# Patient Record
Sex: Female | Born: 1960 | Race: Black or African American | Hispanic: No | Smoking: Never smoker
Health system: Southern US, Community
[De-identification: ages and names within clinical notes are randomized; demographics above are authoritative.]

## PROBLEM LIST (undated history)

## (undated) DIAGNOSIS — I10 Essential (primary) hypertension: Secondary | ICD-10-CM

## (undated) DIAGNOSIS — M51369 Other intervertebral disc degeneration, lumbar region without mention of lumbar back pain or lower extremity pain: Secondary | ICD-10-CM

## (undated) DIAGNOSIS — E119 Type 2 diabetes mellitus without complications: Secondary | ICD-10-CM

## (undated) DIAGNOSIS — K219 Gastro-esophageal reflux disease without esophagitis: Secondary | ICD-10-CM

## (undated) DIAGNOSIS — G629 Polyneuropathy, unspecified: Secondary | ICD-10-CM

## (undated) DIAGNOSIS — M5136 Other intervertebral disc degeneration, lumbar region: Secondary | ICD-10-CM

## (undated) DIAGNOSIS — M1990 Unspecified osteoarthritis, unspecified site: Secondary | ICD-10-CM

## (undated) HISTORY — PX: COLONOSCOPY: SHX174

## (undated) HISTORY — PX: OTHER SURGICAL HISTORY: SHX169

## (undated) HISTORY — DX: Gastro-esophageal reflux disease without esophagitis: K21.9

## (undated) HISTORY — DX: Type 2 diabetes mellitus without complications: E11.9

## (undated) HISTORY — DX: Polyneuropathy, unspecified: G62.9

## (undated) HISTORY — PX: EYE SURGERY: SHX253

## (undated) HISTORY — PX: ABDOMINAL HYSTERECTOMY: SHX81

## (undated) HISTORY — DX: Essential (primary) hypertension: I10

---

## 2003-01-21 DIAGNOSIS — E119 Type 2 diabetes mellitus without complications: Secondary | ICD-10-CM | POA: Insufficient documentation

## 2003-10-23 ENCOUNTER — Ambulatory Visit: Payer: Self-pay

## 2004-11-18 ENCOUNTER — Ambulatory Visit: Payer: Self-pay

## 2005-11-21 ENCOUNTER — Ambulatory Visit: Payer: Self-pay | Admitting: Unknown Physician Specialty

## 2005-12-04 ENCOUNTER — Ambulatory Visit: Payer: Self-pay

## 2006-12-07 ENCOUNTER — Ambulatory Visit: Payer: Self-pay

## 2006-12-08 ENCOUNTER — Ambulatory Visit: Payer: Self-pay

## 2007-01-16 ENCOUNTER — Emergency Department: Payer: Self-pay | Admitting: Emergency Medicine

## 2007-01-25 ENCOUNTER — Encounter: Payer: Self-pay | Admitting: Orthopedic Surgery

## 2007-02-21 ENCOUNTER — Encounter: Payer: Self-pay | Admitting: Orthopedic Surgery

## 2007-03-02 ENCOUNTER — Ambulatory Visit: Payer: Self-pay | Admitting: Orthopedic Surgery

## 2007-03-02 ENCOUNTER — Other Ambulatory Visit: Payer: Self-pay

## 2007-03-08 ENCOUNTER — Ambulatory Visit: Payer: Self-pay | Admitting: Orthopedic Surgery

## 2007-04-06 ENCOUNTER — Encounter: Payer: Self-pay | Admitting: Orthopedic Surgery

## 2007-04-21 ENCOUNTER — Encounter: Payer: Self-pay | Admitting: Orthopedic Surgery

## 2007-05-21 ENCOUNTER — Encounter: Payer: Self-pay | Admitting: Orthopedic Surgery

## 2007-06-21 ENCOUNTER — Encounter: Payer: Self-pay | Admitting: Orthopedic Surgery

## 2007-07-21 ENCOUNTER — Encounter: Payer: Self-pay | Admitting: Orthopedic Surgery

## 2008-02-15 ENCOUNTER — Ambulatory Visit: Payer: Self-pay

## 2008-03-20 ENCOUNTER — Ambulatory Visit: Payer: Self-pay | Admitting: Internal Medicine

## 2009-11-07 ENCOUNTER — Ambulatory Visit: Payer: Self-pay | Admitting: Family Medicine

## 2010-02-21 ENCOUNTER — Ambulatory Visit: Payer: Self-pay

## 2011-02-25 ENCOUNTER — Ambulatory Visit: Payer: Self-pay

## 2012-05-14 ENCOUNTER — Ambulatory Visit: Payer: Self-pay | Admitting: Gastroenterology

## 2012-11-22 ENCOUNTER — Ambulatory Visit: Payer: Self-pay | Admitting: Physician Assistant

## 2014-01-16 DIAGNOSIS — K219 Gastro-esophageal reflux disease without esophagitis: Secondary | ICD-10-CM | POA: Insufficient documentation

## 2015-03-23 LAB — HM PAP SMEAR: HM PAP: NEGATIVE

## 2015-03-23 LAB — HM MAMMOGRAPHY: HM Mammogram: NORMAL (ref 0–4)

## 2015-04-10 ENCOUNTER — Encounter: Payer: Self-pay | Admitting: Dietician

## 2015-04-10 ENCOUNTER — Encounter: Payer: BLUE CROSS/BLUE SHIELD | Attending: Internal Medicine | Admitting: Dietician

## 2015-04-10 VITALS — BP 146/92 | Ht 59.0 in | Wt 135.6 lb

## 2015-04-10 DIAGNOSIS — E119 Type 2 diabetes mellitus without complications: Secondary | ICD-10-CM

## 2015-04-10 DIAGNOSIS — Z794 Long term (current) use of insulin: Secondary | ICD-10-CM

## 2015-04-10 NOTE — Patient Instructions (Addendum)
  Check blood sugars 4 x day before each meal and before bed every day and record Exercise: try walking  for    10-15  minutes  2-3  days a week but  ONLY if BG's <250 Avoid sugar sweetened drinks (soda, tea, coffee, sports drinks, juices) Limit intake of sweets/desserts, snack foods and fried foods Make healthy food choices Eat 3 meals day,   1-2 snacks a day Include 2-3 carbohydrate servings/meal + protein Include 1 carbohydrate serving/snack + protein Space meals 4-5 hours apart Complete 3 Day Food Record and bring to next appt Carry candy of glucose tablets at all times Carry medical alert ID  Complete a 3 day food record and bring to next appointment Make a  Dentist appointment Bring blood sugar records to the next appointment/class Call your doctor for a prescription for:  1. Meter strips (type)  Contour Next test strips checking  4 times per day  2. Lancets (type) Microlet lancets checking  4  times per day Carry fast acting glucose and a snack at all times Rotate injection sites Call [place] with BG's on 04-16-15 Return for appointment/classes on: 04-24-15

## 2015-04-10 NOTE — Progress Notes (Signed)
Diabetes Self-Management Education  Visit Type: First/Initial  Appt. Start Time: 1600 Appt. End Time: 1700  04/10/2015  [name], identified by name and date of birth, is a 55 y.o. female with a diagnosis of Diabetes: Type 2.   ASSESSMENT  Blood pressure 146/92, height 4\' 11"  (1.499 m), weight 135 lb 9.6 oz (61.508 kg). Body mass index is 27.37 kg/(m^2).      Diabetes Self-Management Education - 04/10/15 1754    Visit Information   Visit Type First/Initial   Initial Visit   Diabetes Type Type 2   Health Coping   How would you rate your overall health? Fair   Psychosocial Assessment   Patient Belief/Attitude about Diabetes Motivated to manage diabetes   Self-care barriers None   Patient Concerns Nutrition/Meal planning;Glycemic Control;Weight Control;Healthy Lifestyle  prevent complications   Preferred Learning Style Hands on   Learning Readiness Ready   What is the last grade level you completed in school? 12   Complications   Last HgB A1C per patient/outside source 12.8 %  03-29-15   How often do you check your blood sugar? 0 times/day (not testing)   Have you had a dilated eye exam in the past 12 months? Yes  03-2015   Have you had a dental exam in the past 12 months? No  5 yrs ago   Are you checking your feet? Yes   How many days per week are you checking your feet? 7   Dietary Intake   Breakfast --  eats breakfast at 9am break while at work (oatmeal or biscuit)   Snack (morning) --  eats occasional snack of chips or nabs at 8am prior to breakfast   Lunch --  eats lunch at 12p   Snack (afternoon) --  snack at [employer] --  eats supper at [employer] (evening) --  none   Beverage(s) --  Drinks occasional water and sweet tea; drinks 4-5 sugar free drinks/day   Exercise   Exercise Type ADL's  no regular exercise   Patient Education   Previous Diabetes Education Yes (please comment)  [place] about 5 yrs ago when diagnosed with  diabetes   Disease state  --  pathophysiology of type 2 diabetes and treatment options   Nutrition management  Role of diet in the treatment of diabetes and the relationship between the three main macronutrients and blood glucose level;Food label reading, portion sizes and measuring food.;Carbohydrate counting;Meal options for control of blood glucose level and chronic complications.;Effects of alcohol on blood glucose and safety factors with consumption of alcohol.;Meal timing in regards to the patients' current diabetes medication.   Physical activity and exercise  Role of exercise on diabetes management, blood pressure control and cardiac health.  advised no exercise unless BG<250 and always carry glucose tabs or candy; encouraged to start walking 10-15 min 2-3x/wk.   Medications Taught/reviewed insulin injection, site rotation, insulin storage and needle disposal.;Reviewed patients medication for diabetes, action, purpose, timing of dose and side effects.;Reviewed medication adjustment guidelines for hyperglycemia and sick days.   Monitoring Taught/evaluated SMBG meter.;Purpose and frequency of SMBG.;Identified appropriate SMBG and/or A1C goals.;Taught/discussed recording of test results and interpretation of SMBG.;Yearly dilated eye exam  gave pt Contour Next meter and instructed on its use. BG 280 (3 hr pp)   Acute complications Taught treatment of hypoglycemia - the 15 rule.;Discussed and identified patients' treatment of hyperglycemia.;Covered sick day management with medication and food.   Chronic complications Relationship between  chronic complications and blood glucose control;Assessed and discussed foot care and prevention of foot problems;Dental care;Retinopathy and reason for yearly dilated eye exams;Lipid levels, blood glucose control and heart disease;Reviewed with patient heart disease, higher risk of, and prevention   Psychosocial adjustment Role of stress on diabetes   Personal  strategies to promote health Lifestyle issues that need to be addressed for better diabetes care      Individualized Plan for Diabetes Self-Management Training:   Learning Objective:  Patient will have a greater understanding of diabetes self-management. Patient education plan is to attend individual and/or group sessions per assessed needs and concerns.   Plan:   Patient Instructions   Check blood sugars 4 x day before each meal and before bed every day and record Exercise: try walking  for    10-15  minutes  2-3  days a week but  ONLY if BG's <250 Avoid sugar sweetened drinks (soda, tea, coffee, sports drinks, juices) Limit intake of sweets/desserts, snack foods and fried foods Make healthy food choices Eat 3 meals day,   2 snacks a day in afternoon and at bedtime Include 2-3 carbohydrate servings/meal + protein Include 1 carbohydrate serving/snack + protein Space meals 4-5 hours apart Complete 3 Day Food Record and bring to next appt Carry candy of glucose tablets at all times Carry medical alert ID  Complete a 3 day food record and bring to next appointment Make a  Dentist appointment Bring blood sugar records to the next appointment/class Call your doctor for a prescription for:  1. Meter strips (type)  Contour Next test strips checking  4 times per day  2. Lancets (type) Microlet lancets checking  4  times per day Carry fast acting glucose and a snack at all times Rotate injection sites Call [place] with BG's on 04-16-15 Return for appointment/classes on: 04-24-15   Expected Outcomes:   positive  Education material provided: Diabetes and You booklet, Foot care handout, Know Your Numbers, Low BG handout, 1 tube of glucose tablets for PRN use, Contour Next meter, medical alert ID card  If problems or questions, patient to contact team via: [phone]  Future DSME appointment:  04-24-15

## 2015-04-16 ENCOUNTER — Telehealth: Payer: Self-pay | Admitting: Dietician

## 2015-04-16 NOTE — Telephone Encounter (Signed)
Called pt-pt reports she is not testing BG's but is taking insulin. Pt got prescription for the Contour Next test strips but they are expensive. Pt remembers old meter is a One Touch but it's not working. Offered to provide a One Touch 2 meter if pt prefers it and advised pt to call her insurance company to request the preferred meter.

## 2015-04-24 ENCOUNTER — Encounter: Payer: Self-pay | Admitting: Dietician

## 2015-04-24 ENCOUNTER — Encounter: Payer: BLUE CROSS/BLUE SHIELD | Attending: Internal Medicine | Admitting: Dietician

## 2015-04-24 VITALS — Ht 59.0 in | Wt 133.3 lb

## 2015-04-24 DIAGNOSIS — E119 Type 2 diabetes mellitus without complications: Secondary | ICD-10-CM | POA: Insufficient documentation

## 2015-04-24 DIAGNOSIS — Z794 Long term (current) use of insulin: Secondary | ICD-10-CM

## 2015-04-24 NOTE — Progress Notes (Signed)
Diabetes Self-Management Education  Visit Type:  Follow-up  Appt. Start Time: 1600 Appt. End Time: 1700  04/24/2015  Ms. Devinee Ting, identified by name and date of birth, is a 55 y.o. female with a diagnosis of Diabetes:  .   ASSESSMENT  Height 4\' 11"  (1.499 m), weight 133 lb 4.8 oz (60.464 kg). Body mass index is 26.91 kg/(m^2).       Diabetes Self-Management Education - [id number]    Complications   Last HgB A1C per patient/outside source 12.8 %   How often do you check your blood sugar? 3-4 times/day   Fasting Blood glucose range (mg/dL) >200  All readings before meals are in 200's-300's.   Have you had a dilated eye exam in the past 12 months? Yes   Have you had a dental exam in the past 12 months? No   Are you checking your feet? Yes   How many days per week are you checking your feet? 7   Dietary Intake   Breakfast 6:15am oatmeal or a waffle or a piece of smoked sausage   Snack (morning) yogurt or chips    Lunch 12:00 Baked chicken wings, green beans or pork chop, 1/2 cup macaroni/cheese or chicken salad sandwich, chips   Snack (afternoon) peanuts or nabs or small bag of sunchips   Dinner 8:00pm- chicken livers, fried potatoes/onions or cubed steak, 1/2 c.mashed potatoes/gravy    Beverage(s) water, sugar-free drinks   Exercise   Exercise Type ADL's  recommended not to exercise until blood glucose is lower   Patient Education   Nutrition management  Role of diet in the treatment of diabetes and the relationship between the three main macronutrients and blood glucose level;Food label reading, portion sizes and measuring food.;Carbohydrate counting;Reviewed blood glucose goals for pre and post meals and how to evaluate the patients' food intake on their blood glucose level.   Medications Reviewed patients medication for diabetes, action, purpose, timing of dose and side effects.   Outcomes   Program Status Completed      Learning Objective:  Patient will have a  greater understanding of diabetes self-management. Patient education plan is to attend individual and/or group sessions per assessed needs and concerns.   Plan:   Patient Instructions  Balance meals with 1-3 oz. Protein, 2-3 servings of carbohydrate (starch, fruit, milk/yogurt) and non-starchy vegetables. Spread 8-9 servings of carbohydrate over 3 meals and 1-2 snacks. Measure some portions of carbohydrate foods, especially starches. Read labels for carbohydrate grams remembering that 15 gms of carbohydrate equals one serving.     Expected Outcomes:  Demonstrated interest in learning. Expect positive outcomes  Education material provided: Planning a Balanced Meal; Diabetes Food Guide Plate If problems or questions, patient to contact team via:  Karolee Stamps, [place], [place]  Future DSME appointment: - PRN

## 2015-04-24 NOTE — Patient Instructions (Signed)
Balance meals with 1-3 oz. Protein, 2-3 servings of carbohydrate (starch, fruit, milk/yogurt) and non-starchy vegetables. Spread 8-9 servings of carbohydrate over 3 meals and 1-2 snacks. Measure some portions of carbohydrate foods, especially starches. Read labels for carbohydrate grams remembering that 15 gms of carbohydrate equals one serving.

## 2015-06-29 DIAGNOSIS — M792 Neuralgia and neuritis, unspecified: Secondary | ICD-10-CM | POA: Insufficient documentation

## 2015-08-10 ENCOUNTER — Other Ambulatory Visit: Payer: Self-pay | Admitting: Advanced Practice Midwife

## 2015-08-10 DIAGNOSIS — N644 Mastodynia: Secondary | ICD-10-CM

## 2015-08-23 ENCOUNTER — Encounter: Payer: Self-pay | Admitting: Radiology

## 2015-08-23 ENCOUNTER — Ambulatory Visit
Admission: RE | Admit: 2015-08-23 | Discharge: 2015-08-23 | Disposition: A | Discharge status: Home / Self Care | Payer: BLUE CROSS/BLUE SHIELD | Source: Ambulatory Visit | Attending: Advanced Practice Midwife | Admitting: Advanced Practice Midwife

## 2015-08-23 DIAGNOSIS — N644 Mastodynia: Secondary | ICD-10-CM | POA: Insufficient documentation

## 2015-10-16 DIAGNOSIS — M5432 Sciatica, left side: Secondary | ICD-10-CM | POA: Insufficient documentation

## 2015-10-16 DIAGNOSIS — G5712 Meralgia paresthetica, left lower limb: Secondary | ICD-10-CM | POA: Insufficient documentation

## 2016-04-10 ENCOUNTER — Ambulatory Visit
Admission: EM | Admit: 2016-04-10 | Discharge: 2016-04-10 | Disposition: A | Discharge status: Home / Self Care | Payer: BLUE CROSS/BLUE SHIELD | Attending: Family Medicine | Admitting: Family Medicine

## 2016-04-10 DIAGNOSIS — J0100 Acute maxillary sinusitis, unspecified: Secondary | ICD-10-CM | POA: Diagnosis not present

## 2016-04-10 DIAGNOSIS — T7840XA Allergy, unspecified, initial encounter: Secondary | ICD-10-CM

## 2016-04-10 MED ORDER — DOXYCYCLINE HYCLATE 100 MG PO CAPS: 100.0000 mg | ORAL_CAPSULE | Freq: Two times a day (BID) | ORAL | 0 refills | Status: DC

## 2016-04-10 NOTE — Discharge Instructions (Signed)
Take medication as prescribed. Rest. Drink plenty of fluids.  ° °Follow up with your primary care physician this week as needed. Return to Urgent care for new or worsening concerns.  ° °

## 2016-04-10 NOTE — ED Triage Notes (Signed)
Pt says she has a sinus infection, and some burning and pain in her left side.

## 2016-04-10 NOTE — ED Provider Notes (Signed)
[facility] ____________________________________________  Time seen: Approximately 11:54 AM  I have reviewed the triage vital signs and the nursing notes.   HISTORY  Chief Complaint Sinusitis   HPI Charlene [name] is a 56 y.o. female presenting for the complaints of 1 week of runny nose, nasal congestion, sinus pressure and postnasal drainage. Patient reports prior to the last week of symptoms, she reports she was having some nasal congestion similar to seasonal allergies. States occasionally scratchy throat, denies current sore throat. Denies fevers. Reports intermittent cough. States sinus discomfort around her cheeks and her forehead. Reports overall continues to eat and drink well as well as remain active. Reports unresolved over-the-counter cough and congestion medications. Patient reports thick drainage from nose and postnasal drainage that is green.  Denies chest pain, shortness of breath, abdominal pain, dysuria, extremity pain, extremity swelling or rash. Denies recent sickness. Denies recent antibiotic use. Patient states that she was treated with oral amoxicillin approximately 1-2 months ago for bronchitis.  PCP: [facility]   Past Medical History:  Diagnosis Date  . GERD (gastroesophageal reflux disease)   . Hypertension   Diabetes Back pain   There are no active problems to display for this patient.   History reviewed. No pertinent surgical history.   No current facility-administered medications for this encounter.   Current Outpatient Prescriptions:  .  cyclobenzaprine (FLEXERIL) 5 MG tablet, Take 1 tablet by mouth 2 (two) times daily. As needed, Disp: , Rfl:  .  gabapentin (NEURONTIN) 100 MG capsule, Take 100 mg by mouth 2 (two) times daily., Disp: , Rfl:  .  insulin aspart (NOVOLOG PENFILL) cartridge, Inject 5 Units into the skin 3 (three) times daily before meals., Disp: , Rfl:  .  Insulin Glargine (LANTUS SOLOSTAR) 100 UNIT/ML Solostar Pen, Inject 70  Units into the skin daily. At bedtime, Disp: , Rfl:  .  lisinopril-hydrochlorothiazide (PRINZIDE,ZESTORETIC) 20-12.5 MG tablet, Take 1 tablet by mouth daily., Disp: , Rfl:  .  omeprazole (PRILOSEC) 20 MG capsule, Take 1 capsule by mouth daily. As needed for acid reflux, Disp: , Rfl:  .  potassium chloride SA (K-DUR,KLOR-CON) 20 MEQ tablet, Take 1 tablet by mouth daily. As needed, Disp: , Rfl:  .  doxycycline (VIBRAMYCIN) 100 MG capsule, Take 1 capsule (100 mg total) by mouth 2 (two) times daily., Disp: 20 capsule, Rfl: 0  Allergies Patient has no known allergies.  Family History  Problem Relation Age of Onset  . Breast cancer Maternal Aunt     Social History Social History  Substance Use Topics  . Smoking status: Never Smoker  . Smokeless tobacco: Never Used  . Alcohol use 0.0 - 0.6 oz/week    Review of Systems Constitutional: No fever/chills Eyes: No visual changes. ENT: As above. Cardiovascular: Denies chest pain. Respiratory: Denies shortness of breath. Gastrointestinal: No abdominal pain.  No nausea, no vomiting.  No diarrhea.  No constipation. Genitourinary: Negative for dysuria. Musculoskeletal: Negative for back pain. Skin: Negative for rash. Neurological: Negative for headaches, focal weakness or numbness.  10-point ROS otherwise negative.  ____________________________________________   PHYSICAL EXAM:  VITAL SIGNS: ED Triage Vitals  Enc Vitals Group     BP 04/10/16 1035 (!) 169/87     Pulse Rate 04/10/16 1035 86     Resp 04/10/16 1035 18     Temp 04/10/16 1035 98 F (36.7 C)     Temp Source 04/10/16 1035 Oral     SpO2 04/10/16 1035 100 %     Weight  04/10/16 1034 125 lb (56.7 kg)     Height 04/10/16 1034 4\' 11"  (1.499 m)     Head Circumference --      Peak Flow --      Pain Score 04/10/16 1036 6     Pain Loc --      Pain Edu? --      Excl. in [place]? --     Constitutional: Alert and oriented. Well appearing and in no acute distress. Eyes: Conjunctivae  are normal. PERRL. EOMI. Head: Atraumatic.Mild tenderness to palpation bilateral maxillary sinuses, no frontal sinus tenderness to palpation.. No swelling. No erythema.   Ears: no erythema, normal TMs bilaterally.   Nose: nasal congestion with bilateral nasal turbinate erythema and edema.   Mouth/Throat: Mucous membranes are moist.  Oropharynx non-erythematous.No tonsillar swelling or exudate.  Neck: No stridor.  No cervical spine tenderness to palpation. Hematological/Lymphatic/Immunilogical: No cervical lymphadenopathy. Cardiovascular: Normal rate, regular rhythm. Grossly normal heart sounds.  Good peripheral circulation. Respiratory: Normal respiratory effort.  No retractions. No wheezes, rales or rhonchi. Good air movement.  Gastrointestinal: Soft and nontender. Musculoskeletal: No cervical, thoracic or lumbar tenderness to palpation.  Neurologic:  Normal speech and language. No gait instability. Skin:  Skin is warm, dry and intact. No rash noted. Psychiatric: Mood and affect are normal. Speech and behavior are normal.  ___________________________________________   LABS (all labs ordered are listed, but only abnormal results are displayed)  Labs Reviewed - No data to display ____________________________________________   PROCEDURES Procedures   INITIAL IMPRESSION / ASSESSMENT AND PLAN / ED COURSE  Pertinent labs & imaging results that were available during my care of the patient were reviewed by me and considered in my medical decision making (see chart for details).  Well appearing patient. No acute distress. Suspect seasonal allergies with secondary maxillary sinusitis. Encourage rest, fluids and supportive care. Discussed starting daily antihistamine like Claritin or Zyrtec. Will start patient on oral doxycycline. Discussed nasal saline rinses and avoid triggers.Discussed indication, risks and benefits of medications with patient.  Discussed follow up with Primary care  physician this week. Discussed follow up and return parameters including no resolution or any worsening concerns. Patient verbalized understanding and agreed to plan.   ____________________________________________   FINAL CLINICAL IMPRESSION(S) / ED DIAGNOSES  Final diagnoses:  Acute maxillary sinusitis, recurrence not specified  Allergic disorder, initial encounter     Discharge Medication List as of 04/10/2016 12:06 PM    START taking these medications   Details  doxycycline (VIBRAMYCIN) 100 MG capsule Take 1 capsule (100 mg total) by mouth 2 (two) times daily., Starting Thu 04/10/2016, Normal        Note: This dictation was prepared with Dragon dictation along with smaller phrase technology. Any transcriptional errors that result from this process are unintentional.         Marylene Land, NP 04/10/16 1840

## 2016-08-22 ENCOUNTER — Encounter: Payer: Self-pay | Admitting: *Deleted

## 2016-08-22 ENCOUNTER — Emergency Department
Admission: EM | Admit: 2016-08-22 | Discharge: 2016-08-22 | Disposition: A | Discharge status: Home / Self Care | Payer: Self-pay | Attending: Emergency Medicine | Admitting: Emergency Medicine

## 2016-08-22 ENCOUNTER — Emergency Department: Payer: Self-pay

## 2016-08-22 DIAGNOSIS — I10 Essential (primary) hypertension: Secondary | ICD-10-CM | POA: Insufficient documentation

## 2016-08-22 DIAGNOSIS — R109 Unspecified abdominal pain: Secondary | ICD-10-CM

## 2016-08-22 DIAGNOSIS — R739 Hyperglycemia, unspecified: Secondary | ICD-10-CM

## 2016-08-22 DIAGNOSIS — R1031 Right lower quadrant pain: Secondary | ICD-10-CM | POA: Insufficient documentation

## 2016-08-22 DIAGNOSIS — Z794 Long term (current) use of insulin: Secondary | ICD-10-CM | POA: Insufficient documentation

## 2016-08-22 DIAGNOSIS — E1165 Type 2 diabetes mellitus with hyperglycemia: Secondary | ICD-10-CM | POA: Insufficient documentation

## 2016-08-22 DIAGNOSIS — R1011 Right upper quadrant pain: Secondary | ICD-10-CM | POA: Insufficient documentation

## 2016-08-22 LAB — URINALYSIS, COMPLETE (UACMP) WITH MICROSCOPIC
BACTERIA UA: NONE SEEN
BILIRUBIN URINE: NEGATIVE
Hgb urine dipstick: NEGATIVE
Ketones, ur: NEGATIVE mg/dL
Leukocytes, UA: NEGATIVE
NITRITE: NEGATIVE
PH: 7 (ref 5.0–8.0)
Protein, ur: NEGATIVE mg/dL
SPECIFIC GRAVITY, URINE: 1.024 (ref 1.005–1.030)

## 2016-08-22 LAB — CBC
HEMATOCRIT: 41.3 % (ref 35.0–47.0)
HEMOGLOBIN: 13.7 g/dL (ref 12.0–16.0)
MCH: 29 pg (ref 26.0–34.0)
MCHC: 33.3 g/dL (ref 32.0–36.0)
MCV: 87.1 fL (ref 80.0–100.0)
Platelets: 300 10*3/uL (ref 150–440)
RBC: 4.74 MIL/uL (ref 3.80–5.20)
RDW: 13 % (ref 11.5–14.5)
WBC: 8.4 10*3/uL (ref 3.6–11.0)

## 2016-08-22 LAB — BASIC METABOLIC PANEL
ANION GAP: 12 (ref 5–15)
BUN: 8 mg/dL (ref 6–20)
CHLORIDE: 96 mmol/L — AB (ref 101–111)
CO2: 26 mmol/L (ref 22–32)
Calcium: 9.7 mg/dL (ref 8.9–10.3)
Creatinine, Ser: 0.62 mg/dL (ref 0.44–1.00)
GFR calc non Af Amer: >60 mL/min (ref >60)
GLUCOSE: 513 mg/dL — AB (ref 65–99)
Potassium: 3.4 mmol/L — ABNORMAL LOW (ref 3.5–5.1)
Sodium: 134 mmol/L — ABNORMAL LOW (ref 135–145)

## 2016-08-22 LAB — GLUCOSE, CAPILLARY
GLUCOSE-CAPILLARY: 292 mg/dL — AB (ref 65–99)
Glucose-Capillary: 435 mg/dL — ABNORMAL HIGH (ref 65–99)

## 2016-08-22 MED ORDER — SODIUM CHLORIDE 0.9 % IV BOLUS (SEPSIS)
1000.0000 mL | Freq: Once | INTRAVENOUS | Status: AC
  Administered 2016-08-22: 1000 mL via INTRAVENOUS

## 2016-08-22 MED ORDER — INSULIN ASPART 100 UNIT/ML ~~LOC~~ SOLN: 5.0000 [IU] | Freq: Once | SUBCUTANEOUS | Status: DC

## 2016-08-22 MED ORDER — SODIUM CHLORIDE 0.9 % IV BOLUS (SEPSIS): 1000.0000 mL | Freq: Once | INTRAVENOUS | Status: DC

## 2016-08-22 MED ORDER — POTASSIUM CHLORIDE CRYS ER 20 MEQ PO TBCR
40.0000 meq | EXTENDED_RELEASE_TABLET | Freq: Once | ORAL | Status: DC
  Filled 2016-08-22: qty 2

## 2016-08-22 NOTE — ED Provider Notes (Addendum)
[facility] Emergency Department Provider Note  ____________________________________________   First MD Initiated Contact with Patient 08/22/16 1832     (approximate)  I have reviewed the triage vital signs and the nursing notes.   HISTORY  Chief Complaint Flank Pain   HPI Charlene [name] is a 56 y.o. female with a history of GERD, hypertension and diabetes who is presenting with right flank pain over the past several months. She says the pain is been increasing in frequency and intensity and this is why she came to the emergency department. She denies any nausea vomiting or diarrhea. Denies any pain with urination. Says that she doesn't a history of right-sided ovarian cysts which she needed removed in the past.No history of kidney stones. Says that the pain is worse with movement. Says that the pain is an 8-910 at this time and sharp and cramping in quality. Says the pain is localized mostly to the right lower quadrant and radiates around to the right flank.  Patient says that she has her diabetes medications at home but that she has not been using them for multiple days.   Past Medical History:  Diagnosis Date  . GERD (gastroesophageal reflux disease)   . Hypertension     There are no active problems to display for this patient.   History reviewed. No pertinent surgical history.  Prior to Admission medications   Medication Sig Start Date End Date Taking? Authorizing Provider  cyclobenzaprine (FLEXERIL) 5 MG tablet Take 1 tablet by mouth 2 (two) times daily. As needed 04/17/14   [provider]  doxycycline (VIBRAMYCIN) 100 MG capsule Take 1 capsule (100 mg total) by mouth 2 (two) times daily. Patient not taking: Reported on 08/22/2016 04/10/16   Marylene Land, NP  gabapentin (NEURONTIN) 100 MG capsule Take 100 mg by mouth 2 (two) times daily.    [provider]  insulin aspart (NOVOLOG PENFILL) cartridge Inject 5 Units into the skin  3 (three) times daily before meals. 03/29/15   [provider]  Insulin Glargine (LANTUS SOLOSTAR) 100 UNIT/ML Solostar Pen Inject 70 Units into the skin daily. At bedtime 03/29/15   [provider]  lisinopril-hydrochlorothiazide (PRINZIDE,ZESTORETIC) 20-12.5 MG tablet Take 1 tablet by mouth daily. 03/29/15   [provider]  omeprazole (PRILOSEC) 20 MG capsule Take 1 capsule by mouth daily. As needed for acid reflux    [provider]  potassium chloride SA (K-DUR,KLOR-CON) 20 MEQ tablet Take 1 tablet by mouth daily. As needed    [provider]    Allergies Patient has no known allergies.  Family History  Problem Relation Age of Onset  . Breast cancer Maternal Aunt     Social History Social History  Substance Use Topics  . Smoking status: Never Smoker  . Smokeless tobacco: Never Used  . Alcohol use 0.0 - 0.6 oz/week    Review of Systems  Constitutional: No fever/chills Eyes: No visual changes. ENT: No sore throat. Cardiovascular: Denies chest pain. Respiratory: Denies shortness of breath. Gastrointestinal:  No nausea, no vomiting.  No diarrhea.  No constipation. Genitourinary: Negative for dysuria. Musculoskeletal: as above Skin: Negative for rash. Neurological: Negative for headaches, focal weakness or numbness.   ____________________________________________   PHYSICAL EXAM:  VITAL SIGNS: ED Triage Vitals [08/22/16 1731]  Enc Vitals Group     BP (!) 198/109     Pulse Rate 93     Resp 18     Temp 98.2 F (36.8 C)  Temp Source Oral     SpO2 100 %     Weight 125 lb (56.7 kg)     Height 4\' 11"  (1.499 m)     Head Circumference      Peak Flow      Pain Score 9     Pain Loc      Pain Edu?      Excl. in [place]?     Constitutional: Alert and oriented. Well appearing and in no acute distress. Eyes: Conjunctivae are normal.  Head: Atraumatic. Nose: No congestion/rhinnorhea. Mouth/Throat: Mucous membranes are moist.    Neck: No stridor.   Cardiovascular: Normal rate, regular rhythm. Grossly normal heart sounds.   Respiratory: Normal respiratory effort.  No retractions. Lungs CTAB. Gastrointestinal: Soft with mild right lower quadrant tenderness. Patient with minimal right upper quadrant tenderness with a negative Murphy sign. No distention. Mild right cva tenderness palpation. Musculoskeletal: No lower extremity tenderness nor edema.  No joint effusions. Neurologic:  Normal speech and language. No gross focal neurologic deficits are appreciated. Skin:  Skin is warm, dry and intact. No rash noted. Psychiatric: Mood and affect are normal. Speech and behavior are normal.  ____________________________________________   LABS (all labs ordered are listed, but only abnormal results are displayed)  Labs Reviewed  URINALYSIS, COMPLETE (UACMP) WITH MICROSCOPIC - Abnormal; Notable for the following:       Result Value   Color, Urine STRAW (*)    APPearance CLEAR (*)    Glucose, UA >=500 (*)    Squamous Epithelial / LPF 0-5 (*)    All other components within normal limits  BASIC METABOLIC PANEL - Abnormal; Notable for the following:    Sodium 134 (*)    Potassium 3.4 (*)    Chloride 96 (*)    Glucose, Bld 513 (*)    All other components within normal limits  GLUCOSE, CAPILLARY - Abnormal; Notable for the following:    Glucose-Capillary 435 (*)    All other components within normal limits  CBC  CBG MONITORING, ED   ____________________________________________  EKG   ____________________________________________  RADIOLOGY  Patient without any acute process on her CT of the abdomen and pelvis. ____________________________________________   PROCEDURES  Procedure(s) performed:   Procedures  Critical Care performed:   ____________________________________________   INITIAL IMPRESSION / ASSESSMENT AND PLAN / ED COURSE  Pertinent labs & imaging results that were available during my care of  the patient were reviewed by me and considered in my medical decision making (see chart for details).  ----------------------------------------- 9:05 PM on 08/22/2016 -----------------------------------------  Patient without any acute process in the CAT scan of the abdomen and pelvis. Liver lesion that may be a vascular confluence versus an area of scarring found and a follow-up MRI is recommended. Patient is resting, but this time. Repeat glucose is 292. I discussed the findings with the patient and she'll be following up with her primary care doctor for further evaluation and treatment. She says that she'll begin taking her medication at home for diabetes as we discussed extensively the risks of high blood sugar. Patient is understanding the plan and willing to comply. Also says that she will be trying a salve such as aspirin or icy hot at home for the flank pain which is worse with movement. No rashes noted. No vesicular lesions.      ____________________________________________   FINAL CLINICAL IMPRESSION(S) / ED DIAGNOSES  Abdominal pain. Flank pain. Hyperglycemia.    NEW MEDICATIONS STARTED DURING THIS VISIT:  New Prescriptions   No medications on file     Note:  This document was prepared using Dragon voice recognition software and may include unintentional dictation errors.     Orbie Pyo, MD 08/22/16 2107  Patient with ongoing pain for months. Normal blood cell count. No GI symptoms and normal-appearing appendix on the CAT scan. Unlikely to be appendicitis. Furthermore, there were no pelvic masses found. Unlikely to be ovarian torsion or cyst.   Orbie Pyo, MD 08/22/16 2108

## 2016-08-22 NOTE — ED Triage Notes (Signed)
States right flank pain that goes to her low abd for several months, states pain is now increasing, denies any urinary symptoms, awake and alert in no acute distress

## 2016-11-21 ENCOUNTER — Ambulatory Visit: Payer: Self-pay | Admitting: Obstetrics and Gynecology

## 2016-11-25 ENCOUNTER — Other Ambulatory Visit: Payer: Self-pay | Admitting: Internal Medicine

## 2016-11-25 DIAGNOSIS — R932 Abnormal findings on diagnostic imaging of liver and biliary tract: Secondary | ICD-10-CM

## 2016-12-03 ENCOUNTER — Ambulatory Visit: Payer: Self-pay | Admitting: Obstetrics and Gynecology

## 2016-12-04 ENCOUNTER — Ambulatory Visit
Admission: RE | Admit: 2016-12-04 | Discharge: 2016-12-04 | Disposition: A | Discharge status: Home / Self Care | Payer: BLUE CROSS/BLUE SHIELD | Source: Ambulatory Visit | Attending: Internal Medicine | Admitting: Internal Medicine

## 2016-12-04 DIAGNOSIS — R932 Abnormal findings on diagnostic imaging of liver and biliary tract: Secondary | ICD-10-CM | POA: Insufficient documentation

## 2016-12-04 DIAGNOSIS — K7689 Other specified diseases of liver: Secondary | ICD-10-CM | POA: Insufficient documentation

## 2016-12-04 LAB — POCT I-STAT CREATININE: CREATININE: 0.4 mg/dL — AB (ref 0.44–1.00)

## 2016-12-04 MED ORDER — GADOBENATE DIMEGLUMINE 529 MG/ML IV SOLN
11.0000 mL | Freq: Once | INTRAVENOUS | Status: AC | PRN
  Administered 2016-12-04: 11 mL via INTRAVENOUS

## 2017-02-24 ENCOUNTER — Other Ambulatory Visit: Payer: Self-pay | Admitting: Internal Medicine

## 2017-02-24 DIAGNOSIS — Z1231 Encounter for screening mammogram for malignant neoplasm of breast: Secondary | ICD-10-CM

## 2017-03-30 ENCOUNTER — Ambulatory Visit
Admission: RE | Admit: 2017-03-30 | Discharge: 2017-03-30 | Disposition: A | Discharge status: Home / Self Care | Payer: BLUE CROSS/BLUE SHIELD | Source: Ambulatory Visit | Attending: Internal Medicine | Admitting: Internal Medicine

## 2017-03-30 DIAGNOSIS — Z1231 Encounter for screening mammogram for malignant neoplasm of breast: Secondary | ICD-10-CM | POA: Insufficient documentation

## 2017-04-07 ENCOUNTER — Other Ambulatory Visit: Payer: Self-pay | Admitting: Orthopedic Surgery

## 2017-04-07 DIAGNOSIS — M5431 Sciatica, right side: Secondary | ICD-10-CM

## 2017-04-14 ENCOUNTER — Ambulatory Visit
Admission: RE | Admit: 2017-04-14 | Discharge: 2017-04-14 | Disposition: A | Discharge status: Home / Self Care | Payer: BLUE CROSS/BLUE SHIELD | Source: Ambulatory Visit | Attending: Orthopedic Surgery | Admitting: Orthopedic Surgery

## 2017-04-14 DIAGNOSIS — M5431 Sciatica, right side: Secondary | ICD-10-CM | POA: Diagnosis present

## 2017-04-14 DIAGNOSIS — M47816 Spondylosis without myelopathy or radiculopathy, lumbar region: Secondary | ICD-10-CM | POA: Diagnosis not present

## 2017-04-14 DIAGNOSIS — M5136 Other intervertebral disc degeneration, lumbar region: Secondary | ICD-10-CM | POA: Insufficient documentation

## 2017-05-05 ENCOUNTER — Other Ambulatory Visit: Payer: Self-pay | Admitting: Internal Medicine

## 2017-05-05 DIAGNOSIS — E1165 Type 2 diabetes mellitus with hyperglycemia: Secondary | ICD-10-CM

## 2017-05-05 DIAGNOSIS — E1140 Type 2 diabetes mellitus with diabetic neuropathy, unspecified: Secondary | ICD-10-CM | POA: Insufficient documentation

## 2017-05-05 DIAGNOSIS — IMO0002 Reserved for concepts with insufficient information to code with codable children: Secondary | ICD-10-CM | POA: Insufficient documentation

## 2017-05-08 ENCOUNTER — Other Ambulatory Visit: Payer: Self-pay | Admitting: Internal Medicine

## 2017-05-08 DIAGNOSIS — M79605 Pain in left leg: Secondary | ICD-10-CM

## 2017-05-08 DIAGNOSIS — R600 Localized edema: Secondary | ICD-10-CM

## 2017-05-18 ENCOUNTER — Ambulatory Visit
Admission: RE | Admit: 2017-05-18 | Discharge: 2017-05-18 | Disposition: A | Discharge status: Home / Self Care | Payer: BLUE CROSS/BLUE SHIELD | Source: Ambulatory Visit | Attending: Internal Medicine | Admitting: Internal Medicine

## 2017-05-18 ENCOUNTER — Ambulatory Visit: Payer: BLUE CROSS/BLUE SHIELD

## 2017-05-18 DIAGNOSIS — R600 Localized edema: Secondary | ICD-10-CM | POA: Diagnosis present

## 2017-05-18 DIAGNOSIS — M79605 Pain in left leg: Secondary | ICD-10-CM | POA: Insufficient documentation

## 2017-05-29 DIAGNOSIS — G63 Polyneuropathy in diseases classified elsewhere: Secondary | ICD-10-CM | POA: Insufficient documentation

## 2017-06-24 DIAGNOSIS — M79605 Pain in left leg: Secondary | ICD-10-CM | POA: Insufficient documentation

## 2017-07-06 DIAGNOSIS — R208 Other disturbances of skin sensation: Secondary | ICD-10-CM | POA: Insufficient documentation

## 2017-07-06 DIAGNOSIS — R29898 Other symptoms and signs involving the musculoskeletal system: Secondary | ICD-10-CM | POA: Insufficient documentation

## 2017-07-06 DIAGNOSIS — R202 Paresthesia of skin: Secondary | ICD-10-CM

## 2017-07-06 DIAGNOSIS — R200 Anesthesia of skin: Secondary | ICD-10-CM | POA: Insufficient documentation

## 2017-08-11 DIAGNOSIS — G629 Polyneuropathy, unspecified: Secondary | ICD-10-CM | POA: Insufficient documentation

## 2017-10-12 ENCOUNTER — Encounter: Payer: Self-pay | Admitting: Nurse Practitioner

## 2017-10-12 ENCOUNTER — Ambulatory Visit: Payer: BLUE CROSS/BLUE SHIELD | Attending: Nurse Practitioner | Admitting: Nurse Practitioner

## 2017-10-12 ENCOUNTER — Other Ambulatory Visit: Payer: Self-pay

## 2017-10-12 VITALS — BP 186/93 | HR 80 | Temp 98.3°F | Ht 59.0 in | Wt 115.0 lb

## 2017-10-12 DIAGNOSIS — G894 Chronic pain syndrome: Secondary | ICD-10-CM

## 2017-10-12 DIAGNOSIS — M25562 Pain in left knee: Secondary | ICD-10-CM

## 2017-10-12 DIAGNOSIS — Z789 Other specified health status: Secondary | ICD-10-CM | POA: Insufficient documentation

## 2017-10-12 DIAGNOSIS — M79604 Pain in right leg: Secondary | ICD-10-CM | POA: Insufficient documentation

## 2017-10-12 DIAGNOSIS — Z79899 Other long term (current) drug therapy: Secondary | ICD-10-CM | POA: Insufficient documentation

## 2017-10-12 DIAGNOSIS — M79672 Pain in left foot: Secondary | ICD-10-CM | POA: Diagnosis not present

## 2017-10-12 DIAGNOSIS — E1165 Type 2 diabetes mellitus with hyperglycemia: Secondary | ICD-10-CM | POA: Insufficient documentation

## 2017-10-12 DIAGNOSIS — M25561 Pain in right knee: Secondary | ICD-10-CM

## 2017-10-12 DIAGNOSIS — M5136 Other intervertebral disc degeneration, lumbar region: Secondary | ICD-10-CM | POA: Insufficient documentation

## 2017-10-12 DIAGNOSIS — G8929 Other chronic pain: Secondary | ICD-10-CM | POA: Insufficient documentation

## 2017-10-12 DIAGNOSIS — K219 Gastro-esophageal reflux disease without esophagitis: Secondary | ICD-10-CM | POA: Diagnosis not present

## 2017-10-12 DIAGNOSIS — M899 Disorder of bone, unspecified: Secondary | ICD-10-CM | POA: Insufficient documentation

## 2017-10-12 DIAGNOSIS — G629 Polyneuropathy, unspecified: Secondary | ICD-10-CM | POA: Insufficient documentation

## 2017-10-12 DIAGNOSIS — M25572 Pain in left ankle and joints of left foot: Secondary | ICD-10-CM | POA: Diagnosis not present

## 2017-10-12 DIAGNOSIS — M5442 Lumbago with sciatica, left side: Secondary | ICD-10-CM | POA: Diagnosis not present

## 2017-10-12 DIAGNOSIS — M79605 Pain in left leg: Secondary | ICD-10-CM | POA: Insufficient documentation

## 2017-10-12 DIAGNOSIS — E1142 Type 2 diabetes mellitus with diabetic polyneuropathy: Secondary | ICD-10-CM | POA: Insufficient documentation

## 2017-10-12 DIAGNOSIS — I10 Essential (primary) hypertension: Secondary | ICD-10-CM | POA: Insufficient documentation

## 2017-10-12 DIAGNOSIS — F1190 Opioid use, unspecified, uncomplicated: Secondary | ICD-10-CM

## 2017-10-12 NOTE — Patient Instructions (Signed)

## 2017-10-12 NOTE — Progress Notes (Signed)
Patient's Name: Charlene [name]  MRN: [id number]  Referring Provider: Jannifer Franklin, NP  DOB: 06-18-1960  PCP: Tracie Harrier, MD  DOS: 10/12/2017  Note by: Dionisio David NP  Service setting: Ambulatory outpatient  Specialty: Interventional Pain Management  Location: [facility] (AMB) Pain Management Facility    Patient type: New Patient    Primary Reason(s) for Visit: Initial Patient Evaluation CC: Leg Pain (left)  HPI  Charlene [name] is a 57 y.o. year old, female patient, who comes today for an initial evaluation. She has Back pain with left-sided sciatica; Burning sensation of feet; DM type 2, uncontrolled, with neuropathy ([place]); GERD without esophagitis; Hypertension; Neuropathic pain; Neuropathy; Numbness and tingling of left leg; Pain of right leg; Polyneuropathy associated with underlying disease ([place]); Chronic pain of left ankle (Primary Area of Pain); Chronic foot pain, left (Secondary Area of Pain); Chronic pain of left lower extremity (Tertiary Area of Pain) (L>R); Chronic pain of both knees (Fourth Area of Pain) (L>R); Chronic bilateral low back pain with left-sided sciatica; Chronic pain syndrome; Opiate use; Pharmacologic therapy; Disorder of skeletal system; and Problems influencing health status on their problem list.. Her primarily concern today is the Leg Pain (left)  Pain Assessment: Location: Left Leg Radiating: pain radiaties down left leg, back and front of thigh and down calf to foot and toes Onset: More than a month ago Duration: Chronic pain Quality: Throbbing, Tingling, Aching, Burning, Constant Severity: 8 /10 (subjective, self-reported pain score)  Note: Reported level is compatible with observation. Clinically the patient looks like a 2/10 A 2/10 is viewed as "Mild to Moderate" and described as noticeable and distracting. Impossible to hide from other people. More frequent flare-ups. Still possible to adapt and function close to normal. It can be very annoying and may have  occasional stronger flare-ups. With discipline, patients may get used to it and adapt. Information on the proper use of the pain scale provided to the patient today. When using our objective Pain Scale, levels between 6 and 10/10 are said to belong in an emergency room, as it progressively worsens from a 6/10, described as severely limiting, requiring emergency care not usually available at an outpatient pain management facility. At a 6/10 level, communication becomes difficult and requires great effort. Assistance to reach the emergency department may be required. Facial flushing and profuse sweating along with potentially dangerous increases in heart rate and blood pressure will be evident. Effect on ADL: limits my daily activities Timing: Constant Modifying factors: medications, rest BP: (!) 186/93  HR: 80  Onset and Duration: Sudden and Date of onset: 5 months Cause of pain: Unknown Severity: Getting worse, NAS-11 at its worse: 8/10, NAS-11 at its best: 5/10, NAS-11 now: 8/10 and NAS-11 on the average: 8/10 Timing: Morning, Afternoon and After a period of immobility Aggravating Factors: Motion, Prolonged standing and Walking Alleviating Factors: Medications and Resting Associated Problems: Day-time cramps, Night-time cramps, Numbness, Spasms, Swelling, Tingling, Pain that wakes patient up and Pain that does not allow patient to sleep Quality of Pain: Aching, Burning, Constant, Cramping, Getting longer, Shooting, Tender, Throbbing, Tingling and Tiring Previous Examinations or Tests: X-rays and Nerve conduction test Previous Treatments: Narcotic medications  The patient comes into the clinics today for the first time for a chronic pain management evaluation.  According to the patient her primary area pain is in her left ankle and foot.  She denies any previous injury.  She admits that she does have peripheral neuropathy secondary to uncontrolled diabetes.  She  admits that she has constant  swelling in her left ankle with pain.  She has burning to the bottom of her feet.  She has tingling also in the foot.  She admits that she has throbbing in the toes.  She states that sometimes it affects all the toes but the great toe is the worse.  She admits that this has been going on since of March when she stopped working.  She admits that she did see Dr. Vickki Muff on last month and he gave her an injection to her ankle which she states was not effective.  She has had recent images of her foot and ankle.    Her second third area of pain is in her legs.  She admits the left is greater than the right.  She has weakness and cramps in her left thigh.  She states that that is the front in the back.  She has had a previous nerve conduction study.  Her third area of pain is in her lower back.  She admits that this is an occasional pain.  Is any previous surgery.  She did have previous injections with Dr. Sharlet Salina she does not feel like they were effective.  She is going to start physical therapy in 1 week.  She has had recent x-rays.  Last area of pain is in her knees.  She admits that the left is greater than the right.  She has pain she denies any swelling.  She denies any previous surgery, interventional therapy.  She does admit that she is to follow-up with Dr. Rudene Christians this month.  She is unsure she has had x-rays.  Today I took the time to provide the patient with information regarding this pain practice. The patient was informed that the practice is divided into two sections: an interventional pain management section, as well as a completely separate and distinct medication management section. I explained that there are procedure days for interventional therapies, and evaluation days for follow-ups and medication management. Because of the amount of documentation required during both, they are kept separated. This means that there is the possibility that she may be scheduled for a procedure on one day, and  medication management the next. I have also informed her that because of staffing and facility limitations, this practice will no longer take patients for medication management only. To illustrate the reasons for this, I gave the patient the example of surgeons, and how inappropriate it would be to refer a patient to his/her care, just to write for the post-surgical antibiotics on a surgery done by a different surgeon.   Because interventional pain management is part of the board-certified specialty for the doctors, the patient was informed that joining this practice means that they are open to any and all interventional therapies. I made it clear that this does not mean that they will be forced to have any procedures done. What this means is that I believe interventional therapies to be essential part of the diagnosis and proper management of chronic pain conditions. Therefore, patients not interested in these interventional alternatives will be better served under the care of a different practitioner.  The patient was also made aware of my Comprehensive Pain Management Safety Guidelines where by joining this practice, they limit all of their nerve blocks and joint injections to those done by our practice, for as long as we are retained to manage their care. Historic Controlled Substance Pharmacotherapy Review  PMP and historical list of controlled substances:  Tramadol 50 mg, hydrocodone/acetaminophen 5/325 mg Highest opioid analgesic regimen found: Hydrocodone/acetaminophen 5/325 mg 1 tablet 6 times daily for 3 days (fill date 03/07/2012) hydrocodone 30 mg/day Most recent opioid analgesic: Tramadol 50 mg 1 tablet 3 times daily (last fill date 09/01/2017) tramadol 150 mg/day Current opioid analgesics: Tramadol 50 mg 1 tablet 3 times daily (last fill date 09/01/2017) tramadol 150 mg/day Highest recorded MME/day: 67m/day MME/day:15 mg/day Medications: The patient did not bring the medication(s) to the  appointment, as requested in our "New Patient Package" Pharmacodynamics: Desired effects: Analgesia: The patient reports >50% benefit. Reported improvement in function: The patient reports medication allows her to accomplish basic ADLs. Clinically meaningful improvement in function (CMIF): Sustained CMIF goals met Perceived effectiveness: Described as relatively effective, allowing for increase in activities of daily living (ADL) Undesirable effects: Side-effects or Adverse reactions: None reported Historical Monitoring: The patient  reports that she does not use drugs. List of all UDS Test(s): No results found for: MDMA, COCAINSCRNUR, PCPSCRNUR, PCPQUANT, CANNABQUANT, THCU, EKoutsList of all Serum Drug Screening Test(s):  No results found for: AMPHSCRSER, BARBSCRSER, BENZOSCRSER, COCAINSCRSER, PCPSCRSER, PCPQUANT, THCSCRSER, CANNABQUANT, OPIATESCRSER, OXYSCRSER, PROPOXSCRSER Historical Background Evaluation: [place] PDMP: Six (6) year initial data search conducted.            NAR X scores narcotics 290, sedatives 120, stimulant 0, overdose risk score 180 [place] Department of public safety, offender search: (Editor, commissioningInformation) Non-contributory Risk Assessment Profile: Aberrant behavior: None observed or detected today Risk factors for fatal opioid overdose: None identified today Fatal overdose hazard ratio (HR): Calculation deferred Non-fatal overdose hazard ratio (HR): Calculation deferred Risk of opioid abuse or dependence: 0.7-3.0% with doses ? 36 MME/day and 6.1-26% with doses ? 120 MME/day. Substance use disorder (SUD) risk level: Pending results of Medical Psychology Evaluation for SUD Opioid risk tool (ORT) (Total Score): 3  ORT Scoring interpretation table:  Score <3 = Low Risk for SUD  Score between 4-7 = Moderate Risk for SUD  Score >8 = High Risk for Opioid Abuse   PHQ-2 Depression Scale:  Total score: 0  PHQ-2 Scoring interpretation table: (Score and probability of major  depressive disorder)  Score 0 = No depression  Score 1 = 15.4% Probability  Score 2 = 21.1% Probability  Score 3 = 38.4% Probability  Score 4 = 45.5% Probability  Score 5 = 56.4% Probability  Score 6 = 78.6% Probability   PHQ-9 Depression Scale:  Total score: 0  PHQ-9 Scoring interpretation table:  Score 0-4 = No depression  Score 5-9 = Mild depression  Score 10-14 = Moderate depression  Score 15-19 = Moderately severe depression  Score 20-27 = Severe depression (2.4 times higher risk of SUD and 2.89 times higher risk of overuse)   Pharmacologic Plan: Pending ordered tests and/or consults  Meds  The patient has a current medication list which includes the following prescription(s): baclofen, duloxetine, fluticasone, gabapentin, insulin aspart, insulin glargine, lidocaine, lisinopril-hydrochlorothiazide, metformin, metoprolol succinate, omeprazole, pantoprazole, potassium chloride sa, tizanidine, and tramadol.  Current Outpatient Medications on File Prior to Visit  Medication Sig  . baclofen (LIORESAL) 10 MG tablet Take 10 mg by mouth 3 (three) times daily.  . DULoxetine (CYMBALTA) 30 MG capsule Take 30 mg by mouth daily.  . fluticasone (FLONASE) 50 MCG/ACT nasal spray Place into both nostrils 2 (two) times daily.  .Marland Kitchengabapentin (NEURONTIN) 300 MG capsule Take 300 mg by mouth 3 (three) times daily.   . insulin aspart (NOVOLOG PENFILL) cartridge Inject 15 Units into the  skin 3 (three) times daily before meals.   . Insulin Glargine (LANTUS SOLOSTAR) 100 UNIT/ML Solostar Pen Inject 80 Units into the skin daily. At bedtime  . lidocaine (LMX) 4 % cream Apply 1 application topically as needed.  Marland Kitchen lisinopril-hydrochlorothiazide (PRINZIDE,ZESTORETIC) 20-12.5 MG tablet Take 1 tablet by mouth daily.  . metFORMIN (GLUCOPHAGE) 500 MG tablet Take 2 tablets by mouth 2 (two) times daily.  . metoprolol succinate (TOPROL-XL) 25 MG 24 hr tablet Take 25 mg by mouth daily.  Marland Kitchen omeprazole (PRILOSEC) 20 MG  capsule Take 1 capsule by mouth daily. As needed for acid reflux  . pantoprazole (PROTONIX) 40 MG tablet Take 40 mg by mouth 2 (two) times daily.  . potassium chloride SA (K-DUR,KLOR-CON) 20 MEQ tablet Take 1 tablet by mouth daily. As needed  . tiZANidine (ZANAFLEX) 2 MG tablet Take by mouth every 6 (six) hours as needed for muscle spasms.  . traMADol (ULTRAM) 50 MG tablet Take by mouth every 6 (six) hours as needed.   No current facility-administered medications on file prior to visit.    Imaging Review  Lumbosacral Imaging: Lumbar MR wo contrast:  Results for orders placed during the hospital encounter of 04/14/17  MR LUMBAR SPINE WO CONTRAST   Narrative CLINICAL DATA:  Low back pain and bilateral leg pain over the last several months.  EXAM: MRI LUMBAR SPINE WITHOUT CONTRAST  TECHNIQUE: Multiplanar, multisequence MR imaging of the lumbar spine was performed. No intravenous contrast was administered.  COMPARISON:  CT 06/22/2016  FINDINGS: Segmentation:  5 lumbar type vertebral bodies.  Alignment:  Curvature convex to the right with the apex at L3.  Vertebrae:  No fracture or primary bone lesion.  Conus medullaris and cauda equina: Conus extends to the L1 level. Conus and cauda equina appear normal.  Paraspinal and other soft tissues: Negative  Disc levels:  No significant finding at L3-4 or above. Minimal non-compressive disc bulges. No stenosis of the canal or foramina.  L4-5: Disc degeneration with bulging of the disc. Facet degeneration and hypertrophy. No compressive stenosis. Some edema of the facet joints could be associated with low back pain or referred facet syndrome pain.  L5-S1: Disc degeneration and bulging of the disc, slightly more towards the right. Mild foraminal narrowing on the right but without definite neural compression. Central canal widely patent. Mild facet arthritis.  IMPRESSION: Lower lumbar degenerative disc disease and degenerative  facet disease which could be a cause of back pain or referred facet syndrome pain. Facet arthropathy is most pronounced at L4-5. No compressive stenosis is demonstrated.   Electronically Signed   By: Nelson Chimes M.D.   On: 04/15/2017 08:55   Note: Available results from prior imaging studies were reviewed.        ROS  Cardiovascular History: Abnormal heart rhythm, High blood pressure and Heart murmur Pulmonary or Respiratory History: No reported pulmonary signs or symptoms such as wheezing and difficulty taking a deep full breath (Asthma), difficulty blowing air out (Emphysema), coughing up mucus (Bronchitis), persistent dry cough, or temporary stoppage of breathing during sleep Neurological History: No reported neurological signs or symptoms such as seizures, abnormal skin sensations, urinary and/or fecal incontinence, being born with an abnormal open spine and/or a tethered spinal cord Review of Past Neurological Studies: No results found for this or any previous visit. Psychological-Psychiatric History: No reported psychological or psychiatric signs or symptoms such as difficulty sleeping, anxiety, depression, delusions or hallucinations (schizophrenial), mood swings (bipolar disorders) or suicidal ideations or attempts  Gastrointestinal History: Reflux or heatburn Genitourinary History: No reported renal or genitourinary signs or symptoms such as difficulty voiding or producing urine, peeing blood, non-functioning kidney, kidney stones, difficulty emptying the bladder, difficulty controlling the flow of urine, or chronic kidney disease Hematological History: No reported hematological signs or symptoms such as prolonged bleeding, low or poor functioning platelets, bruising or bleeding easily, hereditary bleeding problems, low energy levels due to low hemoglobin or being anemic Endocrine History: diabetes requiring insulin Rheumatologic History: No reported rheumatological signs and symptoms  such as fatigue, joint pain, tenderness, swelling, redness, heat, stiffness, decreased range of motion, with or without associated rash Musculoskeletal History: Negative for myasthenia gravis, muscular dystrophy, multiple sclerosis or malignant hyperthermia Work History: Out of work due to pain  Allergies  Ms. Haskins has No Known Allergies.  Laboratory Chemistry  Inflammation Markers No results found for: CRP, ESRSEDRATE (CRP: Acute Phase) (ESR: Chronic Phase) Renal Function Markers Lab Results  Component Value Date   BUN 8 08/22/2016   CREATININE 0.40 (L) 12/04/2016   GFRAA >60 08/22/2016   GFRNONAA >60 08/22/2016   Hepatic Function Markers No results found for: AST, ALT, ALBUMIN, ALKPHOS, HCVAB Electrolytes Lab Results  Component Value Date   NA 134 (L) 08/22/2016   K 3.4 (L) 08/22/2016   CL 96 (L) 08/22/2016   CALCIUM 9.7 08/22/2016   Neuropathy Markers No results found for: [id number] Bone Pathology Markers Lab Results  Component Value Date   CALCIUM 9.7 08/22/2016   Coagulation Parameters Lab Results  Component Value Date   PLT 300 08/22/2016   Cardiovascular Markers Lab Results  Component Value Date   HGB 13.7 08/22/2016   HCT 41.3 08/22/2016   Note: Lab results reviewed.  [place]  Drug: Ms. Trabert  reports that she does not use drugs. Alcohol:  reports that she drinks alcohol. Tobacco:  reports that she has never smoked. She has never used smokeless tobacco. Medical:  has a past medical history of Diabetes mellitus without complication ([place]), GERD (gastroesophageal reflux disease), Hypertension, Neuropathy, Polyneuropathy, and Polyneuropathy. Family: family history includes Alcohol abuse in her father; Breast cancer in her maternal aunt; Cancer in her father.  Past Surgical History:  Procedure Laterality Date  . ABDOMINAL HYSTERECTOMY    . arthroscopic rotator cuff    . COLONOSCOPY    . endosopic sinus     Active Ambulatory Problems    Diagnosis  Date Noted  . Back pain with left-sided sciatica 10/16/2015  . Burning sensation of feet 07/06/2017  . DM type 2, uncontrolled, with neuropathy ([place]) 05/05/2017  . GERD without esophagitis 01/16/2014  . Hypertension 10/12/2017  . Neuropathic pain 06/29/2015  . Neuropathy 08/11/2017  . Numbness and tingling of left leg 07/06/2017  . Pain of right leg 06/24/2017  . Polyneuropathy associated with underlying disease ([place]) 05/29/2017  . Chronic pain of left ankle (Primary Area of Pain) 10/12/2017  . Chronic foot pain, left (Secondary Area of Pain) 10/12/2017  . Chronic pain of left lower extremity [facility] Area of Pain) (L>R) 10/12/2017  . Chronic pain of both knees (Fourth Area of Pain) (L>R) 10/12/2017  . Chronic bilateral low back pain with left-sided sciatica 10/12/2017  . Chronic pain syndrome 10/12/2017  . Opiate use 10/12/2017  . Pharmacologic therapy 10/12/2017  . Disorder of skeletal system 10/12/2017  . Problems influencing health status 10/12/2017   Resolved Ambulatory Problems    Diagnosis Date Noted  . No Resolved Ambulatory Problems   Past Medical History:  Diagnosis Date  .  Diabetes mellitus without complication ([place])   . GERD (gastroesophageal reflux disease)   . Polyneuropathy   . Polyneuropathy    Constitutional Exam  General appearance: Well nourished, well developed, and well hydrated. In no apparent acute distress Vitals:   10/12/17 1037  BP: (!) 186/93  Pulse: 80  Temp: 98.3 F (36.8 C)  SpO2: 100%  Weight: 115 lb (52.2 kg)  Height: 4' 11" (1.499 m)   BMI Assessment: Estimated body mass index is 23.23 kg/m as calculated from the following:   Height as of this encounter: 4' 11" (1.499 m).   Weight as of this encounter: 115 lb (52.2 kg).  BMI interpretation table: BMI level Category Range association with higher incidence of chronic pain  <18 kg/m2 Underweight   18.5-24.9 kg/m2 Ideal body weight   25-29.9 kg/m2 Overweight Increased incidence by  20%  30-34.9 kg/m2 Obese (Class I) Increased incidence by 68%  35-39.9 kg/m2 Severe obesity (Class II) Increased incidence by 136%  >40 kg/m2 Extreme obesity (Class III) Increased incidence by 254%   BMI Readings from Last 4 Encounters:  10/12/17 23.23 kg/m  08/22/16 25.25 kg/m  04/10/16 25.25 kg/m  04/24/15 26.92 kg/m   Wt Readings from Last 4 Encounters:  10/12/17 115 lb (52.2 kg)  08/22/16 125 lb (56.7 kg)  04/10/16 125 lb (56.7 kg)  04/24/15 133 lb 4.8 oz (60.5 kg)  Psych/Mental status: Alert, oriented x 3 (person, place, & time)       Eyes: PERLA Respiratory: No evidence of acute respiratory distress  Cervical Spine Exam  Inspection: No masses, redness, or swelling Alignment: Symmetrical Functional ROM: Unrestricted ROM      Stability: No instability detected Muscle strength & Tone: Functionally intact Sensory: Unimpaired Palpation: No palpable anomalies              Upper Extremity (UE) Exam    Side: Right upper extremity  Side: Left upper extremity  Inspection: No masses, redness, swelling, or asymmetry. No contractures  Inspection: No masses, redness, swelling, or asymmetry. No contractures  Functional ROM: Unrestricted ROM          Functional ROM: Unrestricted ROM          Muscle strength & Tone: Functionally intact  Muscle strength & Tone: Functionally intact  Sensory: Unimpaired  Sensory: Unimpaired  Palpation: No palpable anomalies              Palpation: No palpable anomalies              Specialized Test(s): Deferred         Specialized Test(s): Deferred          Thoracic Spine Exam  Inspection: No masses, redness, or swelling Alignment: Symmetrical Functional ROM: Unrestricted ROM Stability: No instability detected Sensory: Unimpaired Muscle strength & Tone: No palpable anomalies  Lumbar Spine Exam  Inspection: No masses, redness, or swelling Alignment: Symmetrical Functional ROM: Adequate ROM      Stability: No instability detected Muscle  strength & Tone: Functionally intact Sensory: Unimpaired Palpation: Non-tender       Provocative Tests: Lumbar Hyperextension and rotation test: Negative       Patrick's Maneuver: Negative                    Gait & Posture Assessment  Ambulation: Patient ambulates using a cane Gait: Antalgic gait (limping) Posture: WNL   Lower Extremity Exam    Side: Right lower extremity  Side: Left lower extremity  Inspection: No masses,  redness, swelling, or asymmetry. No contractures  Inspection: Edema  Functional ROM: Adequate ROM for all joints of the lower extremity  Functional ROM: Adequate ROM for all joints of the lower extremity  Muscle strength & Tone: Guarding  Muscle strength & Tone: Guarding  Sensory: Unimpaired  Sensory: Unimpaired  Palpation: No palpable anomalies  Palpation: No palpable anomalies   Assessment  Primary Diagnosis & Pertinent Problem List: The primary encounter diagnosis was Chronic pain of left ankle (Primary Area of Pain). Diagnoses of Chronic foot pain, left (Secondary Area of Pain), Chronic pain of left lower extremity (Tertiary Area of Pain) (L>R), Chronic pain of both knees (Fourth Area of Pain) (L>R), Chronic bilateral low back pain with left-sided sciatica, Chronic pain syndrome, Opiate use, Pharmacologic therapy, Disorder of skeletal system, and Problems influencing health status were also pertinent to this visit.  Visit Diagnosis: 1. Chronic pain of left ankle (Primary Area of Pain)   2. Chronic foot pain, left (Secondary Area of Pain)   3. Chronic pain of left lower extremity (Tertiary Area of Pain) (L>R)   4. Chronic pain of both knees (Fourth Area of Pain) (L>R)   5. Chronic bilateral low back pain with left-sided sciatica   6. Chronic pain syndrome   7. Opiate use   8. Pharmacologic therapy   9. Disorder of skeletal system   10. Problems influencing health status    Plan of Care  Initial treatment plan:  Please be advised that as per protocol,  today's visit has been an evaluation only. We have not taken over the patient's controlled substance management.  Problem-specific plan: No problem-specific Assessment & Plan notes found for this encounter.  Ordered Lab-work, Procedure(s), Referral(s), & Consult(s): Orders Placed This Encounter  Procedures  . Compliance Drug Analysis, Ur  . Comp. Metabolic Panel (12)  . Magnesium  . Vitamin B12  . Sedimentation rate  . 25-Hydroxyvitamin D Lcms D2+D3  . C-reactive protein   Pharmacotherapy: Medications ordered:  No orders of the defined types were placed in this encounter.  Medications administered during this visit: Marinna L. Pevey had no medications administered during this visit.   Pharmacotherapy under consideration:  Opioid Analgesics: The patient was informed that there is no guarantee that she would be a candidate for opioid analgesics. The decision will be made following CDC guidelines. This decision will be based on the results of diagnostic studies, as well as Ms. Ventresca's risk profile.  Membrane stabilizer: To be determined at a later time Muscle relaxant: To be determined at a later time NSAID: To be determined at a later time Other analgesic(s): To be determined at a later time   Interventional therapies under consideration: Ms. Dekay was informed that there is no guarantee that she would be a candidate for interventional therapies. The decision will be based on the results of diagnostic studies, as well as Ms. Winkles's risk profile.  Possible procedure(s): Diagnostic left-sided lumbar epidural steroid injection Diagnostic bilateral lumbar facet nerve block Possible bilateral lumbar facet radiofrequency ablation Diagnostic left-sided sympathetic nerve block   Provider-requested follow-up: Return for 2nd Visit, w/ Dr. Dossie Arbour.  Future Appointments  Date Time Provider [place]  10/21/2017  8:45 AM Milinda Pointer, MD [facility] None    Primary Care  Physician: Tracie Harrier, MD Location: [facility] Note by:  Date: 10/12/2017; Time: 1:01 PM  Pain Score Disclaimer: We use the NRS-11 scale. This is a self-reported, subjective measurement of pain severity with only modest accuracy. It is  used primarily to identify changes within a particular patient. It must be understood that outpatient pain scales are significantly less accurate that those used for research, where they can be applied under ideal controlled circumstances with minimal exposure to variables. In reality, the score is likely to be a combination of pain intensity and pain affect, where pain affect describes the degree of emotional arousal or changes in action readiness caused by the sensory experience of pain. Factors such as social and work situation, setting, emotional state, anxiety levels, expectation, and prior pain experience may influence pain perception and show large inter-individual differences that may also be affected by time variables.  Patient instructions provided during this appointment: Patient Instructions   ____________________________________________________________________________________________  Appointment Policy Summary  It is our goal and responsibility to provide the medical community with assistance in the evaluation and management of patients with chronic pain. Unfortunately our resources are limited. Because we do not have an unlimited amount of time, or available appointments, we are required to closely monitor and manage their use. The following rules exist to maximize their use:  Patient's responsibilities: 1. Punctuality:  At what time should I arrive? You should be physically present in our office 30 minutes before your scheduled appointment. Your scheduled appointment is with your assigned healthcare provider. However, it takes 5-10 minutes to be "checked-in", and another 15 minutes for the nurses to do the  admission. If you arrive to our office at the time you were given for your appointment, you will end up being at least 20-25 minutes late to your appointment with the provider. 2. Tardiness:  What happens if I arrive only a few minutes after my scheduled appointment time? You will need to reschedule your appointment. The cutoff is your appointment time. This is why it is so important that you arrive at least 30 minutes before that appointment. If you have an appointment scheduled for 10:00 AM and you arrive at 10:01, you will be required to reschedule your appointment.  3. Plan ahead:  Always assume that you will encounter traffic on your way in. Plan for it. If you are dependent on a driver, make sure they understand these rules and the need to arrive early. 4. Other appointments and responsibilities:  Avoid scheduling any other appointments before or after your pain clinic appointments.  5. Be prepared:  Write down everything that you need to discuss with your healthcare provider and give this information to the admitting nurse. Write down the medications that you will need refilled. Bring your pills and bottles (even the empty ones), to all of your appointments, except for those where a procedure is scheduled. 6. No children or pets:  Find someone to take care of them. It is not appropriate to bring them in. 7. Scheduling changes:  We request "advanced notification" of any changes or cancellations. 8. Advanced notification:  Defined as a time period of more than 24 hours prior to the originally scheduled appointment. This allows for the appointment to be offered to other patients. 9. Rescheduling:  When a visit is rescheduled, it will require the cancellation of the original appointment. For this reason they both fall within the category of "Cancellations".  10. Cancellations:  They require advanced notification. Any cancellation less than 24 hours before the  appointment will be recorded as a  "No Show". 11. No Show:  Defined as an unkept appointment where the patient failed to notify or declare to the practice their intention or inability to keep the  appointment.  Corrective process for repeat offenders:  1. Tardiness: Three (3) episodes of rescheduling due to late arrivals will be recorded as one (1) "No Show". 2. Cancellation or reschedule: Three (3) cancellations or rescheduling will be recorded as one (1) "No Show". 3. "No Shows": Three (3) "No Shows" within a 12 month period will result in discharge from the practice. ____________________________________________________________________________________________  ____________________________________________________________________________________________  Pain Scale  Introduction: The pain score used by this practice is the Verbal Numerical Rating Scale (VNRS-11). This is an 11-point scale. It is for adults and children 10 years or older. There are significant differences in how the pain score is reported, used, and applied. Forget everything you learned in the past and learn this scoring system.  General Information: The scale should reflect your current level of pain. Unless you are specifically asked for the level of your worst pain, or your average pain. If you are asked for one of these two, then it should be understood that it is over the past 24 hours.  Basic Activities of Daily Living (ADL): Personal hygiene, dressing, eating, transferring, and using restroom.  Instructions: Most patients tend to report their level of pain as a combination of two factors, their physical pain and their psychosocial pain. This last one is also known as "suffering" and it is reflection of how physical pain affects you socially and psychologically. From now on, report them separately. From this point on, when asked to report your pain level, report only your physical pain. Use the following table for reference.  Pain Clinic Pain Levels  (0-5/10)  Pain Level Score  Description  No Pain 0   Mild pain 1 Nagging, annoying, but does not interfere with basic activities of daily living (ADL). Patients are able to eat, bathe, get dressed, toileting (being able to get on and off the toilet and perform personal hygiene functions), transfer (move in and out of bed or a chair without assistance), and maintain continence (able to control bladder and bowel functions). Blood pressure and heart rate are unaffected. A normal heart rate for a healthy adult ranges from 60 to 100 bpm (beats per minute).   Mild to moderate pain 2 Noticeable and distracting. Impossible to hide from other people. More frequent flare-ups. Still possible to adapt and function close to normal. It can be very annoying and may have occasional stronger flare-ups. With discipline, patients may get used to it and adapt.   Moderate pain 3 Interferes significantly with activities of daily living (ADL). It becomes difficult to feed, bathe, get dressed, get on and off the toilet or to perform personal hygiene functions. Difficult to get in and out of bed or a chair without assistance. Very distracting. With effort, it can be ignored when deeply involved in activities.   Moderately severe pain 4 Impossible to ignore for more than a few minutes. With effort, patients may still be able to manage work or participate in some social activities. Very difficult to concentrate. Signs of autonomic nervous system discharge are evident: dilated pupils (mydriasis); mild sweating (diaphoresis); sleep interference. Heart rate becomes elevated (>115 bpm). Diastolic blood pressure (lower number) rises above 100 mmHg. Patients find relief in laying down and not moving.   Severe pain 5 Intense and extremely unpleasant. Associated with frowning face and frequent crying. Pain overwhelms the senses.  Ability to do any activity or maintain social relationships becomes significantly limited. Conversation  becomes difficult. Pacing back and forth is common, as getting into a comfortable position  is nearly impossible. Pain wakes you up from deep sleep. Physical signs will be obvious: pupillary dilation; increased sweating; goosebumps; brisk reflexes; cold, clammy hands and feet; nausea, vomiting or dry heaves; loss of appetite; significant sleep disturbance with inability to fall asleep or to remain asleep. When persistent, significant weight loss is observed due to the complete loss of appetite and sleep deprivation.  Blood pressure and heart rate becomes significantly elevated. Caution: If elevated blood pressure triggers a pounding headache associated with blurred vision, then the patient should immediately seek attention at an urgent or emergency care unit, as these may be signs of an impending stroke.    Emergency Department Pain Levels (6-10/10)  Emergency Room Pain 6 Severely limiting. Requires emergency care and should not be seen or managed at an outpatient pain management facility. Communication becomes difficult and requires great effort. Assistance to reach the emergency department may be required. Facial flushing and profuse sweating along with potentially dangerous increases in heart rate and blood pressure will be evident.   Distressing pain 7 Self-care is very difficult. Assistance is required to transport, or use restroom. Assistance to reach the emergency department will be required. Tasks requiring coordination, such as bathing and getting dressed become very difficult.   Disabling pain 8 Self-care is no longer possible. At this level, pain is disabling. The individual is unable to do even the most "basic" activities such as walking, eating, bathing, dressing, transferring to a bed, or toileting. Fine motor skills are lost. It is difficult to think clearly.   Incapacitating pain 9 Pain becomes incapacitating. Thought processing is no longer possible. Difficult to remember your own name.  Control of movement and coordination are lost.   The worst pain imaginable 10 At this level, most patients pass out from pain. When this level is reached, collapse of the autonomic nervous system occurs, leading to a sudden drop in blood pressure and heart rate. This in turn results in a temporary and dramatic drop in blood flow to the brain, leading to a loss of consciousness. Fainting is one of the body's self defense mechanisms. Passing out puts the brain in a calmed state and causes it to shut down for a while, in order to begin the healing process.    Summary: 1. Refer to this scale when providing [place] with your pain level. 2. Be accurate and careful when reporting your pain level. This will help with your care. 3. Over-reporting your pain level will lead to loss of credibility. 4. Even a level of 1/10 means that there is pain and will be treated at our facility. 5. High, inaccurate reporting will be documented as "Symptom Exaggeration", leading to loss of credibility and suspicions of possible secondary gains such as obtaining more narcotics, or wanting to appear disabled, for fraudulent reasons. 6. Only pain levels of 5 or below will be seen at our facility. 7. Pain levels of 6 and above will be sent to the Emergency Department and the appointment cancelled. ____________________________________________________________________________________________

## 2017-10-15 LAB — COMPLIANCE DRUG ANALYSIS, UR

## 2017-10-16 LAB — COMP. METABOLIC PANEL (12)
AST: 16 IU/L (ref 0–40)
Albumin/Globulin Ratio: 1.6 (ref 1.2–2.2)
Albumin: 4.2 g/dL (ref 3.5–5.5)
Alkaline Phosphatase: 97 IU/L (ref 39–117)
BUN / CREAT RATIO: 17 (ref 9–23)
BUN: 10 mg/dL (ref 6–24)
Bilirubin Total: <0.2 mg/dL (ref 0.0–1.2)
CALCIUM: 9.8 mg/dL (ref 8.7–10.2)
CREATININE: 0.6 mg/dL (ref 0.57–1.00)
Chloride: 94 mmol/L — ABNORMAL LOW (ref 96–106)
GFR calc Af Amer: 119 mL/min/{1.73_m2} (ref >59)
GFR, EST NON AFRICAN AMERICAN: 103 mL/min/{1.73_m2} (ref >59)
GLUCOSE: 294 mg/dL — AB (ref 65–99)
Globulin, Total: 2.6 g/dL (ref 1.5–4.5)
Potassium: 4.2 mmol/L (ref 3.5–5.2)
Sodium: 138 mmol/L (ref 134–144)
TOTAL PROTEIN: 6.8 g/dL (ref 6.0–8.5)

## 2017-10-16 LAB — 25-HYDROXYVITAMIN D LCMS D2+D3: 25-HYDROXY, VITAMIN D: 17 ng/mL — AB

## 2017-10-16 LAB — 25-HYDROXY VITAMIN D LCMS D2+D3: 25-Hydroxy, Vitamin D-3: 17 ng/mL

## 2017-10-16 LAB — C-REACTIVE PROTEIN: CRP: 4 mg/L (ref 0–10)

## 2017-10-16 LAB — VITAMIN B12: VITAMIN B 12: 1063 pg/mL (ref 232–1245)

## 2017-10-16 LAB — SEDIMENTATION RATE: Sed Rate: 12 mm/hr (ref 0–40)

## 2017-10-16 LAB — MAGNESIUM: MAGNESIUM: 2 mg/dL (ref 1.6–2.3)

## 2017-10-20 NOTE — Progress Notes (Signed)
Patient's Name: Charlene [name]  MRN: [id number]  Referring Provider: Tracie Harrier, MD  DOB: 11-14-1960  PCP: Tracie Harrier, MD  DOS: 10/21/2017  Note by: Gaspar Cola, MD  Service setting: Ambulatory outpatient  Specialty: Interventional Pain Management  Location: [facility] (AMB) Pain Management Facility    Patient type: Established   Primary Reason(s) for Visit: Encounter for evaluation before starting new chronic pain management plan of care (Level of risk: moderate) CC: Leg Pain (left)  HPI  Charlene [name] is a 57 y.o. year old, female patient, who comes today for a follow-up evaluation to review the test results and decide on a treatment plan. She has Burning sensation of feet; DM type 2, uncontrolled, with neuropathy ([place]); GERD without esophagitis; Hypertension; Neuropathic pain; Neuropathy; Lower extremity numbness and tingling (Left); Polyneuropathy associated with underlying disease ([place]); Chronic ankle pain (Primary Area of Pain) (Left); Chronic foot pain (Primary Area of Pain) (Left); Chronic lower extremity pain (Secondary Area of Pain) (Bilateral) (L>R); Chronic knee pain (Fourth Area of Pain) (Bilateral) (L>R); Chronic low back pain [facility] Area of Pain) (Bilateral) (L>R) w/ sciatica (Left); Chronic pain syndrome; Opiate use; Pharmacologic therapy; Disorder of skeletal system; Problems influencing health status; Vitamin D deficiency; Diabetic peripheral neuropathy ([place]); DDD (degenerative disc disease), lumbar; Lumbar facet arthropathy; Lumbar facet syndrome (Bilateral); Lumbar facet hypertrophy; Lumbar foraminal stenosis (L5-S1) (Right); Osteoarthritis of knee (Bilateral); and Chronic musculoskeletal pain on their problem list. Her primarily concern today is the Leg Pain (left)  Pain Assessment: Location: Left Leg Radiating: pain radiaites down left leg, front and back of calf, down to ankle and foot to toes Onset: More than a month ago Duration: Chronic pain Quality: Aching,  Burning, Tingling, Constant Severity: 8 /10 (subjective, self-reported pain score)  Note: Reported level is inconsistent with clinical observations. Clinically the patient looks like a 3/10 A 3/10 is viewed as "Moderate" and described as significantly interfering with activities of daily living (ADL). It becomes difficult to feed, bathe, get dressed, get on and off the toilet or to perform personal hygiene functions. Difficult to get in and out of bed or a chair without assistance. Very distracting. With effort, it can be ignored when deeply involved in activities. Information on the proper use of the pain scale provided to the patient today. When using our objective Pain Scale, levels between 6 and 10/10 are said to belong in an emergency room, as it progressively worsens from a 6/10, described as severely limiting, requiring emergency care not usually available at an outpatient pain management facility. At a 6/10 level, communication becomes difficult and requires great effort. Assistance to reach the emergency department may be required. Facial flushing and profuse sweating along with potentially dangerous increases in heart rate and blood pressure will be evident. Effect on ADL: limits my daily activities Timing: Constant Modifying factors: medications, rest BP: (!) 160/95  HR: 84  Charlene [name] comes in today for a follow-up visit after her initial evaluation on 10/12/2017. Today we went over the results of her tests. These were explained in "Layman's terms". During today's appointment we went over my diagnostic impression, as well as the proposed treatment plan.  According to the patient her primary area pain is in her left ankle and foot (Left only) pain is in inner, outer, and anterior areas of ankle.  She denies any previous injury.  Gradual onset. She admits that she does have peripheral neuropathy secondary to uncontrolled diabetes.  She admits that she has constant swelling in her  left ankle with  pain.  She has burning to the bottom of her feet.  She has tingling also in the foot.  She admits that she has throbbing in the toes.  She states that sometimes it affects all the toes but the great toe is the worse.  She admits that this has been going on since of March when she stopped working.  She admits that she did see Dr. Vickki Muff (Podiatrist, [facility]) (treated ankle) on last month and he gave her an injection to her ankle, anterolateral to the ankle, which she states was not effective.  She indicates that it never went numb. She has had recent images of her foot and ankle.    Her second third area of pain is in her legs (B) (L>R).  She admits the left is greater than the right.  LLEP - down to toes, turning over to top and into big toe (L5). Has had this pain x >4 months. RLEP - down to the knee, running thru front of thigh. She has weakness and cramps in her left thigh.  She states that that is the front in the back.  She has had a previous nerve conduction study (Dr. Lannie Fields office, [facility]).  Her third area of pain is in her lower back (B) (Midline) (L>R).  She admits that this is an occasional pain.  Denies any previous surgery.  She did have previous injections with Dr. Sharlet Salina she does not feel like they were effective. (Left L5-S1 transforaminal epidural injection x2 under fluoroscopic guidance on 05/13/2017 and 06/16/2017.  Right-sided L4-5 transforaminal epidural steroid injection on 05/13/2017). Dr. Sabra Heck requested only one side on second injection. When she had it on the left only, it helped more. Numbness in the back lasted about a day. Leg never got numb. While back was numb, she was having no pain in the back. That lasted x 1 day. Going to physical therapy @ [facility] in [place].  She has had recent x-rays.   Note: Upon reviewing the notes from the transforaminal epidural steroid injections done by Dr. Sharlet Salina, the dictations revealed that he injected a total volume of 1.0 mL consisting of  0.5 cc of dexamethasone 10 mg/mL (5 mg) +0.5 cc of 2% lidocaine (1% final concentration), per nerve root.   Last area of pain is in her knees (B) (L>R).  She admits that the left is greater than the right.  She has pain she denies any swelling.  She denies any previous surgery, interventional therapy.  She does admit that she is seeing Dr. Rudene Christians.  She is unsure she has had x-rays. No injections, up to this point.  In considering the treatment plan options, Ms. Greenleaf was reminded that I no longer take patients for medication management only. I asked her to let me know if she had no intention of taking advantage of the interventional therapies, so that we could make arrangements to provide this space to someone interested. I also made it clear that undergoing interventional therapies for the purpose of getting pain medications is very inappropriate on the part of a patient, and it will not be tolerated in this practice. This type of behavior would suggest true addiction and therefore it requires referral to an addiction specialist.   Further details on both, my assessment(s), as well as the proposed treatment plan, please see below.  Controlled Substance Pharmacotherapy Assessment REMS (Risk Evaluation and Mitigation Strategy)  Analgesic: Tramadol 50 mg 1 tablet 3 times daily (last fill  date 09/01/2017) tramadol 150 mg/day + Gabapentin 800 mg PO QID + Cymbalta 30 mg PO QD. Highest recorded MME/day: 34m/day MME/day: 15 mg/day Pill Count: None expected due to no prior prescriptions written by our practice. No notes on file Pharmacokinetics: Liberation and absorption (onset of action): WNL Distribution (time to peak effect): WNL Metabolism and excretion (duration of action): WNL         Pharmacodynamics: Desired effects: Analgesia: Ms. OAstaritareports >50% benefit. Functional ability: Patient reports that medication allows her to accomplish basic ADLs Clinically meaningful improvement in function  (CMIF): Sustained CMIF goals met Perceived effectiveness: Described as relatively effective, allowing for increase in activities of daily living (ADL) Undesirable effects: Side-effects or Adverse reactions: None reported Monitoring: [place] PMP: Online review of the past 153-montheriod previously conducted. Not applicable at this point since we have not taken over the patient's medication management yet. List of other Serum/Urine Drug Screening Test(s):  No results found. List of all UDS test(s) done:  Lab Results  Component Value Date   SUMMARY FINAL 10/12/2017   Last UDS on record: Summary  Date Value Ref Range Status  10/12/2017 FINAL  Final    Comment:    ==================================================================== TOXASSURE COMP DRUG ANALYSIS,UR ==================================================================== Test                             Result       Flag       Units Drug Present and Declared for Prescription Verification   Tramadol                       >1>16109     EXPECTED   ng/mg creat   O-Desmethyltramadol            8266         EXPECTED   ng/mg creat   N-Desmethyltramadol            2066         EXPECTED   ng/mg creat    Source of tramadol is a prescription medication.    O-desmethyltramadol and N-desmethyltramadol are expected    metabolites of tramadol.   Gabapentin                     PRESENT      EXPECTED   Baclofen                       PRESENT      EXPECTED   Metoprolol                     PRESENT      EXPECTED Drug Present not Declared for Prescription Verification   Alcohol, Ethyl                 0.154        UNEXPECTED g/dL    Sources of ethyl alcohol include alcoholic beverages or as a    fermentation product of glucose; glucose is present in this    specimen.  Interpret result with caution, as the presence of    ethyl alcohol is likely due, at least in part, to fermentation of    glucose.   Naproxen                       PRESENT       UNEXPECTED  Drug Absent but Declared for Prescription Verification   Tizanidine                     Not Detected UNEXPECTED    Tizanidine, as indicated in the declared medication list, is not    always detected even when used as directed.   Duloxetine                     Not Detected UNEXPECTED   Lidocaine                      Not Detected UNEXPECTED    Lidocaine, as indicated in the declared medication list, is not    always detected even when used as directed. ==================================================================== Test                      Result    Flag   Units      Ref Range   Creatinine              47               mg/dL      >=20 ==================================================================== Declared Medications:  The flagging and interpretation on this report are based on the  following declared medications.  Unexpected results may arise from  inaccuracies in the declared medications.  **Note: The testing scope of this panel includes these medications:  Baclofen  Duloxetine  Gabapentin  Metoprolol  Tramadol  **Note: The testing scope of this panel does not include small to  moderate amounts of these reported medications:  Lidocaine  Tizanidine  **Note: The testing scope of this panel does not include following  reported medications:  Fluticasone  Hydrochlorothiazide (Lisinopril-HCTZ)  Insulin  Lisinopril (Lisinopril-HCTZ)  Metformin  Omeprazole  Pantoprazole  Potassium ==================================================================== For clinical consultation, please call [phone]. ====================================================================    UDS interpretation: No unexpected findings.          Medication Assessment Form: Patient introduced to form today Treatment compliance: Treatment may start today if patient agrees with proposed plan. Evaluation of compliance is not applicable at this point Risk Assessment  Profile: Aberrant behavior: See initial evaluations. None observed or detected today Comorbid factors increasing risk of overdose: See initial evaluation. No additional risks detected today Opioid risk tool (ORT) (Total Score): 3 Personal History of Substance Abuse (SUD-Substance use disorder):  Alcohol: Alcohol: Negative  Illegal Drugs: Illegal Drugs: Negative  Rx Drugs: Rx Drugs: Negative  ORT Risk Level calculation: Opioid Risk Interpretation: Low Risk Risk of substance use disorder (SUD): Low Opioid Risk Tool - 10/21/17 0905      Family History of Substance Abuse   Alcohol  Positive Female    Illegal Drugs  Negative    Rx Drugs  Negative      Personal History of Substance Abuse   Alcohol  Negative    Illegal Drugs  Negative    Rx Drugs  Negative      Age   Age between 44-45 years   No      History of Preadolescent Sexual Abuse   History of Preadolescent Sexual Abuse  Negative or Female      Psychological Disease   Psychological Disease  Negative    Depression  Negative      Total Score   Opioid Risk Tool Scoring  3    Opioid Risk Interpretation  Low Risk      ORT Scoring interpretation  table:  Score <3 = Low Risk for SUD  Score between 4-7 = Moderate Risk for SUD  Score >8 = High Risk for Opioid Abuse   Risk Mitigation Strategies:  Patient opioid safety counseling: Completed today. Counseling provided to patient as per "Patient Counseling Document". Document signed by patient, attesting to counseling and understanding Patient-Prescriber Agreement (PPA): Obtained today.  Controlled substance notification to other providers: Written and sent today.  Pharmacologic Plan: Today we may be taking over the patient's pharmacological regimen. See below.             Laboratory Chemistry  Inflammation Markers (CRP: Acute Phase) (ESR: Chronic Phase) Lab Results  Component Value Date   CRP 4 10/12/2017   ESRSEDRATE 12 10/12/2017                         Rheumatology  Markers No results found.  Renal Function Markers Lab Results  Component Value Date   BUN 10 10/12/2017   CREATININE 0.60 10/12/2017   BCR 17 10/12/2017   GFRAA 119 10/12/2017   GFRNONAA 103 10/12/2017                             Hepatic Function Markers Lab Results  Component Value Date   AST 16 10/12/2017   ALBUMIN 4.2 10/12/2017   ALKPHOS 97 10/12/2017                        Electrolytes Lab Results  Component Value Date   NA 138 10/12/2017   K 4.2 10/12/2017   CL 94 (L) 10/12/2017   CALCIUM 9.8 10/12/2017   MG 2.0 10/12/2017                        Neuropathy Markers Lab Results  Component Value Date   VITAMINB12 1,063 10/12/2017                        CNS Tests No results found.  Bone Pathology Markers Lab Results  Component Value Date   25OHVITD1 17 (L) 10/12/2017   25OHVITD2 <1.0 10/12/2017   25OHVITD3 17 10/12/2017                         Coagulation Parameters Lab Results  Component Value Date   PLT 300 08/22/2016                        Cardiovascular Markers Lab Results  Component Value Date   HGB 13.7 08/22/2016   HCT 41.3 08/22/2016                         CA Markers No results found.  Note: Lab results reviewed.  Recent Diagnostic Imaging Review  Lumbosacral Imaging: Lumbar MR wo contrast:  Results for orders placed during the hospital encounter of 04/14/17  MR LUMBAR SPINE WO CONTRAST   Narrative CLINICAL DATA:  Low back pain and bilateral leg pain over the last several months.  EXAM: MRI LUMBAR SPINE WITHOUT CONTRAST  TECHNIQUE: Multiplanar, multisequence MR imaging of the lumbar spine was performed. No intravenous contrast was administered.  COMPARISON:  CT 06/22/2016  FINDINGS: Segmentation:  5 lumbar type vertebral bodies.  Alignment:  Curvature convex to the right with the apex at L3.  Vertebrae:  No fracture or primary bone lesion.  Conus medullaris and cauda equina: Conus extends to the L1 level. Conus and  cauda equina appear normal.  Paraspinal and other soft tissues: Negative  Disc levels:  No significant finding at L3-4 or above. Minimal non-compressive disc bulges. No stenosis of the canal or foramina.  L4-5: Disc degeneration with bulging of the disc. Facet degeneration and hypertrophy. No compressive stenosis. Some edema of the facet joints could be associated with low back pain or referred facet syndrome pain.  L5-S1: Disc degeneration and bulging of the disc, slightly more towards the right. Mild foraminal narrowing on the right but without definite neural compression. Central canal widely patent. Mild facet arthritis.  IMPRESSION: Lower lumbar degenerative disc disease and degenerative facet disease which could be a cause of back pain or referred facet syndrome pain. Facet arthropathy is most pronounced at L4-5. No compressive stenosis is demonstrated.   Electronically Signed   By: Nelson Chimes M.D.   On: 04/15/2017 08:55    Complexity Note: Imaging results reviewed. Results shared with [name], using Layman's terms.                         Meds   Current Outpatient Medications:  .  baclofen (LIORESAL) 10 MG tablet, Take 1 tablet (10 mg total) by mouth 3 (three) times daily., Disp: 90 tablet, Rfl: 0 .  DULoxetine (CYMBALTA) 30 MG capsule, Take 30 mg by mouth daily., Disp: , Rfl:  .  fluticasone (FLONASE) 50 MCG/ACT nasal spray, Place into both nostrils 2 (two) times daily., Disp: , Rfl:  .  gabapentin (NEURONTIN) 800 MG tablet, Take 800 mg by mouth 4 (four) times daily., Disp: , Rfl:  .  insulin aspart (NOVOLOG PENFILL) cartridge, Inject 15 Units into the skin 3 (three) times daily before meals. , Disp: , Rfl:  .  Insulin Glargine (LANTUS SOLOSTAR) 100 UNIT/ML Solostar Pen, Inject 80 Units into the skin daily. At bedtime, Disp: , Rfl:  .  lidocaine (LMX) 4 % cream, Apply 1 application topically as needed., Disp: , Rfl:  .  lisinopril-hydrochlorothiazide  (PRINZIDE,ZESTORETIC) 20-12.5 MG tablet, Take 1 tablet by mouth daily., Disp: , Rfl:  .  metFORMIN (GLUCOPHAGE) 500 MG tablet, Take 2 tablets by mouth 2 (two) times daily., Disp: , Rfl:  .  metoprolol succinate (TOPROL-XL) 25 MG 24 hr tablet, Take 25 mg by mouth daily., Disp: , Rfl:  .  omeprazole (PRILOSEC) 20 MG capsule, Take 1 capsule by mouth daily. As needed for acid reflux, Disp: , Rfl:  .  pantoprazole (PROTONIX) 40 MG tablet, Take 40 mg by mouth 2 (two) times daily., Disp: , Rfl:  .  potassium chloride SA (K-DUR,KLOR-CON) 20 MEQ tablet, Take 1 tablet by mouth daily. As needed, Disp: , Rfl:  .  traMADol (ULTRAM) 50 MG tablet, Take 1 tablet (50 mg total) by mouth every 8 (eight) hours as needed for severe pain., Disp: 90 tablet, Rfl: 0 .  Calcium Carbonate-Vit D-Min (GNP CALCIUM 1200) 1200-1000 MG-UNIT CHEW, Chew 1,200 mg by mouth daily with breakfast. Take in combination with vitamin D and magnesium., Disp: 30 tablet, Rfl: 5 .  Cholecalciferol (VITAMIN D3) 5000 units CAPS, Take 1 capsule (5,000 Units total) by mouth daily with breakfast. Take along with calcium and magnesium., Disp: 30 capsule, Rfl: 5 .  ergocalciferol (VITAMIN D2) 50000 units capsule, Take 1 capsule (50,000 Units total) by mouth 2 (two) times a week. X  6 weeks., Disp: 12 capsule, Rfl: 0 .  Magnesium 500 MG CAPS, Take 1 capsule (500 mg total) by mouth 2 (two) times daily at 8 am and 10 pm., Disp: 60 capsule, Rfl: 5  ROS  Constitutional: Denies any fever or chills Gastrointestinal: No reported hemesis, hematochezia, vomiting, or acute GI distress Musculoskeletal: Denies any acute onset joint swelling, redness, loss of ROM, or weakness Neurological: No reported episodes of acute onset apraxia, aphasia, dysarthria, agnosia, amnesia, paralysis, loss of coordination, or loss of consciousness  Allergies  Ms. Sima has No Known Allergies.  [place]  Drug: Ms. Gsell  reports that she does not use drugs. Alcohol:  reports that she  drinks alcohol. Tobacco:  reports that she has never smoked. She has never used smokeless tobacco. Medical:  has a past medical history of Diabetes mellitus without complication ([place]), GERD (gastroesophageal reflux disease), Hypertension, Neuropathy, Polyneuropathy, and Polyneuropathy. Surgical: Ms. Weyrauch  has a past surgical history that includes arthroscopic rotator cuff; Colonoscopy; Abdominal hysterectomy; and endosopic sinus. Family: family history includes Alcohol abuse in her father; Breast cancer in her maternal aunt; Cancer in her father.  Constitutional Exam  General appearance: Well nourished, well developed, and well hydrated. In no apparent acute distress Vitals:   10/21/17 0857  BP: (!) 160/95  Pulse: 84  Temp: (!) 97.3 F (36.3 C)  SpO2: 100%  Weight: 115 lb (52.2 kg)  Height: '4\' 11"'$  (1.499 m)   BMI Assessment: Estimated body mass index is 23.23 kg/m as calculated from the following:   Height as of this encounter: '4\' 11"'$  (1.499 m).   Weight as of this encounter: 115 lb (52.2 kg).  BMI interpretation table: BMI level Category Range association with higher incidence of chronic pain  <18 kg/m2 Underweight   18.5-24.9 kg/m2 Ideal body weight   25-29.9 kg/m2 Overweight Increased incidence by 20%  30-34.9 kg/m2 Obese (Class I) Increased incidence by 68%  35-39.9 kg/m2 Severe obesity (Class II) Increased incidence by 136%  >40 kg/m2 Extreme obesity (Class III) Increased incidence by 254%   Patient's current BMI Ideal Body weight  Body mass index is 23.23 kg/m. Patient must be at least 60 in tall to calculate ideal body weight   BMI Readings from Last 4 Encounters:  10/21/17 23.23 kg/m  10/12/17 23.23 kg/m  08/22/16 25.25 kg/m  04/10/16 25.25 kg/m   Wt Readings from Last 4 Encounters:  10/21/17 115 lb (52.2 kg)  10/12/17 115 lb (52.2 kg)  08/22/16 125 lb (56.7 kg)  04/10/16 125 lb (56.7 kg)  Psych/Mental status: Alert, oriented x 3 (person, place, & time)        Eyes: PERLA Respiratory: No evidence of acute respiratory distress  Cervical Spine Area Exam  Skin & Axial Inspection: No masses, redness, edema, swelling, or associated skin lesions Alignment: Symmetrical Functional ROM: Unrestricted ROM      Stability: No instability detected Muscle Tone/Strength: Functionally intact. No obvious neuro-muscular anomalies detected. Sensory (Neurological): Unimpaired Palpation: No palpable anomalies              Upper Extremity (UE) Exam    Side: Right upper extremity  Side: Left upper extremity  Skin & Extremity Inspection: Skin color, temperature, and hair growth are WNL. No peripheral edema or cyanosis. No masses, redness, swelling, asymmetry, or associated skin lesions. No contractures.  Skin & Extremity Inspection: Skin color, temperature, and hair growth are WNL. No peripheral edema or cyanosis. No masses, redness, swelling, asymmetry, or associated skin lesions. No contractures.  Functional ROM: Unrestricted ROM          Functional ROM: Unrestricted ROM          Muscle Tone/Strength: Functionally intact. No obvious neuro-muscular anomalies detected.  Muscle Tone/Strength: Functionally intact. No obvious neuro-muscular anomalies detected.  Sensory (Neurological): Unimpaired          Sensory (Neurological): Unimpaired          Palpation: No palpable anomalies              Palpation: No palpable anomalies              Provocative Test(s):  Phalen's test: deferred Tinel's test: deferred Apley's scratch test (touch opposite shoulder):  Action 1 (Across chest): deferred Action 2 (Overhead): deferred Action 3 (LB reach): deferred   Provocative Test(s):  Phalen's test: deferred Tinel's test: deferred Apley's scratch test (touch opposite shoulder):  Action 1 (Across chest): deferred Action 2 (Overhead): deferred Action 3 (LB reach): deferred    Thoracic Spine Area Exam  Skin & Axial Inspection: No masses, redness, or swelling Alignment:  Symmetrical Functional ROM: Unrestricted ROM Stability: No instability detected Muscle Tone/Strength: Functionally intact. No obvious neuro-muscular anomalies detected. Sensory (Neurological): Unimpaired Muscle strength & Tone: No palpable anomalies  Lumbar Spine Area Exam  Skin & Axial Inspection: No masses, redness, or swelling Alignment: Symmetrical Functional ROM: Unrestricted ROM       Stability: No instability detected Muscle Tone/Strength: Functionally intact. No obvious neuro-muscular anomalies detected. Sensory (Neurological): Movement-associated discomfort Palpation: No palpable anomalies       Provocative Tests: Hyperextension/rotation test: Equivocal       Lumbar quadrant test (Kemp's test): (+) on the left for facet joint pain. Lateral bending test: (+) ipsilateral left-sided low back pain  Patrick's Maneuver: (-) for bilateral S-I arthralgia and for bilateral hip arthralgia FABER test: (-) for bilateral S-I arthralgia and for bilateral hip arthralgia S-I anterior distraction/compression test: deferred today         S-I lateral compression test: deferred today         S-I Thigh-thrust test: deferred today         S-I Gaenslen's test: deferred today          Gait & Posture Assessment  Ambulation: Patient ambulates using a cane Gait: Limited. Using assistive device to ambulate Posture: WNL   Lower Extremity Exam    Side: Right lower extremity  Side: Left lower extremity  Stability: No instability observed          Stability: No instability observed          Skin & Extremity Inspection: Skin color, temperature, and hair growth are WNL. No peripheral edema or cyanosis. No masses, redness, swelling, asymmetry, or associated skin lesions. No contractures.  Skin & Extremity Inspection: Skin color, temperature, and hair growth are WNL. No peripheral edema or cyanosis. No masses, redness, swelling, asymmetry, or associated skin lesions. No contractures.  Functional ROM:  Unrestricted ROM                  Functional ROM: Unrestricted ROM                  Muscle Tone/Strength: Functionally intact. No obvious neuro-muscular anomalies detected.  Muscle Tone/Strength: L5 myotomal (EHL) weakness (Toe Dorsiflexion)  Sensory (Neurological): Unimpaired  Sensory (Neurological): Unimpaired  Palpation: No palpable anomalies  Palpation: No palpable anomalies   Assessment & Plan  Primary Diagnosis & Pertinent Problem List: The primary encounter diagnosis was Chronic pain  syndrome. 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  Visit Diagnosis: 1. Chronic pain syndrome   2. Chronic ankle pain (Primary Area of Pain) (Left)   3. Chronic foot pain (Primary Area of Pain) (Left)   4. Diabetic peripheral neuropathy ([place])   5. Chronic lower extremity pain (Secondary Area of Pain) (Bilateral) (L>R)   6. Lumbar foraminal stenosis (L5-S1) (Right)   7. 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 DDD (degenerative disc disease), lumbar   9. Lumbar facet arthropathy   10. Lumbar facet hypertrophy   11. Lumbar facet syndrome (Bilateral)   12. Chronic knee pain (Fourth Area of Pain) (Bilateral) (L>R)   13. Osteoarthritis of knee (Bilateral)   14. Chronic musculoskeletal pain   15. Vitamin D deficiency    Problems updated and reviewed  during this visit: No problems updated. Time Note: Greater than 50% of the 40 minute(s) of face-to-face time spent with Ms. Sokol, was spent in counseling/coordination of care regarding: the appropriate use of the pain scale, Ms. Valtierra's primary cause of pain, the results of her recent test(s), the significance of each one oth the test(s) anomalies and it's corresponding characteristic pain pattern(s), the treatment plan, treatment alternatives, the risks and possible complications of proposed treatment, medication side effects, the opioid analgesic risks and possible complications, realistic expectations, the goals of pain management (increased in functionality), the medication agreement and the need to collect and read the AVS material. Plan of Care  Pharmacotherapy (Medications Ordered): Meds ordered this encounter  Medications  . ergocalciferol (VITAMIN D2) 50000 units capsule    Sig: Take 1 capsule (50,000 Units total) by mouth 2 (two) times a week. X 6 weeks.    Dispense:  12 capsule    Refill:  0    Do not add this medication to the electronic "Automatic Refill" notification system. Patient may have prescription filled one day early if pharmacy is closed on scheduled refill date.  . Cholecalciferol (VITAMIN D3) 5000 units CAPS    Sig: Take 1 capsule (5,000 Units total) by mouth daily with breakfast. Take along with calcium and magnesium.    Dispense:  30 capsule    Refill:  5    Do not place medication on "Automatic Refill".  May substitute with similar over-the-counter product.  . Magnesium 500 MG CAPS    Sig: Take 1 capsule (500 mg total) by mouth 2 (two) times daily at 8 am and 10 pm.    Dispense:  60 capsule    Refill:  5    Do not place medication on "Automatic Refill".  The patient may use similar over-the-counter product.  . Calcium Carbonate-Vit D-Min (GNP CALCIUM 1200) 1200-1000 MG-UNIT CHEW    Sig: Chew 1,200 mg by mouth daily with breakfast. Take in combination with vitamin  D and magnesium.    Dispense:  30 tablet    Refill:  5    Do not place medication on "Automatic Refill".  May  substitute with similar over-the-counter product.  . traMADol (ULTRAM) 50 MG tablet    Sig: Take 1 tablet (50 mg total) by mouth every 8 (eight) hours as needed for severe pain.    Dispense:  90 tablet    Refill:  0    Medication for Chronic Pain (G89.4). [place] STOP ACT - Not applicable. Fill one day early if pharmacy is closed on scheduled refill date.  Do not fill until: 10/21/17 To last until: 11/20/17  . baclofen (LIORESAL) 10 MG tablet    Sig: Take 1 tablet (10 mg total) by mouth 3 (three) times daily.    Dispense:  90 tablet    Refill:  0    Do not place medication on "Automatic Refill". Fill one day early if pharmacy is closed on scheduled refill date.    Procedure Orders     Lumbar Epidural Injection     Lumbar Transforaminal Epidural Lab Orders  No laboratory test(s) ordered today   Imaging Orders  No imaging studies ordered today   Referral Orders  No referral(s) requested today   Pharmacological management options:  Opioid Analgesics: We'll take over management today. See above orders Membrane stabilizer: We have discussed the possibility of optimizing this mode of therapy, if tolerated Muscle relaxant: We have discussed the possibility of a trial NSAID: We have discussed the possibility of a trial Other analgesic(s): To be determined at a later time   Interventional management options: Planned, scheduled, and/or pending:    Results of nerve conduction test requested. Diagnostic left-sided L4-5 interlaminar LESI under fluoroscopy and IV sedation.   Considering:   Diagnostic left-sided lumbar sympathetic block  Possible left-sided lumbar sympathetic RFA  Diagnostic left-sided L4-5 interlaminar LESI  Diagnostic right-sided L5 transforaminal ESI  Diagnostic bilateral lumbar facet block  Possible bilateral lumbar facet RFA  Diagnostic bilateral  intra-articular knee joint injection with local anesthetic and steroid  Possible series of 5 bilateral intra-articular Hyalgan knee injections  Diagnostic bilateral genicular nerve block  Possible bilateral genicular nerve RFA    PRN Procedures:   None at this time   Provider-requested follow-up: Return for Procedure (w/ sedation): (L) L4-5 LESI + (L) L5 TFES #1.  Future Appointments  Date Time Provider [place]  11/05/2017  8:30 AM Milinda Pointer, MD [facility] None    Primary Care Physician: Tracie Harrier, MD Location: [facility] Note by: Gaspar Cola, MD Date: 10/21/2017; Time: 8:02 AM

## 2017-10-21 ENCOUNTER — Ambulatory Visit: Payer: BLUE CROSS/BLUE SHIELD | Attending: Pain Medicine | Admitting: Pain Medicine

## 2017-10-21 ENCOUNTER — Encounter: Payer: Self-pay | Admitting: Pain Medicine

## 2017-10-21 ENCOUNTER — Other Ambulatory Visit: Payer: Self-pay

## 2017-10-21 VITALS — BP 160/95 | HR 84 | Temp 97.3°F | Ht 59.0 in | Wt 115.0 lb

## 2017-10-21 DIAGNOSIS — G894 Chronic pain syndrome: Secondary | ICD-10-CM

## 2017-10-21 DIAGNOSIS — M170 Bilateral primary osteoarthritis of knee: Secondary | ICD-10-CM | POA: Diagnosis not present

## 2017-10-21 DIAGNOSIS — M25562 Pain in left knee: Secondary | ICD-10-CM

## 2017-10-21 DIAGNOSIS — M51369 Other intervertebral disc degeneration, lumbar region without mention of lumbar back pain or lower extremity pain: Secondary | ICD-10-CM

## 2017-10-21 DIAGNOSIS — M5136 Other intervertebral disc degeneration, lumbar region: Secondary | ICD-10-CM | POA: Diagnosis not present

## 2017-10-21 DIAGNOSIS — Z794 Long term (current) use of insulin: Secondary | ICD-10-CM | POA: Insufficient documentation

## 2017-10-21 DIAGNOSIS — K219 Gastro-esophageal reflux disease without esophagitis: Secondary | ICD-10-CM | POA: Diagnosis not present

## 2017-10-21 DIAGNOSIS — Z79899 Other long term (current) drug therapy: Secondary | ICD-10-CM | POA: Insufficient documentation

## 2017-10-21 DIAGNOSIS — E1142 Type 2 diabetes mellitus with diabetic polyneuropathy: Secondary | ICD-10-CM | POA: Diagnosis not present

## 2017-10-21 DIAGNOSIS — M47816 Spondylosis without myelopathy or radiculopathy, lumbar region: Secondary | ICD-10-CM

## 2017-10-21 DIAGNOSIS — M79604 Pain in right leg: Secondary | ICD-10-CM

## 2017-10-21 DIAGNOSIS — M5441 Lumbago with sciatica, right side: Secondary | ICD-10-CM | POA: Diagnosis not present

## 2017-10-21 DIAGNOSIS — Z79891 Long term (current) use of opiate analgesic: Secondary | ICD-10-CM | POA: Diagnosis not present

## 2017-10-21 DIAGNOSIS — G8929 Other chronic pain: Secondary | ICD-10-CM

## 2017-10-21 DIAGNOSIS — M79605 Pain in left leg: Secondary | ICD-10-CM

## 2017-10-21 DIAGNOSIS — M79672 Pain in left foot: Secondary | ICD-10-CM

## 2017-10-21 DIAGNOSIS — M48061 Spinal stenosis, lumbar region without neurogenic claudication: Secondary | ICD-10-CM

## 2017-10-21 DIAGNOSIS — M25572 Pain in left ankle and joints of left foot: Secondary | ICD-10-CM

## 2017-10-21 DIAGNOSIS — M25561 Pain in right knee: Secondary | ICD-10-CM

## 2017-10-21 DIAGNOSIS — M7918 Myalgia, other site: Secondary | ICD-10-CM

## 2017-10-21 DIAGNOSIS — E559 Vitamin D deficiency, unspecified: Secondary | ICD-10-CM | POA: Diagnosis not present

## 2017-10-21 DIAGNOSIS — M5442 Lumbago with sciatica, left side: Secondary | ICD-10-CM | POA: Diagnosis not present

## 2017-10-21 DIAGNOSIS — I10 Essential (primary) hypertension: Secondary | ICD-10-CM | POA: Insufficient documentation

## 2017-10-21 MED ORDER — VITAMIN D3 125 MCG (5000 UT) PO CAPS: 1.0000 | ORAL_CAPSULE | Freq: Every day | ORAL | 5 refills | Status: DC

## 2017-10-21 MED ORDER — BACLOFEN 10 MG PO TABS: 10.0000 mg | ORAL_TABLET | Freq: Three times a day (TID) | ORAL | 0 refills | Status: DC

## 2017-10-21 MED ORDER — GNP CALCIUM 1200 1200-1000 MG-UNIT PO CHEW: 1200.0000 mg | CHEWABLE_TABLET | Freq: Every day | ORAL | 5 refills | Status: DC

## 2017-10-21 MED ORDER — ERGOCALCIFEROL 1.25 MG (50000 UT) PO CAPS: 50000.0000 [IU] | ORAL_CAPSULE | ORAL | 0 refills | Status: AC

## 2017-10-21 MED ORDER — TRAMADOL HCL 50 MG PO TABS: 50.0000 mg | ORAL_TABLET | Freq: Three times a day (TID) | ORAL | 0 refills | Status: DC | PRN

## 2017-10-21 MED ORDER — MAGNESIUM 500 MG PO CAPS: 500.0000 mg | ORAL_CAPSULE | Freq: Two times a day (BID) | ORAL | 5 refills | Status: DC

## 2017-10-21 NOTE — Patient Instructions (Addendum)
____________________________________________________________________________________________  Pain Scale  Introduction: The pain score used by this practice is the Verbal Numerical Rating Scale (VNRS-11). This is an 11-point scale. It is for adults and children 10 years or older. There are significant differences in how the pain score is reported, used, and applied. Forget everything you learned in the past and learn this scoring system.  General Information: The scale should reflect your current level of pain. Unless you are specifically asked for the level of your worst pain, or your average pain. If you are asked for one of these two, then it should be understood that it is over the past 24 hours.  Basic Activities of Daily Living (ADL): Personal hygiene, dressing, eating, transferring, and using restroom.  Instructions: Most patients tend to report their level of pain as a combination of two factors, their physical pain and their psychosocial pain. This last one is also known as "suffering" and it is reflection of how physical pain affects you socially and psychologically. From now on, report them separately. From this point on, when asked to report your pain level, report only your physical pain. Use the following table for reference.  Pain Clinic Pain Levels (0-5/10)  Pain Level Score  Description  No Pain 0   Mild pain 1 Nagging, annoying, but does not interfere with basic activities of daily living (ADL). Patients are able to eat, bathe, get dressed, toileting (being able to get on and off the toilet and perform personal hygiene functions), transfer (move in and out of bed or a chair without assistance), and maintain continence (able to control bladder and bowel functions). Blood pressure and heart rate are unaffected. A normal heart rate for a healthy adult ranges from 60 to 100 bpm (beats per minute).   Mild to moderate pain 2 Noticeable and distracting. Impossible to hide from other  people. More frequent flare-ups. Still possible to adapt and function close to normal. It can be very annoying and may have occasional stronger flare-ups. With discipline, patients may get used to it and adapt.   Moderate pain 3 Interferes significantly with activities of daily living (ADL). It becomes difficult to feed, bathe, get dressed, get on and off the toilet or to perform personal hygiene functions. Difficult to get in and out of bed or a chair without assistance. Very distracting. With effort, it can be ignored when deeply involved in activities.   Moderately severe pain 4 Impossible to ignore for more than a few minutes. With effort, patients may still be able to manage work or participate in some social activities. Very difficult to concentrate. Signs of autonomic nervous system discharge are evident: dilated pupils (mydriasis); mild sweating (diaphoresis); sleep interference. Heart rate becomes elevated (>115 bpm). Diastolic blood pressure (lower number) rises above 100 mmHg. Patients find relief in laying down and not moving.   Severe pain 5 Intense and extremely unpleasant. Associated with frowning face and frequent crying. Pain overwhelms the senses.  Ability to do any activity or maintain social relationships becomes significantly limited. Conversation becomes difficult. Pacing back and forth is common, as getting into a comfortable position is nearly impossible. Pain wakes you up from deep sleep. Physical signs will be obvious: pupillary dilation; increased sweating; goosebumps; brisk reflexes; cold, clammy hands and feet; nausea, vomiting or dry heaves; loss of appetite; significant sleep disturbance with inability to fall asleep or to remain asleep. When persistent, significant weight loss is observed due to the complete loss of appetite and sleep deprivation.  Blood   pressure and heart rate becomes significantly elevated. Caution: If elevated blood pressure triggers a pounding headache  associated with blurred vision, then the patient should immediately seek attention at an urgent or emergency care unit, as these may be signs of an impending stroke.    Emergency Department Pain Levels (6-10/10)  Emergency Room Pain 6 Severely limiting. Requires emergency care and should not be seen or managed at an outpatient pain management facility. Communication becomes difficult and requires great effort. Assistance to reach the emergency department may be required. Facial flushing and profuse sweating along with potentially dangerous increases in heart rate and blood pressure will be evident.   Distressing pain 7 Self-care is very difficult. Assistance is required to transport, or use restroom. Assistance to reach the emergency department will be required. Tasks requiring coordination, such as bathing and getting dressed become very difficult.   Disabling pain 8 Self-care is no longer possible. At this level, pain is disabling. The individual is unable to do even the most "basic" activities such as walking, eating, bathing, dressing, transferring to a bed, or toileting. Fine motor skills are lost. It is difficult to think clearly.   Incapacitating pain 9 Pain becomes incapacitating. Thought processing is no longer possible. Difficult to remember your own name. Control of movement and coordination are lost.   The worst pain imaginable 10 At this level, most patients pass out from pain. When this level is reached, collapse of the autonomic nervous system occurs, leading to a sudden drop in blood pressure and heart rate. This in turn results in a temporary and dramatic drop in blood flow to the brain, leading to a loss of consciousness. Fainting is one of the body's self defense mechanisms. Passing out puts the brain in a calmed state and causes it to shut down for a while, in order to begin the healing process.    Summary: 1. Refer to this scale when providing us with your pain level. 2. Be  accurate and careful when reporting your pain level. This will help with your care. 3. Over-reporting your pain level will lead to loss of credibility. 4. Even a level of 1/10 means that there is pain and will be treated at our facility. 5. High, inaccurate reporting will be documented as "Symptom Exaggeration", leading to loss of credibility and suspicions of possible secondary gains such as obtaining more narcotics, or wanting to appear disabled, for fraudulent reasons. 6. Only pain levels of 5 or below will be seen at our facility. 7. Pain levels of 6 and above will be sent to the Emergency Department and the appointment cancelled. ____________________________________________________________________________________________   ____________________________________________________________________________________________  Preparing for Procedure with Sedation  Instructions: . Oral Intake: Do not eat or drink anything for at least 8 hours prior to your procedure. . Transportation: Public transportation is not allowed. Bring an adult driver. The driver must be physically present in our waiting room before any procedure can be started. . Physical Assistance: Bring an adult physically capable of assisting you, in the event you need help. This adult should keep you company at home for at least 6 hours after the procedure. . Blood Pressure Medicine: Take your blood pressure medicine with a sip of water the morning of the procedure. . Blood thinners: Notify our staff if you are taking any blood thinners. Depending on which one you take, there will be specific instructions on how and when to stop it. . Diabetics on insulin: Notify the staff so that you can be   scheduled 1st case in the morning. If your diabetes requires high dose insulin, take only  of your normal insulin dose the morning of the procedure and notify the staff that you have done so. . Preventing infections: Shower with an antibacterial soap  the morning of your procedure. . Build-up your immune system: Take 1000 mg of Vitamin C with every meal (3 times a day) the day prior to your procedure. Marland Kitchen Antibiotics: Inform the staff if you have a condition or reason that requires you to take antibiotics before dental procedures. . Pregnancy: If you are pregnant, call and cancel the procedure. . Sickness: If you have a cold, fever, or any active infections, call and cancel the procedure. . Arrival: You must be in the facility at least 30 minutes prior to your scheduled procedure. . Children: Do not bring children with you. . Dress appropriately: Bring dark clothing that you would not mind if they get stained. . Valuables: Do not bring any jewelry or valuables.  Procedure appointments are reserved for interventional treatments only. Marland Kitchen No Prescription Refills. . No medication changes will be discussed during procedure appointments. . No disability issues will be discussed.  Reasons to call and reschedule or cancel your procedure: (Following these recommendations will minimize the risk of a serious complication.) . Surgeries: Avoid having procedures within 2 weeks of any surgery. (Avoid for 2 weeks before or after any surgery). . Flu Shots: Avoid having procedures within 2 weeks of a flu shots or . (Avoid for 2 weeks before or after immunizations). . Barium: Avoid having a procedure within 7-10 days after having had a radiological study involving the use of radiological contrast. (Myelograms, Barium swallow or enema study). . Heart attacks: Avoid any elective procedures or surgeries for the initial 6 months after a "Myocardial Infarction" (Heart Attack). . Blood thinners: It is imperative that you stop these medications before procedures. Let us know if you if you take any blood thinner.  . Infection: Avoid procedures during or within two weeks of an infection (including chest colds or gastrointestinal problems). Symptoms associated with  infections include: Localized redness, fever, chills, night sweats or profuse sweating, burning sensation when voiding, cough, congestion, stuffiness, runny nose, sore throat, diarrhea, nausea, vomiting, cold or Flu symptoms, recent or current infections. It is specially important if the infection is over the area that we intend to treat. Marland Kitchen Heart and lung problems: Symptoms that may suggest an active cardiopulmonary problem include: cough, chest pain, breathing difficulties or shortness of breath, dizziness, ankle swelling, uncontrolled high or unusually low blood pressure, and/or palpitations. If you are experiencing any of these symptoms, cancel your procedure and contact your primary care physician for an evaluation.  Remember:  Regular Business hours are:  Monday to Thursday 8:00 AM to 4:00 PM  Provider's Schedule: Milinda Pointer, MD:  Procedure days: Tuesday and Thursday 7:30 AM to 4:00 PM  Gillis Santa, MD:  Procedure days: Monday and Wednesday 7:30 AM to 4:00 PM ____________________________________________________________________________________________   ____________________________________________________________________________________________  Medication Rules  Applies to: All patients receiving prescriptions (written or electronic).  Pharmacy of record: Pharmacy where electronic prescriptions will be sent. If written prescriptions are taken to a different pharmacy, please inform the nursing staff. The pharmacy listed in the electronic medical record should be the one where you would like electronic prescriptions to be sent.  Prescription refills: Only during scheduled appointments. Applies to both, written and electronic prescriptions.  NOTE: The following applies primarily to controlled substances (Opioid* Pain Medications).  Patient's responsibilities: 1. Pain Pills: Bring all pain pills to every appointment (except for procedure appointments). 2. Pill Bottles: Bring  pills in original pharmacy bottle. Always bring newest bottle. Bring bottle, even if empty. 3. Medication refills: You are responsible for knowing and keeping track of what medications you need refilled. The day before your appointment, write a list of all prescriptions that need to be refilled. Bring that list to your appointment and give it to the admitting nurse. Prescriptions will be written only during appointments. If you forget a medication, it will not be "Called in", "Faxed", or "electronically sent". You will need to get another appointment to get these prescribed. 4. Prescription Accuracy: You are responsible for carefully inspecting your prescriptions before leaving our office. Have the discharge nurse carefully go over each prescription with you, before taking them home. Make sure that your name is accurately spelled, that your address is correct. Check the name and dose of your medication to make sure it is accurate. Check the number of pills, and the written instructions to make sure they are clear and accurate. Make sure that you are given enough medication to last until your next medication refill appointment. 5. Taking Medication: Take medication as prescribed. Never take more pills than instructed. Never take medication more frequently than prescribed. Taking less pills or less frequently is permitted and encouraged, when it comes to controlled substances (written prescriptions).  6. Inform other Doctors: Always inform, all of your healthcare providers, of all the medications you take. 7. Pain Medication from other Providers: You are not allowed to accept any additional pain medication from any other Doctor or Healthcare provider. There are two exceptions to this rule. (see below) In the event that you require additional pain medication, you are responsible for notifying us, as stated below. 8. Medication Agreement: You are responsible for carefully reading and following our Medication  Agreement. This must be signed before receiving any prescriptions from our practice. Safely store a copy of your signed Agreement. Violations to the Agreement will result in no further prescriptions. (Additional copies of our Medication Agreement are available upon request.) 9. Laws, Rules, & Regulations: All patients are expected to follow all Federal and [employer], [employer], Rules, [employer]. Ignorance of the Laws does not constitute a valid excuse. The use of any illegal substances is prohibited. 10. Adopted CDC guidelines & recommendations: Target dosing levels will be at or below 60 MME/day. Use of benzodiazepines** is not recommended.  Exceptions: There are only two exceptions to the rule of not receiving pain medications from other Healthcare Providers. 1. Exception #1 (Emergencies): In the event of an emergency (i.e.: accident requiring emergency care), you are allowed to receive additional pain medication. However, you are responsible for: As soon as you are able, call our office [phone], at any time of the day or night, and leave a message stating your name, the date and nature of the emergency, and the name and dose of the medication prescribed. In the event that your call is answered by a member of our staff, make sure to document and save the date, time, and the name of the person that took your information.  2. Exception #2 (Planned Surgery): In the event that you are scheduled by another doctor or dentist to have any type of surgery or procedure, you are allowed (for a period no longer than 30 days), to receive additional pain medication, for the acute post-op pain. However, in this case, you are responsible  for picking up a copy of our "Post-op Pain Management for Surgeons" handout, and giving it to your surgeon or dentist. This document is available at our office, and does not require an appointment to obtain it. Simply go to our office during business hours (Monday-Thursday from  8:00 AM to 4:00 PM) (Friday 8:00 AM to 12:00 Noon) or if you have a scheduled appointment with [place], prior to your surgery, and ask for it by name. In addition, you will need to provide [place] with your name, name of your surgeon, type of surgery, and date of procedure or surgery.  *Opioid medications include: morphine, codeine, oxycodone, oxymorphone, hydrocodone, hydromorphone, meperidine, tramadol, tapentadol, buprenorphine, fentanyl, methadone. **Benzodiazepine medications include: diazepam (Valium), alprazolam (Xanax), clonazepam (Klonopine), lorazepam (Ativan), clorazepate (Tranxene), chlordiazepoxide (Librium), estazolam (Prosom), oxazepam (Serax), temazepam (Restoril), triazolam (Halcion) (Last updated: 03/19/2017) ____________________________________________________________________________________________   ____________________________________________________________________________________________  Medication Recommendations and Reminders  Applies to: All patients receiving prescriptions (written and/or electronic).  Medication Rules & Regulations: These rules and regulations exist for your safety and that of others. They are not flexible and neither are we. Dismissing or ignoring them will be considered "non-compliance" with medication therapy, resulting in complete and irreversible termination of such therapy. (See document titled "Medication Rules" for more details.) In all conscience, because of safety reasons, we cannot continue providing a therapy where the patient does not follow instructions.  Pharmacy of record:   Definition: This is the pharmacy where your electronic prescriptions will be sent.   We do not endorse any particular pharmacy.  You are not restricted in your choice of pharmacy.  The pharmacy listed in the electronic medical record should be the one where you want electronic prescriptions to be sent.  If you choose to change pharmacy, simply notify our nursing staff  of your choice of new pharmacy.  Recommendations:  Keep all of your pain medications in a safe place, under lock and key, even if you live alone.   After you fill your prescription, take 1 week's worth of pills and put them away in a safe place. You should keep a separate, properly labeled bottle for this purpose. The remainder should be kept in the original bottle. Use this as your primary supply, until it runs out. Once it's gone, then you know that you have 1 week's worth of medicine, and it is time to come in for a prescription refill. If you do this correctly, it is unlikely that you will ever run out of medicine.  To make sure that the above recommendation works, it is very important that you make sure your medication refill appointments are scheduled at least 1 week before you run out of medicine. To do this in an effective manner, make sure that you do not leave the office without scheduling your next medication management appointment. Always ask the nursing staff to show you in your prescription , when your medication will be running out. Then arrange for the receptionist to get you a return appointment, at least 7 days before you run out of medicine. Do not wait until you have 1 or 2 pills left, to come in. This is very poor planning and does not take into consideration that we may need to cancel appointments due to bad weather, sickness, or emergencies affecting our staff.  "Partial Fill": If for any reason your pharmacy does not have enough pills/tablets to completely fill or refill your prescription, do not allow for a "partial fill". You will need a separate prescription to  fill the remaining amount, which we will not provide. If the reason for the partial fill is your insurance, you will need to talk to the pharmacist about payment alternatives for the remaining tablets, but again, do not accept a partial fill.  Prescription refills and/or changes in medication(s):   Prescription  refills, and/or changes in dose or medication, will be conducted only during scheduled medication management appointments. (Applies to both, written and electronic prescriptions.)  No refills on procedure days. No medication will be changed or started on procedure days. No changes, adjustments, and/or refills will be conducted on a procedure day. Doing so will interfere with the diagnostic portion of the procedure.  No phone refills. No medications will be "called into the pharmacy".  No Fax refills.  No weekend refills.  No Holliday refills.  No after hours refills.  Remember:  Business hours are:  Monday to Thursday 8:00 AM to 4:00 PM Provider's Schedule: Dionisio David, NP - Appointments are:  Medication management: Monday to Thursday 8:00 AM to 4:00 PM Milinda Pointer, MD - Appointments are:  Medication management: Monday and Wednesday 8:00 AM to 4:00 PM Procedure day: Tuesday and Thursday 7:30 AM to 4:00 PM Gillis Santa, MD - Appointments are:  Medication management: Tuesday and Thursday 8:00 AM to 4:00 PM Procedure day: Monday and Wednesday 7:30 AM to 4:00 PM (Last update: 03/19/2017) ____________________________________________________________________________________________   ____________________________________________________________________________________________  CANNABIDIOL (AKA: CBD Oil or Pills)  Applies to: All patients receiving prescriptions of controlled substances (written and/or electronic).  General Information: Cannabidiol (CBD) was discovered in 6. It is one of some 113 identified cannabinoids in cannabis (Marijuana) plants, accounting for up to 40% of the plant's extract. As of 2018, preliminary clinical research on cannabidiol included studies of anxiety, cognition, movement disorders, and pain.  Cannabidiol is consummed in multiple ways, including inhalation of cannabis smoke or vapor, as an aerosol spray into the cheek, and by mouth. It may be  supplied as CBD oil containing CBD as the active ingredient (no added tetrahydrocannabinol (THC) or terpenes), a full-plant CBD-dominant hemp extract oil, capsules, dried cannabis, or as a liquid solution. CBD is thought not have the same psychoactivity as THC, and may affect the actions of THC. Studies suggest that CBD may interact with different biological targets, including cannabinoid receptors and other neurotransmitter receptors. As of 2018 the mechanism of action for its biological effects has not been determined.  In the [place], cannabidiol has a limited approval by the Food and Drug Administration (FDA) for treatment of only two types of epilepsy disorders. The side effects of long-term use of the drug include somnolence, decreased appetite, diarrhea, fatigue, malaise, weakness, sleeping problems, and others.  CBD remains a Schedule I drug prohibited for any use.  Legality: Some manufacturers ship CBD products nationally, an illegal action which the FDA has not enforced in 2018, with CBD remaining the subject of an FDA investigational new drug evaluation, and is not considered legal as a dietary supplement or food ingredient as of December 2018. Federal illegality has made it difficult historically to conduct research on CBD. CBD is openly sold in head shops and health food stores in some states where such sales have not been explicitly legalized.  Warning: Because it is not FDA approved for general use or treatment of pain, it is not required to undergo the same manufacturing controls as prescription drugs.  This means that the available cannabidiol (CBD) may be contaminated with THC.  If this is the case, it  will trigger a positive urine drug screen (UDS) test for cannabinoids (Marijuana).  Because a positive UDS for illicit substances is a violation of our medication agreement, your opioid analgesics (pain medicine) may be permanently discontinued. (Last update:  04/09/2017) ____________________________________________________________________________________________

## 2017-10-22 ENCOUNTER — Telehealth: Payer: Self-pay | Admitting: Pain Medicine

## 2017-10-22 NOTE — Telephone Encounter (Signed)
No answer LVM for patient to call back :  PATIENT CANNOT FILL TRAMADOL WRITTEN YESTERDAY BECAUSE SHE JUST FILLED ONE FROM ANOTHER PHYSICIAN ON 10/13/2017 FOR THE SAME AMOUNT. PHARMACY INSTRUCTED NOT TO FILL OUR SCRIPT UNTIL 11/12/2017.

## 2017-10-22 NOTE — Telephone Encounter (Signed)
Patient states she got a script for Tramadol but cannot get it filled because she recently got script refilled, wants to speak with someone about this.

## 2017-11-05 ENCOUNTER — Ambulatory Visit (HOSPITAL_BASED_OUTPATIENT_CLINIC_OR_DEPARTMENT_OTHER): Payer: BLUE CROSS/BLUE SHIELD | Admitting: Pain Medicine

## 2017-11-05 ENCOUNTER — Ambulatory Visit
Admission: RE | Admit: 2017-11-05 | Discharge: 2017-11-05 | Disposition: A | Discharge status: Home / Self Care | Payer: BLUE CROSS/BLUE SHIELD | Source: Ambulatory Visit | Attending: Pain Medicine | Admitting: Pain Medicine

## 2017-11-05 ENCOUNTER — Encounter: Payer: Self-pay | Admitting: Pain Medicine

## 2017-11-05 ENCOUNTER — Other Ambulatory Visit: Payer: Self-pay

## 2017-11-05 VITALS — BP 162/72 | HR 72 | Temp 97.7°F | Resp 12 | Ht 59.0 in | Wt 118.0 lb

## 2017-11-05 DIAGNOSIS — M5136 Other intervertebral disc degeneration, lumbar region: Secondary | ICD-10-CM | POA: Insufficient documentation

## 2017-11-05 DIAGNOSIS — M48061 Spinal stenosis, lumbar region without neurogenic claudication: Secondary | ICD-10-CM

## 2017-11-05 DIAGNOSIS — G8929 Other chronic pain: Secondary | ICD-10-CM | POA: Diagnosis present

## 2017-11-05 DIAGNOSIS — R200 Anesthesia of skin: Secondary | ICD-10-CM | POA: Diagnosis not present

## 2017-11-05 DIAGNOSIS — M79605 Pain in left leg: Secondary | ICD-10-CM | POA: Diagnosis not present

## 2017-11-05 DIAGNOSIS — M51369 Other intervertebral disc degeneration, lumbar region without mention of lumbar back pain or lower extremity pain: Secondary | ICD-10-CM

## 2017-11-05 DIAGNOSIS — M79604 Pain in right leg: Secondary | ICD-10-CM | POA: Diagnosis not present

## 2017-11-05 DIAGNOSIS — R202 Paresthesia of skin: Secondary | ICD-10-CM | POA: Insufficient documentation

## 2017-11-05 MED ORDER — SODIUM CHLORIDE 0.9% FLUSH
2.0000 mL | Freq: Once | INTRAVENOUS | Status: AC
  Administered 2017-11-05: 2 mL

## 2017-11-05 MED ORDER — SODIUM CHLORIDE 0.9 % IJ SOLN
INTRAMUSCULAR | Status: AC
  Filled 2017-11-05: qty 10

## 2017-11-05 MED ORDER — MIDAZOLAM HCL 5 MG/5ML IJ SOLN
1.0000 mg | INTRAMUSCULAR | Status: DC | PRN
  Administered 2017-11-05: 1 mg via INTRAVENOUS
  Filled 2017-11-05: qty 5

## 2017-11-05 MED ORDER — ROPIVACAINE HCL 2 MG/ML IJ SOLN
1.0000 mL | Freq: Once | INTRAMUSCULAR | Status: AC
  Administered 2017-11-05: 3 mL via EPIDURAL
  Filled 2017-11-05: qty 10

## 2017-11-05 MED ORDER — IOPAMIDOL (ISOVUE-M 200) INJECTION 41%
10.0000 mL | Freq: Once | INTRAMUSCULAR | Status: AC
  Administered 2017-11-05: 10 mL via EPIDURAL
  Filled 2017-11-05: qty 10

## 2017-11-05 MED ORDER — ROPIVACAINE HCL 2 MG/ML IJ SOLN
2.0000 mL | Freq: Once | INTRAMUSCULAR | Status: AC
  Administered 2017-11-05: 2 mL via EPIDURAL

## 2017-11-05 MED ORDER — LIDOCAINE HCL 2 % IJ SOLN
20.0000 mL | Freq: Once | INTRAMUSCULAR | Status: AC
  Administered 2017-11-05: 400 mg
  Filled 2017-11-05: qty 40

## 2017-11-05 MED ORDER — TRIAMCINOLONE ACETONIDE 40 MG/ML IJ SUSP
40.0000 mg | Freq: Once | INTRAMUSCULAR | Status: AC
  Administered 2017-11-05: 40 mg
  Filled 2017-11-05: qty 1

## 2017-11-05 MED ORDER — SODIUM CHLORIDE 0.9% FLUSH
1.0000 mL | Freq: Once | INTRAVENOUS | Status: AC
  Administered 2017-11-05: 1 mL

## 2017-11-05 MED ORDER — DEXAMETHASONE SODIUM PHOSPHATE 10 MG/ML IJ SOLN
10.0000 mg | Freq: Once | INTRAMUSCULAR | Status: AC
  Administered 2017-11-05: 10 mg
  Filled 2017-11-05: qty 1

## 2017-11-05 MED ORDER — FENTANYL CITRATE (PF) 100 MCG/2ML IJ SOLN
25.0000 ug | INTRAMUSCULAR | Status: DC | PRN
  Administered 2017-11-05: 50 ug via INTRAVENOUS
  Filled 2017-11-05: qty 2

## 2017-11-05 MED ORDER — LACTATED RINGERS IV SOLN
1000.0000 mL | Freq: Once | INTRAVENOUS | Status: AC
  Administered 2017-11-05: 1000 mL via INTRAVENOUS

## 2017-11-05 NOTE — Progress Notes (Signed)
Patient's Name: Charlene [name]  MRN: [id number]  Referring Provider: Tracie Harrier, MD  DOB: 08-23-1960  PCP: Tracie Harrier, MD  DOS: 11/05/2017  Note by: Gaspar Cola, MD  Service setting: Ambulatory outpatient  Specialty: Interventional Pain Management  Patient type: Established  Location: [facility] (AMB) Pain Management Facility  Visit type: Interventional Procedure   Primary Reason for Visit: Interventional Pain Management Treatment. CC: Leg Pain (left upper and lower, right at knee area)  Procedure #1:  Anesthesia, Analgesia, Anxiolysis:  Type: Diagnostic Trans-Foraminal Epidural Steroid Injection   #1  Region: Lumbar Level: L5 Paravertebral Laterality: Left-Sided Paravertebral   Type: Moderate (Conscious) Sedation combined with Local Anesthesia Indication(s): Analgesia and Anxiety Route: Intravenous (IV) IV Access: Secured Sedation: Meaningful verbal contact was maintained at all times during the procedure  Local Anesthetic: Lidocaine 1-2%  Position: Prone  Procedure #2:    Type: Diagnostic Inter-Laminar Epidural Steroid Injection   #1  Region: Lumbar Level: L4-5 Level. Laterality: Left-Sided Paramedial     Indications: 1. DDD (degenerative disc disease), lumbar   2. Lumbar foraminal stenosis (L5-S1) (Right)   3. Lower extremity numbness and tingling (Left)   4. Chronic lower extremity pain (Secondary Area of Pain) (Bilateral) (L>R)    Pain Score: Pre-procedure: 4 /10 Post-procedure: 0-No pain/10  Pre-op Assessment:  Ms. Tuohy is a 57 y.o. (year old), female patient, seen today for interventional treatment. She  has a past surgical history that includes arthroscopic rotator cuff; Colonoscopy; Abdominal hysterectomy; and endosopic sinus. Ms. Ballester has a current medication list which includes the following prescription(s): baclofen, gnp calcium 1200, vitamin d3, duloxetine, ergocalciferol, fluticasone, gabapentin, insulin aspart, insulin glargine, lidocaine,  lisinopril-hydrochlorothiazide, magnesium, metformin, metoprolol succinate, omeprazole, pantoprazole, potassium chloride sa, and tramadol, and the following Facility-Administered Medications: fentanyl and midazolam. Her primarily concern today is the Leg Pain (left upper and lower, right at knee area)  Initial Vital Signs:  Pulse/HCG Rate: 72ECG Heart Rate: 91 Temp: 98.7 F (37.1 C) Resp: 16 BP: (!) 176/87 SpO2: 100 %  BMI: Estimated body mass index is 23.83 kg/m as calculated from the following:   Height as of this encounter: 4\' 11"  (1.499 m).   Weight as of this encounter: 118 lb (53.5 kg).  Risk Assessment: Allergies: Reviewed. She has No Known Allergies.  Allergy Precautions: None required Coagulopathies: Reviewed. None identified.  Blood-thinner therapy: None at this time Active Infection(s): Reviewed. None identified. Ms. Bayley is afebrile  Site Confirmation: Ms. Neyer was asked to confirm the procedure and laterality before marking the site Procedure checklist: Completed Consent: Before the procedure and under the influence of no sedative(s), amnesic(s), or anxiolytics, the patient was informed of the treatment options, risks and possible complications. To fulfill our ethical and legal obligations, as recommended by the American Medical Association's Code of Ethics, I have informed the patient of my clinical impression; the nature and purpose of the treatment or procedure; the risks, benefits, and possible complications of the intervention; the alternatives, including doing nothing; the risk(s) and benefit(s) of the alternative treatment(s) or procedure(s); and the risk(s) and benefit(s) of doing nothing. The patient was provided information about the general risks and possible complications associated with the procedure. These may include, but are not limited to: failure to achieve desired goals, infection, bleeding, organ or nerve damage, allergic reactions, paralysis, and  death. In addition, the patient was informed of those risks and complications associated to Spine-related procedures, such as failure to decrease pain; infection (i.e.: Meningitis, epidural or intraspinal abscess); bleeding (  i.e.: epidural hematoma, subarachnoid hemorrhage, or any other type of intraspinal or peri-dural bleeding); organ or nerve damage (i.e.: Any type of peripheral nerve, nerve root, or spinal cord injury) with subsequent damage to sensory, motor, and/or autonomic systems, resulting in permanent pain, numbness, and/or weakness of one or several areas of the body; allergic reactions; (i.e.: anaphylactic reaction); and/or death. Furthermore, the patient was informed of those risks and complications associated with the medications. These include, but are not limited to: allergic reactions (i.e.: anaphylactic or anaphylactoid reaction(s)); adrenal axis suppression; blood sugar elevation that in diabetics may result in ketoacidosis or comma; water retention that in patients with history of congestive heart failure may result in shortness of breath, pulmonary edema, and decompensation with resultant heart failure; weight gain; swelling or edema; medication-induced neural toxicity; particulate matter embolism and blood vessel occlusion with resultant organ, and/or nervous system infarction; and/or aseptic necrosis of one or more joints. Finally, the patient was informed that Medicine is not an exact science; therefore, there is also the possibility of unforeseen or unpredictable risks and/or possible complications that may result in a catastrophic outcome. The patient indicated having understood very clearly. We have given the patient no guarantees and we have made no promises. Enough time was given to the patient to ask questions, all of which were answered to the patient's satisfaction. Ms. Alaniz has indicated that she wanted to continue with the procedure. Attestation: I, the ordering provider,  attest that I have discussed with the patient the benefits, risks, side-effects, alternatives, likelihood of achieving goals, and potential problems during recovery for the procedure that I have provided informed consent. Date  Time: 11/05/2017  8:15 AM  Pre-Procedure Preparation:  Monitoring: As per clinic protocol. Respiration, ETCO2, SpO2, BP, heart rate and rhythm monitor placed and checked for adequate function Safety Precautions: Patient was assessed for positional comfort and pressure points before starting the procedure. Time-out: I initiated and conducted the "Time-out" before starting the procedure, as per protocol. The patient was asked to participate by confirming the accuracy of the "Time Out" information. Verification of the correct person, site, and procedure were performed and confirmed by me, the nursing staff, and the patient. "Time-out" conducted as per Joint Commission's Universal Protocol (UP.01.01.01). Time: 5732  Description of Procedure #1:  Target Area: The inferior and lateral portion of the pedicle, just lateral to a line created by the 6:00 position of the pedicle and the superior articular process of the vertebral body below. On the lateral view, this target lies just posterior to the anterior aspect of the lamina and posterior to the midpoint created between the anterior and the posterior aspect of the neural foramina. Approach: Posterior paravertebral approach. Area Prepped: Entire Posterior Lumbosacral Region Prepping solution: ChloraPrep (2% chlorhexidine gluconate and 70% isopropyl alcohol) Safety Precautions: Aspiration looking for blood return was conducted prior to all injections. At no point did we inject any substances, as a needle was being advanced. No attempts were made at seeking any paresthesias. Safe injection practices and needle disposal techniques used. Medications properly checked for expiration dates. SDV (single dose vial) medications  used.  Description of the Procedure: Protocol guidelines were followed. The patient was placed in position over the fluoroscopy table. The target area was identified and the area prepped in the usual manner. Skin & deeper tissues infiltrated with local anesthetic. Appropriate amount of time allowed to pass for local anesthetics to take effect. The procedure needles were then advanced to the target area. Proper needle placement  secured. Negative aspiration confirmed. Solution injected in intermittent fashion, asking for systemic symptoms every 0.2cc of injectate. The needles were then removed and the area cleansed, making sure to leave some of the prepping solution back to take advantage of its long term bactericidal properties.  Start Time: 0854 hrs.  Materials:  Needle(s) Type: Spinal Needle Gauge: 22G Length: 3.5-in Medication(s): Please see orders for medications and dosing details.  Description of Procedure #2:  Target Area: The  interlaminar space, initially targeting the lower border of the superior vertebral body lamina. Approach: Posterior paramedial approach. Area Prepped: Same as above Prepping solution: Same as above Safety Precautions: Same as above  Description of the Procedure: Protocol guidelines were followed. The patient was placed in position over the fluoroscopy table. The target area was identified and the area prepped in the usual manner. Skin & deeper tissues infiltrated with local anesthetic. Appropriate amount of time allowed to pass for local anesthetics to take effect. The procedure needle was introduced through the skin, ipsilateral to the reported pain, and advanced to the target area. Bone was contacted and the needle walked caudad, until the lamina was cleared. The ligamentum flavum was engaged and loss-of-resistance technique used as the epidural needle was advanced. The epidural space was identified using "loss-of-resistance technique" with 2-3 ml of PF-NaCl (0.9%  NSS), in a 5cc LOR glass syringe. Proper needle placement secured. Negative aspiration confirmed. Solution injected in intermittent fashion, asking for systemic symptoms every 0.5cc of injectate. The needles were then removed and the area cleansed, making sure to leave some of the prepping solution back to take advantage of its long term bactericidal properties.  Vitals:   11/05/17 0905 11/05/17 0915 11/05/17 0925 11/05/17 0935  BP: (!) 151/90 (!) 158/79 (!) 157/79 (!) 162/72  Pulse:      Resp: 10 10 12 12   Temp:  97.9 F (36.6 C)  97.7 F (36.5 C)  TempSrc:      SpO2: 96% 98% 98% 99%  Weight:      Height:        End Time: 0904 hrs.  Materials:  Needle(s) Type: Epidural needle Gauge: 17G Length: 3.5-in Medication(s): Please see orders for medications and dosing details.  Imaging Guidance (Spinal):          Type of Imaging Technique: Fluoroscopy Guidance (Spinal) Indication(s): Assistance in needle guidance and placement for procedures requiring needle placement in or near specific anatomical locations not easily accessible without such assistance. Exposure Time: Please see nurses notes. Contrast: Before injecting any contrast, we confirmed that the patient did not have an allergy to iodine, shellfish, or radiological contrast. Once satisfactory needle placement was completed at the desired level, radiological contrast was injected. Contrast injected under live fluoroscopy. No contrast complications. See chart for type and volume of contrast used. Fluoroscopic Guidance: I was personally present during the use of fluoroscopy. "Tunnel Vision Technique" used to obtain the best possible view of the target area. Parallax error corrected before commencing the procedure. "Direction-depth-direction" technique used to introduce the needle under continuous pulsed fluoroscopy. Once target was reached, antero-posterior, oblique, and lateral fluoroscopic projection used confirm needle placement in all  planes. Images permanently stored in EMR. Interpretation: I personally interpreted the imaging intraoperatively. Adequate needle placement confirmed in multiple planes. Appropriate spread of contrast into desired area was observed. No evidence of afferent or efferent intravascular uptake. No intrathecal or subarachnoid spread observed. Permanent images saved into the patient's record.  Antibiotic Prophylaxis:   Anti-infectives (From admission, onward)  None     Indication(s): None identified  Post-operative Assessment:  Post-procedure Vital Signs:  Pulse/HCG Rate: 7280 Temp: 97.7 F (36.5 C) Resp: 12 BP: (!) 162/72 SpO2: 99 %  EBL: None  Complications: No immediate post-treatment complications observed by team, or reported by patient.  Note: The patient tolerated the entire procedure well. A repeat set of vitals were taken after the procedure and the patient was kept under observation following institutional policy, for this type of procedure. Post-procedural neurological assessment was performed, showing return to baseline, prior to discharge. The patient was provided with post-procedure discharge instructions, including a section on how to identify potential problems. Should any problems arise concerning this procedure, the patient was given instructions to immediately contact us, at any time, without hesitation. In any case, we plan to contact the patient by telephone for a follow-up status report regarding this interventional procedure.  Comments:  No additional relevant information.  Plan of Care    Imaging Orders     DG C-Arm 1-60 Min-No Report  Procedure Orders     Lumbar Epidural Injection     Lumbar Transforaminal Epidural  Medications ordered for procedure: Meds ordered this encounter  Medications  . iopamidol (ISOVUE-M) 41 % intrathecal injection 10 mL    Must be Myelogram-compatible. If not available, you may substitute with a water-soluble, non-ionic,  hypoallergenic, myelogram-compatible radiological contrast medium.  Marland Kitchen lidocaine (XYLOCAINE) 2 % (with pres) injection 400 mg  . midazolam (VERSED) 5 MG/5ML injection 1-2 mg    Make sure Flumazenil is available in the pyxis when using this medication. If oversedation occurs, administer 0.2 mg IV over 15 sec. If after 45 sec no response, administer 0.2 mg again over 1 min; may repeat at 1 min intervals; not to exceed 4 doses (1 mg)  . fentaNYL (SUBLIMAZE) injection 25-50 mcg    Make sure Narcan is available in the pyxis when using this medication. In the event of respiratory depression (RR< 8/min): Titrate NARCAN (naloxone) in increments of 0.1 to 0.2 mg IV at 2-3 minute intervals, until desired degree of reversal.  . lactated ringers infusion 1,000 mL  . sodium chloride flush (NS) 0.9 % injection 1 mL  . ropivacaine (PF) 2 mg/mL (0.2%) (NAROPIN) injection 1 mL  . dexamethasone (DECADRON) injection 10 mg  . sodium chloride flush (NS) 0.9 % injection 2 mL  . ropivacaine (PF) 2 mg/mL (0.2%) (NAROPIN) injection 2 mL  . triamcinolone acetonide (KENALOG-40) injection 40 mg   Medications administered: We administered iopamidol, lidocaine, midazolam, fentaNYL, lactated ringers, sodium chloride flush, ropivacaine (PF) 2 mg/mL (0.2%), dexamethasone, sodium chloride flush, ropivacaine (PF) 2 mg/mL (0.2%), and triamcinolone acetonide.  See the medical record for exact dosing, route, and time of administration.  Disposition: Discharge home  Discharge Date & Time: 11/05/2017; 0936 hrs.   Physician-requested Follow-up: Return for post-procedure eval (2 wks), w/ Dr. Dossie Arbour.  Future Appointments  Date Time Provider [place]  11/23/2017 10:30 AM Milinda Pointer, MD [facility] None   Primary Care Physician: Tracie Harrier, MD Location: [facility] Note by: Gaspar Cola, MD Date: 11/05/2017; Time: 8:16 AM  Disclaimer:  Medicine is not an Chief Strategy Officer.  The only guarantee in medicine is that nothing is guaranteed. It is important to note that the decision to proceed with this intervention was based on the information collected from the patient. The Data and conclusions were drawn from the patient's questionnaire, the interview, and the physical examination. Because the information was provided  in large part by the patient, it cannot be guaranteed that it has not been purposely or unconsciously manipulated. Every effort has been made to obtain as much relevant data as possible for this evaluation. It is important to note that the conclusions that lead to this procedure are derived in large part from the available data. Always take into account that the treatment will also be dependent on availability of resources and existing treatment guidelines, considered by other Pain Management Practitioners as being common knowledge and practice, at the time of the intervention. For Medico-Legal purposes, it is also important to point out that variation in procedural techniques and pharmacological choices are the acceptable norm. The indications, contraindications, technique, and results of the above procedure should only be interpreted and judged by a Board-Certified Interventional Pain Specialist with extensive familiarity and expertise in the same exact procedure and technique.

## 2017-11-05 NOTE — Patient Instructions (Signed)
____________________________________________________________________________________________  Post-Procedure Discharge Instructions  Instructions:  Apply ice: Fill a plastic sandwich bag with crushed ice. Cover it with a small towel and apply to injection site. Apply for 15 minutes then remove x 15 minutes. Repeat sequence on day of procedure, until you go to bed. The purpose is to minimize swelling and discomfort after procedure.  Apply heat: Apply heat to procedure site starting the day following the procedure. The purpose is to treat any soreness and discomfort from the procedure.  Food intake: Start with clear liquids (like water) and advance to regular food, as tolerated.   Physical activities: Keep activities to a minimum for the first 8 hours after the procedure.   Driving: If you have received any sedation, you are not allowed to drive for 24 hours after your procedure.  Blood thinner: Restart your blood thinner 6 hours after your procedure. (Only for those taking blood thinners)  Insulin: As soon as you can eat, you may resume your normal dosing schedule. (Only for those taking insulin)  Infection prevention: Keep procedure site clean and dry.  Post-procedure Pain Diary: Extremely important that this be done correctly and accurately. Recorded information will be used to determine the next step in treatment.  Pain evaluated is that of treated area only. Do not include pain from an untreated area.  Complete every hour, on the hour, for the initial 8 hours. Set an alarm to help you do this part accurately.  Do not go to sleep and have it completed later. It will not be accurate.  Follow-up appointment: Keep your follow-up appointment after the procedure. Usually 2 weeks for most procedures. (6 weeks in the case of radiofrequency.) Bring you pain diary.   Expect:  From numbing medicine (AKA: Local Anesthetics): Numbness or decrease in pain.  Onset: Full effect within 15  minutes of injected.  Duration: It will depend on the type of local anesthetic used. On the average, 1 to 8 hours.   From steroids: Decrease in swelling or inflammation. Once inflammation is improved, relief of the pain will follow.  Onset of benefits: Depends on the amount of swelling present. The more swelling, the longer it will take for the benefits to be seen. In some cases, up to 10 days.  Duration: Steroids will stay in the system x 2 weeks. Duration of benefits will depend on multiple posibilities including persistent irritating factors.  Occasional side-effects: Facial flushing, cramps (if present, drink Gatorade and take over-the-counter Magnesium 450-500 mg once to twice a day).  From procedure: Some discomfort is to be expected once the numbing medicine wears off. This should be minimal if ice and heat are applied as instructed.  Call if:  You experience numbness and weakness that gets worse with time, as opposed to wearing off.  New onset bowel or bladder incontinence. (This applies to Spinal procedures only)  Emergency Numbers:  Durning business hours (Monday - Thursday, 8:00 AM - 4:00 PM) (Friday, 9:00 AM - 12:00 Noon): [phone]  After hours: [phone] ____________________________________________________________________________________________   ____________________________________________________________________________________________  Pain Scale  Introduction: The pain score used by this practice is the Verbal Numerical Rating Scale (VNRS-11). This is an 11-point scale. It is for adults and children 10 years or older. There are significant differences in how the pain score is reported, used, and applied. Forget everything you learned in the past and learn this scoring system.  General Information: The scale should reflect your current level of pain. Unless you are specifically asked   for the level of your worst pain, or your average pain. If you are asked  for one of these two, then it should be understood that it is over the past 24 hours.  Basic Activities of Daily Living (ADL): Personal hygiene, dressing, eating, transferring, and using restroom.  Instructions: Most patients tend to report their level of pain as a combination of two factors, their physical pain and their psychosocial pain. This last one is also known as "suffering" and it is reflection of how physical pain affects you socially and psychologically. From now on, report them separately. From this point on, when asked to report your pain level, report only your physical pain. Use the following table for reference.  Pain Clinic Pain Levels (0-5/10)  Pain Level Score  Description  No Pain 0   Mild pain 1 Nagging, annoying, but does not interfere with basic activities of daily living (ADL). Patients are able to eat, bathe, get dressed, toileting (being able to get on and off the toilet and perform personal hygiene functions), transfer (move in and out of bed or a chair without assistance), and maintain continence (able to control bladder and bowel functions). Blood pressure and heart rate are unaffected. A normal heart rate for a healthy adult ranges from 60 to 100 bpm (beats per minute).   Mild to moderate pain 2 Noticeable and distracting. Impossible to hide from other people. More frequent flare-ups. Still possible to adapt and function close to normal. It can be very annoying and may have occasional stronger flare-ups. With discipline, patients may get used to it and adapt.   Moderate pain 3 Interferes significantly with activities of daily living (ADL). It becomes difficult to feed, bathe, get dressed, get on and off the toilet or to perform personal hygiene functions. Difficult to get in and out of bed or a chair without assistance. Very distracting. With effort, it can be ignored when deeply involved in activities.   Moderately severe pain 4 Impossible to ignore for more than a few  minutes. With effort, patients may still be able to manage work or participate in some social activities. Very difficult to concentrate. Signs of autonomic nervous system discharge are evident: dilated pupils (mydriasis); mild sweating (diaphoresis); sleep interference. Heart rate becomes elevated (>115 bpm). Diastolic blood pressure (lower number) rises above 100 mmHg. Patients find relief in laying down and not moving.   Severe pain 5 Intense and extremely unpleasant. Associated with frowning face and frequent crying. Pain overwhelms the senses.  Ability to do any activity or maintain social relationships becomes significantly limited. Conversation becomes difficult. Pacing back and forth is common, as getting into a comfortable position is nearly impossible. Pain wakes you up from deep sleep. Physical signs will be obvious: pupillary dilation; increased sweating; goosebumps; brisk reflexes; cold, clammy hands and feet; nausea, vomiting or dry heaves; loss of appetite; significant sleep disturbance with inability to fall asleep or to remain asleep. When persistent, significant weight loss is observed due to the complete loss of appetite and sleep deprivation.  Blood pressure and heart rate becomes significantly elevated. Caution: If elevated blood pressure triggers a pounding headache associated with blurred vision, then the patient should immediately seek attention at an urgent or emergency care unit, as these may be signs of an impending stroke.    Emergency Department Pain Levels (6-10/10)  Emergency Room Pain 6 Severely limiting. Requires emergency care and should not be seen or managed at an outpatient pain management facility. Communication becomes difficult   and requires great effort. Assistance to reach the emergency department may be required. Facial flushing and profuse sweating along with potentially dangerous increases in heart rate and blood pressure will be evident.   Distressing pain 7  Self-care is very difficult. Assistance is required to transport, or use restroom. Assistance to reach the emergency department will be required. Tasks requiring coordination, such as bathing and getting dressed become very difficult.   Disabling pain 8 Self-care is no longer possible. At this level, pain is disabling. The individual is unable to do even the most "basic" activities such as walking, eating, bathing, dressing, transferring to a bed, or toileting. Fine motor skills are lost. It is difficult to think clearly.   Incapacitating pain 9 Pain becomes incapacitating. Thought processing is no longer possible. Difficult to remember your own name. Control of movement and coordination are lost.   The worst pain imaginable 10 At this level, most patients pass out from pain. When this level is reached, collapse of the autonomic nervous system occurs, leading to a sudden drop in blood pressure and heart rate. This in turn results in a temporary and dramatic drop in blood flow to the brain, leading to a loss of consciousness. Fainting is one of the body's self defense mechanisms. Passing out puts the brain in a calmed state and causes it to shut down for a while, in order to begin the healing process.    Summary: 1. Refer to this scale when providing us with your pain level. 2. Be accurate and careful when reporting your pain level. This will help with your care. 3. Over-reporting your pain level will lead to loss of credibility. 4. Even a level of 1/10 means that there is pain and will be treated at our facility. 5. High, inaccurate reporting will be documented as "Symptom Exaggeration", leading to loss of credibility and suspicions of possible secondary gains such as obtaining more narcotics, or wanting to appear disabled, for fraudulent reasons. 6. Only pain levels of 5 or below will be seen at our facility. 7. Pain levels of 6 and above will be sent to the Emergency Department and the appointment  cancelled. ____________________________________________________________________________________________    

## 2017-11-05 NOTE — Progress Notes (Signed)
Safety precautions to be maintained throughout the outpatient stay will include: orient to surroundings, keep bed in low position, maintain call bell within reach at all times, provide assistance with transfer out of bed and ambulation.  

## 2017-11-06 ENCOUNTER — Telehealth: Payer: Self-pay

## 2017-11-06 NOTE — Telephone Encounter (Signed)
Post procedure phone call.   

## 2017-11-10 DIAGNOSIS — Z794 Long term (current) use of insulin: Secondary | ICD-10-CM | POA: Insufficient documentation

## 2017-11-10 DIAGNOSIS — E1165 Type 2 diabetes mellitus with hyperglycemia: Secondary | ICD-10-CM | POA: Insufficient documentation

## 2017-11-19 ENCOUNTER — Other Ambulatory Visit: Payer: Self-pay

## 2017-11-19 ENCOUNTER — Encounter: Payer: Self-pay | Admitting: *Deleted

## 2017-11-19 NOTE — Progress Notes (Signed)
Patient's Name: Charlene [name]  MRN: [id number]  Referring Provider: Tracie Harrier, MD  DOB: 1961-01-12  PCP: Charlene Harrier, MD  DOS: 11/23/2017  Note by: Charlene Cola, MD  Service setting: Ambulatory outpatient  Specialty: Interventional Pain Management  Location: [facility] (AMB) Pain Management Facility    Patient type: Established   Primary Reason(s) for Visit: Encounter for post-procedure evaluation of chronic illness with mild to moderate exacerbation CC: Leg Pain (left thigh, knee, foot) and Foot Pain (left sole burning sensation)  Procedure:          Anesthesia, Analgesia, Anxiolysis:  Type: Diagnostic Intra-Articular Local anesthetic and steroid Knee Injection #1  Region: Lateral infrapatellar Knee Region Level: Knee Joint Laterality: Left knee  Type: Local Anesthesia Indication(s): Analgesia         Local Anesthetic: Lidocaine 1-2% Route: Infiltration ([place]/IM) IV Access: Declined Sedation: Declined   Position: Sitting   Indications: 1. Osteoarthritis of knee (Bilateral)   2. Chronic knee pain (Fourth Area of Pain) (Bilateral) (L>R)    Pain Score: Pre-procedure: 2 /10 Post-procedure: 1 /10  HPI  Charlene [name] is a 57 y.o. year old, female patient, who comes today for a post-procedure evaluation. She has Burning sensation of feet; DM type 2, uncontrolled, with neuropathy ([place]); GERD without esophagitis; Essential (primary) hypertension; Neuropathic pain; Neuropathy; Lower extremity numbness and tingling (Left); Polyneuropathy associated with underlying disease ([place]); Chronic ankle pain (Primary Area of Pain) (Left); Chronic foot pain (Primary Area of Pain) (Left); Chronic lower extremity pain (Secondary Area of Pain) (Bilateral) (L>R); Chronic knee pain (Fourth Area of Pain) (Bilateral) (L>R); Chronic low back pain [facility] Area of Pain) (Bilateral) (L>R) w/ sciatica (Left); Chronic pain syndrome; Opiate use; Pharmacologic therapy; Disorder of skeletal system; Problems  influencing health status; Vitamin D deficiency; DM type 2 with diabetic peripheral neuropathy ([place]); DDD (degenerative disc disease), lumbar; Lumbar facet arthropathy; Lumbar facet syndrome (Bilateral); Lumbar facet hypertrophy; Lumbar foraminal stenosis (L5-S1) (Right); Osteoarthritis of knee (Bilateral); Chronic musculoskeletal pain; Long-term insulin use ([place]); Uncontrolled type 2 diabetes mellitus with hyperglycemia ([place]); and Abnormal EMG (07/06/2017) on their problem list. Her primarily concern today is the Leg Pain (left thigh, knee, foot) and Foot Pain (left sole burning sensation)  Pain Assessment: Location: Left Leg Radiating: foot, thigh, ankle, sole of foot Onset: More than a month ago Duration: Chronic pain Quality: Burning, Nagging, Tingling, Spasm Severity: 2 /10 (subjective, self-reported pain score)  Note: Reported level is compatible with observation.                         When using our objective Pain Scale, levels between 6 and 10/10 are said to belong in an emergency room, as it progressively worsens from a 6/10, described as severely limiting, requiring emergency care not usually available at an outpatient pain management facility. At a 6/10 level, communication becomes difficult and requires great effort. Assistance to reach the emergency department may be required. Facial flushing and profuse sweating along with potentially dangerous increases in heart rate and blood pressure will be evident. Timing: Constant Modifying factors: gabapentin BP: (!) 188/101  HR: 71  Charlene [name] comes in today for post-procedure evaluation.  Further details on both, my assessment(s), as well as the proposed treatment plan, please see below.  Post-Procedure Assessment  11/05/2017 Procedure: Diagnostic left-sided L5 transforaminal epidural steroid injection #1 + diagnostic left-sided L4-5 interlaminar LESI #1 under fluoroscopic guidance and IV sedation Pre-procedure pain score:   4/10 Post-procedure pain score:  0/10 (100% relief) Influential Factors: BMI: 23.23 kg/m Intra-procedural challenges: None observed.         Assessment challenges: None detected.              Reported side-effects: None.        Post-procedural adverse reactions or complications: None reported         Sedation: Sedation provided. When no sedatives are used, the analgesic levels obtained are directly associated to the effectiveness of the local anesthetics. However, when sedation is provided, the level of analgesia obtained during the initial 1 hour following the intervention, is believed to be the result of a combination of factors. These factors may include, but are not limited to: 1. The effectiveness of the local anesthetics used. 2. The effects of the analgesic(s) and/or anxiolytic(s) used. 3. The degree of discomfort experienced by the patient at the time of the procedure. 4. The patients ability and reliability in recalling and recording the events. 5. The presence and influence of possible secondary gains and/or psychosocial factors. Reported result: Relief experienced during the 1st hour after the procedure: 100 % (Ultra-Short Term Relief)            Interpretative annotation: Clinically appropriate result. Analgesia during this period is likely to be Local Anesthetic and/or IV Sedative (Analgesic/Anxiolytic) related.          Effects of local anesthetic: The analgesic effects attained during this period are directly associated to the localized infiltration of local anesthetics and therefore cary significant diagnostic value as to the etiological location, or anatomical origin, of the pain. Expected duration of relief is directly dependent on the pharmacodynamics of the local anesthetic used. Long-acting (4-6 hours) anesthetics used.  Reported result: Relief during the next 4 to 6 hour after the procedure: 90 % (Short-Term Relief)            Interpretative annotation: Clinically  appropriate result. Analgesia during this period is likely to be Local Anesthetic-related.          Long-term benefit: Defined as the period of time past the expected duration of local anesthetics (1 hour for short-acting and 4-6 hours for long-acting). With the possible exception of prolonged sympathetic blockade from the local anesthetics, benefits during this period are typically attributed to, or associated with, other factors such as analgesic sensory neuropraxia, antiinflammatory effects, or beneficial biochemical changes provided by agents other than the local anesthetics.  Reported result: Extended relief following procedure: 75 %(areas improved: calf, thigh, foot) (Long-Term Relief)            Interpretative annotation: Clinically possible results. Good relief. No permanent benefit expected. Inflammation plays a part in the etiology to the pain.          Current benefits: Defined as reported results that persistent at this point in time.   Analgesia: 75 %            Function: Somewhat improved ROM: Somewhat improved Interpretative annotation: Ongoing benefit. No permanent benefit expected. Effective diagnostic intervention.          Interpretation: Results would suggest a successful diagnostic intervention.                  Plan:  Today the patient having more pain in the knee and therefore we will treat this today..                Controlled Substance Pharmacotherapy Assessment REMS (Risk Evaluation and Mitigation Strategy)  Analgesic: Tramadol 50 mg 1 tablet  3 times daily MME/day: 15 mg/day.  Hart Rochester, RN  11/23/2017 10:58 AM  Sign at close encounter Nursing Pain Medication Assessment:  Safety precautions to be maintained throughout the outpatient stay will include: orient to surroundings, keep bed in low position, maintain call bell within reach at all times, provide assistance with transfer out of bed and ambulation.  Medication Inspection Compliance: Pill count  conducted under aseptic conditions, in front of the patient. Neither the pills nor the bottle was removed from the patient's sight at any time. Once count was completed pills were immediately returned to the patient in their original bottle.  Medication: Tramadol (Ultram) Pill/Patch Count: 83 of 90 pills remain Pill/Patch Appearance: Markings consistent with prescribed medication Bottle Appearance: Standard pharmacy container. Clearly labeled. Filled Date: 58 / 01 / 2019 Last Medication intake:  Today   Pharmacokinetics: Liberation and absorption (onset of action): WNL Distribution (time to peak effect): WNL Metabolism and excretion (duration of action): WNL         Pharmacodynamics: Desired effects: Analgesia: Ms. Diver reports >50% benefit. Functional ability: Patient reports that medication allows her to accomplish basic ADLs Clinically meaningful improvement in function (CMIF): Sustained CMIF goals met Perceived effectiveness: Described as relatively effective, allowing for increase in activities of daily living (ADL) Undesirable effects: Side-effects or Adverse reactions: None reported Monitoring: [place] PMP: Online review of the past 71-monthperiod conducted. Compliant with practice rules and regulations Last UDS on record: Summary  Date Value Ref Range Status  10/12/2017 FINAL  Final    Comment:    ==================================================================== [facility]RE COMP DRUG ANALYSIS,UR ==================================================================== Test                             Result       Flag       Units Drug Present and Declared for Prescription Verification   Tramadol                       >>83151      EXPECTED   ng/mg creat   O-Desmethyltramadol            8266         EXPECTED   ng/mg creat   N-Desmethyltramadol            2066         EXPECTED   ng/mg creat    Source of tramadol is a prescription medication.    O-desmethyltramadol and  N-desmethyltramadol are expected    metabolites of tramadol.   Gabapentin                     PRESENT      EXPECTED   Baclofen                       PRESENT      EXPECTED   Metoprolol                     PRESENT      EXPECTED Drug Present not Declared for Prescription Verification   Alcohol, Ethyl                 0.154        UNEXPECTED g/dL    Sources of ethyl alcohol include alcoholic beverages or as a    fermentation product of glucose; glucose is present in this  specimen.  Interpret result with caution, as the presence of    ethyl alcohol is likely due, at least in part, to fermentation of    glucose.   Naproxen                       PRESENT      UNEXPECTED Drug Absent but Declared for Prescription Verification   Tizanidine                     Not Detected UNEXPECTED    Tizanidine, as indicated in the declared medication list, is not    always detected even when used as directed.   Duloxetine                     Not Detected UNEXPECTED   Lidocaine                      Not Detected UNEXPECTED    Lidocaine, as indicated in the declared medication list, is not    always detected even when used as directed. ==================================================================== Test                      Result    Flag   Units      Ref Range   Creatinine              47               mg/dL      >=20 ==================================================================== Declared Medications:  The flagging and interpretation on this report are based on the  following declared medications.  Unexpected results may arise from  inaccuracies in the declared medications.  **Note: The testing scope of this panel includes these medications:  Baclofen  Duloxetine  Gabapentin  Metoprolol  Tramadol  **Note: The testing scope of this panel does not include small to  moderate amounts of these reported medications:  Lidocaine  Tizanidine  **Note: The testing scope of this panel does not include  following  reported medications:  Fluticasone  Hydrochlorothiazide (Lisinopril-HCTZ)  Insulin  Lisinopril (Lisinopril-HCTZ)  Metformin  Omeprazole  Pantoprazole  Potassium ==================================================================== For clinical consultation, please call [phone]. ====================================================================    UDS interpretation: Compliant          Medication Assessment Form: Reviewed. Patient indicates being compliant with therapy Treatment compliance: Compliant Risk Assessment Profile: Aberrant behavior: See prior evaluations. None observed or detected today Comorbid factors increasing risk of overdose: See prior notes. No additional risks detected today Opioid risk tool (ORT) (Total Score): 1 Personal History of Substance Abuse (SUD-Substance use disorder):  Alcohol: Negative  Illegal Drugs: Negative  Rx Drugs: Negative  ORT Risk Level calculation: Low Risk Risk of substance use disorder (SUD): Low Opioid Risk Tool - 11/23/17 1058      Family History of Substance Abuse   Alcohol  Positive Female    Illegal Drugs  Negative    Rx Drugs  Negative      Personal History of Substance Abuse   Alcohol  Negative    Illegal Drugs  Negative    Rx Drugs  Negative      Age   Age between 54-45 years   No      History of Preadolescent Sexual Abuse   History of Preadolescent Sexual Abuse  Negative or Female      Psychological Disease   Psychological Disease  Negative  Depression  Negative      Total Score   Opioid Risk Tool Scoring  1    Opioid Risk Interpretation  Low Risk      ORT Scoring interpretation table:  Score <3 = Low Risk for SUD  Score between 4-7 = Moderate Risk for SUD  Score >8 = High Risk for Opioid Abuse   Risk Mitigation Strategies:  Patient Counseling: Covered Patient-Prescriber Agreement (PPA): Present and active  Notification to other healthcare providers: Done  Pharmacologic Plan: No  change in therapy, at this time.             Laboratory Chemistry  Inflammation Markers (CRP: Acute Phase) (ESR: Chronic Phase) Lab Results  Component Value Date   CRP 4 10/12/2017   ESRSEDRATE 12 10/12/2017                         Rheumatology Markers No results found.  Renal Markers Lab Results  Component Value Date   BUN 10 10/12/2017   CREATININE 0.60 10/12/2017   BCR 17 10/12/2017   GFRAA 119 10/12/2017   GFRNONAA 103 10/12/2017                             Hepatic Markers Lab Results  Component Value Date   AST 16 10/12/2017   ALBUMIN 4.2 10/12/2017                        Neuropathy Markers Lab Results  Component Value Date   VITAMINB12 1,063 10/12/2017                        Hematology Parameters Lab Results  Component Value Date   PLT 300 08/22/2016   HGB 13.7 08/22/2016   HCT 41.3 08/22/2016                        CV Markers No results found.  Note: Lab results reviewed.  Recent Imaging Results   Results for orders placed in visit on 11/05/17  DG C-Arm 1-60 Min-No Report   Narrative Fluoroscopy was utilized by the requesting physician.  No radiographic  interpretation.    Interpretation Report: Fluoroscopy was used during the procedure to assist with needle guidance. The images were interpreted intraoperatively by the requesting physician.  Meds   Current Outpatient Medications:  .  baclofen (LIORESAL) 10 MG tablet, Take 1 tablet (10 mg total) by mouth 3 (three) times daily., Disp: 90 tablet, Rfl: 2 .  Calcium Carbonate-Vit D-Min (GNP CALCIUM 1200) 1200-1000 MG-UNIT CHEW, Chew 1,200 mg by mouth daily with breakfast. Take in combination with vitamin D and magnesium., Disp: 30 tablet, Rfl: 5 .  Cholecalciferol (VITAMIN D3) 5000 units CAPS, Take 1 capsule (5,000 Units total) by mouth daily with breakfast. Take along with calcium and magnesium., Disp: 30 capsule, Rfl: 5 .  DULoxetine (CYMBALTA) 30 MG capsule, Take 30 mg by mouth daily., Disp: ,  Rfl:  .  ergocalciferol (VITAMIN D2) 50000 units capsule, Take 1 capsule (50,000 Units total) by mouth 2 (two) times a week. X 6 weeks., Disp: 12 capsule, Rfl: 0 .  fluticasone (FLONASE) 50 MCG/ACT nasal spray, Place into both nostrils 2 (two) times daily., Disp: , Rfl:  .  gabapentin (NEURONTIN) 800 MG tablet, Take 800 mg by mouth 4 (four) times daily., Disp: , Rfl:  .  insulin aspart (NOVOLOG PENFILL) cartridge, Inject 15 Units into the skin 3 (three) times daily before meals. , Disp: , Rfl:  .  Insulin Glargine (LANTUS SOLOSTAR) 100 UNIT/ML Solostar Pen, Inject 80 Units into the skin daily. At bedtime, Disp: , Rfl:  .  lidocaine (LMX) 4 % cream, Apply 1 application topically as needed., Disp: , Rfl:  .  liraglutide (VICTOZA) 18 MG/3ML SOPN, Inject 1.8 mg into the skin daily., Disp: , Rfl:  .  lisinopril (PRINIVIL,ZESTRIL) 40 MG tablet, Take 40 mg by mouth daily., Disp: , Rfl:  .  Magnesium 500 MG CAPS, Take 1 capsule (500 mg total) by mouth 2 (two) times daily at 8 am and 10 pm., Disp: 60 capsule, Rfl: 5 .  meloxicam (MOBIC) 15 MG tablet, Take 15 mg by mouth daily., Disp: , Rfl:  .  metFORMIN (GLUCOPHAGE) 500 MG tablet, Take 2 tablets by mouth daily. , Disp: , Rfl:  .  metoprolol succinate (TOPROL-XL) 25 MG 24 hr tablet, Take 25 mg by mouth daily., Disp: , Rfl:  .  pantoprazole (PROTONIX) 40 MG tablet, Take 40 mg by mouth 2 (two) times daily., Disp: , Rfl:  .  potassium chloride SA (K-DUR,KLOR-CON) 20 MEQ tablet, Take 1 tablet by mouth daily. As needed, Disp: , Rfl:  .  traMADol (ULTRAM) 50 MG tablet, Take 1 tablet (50 mg total) by mouth every 8 (eight) hours as needed for severe pain., Disp: 90 tablet, Rfl: 2  ROS  Constitutional: Denies any fever or chills Gastrointestinal: No reported hemesis, hematochezia, vomiting, or acute GI distress Musculoskeletal: Denies any acute onset joint swelling, redness, loss of ROM, or weakness Neurological: No reported episodes of acute onset apraxia,  aphasia, dysarthria, agnosia, amnesia, paralysis, loss of coordination, or loss of consciousness  Allergies  Ms. Caskey has No Known Allergies.  [place]  Drug: Ms. Garlock  reports that she does not use drugs. Alcohol:  reports that she drank alcohol. Tobacco:  reports that she has never smoked. She has never used smokeless tobacco. Medical:  has a past medical history of Arthritis, Degenerative disc disease, lumbar, Diabetes mellitus without complication ([place]), GERD (gastroesophageal reflux disease), Hypertension, Neuropathy, Polyneuropathy, and Polyneuropathy. Surgical: Ms. Verner  has a past surgical history that includes arthroscopic rotator cuff; Colonoscopy; Abdominal hysterectomy; and endosopic sinus. Family: family history includes Alcohol abuse in her father; Breast cancer in her maternal aunt; Cancer in her father.  Constitutional Exam  General appearance: Well nourished, well developed, and well hydrated. In no apparent acute distress Vitals:   11/23/17 1048 11/23/17 1225  BP: (!) 182/92 (!) 188/101  Pulse: 83 71  Resp: 18 16  Temp: 98.5 F (36.9 C)   TempSrc: Oral   SpO2: 98% 100%  Weight: 115 lb (52.2 kg)   Height: _0  (1.499 m)    BMI Assessment: Estimated body mass index is 23.23 kg/m as calculated from the following:   Height as of this encounter: _1  (1.499 m).   Weight as of this encounter: 115 lb (52.2 kg).  BMI interpretation table: BMI level Category Range association with higher incidence of chronic pain  <18 kg/m2 Underweight   18.5-24.9 kg/m2 Ideal body weight   25-29.9 kg/m2 Overweight Increased incidence by 20%  30-34.9 kg/m2 Obese (Class I) Increased incidence by 68%  35-39.9 kg/m2 Severe obesity (Class II) Increased incidence by 136%  >40 kg/m2 Extreme obesity (Class III) Increased incidence by 254%   Patient's current BMI Ideal Body weight  Body mass index is  23.23 kg/m. Patient must be at least 60 in tall to calculate ideal body weight    BMI Readings from Last 4 Encounters:  11/23/17 23.23 kg/m  11/05/17 23.83 kg/m  10/21/17 23.23 kg/m  10/12/17 23.23 kg/m   Wt Readings from Last 4 Encounters:  11/23/17 115 lb (52.2 kg)  11/05/17 118 lb (53.5 kg)  10/21/17 115 lb (52.2 kg)  10/12/17 115 lb (52.2 kg)  Psych/Mental status: Alert, oriented x 3 (person, place, & time)       Eyes: PERLA Respiratory: No evidence of acute respiratory distress  Cervical Spine Area Exam  Skin & Axial Inspection: No masses, redness, edema, swelling, or associated skin lesions Alignment: Symmetrical Functional ROM: Unrestricted ROM      Stability: No instability detected Muscle Tone/Strength: Functionally intact. No obvious neuro-muscular anomalies detected. Sensory (Neurological): Unimpaired Palpation: No palpable anomalies              Upper Extremity (UE) Exam    Side: Right upper extremity  Side: Left upper extremity  Skin & Extremity Inspection: Skin color, temperature, and hair growth are WNL. No peripheral edema or cyanosis. No masses, redness, swelling, asymmetry, or associated skin lesions. No contractures.  Skin & Extremity Inspection: Skin color, temperature, and hair growth are WNL. No peripheral edema or cyanosis. No masses, redness, swelling, asymmetry, or associated skin lesions. No contractures.  Functional ROM: Unrestricted ROM          Functional ROM: Unrestricted ROM          Muscle Tone/Strength: Functionally intact. No obvious neuro-muscular anomalies detected.  Muscle Tone/Strength: Functionally intact. No obvious neuro-muscular anomalies detected.  Sensory (Neurological): Unimpaired          Sensory (Neurological): Unimpaired          Palpation: No palpable anomalies              Palpation: No palpable anomalies              Provocative Test(s):  Phalen's test: deferred Tinel's test: deferred Apley's scratch test (touch opposite shoulder):  Action 1 (Across chest): deferred Action 2 (Overhead): deferred Action  3 (LB reach): deferred   Provocative Test(s):  Phalen's test: deferred Tinel's test: deferred Apley's scratch test (touch opposite shoulder):  Action 1 (Across chest): deferred Action 2 (Overhead): deferred Action 3 (LB reach): deferred    Thoracic Spine Area Exam  Skin & Axial Inspection: No masses, redness, or swelling Alignment: Symmetrical Functional ROM: Unrestricted ROM Stability: No instability detected Muscle Tone/Strength: Functionally intact. No obvious neuro-muscular anomalies detected. Sensory (Neurological): Unimpaired Muscle strength & Tone: No palpable anomalies  Lumbar Spine Area Exam  Skin & Axial Inspection: No masses, redness, or swelling Alignment: Symmetrical Functional ROM: Unrestricted ROM       Stability: No instability detected Muscle Tone/Strength: Functionally intact. No obvious neuro-muscular anomalies detected. Sensory (Neurological): Unimpaired Palpation: No palpable anomalies       Provocative Tests: Hyperextension/rotation test: deferred today       Lumbar quadrant test (Kemp's test): deferred today       Lateral bending test: deferred today       Patrick's Maneuver: deferred today                   FABER test: deferred today                   S-I anterior distraction/compression test: deferred today         S-I lateral compression test:  deferred today         S-I Thigh-thrust test: deferred today         S-I Gaenslen's test: deferred today          Gait & Posture Assessment  Ambulation: Unassisted Gait: Relatively normal for age and body habitus Posture: WNL   Lower Extremity Exam    Side: Right lower extremity  Side: Left lower extremity  Stability: No instability observed          Stability: No instability observed          Skin & Extremity Inspection: Skin color, temperature, and hair growth are WNL. No peripheral edema or cyanosis. No masses, redness, swelling, asymmetry, or associated skin lesions. No contractures.  Skin & Extremity  Inspection: Skin color, temperature, and hair growth are WNL. No peripheral edema or cyanosis. No masses, redness, swelling, asymmetry, or associated skin lesions. No contractures.  Functional ROM: Unrestricted ROM                  Functional ROM: Unrestricted ROM                  Muscle Tone/Strength: Functionally intact. No obvious neuro-muscular anomalies detected.  Muscle Tone/Strength: Functionally intact. No obvious neuro-muscular anomalies detected.  Sensory (Neurological): Unimpaired  Sensory (Neurological): Unimpaired  Palpation: No palpable anomalies  Palpation: No palpable anomalies   Assessment  Primary Diagnosis & Pertinent Problem List: The primary encounter diagnosis was Chronic low back pain (Tertiary Area of Pain) (Bilateral) (L>R) w/ sciatica (Left). Diagnoses of Chronic knee pain (Fourth Area of Pain) (Bilateral) (L>R), Osteoarthritis of knee (Bilateral), Abnormal EMG (07/06/2017), Chronic musculoskeletal pain, and Chronic pain syndrome were also pertinent to this visit.  Status Diagnosis  Controlled Controlled Controlled 1. 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 Chronic knee pain (Fourth Area of Pain) (Bilateral) (L>R)   3. Osteoarthritis of knee (Bilateral)   4. Abnormal EMG (07/06/2017)   5. Chronic musculoskeletal pain   6. Chronic pain syndrome     Problems updated and reviewed during this visit: Problem  Abnormal EMG (07/06/2017)   07/06/17 EMG IMPRESSION: Abnormal study. 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    Uncontrolled Type 2 Diabetes Mellitus With Hyperglycemia (Hcc)  DM Type 2 With Diabetic Peripheral Neuropathy (Hcc)  Long-Term Insulin Use (Hcc)  Essential (Primary) Hypertension    Pre-op Assessment:  Ms. Lorenz is a 57 y.o. (year old), female patient, seen today for interventional  treatment. She  has a past surgical history that includes arthroscopic rotator cuff; Colonoscopy; Abdominal hysterectomy; and endosopic sinus. Ms. Lennox has a current medication list which includes the following prescription(s): baclofen, gnp calcium 1200, vitamin d3, duloxetine, ergocalciferol, fluticasone, gabapentin, insulin aspart, insulin glargine, lidocaine, liraglutide, lisinopril, magnesium, meloxicam, metformin, metoprolol succinate, pantoprazole, potassium chloride sa, and tramadol. Her primarily concern today is the Leg Pain (left thigh, knee, foot) and Foot Pain (left sole burning sensation)  Initial Vital Signs:  Pulse/HCG Rate: 83  Temp: 98.5 F (36.9 C) Resp: 18 BP: (!) 182/92 SpO2: 98 %  BMI: Estimated body mass index is 23.23 kg/m as calculated from the following:   Height as of this encounter: '4\' 11"'$  (1.499 m).   Weight as of this encounter: 115 lb (52.2 kg).  Risk Assessment: Allergies: Reviewed. She has No Known Allergies.  Allergy Precautions: None required Coagulopathies: Reviewed.  None identified.  Blood-thinner therapy: None at this time Active Infection(s): Reviewed. None identified. Ms. Thackston is afebrile  Site Confirmation: Ms. Muffley was asked to confirm the procedure and laterality before marking the site Procedure checklist: Completed Consent: Before the procedure and under the influence of no sedative(s), amnesic(s), or anxiolytics, the patient was informed of the treatment options, risks and possible complications. To fulfill our ethical and legal obligations, as recommended by the American Medical Association's Code of Ethics, I have informed the patient of my clinical impression; the nature and purpose of the treatment or procedure; the risks, benefits, and possible complications of the intervention; the alternatives, including doing nothing; the risk(s) and benefit(s) of the alternative treatment(s) or procedure(s); and the risk(s) and benefit(s) of doing  nothing. The patient was provided information about the general risks and possible complications associated with the procedure. These may include, but are not limited to: failure to achieve desired goals, infection, bleeding, organ or nerve damage, allergic reactions, paralysis, and death. In addition, the patient was informed of those risks and complications associated to the procedure, such as failure to decrease pain; infection; bleeding; organ or nerve damage with subsequent damage to sensory, motor, and/or autonomic systems, resulting in permanent pain, numbness, and/or weakness of one or several areas of the body; allergic reactions; (i.e.: anaphylactic reaction); and/or death. Furthermore, the patient was informed of those risks and complications associated with the medications. These include, but are not limited to: allergic reactions (i.e.: anaphylactic or anaphylactoid reaction(s)); adrenal axis suppression; blood sugar elevation that in diabetics may result in ketoacidosis or comma; water retention that in patients with history of congestive heart failure may result in shortness of breath, pulmonary edema, and decompensation with resultant heart failure; weight gain; swelling or edema; medication-induced neural toxicity; particulate matter embolism and blood vessel occlusion with resultant organ, and/or nervous system infarction; and/or aseptic necrosis of one or more joints. Finally, the patient was informed that Medicine is not an exact science; therefore, there is also the possibility of unforeseen or unpredictable risks and/or possible complications that may result in a catastrophic outcome. The patient indicated having understood very clearly. We have given the patient no guarantees and we have made no promises. Enough time was given to the patient to ask questions, all of which were answered to the patient's satisfaction. Ms. Marando has indicated that she wanted to continue with the  procedure. Attestation: I, the ordering provider, attest that I have discussed with the patient the benefits, risks, side-effects, alternatives, likelihood of achieving goals, and potential problems during recovery for the procedure that I have provided informed consent. Date  Time: 11/23/2017 10:49 AM  Pre-Procedure Preparation:  Monitoring: As per clinic protocol. Respiration, ETCO2, SpO2, BP, heart rate and rhythm monitor placed and checked for adequate function Safety Precautions: Patient was assessed for positional comfort and pressure points before starting the procedure. Time-out: I initiated and conducted the "Time-out" before starting the procedure, as per protocol. The patient was asked to participate by confirming the accuracy of the "Time Out" information. Verification of the correct person, site, and procedure were performed and confirmed by me, the nursing staff, and the patient. "Time-out" conducted as per Joint Commission's Universal Protocol (UP.01.01.01). Time: 1220  Description of Procedure:          Target Area: Knee Joint Approach: Just above the Lateral tibial plateau, lateral to the infrapatellar tendon. Area Prepped: Entire knee area, from the mid-thigh to the mid-shin. Prepping solution: ChloraPrep (2% chlorhexidine gluconate  and 70% isopropyl alcohol) Safety Precautions: Aspiration looking for blood return was conducted prior to all injections. At no point did we inject any substances, as a needle was being advanced. No attempts were made at seeking any paresthesias. Safe injection practices and needle disposal techniques used. Medications properly checked for expiration dates. SDV (single dose vial) medications used. Description of the Procedure: Protocol guidelines were followed. The patient was placed in position over the fluoroscopy table. The target area was identified and the area prepped in the usual manner. Skin & deeper tissues infiltrated with local anesthetic.  Appropriate amount of time allowed to pass for local anesthetics to take effect. The procedure needles were then advanced to the target area. Proper needle placement secured. Negative aspiration confirmed. Solution injected in intermittent fashion, asking for systemic symptoms every 0.5cc of injectate. The needles were then removed and the area cleansed, making sure to leave some of the prepping solution back to take advantage of its long term bactericidal properties. Vitals:   11/23/17 1048 11/23/17 1225  BP: (!) 182/92 (!) 188/101  Pulse: 83 71  Resp: 18 16  Temp: 98.5 F (36.9 C)   TempSrc: Oral   SpO2: 98% 100%  Weight: 115 lb (52.2 kg)   Height: _0  (1.499 m)     Start Time: 1220 hrs. End Time: 1221 hrs. Materials:  Needle(s) Type: Regular needle Gauge: 25G Length: 1.5-in Medication(s): Please see orders for medications and dosing details.  Imaging Guidance:          Type of Imaging Technique: None used Indication(s): N/A Exposure Time: No patient exposure Contrast: None used. Fluoroscopic Guidance: N/A Ultrasound Guidance: N/A Interpretation: N/A  Antibiotic Prophylaxis:   Anti-infectives (From admission, onward)   None     Indication(s): None identified  Post-operative Assessment:  Post-procedure Vital Signs:  Pulse/HCG Rate: 71  Temp: 98.5 F (36.9 C) Resp: 16 BP: (!) 188/101 SpO2: 100 %  EBL: None  Complications: No immediate post-treatment complications observed by team, or reported by patient.  Note: The patient tolerated the entire procedure well. A repeat set of vitals were taken after the procedure and the patient was kept under observation following institutional policy, for this type of procedure. Post-procedural neurological assessment was performed, showing return to baseline, prior to discharge. The patient was provided with post-procedure discharge instructions, including a section on how to identify potential problems. Should any problems  arise concerning this procedure, the patient was given instructions to immediately contact us, at any time, without hesitation. In any case, we plan to contact the patient by telephone for a follow-up status report regarding this interventional procedure.  Comments:  No additional relevant information.  Plan of Care  Pharmacotherapy (Medications Ordered): Meds ordered this encounter  Medications  . lidocaine (PF) (XYLOCAINE) 1 % injection 5 mL  . methylPREDNISolone acetate (DEPO-MEDROL) injection 80 mg  . ropivacaine (PF) 2 mg/mL (0.2%) (NAROPIN) injection 2 mL  . baclofen (LIORESAL) 10 MG tablet    Sig: Take 1 tablet (10 mg total) by mouth 3 (three) times daily.    Dispense:  90 tablet    Refill:  2    Do not place medication on "Automatic Refill". Fill one day early if pharmacy is closed on scheduled refill date.  . traMADol (ULTRAM) 50 MG tablet    Sig: Take 1 tablet (50 mg total) by mouth every 8 (eight) hours as needed for severe pain.    Dispense:  90 tablet    Refill:  2  [place] STOP ACT - Not applicable. Fill one day early if pharmacy is closed on scheduled refill date. Do not fill until: 11/23/17 . Must last 30 days. To last until: 02/21/18.   Medications administered today: We administered lidocaine (PF), methylPREDNISolone acetate, and ropivacaine (PF) 2 mg/mL (0.2%).  Procedure Orders     Left KNEE INJECTION, today. Lab Orders  No laboratory test(s) ordered today   Imaging Orders  No imaging studies ordered today   Referral Orders  No referral(s) requested today   Interventional management options: Planned, scheduled, and/or pending:   Diagnostic left intra-articular knee joint injection #1 today (local anesthetic and steroid)  NEXT considering: Diagnostic bilateral vs left-sided lumbar sympathetic block #1 under fluoroscopic guidance and IV sedation  Diagnostic left-sided L5 transforaminal ESI #2 + diagnostic left-sided L4-5 interlaminar LESI #2 under fluoroscopic  guidance and IV sedation   Considering:   Diagnostic bilateral vs left-sided lumbar sympathetic block  Possible bilateral vs left-sided lumbar sympathetic RFA  Diagnostic left-sided L4-5 interlaminar LESI  Diagnostic right-sided L5 transforaminal ESI  Diagnostic bilateral lumbar facet block  Possible bilateral lumbar facet RFA  Diagnostic bilateral intra-articular knee joint injection with local anesthetic and steroid  Possible series of 5 bilateral intra-articular Hyalgan knee injections  Diagnostic bilateral genicular nerve block  Possible bilateral genicular nerve RFA    Palliative PRN treatment(s):   None at this time   Provider-requested follow-up: Return for post-procedure eval (2 wks), w/ Dr. Dossie Arbour.  Future Appointments  Date Time Provider [place]  12/07/2017  8:30 AM Milinda Pointer, MD [facility] None  02/15/2018  8:30 AM Vevelyn Francois, NP [facility] None   Primary Care Physician: Charlene Harrier, MD Location: [facility] Note by: Charlene Cola, MD Date: 11/23/2017; Time: 12:40 PM   Disclaimer:  Medicine is not an Chief Strategy Officer. The only guarantee in medicine is that nothing is guaranteed. It is important to note that the decision to proceed with this intervention was based on the information collected from the patient. The Data and conclusions were drawn from the patient's questionnaire, the interview, and the physical examination. Because the information was provided in large part by the patient, it cannot be guaranteed that it has not been purposely or unconsciously manipulated. Every effort has been made to obtain as much relevant data as possible for this evaluation. It is important to note that the conclusions that lead to this procedure are derived in large part from the available data. Always take into account that the treatment will also be dependent on availability of resources and existing treatment guidelines,  considered by other Pain Management Practitioners as being common knowledge and practice, at the time of the intervention. For Medico-Legal purposes, it is also important to point out that variation in procedural techniques and pharmacological choices are the acceptable norm. The indications, contraindications, technique, and results of the above procedure should only be interpreted and judged by a Board-Certified Interventional Pain Specialist with extensive familiarity and expertise in the same exact procedure and technique.

## 2017-11-19 NOTE — Discharge Instructions (Signed)

## 2017-11-23 ENCOUNTER — Encounter: Payer: Self-pay | Admitting: Pain Medicine

## 2017-11-23 ENCOUNTER — Other Ambulatory Visit: Payer: Self-pay

## 2017-11-23 ENCOUNTER — Ambulatory Visit: Payer: BLUE CROSS/BLUE SHIELD | Attending: Pain Medicine | Admitting: Pain Medicine

## 2017-11-23 VITALS — BP 188/101 | HR 71 | Temp 98.5°F | Resp 16 | Ht 59.0 in | Wt 115.0 lb

## 2017-11-23 DIAGNOSIS — R208 Other disturbances of skin sensation: Secondary | ICD-10-CM | POA: Diagnosis not present

## 2017-11-23 DIAGNOSIS — M5442 Lumbago with sciatica, left side: Secondary | ICD-10-CM | POA: Insufficient documentation

## 2017-11-23 DIAGNOSIS — Z79891 Long term (current) use of opiate analgesic: Secondary | ICD-10-CM | POA: Insufficient documentation

## 2017-11-23 DIAGNOSIS — M25562 Pain in left knee: Secondary | ICD-10-CM

## 2017-11-23 DIAGNOSIS — M47816 Spondylosis without myelopathy or radiculopathy, lumbar region: Secondary | ICD-10-CM | POA: Diagnosis not present

## 2017-11-23 DIAGNOSIS — M5136 Other intervertebral disc degeneration, lumbar region: Secondary | ICD-10-CM | POA: Diagnosis not present

## 2017-11-23 DIAGNOSIS — E1140 Type 2 diabetes mellitus with diabetic neuropathy, unspecified: Secondary | ICD-10-CM | POA: Insufficient documentation

## 2017-11-23 DIAGNOSIS — M25561 Pain in right knee: Secondary | ICD-10-CM

## 2017-11-23 DIAGNOSIS — K219 Gastro-esophageal reflux disease without esophagitis: Secondary | ICD-10-CM | POA: Diagnosis not present

## 2017-11-23 DIAGNOSIS — E559 Vitamin D deficiency, unspecified: Secondary | ICD-10-CM | POA: Diagnosis not present

## 2017-11-23 DIAGNOSIS — G894 Chronic pain syndrome: Secondary | ICD-10-CM | POA: Diagnosis not present

## 2017-11-23 DIAGNOSIS — M170 Bilateral primary osteoarthritis of knee: Secondary | ICD-10-CM | POA: Insufficient documentation

## 2017-11-23 DIAGNOSIS — M79605 Pain in left leg: Secondary | ICD-10-CM | POA: Insufficient documentation

## 2017-11-23 DIAGNOSIS — Z794 Long term (current) use of insulin: Secondary | ICD-10-CM | POA: Insufficient documentation

## 2017-11-23 DIAGNOSIS — G8929 Other chronic pain: Secondary | ICD-10-CM

## 2017-11-23 DIAGNOSIS — R94131 Abnormal electromyogram [EMG]: Secondary | ICD-10-CM | POA: Diagnosis not present

## 2017-11-23 DIAGNOSIS — M7918 Myalgia, other site: Secondary | ICD-10-CM

## 2017-11-23 MED ORDER — ROPIVACAINE HCL 2 MG/ML IJ SOLN
2.0000 mL | Freq: Once | INTRAMUSCULAR | Status: AC
  Administered 2017-11-23: 2 mL via INTRA_ARTICULAR
  Filled 2017-11-23: qty 10

## 2017-11-23 MED ORDER — BACLOFEN 10 MG PO TABS: 10.0000 mg | ORAL_TABLET | Freq: Three times a day (TID) | ORAL | 2 refills | Status: DC

## 2017-11-23 MED ORDER — LIDOCAINE HCL (PF) 1 % IJ SOLN
5.0000 mL | Freq: Once | INTRAMUSCULAR | Status: AC
  Administered 2017-11-23: 5 mL
  Filled 2017-11-23: qty 5

## 2017-11-23 MED ORDER — METHYLPREDNISOLONE ACETATE 80 MG/ML IJ SUSP
80.0000 mg | Freq: Once | INTRAMUSCULAR | Status: AC
  Administered 2017-11-23: 80 mg via INTRA_ARTICULAR
  Filled 2017-11-23: qty 1

## 2017-11-23 MED ORDER — TRAMADOL HCL 50 MG PO TABS: 50.0000 mg | ORAL_TABLET | Freq: Three times a day (TID) | ORAL | 2 refills | Status: DC | PRN

## 2017-11-23 NOTE — Progress Notes (Signed)
Nursing Pain Medication Assessment:  Safety precautions to be maintained throughout the outpatient stay will include: orient to surroundings, keep bed in low position, maintain call bell within reach at all times, provide assistance with transfer out of bed and ambulation.  Medication Inspection Compliance: Pill count conducted under aseptic conditions, in front of the patient. Neither the pills nor the bottle was removed from the patient's sight at any time. Once count was completed pills were immediately returned to the patient in their original bottle.  Medication: Tramadol (Ultram) Pill/Patch Count: 83 of 90 pills remain Pill/Patch Appearance: Markings consistent with prescribed medication Bottle Appearance: Standard pharmacy container. Clearly labeled. Filled Date: 35 / 01 / 2019 Last Medication intake:  Today

## 2017-11-23 NOTE — Patient Instructions (Addendum)
____________________________________________________________________________________________  Post-Procedure Discharge Instructions  Instructions:  Apply ice: Fill a plastic sandwich bag with crushed ice. Cover it with a small towel and apply to injection site. Apply for 15 minutes then remove x 15 minutes. Repeat sequence on day of procedure, until you go to bed. The purpose is to minimize swelling and discomfort after procedure.  Apply heat: Apply heat to procedure site starting the day following the procedure. The purpose is to treat any soreness and discomfort from the procedure.  Food intake: Start with clear liquids (like water) and advance to regular food, as tolerated.   Physical activities: Keep activities to a minimum for the first 8 hours after the procedure.   Driving: If you have received any sedation, you are not allowed to drive for 24 hours after your procedure.  Blood thinner: Restart your blood thinner 6 hours after your procedure. (Only for those taking blood thinners)  Insulin: As soon as you can eat, you may resume your normal dosing schedule. (Only for those taking insulin)  Infection prevention: Keep procedure site clean and dry.  Post-procedure Pain Diary: Extremely important that this be done correctly and accurately. Recorded information will be used to determine the next step in treatment.  Pain evaluated is that of treated area only. Do not include pain from an untreated area.  Complete every hour, on the hour, for the initial 8 hours. Set an alarm to help you do this part accurately.  Do not go to sleep and have it completed later. It will not be accurate.  Follow-up appointment: Keep your follow-up appointment after the procedure. Usually 2 weeks for most procedures. (6 weeks in the case of radiofrequency.) Bring you pain diary.   Expect:  From numbing medicine (AKA: Local Anesthetics): Numbness or decrease in pain.  Onset: Full effect within 15  minutes of injected.  Duration: It will depend on the type of local anesthetic used. On the average, 1 to 8 hours.   From steroids: Decrease in swelling or inflammation. Once inflammation is improved, relief of the pain will follow.  Onset of benefits: Depends on the amount of swelling present. The more swelling, the longer it will take for the benefits to be seen. In some cases, up to 10 days.  Duration: Steroids will stay in the system x 2 weeks. Duration of benefits will depend on multiple posibilities including persistent irritating factors.  Occasional side-effects: Facial flushing, cramps (if present, drink Gatorade and take over-the-counter Magnesium 450-500 mg once to twice a day).  From procedure: Some discomfort is to be expected once the numbing medicine wears off. This should be minimal if ice and heat are applied as instructed.  Call if:  You experience numbness and weakness that gets worse with time, as opposed to wearing off.  New onset bowel or bladder incontinence. (This applies to Spinal procedures only)  Emergency Numbers:  Durning business hours (Monday - Thursday, 8:00 AM - 4:00 PM) (Friday, 9:00 AM - 12:00 Noon): [phone]  After hours: [phone] ____________________________________________________________________________________________   Pain Management Discharge Instructions  General Discharge Instructions :  If you need to reach your doctor call: Monday-Friday 8:00 am - 4:00 pm at [phone] or toll free [phone].  After clinic hours [phone] to have operator reach doctor.  Bring all of your medication bottles to all your appointments in the pain clinic.  To cancel or reschedule your appointment with Pain Management please remember to call 24 hours in advance to avoid a fee.  Refer to the educational materials which you have been given on: General Risks, I had my Procedure. Discharge Instructions, Post Sedation.  Post Procedure  Instructions:  The drugs you were given will stay in your system until tomorrow, so for the next 24 hours you should not drive, make any legal decisions or drink any alcoholic beverages.  You may eat anything you prefer, but it is better to start with liquids then soups and crackers, and gradually work up to solid foods.  Please notify your doctor immediately if you have any unusual bleeding, trouble breathing or pain that is not related to your normal pain.  Depending on the type of procedure that was done, some parts of your body may feel week and/or numb.  This usually clears up by tonight or the next day.  Walk with the use of an assistive device or accompanied by an adult for the 24 hours.  You may use ice on the affected area for the first 24 hours.  Put ice in a Ziploc bag and cover with a towel and place against area 15 minutes on 15 minutes off.  You may switch to heat after 24 hours.

## 2017-11-25 ENCOUNTER — Ambulatory Visit: Payer: BLUE CROSS/BLUE SHIELD | Admitting: Anesthesiology

## 2017-11-25 ENCOUNTER — Encounter
Admission: RE | Disposition: A | Discharge status: Home / Self Care | Payer: Self-pay | Source: Ambulatory Visit | Attending: Ophthalmology

## 2017-11-25 ENCOUNTER — Ambulatory Visit
Admission: RE | Admit: 2017-11-25 | Discharge: 2017-11-25 | Disposition: A | Discharge status: Home / Self Care | Payer: BLUE CROSS/BLUE SHIELD | Source: Ambulatory Visit | Attending: Ophthalmology | Admitting: Ophthalmology

## 2017-11-25 DIAGNOSIS — Z791 Long term (current) use of non-steroidal anti-inflammatories (NSAID): Secondary | ICD-10-CM | POA: Insufficient documentation

## 2017-11-25 DIAGNOSIS — F329 Major depressive disorder, single episode, unspecified: Secondary | ICD-10-CM | POA: Insufficient documentation

## 2017-11-25 DIAGNOSIS — I10 Essential (primary) hypertension: Secondary | ICD-10-CM | POA: Insufficient documentation

## 2017-11-25 DIAGNOSIS — H2511 Age-related nuclear cataract, right eye: Secondary | ICD-10-CM | POA: Insufficient documentation

## 2017-11-25 DIAGNOSIS — Z794 Long term (current) use of insulin: Secondary | ICD-10-CM | POA: Diagnosis not present

## 2017-11-25 DIAGNOSIS — Z79899 Other long term (current) drug therapy: Secondary | ICD-10-CM | POA: Diagnosis not present

## 2017-11-25 DIAGNOSIS — E1140 Type 2 diabetes mellitus with diabetic neuropathy, unspecified: Secondary | ICD-10-CM | POA: Insufficient documentation

## 2017-11-25 DIAGNOSIS — K219 Gastro-esophageal reflux disease without esophagitis: Secondary | ICD-10-CM | POA: Diagnosis not present

## 2017-11-25 HISTORY — DX: Other intervertebral disc degeneration, lumbar region: M51.36

## 2017-11-25 HISTORY — DX: Other intervertebral disc degeneration, lumbar region without mention of lumbar back pain or lower extremity pain: M51.369

## 2017-11-25 HISTORY — DX: Unspecified osteoarthritis, unspecified site: M19.90

## 2017-11-25 HISTORY — PX: CATARACT EXTRACTION W/PHACO: SHX586

## 2017-11-25 LAB — GLUCOSE, CAPILLARY
GLUCOSE-CAPILLARY: 229 mg/dL — AB (ref 70–99)
GLUCOSE-CAPILLARY: 229 mg/dL — AB (ref 70–99)

## 2017-11-25 SURGERY — PHACOEMULSIFICATION, CATARACT, WITH IOL INSERTION
Anesthesia: Monitor Anesthesia Care | Site: Eye | Laterality: Right

## 2017-11-25 MED ORDER — MIDAZOLAM HCL 2 MG/2ML IJ SOLN
INTRAMUSCULAR | Status: DC | PRN
  Administered 2017-11-25: 2 mg via INTRAVENOUS

## 2017-11-25 MED ORDER — NA HYALUR & NA CHOND-NA HYALUR 0.4-0.35 ML IO KIT
PACK | INTRAOCULAR | Status: DC | PRN
  Administered 2017-11-25: 1 mL via INTRAOCULAR

## 2017-11-25 MED ORDER — BRIMONIDINE TARTRATE-TIMOLOL 0.2-0.5 % OP SOLN
OPHTHALMIC | Status: DC | PRN
  Administered 2017-11-25: 1 [drp] via OPHTHALMIC

## 2017-11-25 MED ORDER — LACTATED RINGERS IV SOLN: INTRAVENOUS | Status: DC

## 2017-11-25 MED ORDER — ARMC OPHTHALMIC DILATING DROPS
1.0000 "application " | OPHTHALMIC | Status: DC | PRN
  Administered 2017-11-25 (×3): 1 via OPHTHALMIC

## 2017-11-25 MED ORDER — ACETAMINOPHEN 160 MG/5ML PO SOLN: 325.0000 mg | ORAL | Status: DC | PRN

## 2017-11-25 MED ORDER — EPINEPHRINE PF 1 MG/ML IJ SOLN
INTRAOCULAR | Status: DC | PRN
  Administered 2017-11-25: 75 mL via OPHTHALMIC

## 2017-11-25 MED ORDER — CEFUROXIME OPHTHALMIC INJECTION 1 MG/0.1 ML
INJECTION | OPHTHALMIC | Status: DC | PRN
  Administered 2017-11-25: 0.1 mL via INTRACAMERAL

## 2017-11-25 MED ORDER — MOXIFLOXACIN HCL 0.5 % OP SOLN
1.0000 [drp] | OPHTHALMIC | Status: DC | PRN
  Administered 2017-11-25 (×3): 1 [drp] via OPHTHALMIC

## 2017-11-25 MED ORDER — FENTANYL CITRATE (PF) 100 MCG/2ML IJ SOLN
INTRAMUSCULAR | Status: DC | PRN
  Administered 2017-11-25 (×2): 25 ug via INTRAVENOUS

## 2017-11-25 MED ORDER — ACETAMINOPHEN 325 MG PO TABS: 650.0000 mg | ORAL_TABLET | Freq: Once | ORAL | Status: DC | PRN

## 2017-11-25 MED ORDER — ONDANSETRON HCL 4 MG/2ML IJ SOLN: 4.0000 mg | Freq: Once | INTRAMUSCULAR | Status: DC | PRN

## 2017-11-25 MED ORDER — LIDOCAINE HCL (PF) 2 % IJ SOLN
INTRAOCULAR | Status: DC | PRN
  Administered 2017-11-25: 2 mL

## 2017-11-25 MED ORDER — TETRACAINE HCL 0.5 % OP SOLN
1.0000 [drp] | OPHTHALMIC | Status: DC | PRN
  Administered 2017-11-25 (×2): 1 [drp] via OPHTHALMIC

## 2017-11-25 SURGICAL SUPPLY — 20 items
CANNULA ANT/CHMB 27G (MISCELLANEOUS) ×1 IMPLANT
CANNULA ANT/CHMB 27GA (MISCELLANEOUS) ×3 IMPLANT
GLOVE SURG LX 7.5 STRW (GLOVE) ×2
GLOVE SURG LX STRL 7.5 STRW (GLOVE) ×1 IMPLANT
GLOVE SURG TRIUMPH 8.0 PF LTX (GLOVE) ×3 IMPLANT
GOWN STRL REUS W/ TWL LRG LVL3 (GOWN DISPOSABLE) ×2 IMPLANT
GOWN STRL REUS W/TWL LRG LVL3 (GOWN DISPOSABLE) ×4
LENS IOL TECNIS ITEC 20.0 (Intraocular Lens) ×2 IMPLANT
MARKER SKIN DUAL TIP RULER LAB (MISCELLANEOUS) ×3 IMPLANT
NDL FILTER BLUNT 18X1 1/2 (NEEDLE) ×1 IMPLANT
NEEDLE FILTER BLUNT 18X 1/2SAF (NEEDLE) ×2
NEEDLE FILTER BLUNT 18X1 1/2 (NEEDLE) ×1 IMPLANT
PACK CATARACT BRASINGTON (MISCELLANEOUS) ×3 IMPLANT
PACK EYE AFTER SURG (MISCELLANEOUS) ×3 IMPLANT
PACK OPTHALMIC (MISCELLANEOUS) ×3 IMPLANT
SYR 3ML LL SCALE MARK (SYRINGE) ×3 IMPLANT
SYR 5ML LL (SYRINGE) ×3 IMPLANT
SYR TB 1ML LUER SLIP (SYRINGE) ×3 IMPLANT
WATER STERILE IRR 500ML POUR (IV SOLUTION) ×3 IMPLANT
WIPE NON LINTING 3.25X3.25 (MISCELLANEOUS) ×3 IMPLANT

## 2017-11-25 NOTE — Anesthesia Procedure Notes (Signed)
Procedure Name: MAC Date/Time: 11/25/2017 8:09 AM Performed by: Jeannene Patella, CRNA Pre-anesthesia Checklist: Patient identified, Emergency Drugs available, Suction available, Timeout performed and Patient being monitored Patient Re-evaluated:Patient Re-evaluated prior to induction Oxygen Delivery Method: Nasal cannula Induction Type: IV induction

## 2017-11-25 NOTE — Anesthesia Postprocedure Evaluation (Signed)
Anesthesia Post Note  Patient: Charlene [name]  Procedure(s) Performed: CATARACT EXTRACTION PHACO AND INTRAOCULAR LENS PLACEMENT (IOC) RIGHT DIABETIC (Right Eye)  Patient location during evaluation: PACU Anesthesia Type: MAC Level of consciousness: awake and alert, oriented and patient cooperative Pain management: pain level controlled Vital Signs Assessment: post-procedure vital signs reviewed and stable Respiratory status: spontaneous breathing, nonlabored ventilation and respiratory function stable Cardiovascular status: blood pressure returned to baseline and stable Postop Assessment: adequate PO intake Anesthetic complications: no    Darrin Nipper

## 2017-11-25 NOTE — Anesthesia Postprocedure Evaluation (Signed)
Anesthesia Post Note  Patient: Charlene [name]  Procedure(s) Performed: CATARACT EXTRACTION PHACO AND INTRAOCULAR LENS PLACEMENT (IOC) RIGHT DIABETIC (Right Eye)  Anesthesia Type: MAC  report to rn  [employer]

## 2017-11-25 NOTE — Anesthesia Preprocedure Evaluation (Signed)
Anesthesia Evaluation  Patient identified by MRN, date of birth, ID band Patient awake    Reviewed: Allergy & Precautions, NPO status , Patient's Chart, lab work & pertinent test results  History of Anesthesia Complications Negative for: history of anesthetic complications  Airway Mallampati: II  TM Distance: >3 FB Neck ROM: Full    Dental  (+)    Pulmonary neg pulmonary ROS,    Pulmonary exam normal breath sounds clear to auscultation       Cardiovascular Exercise Tolerance: Good hypertension, Normal cardiovascular exam Rhythm:Regular Rate:Normal     Neuro/Psych  Neuromuscular disease (diabetic neuropathy)    GI/Hepatic GERD  ,  Endo/Other  diabetes, Poorly Controlled, Type 2, Insulin Dependent  Renal/GU negative Renal ROS     Musculoskeletal  (+) Arthritis , Osteoarthritis,    Abdominal   Peds  Hematology negative hematology ROS (+)   Anesthesia Other Findings   Reproductive/Obstetrics                             Anesthesia Physical Anesthesia Plan  ASA: III  Anesthesia Plan: MAC   Post-op Pain Management:    Induction: Intravenous  PONV Risk Score and Plan: 2 and TIVA and Midazolam  Airway Management Planned: Natural Airway  Additional Equipment:   Intra-op Plan:   Post-operative Plan:   Informed Consent: I have reviewed the patients History and Physical, chart, labs and discussed the procedure including the risks, benefits and alternatives for the proposed anesthesia with the patient or authorized representative who has indicated his/her understanding and acceptance.     Plan Discussed with: CRNA  Anesthesia Plan Comments:         Anesthesia Quick Evaluation

## 2017-11-25 NOTE — H&P (Signed)
The History and Physical notes are on paper, have been signed, and are to be scanned. The patient remains stable and unchanged from the H&P.   Previous H&P reviewed, patient examined, and there are no changes.  Charlene [name] 11/25/2017 7:26 AM

## 2017-11-25 NOTE — Op Note (Signed)
LOCATION:  [place]   PREOPERATIVE DIAGNOSIS:    Nuclear sclerotic cataract right eye. H25.11   POSTOPERATIVE DIAGNOSIS:  Nuclear sclerotic cataract right eye.     PROCEDURE:  Phacoemusification with posterior chamber intraocular lens placement of the right eye   LENS:   Implant Name Type Inv. Item Serial No. Manufacturer Lot No. LRB No. Used  LENS IOL DIOP 20.0 - [id number] Intraocular Lens LENS IOL DIOP 20.0 [id number]  Right 1        ULTRASOUND TIME: 23 % of 1 minutes, 23 seconds.  CDE 19.3   SURGEON:  Wyonia Hough, MD   ANESTHESIA:  Topical with tetracaine drops and 2% Xylocaine jelly, augmented with 1% preservative-free intracameral lidocaine.    COMPLICATIONS:  None.   DESCRIPTION OF PROCEDURE:  The patient was identified in the holding room and transported to the operating room and placed in the supine position under the operating microscope.  The right eye was identified as the operative eye and it was prepped and draped in the usual sterile ophthalmic fashion.   A 1 millimeter clear-corneal paracentesis was made at the 12:00 position.  0.5 ml of preservative-free 1% lidocaine was injected into the anterior chamber. The anterior chamber was filled with Viscoat viscoelastic.  A 2.4 millimeter keratome was used to make a near-clear corneal incision at the 9:00 position.  A curvilinear capsulorrhexis was made with a cystotome and capsulorrhexis forceps.  Balanced salt solution was used to hydrodissect and hydrodelineate the nucleus.   Phacoemulsification was then used in stop and chop fashion to remove the lens nucleus and epinucleus.  The remaining cortex was then removed using the irrigation and aspiration handpiece. Provisc was then placed into the capsular bag to distend it for lens placement.  A lens was then injected into the capsular bag.  The remaining viscoelastic was aspirated.   Wounds were hydrated with balanced salt solution.  The anterior  chamber was inflated to a physiologic pressure with balanced salt solution.  No wound leaks were noted. Cefuroxime 0.1 ml of a 10mg /ml solution was injected into the anterior chamber for a dose of 1 mg of intracameral antibiotic at the completion of the case.   Timolol and Brimonidine drops were applied to the eye.  The patient was taken to the recovery room in stable condition without complications of anesthesia or surgery.   Charlene [name] 11/25/2017, 8:21 AM

## 2017-11-25 NOTE — Transfer of Care (Signed)
Immediate Anesthesia Transfer of Care Note  Patient: Charlene [name]  Procedure(s) Performed: CATARACT EXTRACTION PHACO AND INTRAOCULAR LENS PLACEMENT (IOC) RIGHT DIABETIC (Right Eye)  Patient Location: PACU  Anesthesia Type: MAC  Level of Consciousness: awake, alert  and patient cooperative  Airway and Oxygen Therapy: Patient Spontanous Breathing and Patient connected to supplemental oxygen  Post-op Assessment: Post-op Vital signs reviewed, Patient's Cardiovascular Status Stable, Respiratory Function Stable, Patent Airway and No signs of Nausea or vomiting  Post-op Vital Signs: Reviewed and stable  Complications: No apparent anesthesia complications

## 2017-11-26 ENCOUNTER — Encounter: Payer: Self-pay | Admitting: Ophthalmology

## 2017-12-03 NOTE — Progress Notes (Signed)
Patient's Name: Charlene [name]  MRN: [id number]  Referring Provider: Tracie Harrier, MD  DOB: 12-03-1960  PCP: Tracie Harrier, MD  DOS: 12/07/2017  Note by: Gaspar Cola, MD  Service setting: Ambulatory outpatient  Specialty: Interventional Pain Management  Location: [facility] (AMB) Pain Management Facility    Patient type: Established   Primary Reason(s) for Visit: Encounter for post-procedure evaluation of chronic illness with mild to moderate exacerbation CC: Follow-up (Left knee injection follow up )  HPI  Charlene [name] is a 57 y.o. year old, female patient, who comes today for a post-procedure evaluation. She has Burning sensation of feet; DM type 2, uncontrolled, with neuropathy ([place]); GERD without esophagitis; Essential (primary) hypertension; Neuropathic pain; Neuropathy; Lower extremity numbness and tingling (Left); Polyneuropathy associated with underlying disease ([place]); Chronic ankle pain (Primary Area of Pain) (Left); Chronic foot pain (Primary Area of Pain) (Left); Chronic lower extremity pain (Secondary Area of Pain) (Bilateral) (L>R); Chronic knee pain (Fourth Area of Pain) (Bilateral) (L>R); Chronic low back pain [facility] Area of Pain) (Bilateral) (L>R) w/ sciatica (Left); Chronic pain syndrome; Opiate use; Pharmacologic therapy; Disorder of skeletal system; Problems influencing health status; Vitamin D deficiency; DM type 2 with diabetic peripheral neuropathy ([place]); DDD (degenerative disc disease), lumbar; Lumbar facet arthropathy; Lumbar facet syndrome (Bilateral); Lumbar facet hypertrophy; Lumbar foraminal stenosis (L5-S1) (Right); Osteoarthritis of knee (Bilateral); Chronic musculoskeletal pain; Long-term insulin use ([place]); Uncontrolled type 2 diabetes mellitus with hyperglycemia ([place]); and Abnormal EMG (07/06/2017) on their problem list. Her primarily concern today is the Follow-up (Left knee injection follow up )  Pain Assessment: Location: Left, Posterior, Anterior  Knee Radiating: Radiates from upper thigh, to knee, back of knee, to ankle and foot Onset: More than a month ago Duration: Chronic pain Quality: Constant, Aching, Burning, Tightness Severity: 2 /10 (subjective, self-reported pain score)  Note: Reported level is compatible with observation.                         When using our objective Pain Scale, levels between 6 and 10/10 are said to belong in an emergency room, as it progressively worsens from a 6/10, described as severely limiting, requiring emergency care not usually available at an outpatient pain management facility. At a 6/10 level, communication becomes difficult and requires great effort. Assistance to reach the emergency department may be required. Facial flushing and profuse sweating along with potentially dangerous increases in heart rate and blood pressure will be evident. Effect on ADL: "Messes with me sometimes. Can't stand for a long time. Makes me walk unsteady"  Timing: Constant Modifying factors: Gabapentin, tramadol helps ease the pain  BP: (!) 185/101  HR: 77  Charlene [name] comes in today for post-procedure evaluation.  The patient returns to the clinic today after having had a diagnostic left intra-articular knee joint injection with local anesthetic and steroid.  Although it did provide the patient with 100% relief of the pain for the duration of the local anesthetic, as soon as they wore off, the pain returned.  At this point, we will go ahead and try a series of 5 intra-articular Hyalgan knee injections.  Because the patient has been having problems with osteoarthritis of both knees, she has requested that we do both sides.  In addition to this, on the patient's next visit we will go ahead and do a diagnostic left-sided L5-S1 LESI #1 under fluoroscopic guidance and IV sedation to treat the component of her pain that we believe is  secondary to a radiculitis.  Further details on both, my assessment(s), as well as the proposed  treatment plan, please see below.  Post-Procedure Assessment  11/23/2017 Procedure: Diagnostic left intra-articular knee joint injection #1 (local anesthetic and steroid) Pre-procedure pain score:  2/10 Post-procedure pain score: 1/10 No initial benefit, possibly due to rapid discharge after no sedation procedure, without enough time to allow full onset of block. Influential Factors: BMI: 23.23 kg/m Intra-procedural challenges: None observed.         Assessment challenges: None detected.              Reported side-effects: None.        Post-procedural adverse reactions or complications: None reported         Sedation: No sedation used. When no sedatives are used, the analgesic levels obtained are directly associated to the effectiveness of the local anesthetics. However, when sedation is provided, the level of analgesia obtained during the initial 1 hour following the intervention, is believed to be the result of a combination of factors. These factors may include, but are not limited to: 1. The effectiveness of the local anesthetics used. 2. The effects of the analgesic(s) and/or anxiolytic(s) used. 3. The degree of discomfort experienced by the patient at the time of the procedure. 4. The patients ability and reliability in recalling and recording the events. 5. The presence and influence of possible secondary gains and/or psychosocial factors. Reported result: Relief experienced during the 1st hour after the procedure: 100 % (Ultra-Short Term Relief)            Interpretative annotation: Clinically appropriate result. No IV Analgesic or Anxiolytic given, therefore benefits are completely due to Local Anesthetic effects.          Effects of local anesthetic: The analgesic effects attained during this period are directly associated to the localized infiltration of local anesthetics and therefore cary significant diagnostic value as to the etiological location, or anatomical origin, of the  pain. Expected duration of relief is directly dependent on the pharmacodynamics of the local anesthetic used. Long-acting (4-6 hours) anesthetics used.  Reported result: Relief during the next 4 to 6 hour after the procedure: 100 % (Short-Term Relief)            Interpretative annotation: Clinically appropriate result. Analgesia during this period is likely to be Local Anesthetic-related.          Long-term benefit: Defined as the period of time past the expected duration of local anesthetics (1 hour for short-acting and 4-6 hours for long-acting). With the possible exception of prolonged sympathetic blockade from the local anesthetics, benefits during this period are typically attributed to, or associated with, other factors such as analgesic sensory neuropraxia, antiinflammatory effects, or beneficial biochemical changes provided by agents other than the local anesthetics.  Reported result: Extended relief following procedure: 0 %(Pain retuned the next day. No improvement since ) (Long-Term Relief)            Interpretative annotation: Clinically possible results. No benefit. Therapeutic failure. No significant inflammatory component detected.          Current benefits: Defined as reported results that persistent at this point in time.   Analgesia: 0 %            Function: Back to baseline ROM: Back to baseline Interpretative annotation: Recurrence of symptoms. Therapeutic failure. Results would argue against repeating knee injection with steroids.          Interpretation: Results would suggest  a successful diagnostic intervention.                  Plan:  Re-structuring of treatment plan. LESI + Hyalgan knee injection.          Laboratory Chemistry  Inflammation Markers (CRP: Acute Phase) (ESR: Chronic Phase) Lab Results  Component Value Date   CRP 4 10/12/2017   ESRSEDRATE 12 10/12/2017                         Rheumatology Markers No results found.  Renal Markers Lab Results   Component Value Date   BUN 10 10/12/2017   CREATININE 0.60 10/12/2017   BCR 17 10/12/2017   GFRAA 119 10/12/2017   GFRNONAA 103 10/12/2017                             Hepatic Markers Lab Results  Component Value Date   AST 16 10/12/2017   ALBUMIN 4.2 10/12/2017                        Neuropathy Markers Lab Results  Component Value Date   VITAMINB12 1,063 10/12/2017                        Hematology Parameters Lab Results  Component Value Date   PLT 300 08/22/2016   HGB 13.7 08/22/2016   HCT 41.3 08/22/2016                        CV Markers No results found.  Note: Lab results reviewed.  Recent Imaging Results   Results for orders placed in visit on 11/05/17  DG C-Arm 1-60 Min-No Report   Narrative Fluoroscopy was utilized by the requesting physician.  No radiographic  interpretation.    Interpretation Report: Fluoroscopy was used during the procedure to assist with needle guidance. The images were interpreted intraoperatively by the requesting physician.  Meds   Current Outpatient Medications:  .  baclofen (LIORESAL) 10 MG tablet, Take 1 tablet (10 mg total) by mouth 3 (three) times daily., Disp: 90 tablet, Rfl: 2 .  Calcium Carbonate-Vit D-Min (GNP CALCIUM 1200) 1200-1000 MG-UNIT CHEW, Chew 1,200 mg by mouth daily with breakfast. Take in combination with vitamin D and magnesium., Disp: 30 tablet, Rfl: 5 .  Cholecalciferol (VITAMIN D3) 5000 units CAPS, Take 1 capsule (5,000 Units total) by mouth daily with breakfast. Take along with calcium and magnesium., Disp: 30 capsule, Rfl: 5 .  DULoxetine (CYMBALTA) 30 MG capsule, Take 30 mg by mouth daily., Disp: , Rfl:  .  fluticasone (FLONASE) 50 MCG/ACT nasal spray, Place into both nostrils 2 (two) times daily., Disp: , Rfl:  .  gabapentin (NEURONTIN) 800 MG tablet, Take 800 mg by mouth 4 (four) times daily., Disp: , Rfl:  .  insulin aspart (NOVOLOG PENFILL) cartridge, Inject 15 Units into the skin 3 (three) times daily  before meals. , Disp: , Rfl:  .  Insulin Glargine (LANTUS SOLOSTAR) 100 UNIT/ML Solostar Pen, Inject 80 Units into the skin daily. At bedtime, Disp: , Rfl:  .  lidocaine (LMX) 4 % cream, Apply 1 application topically as needed., Disp: , Rfl:  .  liraglutide (VICTOZA) 18 MG/3ML SOPN, Inject 1.8 mg into the skin daily., Disp: , Rfl:  .  lisinopril (PRINIVIL,ZESTRIL) 40 MG tablet, Take 40 mg by mouth daily.,  Disp: , Rfl:  .  Magnesium 500 MG CAPS, Take 1 capsule (500 mg total) by mouth 2 (two) times daily at 8 am and 10 pm., Disp: 60 capsule, Rfl: 5 .  meloxicam (MOBIC) 15 MG tablet, Take 15 mg by mouth daily., Disp: , Rfl:  .  metFORMIN (GLUCOPHAGE) 500 MG tablet, Take 2 tablets by mouth daily. , Disp: , Rfl:  .  metoprolol succinate (TOPROL-XL) 25 MG 24 hr tablet, Take 25 mg by mouth daily., Disp: , Rfl:  .  pantoprazole (PROTONIX) 40 MG tablet, Take 40 mg by mouth 2 (two) times daily., Disp: , Rfl:  .  traMADol (ULTRAM) 50 MG tablet, Take 1 tablet (50 mg total) by mouth every 8 (eight) hours as needed for severe pain., Disp: 90 tablet, Rfl: 2  ROS  Constitutional: Denies any fever or chills Gastrointestinal: No reported hemesis, hematochezia, vomiting, or acute GI distress Musculoskeletal: Denies any acute onset joint swelling, redness, loss of ROM, or weakness Neurological: No reported episodes of acute onset apraxia, aphasia, dysarthria, agnosia, amnesia, paralysis, loss of coordination, or loss of consciousness  Allergies  Ms. Begue has No Known Allergies.  [place]  Drug: Ms. Blow  reports that she does not use drugs. Alcohol:  reports that she drank alcohol. Tobacco:  reports that she has never smoked. She has never used smokeless tobacco. Medical:  has a past medical history of Arthritis, Degenerative disc disease, lumbar, Diabetes mellitus without complication ([place]), GERD (gastroesophageal reflux disease), Hypertension, Neuropathy, Polyneuropathy, and Polyneuropathy. Surgical: Ms.  Arrasmith  has a past surgical history that includes arthroscopic rotator cuff; Colonoscopy; Abdominal hysterectomy; endosopic sinus; and Cataract extraction w/PHACO (Right, 11/25/2017). Family: family history includes Alcohol abuse in her father; Breast cancer in her maternal aunt; Cancer in her father.  Constitutional Exam  General appearance: Well nourished, well developed, and well hydrated. In no apparent acute distress Vitals:   12/07/17 0909 12/07/17 0912  BP: (!) 191/96 (!) 185/101  Pulse: 77   Resp: 16   Temp: 98.1 F (36.7 C)   SpO2: 100%   Weight: 115 lb (52.2 kg)   Height: '4\' 11"'$  (1.499 m)    BMI Assessment: Estimated body mass index is 23.23 kg/m as calculated from the following:   Height as of this encounter: '4\' 11"'$  (1.499 m).   Weight as of this encounter: 115 lb (52.2 kg).  BMI interpretation table: BMI level Category Range association with higher incidence of chronic pain  <18 kg/m2 Underweight   18.5-24.9 kg/m2 Ideal body weight   25-29.9 kg/m2 Overweight Increased incidence by 20%  30-34.9 kg/m2 Obese (Class I) Increased incidence by 68%  35-39.9 kg/m2 Severe obesity (Class II) Increased incidence by 136%  >40 kg/m2 Extreme obesity (Class III) Increased incidence by 254%   Patient's current BMI Ideal Body weight  Body mass index is 23.23 kg/m. Patient must be at least 60 in tall to calculate ideal body weight   BMI Readings from Last 4 Encounters:  12/07/17 23.23 kg/m  11/25/17 22.82 kg/m  11/23/17 23.23 kg/m  11/05/17 23.83 kg/m   Wt Readings from Last 4 Encounters:  12/07/17 115 lb (52.2 kg)  11/25/17 113 lb (51.3 kg)  11/23/17 115 lb (52.2 kg)  11/05/17 118 lb (53.5 kg)  Psych/Mental status: Alert, oriented x 3 (person, place, & time)       Eyes: PERLA Respiratory: No evidence of acute respiratory distress  Cervical Spine Area Exam  Skin & Axial Inspection: No masses, redness, edema, swelling, or  associated skin lesions Alignment:  Symmetrical Functional ROM: Unrestricted ROM      Stability: No instability detected Muscle Tone/Strength: Functionally intact. No obvious neuro-muscular anomalies detected. Sensory (Neurological): Unimpaired Palpation: No palpable anomalies              Upper Extremity (UE) Exam    Side: Right upper extremity  Side: Left upper extremity  Skin & Extremity Inspection: Skin color, temperature, and hair growth are WNL. No peripheral edema or cyanosis. No masses, redness, swelling, asymmetry, or associated skin lesions. No contractures.  Skin & Extremity Inspection: Skin color, temperature, and hair growth are WNL. No peripheral edema or cyanosis. No masses, redness, swelling, asymmetry, or associated skin lesions. No contractures.  Functional ROM: Unrestricted ROM          Functional ROM: Unrestricted ROM          Muscle Tone/Strength: Functionally intact. No obvious neuro-muscular anomalies detected.  Muscle Tone/Strength: Functionally intact. No obvious neuro-muscular anomalies detected.  Sensory (Neurological): Unimpaired          Sensory (Neurological): Unimpaired          Palpation: No palpable anomalies              Palpation: No palpable anomalies              Provocative Test(s):  Phalen's test: deferred Tinel's test: deferred Apley's scratch test (touch opposite shoulder):  Action 1 (Across chest): deferred Action 2 (Overhead): deferred Action 3 (LB reach): deferred   Provocative Test(s):  Phalen's test: deferred Tinel's test: deferred Apley's scratch test (touch opposite shoulder):  Action 1 (Across chest): deferred Action 2 (Overhead): deferred Action 3 (LB reach): deferred    Thoracic Spine Area Exam  Skin & Axial Inspection: No masses, redness, or swelling Alignment: Symmetrical Functional ROM: Unrestricted ROM Stability: No instability detected Muscle Tone/Strength: Functionally intact. No obvious neuro-muscular anomalies detected. Sensory (Neurological):  Unimpaired Muscle strength & Tone: No palpable anomalies  Lumbar Spine Area Exam  Skin & Axial Inspection: No masses, redness, or swelling Alignment: Symmetrical Functional ROM: Unrestricted ROM       Stability: No instability detected Muscle Tone/Strength: Functionally intact. No obvious neuro-muscular anomalies detected. Sensory (Neurological): Unimpaired Palpation: No palpable anomalies       Provocative Tests: Hyperextension/rotation test: deferred today       Lumbar quadrant test (Kemp's test): deferred today       Lateral bending test: deferred today       Patrick's Maneuver: deferred today                   FABER test: deferred today                   S-I anterior distraction/compression test: deferred today         S-I lateral compression test: deferred today         S-I Thigh-thrust test: deferred today         S-I Gaenslen's test: deferred today          Gait & Posture Assessment  Ambulation: Patient ambulates using a cane Gait: Significantly limited. Dependent on assistive device to ambulate Posture: WNL   Lower Extremity Exam    Side: Right lower extremity  Side: Left lower extremity  Stability: No instability observed          Stability: No instability observed          Skin & Extremity Inspection: Skin color, temperature, and hair growth  are WNL. No peripheral edema or cyanosis. No masses, redness, swelling, asymmetry, or associated skin lesions. No contractures.  Skin & Extremity Inspection: Skin color, temperature, and hair growth are WNL. No peripheral edema or cyanosis. No masses, redness, swelling, asymmetry, or associated skin lesions. No contractures.  Functional ROM: Unrestricted ROM                  Functional ROM: Unrestricted ROM                  Muscle Tone/Strength: Functionally intact. No obvious neuro-muscular anomalies detected.  Muscle Tone/Strength: Functionally intact. No obvious neuro-muscular anomalies detected.  Sensory (Neurological): Unimpaired  medial portion of foot (L4)  Sensory (Neurological): Unimpaired medial portion of foot (L4)  DTR: Patellar: deferred today Achilles: deferred today Plantar: deferred today  DTR: Patellar: deferred today Achilles: deferred today Plantar: deferred today  Palpation: No palpable anomalies  Palpation: No palpable anomalies   Assessment  Primary Diagnosis & Pertinent Problem List: The primary encounter diagnosis was Chronic knee pain (Fourth Area of Pain) (Bilateral) (L>R). Diagnoses of Chronic foot pain (Primary Area of Pain) (Left), Chronic ankle pain (Primary Area of Pain) (Left), Chronic lower extremity pain (Secondary Area of Pain) (Bilateral) (L>R), Chronic low back pain (Tertiary Area of Pain) (Bilateral) (L>R) w/ sciatica (Left), DDD (degenerative disc disease), lumbar, Lumbar foraminal stenosis (L5-S1) (Right), and Osteoarthritis of knee (Bilateral) were also pertinent to this visit.  Status Diagnosis  Persistent Unimproved Unimproved 1. Chronic knee pain (Fourth Area of Pain) (Bilateral) (L>R)   2. Chronic foot pain (Primary Area of Pain) (Left)   3. Chronic ankle pain (Primary Area of Pain) (Left)   4. Chronic lower extremity pain (Secondary Area of Pain) (Bilateral) (L>R)   5. Chronic low back pain (Tertiary Area of Pain) (Bilateral) (L>R) w/ sciatica (Left)   6. DDD (degenerative disc disease), lumbar   7. Lumbar foraminal stenosis (L5-S1) (Right)   8. Osteoarthritis of knee (Bilateral)     Problems updated and reviewed during this visit: No problems updated. Plan of Care  Pharmacotherapy (Medications Ordered): No orders of the defined types were placed in this encounter.  Medications administered today: Naama L. Christine had no medications administered during this visit.   Procedure Orders     Lumbar Epidural Injection     KNEE INJECTION Lab Orders  No laboratory test(s) ordered today   Imaging Orders  No imaging studies ordered today   Referral Orders  No  referral(s) requested today   Interventional management options: Planned, scheduled, and/or pending:   Results of nerve conduction test requested. Diagnostic left-sided L5-S1 interlaminar LESI #1 + bilateral IA Hyalgan knee injection #1 under fluoroscopic guidance and IV sedation  NEXT considering: Diagnostic bilateral vs left-sided lumbar sympathetic block #1 under fluoroscopic guidance and IV sedation  Diagnostic left-sided L5 transforaminal ESI #2 + diagnostic left-sided L4-5 interlaminar LESI #2 under fluoroscopic guidance and IV sedation   Considering:   Diagnostic bilateral vs left-sided lumbar sympathetic block Possible bilateral vs left-sided lumbar sympathetic RFA Diagnostic left-sided L4-5 interlaminar LESI Diagnostic right-sided L5 transforaminal ESI Diagnostic bilateral lumbar facet block Possible bilateral lumbar facet RFA Diagnostic bilateral intra-articular knee joint injection with local anesthetic and steroid Possible series of 5 bilateral intra-articular Hyalgan knee injections Diagnostic bilateral genicular nerve block Possible bilateral genicular nerve RFA Diagnostic left intra-articular knee joint injection #2 (local anesthetic and steroid)   Palliative PRN treatment(s):   Diagnostic left-sided L4-5 interlaminar LESI #2 under fluoroscopy and IV sedation.  Provider-requested follow-up: Return for Procedure (w/ sedation): (L) L5-S1 LESI #1 + (B) Hyalgan #1.  Future Appointments  Date Time Provider [place]  12/22/2017  9:45 AM Milinda Pointer, MD [facility] None  02/15/2018  8:30 AM Vevelyn Francois, NP [facility] None   Primary Care Physician: Tracie Harrier, MD Location: [facility] Outpatient Pain Management Facility Note by: Gaspar Cola, MD Date: 12/07/2017; Time: 9:57 AM

## 2017-12-07 ENCOUNTER — Ambulatory Visit: Payer: BLUE CROSS/BLUE SHIELD | Attending: Pain Medicine | Admitting: Pain Medicine

## 2017-12-07 ENCOUNTER — Other Ambulatory Visit: Payer: Self-pay

## 2017-12-07 ENCOUNTER — Encounter: Payer: Self-pay | Admitting: Pain Medicine

## 2017-12-07 VITALS — BP 185/101 | HR 77 | Temp 98.1°F | Resp 16 | Ht 59.0 in | Wt 115.0 lb

## 2017-12-07 DIAGNOSIS — G894 Chronic pain syndrome: Secondary | ICD-10-CM | POA: Insufficient documentation

## 2017-12-07 DIAGNOSIS — G8929 Other chronic pain: Secondary | ICD-10-CM

## 2017-12-07 DIAGNOSIS — M5137 Other intervertebral disc degeneration, lumbosacral region: Secondary | ICD-10-CM | POA: Diagnosis not present

## 2017-12-07 DIAGNOSIS — Z79891 Long term (current) use of opiate analgesic: Secondary | ICD-10-CM | POA: Insufficient documentation

## 2017-12-07 DIAGNOSIS — E559 Vitamin D deficiency, unspecified: Secondary | ICD-10-CM | POA: Diagnosis not present

## 2017-12-07 DIAGNOSIS — M25562 Pain in left knee: Secondary | ICD-10-CM

## 2017-12-07 DIAGNOSIS — M25572 Pain in left ankle and joints of left foot: Secondary | ICD-10-CM | POA: Diagnosis not present

## 2017-12-07 DIAGNOSIS — I10 Essential (primary) hypertension: Secondary | ICD-10-CM | POA: Insufficient documentation

## 2017-12-07 DIAGNOSIS — M25561 Pain in right knee: Secondary | ICD-10-CM | POA: Diagnosis not present

## 2017-12-07 DIAGNOSIS — Z79899 Other long term (current) drug therapy: Secondary | ICD-10-CM | POA: Insufficient documentation

## 2017-12-07 DIAGNOSIS — M5442 Lumbago with sciatica, left side: Secondary | ICD-10-CM

## 2017-12-07 DIAGNOSIS — E1140 Type 2 diabetes mellitus with diabetic neuropathy, unspecified: Secondary | ICD-10-CM | POA: Insufficient documentation

## 2017-12-07 DIAGNOSIS — M79605 Pain in left leg: Secondary | ICD-10-CM

## 2017-12-07 DIAGNOSIS — M79672 Pain in left foot: Secondary | ICD-10-CM | POA: Diagnosis not present

## 2017-12-07 DIAGNOSIS — Z794 Long term (current) use of insulin: Secondary | ICD-10-CM | POA: Insufficient documentation

## 2017-12-07 DIAGNOSIS — K219 Gastro-esophageal reflux disease without esophagitis: Secondary | ICD-10-CM | POA: Insufficient documentation

## 2017-12-07 DIAGNOSIS — M170 Bilateral primary osteoarthritis of knee: Secondary | ICD-10-CM | POA: Insufficient documentation

## 2017-12-07 DIAGNOSIS — M5136 Other intervertebral disc degeneration, lumbar region: Secondary | ICD-10-CM | POA: Insufficient documentation

## 2017-12-07 DIAGNOSIS — M48061 Spinal stenosis, lumbar region without neurogenic claudication: Secondary | ICD-10-CM | POA: Diagnosis not present

## 2017-12-07 DIAGNOSIS — Z791 Long term (current) use of non-steroidal anti-inflammatories (NSAID): Secondary | ICD-10-CM | POA: Insufficient documentation

## 2017-12-07 DIAGNOSIS — M79604 Pain in right leg: Secondary | ICD-10-CM

## 2017-12-07 NOTE — Patient Instructions (Addendum)
____________________________________________________________________________________________  Preparing for Procedure with Sedation  Instructions: . Oral Intake: Do not eat or drink anything for at least 8 hours prior to your procedure. . Transportation: Public transportation is not allowed. Bring an adult driver. The driver must be physically present in our waiting room before any procedure can be started. . Physical Assistance: Bring an adult physically capable of assisting you, in the event you need help. This adult should keep you company at home for at least 6 hours after the procedure. . Blood Pressure Medicine: Take your blood pressure medicine with a sip of water the morning of the procedure. . Blood thinners: Notify our staff if you are taking any blood thinners. Depending on which one you take, there will be specific instructions on how and when to stop it. . Diabetics on insulin: Notify the staff so that you can be scheduled 1st case in the morning. If your diabetes requires high dose insulin, take only  of your normal insulin dose the morning of the procedure and notify the staff that you have done so. . Preventing infections: Shower with an antibacterial soap the morning of your procedure. . Build-up your immune system: Take 1000 mg of Vitamin C with every meal (3 times a day) the day prior to your procedure. . Antibiotics: Inform the staff if you have a condition or reason that requires you to take antibiotics before dental procedures. . Pregnancy: If you are pregnant, call and cancel the procedure. . Sickness: If you have a cold, fever, or any active infections, call and cancel the procedure. . Arrival: You must be in the facility at least 30 minutes prior to your scheduled procedure. . Children: Do not bring children with you. . Dress appropriately: Bring dark clothing that you would not mind if they get stained. . Valuables: Do not bring any jewelry or valuables.  Procedure  appointments are reserved for interventional treatments only. . No Prescription Refills. . No medication changes will be discussed during procedure appointments. . No disability issues will be discussed.  Reasons to call and reschedule or cancel your procedure: (Following these recommendations will minimize the risk of a serious complication.) . Surgeries: Avoid having procedures within 2 weeks of any surgery. (Avoid for 2 weeks before or after any surgery). . Flu Shots: Avoid having procedures within 2 weeks of a flu shots or . (Avoid for 2 weeks before or after immunizations). . Barium: Avoid having a procedure within 7-10 days after having had a radiological study involving the use of radiological contrast. (Myelograms, Barium swallow or enema study). . Heart attacks: Avoid any elective procedures or surgeries for the initial 6 months after a "Myocardial Infarction" (Heart Attack). . Blood thinners: It is imperative that you stop these medications before procedures. Let us know if you if you take any blood thinner.  . Infection: Avoid procedures during or within two weeks of an infection (including chest colds or gastrointestinal problems). Symptoms associated with infections include: Localized redness, fever, chills, night sweats or profuse sweating, burning sensation when voiding, cough, congestion, stuffiness, runny nose, sore throat, diarrhea, nausea, vomiting, cold or Flu symptoms, recent or current infections. It is specially important if the infection is over the area that we intend to treat. . Heart and lung problems: Symptoms that may suggest an active cardiopulmonary problem include: cough, chest pain, breathing difficulties or shortness of breath, dizziness, ankle swelling, uncontrolled high or unusually low blood pressure, and/or palpitations. If you are experiencing any of these symptoms, cancel   your procedure and contact your primary care physician for an evaluation.  Remember:   Regular Business hours are:  Monday to Thursday 8:00 AM to 4:00 PM  Provider's Schedule: Milinda Pointer, MD:  Procedure days: Tuesday and Thursday 7:30 AM to 4:00 PM  Gillis Santa, MD:  Procedure days: Monday and Wednesday 7:30 AM to 4:00 PM ____________________________________________________________________________________________   Preparing for Procedure with Sedation Instructions: . Oral Intake: Do not eat or drink anything for at least 8 hours prior to your procedure. . Transportation: Public transportation is not allowed. Bring an adult driver. The driver must be physically present in our waiting room before any procedure can be started. Marland Kitchen Physical Assistance: Bring an adult capable of physically assisting you, in the event you need help. . Blood Pressure Medicine: Take your blood pressure medicine with a sip of water the morning of the procedure. . Insulin: Take only  of your normal insulin dose. . Preventing infections: Shower with an antibacterial soap the morning of your procedure. . Build-up your immune system: Take 1000 mg of Vitamin C with every meal (3 times a day) the day prior to your procedure. . Pregnancy: If you are pregnant, call and cancel the procedure. . Sickness: If you have a cold, fever, or any active infections, call and cancel the procedure. . Arrival: You must be in the facility at least 30 minutes prior to your scheduled procedure. . Children: Do not bring children with you. . Dress appropriately: Bring dark clothing that you would not mind if they get stained. . Valuables: Do not bring any jewelry or valuables. Procedure appointments are reserved for interventional treatments only. Marland Kitchen No Prescription Refills. . No medication changes will be discussed during procedure appointments. No disability issues will be discussed. Epidural Steroid Injection An epidural steroid injection is given to relieve pain in your neck, back, or legs that is caused by  the irritation or swelling of a nerve root. This procedure involves injecting a steroid and numbing medicine (anesthetic) into the epidural space. The epidural space is the space between the outer covering of your spinal cord and the bones that form your backbone (vertebra).  LET [facility] CARE PROVIDER KNOW ABOUT:  Any allergies you have. All medicines you are taking, including vitamins, herbs, eye drops, creams, and over-the-counter medicines such as aspirin. Previous problems you or members of your family have had with the use of anesthetics. Any blood disorders or blood clotting disorders you have. Previous surgeries you have had. Medical conditions you have.  RISKS AND COMPLICATIONS Generally, this is a safe procedure. However, as with any procedure, complications can occur. Possible complications of epidural steroid injection include: Headache. Bleeding. Infection. Allergic reaction to the medicines. Damage to your nerves. The response to this procedure depends on the underlying cause of the pain and its duration. People who have long-term (chronic) pain are less likely to benefit from epidural steroids than are those people whose pain comes on strong and suddenly.  BEFORE THE PROCEDURE  Ask your health care provider about changing or stopping your regular medicines. You may be advised to stop taking blood-thinning medicines a few days before the procedure. You may be given medicines to reduce anxiety. Arrange for someone to take you home after the procedure.  PROCEDURE  You will remain awake during the procedure. You may receive medicine to make you relaxed. You will be asked to lie on your stomach. The injection site will be cleaned. The injection site will be numbed with  a medicine (local anesthetic). A needle will be injected through your skin into the epidural space. Your health care provider will use an X-ray machine to ensure that the steroid is delivered closest to the  affected nerve. You may have minimal discomfort at this time. Once the needle is in the right position, the local anesthetic and the steroid will be injected into the epidural space. The needle will then be removed and a bandage will be applied to the injection site.  AFTER THE PROCEDURE  You may be monitored for a short time before you go home. You may feel weakness or numbness in your arm or leg, which disappears within hours. You may be allowed to eat, drink, and take your regular medicine. You may have soreness at the site of the injection.   This information is not intended to replace advice given to you by your health care provider. Make sure you discuss any questions you have with your health care provider.   Document Released: 04/15/2007 Document Revised: 09/08/2012 Document Reviewed: 06/25/2012 Elsevier Interactive Patient Education 2016 [place]  What are the risk, side effects and possible complications? Generally speaking, most procedures are safe.  However, with any procedure there are risks, side effects, and the possibility of complications.  The risks and complications are dependent upon the sites that are lesioned, or the type of nerve block to be performed.  The closer the procedure is to the spine, the more serious the risks are.  Great care is taken when placing the radio frequency needles, block needles or lesioning probes, but sometimes complications can occur. Infection: Any time there is an injection through the skin, there is a risk of infection.  This is why sterile conditions are used for these blocks. There are four possible types of infection: 1. Localized skin infection. 2. Central Nervous System Infection: This can be in the form of Meningitis, which can be deadly. 3. Epidural Infections: This can be in the form of an epidural abscess, which can cause pressure inside of the spine, causing compression of the spinal cord with  subsequent paralysis. This would require an emergency surgery to decompress, and there are no guarantees that the patient would recover from the paralysis. 4. Discitis: This is an infection of the intervertebral discs. It occurs in about 1% of discography procedures. It is difficult to treat and it may lead to surgery. Pain: the needles have to go through skin and soft tissues, will cause soreness. Damage to internal structures:  The nerves to be lesioned may be near blood vessels or other nerves which can be potentially damaged. Bleeding: Bleeding is more common if the patient is taking blood thinners such as  aspirin, Coumadin, Ticiid, Plavix, etc., or if he/she have some genetic predisposition such as hemophilia. Bleeding into the spinal canal can cause compression of the spinal  cord with subsequent paralysis.  This would require an emergency surgery to decompress and there are no guarantees that the patient would recover from the paralysis. Pneumothorax: Puncturing of a lung is a possibility, every time a needle is introduced in the area of the chest or upper back.  Pneumothorax refers to free air around the collapsed lung(s), inside of the thoracic cavity (chest cavity).  Another two possible complications related to a similar event would include: Hemothorax and Chylothorax. These are variations of the Pneumothorax, where instead of air around the collapsed lung(s), you may have blood or chyle, respectively. Spinal headaches: They  may occur with any procedures in the area of the spine. Persistent CSF (Cerebro-Spinal Fluid) leakage: This is a rare problem, but may occur with prolonged intrathecal or epidural catheters either due to the formation of a fistulous track or a dural tear. Nerve damage: By working so close to the spinal cord, there is always a possibility of nerve damage, which could be as serious as a permanent spinal cord injury with paralysis. Death: Although rare, severe deadly allergic  reactions known as "Anaphylactic reaction" can occur to any of the medications used. Worsening of the symptoms: We can always make thing worse.  What are the chances of something like this happening? Chances of any of this occuring are extremely low.  By statistics, you have more of a chance of getting killed in a motor vehicle accident: while driving to the hospital than any of the above occurring .  Nevertheless, you should be aware that they are possibilities.  In general, it is similar to taking a shower.  Everybody knows that you can slip, hit your head and get killed.  Does that mean that you should not shower again?  Nevertheless always keep in mind that statistics do not mean anything if you happen to be on the wrong side of them.  Even if a procedure has a 1 (one) in a 1,000,000 (million) chance of going wrong, it you happen to be that one..Also, keep in mind that by statistics, you have more of a chance of having something go wrong when taking medications.  Who should not have this procedure? If you are on a blood thinning medication (e.g. Coumadin, Plavix, see list of "Blood Thinners"), or if you have an active infection going on, you should not have the procedure.  If you are taking any blood thinners, please inform your physician.  Preparing for your procedure: Do not eat or drink anything at least eight (8) hours prior to the procedure. Bring a driver with you .  It cannot be a taxi. Come accompanied by an adult that can drive you back, and that is strong enough to help you if your legs get weak or numb from the local anesthetic. Take all of your medicines the morning of the procedure with just enough water to swallow them. If you have diabetes, make sure that you are scheduled to have your procedure done first thing in the morning, whenever possible. If you have diabetes, take only half of your insulin dose and notify our nurse that you have done so as soon as you arrive at the  clinic. If you are diabetic, but only take blood sugar pills (oral hypoglycemic), then do not take them on the morning of your procedure.  You may take them after you have had the procedure. Do not take aspirin or any aspirin-containing medications, at least eleven (11) days prior to the procedure.  They may prolong bleeding. Wear loose fitting clothing that may be easy to take off and that you would not mind if it got stained with Betadine or blood. Do not wear any jewelry or perfume Remove any nail coloring.  It will interfere with some of our monitoring equipment. If you take Metformin for your diabetes, stop it 48 hours prior to the procedure.  NOTE: Remember that this is not meant to be interpreted as a complete list of all possible complications.  Unforeseen problems may occur.  BLOOD THINNERS The following drugs contain aspirin or other products, which can cause increased bleeding during surgery  and should not be taken for 2 weeks prior to and 1 week after surgery.  If you should need take something for relief of minor pain, you may take acetaminophen which is found in Tylenol,m Datril, Anacin-3 and Panadol. It is not blood thinner. The products listed below are.  Do not take any of the products listed below in addition to any listed on your instruction sheet.  A.P.C or A.P.C with Codeine Codeine Phosphate Capsules #3 Ibuprofen Ridaura  ABC compound Congesprin Imuran rimadil  Advil Cope Indocin Robaxisal  Alka-Seltzer Effervescent Pain Reliever and Antacid Coricidin or Coricidin-D  Indomethacin Rufen  Alka-Seltzer plus Cold Medicine Cosprin Ketoprofen S-A-C Tablets  Anacin Analgesic Tablets or Capsules Coumadin Korlgesic Salflex  Anacin Extra Strength Analgesic tablets or capsules CP-2 Tablets Lanoril Salicylate  Anaprox Cuprimine Capsules Levenox Salocol  Anexsia-D Dalteparin Magan Salsalate  Anodynos Darvon compound Magnesium Salicylate Sine-off  Ansaid Dasin Capsules Magsal Sodium  Salicylate  Anturane Depen Capsules Marnal Soma  APF Arthritis pain formula Dewitt's Pills Measurin Stanback  Argesic Dia-Gesic Meclofenamic Sulfinpyrazone  Arthritis Bayer Timed Release Aspirin Diclofenac Meclomen Sulindac  Arthritis pain formula Anacin Dicumarol Medipren Supac  Analgesic (Safety coated) Arthralgen Diffunasal Mefanamic Suprofen  Arthritis Strength Bufferin Dihydrocodeine Mepro Compound Suprol  Arthropan liquid Dopirydamole Methcarbomol with Aspirin Synalgos  ASA tablets/Enseals Disalcid Micrainin Tagament  Ascriptin Doan's Midol Talwin  Ascriptin A/D Dolene Mobidin Tanderil  Ascriptin Extra Strength Dolobid Moblgesic Ticlid  Ascriptin with Codeine Doloprin or Doloprin with Codeine Momentum Tolectin  Asperbuf Duoprin Mono-gesic Trendar  Aspergum Duradyne Motrin or Motrin IB Triminicin  Aspirin plain, buffered or enteric coated Durasal Myochrisine Trigesic  Aspirin Suppositories Easprin Nalfon Trillsate  Aspirin with Codeine Ecotrin Regular or Extra Strength Naprosyn Uracel  Atromid-S Efficin Naproxen Ursinus  Auranofin Capsules Elmiron Neocylate Vanquish  Axotal Emagrin Norgesic Verin  Azathioprine Empirin or Empirin with Codeine Normiflo Vitamin E  Azolid Emprazil Nuprin Voltaren  Bayer Aspirin plain, buffered or children's or timed BC Tablets or powders Encaprin Orgaran Warfarin Sodium  Buff-a-Comp Enoxaparin Orudis Zorpin  Buff-a-Comp with Codeine Equegesic Os-Cal-Gesic   Buffaprin Excedrin plain, buffered or Extra Strength Oxalid   Bufferin Arthritis Strength Feldene Oxphenbutazone   Bufferin plain or Extra Strength Feldene Capsules Oxycodone with Aspirin   Bufferin with Codeine Fenoprofen Fenoprofen Pabalate or Pabalate-SF   Buffets II Flogesic Panagesic   Buffinol plain or Extra Strength Florinal or Florinal with Codeine Panwarfarin   Buf-Tabs Flurbiprofen Penicillamine   Butalbital Compound Four-way cold tablets Penicillin   Butazolidin Fragmin Pepto-Bismol    Carbenicillin Geminisyn Percodan   Carna Arthritis Reliever Geopen Persantine   Carprofen Gold's salt Persistin   Chloramphenicol Goody's Phenylbutazone   Chloromycetin Haltrain Piroxlcam   Clmetidine heparin Plaquenil   Cllnoril Hyco-pap Ponstel   Clofibrate Hydroxy chloroquine Propoxyphen         . Before stopping any of these medications, be sure to consult the physician who ordered them.  Some, such as Coumadin (Warfarin) are ordered to prevent or treat serious conditions such as "deep thrombosis", "pumonary embolisms", and other heart problems.  The amount of time that you may need off of the medication may also vary with the medication and the reason for which you were taking it.  If you are taking any of these medications, please make sure you notify your pain physician before you undergo any procedures.

## 2017-12-07 NOTE — Progress Notes (Signed)
Safety precautions to be maintained throughout the outpatient stay will include: orient to surroundings, keep bed in low position, maintain call bell within reach at all times, provide assistance with transfer out of bed and ambulation.  

## 2017-12-17 ENCOUNTER — Emergency Department
Admission: EM | Admit: 2017-12-17 | Discharge: 2017-12-17 | Disposition: A | Discharge status: Home / Self Care | Payer: BLUE CROSS/BLUE SHIELD | Attending: Student in an Organized Health Care Education/Training Program | Admitting: Student in an Organized Health Care Education/Training Program

## 2017-12-17 ENCOUNTER — Emergency Department: Payer: BLUE CROSS/BLUE SHIELD

## 2017-12-17 ENCOUNTER — Other Ambulatory Visit: Payer: Self-pay

## 2017-12-17 DIAGNOSIS — I10 Essential (primary) hypertension: Secondary | ICD-10-CM | POA: Diagnosis not present

## 2017-12-17 DIAGNOSIS — Z794 Long term (current) use of insulin: Secondary | ICD-10-CM | POA: Diagnosis not present

## 2017-12-17 DIAGNOSIS — R109 Unspecified abdominal pain: Secondary | ICD-10-CM | POA: Diagnosis not present

## 2017-12-17 DIAGNOSIS — Z79899 Other long term (current) drug therapy: Secondary | ICD-10-CM | POA: Diagnosis not present

## 2017-12-17 DIAGNOSIS — R112 Nausea with vomiting, unspecified: Secondary | ICD-10-CM | POA: Diagnosis not present

## 2017-12-17 DIAGNOSIS — R002 Palpitations: Secondary | ICD-10-CM | POA: Diagnosis not present

## 2017-12-17 DIAGNOSIS — E1140 Type 2 diabetes mellitus with diabetic neuropathy, unspecified: Secondary | ICD-10-CM | POA: Insufficient documentation

## 2017-12-17 DIAGNOSIS — R1031 Right lower quadrant pain: Secondary | ICD-10-CM | POA: Diagnosis present

## 2017-12-17 LAB — URINALYSIS, COMPLETE (UACMP) WITH MICROSCOPIC
Bacteria, UA: NONE SEEN
Bilirubin Urine: NEGATIVE
Hgb urine dipstick: NEGATIVE
Ketones, ur: 20 mg/dL — AB
Leukocytes, UA: NEGATIVE
Nitrite: NEGATIVE
PH: 8 (ref 5.0–8.0)
PROTEIN: NEGATIVE mg/dL
SPECIFIC GRAVITY, URINE: 1.025 (ref 1.005–1.030)

## 2017-12-17 LAB — CBC WITH DIFFERENTIAL/PLATELET
Abs Immature Granulocytes: 0.04 10*3/uL (ref 0.00–0.07)
BASOS ABS: 0.1 10*3/uL (ref 0.0–0.1)
Basophils Relative: 1 %
EOS ABS: 0.1 10*3/uL (ref 0.0–0.5)
Eosinophils Relative: 1 %
HEMATOCRIT: 37.7 % (ref 36.0–46.0)
HEMOGLOBIN: 12.5 g/dL (ref 12.0–15.0)
IMMATURE GRANULOCYTES: 0 %
LYMPHS ABS: 1.6 10*3/uL (ref 0.7–4.0)
LYMPHS PCT: 16 %
MCH: 28.6 pg (ref 26.0–34.0)
MCHC: 33.2 g/dL (ref 30.0–36.0)
MCV: 86.3 fL (ref 80.0–100.0)
Monocytes Absolute: 0.3 10*3/uL (ref 0.1–1.0)
Monocytes Relative: 3 %
NEUTROS ABS: 8.1 10*3/uL — AB (ref 1.7–7.7)
NEUTROS PCT: 79 %
Platelets: 296 10*3/uL (ref 150–400)
RBC: 4.37 MIL/uL (ref 3.87–5.11)
RDW: 12.4 % (ref 11.5–15.5)
WBC: 10.1 10*3/uL (ref 4.0–10.5)
nRBC: 0 % (ref 0.0–0.2)

## 2017-12-17 LAB — COMPREHENSIVE METABOLIC PANEL
ALBUMIN: 4.1 g/dL (ref 3.5–5.0)
ALT: 18 U/L (ref 0–44)
AST: 18 U/L (ref 15–41)
Alkaline Phosphatase: 76 U/L (ref 38–126)
Anion gap: 12 (ref 5–15)
BILIRUBIN TOTAL: 0.7 mg/dL (ref 0.3–1.2)
BUN: 14 mg/dL (ref 6–20)
CO2: 28 mmol/L (ref 22–32)
Calcium: 9.9 mg/dL (ref 8.9–10.3)
Chloride: 98 mmol/L (ref 98–111)
Creatinine, Ser: 0.45 mg/dL (ref 0.44–1.00)
GFR calc Af Amer: >60 mL/min (ref >60)
GFR calc non Af Amer: >60 mL/min (ref >60)
GLUCOSE: 347 mg/dL — AB (ref 70–99)
POTASSIUM: 4.1 mmol/L (ref 3.5–5.1)
Sodium: 138 mmol/L (ref 135–145)
TOTAL PROTEIN: 7.2 g/dL (ref 6.5–8.1)

## 2017-12-17 MED ORDER — MORPHINE SULFATE (PF) 4 MG/ML IV SOLN
4.0000 mg | INTRAVENOUS | Status: DC | PRN
  Administered 2017-12-17: 4 mg via INTRAVENOUS
  Filled 2017-12-17: qty 1

## 2017-12-17 MED ORDER — CLONIDINE HCL 0.1 MG PO TABS
0.1000 mg | ORAL_TABLET | Freq: Once | ORAL | Status: AC
  Administered 2017-12-17: 0.1 mg via ORAL
  Filled 2017-12-17: qty 1

## 2017-12-17 MED ORDER — LISINOPRIL 10 MG PO TABS
ORAL_TABLET | ORAL | Status: AC
  Filled 2017-12-17: qty 1

## 2017-12-17 MED ORDER — HYDROCODONE-ACETAMINOPHEN 5-325 MG PO TABS
1.0000 | ORAL_TABLET | Freq: Once | ORAL | Status: AC
  Administered 2017-12-17: 1 via ORAL
  Filled 2017-12-17: qty 1

## 2017-12-17 MED ORDER — AMOXICILLIN 500 MG PO CAPS: 500.0000 mg | ORAL_CAPSULE | Freq: Once | ORAL | Status: DC

## 2017-12-17 MED ORDER — IOPAMIDOL (ISOVUE-300) INJECTION 61%
100.0000 mL | Freq: Once | INTRAVENOUS | Status: AC | PRN
  Administered 2017-12-17: 100 mL via INTRAVENOUS

## 2017-12-17 MED ORDER — LISINOPRIL 10 MG PO TABS
40.0000 mg | ORAL_TABLET | Freq: Once | ORAL | Status: AC
  Administered 2017-12-17: 40 mg via ORAL
  Filled 2017-12-17: qty 4

## 2017-12-17 MED ORDER — CLONIDINE HCL 0.1 MG PO TABS
0.1000 mg | ORAL_TABLET | Freq: Once | ORAL | Status: DC
  Filled 2017-12-17: qty 1

## 2017-12-17 MED ORDER — SODIUM CHLORIDE 0.9 % IV BOLUS
500.0000 mL | Freq: Once | INTRAVENOUS | Status: AC
  Administered 2017-12-17: 500 mL via INTRAVENOUS

## 2017-12-17 MED ORDER — METOPROLOL SUCCINATE ER 50 MG PO TB24
50.0000 mg | ORAL_TABLET | Freq: Every day | ORAL | Status: DC
  Administered 2017-12-17: 50 mg via ORAL
  Filled 2017-12-17: qty 1

## 2017-12-17 MED ORDER — KETOROLAC TROMETHAMINE 30 MG/ML IJ SOLN
15.0000 mg | Freq: Once | INTRAMUSCULAR | Status: DC
  Filled 2017-12-17: qty 1

## 2017-12-17 MED ORDER — HYDROCODONE-ACETAMINOPHEN 5-325 MG PO TABS: 1.0000 | ORAL_TABLET | ORAL | 0 refills | Status: DC | PRN

## 2017-12-17 MED ORDER — METOPROLOL SUCCINATE ER 50 MG PO TB24: 50.0000 mg | ORAL_TABLET | Freq: Every day | ORAL | Status: DC

## 2017-12-17 MED ORDER — PROMETHAZINE HCL 25 MG/ML IJ SOLN
12.5000 mg | Freq: Four times a day (QID) | INTRAMUSCULAR | Status: DC | PRN
  Administered 2017-12-17: 12.5 mg via INTRAVENOUS
  Filled 2017-12-17: qty 1

## 2017-12-17 MED ORDER — POLYETHYLENE GLYCOL 3350 17 G PO PACK: 17.0000 g | PACK | Freq: Every day | ORAL | 0 refills | Status: AC

## 2017-12-17 NOTE — Discharge Instructions (Signed)

## 2017-12-17 NOTE — ED Triage Notes (Signed)
Pt states she woke up this morning with palpitations, right hip pain and "funny feeling" to entire face. Denies any hx of the same, states felt normal when she went to bed.

## 2017-12-17 NOTE — ED Notes (Signed)
Pt EKG completed & urine colllected by this Probation officer

## 2017-12-17 NOTE — ED Provider Notes (Signed)
[facility] Emergency Department Provider Note    First MD Initiated Contact with Patient 12/17/17 [phone]     (approximate)  I have reviewed the triage vital signs and the nursing notes.   HISTORY  Chief Complaint Palpitations    HPI Charlene [name] is a 57 y.o. female presents the ER for evaluation of right lower quadrant abdominal pain associate with nausea and vomiting.  No measured fevers.  States that when she has severe onset of pain she does develop palpitations and chest pain with this.  Denies any new numbness or tingling.  Denies any blurry vision.  Is never had pain like this before.  She status post partial hysterectomy.  Did not take her blood pressure medications this morning due to the pain she just wanted to come to the ER to be evaluated.    Past Medical History:  Diagnosis Date  . Arthritis    knees  . Degenerative disc disease, lumbar   . Diabetes mellitus without complication ([place])   . GERD (gastroesophageal reflux disease)   . Hypertension   . Neuropathy    left lower leg  . Polyneuropathy   . Polyneuropathy    Family History  Problem Relation Age of Onset  . Breast cancer Maternal Aunt   . Cancer Father   . Alcohol abuse Father    Past Surgical History:  Procedure Laterality Date  . ABDOMINAL HYSTERECTOMY    . arthroscopic rotator cuff    . CATARACT EXTRACTION W/PHACO Right 11/25/2017   Procedure: CATARACT EXTRACTION PHACO AND INTRAOCULAR LENS PLACEMENT ([place]) RIGHT DIABETIC;  Surgeon: Leandrew Koyanagi, MD;  Location: [place];  Service: Ophthalmology;  Laterality: Right;  Diabetic - insulin and oral meds  . COLONOSCOPY    . endosopic sinus     Patient Active Problem List   Diagnosis Date Noted  . Abnormal EMG (07/06/2017) 11/23/2017  . Long-term insulin use ([place]) 11/10/2017  . Uncontrolled type 2 diabetes mellitus with hyperglycemia ([place]) 11/10/2017  . Vitamin D deficiency 10/21/2017  . DM type 2 with  diabetic peripheral neuropathy ([place]) 10/21/2017  . DDD (degenerative disc disease), lumbar 10/21/2017  . Lumbar facet arthropathy 10/21/2017  . Lumbar facet syndrome (Bilateral) 10/21/2017  . Lumbar facet hypertrophy 10/21/2017  . Lumbar foraminal stenosis (L5-S1) (Right) 10/21/2017  . Osteoarthritis of knee (Bilateral) 10/21/2017  . Chronic musculoskeletal pain 10/21/2017  . Essential (primary) hypertension 10/12/2017  . Chronic ankle pain (Primary Area of Pain) (Left) 10/12/2017  . Chronic foot pain (Primary Area of Pain) (Left) 10/12/2017  . Chronic lower extremity pain (Secondary Area of Pain) (Bilateral) (L>R) 10/12/2017  . Chronic knee pain (Fourth Area of Pain) (Bilateral) (L>R) 10/12/2017  . Chronic low back pain [facility] Area of Pain) (Bilateral) (L>R) w/ sciatica (Left) 10/12/2017  . Chronic pain syndrome 10/12/2017  . Opiate use 10/12/2017  . Pharmacologic therapy 10/12/2017  . Disorder of skeletal system 10/12/2017  . Problems influencing health status 10/12/2017  . Neuropathy 08/11/2017  . Burning sensation of feet 07/06/2017  . Lower extremity numbness and tingling (Left) 07/06/2017  . Polyneuropathy associated with underlying disease ([place]) 05/29/2017  . DM type 2, uncontrolled, with neuropathy ([place]) 05/05/2017  . Neuropathic pain 06/29/2015  . GERD without esophagitis 01/16/2014      Prior to Admission medications   Medication Sig Start Date End Date Taking? Authorizing Provider  baclofen (LIORESAL) 10 MG tablet Take 1 tablet (10 mg total) by mouth 3 (three) times daily. 11/23/17 02/21/18 Yes  Milinda Pointer, MD  Calcium Carbonate-Vit D-Min [facility] CALCIUM 1200) 1200-1000 MG-UNIT CHEW Chew 1,200 mg by mouth daily with breakfast. Take in combination with vitamin D and magnesium. 10/21/17 04/19/18 Yes Milinda Pointer, MD  Cholecalciferol (VITAMIN D3) 5000 units CAPS Take 1 capsule (5,000 Units total) by mouth daily with breakfast. Take along with calcium and magnesium.  10/21/17 04/19/18 Yes Milinda Pointer, MD  gabapentin (NEURONTIN) 800 MG tablet Take 800 mg by mouth 4 (four) times daily.   Yes [provider]  Insulin Glargine, 2 Unit Dial, (TOUJEO MAX SOLOSTAR) 300 UNIT/ML SOPN Inject 100 Units into the skin at bedtime.   Yes [provider]  lidocaine (LMX) 4 % cream Apply 1 application topically as needed.   Yes [provider]  Lidocaine-Menthol (ICY HOT LIDOCAINE PLUS MENTHOL EX) Apply 1 application topically.   Yes [provider]  liraglutide (VICTOZA) 18 MG/3ML SOPN Inject 1.8 mg into the skin daily.   Yes [provider]  lisinopril (PRINIVIL,ZESTRIL) 40 MG tablet Take 40 mg by mouth daily.   Yes [provider]  Magnesium 500 MG CAPS Take 1 capsule (500 mg total) by mouth 2 (two) times daily at 8 am and 10 pm. 10/21/17 04/19/18 Yes Milinda Pointer, MD  meloxicam (MOBIC) 15 MG tablet Take 15 mg by mouth daily.   Yes [provider]  metFORMIN (GLUCOPHAGE) 500 MG tablet Take 2 tablets by mouth daily.    Yes [provider]  metoprolol succinate (TOPROL-XL) 50 MG 24 hr tablet Take 25 mg by mouth 2 (two) times daily.    Yes [provider]  pantoprazole (PROTONIX) 40 MG tablet Take 40 mg by mouth 2 (two) times daily.   Yes [provider]  traMADol (ULTRAM) 50 MG tablet Take 1 tablet (50 mg total) by mouth every 8 (eight) hours as needed for severe pain. 11/23/17 02/21/18 Yes Milinda Pointer, MD  DULoxetine (CYMBALTA) 30 MG capsule Take 30 mg by mouth daily.    [provider]  fluticasone (FLONASE) 50 MCG/ACT nasal spray Place into both nostrils 2 (two) times daily.    [provider]  HYDROcodone-acetaminophen (NORCO) 5-325 MG tablet Take 1 tablet by mouth every 4 (four) hours as needed for moderate pain. 12/17/17   Merlyn Lot, MD  insulin aspart (NOVOLOG PENFILL) cartridge Inject 15 Units into the skin 3 (three) times daily before meals.   03/29/15   [provider]  Insulin Glargine (LANTUS SOLOSTAR) 100 UNIT/ML Solostar Pen Inject 80 Units into the skin daily. At bedtime 03/29/15   [provider]  polyethylene glycol (MIRALAX / GLYCOLAX) packet Take 17 g by mouth daily. Mix one tablespoon with 8oz of your favorite juice or water every day until you are having soft formed stools. Then start taking once daily if you didn't have a stool the day before. 12/17/17   Merlyn Lot, MD    Allergies Patient has no known allergies.    Social History Social History   Tobacco Use  . Smoking status: Never Smoker  . Smokeless tobacco: Never Used  Substance Use Topics  . Alcohol use: Not Currently    Alcohol/week: 0.0 - 1.0 standard drinks  . Drug use: No    Review of Systems Patient denies headaches, rhinorrhea, blurry vision, numbness, shortness of breath, chest pain, edema, cough, abdominal pain, nausea, vomiting, diarrhea, dysuria, fevers, rashes or hallucinations unless otherwise stated above in HPI. ____________________________________________   PHYSICAL EXAM:  VITAL SIGNS: Vitals:   12/17/17 1100  12/17/17 1130  BP: (!) 199/105 (!) 195/105  Pulse:  87  Resp: 18 17  Temp:    SpO2:  100%    Constitutional: Alert and oriented.  Eyes: Conjunctivae are normal.  Head: Atraumatic. Nose: No congestion/rhinnorhea. Mouth/Throat: Mucous membranes are moist.   Neck: No stridor. Painless ROM.  Cardiovascular: Normal rate, regular rhythm. Grossly normal heart sounds.  Good peripheral circulation. Respiratory: Normal respiratory effort.  No retractions. Lungs CTAB. Gastrointestinal: Soft with mild ttp in rlq. No distention. No abdominal bruits. No CVA tenderness. Genitourinary: deferred Musculoskeletal: No lower extremity tenderness nor edema.  No joint effusions. Neurologic:  Normal speech and language. No gross focal neurologic deficits are appreciated. No facial droop Skin:  Skin is warm, dry and  intact. No rash noted. Psychiatric: Mood and affect are normal. Speech and behavior are normal.  ____________________________________________   LABS (all labs ordered are listed, but only abnormal results are displayed)  Results for orders placed or performed during the hospital encounter of 12/17/17 (from the past 24 hour(s))  CBC with Differential/Platelet     Status: Abnormal   Collection Time: 12/17/17  7:29 AM  Result Value Ref Range   WBC 10.1 4.0 - 10.5 K/uL   RBC 4.37 3.87 - 5.11 MIL/uL   Hemoglobin 12.5 12.0 - 15.0 g/dL   HCT 37.7 36.0 - 46.0 %   MCV 86.3 80.0 - 100.0 fL   MCH 28.6 26.0 - 34.0 pg   MCHC 33.2 30.0 - 36.0 g/dL   RDW 12.4 11.5 - 15.5 %   Platelets 296 150 - 400 K/uL   nRBC 0.0 0.0 - 0.2 %   Neutrophils Relative % 79 %   Neutro Abs 8.1 (H) 1.7 - 7.7 K/uL   Lymphocytes Relative 16 %   Lymphs Abs 1.6 0.7 - 4.0 K/uL   Monocytes Relative 3 %   Monocytes Absolute 0.3 0.1 - 1.0 K/uL   Eosinophils Relative 1 %   Eosinophils Absolute 0.1 0.0 - 0.5 K/uL   Basophils Relative 1 %   Basophils Absolute 0.1 0.0 - 0.1 K/uL   Immature Granulocytes 0 %   Abs Immature Granulocytes 0.04 0.00 - 0.07 K/uL  Comprehensive metabolic panel     Status: Abnormal   Collection Time: 12/17/17  7:29 AM  Result Value Ref Range   Sodium 138 135 - 145 mmol/L   Potassium 4.1 3.5 - 5.1 mmol/L   Chloride 98 98 - 111 mmol/L   CO2 28 22 - 32 mmol/L   Glucose, Bld 347 (H) 70 - 99 mg/dL   BUN 14 6 - 20 mg/dL   Creatinine, Ser 0.45 0.44 - 1.00 mg/dL   Calcium 9.9 8.9 - 10.3 mg/dL   Total Protein 7.2 6.5 - 8.1 g/dL   Albumin 4.1 3.5 - 5.0 g/dL   AST 18 15 - 41 U/L   ALT 18 0 - 44 U/L   Alkaline Phosphatase 76 38 - 126 U/L   Total Bilirubin 0.7 0.3 - 1.2 mg/dL   GFR calc non Af Amer >60 >60 mL/min   GFR calc Af Amer >60 >60 mL/min   Anion gap 12 5 - 15  Urinalysis, Complete w Microscopic     Status: Abnormal   Collection Time: 12/17/17  7:29 AM  Result Value Ref Range   Color,  Urine STRAW (A) YELLOW   APPearance CLEAR (A) CLEAR   Specific Gravity, Urine 1.025 1.005 - 1.030   pH 8.0 5.0 - 8.0   Glucose, UA >=  500 (A) NEGATIVE mg/dL   Hgb urine dipstick NEGATIVE NEGATIVE   Bilirubin Urine NEGATIVE NEGATIVE   Ketones, ur 20 (A) NEGATIVE mg/dL   Protein, ur NEGATIVE NEGATIVE mg/dL   Nitrite NEGATIVE NEGATIVE   Leukocytes, UA NEGATIVE NEGATIVE   RBC / HPF 0-5 0 - 5 RBC/hpf   WBC, UA 0-5 0 - 5 WBC/hpf   Bacteria, UA NONE SEEN NONE SEEN   Squamous Epithelial / LPF 0-5 0 - 5   ____________________________________________  EKG My review and personal interpretation at Time: 7:29   Indication: abd pain  Rate: 80  Rhythm: sinus Axis: normal Other: nonsepcific st abn, poor r wave progression, no stemi ____________________________________________  RADIOLOGY  I personally reviewed all radiographic images ordered to evaluate for the above acute complaints and reviewed radiology reports and findings.  These findings were personally discussed with the patient.  Please see medical record for radiology report.  ____________________________________________   PROCEDURES  Procedure(s) performed:  Procedures    Critical Care performed: no ____________________________________________   INITIAL IMPRESSION / ASSESSMENT AND PLAN / ED COURSE  Pertinent labs & imaging results that were available during my care of the patient were reviewed by me and considered in my medical decision making (see chart for details).   DDX: appendicitis, stone, pyel, hernia,   ALISCIA CLAYTON is a 57 y.o. who presents to the ED with symptoms as described above.  Patient nontoxic-appearing and afebrile.  Given her pain will get the abdomen pelvis to evaluate for acute abnormality.  Will give pain medication.  Patient is also not taking her antihypertensive medications this morning.  Will re-dose those.  Clinical Course as of Dec 17 1153  Thu Dec 17, 2017  1058 CT imaging is reassuring.  No  evidence of appendicitis.  Doubt infectious process.  Doubt vascular abnormality.   [PR]  1153 Pain improved.   Blood pressure improving as well.  At this point believe she stable and appropriate for outpatient follow-up.   [PR]    Clinical Course User Index [PR] Merlyn Lot, MD     As part of my medical decision making, I reviewed the following data within the [place] notes reviewed and incorporated, Labs reviewed, notes from prior ED visits.   ____________________________________________   FINAL CLINICAL IMPRESSION(S) / ED DIAGNOSES  Final diagnoses:  Flank pain  Hypertension, unspecified type      NEW MEDICATIONS STARTED DURING THIS VISIT:  New Prescriptions   HYDROCODONE-ACETAMINOPHEN (NORCO) 5-325 MG TABLET    Take 1 tablet by mouth every 4 (four) hours as needed for moderate pain.   POLYETHYLENE GLYCOL (MIRALAX / GLYCOLAX) PACKET    Take 17 g by mouth daily. Mix one tablespoon with 8oz of your favorite juice or water every day until you are having soft formed stools. Then start taking once daily if you didn't have a stool the day before.     Note:  This document was prepared using Dragon voice recognition software and may include unintentional dictation errors.    Merlyn Lot, MD 12/17/17 1155

## 2017-12-22 ENCOUNTER — Encounter: Payer: Self-pay | Admitting: Pain Medicine

## 2017-12-22 ENCOUNTER — Ambulatory Visit
Admission: RE | Admit: 2017-12-22 | Discharge: 2017-12-22 | Disposition: A | Discharge status: Home / Self Care | Payer: BLUE CROSS/BLUE SHIELD | Source: Ambulatory Visit | Attending: Pain Medicine | Admitting: Pain Medicine

## 2017-12-22 ENCOUNTER — Ambulatory Visit (HOSPITAL_BASED_OUTPATIENT_CLINIC_OR_DEPARTMENT_OTHER): Payer: BLUE CROSS/BLUE SHIELD | Admitting: Pain Medicine

## 2017-12-22 ENCOUNTER — Other Ambulatory Visit: Payer: Self-pay

## 2017-12-22 VITALS — BP 184/101 | HR 75 | Temp 97.5°F | Resp 16 | Ht 59.0 in | Wt 113.0 lb

## 2017-12-22 DIAGNOSIS — M79605 Pain in left leg: Secondary | ICD-10-CM | POA: Insufficient documentation

## 2017-12-22 DIAGNOSIS — M5116 Intervertebral disc disorders with radiculopathy, lumbar region: Secondary | ICD-10-CM | POA: Insufficient documentation

## 2017-12-22 DIAGNOSIS — M25562 Pain in left knee: Secondary | ICD-10-CM | POA: Diagnosis not present

## 2017-12-22 DIAGNOSIS — M25561 Pain in right knee: Secondary | ICD-10-CM

## 2017-12-22 DIAGNOSIS — M5136 Other intervertebral disc degeneration, lumbar region: Secondary | ICD-10-CM | POA: Diagnosis not present

## 2017-12-22 DIAGNOSIS — Z79899 Other long term (current) drug therapy: Secondary | ICD-10-CM | POA: Diagnosis not present

## 2017-12-22 DIAGNOSIS — M170 Bilateral primary osteoarthritis of knee: Secondary | ICD-10-CM | POA: Insufficient documentation

## 2017-12-22 DIAGNOSIS — Z794 Long term (current) use of insulin: Secondary | ICD-10-CM | POA: Diagnosis not present

## 2017-12-22 DIAGNOSIS — Z79891 Long term (current) use of opiate analgesic: Secondary | ICD-10-CM | POA: Insufficient documentation

## 2017-12-22 DIAGNOSIS — G8929 Other chronic pain: Secondary | ICD-10-CM

## 2017-12-22 DIAGNOSIS — M48061 Spinal stenosis, lumbar region without neurogenic claudication: Secondary | ICD-10-CM

## 2017-12-22 DIAGNOSIS — F419 Anxiety disorder, unspecified: Secondary | ICD-10-CM | POA: Diagnosis not present

## 2017-12-22 DIAGNOSIS — Z7951 Long term (current) use of inhaled steroids: Secondary | ICD-10-CM | POA: Insufficient documentation

## 2017-12-22 DIAGNOSIS — M5442 Lumbago with sciatica, left side: Secondary | ICD-10-CM

## 2017-12-22 DIAGNOSIS — Z791 Long term (current) use of non-steroidal anti-inflammatories (NSAID): Secondary | ICD-10-CM | POA: Insufficient documentation

## 2017-12-22 DIAGNOSIS — M79604 Pain in right leg: Secondary | ICD-10-CM

## 2017-12-22 MED ORDER — LACTATED RINGERS IV SOLN
1000.0000 mL | Freq: Once | INTRAVENOUS | Status: AC
  Administered 2017-12-22: 1000 mL via INTRAVENOUS

## 2017-12-22 MED ORDER — MIDAZOLAM HCL 5 MG/5ML IJ SOLN
1.0000 mg | INTRAMUSCULAR | Status: DC | PRN
  Administered 2017-12-22: 2 mg via INTRAVENOUS
  Filled 2017-12-22: qty 5

## 2017-12-22 MED ORDER — ROPIVACAINE HCL 2 MG/ML IJ SOLN
4.0000 mL | Freq: Once | INTRAMUSCULAR | Status: DC
  Filled 2017-12-22: qty 10

## 2017-12-22 MED ORDER — IOPAMIDOL (ISOVUE-M 200) INJECTION 41%
10.0000 mL | Freq: Once | INTRAMUSCULAR | Status: AC
  Administered 2017-12-22: 1 mL via EPIDURAL
  Filled 2017-12-22: qty 10

## 2017-12-22 MED ORDER — LIDOCAINE HCL 2 % IJ SOLN
20.0000 mL | Freq: Once | INTRAMUSCULAR | Status: AC
  Administered 2017-12-22: 400 mg
  Filled 2017-12-22: qty 40

## 2017-12-22 MED ORDER — SODIUM HYALURONATE (VISCOSUP) 20 MG/2ML IX SOSY
2.0000 mL | PREFILLED_SYRINGE | Freq: Once | INTRA_ARTICULAR | Status: AC
  Administered 2017-12-22: 2 mL via INTRA_ARTICULAR

## 2017-12-22 MED ORDER — ROPIVACAINE HCL 2 MG/ML IJ SOLN
2.0000 mL | Freq: Once | INTRAMUSCULAR | Status: AC
  Administered 2017-12-22: 2 mL via EPIDURAL
  Filled 2017-12-22: qty 10

## 2017-12-22 MED ORDER — FENTANYL CITRATE (PF) 100 MCG/2ML IJ SOLN
25.0000 ug | INTRAMUSCULAR | Status: DC | PRN
  Administered 2017-12-22: 50 ug via INTRAVENOUS
  Filled 2017-12-22: qty 2

## 2017-12-22 MED ORDER — LIDOCAINE HCL (PF) 1 % IJ SOLN
4.0000 mL | Freq: Once | INTRAMUSCULAR | Status: AC
  Administered 2017-12-22: 4 mL
  Filled 2017-12-22: qty 5

## 2017-12-22 MED ORDER — TRIAMCINOLONE ACETONIDE 40 MG/ML IJ SUSP
40.0000 mg | Freq: Once | INTRAMUSCULAR | Status: AC
  Administered 2017-12-22: 40 mg
  Filled 2017-12-22: qty 1

## 2017-12-22 MED ORDER — SODIUM CHLORIDE 0.9% FLUSH
2.0000 mL | Freq: Once | INTRAVENOUS | Status: AC
  Administered 2017-12-22: 2 mL

## 2017-12-22 NOTE — Patient Instructions (Signed)
____________________________________________________________________________________________  Post-Procedure Discharge Instructions  Instructions:  Apply ice: Fill a plastic sandwich bag with crushed ice. Cover it with a small towel and apply to injection site. Apply for 15 minutes then remove x 15 minutes. Repeat sequence on day of procedure, until you go to bed. The purpose is to minimize swelling and discomfort after procedure.  Apply heat: Apply heat to procedure site starting the day following the procedure. The purpose is to treat any soreness and discomfort from the procedure.  Food intake: Start with clear liquids (like water) and advance to regular food, as tolerated.   Physical activities: Keep activities to a minimum for the first 8 hours after the procedure.   Driving: If you have received any sedation, you are not allowed to drive for 24 hours after your procedure.  Blood thinner: Restart your blood thinner 6 hours after your procedure. (Only for those taking blood thinners)  Insulin: As soon as you can eat, you may resume your normal dosing schedule. (Only for those taking insulin)  Infection prevention: Keep procedure site clean and dry.  Post-procedure Pain Diary: Extremely important that this be done correctly and accurately. Recorded information will be used to determine the next step in treatment.  Pain evaluated is that of treated area only. Do not include pain from an untreated area.  Complete every hour, on the hour, for the initial 8 hours. Set an alarm to help you do this part accurately.  Do not go to sleep and have it completed later. It will not be accurate.  Follow-up appointment: Keep your follow-up appointment after the procedure. Usually 2 weeks for most procedures. (6 weeks in the case of radiofrequency.) Bring you pain diary.   Expect:  From numbing medicine (AKA: Local Anesthetics): Numbness or decrease in pain.  Onset: Full effect within 15  minutes of injected.  Duration: It will depend on the type of local anesthetic used. On the average, 1 to 8 hours.   From steroids: Decrease in swelling or inflammation. Once inflammation is improved, relief of the pain will follow.  Onset of benefits: Depends on the amount of swelling present. The more swelling, the longer it will take for the benefits to be seen. In some cases, up to 10 days.  Duration: Steroids will stay in the system x 2 weeks. Duration of benefits will depend on multiple posibilities including persistent irritating factors.  Occasional side-effects: Facial flushing (red, warm cheeks) , cramps (if present, drink Gatorade and take over-the-counter Magnesium 450-500 mg once to twice a day).  From procedure: Some discomfort is to be expected once the numbing medicine wears off. This should be minimal if ice and heat are applied as instructed.  Call if:  You experience numbness and weakness that gets worse with time, as opposed to wearing off.  New onset bowel or bladder incontinence. (This applies to Spinal procedures only)  Emergency Numbers:  Durning business hours (Monday - Thursday, 8:00 AM - 4:00 PM) (Friday, 9:00 AM - 12:00 Noon): [phone]  After hours: [phone] ____________________________________________________________________________________________   ____________________________________________________________________________________________  Preparing for your procedure (without sedation)  Instructions: . Oral Intake: Do not eat or drink anything for at least 3 hours prior to your procedure. . Transportation: Unless otherwise stated by your physician, you may drive yourself after the procedure. . Blood Pressure Medicine: Take your blood pressure medicine with a sip of water the morning of the procedure. . Blood thinners: Notify our staff if you are taking any blood  thinners. Depending on which one you take, there will be specific  instructions on how and when to stop it. . Diabetics on insulin: Notify the staff so that you can be scheduled 1st case in the morning. If your diabetes requires high dose insulin, take only  of your normal insulin dose the morning of the procedure and notify the staff that you have done so. . Preventing infections: Shower with an antibacterial soap the morning of your procedure.  . Build-up your immune system: Take 1000 mg of Vitamin C with every meal (3 times a day) the day prior to your procedure. Marland Kitchen Antibiotics: Inform the staff if you have a condition or reason that requires you to take antibiotics before dental procedures. . Pregnancy: If you are pregnant, call and cancel the procedure. . Sickness: If you have a cold, fever, or any active infections, call and cancel the procedure. . Arrival: You must be in the facility at least 30 minutes prior to your scheduled procedure. . Children: Do not bring any children with you. . Dress appropriately: Bring dark clothing that you would not mind if they get stained. . Valuables: Do not bring any jewelry or valuables.  Procedure appointments are reserved for interventional treatments only. Marland Kitchen No Prescription Refills. . No medication changes will be discussed during procedure appointments. . No disability issues will be discussed.  Reasons to call and reschedule or cancel your procedure: (Following these recommendations will minimize the risk of a serious complication.) . Surgeries: Avoid having procedures within 2 weeks of any surgery. (Avoid for 2 weeks before or after any surgery). . Flu Shots: Avoid having procedures within 2 weeks of a flu shots or . (Avoid for 2 weeks before or after immunizations). . Barium: Avoid having a procedure within 7-10 days after having had a radiological study involving the use of radiological contrast. (Myelograms, Barium swallow or enema study). . Heart attacks: Avoid any elective procedures or surgeries for the  initial 6 months after a "Myocardial Infarction" (Heart Attack). . Blood thinners: It is imperative that you stop these medications before procedures. Let us know if you if you take any blood thinner.  . Infection: Avoid procedures during or within two weeks of an infection (including chest colds or gastrointestinal problems). Symptoms associated with infections include: Localized redness, fever, chills, night sweats or profuse sweating, burning sensation when voiding, cough, congestion, stuffiness, runny nose, sore throat, diarrhea, nausea, vomiting, cold or Flu symptoms, recent or current infections. It is specially important if the infection is over the area that we intend to treat. Marland Kitchen Heart and lung problems: Symptoms that may suggest an active cardiopulmonary problem include: cough, chest pain, breathing difficulties or shortness of breath, dizziness, ankle swelling, uncontrolled high or unusually low blood pressure, and/or palpitations. If you are experiencing any of these symptoms, cancel your procedure and contact your primary care physician for an evaluation.  Remember:  Regular Business hours are:  Monday to Thursday 8:00 AM to 4:00 PM  Provider's Schedule: Milinda Pointer, MD:  Procedure days: Tuesday and Thursday 7:30 AM to 4:00 PM  Gillis Santa, MD:  Procedure days: Monday and Wednesday 7:30 AM to 4:00 PM ____________________________________________________________________________________________

## 2017-12-22 NOTE — Progress Notes (Addendum)
Patient's Name: Charlene [name]  MRN: [id number]  Referring Provider: Tracie Harrier, MD  DOB: [birth year]  PCP: Tracie Harrier, MD  DOS: 12/22/2017  Note by: Gaspar Cola, MD  Service setting: Ambulatory outpatient  Specialty: Interventional Pain Management  Patient type: Established  Location: [facility] (AMB) Pain Management Facility  Visit type: Interventional Procedure   Primary Reason for Visit: Interventional Pain Management Treatment. CC: Leg Pain (left) and Knee Pain (bilateral)  Procedure #1:  Anesthesia, Analgesia, Anxiolysis:  Type: Therapeutic Intra-Articular Hyalgan Knee Injection #1  Region: Lateral infrapatellar Knee Region Level: Knee Joint Laterality: Bilateral  Type: Local Anesthesia Indication(s): Analgesia         Local Anesthetic: Lidocaine 1-2% Route: Infiltration ([place]/IM) IV Access: Declined Sedation: Declined   Position: Sitting   Indications: 1. Osteoarthritis of knee (Bilateral)   2. Chronic knee pain (Fourth Area of Pain) (Bilateral) (L>R)    Procedure #2:  Anesthesia, Analgesia, Anxiolysis:  Type: Diagnostic Inter-Laminar Epidural Steroid Injection  #1  Region: Lumbar Level: L5-S1 Level. Laterality: Left-Sided Paramedial  Type: Moderate (Conscious) Sedation combined with Local Anesthesia Indication(s): Analgesia and Anxiety Route: Intravenous (IV) IV Access: Secured Sedation: Meaningful verbal contact was maintained at all times during the procedure  Local Anesthetic: Lidocaine 1-2%  Position: Prone with head of the table was raised to facilitate breathing.   Indications: 1. DDD (degenerative disc disease), lumbar   2. Lumbar foraminal stenosis (L5-S1) (Right)   3. Chronic lower extremity pain (Secondary Area of Pain) (Bilateral) (L>R)   4. 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 (year  old), female patient, seen today for interventional treatment. She  has a past surgical history that includes arthroscopic rotator cuff; Colonoscopy; Abdominal hysterectomy; endosopic sinus; and Cataract extraction w/PHACO (Right, 11/25/2017). Charlene [name] has a current medication list which includes the following prescription(s): baclofen, gnp calcium 1200, vitamin d3, duloxetine, fluticasone, gabapentin, hydrocodone-acetaminophen, insulin aspart, insulin glargine, insulin glargine (2 unit dial), lidocaine, lidocaine-menthol, liraglutide, lisinopril, magnesium, meloxicam, metformin, metoprolol succinate, pantoprazole, polyethylene glycol, and tramadol, and the following Facility-Administered Medications: fentanyl, midazolam, and ropivacaine (pf) 2 mg/ml (0.2%). Her primarily concern today is the Leg Pain (left) and Knee Pain (bilateral)  Initial Vital Signs:  Pulse/HCG Rate: 75ECG Heart Rate: 80 Temp: 98.5 F (36.9 C) Resp: 16 BP: (!) 182/92 SpO2: 100 %  BMI: Estimated body mass index is 22.82 kg/m as calculated from the following:   Height as of this encounter: 4\' 11"  (1.499 m).   Weight as of this encounter: 113 lb (51.3 kg).  Risk Assessment: Allergies: Reviewed. She has No Known Allergies.  Allergy Precautions: None required Coagulopathies: Reviewed. None identified.  Blood-thinner therapy: None at this time Active Infection(s): Reviewed. None identified. Charlene [name] is afebrile  Site Confirmation: Charlene [name] was asked to confirm the procedure and laterality before marking the site Procedure checklist: Completed Consent: Before the procedure and under the influence of no sedative(s), amnesic(s), or anxiolytics, the patient was informed of the treatment options, risks and possible complications. To fulfill our ethical and legal obligations, as recommended by the American Medical Association's Code of Ethics, I have informed the patient of my clinical impression; the nature and purpose of the  treatment or procedure; the risks, benefits, and possible complications of the intervention; the alternatives, including doing nothing; the risk(s) and benefit(s) of the alternative treatment(s) or procedure(s);  and the risk(s) and benefit(s) of doing nothing. The patient was provided information about the general risks and possible complications associated with the procedure. These may include, but are not limited to: failure to achieve desired goals, infection, bleeding, organ or nerve damage, allergic reactions, paralysis, and death. In addition, the patient was informed of those risks and complications associated to the procedure, such as failure to decrease pain; infection; bleeding; organ or nerve damage with subsequent damage to sensory, motor, and/or autonomic systems, resulting in permanent pain, numbness, and/or weakness of one or several areas of the body; allergic reactions; (i.e.: anaphylactic reaction); and/or death. In addition, the patient was informed of those risks and complications associated to Spine-related procedures, such as failure to decrease pain; infection (i.e.: Meningitis, epidural or intraspinal abscess); bleeding (i.e.: epidural hematoma, subarachnoid hemorrhage, or any other type of intraspinal or peri-dural bleeding); organ or nerve damage (i.e.: Any type of peripheral nerve, nerve root, or spinal cord injury) with subsequent damage to sensory, motor, and/or autonomic systems, resulting in permanent pain, numbness, and/or weakness of one or several areas of the body; allergic reactions; (i.e.: anaphylactic reaction); and/or death. Furthermore, the patient was informed of those risks and complications associated with the medications. These include, but are not limited to: allergic reactions (i.e.: anaphylactic or anaphylactoid reaction(s)); adrenal axis suppression; blood sugar elevation that in diabetics may result in ketoacidosis or comma; water retention that in patients with  history of congestive heart failure may result in shortness of breath, pulmonary edema, and decompensation with resultant heart failure; weight gain; swelling or edema; medication-induced neural toxicity; particulate matter embolism and blood vessel occlusion with resultant organ, and/or nervous system infarction; and/or aseptic necrosis of one or more joints. Furthermore, the patient was informed of those risks and complications associated with the medications. These include, but are not limited to: allergic reactions (i.e.: anaphylactic or anaphylactoid reaction(s)); adrenal axis suppression; blood sugar elevation that in diabetics may result in ketoacidosis or comma; water retention that in patients with history of congestive heart failure may result in shortness of breath, pulmonary edema, and decompensation with resultant heart failure; weight gain; swelling or edema; medication-induced neural toxicity; particulate matter embolism and blood vessel occlusion with resultant organ, and/or nervous system infarction; and/or aseptic necrosis of one or more joints. Finally, the patient was informed that Medicine is not an exact science; therefore, there is also the possibility of unforeseen or unpredictable risks and/or possible complications that may result in a catastrophic outcome. The patient indicated having understood very clearly. We have given the patient no guarantees and we have made no promises. Enough time was given to the patient to ask questions, all of which were answered to the patient's satisfaction. Ms. Macfarlane has indicated that she wanted to continue with the procedure. Attestation: I, the ordering provider, attest that I have discussed with the patient the benefits, risks, side-effects, alternatives, likelihood of achieving goals, and potential problems during recovery for the procedure that I have provided informed consent. Date  Time: 12/22/2017  9:37 AM  Pre-Procedure Preparation:   Monitoring: As per clinic protocol. Respiration, ETCO2, SpO2, BP, heart rate and rhythm monitor placed and checked for adequate function Safety Precautions: Patient was assessed for positional comfort and pressure points before starting the procedure. Time-out: I initiated and conducted the "Time-out" before starting the procedure, as per protocol. The patient was asked to participate by confirming the accuracy of the "Time Out" information. Verification of the correct person, site, and procedure were performed and  confirmed by me, the nursing staff, and the patient. "Time-out" conducted as per Joint Commission's Universal Protocol (UP.01.01.01). Time: 4098(1191 for the epidural)  Description of Procedure #1:  Target Area: Knee Joint Approach: Just above the Lateral tibial plateau, lateral to the infrapatellar tendon. Area Prepped: Entire knee area, from the mid-thigh to the mid-shin. Prepping solution: ChloraPrep (2% chlorhexidine gluconate and 70% isopropyl alcohol) Safety Precautions: Aspiration looking for blood return was conducted prior to all injections. At no point did we inject any substances, as a needle was being advanced. No attempts were made at seeking any paresthesias. Safe injection practices and needle disposal techniques used. Medications properly checked for expiration dates. SDV (single dose vial) medications used. Description of the Procedure: Protocol guidelines were followed. The patient was placed in position over the fluoroscopy table. The target area was identified and the area prepped in the usual manner. Skin & deeper tissues infiltrated with local anesthetic. Appropriate amount of time allowed to pass for local anesthetics to take effect. The procedure needles were then advanced to the target area. Proper needle placement secured. Negative aspiration confirmed. Solution injected in intermittent fashion, asking for systemic symptoms every 0.5cc of injectate. The needles were  then removed and the area cleansed, making sure to leave some of the prepping solution back to take advantage of its long term bactericidal properties.  Start Time: 1015 hrs. End Time: 1020 hrs. Materials:  Needle(s) Type: Regular needle Gauge: 25G Length: 1.5-in Medication(s): Please see orders for medications and dosing details.  Imaging Guidance for procedure #1:  Type of Imaging Technique: None used Indication(s): N/A Exposure Time: No patient exposure Contrast: None used. Fluoroscopic Guidance: N/A Ultrasound Guidance: N/A Interpretation: N/A  Description of Procedure #2:  Target Area: The interlaminar space, initially targeting the lower laminar border of the superior vertebral body. Approach: Paramedial approach. Area Prepped: Entire Posterior Lumbar Region Prepping solution: ChloraPrep (2% chlorhexidine gluconate and 70% isopropyl alcohol) Safety Precautions: Aspiration looking for blood return was conducted prior to all injections. At no point did we inject any substances, as a needle was being advanced. No attempts were made at seeking any paresthesias. Safe injection practices and needle disposal techniques used. Medications properly checked for expiration dates. SDV (single dose vial) medications used. Description of the Procedure: Protocol guidelines were followed. The procedure needle was introduced through the skin, ipsilateral to the reported pain, and advanced to the target area. Bone was contacted and the needle walked caudad, until the lamina was cleared. The epidural space was identified using "loss-of-resistance technique" with 2-3 ml of PF-NaCl (0.9% NSS), in a 5cc LOR glass syringe.  Vitals:   12/22/17 1040 12/22/17 1050 12/22/17 1100 12/22/17 1110  BP: (!) 156/85 (!) 175/97 (!) 183/100 (!) 184/101  Pulse:      Resp: 12 14 14 16   Temp:  97.8 F (36.6 C)  (!) 97.5 F (36.4 C)  TempSrc:      SpO2: 100% 100% 100% 100%  Weight:      Height:       Start Time:  1035 hrs. (for the epidural) End Time: 1040 hrs. (for the epidural)  Materials:  Needle(s) Type: Epidural needle Gauge: 17G Length: 3.5-in Medication(s): Please see orders for medications and dosing details.  Imaging Guidance (Spinal) for procedure #2:  Type of Imaging Technique: Fluoroscopy Guidance (Spinal) Indication(s): Assistance in needle guidance and placement for procedures requiring needle placement in or near specific anatomical locations not easily accessible without such assistance. Exposure Time: Please see nurses  notes. Contrast: Before injecting any contrast, we confirmed that the patient did not have an allergy to iodine, shellfish, or radiological contrast. Once satisfactory needle placement was completed at the desired level, radiological contrast was injected. Contrast injected under live fluoroscopy. No contrast complications. See chart for type and volume of contrast used. Fluoroscopic Guidance: I was personally present during the use of fluoroscopy. "Tunnel Vision Technique" used to obtain the best possible view of the target area. Parallax error corrected before commencing the procedure. "Direction-depth-direction" technique used to introduce the needle under continuous pulsed fluoroscopy. Once target was reached, antero-posterior, oblique, and lateral fluoroscopic projection used confirm needle placement in all planes. Images permanently stored in EMR. Interpretation: I personally interpreted the imaging intraoperatively. Adequate needle placement confirmed in multiple planes. Appropriate spread of contrast into desired area was observed. No evidence of afferent or efferent intravascular uptake. No intrathecal or subarachnoid spread observed. Permanent images saved into the patient's record.  Antibiotic Prophylaxis:   Anti-infectives (From admission, onward)   None     Indication(s): None identified  Post-operative Assessment:  Post-procedure Vital Signs:  Pulse/HCG  Rate: 7590 Temp: (!) 97.5 F (36.4 C) Resp: 16 BP: (!) 184/101 SpO2: 100 %  EBL: None  Complications: No immediate post-treatment complications observed by team, or reported by patient.  Note: The patient tolerated the entire procedure well. A repeat set of vitals were taken after the procedure and the patient was kept under observation following institutional policy, for this type of procedure. Post-procedural neurological assessment was performed, showing return to baseline, prior to discharge. The patient was provided with post-procedure discharge instructions, including a section on how to identify potential problems. Should any problems arise concerning this procedure, the patient was given instructions to immediately contact us, at any time, without hesitation. In any case, we plan to contact the patient by telephone for a follow-up status report regarding this interventional procedure.  Comments:  No additional relevant information.  Plan of Care   Imaging Orders     DG C-Arm 1-60 Min-No Report  Procedure Orders     KNEE INJECTION     KNEE INJECTION     Lumbar Epidural Injection  Medications ordered for procedure: Meds ordered this encounter  Medications  . lidocaine (PF) (XYLOCAINE) 1 % injection 4 mL  . ropivacaine (PF) 2 mg/mL (0.2%) (NAROPIN) injection 4 mL  . Sodium Hyaluronate SOSY 2 mL  . Sodium Hyaluronate SOSY 2 mL  . iopamidol (ISOVUE-M) 41 % intrathecal injection 10 mL    Must be Myelogram-compatible. If not available, you may substitute with a water-soluble, non-ionic, hypoallergenic, myelogram-compatible radiological contrast medium.  Marland Kitchen lidocaine (XYLOCAINE) 2 % (with pres) injection 400 mg  . midazolam (VERSED) 5 MG/5ML injection 1-2 mg    Make sure Flumazenil is available in the pyxis when using this medication. If oversedation occurs, administer 0.2 mg IV over 15 sec. If after 45 sec no response, administer 0.2 mg again over 1 min; may repeat at 1 min  intervals; not to exceed 4 doses (1 mg)  . fentaNYL (SUBLIMAZE) injection 25-50 mcg    Make sure Narcan is available in the pyxis when using this medication. In the event of respiratory depression (RR< 8/min): Titrate NARCAN (naloxone) in increments of 0.1 to 0.2 mg IV at 2-3 minute intervals, until desired degree of reversal.  . lactated ringers infusion 1,000 mL  . sodium chloride flush (NS) 0.9 % injection 2 mL  . ropivacaine (PF) 2 mg/mL (0.2%) (NAROPIN) injection 2  mL  . triamcinolone acetonide (KENALOG-40) injection 40 mg   Medications administered: We administered lidocaine (PF), Sodium Hyaluronate, Sodium Hyaluronate, iopamidol, lidocaine, midazolam, fentaNYL, lactated ringers, sodium chloride flush, ropivacaine (PF) 2 mg/mL (0.2%), and triamcinolone acetonide.  See the medical record for exact dosing, route, and time of administration.  Disposition: Discharge home  Discharge Date & Time: 12/22/2017; 1113 hrs.   Physician-requested Follow-up: Return for PPE (2 wks) + Procedure (no sedation): (B) Hyalgan  #2.  Future Appointments  Date Time Provider [place]  01/05/2018  9:00 AM Milinda Pointer, MD [facility] None  02/15/2018  8:30 AM Vevelyn Francois, NP [facility] None   Primary Care Physician: Tracie Harrier, MD Location: [facility] Outpatient Pain Management Facility Note by: Gaspar Cola, MD Date: 12/22/2017; Time: 12:19 PM  Disclaimer:  Medicine is not an Chief Strategy Officer. The only guarantee in medicine is that nothing is guaranteed. It is important to note that the decision to proceed with this intervention was based on the information collected from the patient. The Data and conclusions were drawn from the patient's questionnaire, the interview, and the physical examination. Because the information was provided in large part by the patient, it cannot be guaranteed that it has not been purposely or unconsciously manipulated. Every effort has been made to obtain as  much relevant data as possible for this evaluation. It is important to note that the conclusions that lead to this procedure are derived in large part from the available data. Always take into account that the treatment will also be dependent on availability of resources and existing treatment guidelines, considered by other Pain Management Practitioners as being common knowledge and practice, at the time of the intervention. For Medico-Legal purposes, it is also important to point out that variation in procedural techniques and pharmacological choices are the acceptable norm. The indications, contraindications, technique, and results of the above procedure should only be interpreted and judged by a Board-Certified Interventional Pain Specialist with extensive familiarity and expertise in the same exact procedure and technique.

## 2017-12-22 NOTE — Progress Notes (Signed)
Safety precautions to be maintained throughout the outpatient stay will include: orient to surroundings, keep bed in low position, maintain call bell within reach at all times, provide assistance with transfer out of bed and ambulation.  

## 2017-12-23 ENCOUNTER — Telehealth: Payer: Self-pay | Admitting: *Deleted

## 2017-12-23 NOTE — Telephone Encounter (Signed)
Left voicemail for patient to call if there are questions or concerns re; procedure on yesterday.

## 2018-01-05 ENCOUNTER — Other Ambulatory Visit: Payer: Self-pay

## 2018-01-05 ENCOUNTER — Ambulatory Visit: Payer: BLUE CROSS/BLUE SHIELD | Attending: Pain Medicine | Admitting: Pain Medicine

## 2018-01-05 ENCOUNTER — Telehealth: Payer: Self-pay | Admitting: Pain Medicine

## 2018-01-05 ENCOUNTER — Encounter: Payer: Self-pay | Admitting: Pain Medicine

## 2018-01-05 VITALS — BP 155/81 | HR 87 | Temp 98.2°F | Ht 59.0 in | Wt 113.0 lb

## 2018-01-05 DIAGNOSIS — G8929 Other chronic pain: Secondary | ICD-10-CM | POA: Insufficient documentation

## 2018-01-05 DIAGNOSIS — M25562 Pain in left knee: Secondary | ICD-10-CM

## 2018-01-05 DIAGNOSIS — Z79899 Other long term (current) drug therapy: Secondary | ICD-10-CM | POA: Insufficient documentation

## 2018-01-05 DIAGNOSIS — Z794 Long term (current) use of insulin: Secondary | ICD-10-CM | POA: Diagnosis not present

## 2018-01-05 DIAGNOSIS — M25561 Pain in right knee: Secondary | ICD-10-CM

## 2018-01-05 DIAGNOSIS — M170 Bilateral primary osteoarthritis of knee: Secondary | ICD-10-CM | POA: Diagnosis present

## 2018-01-05 MED ORDER — SODIUM HYALURONATE (VISCOSUP) 20 MG/2ML IX SOSY
2.0000 mL | PREFILLED_SYRINGE | Freq: Once | INTRA_ARTICULAR | Status: AC
  Administered 2018-01-05: 2 mL via INTRA_ARTICULAR

## 2018-01-05 MED ORDER — LIDOCAINE HCL (PF) 1 % IJ SOLN
INTRAMUSCULAR | Status: AC
  Filled 2018-01-05: qty 5

## 2018-01-05 MED ORDER — ROPIVACAINE HCL 2 MG/ML IJ SOLN
4.0000 mL | Freq: Once | INTRAMUSCULAR | Status: AC
  Administered 2018-01-05: 10 mL via INTRA_ARTICULAR

## 2018-01-05 MED ORDER — ROPIVACAINE HCL 2 MG/ML IJ SOLN
INTRAMUSCULAR | Status: AC
  Filled 2018-01-05: qty 10

## 2018-01-05 MED ORDER — LIDOCAINE HCL (PF) 1 % IJ SOLN
4.0000 mL | Freq: Once | INTRAMUSCULAR | Status: AC
  Administered 2018-01-05: 5 mL

## 2018-01-05 NOTE — Telephone Encounter (Signed)
Spoke with patient, said she stumbled over her feet and fell. No injuries.

## 2018-01-05 NOTE — Patient Instructions (Signed)

## 2018-01-05 NOTE — Progress Notes (Signed)
Patient's Name: Charlene [name]  MRN: [id number]  Referring Provider: Tracie Harrier, MD  DOB: 24-Nov-1960  PCP: Tracie Harrier, MD  DOS: 01/05/2018  Note by: Gaspar Cola, MD  Service setting: Ambulatory outpatient  Specialty: Interventional Pain Management  Patient type: Established  Location: [facility] (AMB) Pain Management Facility  Visit type: Interventional Procedure   Primary Reason for Visit: Interventional Pain Management Treatment. CC: Knee Pain  Procedure:          Anesthesia, Analgesia, Anxiolysis:  Type: Therapeutic Intra-Articular Hyalgan Knee Injection #2  Region: Lateral infrapatellar Knee Region Level: Knee Joint Laterality: Bilateral  Type: Local Anesthesia Indication(s): Analgesia         Local Anesthetic: Lidocaine 1-2% Route: Infiltration (Berger/IM) IV Access: Declined Sedation: Declined   Position: Sitting   Indications: 1. Osteoarthritis of knee (Bilateral)   2. Chronic knee pain (Fourth Area of Pain) (Bilateral) (L>R)    Pain Score: Pre-procedure: 1 /10 Post-procedure: 1 /10  Post-Procedure Assessment  12/22/2017 Procedure: Diagnostic left-sided L5-S1 interlaminar LESI #1 under fluoroscopic guidance and IV sedation Pre-procedure pain score:  2/10 Post-procedure pain score: 0/10 (100% relief) Influential Factors: BMI: 22.82 kg/m Intra-procedural challenges: None observed.         Assessment challenges: None detected.              Reported side-effects: None.        Post-procedural adverse reactions or complications: None reported         Sedation: Sedation provided. When no sedatives are used, the analgesic levels obtained are directly associated to the effectiveness of the local anesthetics. However, when sedation is provided, the level of analgesia obtained during the initial 1 hour following the intervention, is believed to be the result of a combination of factors. These factors may include, but are not limited to: 1. The effectiveness of the  local anesthetics used. 2. The effects of the analgesic(s) and/or anxiolytic(s) used. 3. The degree of discomfort experienced by the patient at the time of the procedure. 4. The patients ability and reliability in recalling and recording the events. 5. The presence and influence of possible secondary gains and/or psychosocial factors. Reported result: Relief experienced during the 1st hour after the procedure: 100 % (Ultra-Short Term Relief)            Interpretative annotation: Clinically appropriate result. Analgesia during this period is likely to be Local Anesthetic and/or IV Sedative (Analgesic/Anxiolytic) related.          Effects of local anesthetic: The analgesic effects attained during this period are directly associated to the localized infiltration of local anesthetics and therefore cary significant diagnostic value as to the etiological location, or anatomical origin, of the pain. Expected duration of relief is directly dependent on the pharmacodynamics of the local anesthetic used. Long-acting (4-6 hours) anesthetics used.  Reported result: Relief during the next 4 to 6 hour after the procedure: 100 % (Short-Term Relief)            Interpretative annotation: Clinically appropriate result. Analgesia during this period is likely to be Local Anesthetic-related.          Long-term benefit: Defined as the period of time past the expected duration of local anesthetics (1 hour for short-acting and 4-6 hours for long-acting). With the possible exception of prolonged sympathetic blockade from the local anesthetics, benefits during this period are typically attributed to, or associated with, other factors such as analgesic sensory neuropraxia, antiinflammatory effects, or beneficial biochemical changes provided by agents  other than the local anesthetics.  Reported result: Extended relief following procedure: 100 %(on going/ knee 100 for 8 days) (Long-Term Relief)            Interpretative  annotation: Clinically possible results. Good relief. No permanent benefit expected. Inflammation plays a part in the etiology to the pain.          Current benefits: Defined as reported results that persistent at this point in time.   Analgesia: 100 % Charlene [name] reports improvement of axial symptoms. Function: Charlene [name] reports improvement in function ROM: Charlene [name] reports improvement in ROM Interpretative annotation: Complete relief. Therapeutic success. Effective therapeutic approach. Benefit could be steroid-related.  Interpretation: Results would suggest a successful diagnostic intervention.                  Plan:  Proceed with treatment No.: 2 for the knees  Pre-op Assessment:  Charlene [name] is a 57 y.o. (year old), female patient, seen today for interventional treatment. She  has a past surgical history that includes arthroscopic rotator cuff; Colonoscopy; Abdominal hysterectomy; endosopic sinus; and Cataract extraction w/PHACO (Right, 11/25/2017). Charlene [name] has a current medication list which includes the following prescription(s): baclofen, gnp calcium 1200, vitamin d3, duloxetine, fluticasone, gabapentin, hydrocodone-acetaminophen, insulin aspart, insulin glargine, insulin glargine (2 unit dial), lidocaine, lidocaine-menthol, liraglutide, lisinopril, magnesium, meloxicam, metformin, metoprolol succinate, pantoprazole, polyethylene glycol, and tramadol. Her primarily concern today is the Knee Pain  Initial Vital Signs:  Pulse/HCG Rate: 87  Temp: 98.2 F (36.8 C) Resp:   BP: (!) 155/81 SpO2: 100 %  BMI: Estimated body mass index is 22.82 kg/m as calculated from the following:   Height as of this encounter: 4\' 11"  (1.499 m).   Weight as of this encounter: 113 lb (51.3 kg).  Risk Assessment: Allergies: Reviewed. She has No Known Allergies.  Allergy Precautions: None required Coagulopathies: Reviewed. None identified.  Blood-thinner therapy: None at this time Active  Infection(s): Reviewed. None identified. Charlene [name] is afebrile  Site Confirmation: Charlene [name] was asked to confirm the procedure and laterality before marking the site Procedure checklist: Completed Consent: Before the procedure and under the influence of no sedative(s), amnesic(s), or anxiolytics, the patient was informed of the treatment options, risks and possible complications. To fulfill our ethical and legal obligations, as recommended by the American Medical Association's Code of Ethics, I have informed the patient of my clinical impression; the nature and purpose of the treatment or procedure; the risks, benefits, and possible complications of the intervention; the alternatives, including doing nothing; the risk(s) and benefit(s) of the alternative treatment(s) or procedure(s); and the risk(s) and benefit(s) of doing nothing. The patient was provided information about the general risks and possible complications associated with the procedure. These may include, but are not limited to: failure to achieve desired goals, infection, bleeding, organ or nerve damage, allergic reactions, paralysis, and death. In addition, the patient was informed of those risks and complications associated to the procedure, such as failure to decrease pain; infection; bleeding; organ or nerve damage with subsequent damage to sensory, motor, and/or autonomic systems, resulting in permanent pain, numbness, and/or weakness of one or several areas of the body; allergic reactions; (i.e.: anaphylactic reaction); and/or death. Furthermore, the patient was informed of those risks and complications associated with the medications. These include, but are not limited to: allergic reactions (i.e.: anaphylactic or anaphylactoid reaction(s)); adrenal axis suppression; blood sugar elevation that in diabetics may result in ketoacidosis or comma; water retention that in  patients with history of congestive heart failure may result in  shortness of breath, pulmonary edema, and decompensation with resultant heart failure; weight gain; swelling or edema; medication-induced neural toxicity; particulate matter embolism and blood vessel occlusion with resultant organ, and/or nervous system infarction; and/or aseptic necrosis of one or more joints. Finally, the patient was informed that Medicine is not an exact science; therefore, there is also the possibility of unforeseen or unpredictable risks and/or possible complications that may result in a catastrophic outcome. The patient indicated having understood very clearly. We have given the patient no guarantees and we have made no promises. Enough time was given to the patient to ask questions, all of which were answered to the patient's satisfaction. Ms. Gamm has indicated that she wanted to continue with the procedure. Attestation: I, the ordering provider, attest that I have discussed with the patient the benefits, risks, side-effects, alternatives, likelihood of achieving goals, and potential problems during recovery for the procedure that I have provided informed consent. Date  Time: 01/05/2018  8:51 AM  Pre-Procedure Preparation:  Monitoring: As per clinic protocol. Respiration, ETCO2, SpO2, BP, heart rate and rhythm monitor placed and checked for adequate function Safety Precautions: Patient was assessed for positional comfort and pressure points before starting the procedure. Time-out: I initiated and conducted the "Time-out" before starting the procedure, as per protocol. The patient was asked to participate by confirming the accuracy of the "Time Out" information. Verification of the correct person, site, and procedure were performed and confirmed by me, the nursing staff, and the patient. "Time-out" conducted as per Joint Commission's Universal Protocol (UP.01.01.01). Time: 0911  Description of Procedure:          Target Area: Knee Joint Approach: Just above the Lateral tibial  plateau, lateral to the infrapatellar tendon. Area Prepped: Entire knee area, from the mid-thigh to the mid-shin. Prepping solution: ChloraPrep (2% chlorhexidine gluconate and 70% isopropyl alcohol) Safety Precautions: Aspiration looking for blood return was conducted prior to all injections. At no point did we inject any substances, as a needle was being advanced. No attempts were made at seeking any paresthesias. Safe injection practices and needle disposal techniques used. Medications properly checked for expiration dates. SDV (single dose vial) medications used. Description of the Procedure: Protocol guidelines were followed. The patient was placed in position over the fluoroscopy table. The target area was identified and the area prepped in the usual manner. Skin & deeper tissues infiltrated with local anesthetic. Appropriate amount of time allowed to pass for local anesthetics to take effect. The procedure needles were then advanced to the target area. Proper needle placement secured. Negative aspiration confirmed. Solution injected in intermittent fashion, asking for systemic symptoms every 0.5cc of injectate. The needles were then removed and the area cleansed, making sure to leave some of the prepping solution back to take advantage of its long term bactericidal properties. Vitals:   01/05/18 0849  BP: (!) 155/81  Pulse: 87  Temp: 98.2 F (36.8 C)  SpO2: 100%  Weight: 113 lb (51.3 kg)  Height: 4\' 11"  (1.499 m)    Start Time: 0911 hrs. End Time: 0912 hrs. Materials:  Needle(s) Type: Regular needle Gauge: 25G Length: 1.5-in Medication(s): Please see orders for medications and dosing details.  Imaging Guidance:          Type of Imaging Technique: None used Indication(s): N/A Exposure Time: No patient exposure Contrast: None used. Fluoroscopic Guidance: N/A Ultrasound Guidance: N/A Interpretation: N/A  Antibiotic Prophylaxis:   Anti-infectives (From  admission, onward)   None      Indication(s): None identified  Post-operative Assessment:  Post-procedure Vital Signs:  Pulse/HCG Rate: 87  Temp: 98.2 F (36.8 C) Resp:   BP: (!) 155/81 SpO2: 100 %  EBL: None  Complications: No immediate post-treatment complications observed by team, or reported by patient.  Note: The patient tolerated the entire procedure well. A repeat set of vitals were taken after the procedure and the patient was kept under observation following institutional policy, for this type of procedure. Post-procedural neurological assessment was performed, showing return to baseline, prior to discharge. The patient was provided with post-procedure discharge instructions, including a section on how to identify potential problems. Should any problems arise concerning this procedure, the patient was given instructions to immediately contact us, at any time, without hesitation. In any case, we plan to contact the patient by telephone for a follow-up status report regarding this interventional procedure.  Comments:  No additional relevant information.  Plan of Care   Imaging Orders  No imaging studies ordered today    Procedure Orders     KNEE INJECTION     KNEE INJECTION     KNEE INJECTION  Medications ordered for procedure: Meds ordered this encounter  Medications  . lidocaine (PF) (XYLOCAINE) 1 % injection 4 mL  . ropivacaine (PF) 2 mg/mL (0.2%) (NAROPIN) injection 4 mL  . Sodium Hyaluronate SOSY 2 mL  . Sodium Hyaluronate SOSY 2 mL   Medications administered: We administered lidocaine (PF), ropivacaine (PF) 2 mg/mL (0.2%), Sodium Hyaluronate, and Sodium Hyaluronate.  See the medical record for exact dosing, route, and time of administration.  Disposition: Discharge home  Discharge Date & Time: 01/05/2018; 0915 hrs.   Physician-requested Follow-up: Return for Procedure (no sedation): (B) Hyalgan #3.  Future Appointments  Date Time Provider [place]  01/28/2018  9:00 AM  Milinda Pointer, MD [facility] None  02/15/2018  8:30 AM Vevelyn Francois, NP [facility] None   Primary Care Physician: Tracie Harrier, MD Location: [facility] Note by: Gaspar Cola, MD Date: 01/05/2018; Time: 9:38 AM  Disclaimer:  Medicine is not an exact science. The only guarantee in medicine is that nothing is guaranteed. It is important to note that the decision to proceed with this intervention was based on the information collected from the patient. The Data and conclusions were drawn from the patient's questionnaire, the interview, and the physical examination. Because the information was provided in large part by the patient, it cannot be guaranteed that it has not been purposely or unconsciously manipulated. Every effort has been made to obtain as much relevant data as possible for this evaluation. It is important to note that the conclusions that lead to this procedure are derived in large part from the available data. Always take into account that the treatment will also be dependent on availability of resources and existing treatment guidelines, considered by other Pain Management Practitioners as being common knowledge and practice, at the time of the intervention. For Medico-Legal purposes, it is also important to point out that variation in procedural techniques and pharmacological choices are the acceptable norm. The indications, contraindications, technique, and results of the above procedure should only be interpreted and judged by a Board-Certified Interventional Pain Specialist with extensive familiarity and expertise in the same exact procedure and technique.

## 2018-01-05 NOTE — Telephone Encounter (Signed)
Patient went to [employer] after procedure and fell. She wanted to know if she should go to her therapy today. Patient called at 1:54 to ask about 2:00 therapy appt. As there was no way she would get a call back in time I suggested she ask her therapy people and if she kept hurting more she should go to ED

## 2018-01-06 ENCOUNTER — Telehealth: Payer: Self-pay

## 2018-01-06 NOTE — Telephone Encounter (Signed)
Pt was called and no problem was reported. 

## 2018-01-28 ENCOUNTER — Other Ambulatory Visit: Payer: Self-pay

## 2018-01-28 ENCOUNTER — Encounter: Payer: Self-pay | Admitting: Pain Medicine

## 2018-01-28 ENCOUNTER — Ambulatory Visit: Payer: BLUE CROSS/BLUE SHIELD | Attending: Pain Medicine | Admitting: Pain Medicine

## 2018-01-28 VITALS — BP 176/94 | HR 75 | Temp 97.8°F | Resp 18 | Ht 59.0 in | Wt 114.0 lb

## 2018-01-28 DIAGNOSIS — M25562 Pain in left knee: Secondary | ICD-10-CM

## 2018-01-28 DIAGNOSIS — M170 Bilateral primary osteoarthritis of knee: Secondary | ICD-10-CM | POA: Insufficient documentation

## 2018-01-28 DIAGNOSIS — M25561 Pain in right knee: Secondary | ICD-10-CM

## 2018-01-28 DIAGNOSIS — G8929 Other chronic pain: Secondary | ICD-10-CM | POA: Insufficient documentation

## 2018-01-28 MED ORDER — LIDOCAINE HCL (PF) 1 % IJ SOLN
4.0000 mL | Freq: Once | INTRAMUSCULAR | Status: DC
  Administered 2018-01-28: 4 mL
  Filled 2018-01-28: qty 5

## 2018-01-28 MED ORDER — SODIUM HYALURONATE (VISCOSUP) 20 MG/2ML IX SOSY
2.0000 mL | PREFILLED_SYRINGE | Freq: Once | INTRA_ARTICULAR | Status: DC
  Administered 2018-01-28: 2 mL via INTRA_ARTICULAR

## 2018-01-28 MED ORDER — ROPIVACAINE HCL 2 MG/ML IJ SOLN
4.0000 mL | Freq: Once | INTRAMUSCULAR | Status: DC
  Administered 2018-01-28: 4 mL via INTRA_ARTICULAR

## 2018-01-28 NOTE — Patient Instructions (Signed)
____________________________________________________________________________________________  Post-Procedure Discharge Instructions  Instructions:  Apply ice: Fill a plastic sandwich bag with crushed ice. Cover it with a small towel and apply to injection site. Apply for 15 minutes then remove x 15 minutes. Repeat sequence on day of procedure, until you go to bed. The purpose is to minimize swelling and discomfort after procedure.  Apply heat: Apply heat to procedure site starting the day following the procedure. The purpose is to treat any soreness and discomfort from the procedure.  Food intake: Start with clear liquids (like water) and advance to regular food, as tolerated.   Physical activities: Keep activities to a minimum for the first 8 hours after the procedure.   Driving: If you have received any sedation, you are not allowed to drive for 24 hours after your procedure.  Blood thinner: Restart your blood thinner 6 hours after your procedure. (Only for those taking blood thinners)  Insulin: As soon as you can eat, you may resume your normal dosing schedule. (Only for those taking insulin)  Infection prevention: Keep procedure site clean and dry.  Post-procedure Pain Diary: Extremely important that this be done correctly and accurately. Recorded information will be used to determine the next step in treatment.  Pain evaluated is that of treated area only. Do not include pain from an untreated area.  Complete every hour, on the hour, for the initial 8 hours. Set an alarm to help you do this part accurately.  Do not go to sleep and have it completed later. It will not be accurate.  Follow-up appointment: Keep your follow-up appointment after the procedure. Usually 2 weeks for most procedures. (6 weeks in the case of radiofrequency.) Bring you pain diary.   Expect:  From numbing medicine (AKA: Local Anesthetics): Numbness or decrease in pain.  Onset: Full effect within 15  minutes of injected.  Duration: It will depend on the type of local anesthetic used. On the average, 1 to 8 hours.   From steroids: Decrease in swelling or inflammation. Once inflammation is improved, relief of the pain will follow.  Onset of benefits: Depends on the amount of swelling present. The more swelling, the longer it will take for the benefits to be seen. In some cases, up to 10 days.  Duration: Steroids will stay in the system x 2 weeks. Duration of benefits will depend on multiple posibilities including persistent irritating factors.  Occasional side-effects: Facial flushing (red, warm cheeks) , cramps (if present, drink Gatorade and take over-the-counter Magnesium 450-500 mg once to twice a day).  From procedure: Some discomfort is to be expected once the numbing medicine wears off. This should be minimal if ice and heat are applied as instructed.  Call if:  You experience numbness and weakness that gets worse with time, as opposed to wearing off.  New onset bowel or bladder incontinence. (This applies to Spinal procedures only)  Emergency Numbers:  Durning business hours (Monday - Thursday, 8:00 AM - 4:00 PM) (Friday, 9:00 AM - 12:00 Noon): [phone]  After hours: [phone] ____________________________________________________________________________________________   ____________________________________________________________________________________________  Preparing for your procedure (without sedation)  Instructions: . Oral Intake: Do not eat or drink anything for at least 3 hours prior to your procedure. . Transportation: Unless otherwise stated by your physician, you may drive yourself after the procedure. . Blood Pressure Medicine: Take your blood pressure medicine with a sip of water the morning of the procedure. . Blood thinners: Notify our staff if you are taking any blood  thinners. Depending on which one you take, there will be specific  instructions on how and when to stop it. . Diabetics on insulin: Notify the staff so that you can be scheduled 1st case in the morning. If your diabetes requires high dose insulin, take only  of your normal insulin dose the morning of the procedure and notify the staff that you have done so. . Preventing infections: Shower with an antibacterial soap the morning of your procedure.  . Build-up your immune system: Take 1000 mg of Vitamin C with every meal (3 times a day) the day prior to your procedure. Marland Kitchen Antibiotics: Inform the staff if you have a condition or reason that requires you to take antibiotics before dental procedures. . Pregnancy: If you are pregnant, call and cancel the procedure. . Sickness: If you have a cold, fever, or any active infections, call and cancel the procedure. . Arrival: You must be in the facility at least 30 minutes prior to your scheduled procedure. . Children: Do not bring any children with you. . Dress appropriately: Bring dark clothing that you would not mind if they get stained. . Valuables: Do not bring any jewelry or valuables.  Procedure appointments are reserved for interventional treatments only. Marland Kitchen No Prescription Refills. . No medication changes will be discussed during procedure appointments. . No disability issues will be discussed.  Reasons to call and reschedule or cancel your procedure: (Following these recommendations will minimize the risk of a serious complication.) . Surgeries: Avoid having procedures within 2 weeks of any surgery. (Avoid for 2 weeks before or after any surgery). . Flu Shots: Avoid having procedures within 2 weeks of a flu shots or . (Avoid for 2 weeks before or after immunizations). . Barium: Avoid having a procedure within 7-10 days after having had a radiological study involving the use of radiological contrast. (Myelograms, Barium swallow or enema study). . Heart attacks: Avoid any elective procedures or surgeries for the  initial 6 months after a "Myocardial Infarction" (Heart Attack). . Blood thinners: It is imperative that you stop these medications before procedures. Let us know if you if you take any blood thinner.  . Infection: Avoid procedures during or within two weeks of an infection (including chest colds or gastrointestinal problems). Symptoms associated with infections include: Localized redness, fever, chills, night sweats or profuse sweating, burning sensation when voiding, cough, congestion, stuffiness, runny nose, sore throat, diarrhea, nausea, vomiting, cold or Flu symptoms, recent or current infections. It is specially important if the infection is over the area that we intend to treat. Marland Kitchen Heart and lung problems: Symptoms that may suggest an active cardiopulmonary problem include: cough, chest pain, breathing difficulties or shortness of breath, dizziness, ankle swelling, uncontrolled high or unusually low blood pressure, and/or palpitations. If you are experiencing any of these symptoms, cancel your procedure and contact your primary care physician for an evaluation.  Remember:  Regular Business hours are:  Monday to Thursday 8:00 AM to 4:00 PM  Provider's Schedule: Milinda Pointer, MD:  Procedure days: Tuesday and Thursday 7:30 AM to 4:00 PM  Gillis Santa, MD:  Procedure days: Monday and Wednesday 7:30 AM to 4:00 PM ____________________________________________________________________________________________

## 2018-01-28 NOTE — Progress Notes (Signed)
Safety precautions to be maintained throughout the outpatient stay will include: orient to surroundings, keep bed in low position, maintain call bell within reach at all times, provide assistance with transfer out of bed and ambulation.  

## 2018-01-28 NOTE — Progress Notes (Signed)
Patient's Name: Charlene [name]  MRN: [id number]  Referring Provider: Tracie Harrier, MD  DOB: 04-12-1960  PCP: Tracie Harrier, MD  DOS: 01/28/2018  Note by: Gaspar Cola, MD  Service setting: Ambulatory outpatient  Specialty: Interventional Pain Management  Patient type: Established  Location: [facility] (AMB) Pain Management Facility  Visit type: Interventional Procedure   Primary Reason for Visit: Interventional Pain Management Treatment. CC: No chief complaint on file.  Procedure:          Anesthesia, Analgesia, Anxiolysis:  Type: Therapeutic Intra-Articular Hyalgan Knee Injection #3  Region: Lateral infrapatellar Knee Region Level: Knee Joint Laterality: Bilateral  Type: Local Anesthesia Indication(s): Analgesia         Local Anesthetic: Lidocaine 1-2% Route: Infiltration ([place]/IM) IV Access: Declined Sedation: Declined   Position: Sitting   Indications: 1. Chronic knee pain (Fourth Area of Pain) (Bilateral) (L>R)   2. Osteoarthritis of knee (Bilateral)    Pain Score: Pre-procedure: 1 /10 Post-procedure: 0-No pain/10  Pre-op Assessment:  Charlene [name] is a 58 y.o. (year old), female patient, seen today for interventional treatment. She  has a past surgical history that includes arthroscopic rotator cuff; Colonoscopy; Abdominal hysterectomy; endosopic sinus; and Cataract extraction w/PHACO (Right, 11/25/2017). Charlene [name] has a current medication list which includes the following prescription(s): baclofen, gnp calcium 1200, vitamin d3, duloxetine, fluticasone, gabapentin, hydrocodone-acetaminophen, insulin aspart, insulin glargine, insulin glargine (2 unit dial), lidocaine, lidocaine-menthol, liraglutide, lisinopril, magnesium, meloxicam, metoprolol succinate, pantoprazole, polyethylene glycol, tramadol, vitamin d (ergocalciferol), and metformin, and the following Facility-Administered Medications: lidocaine (pf), ropivacaine (pf) 2 mg/ml (0.2%), sodium hyaluronate, and sodium  hyaluronate. Her primarily concern today is the No chief complaint on file.  Initial Vital Signs:  Pulse/HCG Rate: 81  Temp: 97.8 F (36.6 C) Resp: 18 BP: (!) 164/84 SpO2: 100 %  BMI: Estimated body mass index is 23.03 kg/m as calculated from the following:   Height as of this encounter: 4\' 11"  (1.499 m).   Weight as of this encounter: 114 lb (51.7 kg).  Risk Assessment: Allergies: Reviewed. She has No Known Allergies.  Allergy Precautions: None required Coagulopathies: Reviewed. None identified.  Blood-thinner therapy: None at this time Active Infection(s): Reviewed. None identified. Charlene [name] is afebrile  Site Confirmation: Charlene [name] was asked to confirm the procedure and laterality before marking the site Procedure checklist: Completed Consent: Before the procedure and under the influence of no sedative(s), amnesic(s), or anxiolytics, the patient was informed of the treatment options, risks and possible complications. To fulfill our ethical and legal obligations, as recommended by the American Medical Association's Code of Ethics, I have informed the patient of my clinical impression; the nature and purpose of the treatment or procedure; the risks, benefits, and possible complications of the intervention; the alternatives, including doing nothing; the risk(s) and benefit(s) of the alternative treatment(s) or procedure(s); and the risk(s) and benefit(s) of doing nothing. The patient was provided information about the general risks and possible complications associated with the procedure. These may include, but are not limited to: failure to achieve desired goals, infection, bleeding, organ or nerve damage, allergic reactions, paralysis, and death. In addition, the patient was informed of those risks and complications associated to the procedure, such as failure to decrease pain; infection; bleeding; organ or nerve damage with subsequent damage to sensory, motor, and/or autonomic  systems, resulting in permanent pain, numbness, and/or weakness of one or several areas of the body; allergic reactions; (i.e.: anaphylactic reaction); and/or death. Furthermore, the patient was informed of those risks  and complications associated with the medications. These include, but are not limited to: allergic reactions (i.e.: anaphylactic or anaphylactoid reaction(s)); adrenal axis suppression; blood sugar elevation that in diabetics may result in ketoacidosis or comma; water retention that in patients with history of congestive heart failure may result in shortness of breath, pulmonary edema, and decompensation with resultant heart failure; weight gain; swelling or edema; medication-induced neural toxicity; particulate matter embolism and blood vessel occlusion with resultant organ, and/or nervous system infarction; and/or aseptic necrosis of one or more joints. Finally, the patient was informed that Medicine is not an exact science; therefore, there is also the possibility of unforeseen or unpredictable risks and/or possible complications that may result in a catastrophic outcome. The patient indicated having understood very clearly. We have given the patient no guarantees and we have made no promises. Enough time was given to the patient to ask questions, all of which were answered to the patient's satisfaction. Charlene [name] has indicated that she wanted to continue with the procedure. Attestation: I, the ordering provider, attest that I have discussed with the patient the benefits, risks, side-effects, alternatives, likelihood of achieving goals, and potential problems during recovery for the procedure that I have provided informed consent. Date  Time: 01/28/2018  9:16 AM  Pre-Procedure Preparation:  Monitoring: As per clinic protocol. Respiration, ETCO2, SpO2, BP, heart rate and rhythm monitor placed and checked for adequate function Safety Precautions: Patient was assessed for positional comfort  and pressure points before starting the procedure. Time-out: I initiated and conducted the "Time-out" before starting the procedure, as per protocol. The patient was asked to participate by confirming the accuracy of the "Time Out" information. Verification of the correct person, site, and procedure were performed and confirmed by me, the nursing staff, and the patient. "Time-out" conducted as per Joint Commission's Universal Protocol (UP.01.01.01). Time: 0934  Description of Procedure:          Target Area: Knee Joint Approach: Just above the Lateral tibial plateau, lateral to the infrapatellar tendon. Area Prepped: Entire knee area, from the mid-thigh to the mid-shin. Prepping solution: ChloraPrep (2% chlorhexidine gluconate and 70% isopropyl alcohol) Safety Precautions: Aspiration looking for blood return was conducted prior to all injections. At no point did we inject any substances, as a needle was being advanced. No attempts were made at seeking any paresthesias. Safe injection practices and needle disposal techniques used. Medications properly checked for expiration dates. SDV (single dose vial) medications used. Description of the Procedure: Protocol guidelines were followed. The patient was placed in position over the fluoroscopy table. The target area was identified and the area prepped in the usual manner. Skin & deeper tissues infiltrated with local anesthetic. Appropriate amount of time allowed to pass for local anesthetics to take effect. The procedure needles were then advanced to the target area. Proper needle placement secured. Negative aspiration confirmed. Solution injected in intermittent fashion, asking for systemic symptoms every 0.5cc of injectate. The needles were then removed and the area cleansed, making sure to leave some of the prepping solution back to take advantage of its long term bactericidal properties. Vitals:   01/28/18 0916 01/28/18 0939  BP: (!) 164/84 (!) 176/94   Pulse: 81 75  Resp: 18   Temp: 97.8 F (36.6 C)   SpO2: 100%   Weight: 114 lb (51.7 kg)   Height: 4\' 11"  (1.499 m)     Start Time: 0935 hrs. End Time: 0938 hrs. Materials:  Needle(s) Type: Regular needle Gauge: 25G Length: 1.5-in  Medication(s): Please see orders for medications and dosing details.  Imaging Guidance:          Type of Imaging Technique: None used Indication(s): N/A Exposure Time: No patient exposure Contrast: None used. Fluoroscopic Guidance: N/A Ultrasound Guidance: N/A Interpretation: N/A  Antibiotic Prophylaxis:   Anti-infectives (From admission, onward)   None     Indication(s): None identified  Post-operative Assessment:  Post-procedure Vital Signs:  Pulse/HCG Rate: 75  Temp: 97.8 F (36.6 C) Resp: 18 BP: (!) 176/94 SpO2: 100 %  EBL: None  Complications: No immediate post-treatment complications observed by team, or reported by patient.  Note: The patient tolerated the entire procedure well. A repeat set of vitals were taken after the procedure and the patient was kept under observation following institutional policy, for this type of procedure. Post-procedural neurological assessment was performed, showing return to baseline, prior to discharge. The patient was provided with post-procedure discharge instructions, including a section on how to identify potential problems. Should any problems arise concerning this procedure, the patient was given instructions to immediately contact us, at any time, without hesitation. In any case, we plan to contact the patient by telephone for a follow-up status report regarding this interventional procedure.  Comments:  No additional relevant information.  Plan of Care   Imaging Orders  No imaging studies ordered today    Procedure Orders     KNEE INJECTION     KNEE INJECTION  Medications ordered for procedure: Meds ordered this encounter  Medications  . lidocaine (PF) (XYLOCAINE) 1 % injection 4  mL  . ropivacaine (PF) 2 mg/mL (0.2%) (NAROPIN) injection 4 mL  . Sodium Hyaluronate SOSY 2 mL  . Sodium Hyaluronate SOSY 2 mL   Medications administered: Izabell L. Menger had no medications administered during this visit.  See the medical record for exact dosing, route, and time of administration.  Disposition: Discharge home  Discharge Date & Time: 01/28/2018; 0942 hrs.   Physician-requested Follow-up: Return in about 2 weeks (around 02/11/2018) for Procedure (no sedation): (B) Hyalgan #4.  Future Appointments  Date Time Provider [place]  02/11/2018 10:15 AM Milinda Pointer, MD [facility] None  02/15/2018  8:30 AM Vevelyn Francois, NP [facility] None   Primary Care Physician: Tracie Harrier, MD Location: [facility] Note by: Gaspar Cola, MD Date: 01/28/2018; Time: 10:03 AM  Disclaimer:  Medicine is not an exact science. The only guarantee in medicine is that nothing is guaranteed. It is important to note that the decision to proceed with this intervention was based on the information collected from the patient. The Data and conclusions were drawn from the patient's questionnaire, the interview, and the physical examination. Because the information was provided in large part by the patient, it cannot be guaranteed that it has not been purposely or unconsciously manipulated. Every effort has been made to obtain as much relevant data as possible for this evaluation. It is important to note that the conclusions that lead to this procedure are derived in large part from the available data. Always take into account that the treatment will also be dependent on availability of resources and existing treatment guidelines, considered by other Pain Management Practitioners as being common knowledge and practice, at the time of the intervention. For Medico-Legal purposes, it is also important to point out that variation in procedural techniques and  pharmacological choices are the acceptable norm. The indications, contraindications, technique, and results of the above procedure should only be interpreted and judged  by a Board-Certified Interventional Pain Specialist with extensive familiarity and expertise in the same exact procedure and technique.

## 2018-01-29 ENCOUNTER — Telehealth: Payer: Self-pay

## 2018-01-29 NOTE — Telephone Encounter (Signed)
Post procedure phone call. Patient states he is doing well.  

## 2018-02-03 ENCOUNTER — Other Ambulatory Visit: Payer: Self-pay | Admitting: Neurology

## 2018-02-03 DIAGNOSIS — M79605 Pain in left leg: Secondary | ICD-10-CM

## 2018-02-03 DIAGNOSIS — M7989 Other specified soft tissue disorders: Secondary | ICD-10-CM

## 2018-02-05 ENCOUNTER — Ambulatory Visit
Admission: RE | Admit: 2018-02-05 | Discharge: 2018-02-05 | Disposition: A | Discharge status: Home / Self Care | Payer: BLUE CROSS/BLUE SHIELD | Source: Ambulatory Visit | Attending: Neurology | Admitting: Neurology

## 2018-02-05 DIAGNOSIS — M79605 Pain in left leg: Secondary | ICD-10-CM | POA: Insufficient documentation

## 2018-02-05 DIAGNOSIS — M7989 Other specified soft tissue disorders: Secondary | ICD-10-CM | POA: Insufficient documentation

## 2018-02-11 ENCOUNTER — Ambulatory Visit: Payer: BLUE CROSS/BLUE SHIELD | Attending: Pain Medicine | Admitting: Pain Medicine

## 2018-02-11 ENCOUNTER — Encounter: Payer: Self-pay | Admitting: Pain Medicine

## 2018-02-11 ENCOUNTER — Other Ambulatory Visit: Payer: Self-pay

## 2018-02-11 VITALS — BP 202/105 | HR 77 | Temp 97.5°F | Resp 16 | Ht 59.0 in | Wt 114.0 lb

## 2018-02-11 DIAGNOSIS — M5442 Lumbago with sciatica, left side: Secondary | ICD-10-CM | POA: Diagnosis present

## 2018-02-11 DIAGNOSIS — G8929 Other chronic pain: Secondary | ICD-10-CM | POA: Diagnosis present

## 2018-02-11 DIAGNOSIS — M79605 Pain in left leg: Secondary | ICD-10-CM | POA: Insufficient documentation

## 2018-02-11 DIAGNOSIS — M79604 Pain in right leg: Secondary | ICD-10-CM | POA: Diagnosis present

## 2018-02-11 DIAGNOSIS — M170 Bilateral primary osteoarthritis of knee: Secondary | ICD-10-CM | POA: Insufficient documentation

## 2018-02-11 DIAGNOSIS — M25561 Pain in right knee: Secondary | ICD-10-CM | POA: Diagnosis present

## 2018-02-11 DIAGNOSIS — M5136 Other intervertebral disc degeneration, lumbar region: Secondary | ICD-10-CM | POA: Insufficient documentation

## 2018-02-11 DIAGNOSIS — M25562 Pain in left knee: Secondary | ICD-10-CM | POA: Diagnosis present

## 2018-02-11 MED ORDER — SODIUM HYALURONATE (VISCOSUP) 20 MG/2ML IX SOSY
2.0000 mL | PREFILLED_SYRINGE | Freq: Once | INTRA_ARTICULAR | Status: AC
  Administered 2018-02-11: 2 mL via INTRA_ARTICULAR

## 2018-02-11 MED ORDER — LIDOCAINE HCL (PF) 1 % IJ SOLN
INTRAMUSCULAR | Status: AC
  Filled 2018-02-11: qty 5

## 2018-02-11 MED ORDER — LIDOCAINE HCL (PF) 1 % IJ SOLN
4.0000 mL | Freq: Once | INTRAMUSCULAR | Status: AC
  Administered 2018-02-11: 4 mL

## 2018-02-11 MED ORDER — ROPIVACAINE HCL 2 MG/ML IJ SOLN
4.0000 mL | Freq: Once | INTRAMUSCULAR | Status: AC
  Administered 2018-02-11: 4 mL via INTRA_ARTICULAR

## 2018-02-11 MED ORDER — ROPIVACAINE HCL 2 MG/ML IJ SOLN
INTRAMUSCULAR | Status: AC
  Filled 2018-02-11: qty 10

## 2018-02-11 NOTE — Patient Instructions (Addendum)
Epidural Steroid Injection Patient Information  Description: The epidural space surrounds the nerves as they exit the spinal cord.  In some patients, the nerves can be compressed and inflamed by a bulging disc or a tight spinal canal (spinal stenosis).  By injecting steroids into the epidural space, we can bring irritated nerves into direct contact with a potentially helpful medication.  These steroids act directly on the irritated nerves and can reduce swelling and inflammation which often leads to decreased pain.  Epidural steroids may be injected anywhere along the spine and from the neck to the low back depending upon the location of your pain.   After numbing the skin with local anesthetic (like Novocaine), a small needle is passed into the epidural space slowly.  You may experience a sensation of pressure while this is being done.  The entire block usually last less than 10 minutes.  Conditions which may be treated by epidural steroids:   Low back and leg pain  Neck and arm pain  Spinal stenosis  Post-laminectomy syndrome  Herpes zoster (shingles) pain  Pain from compression fractures  Preparation for the injection:  1. Do not eat any solid food or dairy products within 8 hours of your appointment.  2. You may drink clear liquids up to 3 hours before appointment.  Clear liquids include water, black coffee, juice or soda.  No milk or cream please. 3. You may take your regular medication, including pain medications, with a sip of water before your appointment  Diabetics should hold regular insulin (if taken separately) and take 1/2 normal NPH dos the morning of the procedure.  Carry some sugar containing items with you to your appointment. 4. A driver must accompany you and be prepared to drive you home after your procedure.  5. Bring all your current medications with your. 6. An IV may be inserted and sedation may be given at the discretion of the physician.   7. A blood pressure  cuff, EKG and other monitors will often be applied during the procedure.  Some patients may need to have extra oxygen administered for a short period. 8. You will be asked to provide medical information, including your allergies, prior to the procedure.  We must know immediately if you are taking blood thinners (like Coumadin/Warfarin)  Or if you are allergic to IV iodine contrast (dye). We must know if you could possible be pregnant.  Possible side-effects:  Bleeding from needle site  Infection (rare, may require surgery)  Nerve injury (rare)  Numbness & tingling (temporary)  Difficulty urinating (rare, temporary)  Spinal headache ( a headache worse with upright posture)  Light -headedness (temporary)  Pain at injection site (several days)  Decreased blood pressure (temporary)  Weakness in arm/leg (temporary)  Pressure sensation in back/neck (temporary)  Call if you experience:  Fever/chills associated with headache or increased back/neck pain.  Headache worsened by an upright position.  New onset weakness or numbness of an extremity below the injection site  Hives or difficulty breathing (go to the emergency room)  Inflammation or drainage at the infection site  Severe back/neck pain  Any new symptoms which are concerning to you  Please note:  Although the local anesthetic injected can often make your back or neck feel good for several hours after the injection, the pain will likely return.  It takes 3-7 days for steroids to work in the epidural space.  You may not notice any pain relief for at least that one week.    If effective, we will often do a series of three injections spaced 3-6 weeks apart to maximally decrease your pain.  After the initial series, we generally will wait several months before considering a repeat injection of the same type.  If you have any questions, please call [phone] [facility]____________________________________________________________________________________________  Post-Procedure Discharge Instructions  Instructions:  Apply ice: Fill a plastic sandwich bag with crushed ice. Cover it with a small towel and apply to injection site. Apply for 15 minutes then remove x 15 minutes. Repeat sequence on day of procedure, until you go to bed. The purpose is to minimize swelling and discomfort after procedure.  Apply heat: Apply heat to procedure site starting the day following the procedure. The purpose is to treat any soreness and discomfort from the procedure.  Food intake: Start with clear liquids (like water) and advance to regular food, as tolerated.   Physical activities: Keep activities to a minimum for the first 8 hours after the procedure.   Driving: If you have received any sedation, you are not allowed to drive for 24 hours after your procedure.  Blood thinner: Restart your blood thinner 6 hours after your procedure. (Only for those taking blood thinners)  Insulin: As soon as you can eat, you may resume your normal dosing schedule. (Only for those taking insulin)  Infection prevention: Keep procedure site clean and dry.  Post-procedure Pain Diary: Extremely important that this be done correctly and accurately. Recorded information will be used to determine the next step in treatment.  Pain evaluated is that of treated area only. Do not include pain from an untreated area.  Complete every hour, on the hour, for the initial 8 hours. Set an alarm to help you do this part accurately.  Do not go to sleep and have it completed later. It will not be accurate.  Follow-up appointment: Keep your follow-up appointment after the procedure. Usually 2 weeks for most procedures. (6 weeks in the case of radiofrequency.) Bring you pain diary.   Expect:  From numbing medicine (AKA: Local Anesthetics): Numbness or decrease in pain.  Onset: Full effect within 15  minutes of injected.  Duration: It will depend on the type of local anesthetic used. On the average, 1 to 8 hours.   From steroids: Decrease in swelling or inflammation. Once inflammation is improved, relief of the pain will follow.  Onset of benefits: Depends on the amount of swelling present. The more swelling, the longer it will take for the benefits to be seen. In some cases, up to 10 days.  Duration: Steroids will stay in the system x 2 weeks. Duration of benefits will depend on multiple posibilities including persistent irritating factors.  Occasional side-effects: Facial flushing (red, warm cheeks) , cramps (if present, drink Gatorade and take over-the-counter Magnesium 450-500 mg once to twice a day).  From procedure: Some discomfort is to be expected once the numbing medicine wears off. This should be minimal if ice and heat are applied as instructed.  Call if:  You experience numbness and weakness that gets worse with time, as opposed to wearing off.  New onset bowel or bladder incontinence. (This applies to Spinal procedures only)  Emergency Numbers:  Durning business hours (Monday - Thursday, 8:00 AM - 4:00 PM) (Friday, 9:00 AM - 12:00 Noon): [phone]  After hours: [phone] ____________________________________________________________________________________________   ____________________________________________________________________________________________  Preparing for your procedure (without sedation)  Instructions: . Oral Intake: Do not eat or drink  anything for at least 3 hours prior to your procedure. . Transportation: Unless otherwise stated by your physician, you may drive yourself after the procedure. . Blood Pressure Medicine: Take your blood pressure medicine with a sip of water the morning of the procedure. . Blood thinners: Notify our staff if you are taking any blood thinners. Depending on which one you take, there will be specific  instructions on how and when to stop it. . Diabetics on insulin: Notify the staff so that you can be scheduled 1st case in the morning. If your diabetes requires high dose insulin, take only  of your normal insulin dose the morning of the procedure and notify the staff that you have done so. . Preventing infections: Shower with an antibacterial soap the morning of your procedure.  . Build-up your immune system: Take 1000 mg of Vitamin C with every meal (3 times a day) the day prior to your procedure. Marland Kitchen Antibiotics: Inform the staff if you have a condition or reason that requires you to take antibiotics before dental procedures. . Pregnancy: If you are pregnant, call and cancel the procedure. . Sickness: If you have a cold, fever, or any active infections, call and cancel the procedure. . Arrival: You must be in the facility at least 30 minutes prior to your scheduled procedure. . Children: Do not bring any children with you. . Dress appropriately: Bring dark clothing that you would not mind if they get stained. . Valuables: Do not bring any jewelry or valuables.  Procedure appointments are reserved for interventional treatments only. Marland Kitchen No Prescription Refills. . No medication changes will be discussed during procedure appointments. . No disability issues will be discussed.  Reasons to call and reschedule or cancel your procedure: (Following these recommendations will minimize the risk of a serious complication.) . Surgeries: Avoid having procedures within 2 weeks of any surgery. (Avoid for 2 weeks before or after any surgery). . Flu Shots: Avoid having procedures within 2 weeks of a flu shots or . (Avoid for 2 weeks before or after immunizations). . Barium: Avoid having a procedure within 7-10 days after having had a radiological study involving the use of radiological contrast. (Myelograms, Barium swallow or enema study). . Heart attacks: Avoid any elective procedures or surgeries for the  initial 6 months after a "Myocardial Infarction" (Heart Attack). . Blood thinners: It is imperative that you stop these medications before procedures. Let us know if you if you take any blood thinner.  . Infection: Avoid procedures during or within two weeks of an infection (including chest colds or gastrointestinal problems). Symptoms associated with infections include: Localized redness, fever, chills, night sweats or profuse sweating, burning sensation when voiding, cough, congestion, stuffiness, runny nose, sore throat, diarrhea, nausea, vomiting, cold or Flu symptoms, recent or current infections. It is specially important if the infection is over the area that we intend to treat. Marland Kitchen Heart and lung problems: Symptoms that may suggest an active cardiopulmonary problem include: cough, chest pain, breathing difficulties or shortness of breath, dizziness, ankle swelling, uncontrolled high or unusually low blood pressure, and/or palpitations. If you are experiencing any of these symptoms, cancel your procedure and contact your primary care physician for an evaluation.  Remember:  Regular Business hours are:  Monday to Thursday 8:00 AM to 4:00 PM  Provider's Schedule: Milinda Pointer, MD:  Procedure days: Tuesday and Thursday 7:30 AM to 4:00 PM  Gillis Santa, MD:  Procedure days: Monday and Wednesday 7:30 AM to 4:00 PM  ____________________________________________________________________________________________   ____________________________________________________________________________________________  Preparing for Procedure with Sedation  Instructions: . Oral Intake: Do not eat or drink anything for at least 8 hours prior to your procedure. . Transportation: Public transportation is not allowed. Bring an adult driver. The driver must be physically present in our waiting room before any procedure can be started. Marland Kitchen Physical Assistance: Bring an adult physically capable of assisting you, in  the event you need help. This adult should keep you company at home for at least 6 hours after the procedure. . Blood Pressure Medicine: Take your blood pressure medicine with a sip of water the morning of the procedure. . Blood thinners: Notify our staff if you are taking any blood thinners. Depending on which one you take, there will be specific instructions on how and when to stop it. . Diabetics on insulin: Notify the staff so that you can be scheduled 1st case in the morning. If your diabetes requires high dose insulin, take only  of your normal insulin dose the morning of the procedure and notify the staff that you have done so. . Preventing infections: Shower with an antibacterial soap the morning of your procedure. . Build-up your immune system: Take 1000 mg of Vitamin C with every meal (3 times a day) the day prior to your procedure. Marland Kitchen Antibiotics: Inform the staff if you have a condition or reason that requires you to take antibiotics before dental procedures. . Pregnancy: If you are pregnant, call and cancel the procedure. . Sickness: If you have a cold, fever, or any active infections, call and cancel the procedure. . Arrival: You must be in the facility at least 30 minutes prior to your scheduled procedure. . Children: Do not bring children with you. . Dress appropriately: Bring dark clothing that you would not mind if they get stained. . Valuables: Do not bring any jewelry or valuables.  Procedure appointments are reserved for interventional treatments only. Marland Kitchen No Prescription Refills. . No medication changes will be discussed during procedure appointments. . No disability issues will be discussed.  Reasons to call and reschedule or cancel your procedure: (Following these recommendations will minimize the risk of a serious complication.) . Surgeries: Avoid having procedures within 2 weeks of any surgery. (Avoid for 2 weeks before or after any surgery). . Flu Shots: Avoid having  procedures within 2 weeks of a flu shots or . (Avoid for 2 weeks before or after immunizations). . Barium: Avoid having a procedure within 7-10 days after having had a radiological study involving the use of radiological contrast. (Myelograms, Barium swallow or enema study). . Heart attacks: Avoid any elective procedures or surgeries for the initial 6 months after a "Myocardial Infarction" (Heart Attack). . Blood thinners: It is imperative that you stop these medications before procedures. Let us know if you if you take any blood thinner.  . Infection: Avoid procedures during or within two weeks of an infection (including chest colds or gastrointestinal problems). Symptoms associated with infections include: Localized redness, fever, chills, night sweats or profuse sweating, burning sensation when voiding, cough, congestion, stuffiness, runny nose, sore throat, diarrhea, nausea, vomiting, cold or Flu symptoms, recent or current infections. It is specially important if the infection is over the area that we intend to treat. Marland Kitchen Heart and lung problems: Symptoms that may suggest an active cardiopulmonary problem include: cough, chest pain, breathing difficulties or shortness of breath, dizziness, ankle swelling, uncontrolled high or unusually low blood pressure, and/or palpitations. If you are experiencing any  of these symptoms, cancel your procedure and contact your primary care physician for an evaluation.  Remember:  Regular Business hours are:  Monday to Thursday 8:00 AM to 4:00 PM  Provider's Schedule: Milinda Pointer, MD:  Procedure days: Tuesday and Thursday 7:30 AM to 4:00 PM  Gillis Santa, MD:  Procedure days: Monday and Wednesday 7:30 AM to 4:00 PM ____________________________________________________________________________________________

## 2018-02-11 NOTE — Progress Notes (Signed)
Safety precautions to be maintained throughout the outpatient stay will include: orient to surroundings, keep bed in low position, maintain call bell within reach at all times, provide assistance with transfer out of bed and ambulation.  

## 2018-02-11 NOTE — Addendum Note (Signed)
Addended by: Milinda Pointer A on: 02/11/2018 12:36 PM   Modules accepted: Orders

## 2018-02-11 NOTE — Progress Notes (Addendum)
Patient's Name: Charlene [name]  MRN: [id number]  Referring Provider: Tracie Harrier, MD  DOB: 1960/10/06  PCP: Tracie Harrier, MD  DOS: 02/11/2018  Note by: Gaspar Cola, MD  Service setting: Ambulatory outpatient  Specialty: Interventional Pain Management  Patient type: Established  Location: [facility] (AMB) Pain Management Facility  Visit type: Interventional Procedure   Primary Reason for Visit: Interventional Pain Management Treatment. CC: Procedure  Procedure:          Anesthesia, Analgesia, Anxiolysis:  Type: Therapeutic Intra-Articular Hyalgan Knee Injection #4  Region: Lateral infrapatellar Knee Region Level: Knee Joint Laterality: Bilateral  Type: Local Anesthesia Indication(s): Analgesia         Local Anesthetic: Lidocaine 1-2% Route: Infiltration ([place]/IM) IV Access: Declined Sedation: Declined   Position: Sitting   Indications: 1. Osteoarthritis of knee (Bilateral)   2. Chronic knee pain (Fourth Area of Pain) (Bilateral) (L>R)    Pain Score: Pre-procedure: 1 /10 Post-procedure: 1 /10  Pre-op Assessment:  Charlene [name] is a 58 y.o. (year old), female patient, seen today for interventional treatment. She  has a past surgical history that includes arthroscopic rotator cuff; Colonoscopy; Abdominal hysterectomy; endosopic sinus; and Cataract extraction w/PHACO (Right, 11/25/2017). Charlene [name] has a current medication list which includes the following prescription(s): amlodipine, amlodipine, baclofen, gnp calcium 1200, vitamin d3, duloxetine, fluticasone, gabapentin, hydrocodone-acetaminophen, insulin glargine, insulin glargine (2 unit dial), lidocaine-menthol, liraglutide, lisinopril, magnesium, meloxicam, metformin, metoprolol succinate, pantoprazole, polyethylene glycol, tramadol, vitamin d (ergocalciferol), insulin aspart, and lidocaine. Her primarily concern today is the Procedure  Initial Vital Signs:  Pulse/HCG Rate: 82  Temp: (!) 97.5 F (36.4 C) Resp: 16 BP: (!)  195/113 SpO2: 100 %  BMI: Estimated body mass index is 23.03 kg/m as calculated from the following:   Height as of this encounter: 4\' 11"  (1.499 m).   Weight as of this encounter: 114 lb (51.7 kg).  Risk Assessment: Allergies: Reviewed. She has No Known Allergies.  Allergy Precautions: None required Coagulopathies: Reviewed. None identified.  Blood-thinner therapy: None at this time Active Infection(s): Reviewed. None identified. Charlene [name] is afebrile  Site Confirmation: Charlene [name] was asked to confirm the procedure and laterality before marking the site Procedure checklist: Completed Consent: Before the procedure and under the influence of no sedative(s), amnesic(s), or anxiolytics, the patient was informed of the treatment options, risks and possible complications. To fulfill our ethical and legal obligations, as recommended by the American Medical Association's Code of Ethics, I have informed the patient of my clinical impression; the nature and purpose of the treatment or procedure; the risks, benefits, and possible complications of the intervention; the alternatives, including doing nothing; the risk(s) and benefit(s) of the alternative treatment(s) or procedure(s); and the risk(s) and benefit(s) of doing nothing. The patient was provided information about the general risks and possible complications associated with the procedure. These may include, but are not limited to: failure to achieve desired goals, infection, bleeding, organ or nerve damage, allergic reactions, paralysis, and death. In addition, the patient was informed of those risks and complications associated to the procedure, such as failure to decrease pain; infection; bleeding; organ or nerve damage with subsequent damage to sensory, motor, and/or autonomic systems, resulting in permanent pain, numbness, and/or weakness of one or several areas of the body; allergic reactions; (i.e.: anaphylactic reaction); and/or  death. Furthermore, the patient was informed of those risks and complications associated with the medications. These include, but are not limited to: allergic reactions (i.e.: anaphylactic or anaphylactoid reaction(s)); adrenal axis  suppression; blood sugar elevation that in diabetics may result in ketoacidosis or comma; water retention that in patients with history of congestive heart failure may result in shortness of breath, pulmonary edema, and decompensation with resultant heart failure; weight gain; swelling or edema; medication-induced neural toxicity; particulate matter embolism and blood vessel occlusion with resultant organ, and/or nervous system infarction; and/or aseptic necrosis of one or more joints. Finally, the patient was informed that Medicine is not an exact science; therefore, there is also the possibility of unforeseen or unpredictable risks and/or possible complications that may result in a catastrophic outcome. The patient indicated having understood very clearly. We have given the patient no guarantees and we have made no promises. Enough time was given to the patient to ask questions, all of which were answered to the patient's satisfaction. Charlene [name] has indicated that she wanted to continue with the procedure. Attestation: I, the ordering provider, attest that I have discussed with the patient the benefits, risks, side-effects, alternatives, likelihood of achieving goals, and potential problems during recovery for the procedure that I have provided informed consent. Date  Time: 02/11/2018 10:43 AM  Pre-Procedure Preparation:  Monitoring: As per clinic protocol. Respiration, ETCO2, SpO2, BP, heart rate and rhythm monitor placed and checked for adequate function Safety Precautions: Patient was assessed for positional comfort and pressure points before starting the procedure. Time-out: I initiated and conducted the "Time-out" before starting the procedure, as per protocol. The  patient was asked to participate by confirming the accuracy of the "Time Out" information. Verification of the correct person, site, and procedure were performed and confirmed by me, the nursing staff, and the patient. "Time-out" conducted as per Joint Commission's Universal Protocol (UP.01.01.01). Time: 1047  Description of Procedure:          Target Area: Knee Joint Approach: Just above the Lateral tibial plateau, lateral to the infrapatellar tendon. Area Prepped: Entire knee area, from the mid-thigh to the mid-shin. Prepping solution: ChloraPrep (2% chlorhexidine gluconate and 70% isopropyl alcohol) Safety Precautions: Aspiration looking for blood return was conducted prior to all injections. At no point did we inject any substances, as a needle was being advanced. No attempts were made at seeking any paresthesias. Safe injection practices and needle disposal techniques used. Medications properly checked for expiration dates. SDV (single dose vial) medications used. Description of the Procedure: Protocol guidelines were followed. The patient was placed in position over the fluoroscopy table. The target area was identified and the area prepped in the usual manner. Skin & deeper tissues infiltrated with local anesthetic. Appropriate amount of time allowed to pass for local anesthetics to take effect. The procedure needles were then advanced to the target area. Proper needle placement secured. Negative aspiration confirmed. Solution injected in intermittent fashion, asking for systemic symptoms every 0.5cc of injectate. The needles were then removed and the area cleansed, making sure to leave some of the prepping solution back to take advantage of its long term bactericidal properties. Vitals:   02/11/18 1040 02/11/18 1043  BP: (!) 195/113 (!) 202/105  Pulse: 82 77  Resp: 16   Temp: (!) 97.5 F (36.4 C)   SpO2: 100%   Weight: 114 lb (51.7 kg)   Height: 4\' 11"  (1.499 m)     Start Time: 1048  hrs. End Time: 1049 hrs. Materials:  Needle(s) Type: Regular needle Gauge: 25G Length: 1.5-in Medication(s): Please see orders for medications and dosing details.  Imaging Guidance:          Type  of Imaging Technique: None used Indication(s): N/A Exposure Time: No patient exposure Contrast: None used. Fluoroscopic Guidance: N/A Ultrasound Guidance: N/A Interpretation: N/A  Antibiotic Prophylaxis:   Anti-infectives (From admission, onward)   None     Indication(s): None identified  Post-operative Assessment:  Post-procedure Vital Signs:  Pulse/HCG Rate: 77  Temp: (!) 97.5 F (36.4 C) Resp: 16 BP: (!) 202/105 SpO2: 100 %  EBL: None  Complications: No immediate post-treatment complications observed by team, or reported by patient.  Note: The patient tolerated the entire procedure well. A repeat set of vitals were taken after the procedure and the patient was kept under observation following institutional policy, for this type of procedure. Post-procedural neurological assessment was performed, showing return to baseline, prior to discharge. The patient was provided with post-procedure discharge instructions, including a section on how to identify potential problems. Should any problems arise concerning this procedure, the patient was given instructions to immediately contact us, at any time, without hesitation. In any case, we plan to contact the patient by telephone for a follow-up status report regarding this interventional procedure.  Comments:  No additional relevant information.  Plan of Care   Possible POC:  Return to clinic seen in 2 weeks for bilateral intra-articular Hyalgan knee injection #5 + diagnostic/therapeutic left-sided L5-S1 interlaminar LESI #1 under fluoroscopic guidance and IV sedation    Imaging Orders  No imaging studies ordered today    Procedure Orders     KNEE INJECTION     KNEE INJECTION     Lumbar Epidural Injection  Medications ordered  for procedure: Meds ordered this encounter  Medications  . lidocaine (PF) (XYLOCAINE) 1 % injection 4 mL  . ropivacaine (PF) 2 mg/mL (0.2%) (NAROPIN) injection 4 mL  . Sodium Hyaluronate SOSY 2 mL  . Sodium Hyaluronate SOSY 2 mL   Medications administered: We administered lidocaine (PF), ropivacaine (PF) 2 mg/mL (0.2%), Sodium Hyaluronate, and Sodium Hyaluronate.  See the medical record for exact dosing, route, and time of administration.  Disposition: Discharge home  Discharge Date & Time: 02/11/2018; 1102 hrs.   Physician-requested Follow-up: Return for Procedure (w/ sedation): (B) Hyalgan #5 + (L) LESI.  Future Appointments  Date Time Provider [place]  02/15/2018  8:30 AM Vevelyn Francois, NP [facility] None  02/25/2018  8:00 AM Milinda Pointer, MD [facility] None   Primary Care Physician: Tracie Harrier, MD Location: [facility] Out[facility] Management [facility] Note by: Gaspar Cola, MD Date: 02/11/2018; Time: 12:36 PM  Disclaimer:  Medicine is not an exact science. The only guarantee in medicine is that nothing is guaranteed. It is important to note that the decision to proceed with this intervention was based on the information collected from the patient. The Data and conclusions were drawn from the patient's questionnaire, the interview, and the physical examination. Because the information was provided in large part by the patient, it cannot be guaranteed that it has not been purposely or unconsciously manipulated. Every effort has been made to obtain as much relevant data as possible for this evaluation. It is important to note that the conclusions that lead to this procedure are derived in large part from the available data. Always take into account that the treatment will also be dependent on availability of resources and existing treatment guidelines, considered by other Pain Management Practitioners as being common knowledge and practice, at the time of the  intervention. For Medico-Legal purposes, it is also important to point out that variation in procedural techniques and pharmacological  choices are the acceptable norm. The indications, contraindications, technique, and results of the above procedure should only be interpreted and judged by a Board-Certified Interventional Pain Specialist with extensive familiarity and expertise in the same exact procedure and technique.

## 2018-02-12 ENCOUNTER — Telehealth: Payer: Self-pay

## 2018-02-12 NOTE — Telephone Encounter (Signed)
Pt was called, no answer, message left on answering service.

## 2018-02-15 ENCOUNTER — Encounter: Payer: BLUE CROSS/BLUE SHIELD | Admitting: Nurse Practitioner

## 2018-02-24 ENCOUNTER — Other Ambulatory Visit: Payer: Self-pay

## 2018-02-24 ENCOUNTER — Encounter: Payer: Self-pay | Admitting: Nurse Practitioner

## 2018-02-24 ENCOUNTER — Ambulatory Visit: Payer: BLUE CROSS/BLUE SHIELD | Attending: Nurse Practitioner | Admitting: Nurse Practitioner

## 2018-02-24 VITALS — BP 162/83 | HR 90 | Temp 98.3°F | Resp 16 | Ht 59.0 in | Wt 115.0 lb

## 2018-02-24 DIAGNOSIS — M79604 Pain in right leg: Secondary | ICD-10-CM | POA: Diagnosis present

## 2018-02-24 DIAGNOSIS — M5442 Lumbago with sciatica, left side: Secondary | ICD-10-CM | POA: Insufficient documentation

## 2018-02-24 DIAGNOSIS — M7918 Myalgia, other site: Secondary | ICD-10-CM | POA: Insufficient documentation

## 2018-02-24 DIAGNOSIS — M79605 Pain in left leg: Secondary | ICD-10-CM | POA: Diagnosis present

## 2018-02-24 DIAGNOSIS — E559 Vitamin D deficiency, unspecified: Secondary | ICD-10-CM | POA: Diagnosis present

## 2018-02-24 DIAGNOSIS — G8929 Other chronic pain: Secondary | ICD-10-CM | POA: Insufficient documentation

## 2018-02-24 DIAGNOSIS — M5136 Other intervertebral disc degeneration, lumbar region: Secondary | ICD-10-CM | POA: Insufficient documentation

## 2018-02-24 DIAGNOSIS — M48061 Spinal stenosis, lumbar region without neurogenic claudication: Secondary | ICD-10-CM | POA: Insufficient documentation

## 2018-02-24 DIAGNOSIS — M25561 Pain in right knee: Secondary | ICD-10-CM | POA: Insufficient documentation

## 2018-02-24 DIAGNOSIS — G63 Polyneuropathy in diseases classified elsewhere: Secondary | ICD-10-CM | POA: Insufficient documentation

## 2018-02-24 DIAGNOSIS — M25562 Pain in left knee: Secondary | ICD-10-CM | POA: Diagnosis present

## 2018-02-24 DIAGNOSIS — G894 Chronic pain syndrome: Secondary | ICD-10-CM | POA: Diagnosis present

## 2018-02-24 MED ORDER — GNP CALCIUM 1200 1200-1000 MG-UNIT PO CHEW: 1200.0000 mg | CHEWABLE_TABLET | Freq: Every day | ORAL | 5 refills | Status: DC

## 2018-02-24 MED ORDER — MAGNESIUM 500 MG PO CAPS: 500.0000 mg | ORAL_CAPSULE | Freq: Two times a day (BID) | ORAL | 5 refills | Status: DC

## 2018-02-24 MED ORDER — VITAMIN D3 125 MCG (5000 UT) PO CAPS: 1.0000 | ORAL_CAPSULE | Freq: Every day | ORAL | 5 refills | Status: DC

## 2018-02-24 MED ORDER — TRAMADOL HCL 50 MG PO TABS: 50.0000 mg | ORAL_TABLET | Freq: Three times a day (TID) | ORAL | 2 refills | Status: DC | PRN

## 2018-02-24 MED ORDER — BACLOFEN 10 MG PO TABS: 10.0000 mg | ORAL_TABLET | Freq: Three times a day (TID) | ORAL | 2 refills | Status: DC

## 2018-02-24 NOTE — Progress Notes (Signed)
Patient's Name: Charlene [name]  MRN: [id number]  Referring Provider: Tracie Harrier, MD  DOB: [birth year]  PCP: Tracie Harrier, MD  DOS: 02/24/2018  Note by: Vevelyn Francois NP  Service setting: Ambulatory outpatient  Specialty: Interventional Pain Management  Location: [facility] (AMB) Pain Management Facility    Patient type: Established    Primary Reason(s) for Visit: Encounter for prescription drug management & post-procedure evaluation of chronic illness with mild to moderate exacerbation(Level of risk: moderate) CC: Back Pain (lower) and Leg Pain (left)  HPI  Charlene [name] is a 58 y.o. year old, female patient, who comes today for a post-procedure evaluation and medication management. She has Burning sensation of feet; DM type 2, uncontrolled, with neuropathy ([place]); GERD without esophagitis; Essential (primary) hypertension; Neuropathic pain; Neuropathy; Lower extremity numbness and tingling (Left); Polyneuropathy associated with underlying disease ([place]); Chronic ankle pain (Primary Area of Pain) (Left); Chronic foot pain (Primary Area of Pain) (Left); Chronic lower extremity pain (Secondary Area of Pain) (Bilateral) (L>R); Chronic knee pain (Fourth Area of Pain) (Bilateral) (L>R); Chronic low back pain [facility] Area of Pain) (Bilateral) (L>R) w/ sciatica (Left); Chronic pain syndrome; Opiate use; Pharmacologic therapy; Disorder of skeletal system; Problems influencing health status; Vitamin D deficiency; DM type 2 with diabetic peripheral neuropathy ([place]); DDD (degenerative disc disease), lumbar; Lumbar facet arthropathy; Lumbar facet syndrome (Bilateral); Lumbar facet hypertrophy; Lumbar foraminal stenosis (L5-S1) (Right); Osteoarthritis of knee (Bilateral); Chronic musculoskeletal pain; Long-term insulin use ([place]); Uncontrolled type 2 diabetes mellitus with hyperglycemia ([place]); and Abnormal EMG (07/06/2017) on their problem list. Her primarily concern today is the Back Pain (lower) and Leg Pain  (left)  Pain Assessment: Location: Lower Back Radiating: Pain starts at left thigh to left foot and ankle Onset: More than a month ago Duration: Chronic pain Quality: Sore, Burning, Numbness("left foot feels heavy") Severity: 1 /10 (subjective, self-reported pain score)  Note: Reported level is compatible with observation.                          Effect on ADL: unsteady when walking and sometimes hard to walk due to pain in heel and ankle Timing: Constant Modifying factors: injections and meds help "so so" BP: (!) 162/83(did not take BP med this am)  HR: 90  Charlene [name] was last seen on 02/12/2018 for a procedure. During today's appointment we reviewed Charlene [name]'s post-procedure results, as well as her outpatient medication regimen. She continues to have heaviness in her left leg; ankle and foot. She has swelling and stiffness. She would like some relief with this pain.   Further details on both, my assessment(s), as well as the proposed treatment plan, please see below.  Controlled Substance Pharmacotherapy Assessment REMS (Risk Evaluation and Mitigation Strategy)  Analgesic: Tramadol 50 mg 1 tablet 3 times daily (last fill date 09/01/2017) tramadol 150 mg/day MME/day: '15mg'$ /day Rise Patience, RN  02/24/2018  9:29 AM  Signed Nursing Pain Medication Assessment:  Safety precautions to be maintained throughout the outpatient stay will include: orient to surroundings, keep bed in low position, maintain call bell within reach at all times, provide assistance with transfer out of bed and ambulation.  Medication Inspection Compliance: Charlene [name] did not comply with our request to bring her pills to be counted. She was reminded that bringing the medication bottles, even when empty, is a requirement.  Medication: None brought in. Pill/Patch Count: None available to be counted. Bottle Appearance: No container available. Did not bring bottle(s) to  appointment. Filled Date: N/A Last Medication  intake:  Today   Pharmacokinetics: Liberation and absorption (onset of action): WNL Distribution (time to peak effect): WNL Metabolism and excretion (duration of action): WNL         Pharmacodynamics: Desired effects: Analgesia: Charlene [name] reports >50% benefit. Functional ability: Patient reports that medication allows her to accomplish basic ADLs Clinically meaningful improvement in function (CMIF): Sustained CMIF goals met Perceived effectiveness: Described as relatively effective, allowing for increase in activities of daily living (ADL) Undesirable effects: Side-effects or Adverse reactions: None reported Monitoring: [place] PMP: Online review of the past 62-monthperiod conducted. Compliant with practice rules and regulations Last UDS on record: Summary  Date Value Ref Range Status  10/12/2017 FINAL  Final    Comment:    ==================================================================== [facility]RE COMP DRUG ANALYSIS,UR ==================================================================== Test                             Result       Flag       Units Drug Present and Declared for Prescription Verification   Tramadol                       >>03500      EXPECTED   ng/mg creat   O-Desmethyltramadol            8266         EXPECTED   ng/mg creat   N-Desmethyltramadol            2066         EXPECTED   ng/mg creat    Source of tramadol is a prescription medication.    O-desmethyltramadol and N-desmethyltramadol are expected    metabolites of tramadol.   Gabapentin                     PRESENT      EXPECTED   Baclofen                       PRESENT      EXPECTED   Metoprolol                     PRESENT      EXPECTED Drug Present not Declared for Prescription Verification   Alcohol, Ethyl                 0.154        UNEXPECTED g/dL    Sources of ethyl alcohol include alcoholic beverages or as a    fermentation product of glucose; glucose is present in this    specimen.  Interpret result  with caution, as the presence of    ethyl alcohol is likely due, at least in part, to fermentation of    glucose.   Naproxen                       PRESENT      UNEXPECTED Drug Absent but Declared for Prescription Verification   Tizanidine                     Not Detected UNEXPECTED    Tizanidine, as indicated in the declared medication list, is not    always detected even when used as directed.   Duloxetine  Not Detected UNEXPECTED   Lidocaine                      Not Detected UNEXPECTED    Lidocaine, as indicated in the declared medication list, is not    always detected even when used as directed. ==================================================================== Test                      Result    Flag   Units      Ref Range   Creatinine              47               mg/dL      >=20 ==================================================================== Declared Medications:  The flagging and interpretation on this report are based on the  following declared medications.  Unexpected results may arise from  inaccuracies in the declared medications.  **Note: The testing scope of this panel includes these medications:  Baclofen  Duloxetine  Gabapentin  Metoprolol  Tramadol  **Note: The testing scope of this panel does not include small to  moderate amounts of these reported medications:  Lidocaine  Tizanidine  **Note: The testing scope of this panel does not include following  reported medications:  Fluticasone  Hydrochlorothiazide (Lisinopril-HCTZ)  Insulin  Lisinopril (Lisinopril-HCTZ)  Metformin  Omeprazole  Pantoprazole  Potassium ==================================================================== For clinical consultation, please call [phone]. ====================================================================    UDS interpretation: Compliant          Medication Assessment Form: Reviewed. Patient indicates being compliant with  therapy Treatment compliance: Compliant Risk Assessment Profile: Aberrant behavior: See prior evaluations. None observed or detected today Comorbid factors increasing risk of overdose: See prior notes. No additional risks detected today Opioid risk tool (ORT) (Total Score): 4 Personal History of Substance Abuse (SUD-Substance use disorder):  Alcohol: Negative  Illegal Drugs: Negative  Rx Drugs: Negative  ORT Risk Level calculation: Moderate Risk Risk of substance use disorder (SUD): Low Opioid Risk Tool - 02/24/18 0928      Family History of Substance Abuse   Alcohol  Negative    Illegal Drugs  Negative    Rx Drugs  Positive Female or Female      Personal History of Substance Abuse   Alcohol  Negative    Illegal Drugs  Negative    Rx Drugs  Negative      Age   Age between 56-45 years   No      History of Preadolescent Sexual Abuse   History of Preadolescent Sexual Abuse  Negative or Female      Psychological Disease   Psychological Disease  Negative    Depression  Negative      Total Score   Opioid Risk Tool Scoring  4    Opioid Risk Interpretation  Moderate Risk      ORT Scoring interpretation table:  Score <3 = Low Risk for SUD  Score between 4-7 = Moderate Risk for SUD  Score >8 = High Risk for Opioid Abuse   Risk Mitigation Strategies:  Patient Counseling: Covered Patient-Prescriber Agreement (PPA): Present and active  Notification to other healthcare providers: Done  Pharmacologic Plan: No change in therapy, at this time.             Post-Procedure Assessment  02/11/2018 Procedure: Bilateral Hyalgan series #4 Pre-procedure pain score:  1/10 Post-procedure pain score: 1/10         Influential Factors: BMI: 23.23 kg/m Intra-procedural  challenges: None observed.         Assessment challenges: None detected.              Reported side-effects: None.        Post-procedural adverse reactions or complications: None reported         Sedation: Please see nurses  note. When no sedatives are used, the analgesic levels obtained are directly associated to the effectiveness of the local anesthetics. However, when sedation is provided, the level of analgesia obtained during the initial 1 hour following the intervention, is believed to be the result of a combination of factors. These factors may include, but are not limited to: 1. The effectiveness of the local anesthetics used. 2. The effects of the analgesic(s) and/or anxiolytic(s) used. 3. The degree of discomfort experienced by the patient at the time of the procedure. 4. The patients ability and reliability in recalling and recording the events. 5. The presence and influence of possible secondary gains and/or psychosocial factors. Reported result: Relief experienced during the 1st hour after the procedure: 100 % (Ultra-Short Term Relief)            Interpretative annotation: Clinically appropriate result. Analgesia during this period is likely to be Local Anesthetic and/or IV Sedative (Analgesic/Anxiolytic) related.          Effects of local anesthetic: The analgesic effects attained during this period are directly associated to the localized infiltration of local anesthetics and therefore cary significant diagnostic value as to the etiological location, or anatomical origin, of the pain. Expected duration of relief is directly dependent on the pharmacodynamics of the local anesthetic used. Long-acting (4-6 hours) anesthetics used.  Reported result: Relief during the next 4 to 6 hour after the procedure: 100 % (Short-Term Relief)            Interpretative annotation: Clinically appropriate result. Analgesia during this period is likely to be Local Anesthetic-related.          Long-term benefit: Defined as the period of time past the expected duration of local anesthetics (1 hour for short-acting and 4-6 hours for long-acting). With the possible exception of prolonged sympathetic blockade from the local  anesthetics, benefits during this period are typically attributed to, or associated with, other factors such as analgesic sensory neuropraxia, antiinflammatory effects, or beneficial biochemical changes provided by agents other than the local anesthetics.  Reported result: Extended relief following procedure: 100 %(X 2 weeks) (Long-Term Relief)            Interpretative annotation: Clinically possible results. Good relief. No permanent benefit expected. Inflammation plays a part in the etiology to the pain.          Current benefits: Defined as reported results that persistent at this point in time.   Analgesia: <50 %            Function: Charlene [name] reports improvement in function ROM: Charlene [name] reports improvement in ROM Interpretative annotation: Good relief.    Effective diagnostic intervention.          Interpretation: Results would suggest a successful diagnostic intervention.                  Plan:  Please see "Plan of Care" for details.                Laboratory Chemistry  Inflammation Markers (CRP: Acute Phase) (ESR: Chronic Phase) Lab Results  Component Value Date   CRP 4 10/12/2017   ESRSEDRATE 12 10/12/2017  Rheumatology Markers No results found for: RF, ANA, LABURIC, URICUR, LYMEIGGIGMAB, LYMEABIGMQN, HLAB27                      Renal Function Markers Lab Results  Component Value Date   BUN 14 12/17/2017   CREATININE 0.45 12/17/2017   BCR 17 10/12/2017   GFRAA >60 12/17/2017   GFRNONAA >60 12/17/2017                             Hepatic Function Markers Lab Results  Component Value Date   AST 18 12/17/2017   ALT 18 12/17/2017   ALBUMIN 4.1 12/17/2017   ALKPHOS 76 12/17/2017                        Electrolytes Lab Results  Component Value Date   NA 138 12/17/2017   K 4.1 12/17/2017   CL 98 12/17/2017   CALCIUM 9.9 12/17/2017   MG 2.0 10/12/2017                        Neuropathy Markers Lab Results  Component Value Date    VITAMINB12 1,063 10/12/2017                        CNS Tests No results found for: COLORCSF, APPEARCSF, RBCCOUNTCSF, WBCCSF, POLYSCSF, LYMPHSCSF, EOSCSF, PROTEINCSF, GLUCCSF, JCVIRUS, CSFOLI, IGGCSF                      Bone Pathology Markers Lab Results  Component Value Date   25OHVITD1 17 (L) 10/12/2017   25OHVITD2 <1.0 10/12/2017   25OHVITD3 17 10/12/2017                         Coagulation Parameters Lab Results  Component Value Date   PLT 296 12/17/2017                        Cardiovascular Markers Lab Results  Component Value Date   HGB 12.5 12/17/2017   HCT 37.7 12/17/2017                         CA Markers No results found for: CEA, CA125, LABCA2                      Endocrine Markers No results found for: TSH, FREET4, TESTOFREE, TESTOSTERONE, ESTRADIOL, ESTRADIOLPCT, ESTRADIOLFRE                      Note: Lab results reviewed.  Recent Diagnostic Imaging Results  US Venous Img Lower Unilateral Left CLINICAL DATA:  58 year old female with left lower extremity pain and swelling  EXAM: LEFT LOWER EXTREMITY VENOUS DOPPLER ULTRASOUND  TECHNIQUE: Gray-scale sonography with graded compression, as well as color Doppler and duplex ultrasound were performed to evaluate the lower extremity deep venous systems from the level of the common femoral vein and including the common femoral, femoral, profunda femoral, popliteal and calf veins including the posterior tibial, peroneal and gastrocnemius veins when visible. The superficial great saphenous vein was also interrogated. Spectral Doppler was utilized to evaluate flow at rest and with distal augmentation maneuvers in the common femoral, femoral and popliteal veins.  COMPARISON:  None.  FINDINGS: Contralateral Common Femoral  Vein: Respiratory phasicity is normal and symmetric with the symptomatic side. No evidence of thrombus. Normal compressibility.  Common Femoral Vein: No evidence of thrombus.  Normal compressibility, respiratory phasicity and response to augmentation.  Saphenofemoral Junction: No evidence of thrombus. Normal compressibility and flow on color Doppler imaging.  Profunda Femoral Vein: No evidence of thrombus. Normal compressibility and flow on color Doppler imaging.  Femoral Vein: No evidence of thrombus. Normal compressibility, respiratory phasicity and response to augmentation.  Popliteal Vein: No evidence of thrombus. Normal compressibility, respiratory phasicity and response to augmentation.  Calf Veins: No evidence of thrombus. Normal compressibility and flow on color Doppler imaging.  Superficial Great Saphenous Vein: No evidence of thrombus. Normal compressibility.  Venous Reflux:  None.  Other Findings:  None.  IMPRESSION: No evidence of deep venous thrombosis.  Electronically Signed   By: Jacqulynn Cadet M.D.   On: 02/05/2018 15:22  Complexity Note: Imaging results reviewed. Results shared with Charlene [name], using Layman's terms.                         Meds   Current Outpatient Medications:  .  amLODipine (NORVASC) 2.5 MG tablet, , Disp: , Rfl:  .  amLODipine (NORVASC) 2.5 MG tablet, Take by mouth., Disp: , Rfl:  .  baclofen (LIORESAL) 10 MG tablet, Take 1 tablet (10 mg total) by mouth 3 (three) times daily., Disp: 90 tablet, Rfl: 2 .  [START ON 04/19/2018] Calcium Carbonate-Vit D-Min (GNP CALCIUM 1200) 1200-1000 MG-UNIT CHEW, Chew 1,200 mg by mouth daily with breakfast. Take in combination with vitamin D and magnesium., Disp: 30 tablet, Rfl: 5 .  [START ON 04/19/2018] Cholecalciferol (VITAMIN D3) 125 MCG (5000 UT) CAPS, Take 1 capsule (5,000 Units total) by mouth daily with breakfast. Take along with calcium and magnesium., Disp: 30 capsule, Rfl: 5 .  DULoxetine (CYMBALTA) 30 MG capsule, Take 30 mg by mouth daily., Disp: , Rfl:  .  gabapentin (NEURONTIN) 800 MG tablet, Take 800 mg by mouth 4 (four) times daily., Disp: , Rfl:  .  Insulin  Glargine, 2 Unit Dial, (TOUJEO MAX SOLOSTAR) 300 UNIT/ML SOPN, Inject 140 Units into the skin daily. 30 units AM and 110 units HS, Disp: , Rfl:  .  Lidocaine-Menthol (ICY HOT LIDOCAINE PLUS MENTHOL EX), Apply 1 application topically., Disp: , Rfl:  .  liraglutide (VICTOZA) 18 MG/3ML SOPN, Inject 1.8 mg into the skin daily., Disp: , Rfl:  .  lisinopril (PRINIVIL,ZESTRIL) 40 MG tablet, Take 40 mg by mouth daily., Disp: , Rfl:  .  Magnesium 500 MG CAPS, Take 1 capsule (500 mg total) by mouth 2 (two) times daily at 8 am and 10 pm., Disp: 60 capsule, Rfl: 5 .  meloxicam (MOBIC) 15 MG tablet, Take 15 mg by mouth daily., Disp: , Rfl:  .  metoprolol succinate (TOPROL-XL) 50 MG 24 hr tablet, Take 25 mg by mouth 2 (two) times daily. , Disp: , Rfl:  .  pantoprazole (PROTONIX) 40 MG tablet, Take 40 mg by mouth 2 (two) times daily., Disp: , Rfl:  .  polyethylene glycol (MIRALAX / GLYCOLAX) packet, Take 17 g by mouth daily. Mix one tablespoon with 8oz of your favorite juice or water every day until you are having soft formed stools. Then start taking once daily if you didn't have a stool the day before., Disp: 30 each, Rfl: 0 .  traMADol (ULTRAM) 50 MG tablet, Take 1 tablet (50 mg total) by mouth every 8 (  eight) hours as needed for severe pain., Disp: 90 tablet, Rfl: 2  ROS  Constitutional: Denies any fever or chills Gastrointestinal: No reported hemesis, hematochezia, vomiting, or acute GI distress Musculoskeletal: Denies any acute onset joint swelling, redness, loss of ROM, or weakness Neurological: No reported episodes of acute onset apraxia, aphasia, dysarthria, agnosia, amnesia, paralysis, loss of coordination, or loss of consciousness  Allergies  Charlene [name] has No Known Allergies.  [place]  Drug: Charlene [name]  reports no history of drug use. Alcohol:  reports previous alcohol use. Tobacco:  reports that she has never smoked. She has never used smokeless tobacco. Medical:  has a past medical history of  Arthritis, Degenerative disc disease, lumbar, Diabetes mellitus without complication ([place]), GERD (gastroesophageal reflux disease), Hypertension, Neuropathy, Polyneuropathy, and Polyneuropathy. Surgical: Charlene [name]  has a past surgical history that includes arthroscopic rotator cuff; Colonoscopy; Abdominal hysterectomy; endosopic sinus; and Cataract extraction w/PHACO (Right, 11/25/2017). Family: family history includes Alcohol abuse in her father; Breast cancer in her maternal aunt; Cancer in her father.  Constitutional Exam  General appearance: Well nourished, well developed, and well hydrated. In no apparent acute distress Vitals:   02/24/18 0914  BP: (!) 162/83  Pulse: 90  Resp: 16  Temp: 98.3 F (36.8 C)  TempSrc: Oral  SpO2: 100%  Weight: 115 lb (52.2 kg)  Height: '4\' 11"'$  (1.499 m)  Psych/Mental status: Alert, oriented x 3 (person, place, & time)       Eyes: PERLA Respiratory: No evidence of acute respiratory distress  Lumbar Spine Area Exam  Skin & Axial Inspection: No masses, redness, or swelling Alignment: Symmetrical Functional ROM: Unrestricted ROM       Stability: No instability detected Muscle Tone/Strength: Functionally intact. No obvious neuro-muscular anomalies detected. Sensory (Neurological): Unimpaired Palpation: Complains of area being tender to palpation       Provocative Tests: Hyperextension/rotation test: Positive       Lumbar quadrant test (Kemp's test): deferred today       Lateral bending test: deferred today       Patrick's Maneuver: deferred today                     Gait & Posture Assessment  Ambulation: Unassisted Gait: Relatively normal for age and body habitus Posture: WNL   Lower Extremity Exam    Side: Right lower extremity  Side: Left lower extremity  Stability: No instability observed          Stability: No instability observed          Skin & Extremity Inspection: Skin color, temperature, and hair growth are WNL. No peripheral edema or  cyanosis. No masses, redness, swelling, asymmetry, or associated skin lesions. No contractures.  Skin & Extremity Inspection: Edema  Functional ROM: Unrestricted ROM                  Functional ROM: Decreased ROM                  Muscle Tone/Strength: Functionally intact. No obvious neuro-muscular anomalies detected.  Muscle Tone/Strength: L5 myotomal (EHL) weakness (Toe Dorsiflexion)  Sensory (Neurological): Unimpaired        Sensory (Neurological): Dermatomal pain pattern            Palpation: No palpable anomalies  Palpation: No palpable anomalies   Assessment  Primary Diagnosis & Pertinent Problem List: The primary encounter diagnosis was Chronic lower extremity pain (Secondary Area of Pain) (Bilateral) (L>R). Diagnoses of Chronic low back  pain (Tertiary Area of Pain) (Bilateral) (L>R) w/ sciatica (Left), Polyneuropathy associated with underlying disease (Charlene [name]), DDD (degenerative disc disease), lumbar, Chronic knee pain (Fourth Area of Pain) (Bilateral) (L>R), Lumbar foraminal stenosis (L5-S1) (Right), Chronic musculoskeletal pain, Vitamin D deficiency, and Chronic pain syndrome were also pertinent to this visit.  Status Diagnosis  Persistent Persistent Controlled 1. Chronic lower extremity pain (Secondary Area of Pain) (Bilateral) (L>R)   2. 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 Polyneuropathy associated with underlying disease ([place])   4. DDD (degenerative disc disease), lumbar   5. Chronic knee pain (Fourth Area of Pain) (Bilateral) (L>R)   6. Lumbar foraminal stenosis (L5-S1) (Right)   7. Chronic musculoskeletal pain   8. Vitamin D deficiency   9. Chronic pain syndrome     Problems updated and reviewed during this visit: No problems updated. Plan of Care  Pharmacotherapy (Medications Ordered): Meds ordered this encounter  Medications  . baclofen (LIORESAL) 10 MG tablet    Sig: Take 1 tablet (10 mg total) by mouth 3 (three) times  daily.    Dispense:  90 tablet    Refill:  2    Do not place medication on "Automatic Refill". Fill one day early if pharmacy is closed on scheduled refill date.    Order Specific Question:   Supervising Provider    Answer:   Milinda Pointer [phone]  . Calcium Carbonate-Vit D-Min (GNP CALCIUM 1200) 1200-1000 MG-UNIT CHEW    Sig: Chew 1,200 mg by mouth daily with breakfast. Take in combination with vitamin D and magnesium.    Dispense:  30 tablet    Refill:  5    Do not place medication on "Automatic Refill".  May substitute with similar over-the-counter product.    Order Specific Question:   Supervising Provider    Answer:   Milinda Pointer [phone]  . Cholecalciferol (VITAMIN D3) 125 MCG (5000 UT) CAPS    Sig: Take 1 capsule (5,000 Units total) by mouth daily with breakfast. Take along with calcium and magnesium.    Dispense:  30 capsule    Refill:  5    Do not place medication on "Automatic Refill".  May substitute with similar over-the-counter product.    Order Specific Question:   Supervising Provider    Answer:   Milinda Pointer [phone]  . Magnesium 500 MG CAPS    Sig: Take 1 capsule (500 mg total) by mouth 2 (two) times daily at 8 am and 10 pm.    Dispense:  60 capsule    Refill:  5    Do not place medication on "Automatic Refill".  The patient may use similar over-the-counter product.    Order Specific Question:   Supervising Provider    Answer:   Milinda Pointer [phone]  . traMADol (ULTRAM) 50 MG tablet    Sig: Take 1 tablet (50 mg total) by mouth every 8 (eight) hours as needed for severe pain.    Dispense:  90 tablet    Refill:  2    Do not place this medication, or any other prescription from our practice, on "Automatic Refill". Patient may have prescription filled one day early if pharmacy is closed on scheduled refill date.    Order Specific Question:   Supervising Provider    Answer:   Milinda Pointer [[id number]]   New Prescriptions   No medications on  file   Medications administered today: Charlene [name] had no medications administered  during this visit. Lab-work, procedure(s), and/or referral(s): No orders of the defined types were placed in this encounter.  Imaging and/or referral(s): None  Interventional therapies: Planned, scheduled, and/or pending:   bilateral intra-articular Hyalgan knee injection #5 + diagnostic/therapeutic left-sided L5-S1 interlaminar LESI #1 under fluoroscopic guidance and IV sedation    Considering:   Diagnostic left-sided lumbar sympathetic block  Possible left-sided lumbar sympathetic RFA  Diagnostic left-sided L4-5 interlaminar LESI  Diagnostic right-sided L5 transforaminal ESI  Diagnostic bilateral lumbar facet block  Possible bilateral lumbar facet RFA  Diagnostic bilateral intra-articular knee joint injection with local anesthetic and steroid  Possible series of 5 bilateral intra-articular Hyalgan knee injections  Diagnostic bilateral genicular nerve block  Possible bilateral genicular nerve RFA    PRN Procedures:   None at this time     Provider-requested follow-up: Return in about 3 months (around 05/25/2018) for MedMgmt.  Future Appointments  Date Time Provider [place]  02/25/2018  8:00 AM Milinda Pointer, MD [facility] None  03/05/2018  8:00 AM Janna Arch, PT [facility] None  03/10/2018 11:15 AM Janna Arch, PT [facility] None  03/19/2018 10:30 AM Janna Arch, PT [facility] None  03/22/2018  1:00 PM Janna Arch, PT [facility] None  03/24/2018  1:00 PM Janna Arch, PT [facility] None  03/29/2018  4:00 PM Janna Arch, PT [facility] None  03/31/2018 11:15 AM Janna Arch, PT [facility] None  04/05/2018  1:00 PM Janna Arch, PT [facility] None  04/07/2018  1:00 PM Janna Arch, PT [facility] None  04/12/2018  1:00 PM Janna Arch, PT [facility] None  04/14/2018  1:00 PM Janna Arch, PT [facility] None  04/19/2018  1:00 PM Janna Arch, PT [facility] None  04/21/2018  1:00 PM  Janna Arch, PT [facility] None  04/26/2018  1:00 PM Janna Arch, PT [facility] None  04/28/2018  1:00 PM Janna Arch, PT [facility] None  05/03/2018  1:00 PM Janna Arch, PT [facility] None  05/05/2018  1:00 PM Janna Arch, PT [facility] None  05/10/2018  1:00 PM Janna Arch, PT [facility] None  05/12/2018  1:00 PM Janna Arch, PT [facility] None  05/18/2018  9:45 AM Vevelyn Francois, NP [facility] None   Primary Care Physician: Tracie Harrier, MD Location: [facility] Note by: Vevelyn Francois NP Date: 02/24/2018; Time: 4:02 PM  Pain Score Disclaimer: We use the NRS-11 scale. This is a self-reported, subjective measurement of pain severity with only modest accuracy. It is used primarily to identify changes within a particular patient. It must be understood that outpatient pain scales are significantly less accurate that those used for research, where they can be applied under ideal controlled circumstances with minimal exposure to variables. In reality, the score is likely to be a combination of pain intensity and pain affect, where pain affect describes the degree of emotional arousal or changes in action readiness caused by the sensory experience of pain. Factors such as social and work situation, setting, emotional state, anxiety levels, expectation, and prior pain experience may influence pain perception and show large inter-individual differences that may also be affected by time variables.  Patient instructions provided during this appointment: Patient Instructions  ____________________________________________________________________________________________  Medication Rules  Purpose: To inform patients, and their family members, of our rules and regulations.  Applies to: All patients receiving prescriptions (written or electronic).  Pharmacy of record: Pharmacy where electronic prescriptions will be sent. If written prescriptions are taken to a different  pharmacy, please inform the nursing staff. The pharmacy listed in the electronic  medical record should be the one where you would like electronic prescriptions to be sent.  Electronic prescriptions: In compliance with the [place] (STOP) Act of 2017 (Session Lanny Cramp [phone]), effective January 20, 2018, all controlled substances must be electronically prescribed. Calling prescriptions to the pharmacy will cease to exist.  Prescription refills: Only during scheduled appointments. Applies to all prescriptions.  NOTE: The following applies primarily to controlled substances (Opioid* Pain Medications).   Patient's responsibilities: 1. Pain Pills: Bring all pain pills to every appointment (except for procedure appointments). 2. Pill Bottles: Bring pills in original pharmacy bottle. Always bring the newest bottle. Bring bottle, even if empty. 3. Medication refills: You are responsible for knowing and keeping track of what medications you take and those you need refilled. The day before your appointment: write a list of all prescriptions that need to be refilled. The day of the appointment: give the list to the admitting nurse. Prescriptions will be written only during appointments. If you forget a medication: it will not be "Called in", "Faxed", or "electronically sent". You will need to get another appointment to get these prescribed. No early refills. Do not call asking to have your prescription filled early. 4. Prescription Accuracy: You are responsible for carefully inspecting your prescriptions before leaving our office. Have the discharge nurse carefully go over each prescription with you, before taking them home. Make sure that your name is accurately spelled, that your address is correct. Check the name and dose of your medication to make sure it is accurate. Check the number of pills, and the written instructions to make sure they are clear and accurate.  Make sure that you are given enough medication to last until your next medication refill appointment. 5. Taking Medication: Take medication as prescribed. When it comes to controlled substances, taking less pills or less frequently than prescribed is permitted and encouraged. Never take more pills than instructed. Never take medication more frequently than prescribed.  6. Inform other Doctors: Always inform, all of your healthcare providers, of all the medications you take. 7. Pain Medication from other Providers: You are not allowed to accept any additional pain medication from any other Doctor or Healthcare provider. There are two exceptions to this rule. (see below) In the event that you require additional pain medication, you are responsible for notifying us, as stated below. 8. Medication Agreement: You are responsible for carefully reading and following our Medication Agreement. This must be signed before receiving any prescriptions from our practice. Safely store a copy of your signed Agreement. Violations to the Agreement will result in no further prescriptions. (Additional copies of our Medication Agreement are available upon request.) 9. Laws, Rules, & Regulations: All patients are expected to follow all Federal and [employer], Rules, [employer]. Ignorance of the Laws does not constitute a valid excuse. The use of any illegal substances is prohibited. 10. Adopted CDC guidelines & recommendations: Target dosing levels will be at or below 60 MME/day. Use of benzodiazepines** is not recommended.  Exceptions: There are only two exceptions to the rule of not receiving pain medications from other Healthcare Providers. 1. Exception #1 (Emergencies): In the event of an emergency (i.e.: accident requiring emergency care), you are allowed to receive additional pain medication. However, you are responsible for: As soon as you are able, call our office [phone], at any time of the day or  night, and leave a message stating your name, the date and nature of the  emergency, and the name and dose of the medication prescribed. In the event that your call is answered by a member of our staff, make sure to document and save the date, time, and the name of the person that took your information.  2. Exception #2 (Planned Surgery): In the event that you are scheduled by another doctor or dentist to have any type of surgery or procedure, you are allowed (for a period no longer than 30 days), to receive additional pain medication, for the acute post-op pain. However, in this case, you are responsible for picking up a copy of our "Post-op Pain Management for Surgeons" handout, and giving it to your surgeon or dentist. This document is available at our office, and does not require an appointment to obtain it. Simply go to our office during business hours (Monday-Thursday from 8:00 AM to 4:00 PM) (Friday 8:00 AM to 12:00 Noon) or if you have a scheduled appointment with [place], prior to your surgery, and ask for it by name. In addition, you will need to provide [place] with your name, name of your surgeon, type of surgery, and date of procedure or surgery.  *Opioid medications include: morphine, codeine, oxycodone, oxymorphone, hydrocodone, hydromorphone, meperidine, tramadol, tapentadol, buprenorphine, fentanyl, methadone. **Benzodiazepine medications include: diazepam (Valium), alprazolam (Xanax), clonazepam (Klonopine), lorazepam (Ativan), clorazepate (Tranxene), chlordiazepoxide (Librium), estazolam (Prosom), oxazepam (Serax), temazepam (Restoril), triazolam (Halcion) (Last updated: 03/19/2017) ____________________________________________________________________________________________

## 2018-02-24 NOTE — Patient Instructions (Signed)
____________________________________________________________________________________________  Medication Rules  Purpose: To inform patients, and their family members, of our rules and regulations.  Applies to: All patients receiving prescriptions (written or electronic).  Pharmacy of record: Pharmacy where electronic prescriptions will be sent. If written prescriptions are taken to a different pharmacy, please inform the nursing staff. The pharmacy listed in the electronic medical record should be the one where you would like electronic prescriptions to be sent.  Electronic prescriptions: In compliance with the [place]rengthen Opioid Misuse Prevention (STOP) Act of 2017 (Session Law 2017-74/H243), effective January 20, 2018, all controlled substances must be electronically prescribed. Calling prescriptions to the pharmacy will cease to exist.  Prescription refills: Only during scheduled appointments. Applies to all prescriptions.  NOTE: The following applies primarily to controlled substances (Opioid* Pain Medications).   Patient's responsibilities: 1. Pain Pills: Bring all pain pills to every appointment (except for procedure appointments). 2. Pill Bottles: Bring pills in original pharmacy bottle. Always bring the newest bottle. Bring bottle, even if empty. 3. Medication refills: You are responsible for knowing and keeping track of what medications you take and those you need refilled. The day before your appointment: write a list of all prescriptions that need to be refilled. The day of the appointment: give the list to the admitting nurse. Prescriptions will be written only during appointments. If you forget a medication: it will not be "Called in", "Faxed", or "electronically sent". You will need to get another appointment to get these prescribed. No early refills. Do not call asking to have your prescription filled early. 4. Prescription Accuracy: You are responsible for  carefully inspecting your prescriptions before leaving our office. Have the discharge nurse carefully go over each prescription with you, before taking them home. Make sure that your name is accurately spelled, that your address is correct. Check the name and dose of your medication to make sure it is accurate. Check the number of pills, and the written instructions to make sure they are clear and accurate. Make sure that you are given enough medication to last until your next medication refill appointment. 5. Taking Medication: Take medication as prescribed. When it comes to controlled substances, taking less pills or less frequently than prescribed is permitted and encouraged. Never take more pills than instructed. Never take medication more frequently than prescribed.  6. Inform other Doctors: Always inform, all of your healthcare providers, of all the medications you take. 7. Pain Medication from other Providers: You are not allowed to accept any additional pain medication from any other Doctor or Healthcare provider. There are two exceptions to this rule. (see below) In the event that you require additional pain medication, you are responsible for notifying us, as stated below. 8. Medication Agreement: You are responsible for carefully reading and following our Medication Agreement. This must be signed before receiving any prescriptions from our practice. Safely store a copy of your signed Agreement. Violations to the Agreement will result in no further prescriptions. (Additional copies of our Medication Agreement are available upon request.) 9. Laws, Rules, & Regulations: All patients are expected to follow all Federal and State Laws, Statutes, Rules, & Regulations. Ignorance of the Laws does not constitute a valid excuse. The use of any illegal substances is prohibited. 10. Adopted CDC guidelines & recommendations: Target dosing levels will be at or below 60 MME/day. Use of benzodiazepines** is not  recommended.  Exceptions: There are only two exceptions to the rule of not receiving pain medications from other Healthcare Providers. 1.   Exception #1 (Emergencies): In the event of an emergency (i.e.: accident requiring emergency care), you are allowed to receive additional pain medication. However, you are responsible for: As soon as you are able, call our office [phone], at any time of the day or night, and leave a message stating your name, the date and nature of the emergency, and the name and dose of the medication prescribed. In the event that your call is answered by a member of our staff, make sure to document and save the date, time, and the name of the person that took your information.  2. Exception #2 (Planned Surgery): In the event that you are scheduled by another doctor or dentist to have any type of surgery or procedure, you are allowed (for a period no longer than 30 days), to receive additional pain medication, for the acute post-op pain. However, in this case, you are responsible for picking up a copy of our "Post-op Pain Management for Surgeons" handout, and giving it to your surgeon or dentist. This document is available at our office, and does not require an appointment to obtain it. Simply go to our office during business hours (Monday-Thursday from 8:00 AM to 4:00 PM) (Friday 8:00 AM to 12:00 Noon) or if you have a scheduled appointment with us, prior to your surgery, and ask for it by name. In addition, you will need to provide us with your name, name of your surgeon, type of surgery, and date of procedure or surgery.  *Opioid medications include: morphine, codeine, oxycodone, oxymorphone, hydrocodone, hydromorphone, meperidine, tramadol, tapentadol, buprenorphine, fentanyl, methadone. **Benzodiazepine medications include: diazepam (Valium), alprazolam (Xanax), clonazepam (Klonopine), lorazepam (Ativan), clorazepate (Tranxene), chlordiazepoxide (Librium), estazolam (Prosom),  oxazepam (Serax), temazepam (Restoril), triazolam (Halcion) (Last updated: 03/19/2017) ____________________________________________________________________________________________    

## 2018-02-24 NOTE — Progress Notes (Signed)
Nursing Pain Medication Assessment:  Safety precautions to be maintained throughout the outpatient stay will include: orient to surroundings, keep bed in low position, maintain call bell within reach at all times, provide assistance with transfer out of bed and ambulation.  Medication Inspection Compliance: Charlene [name] did not comply with our request to bring her pills to be counted. She was reminded that bringing the medication bottles, even when empty, is a requirement.  Medication: None brought in. Pill/Patch Count: None available to be counted. Bottle Appearance: No container available. Did not bring bottle(s) to appointment. Filled Date: N/A Last Medication intake:  Today

## 2018-02-25 ENCOUNTER — Encounter: Payer: Self-pay | Admitting: Pain Medicine

## 2018-02-25 ENCOUNTER — Ambulatory Visit
Admission: RE | Admit: 2018-02-25 | Discharge: 2018-02-25 | Disposition: A | Discharge status: Home / Self Care | Payer: BLUE CROSS/BLUE SHIELD | Source: Ambulatory Visit | Attending: Pain Medicine | Admitting: Pain Medicine

## 2018-02-25 ENCOUNTER — Ambulatory Visit (HOSPITAL_BASED_OUTPATIENT_CLINIC_OR_DEPARTMENT_OTHER): Payer: BLUE CROSS/BLUE SHIELD | Admitting: Pain Medicine

## 2018-02-25 ENCOUNTER — Other Ambulatory Visit: Payer: Self-pay

## 2018-02-25 VITALS — BP 150/84 | HR 85 | Temp 98.4°F | Resp 18 | Ht 59.0 in | Wt 115.0 lb

## 2018-02-25 DIAGNOSIS — M170 Bilateral primary osteoarthritis of knee: Secondary | ICD-10-CM

## 2018-02-25 DIAGNOSIS — M48061 Spinal stenosis, lumbar region without neurogenic claudication: Secondary | ICD-10-CM | POA: Diagnosis present

## 2018-02-25 DIAGNOSIS — M5136 Other intervertebral disc degeneration, lumbar region: Secondary | ICD-10-CM

## 2018-02-25 DIAGNOSIS — M25562 Pain in left knee: Secondary | ICD-10-CM | POA: Diagnosis present

## 2018-02-25 DIAGNOSIS — R202 Paresthesia of skin: Secondary | ICD-10-CM | POA: Diagnosis present

## 2018-02-25 DIAGNOSIS — G8929 Other chronic pain: Secondary | ICD-10-CM

## 2018-02-25 DIAGNOSIS — M79604 Pain in right leg: Secondary | ICD-10-CM

## 2018-02-25 DIAGNOSIS — M25561 Pain in right knee: Secondary | ICD-10-CM | POA: Insufficient documentation

## 2018-02-25 DIAGNOSIS — M79605 Pain in left leg: Secondary | ICD-10-CM

## 2018-02-25 DIAGNOSIS — R200 Anesthesia of skin: Secondary | ICD-10-CM

## 2018-02-25 DIAGNOSIS — M175 Other unilateral secondary osteoarthritis of knee: Secondary | ICD-10-CM | POA: Diagnosis not present

## 2018-02-25 MED ORDER — TRIAMCINOLONE ACETONIDE 40 MG/ML IJ SUSP
40.0000 mg | Freq: Once | INTRAMUSCULAR | Status: AC
  Administered 2018-02-25: 40 mg
  Filled 2018-02-25: qty 1

## 2018-02-25 MED ORDER — LIDOCAINE HCL (PF) 1 % IJ SOLN
5.0000 mL | Freq: Once | INTRAMUSCULAR | Status: AC
  Administered 2018-02-25: 5 mL
  Filled 2018-02-25: qty 5

## 2018-02-25 MED ORDER — SODIUM CHLORIDE 0.9% FLUSH
2.0000 mL | Freq: Once | INTRAVENOUS | Status: AC
  Administered 2018-02-25: 2 mL

## 2018-02-25 MED ORDER — FENTANYL CITRATE (PF) 100 MCG/2ML IJ SOLN
25.0000 ug | INTRAMUSCULAR | Status: DC | PRN
  Administered 2018-02-25: 50 ug via INTRAVENOUS
  Filled 2018-02-25: qty 2

## 2018-02-25 MED ORDER — SODIUM HYALURONATE (VISCOSUP) 20 MG/2ML IX SOSY
2.0000 mL | PREFILLED_SYRINGE | Freq: Once | INTRA_ARTICULAR | Status: AC
  Administered 2018-02-25: 2 mL via INTRA_ARTICULAR

## 2018-02-25 MED ORDER — LIDOCAINE HCL 2 % IJ SOLN
20.0000 mL | Freq: Once | INTRAMUSCULAR | Status: AC
  Administered 2018-02-25: 400 mg
  Filled 2018-02-25: qty 20

## 2018-02-25 MED ORDER — LACTATED RINGERS IV SOLN
1000.0000 mL | Freq: Once | INTRAVENOUS | Status: AC
  Administered 2018-02-25: 1000 mL via INTRAVENOUS

## 2018-02-25 MED ORDER — ROPIVACAINE HCL 2 MG/ML IJ SOLN
2.0000 mL | Freq: Once | INTRAMUSCULAR | Status: AC
  Administered 2018-02-25: 2 mL via EPIDURAL
  Filled 2018-02-25: qty 10

## 2018-02-25 MED ORDER — ROPIVACAINE HCL 2 MG/ML IJ SOLN
5.0000 mL | Freq: Once | INTRAMUSCULAR | Status: AC
  Administered 2018-02-25: 5 mL via INTRA_ARTICULAR
  Filled 2018-02-25: qty 10

## 2018-02-25 MED ORDER — MIDAZOLAM HCL 5 MG/5ML IJ SOLN
1.0000 mg | INTRAMUSCULAR | Status: DC | PRN
  Administered 2018-02-25: 2 mg via INTRAVENOUS
  Filled 2018-02-25: qty 5

## 2018-02-25 MED ORDER — IOPAMIDOL (ISOVUE-M 200) INJECTION 41%
10.0000 mL | Freq: Once | INTRAMUSCULAR | Status: AC
  Administered 2018-02-25: 10 mL via EPIDURAL
  Filled 2018-02-25: qty 10

## 2018-02-25 MED ORDER — SODIUM CHLORIDE (PF) 0.9 % IJ SOLN
INTRAMUSCULAR | Status: AC
  Filled 2018-02-25: qty 10

## 2018-02-25 NOTE — Progress Notes (Signed)
Patient's Name: Charlene [name]  MRN: [id number]  Referring Provider: Tracie Harrier, MD  DOB: Nov 21, 1960  PCP: Tracie Harrier, MD  DOS: 02/25/2018  Note by: Gaspar Cola, MD  Service setting: Ambulatory outpatient  Specialty: Interventional Pain Management  Patient type: Established  Location: [facility] (AMB) Pain Management Facility  Visit type: Interventional Procedure   Primary Reason for Visit: Interventional Pain Management Treatment. CC: Knee Pain (bilateral) and Back Pain (low)  Procedure #1:  Anesthesia, Analgesia, Anxiolysis:  Type: Therapeutic Intra-Articular Hyalgan Knee Injection #5  Region: Lateral infrapatellar Knee Region Level: Knee Joint Laterality: Bilateral  Type: Local Anesthesia Indication(s): Analgesia         Local Anesthetic: Lidocaine 1-2% Route: Infiltration ([place]/IM) IV Access: Declined Sedation: Declined   Position: Sitting   Indications: 1. Osteoarthritis of knee (Bilateral)   2. Chronic knee pain (Fourth Area of Pain) (Bilateral) (L>R)    Procedure #2:  Anesthesia, Analgesia, Anxiolysis:  Type: Diagnostic Inter-Laminar Epidural Steroid Injection  #1  Region: Lumbar Level: L5-S1 Level. Laterality: Left-Sided Paramedial  Type: Moderate (Conscious) Sedation combined with Local Anesthesia Indication(s): Analgesia and Anxiety Route: Intravenous (IV) IV Access: Secured Sedation: Meaningful verbal contact was maintained at all times during the procedure  Local Anesthetic: Lidocaine 1-2%  Position: Prone with head of the table was raised to facilitate breathing.   Indications: 1. DDD (degenerative disc disease), lumbar   2. Lower extremity numbness and tingling (Left)   3. Chronic lower extremity pain (Secondary Area of Pain) (Bilateral) (L>R)   4. Lumbar foraminal stenosis (L5-S1) (Right)    Pain Score: Pre-procedure: 1 /10 Post-procedure: 0-No pain/10  Pre-op Assessment:  [name] is a 58 y.o. (year old), female patient, seen today for  interventional treatment. She  has a past surgical history that includes arthroscopic rotator cuff; Colonoscopy; Abdominal hysterectomy; endosopic sinus; and Cataract extraction w/PHACO (Right, 11/25/2017). Charlene [name] has a current medication list which includes the following prescription(s): amlodipine, baclofen, gnp calcium 1200, vitamin d3, duloxetine, gabapentin, insulin glargine (2 unit dial), lidocaine-menthol, liraglutide, lisinopril, magnesium, meloxicam, metoprolol succinate, pantoprazole, pantoprazole, polyethylene glycol, tramadol, and amlodipine, and the following Facility-Administered Medications: fentanyl and midazolam. Her primarily concern today is the Knee Pain (bilateral) and Back Pain (low)  Initial Vital Signs:  Pulse/HCG Rate: 85ECG Heart Rate: 78 Temp: (!) 97.4 F (36.3 C) Resp: 16 BP: (!) 172/93 SpO2: 100 %  BMI: Estimated body mass index is 23.23 kg/m as calculated from the following:   Height as of this encounter: 4\' 11"  (1.499 m).   Weight as of this encounter: 115 lb (52.2 kg).  Risk Assessment: Allergies: Reviewed. She has No Known Allergies.  Allergy Precautions: None required Coagulopathies: Reviewed. None identified.  Blood-thinner therapy: None at this time Active Infection(s): Reviewed. None identified. Charlene [name] is afebrile  Site Confirmation: Charlene [name] was asked to confirm the procedure and laterality before marking the site Procedure checklist: Completed Consent: Before the procedure and under the influence of no sedative(s), amnesic(s), or anxiolytics, the patient was informed of the treatment options, risks and possible complications. To fulfill our ethical and legal obligations, as recommended by the American Medical Association's Code of Ethics, I have informed the patient of my clinical impression; the nature and purpose of the treatment or procedure; the risks, benefits, and possible complications of the intervention; the alternatives, including  doing nothing; the risk(s) and benefit(s) of the alternative treatment(s) or procedure(s); and the risk(s) and benefit(s) of doing nothing. The patient was provided information about the general  risks and possible complications associated with the procedure. These may include, but are not limited to: failure to achieve desired goals, infection, bleeding, organ or nerve damage, allergic reactions, paralysis, and death. In addition, the patient was informed of those risks and complications associated to the procedure, such as failure to decrease pain; infection; bleeding; organ or nerve damage with subsequent damage to sensory, motor, and/or autonomic systems, resulting in permanent pain, numbness, and/or weakness of one or several areas of the body; allergic reactions; (i.e.: anaphylactic reaction); and/or death. Furthermore, the patient was informed of those risks and complications associated with the medications. These include, but are not limited to: allergic reactions (i.e.: anaphylactic or anaphylactoid reaction(s)); adrenal axis suppression; blood sugar elevation that in diabetics may result in ketoacidosis or comma; water retention that in patients with history of congestive heart failure may result in shortness of breath, pulmonary edema, and decompensation with resultant heart failure; weight gain; swelling or edema; medication-induced neural toxicity; particulate matter embolism and blood vessel occlusion with resultant organ, and/or nervous system infarction; and/or aseptic necrosis of one or more joints. Finally, the patient was informed that Medicine is not an exact science; therefore, there is also the possibility of unforeseen or unpredictable risks and/or possible complications that may result in a catastrophic outcome. The patient indicated having understood very clearly. We have given the patient no guarantees and we have made no promises. Enough time was given to the patient to ask questions,  all of which were answered to the patient's satisfaction. Charlene [name] has indicated that she wanted to continue with the procedure. Attestation: I, the ordering provider, attest that I have discussed with the patient the benefits, risks, side-effects, alternatives, likelihood of achieving goals, and potential problems during recovery for the procedure that I have provided informed consent. Date  Time: 02/25/2018  8:12 AM  Pre-Procedure Preparation:  Monitoring: As per clinic protocol. Respiration, ETCO2, SpO2, BP, heart rate and rhythm monitor placed and checked for adequate function Safety Precautions: Patient was assessed for positional comfort and pressure points before starting the procedure. Time-out: I initiated and conducted the "Time-out" before starting the procedure, as per protocol. The patient was asked to participate by confirming the accuracy of the "Time Out" information. Verification of the correct person, site, and procedure were performed and confirmed by me, the nursing staff, and the patient. "Time-out" conducted as per Joint Commission's Universal Protocol (UP.01.01.01). Time: 0917  Description of Procedure #1:  Target Area: Knee Joint Approach: Just above the Lateral tibial plateau, lateral to the infrapatellar tendon. Area Prepped: Entire knee area, from the mid-thigh to the mid-shin. Prepping solution: ChloraPrep (2% chlorhexidine gluconate and 70% isopropyl alcohol) Safety Precautions: Aspiration looking for blood return was conducted prior to all injections. At no point did we inject any substances, as a needle was being advanced. No attempts were made at seeking any paresthesias. Safe injection practices and needle disposal techniques used. Medications properly checked for expiration dates. SDV (single dose vial) medications used. Description of the Procedure: Protocol guidelines were followed. The patient was placed in position over the fluoroscopy table. The target area was  identified and the area prepped in the usual manner. Skin & deeper tissues infiltrated with local anesthetic. Appropriate amount of time allowed to pass for local anesthetics to take effect. The procedure needles were then advanced to the target area. Proper needle placement secured. Negative aspiration confirmed. Solution injected in intermittent fashion, asking for systemic symptoms every 0.5cc of injectate. The needles were then  removed and the area cleansed, making sure to leave some of the prepping solution back to take advantage of its long term bactericidal properties. Vitals:   02/25/18 0923 02/25/18 0933 02/25/18 0943 02/25/18 0953  BP: (!) 161/93 (!) 161/80 (!) 157/76 (!) 150/84  Pulse:      Resp: 10 15 16 18   Temp:    98.4 F (36.9 C)  TempSrc:    Oral  SpO2: 100% 100% 100% 100%  Weight:      Height:        Start Time: 0917 hrs. End Time: 0923 hrs. Materials:  Needle(s) Type: Regular needle Gauge: 25G Length: 1.5-in Medication(s): Please see orders for medications and dosing details.  Imaging Guidance for procedure #1:  Type of Imaging Technique: None used Indication(s): N/A Exposure Time: No patient exposure Contrast: None used. Fluoroscopic Guidance: N/A Ultrasound Guidance: N/A Interpretation: N/A  Description of Procedure #2:  Target Area: The interlaminar space, initially targeting the lower laminar border of the superior vertebral body. Approach: Paramedial approach. Area Prepped: Entire Posterior Lumbar Region Prepping solution: ChloraPrep (2% chlorhexidine gluconate and 70% isopropyl alcohol) Safety Precautions: Aspiration looking for blood return was conducted prior to all injections. At no point did we inject any substances, as a needle was being advanced. No attempts were made at seeking any paresthesias. Safe injection practices and needle disposal techniques used. Medications properly checked for expiration dates. SDV (single dose vial) medications  used. Description of the Procedure: Protocol guidelines were followed. The procedure needle was introduced through the skin, ipsilateral to the reported pain, and advanced to the target area. Bone was contacted and the needle walked caudad, until the lamina was cleared. The epidural space was identified using "loss-of-resistance technique" with 2-3 ml of PF-NaCl (0.9% NSS), in a 5cc LOR glass syringe.  Vitals:   02/25/18 0923 02/25/18 0933 02/25/18 0943 02/25/18 0953  BP: (!) 161/93 (!) 161/80 (!) 157/76 (!) 150/84  Pulse:      Resp: 10 15 16 18   Temp:    98.4 F (36.9 C)  TempSrc:    Oral  SpO2: 100% 100% 100% 100%  Weight:      Height:        Start Time: 0917 hrs. End Time: 0923 hrs.  Materials:  Needle(s) Type: Epidural needle Gauge: 17G Length: 3.5-in Medication(s): Please see orders for medications and dosing details.  Imaging Guidance (Spinal) for procedure #2:  Type of Imaging Technique: Fluoroscopy Guidance (Spinal) Indication(s): Assistance in needle guidance and placement for procedures requiring needle placement in or near specific anatomical locations not easily accessible without such assistance. Exposure Time: Please see nurses notes. Contrast: Before injecting any contrast, we confirmed that the patient did not have an allergy to iodine, shellfish, or radiological contrast. Once satisfactory needle placement was completed at the desired level, radiological contrast was injected. Contrast injected under live fluoroscopy. No contrast complications. See chart for type and volume of contrast used. Fluoroscopic Guidance: I was personally present during the use of fluoroscopy. "Tunnel Vision Technique" used to obtain the best possible view of the target area. Parallax error corrected before commencing the procedure. "Direction-depth-direction" technique used to introduce the needle under continuous pulsed fluoroscopy. Once target was reached, antero-posterior, oblique, and  lateral fluoroscopic projection used confirm needle placement in all planes. Images permanently stored in EMR. Interpretation: I personally interpreted the imaging intraoperatively. Adequate needle placement confirmed in multiple planes. Appropriate spread of contrast into desired area was observed. No evidence of afferent or efferent intravascular  uptake. No intrathecal or subarachnoid spread observed. Permanent images saved into the patient's record.  Antibiotic Prophylaxis:   Anti-infectives (From admission, onward)   None     Indication(s): None identified  Post-operative Assessment:  Post-procedure Vital Signs:  Pulse/HCG Rate: 8576 Temp: 98.4 F (36.9 C) Resp: 18 BP: (!) 150/84 SpO2: 100 %  EBL: None  Complications: No immediate post-treatment complications observed by team, or reported by patient.  Note: The patient tolerated the entire procedure well. A repeat set of vitals were taken after the procedure and the patient was kept under observation following institutional policy, for this type of procedure. Post-procedural neurological assessment was performed, showing return to baseline, prior to discharge. The patient was provided with post-procedure discharge instructions, including a section on how to identify potential problems. Should any problems arise concerning this procedure, the patient was given instructions to immediately contact us, at any time, without hesitation. In any case, we plan to contact the patient by telephone for a follow-up status report regarding this interventional procedure.  Comments:  No additional relevant information.  Plan of Care    Imaging Orders     DG C-Arm 1-60 Min-No Report  Procedure Orders     KNEE INJECTION     Lumbar Epidural Injection  Medications ordered for procedure: Meds ordered this encounter  Medications  . lidocaine (PF) (XYLOCAINE) 1 % injection 5 mL  . ropivacaine (PF) 2 mg/mL (0.2%) (NAROPIN) injection 5 mL  .  Sodium Hyaluronate SOSY 2 mL  . Sodium Hyaluronate SOSY 2 mL  . iopamidol (ISOVUE-M) 41 % intrathecal injection 10 mL    Must be Myelogram-compatible. If not available, you may substitute with a water-soluble, non-ionic, hypoallergenic, myelogram-compatible radiological contrast medium.  Marland Kitchen lidocaine (XYLOCAINE) 2 % (with pres) injection 400 mg  . midazolam (VERSED) 5 MG/5ML injection 1-2 mg    Make sure Flumazenil is available in the pyxis when using this medication. If oversedation occurs, administer 0.2 mg IV over 15 sec. If after 45 sec no response, administer 0.2 mg again over 1 min; may repeat at 1 min intervals; not to exceed 4 doses (1 mg)  . fentaNYL (SUBLIMAZE) injection 25-50 mcg    Make sure Narcan is available in the pyxis when using this medication. In the event of respiratory depression (RR< 8/min): Titrate NARCAN (naloxone) in increments of 0.1 to 0.2 mg IV at 2-3 minute intervals, until desired degree of reversal.  . lactated ringers infusion 1,000 mL  . sodium chloride flush (NS) 0.9 % injection 2 mL  . ropivacaine (PF) 2 mg/mL (0.2%) (NAROPIN) injection 2 mL  . triamcinolone acetonide (KENALOG-40) injection 40 mg   Medications administered: We administered lidocaine (PF), ropivacaine (PF) 2 mg/mL (0.2%), Sodium Hyaluronate, Sodium Hyaluronate, iopamidol, lidocaine, midazolam, fentaNYL, lactated ringers, sodium chloride flush, ropivacaine (PF) 2 mg/mL (0.2%), and triamcinolone acetonide.  See the medical record for exact dosing, route, and time of administration.  Disposition: Discharge home  Discharge Date & Time: 02/25/2018; 0955 hrs.   Physician-requested Follow-up: Return for post-procedure eval (2 wks), w/ Dr. Dossie Arbour.  Future Appointments  Date Time Provider [place]  03/05/2018  8:00 AM Janna Arch, PT [facility] None  03/10/2018 11:15 AM Janna Arch, PT [facility] None  03/15/2018  2:00 PM Milinda Pointer, MD [facility] None  03/19/2018 10:30 AM Janna Arch, PT [facility] None  03/22/2018  1:00 PM Janna Arch, PT [facility] None  03/24/2018  1:00 PM Janna Arch, PT [facility] None  03/29/2018  4:00 PM Ermalene Postin,  Marina, PT [facility] None  03/31/2018 11:15 AM Janna Arch, PT [facility] None  04/05/2018  1:00 PM Janna Arch, PT [facility] None  04/07/2018  1:00 PM Janna Arch, PT [facility] None  04/12/2018  1:00 PM Janna Arch, PT [facility] None  04/14/2018  1:00 PM Janna Arch, PT [facility] None  04/19/2018  1:00 PM Janna Arch, PT [facility] None  04/21/2018  1:00 PM Janna Arch, PT [facility] None  04/26/2018  1:00 PM Janna Arch, PT [facility] None  04/28/2018  1:00 PM Janna Arch, PT [facility] None  05/03/2018  1:00 PM Janna Arch, PT [facility] None  05/05/2018  1:00 PM Janna Arch, PT [facility] None  05/10/2018  1:00 PM Janna Arch, PT [facility] None  05/12/2018  1:00 PM Janna Arch, PT [facility] None  05/18/2018  9:45 AM Vevelyn Francois, NP [facility] None   Primary Care Physician: Tracie Harrier, MD Location: [facility] Note by: Gaspar Cola, MD Date: 02/25/2018; Time: 10:08 AM  Disclaimer:  Medicine is not an Chief Strategy Officer. The only guarantee in medicine is that nothing is guaranteed. It is important to note that the decision to proceed with this intervention was based on the information collected from the patient. The Data and conclusions were drawn from the patient's questionnaire, the interview, and the physical examination. Because the information was provided in large part by the patient, it cannot be guaranteed that it has not been purposely or unconsciously manipulated. Every effort has been made to obtain as much relevant data as possible for this evaluation. It is important to note that the conclusions that lead to this procedure are derived in large part from the available data. Always take into account that the treatment will also be dependent on availability of resources and existing  treatment guidelines, considered by other Pain Management Practitioners as being common knowledge and practice, at the time of the intervention. For Medico-Legal purposes, it is also important to point out that variation in procedural techniques and pharmacological choices are the acceptable norm. The indications, contraindications, technique, and results of the above procedure should only be interpreted and judged by a Board-Certified Interventional Pain Specialist with extensive familiarity and expertise in the same exact procedure and technique.

## 2018-02-25 NOTE — Patient Instructions (Addendum)
____________________________________________________________________________________________  Post-Procedure Discharge Instructions  Instructions:  Apply ice: Fill a plastic sandwich bag with crushed ice. Cover it with a small towel and apply to injection site. Apply for 15 minutes then remove x 15 minutes. Repeat sequence on day of procedure, until you go to bed. The purpose is to minimize swelling and discomfort after procedure.  Apply heat: Apply heat to procedure site starting the day following the procedure. The purpose is to treat any soreness and discomfort from the procedure.  Food intake: Start with clear liquids (like water) and advance to regular food, as tolerated.   Physical activities: Keep activities to a minimum for the first 8 hours after the procedure.   Driving: If you have received any sedation, you are not allowed to drive for 24 hours after your procedure.  Blood thinner: Restart your blood thinner 6 hours after your procedure. (Only for those taking blood thinners)  Insulin: As soon as you can eat, you may resume your normal dosing schedule. (Only for those taking insulin)  Infection prevention: Keep procedure site clean and dry.  Post-procedure Pain Diary: Extremely important that this be done correctly and accurately. Recorded information will be used to determine the next step in treatment.  Pain evaluated is that of treated area only. Do not include pain from an untreated area.  Complete every hour, on the hour, for the initial 8 hours. Set an alarm to help you do this part accurately.  Do not go to sleep and have it completed later. It will not be accurate.  Follow-up appointment: Keep your follow-up appointment after the procedure. Usually 2 weeks for most procedures. (6 weeks in the case of radiofrequency.) Bring you pain diary.   Expect:  From numbing medicine (AKA: Local Anesthetics): Numbness or decrease in pain.  Onset: Full effect within 15  minutes of injected.  Duration: It will depend on the type of local anesthetic used. On the average, 1 to 8 hours.   From steroids: Decrease in swelling or inflammation. Once inflammation is improved, relief of the pain will follow.  Onset of benefits: Depends on the amount of swelling present. The more swelling, the longer it will take for the benefits to be seen. In some cases, up to 10 days.  Duration: Steroids will stay in the system x 2 weeks. Duration of benefits will depend on multiple posibilities including persistent irritating factors.  Occasional side-effects: Facial flushing (red, warm cheeks) , cramps (if present, drink Gatorade and take over-the-counter Magnesium 450-500 mg once to twice a day).  From procedure: Some discomfort is to be expected once the numbing medicine wears off. This should be minimal if ice and heat are applied as instructed.  Call if:  You experience numbness and weakness that gets worse with time, as opposed to wearing off.  New onset bowel or bladder incontinence. (This applies to Spinal procedures only)  Emergency Numbers:  Durning business hours (Monday - Thursday, 8:00 AM - 4:00 PM) (Friday, 9:00 AM - 12:00 Noon): [phone]  After hours: [phone] ____________________________________________________________________________________________   Epidural Steroid Injection Patient Information  Description: The epidural space surrounds the nerves as they exit the spinal cord.  In some patients, the nerves can be compressed and inflamed by a bulging disc or a tight spinal canal (spinal stenosis).  By injecting steroids into the epidural space, we can bring irritated nerves into direct contact with a potentially helpful medication.  These steroids act directly on the irritated nerves and can reduce  swelling and inflammation which often leads to decreased pain.  Epidural steroids may be injected anywhere along the spine and from the neck to the  low back depending upon the location of your pain.   After numbing the skin with local anesthetic (like Novocaine), a small needle is passed into the epidural space slowly.  You may experience a sensation of pressure while this is being done.  The entire block usually last less than 10 minutes.  Conditions which may be treated by epidural steroids:   Low back and leg pain  Neck and arm pain  Spinal stenosis  Post-laminectomy syndrome  Herpes zoster (shingles) pain  Pain from compression fractures  Preparation for the injection:  1. Do not eat any solid food or dairy products within 8 hours of your appointment.  2. You may drink clear liquids up to 3 hours before appointment.  Clear liquids include water, black coffee, juice or soda.  No milk or cream please. 3. You may take your regular medication, including pain medications, with a sip of water before your appointment  Diabetics should hold regular insulin (if taken separately) and take 1/2 normal NPH dos the morning of the procedure.  Carry some sugar containing items with you to your appointment. 4. A driver must accompany you and be prepared to drive you home after your procedure.  5. Bring all your current medications with your. 6. An IV may be inserted and sedation may be given at the discretion of the physician.   7. A blood pressure cuff, EKG and other monitors will often be applied during the procedure.  Some patients may need to have extra oxygen administered for a short period. 8. You will be asked to provide medical information, including your allergies, prior to the procedure.  We must know immediately if you are taking blood thinners (like Coumadin/Warfarin)  Or if you are allergic to IV iodine contrast (dye). We must know if you could possible be pregnant.  Possible side-effects:  Bleeding from needle site  Infection (rare, may require surgery)  Nerve injury (rare)  Numbness & tingling (temporary)  Difficulty  urinating (rare, temporary)  Spinal headache ( a headache worse with upright posture)  Light -headedness (temporary)  Pain at injection site (several days)  Decreased blood pressure (temporary)  Weakness in arm/leg (temporary)  Pressure sensation in back/neck (temporary)  Call if you experience:  Fever/chills associated with headache or increased back/neck pain.  Headache worsened by an upright position.  New onset weakness or numbness of an extremity below the injection site  Hives or difficulty breathing (go to the emergency room)  Inflammation or drainage at the infection site  Severe back/neck pain  Any new symptoms which are concerning to you  Please note:  Although the local anesthetic injected can often make your back or neck feel good for several hours after the injection, the pain will likely return.  It takes 3-7 days for steroids to work in the epidural space.  You may not notice any pain relief for at least that one week.  If effective, we will often do a series of three injections spaced 3-6 weeks apart to maximally decrease your pain.  After the initial series, we generally will wait several months before considering a repeat injection of the same type.  If you have any questions, please call [phone] [facility]  General Discharge Instructions :  If you need to reach your doctor call:  Monday-Friday 8:00 am - 4:00 pm at [phone] or toll free [phone].  After clinic hours [phone] to have operator reach doctor.  Bring all of your medication bottles to all your appointments in the pain clinic.  To cancel or reschedule your appointment with Pain Management please remember to call 24 hours in advance to avoid a fee.  Refer to the educational materials which you have been given on: General Risks, I had my Procedure. Discharge Instructions, Post Sedation.  Post Procedure  Instructions:  The drugs you were given will stay in your system until tomorrow, so for the next 24 hours you should not drive, make any legal decisions or drink any alcoholic beverages.  You may eat anything you prefer, but it is better to start with liquids then soups and crackers, and gradually work up to solid foods.  Please notify your doctor immediately if you have any unusual bleeding, trouble breathing or pain that is not related to your normal pain.  Depending on the type of procedure that was done, some parts of your body may feel week and/or numb.  This usually clears up by tonight or the next day.  Walk with the use of an assistive device or accompanied by an adult for the 24 hours.  You may use ice on the affected area for the first 24 hours.  Put ice in a Ziploc bag and cover with a towel and place against area 15 minutes on 15 minutes off.  You may switch to heat after 24 hours.

## 2018-02-25 NOTE — Progress Notes (Signed)
Safety precautions to be maintained throughout the outpatient stay will include: orient to surroundings, keep bed in low position, maintain call bell within reach at all times, provide assistance with transfer out of bed and ambulation.  

## 2018-02-26 ENCOUNTER — Telehealth: Payer: Self-pay

## 2018-02-26 NOTE — Telephone Encounter (Signed)
Post procedure phone call.  LM 

## 2018-03-05 ENCOUNTER — Ambulatory Visit: Payer: BLUE CROSS/BLUE SHIELD

## 2018-03-10 ENCOUNTER — Ambulatory Visit: Payer: BLUE CROSS/BLUE SHIELD | Attending: Neurology

## 2018-03-10 ENCOUNTER — Other Ambulatory Visit: Payer: Self-pay

## 2018-03-10 ENCOUNTER — Ambulatory Visit: Payer: BLUE CROSS/BLUE SHIELD

## 2018-03-10 DIAGNOSIS — M6281 Muscle weakness (generalized): Secondary | ICD-10-CM | POA: Diagnosis not present

## 2018-03-10 DIAGNOSIS — R2689 Other abnormalities of gait and mobility: Secondary | ICD-10-CM | POA: Diagnosis present

## 2018-03-10 DIAGNOSIS — M79605 Pain in left leg: Secondary | ICD-10-CM | POA: Diagnosis present

## 2018-03-10 NOTE — Therapy (Addendum)
[place] MAIN [facility] SERVICES [address] [place], [name], [address] Phone: [phone]   Fax:  [phone]  Physical Therapy Evaluation  Patient Details  Name: Charlene [name] MRN: [id number] Date of Birth: 09-21-1960 Referring Provider (PT): Dr. Gurney Maxin   Encounter Date: 03/10/2018  PT End of Session - 03/10/18 1523    Date for PT Re-Evaluation  05/05/18    Authorization - Number of Visits  8    PT Start Time  1409    PT Stop Time  1505    PT Time Calculation (min)  56 min    Equipment Utilized During Treatment  Gait belt    Activity Tolerance  Patient tolerated treatment well    Behavior During Therapy  [facility] for tasks assessed/performed       Past Medical History:  Diagnosis Date  . Arthritis    knees  . Degenerative disc disease, lumbar   . Diabetes mellitus without complication ([place])   . GERD (gastroesophageal reflux disease)   . Hypertension   . Neuropathy    left lower leg  . Polyneuropathy   . Polyneuropathy     Past Surgical History:  Procedure Laterality Date  . ABDOMINAL HYSTERECTOMY    . arthroscopic rotator cuff    . CATARACT EXTRACTION W/PHACO Right 11/25/2017   Procedure: CATARACT EXTRACTION PHACO AND INTRAOCULAR LENS PLACEMENT ([place]) RIGHT DIABETIC;  Surgeon: Leandrew Koyanagi, MD;  Location: [place];  Service: Ophthalmology;  Laterality: Right;  Diabetic - insulin and oral meds  . COLONOSCOPY    . endosopic sinus      There were no vitals filed for this visit.   Subjective Assessment - 03/10/18 1421    Subjective  The patient reports that she is doing OK today. She states that she is going to her grandson's basketball game this Saturday. She notes that she would prefer to have PT sessions 1x/week due to her high co-pay.    Pertinent History  The patient presents to PT with a medical diagnosis of severe diabetic LLE neuropathy that began approximately 1 year ago. She has a PMH of Type 2  Diabetes and Hypertension. Patient reports that upon onset, she had trouble walking and began to limp, and notes that it has gotten progressively worse over the past year. She rates her current pain at rest at 2/10 and states that it is constant, and that she finds occasional relief with rest, heat, and stretching. She describes her pain as burning, tingling, numbness, and swelling. Her pain level increases to 4/10 with walking and physical activity, and gets up to 5/10 pain at worst. She also reports a recent onset of L knee and hip pain unrelated to her neuropathy. 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 She notes that when she walks, her L foot drags which causes her to stumble. In the past 6 months she has had 2 falls, one walking out of church and one at [place]. She attributes her fall at church to her LLE giving out/becoming weak, and her fall at [facility] to her stumbling and losing her balance. She currently has to hold onto things in the shower so that she does not lose her balance. She uses a single point cane at home and in the community for mobility. Of note, she  received cataract surgery in November, and notes that she typically wears glasses.     Limitations  Lifting;Standing;Walking;House hold activities    How long can you sit comfortably?  2 hours    How long can you stand comfortably?  30 minutes    How long can you walk comfortably?  45 minutes    Patient Stated Goals  be able to walk longer distances, improve balance, to be able to shop, negotiate stairs without assistance    Currently in Pain?  Yes    Pain Score  2     Pain Location  Leg    Pain Orientation  Left    Pain Descriptors / Indicators  Burning;Numbness;Tingling    Pain Type  Neuropathic pain    Pain Onset  More than a month ago   approximately 1 year   Pain  Frequency  Constant    Aggravating Factors   prolonged standing/walking    Pain Relieving Factors  heat, rest, occasionally stretching    Effect of Pain on Daily Activities  inability to stand/walk for long durations, impaired balance and gait         [facility] PT Assessment - 03/10/18 1641      Assessment   Medical Diagnosis  Severe diabetic neuropathy LLE    Referring Provider (PT)  Dr. Gurney Maxin    Onset Date/Surgical Date  --   approximately 1 year ago   Hand Dominance  Left    Next MD Visit  Pain management on 2/24    Prior Therapy  physical therapy at [facility] in [place], December 2019      Precautions   Precautions  None    Required Braces or Orthoses  --   none     Restrictions   Weight Bearing Restrictions  No      Balance Screen   Has the patient fallen in the past 6 months  Yes    How many times?  2    Has the patient had a decrease in activity level because of a fear of falling?   Yes    Is the patient reluctant to leave their home because of a fear of falling?   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/day, physical labor    Leisure  shopping, music and TV      Cognition   Overall Cognitive Status  Within Functional Limits for tasks assessed   notes occasional memory issues for smaller things       PAIN: Currently 2/10 (constant, at rest) Occasionally better than 2/10, but pain is constant 4/10 with physical activity/ambulation 5/10 at  worst   POSTURE: Minimal forward head rounded shoulders in standing and sitting Decreased weight acceptance onto LLE in standing; also minimally flexed trunk Upright, midline posture in sitting  PROM/AROM: AROM observably grossly WFL for all joints of BUE and BLE  STRENGTH:  Graded on a 0-5 scale Muscle Group Left Right  Hip Flex 3+/5* 4/5  Hip Abd 4-/5* 4/5  Hip Add 3+/5 4/5  Knee Flex 3/5 4/5  Knee Ext 4-/5* 5/5  Ankle DF 2+/5 4/5  Ankle PF (seated) 3+/5 4/5  * indicates painful  SENSATION: Light touch WFL and intact bilaterally  FUNCTIONAL MOBILITY: Performs sit to stand and stand to sit with use of BUE, limited weight shift over LLE (preforms mostly with RLE)  Coordination: Heel to shin test: WFL RLE, decreased speed and coordination LLE Finger to Nose test: Decreased speed bilaterally (L slower than R), issues with accuracy (likely related to vision as patient not wearing glasses today)  BALANCE:  Static Sitting Balance  Normal Able to maintain balance against maximal resistance   Good Able to maintain balance against moderate resistance   Good-/Fair+ Accepts minimal resistance x  Fair Able to sit unsupported without balance loss and without UE support   Poor+ Able to maintain with Minimal assistance from individual or chair   Poor Unable to maintain balance-requires mod/max support from individual or chair    Static Standing Balance  Normal Able to maintain standing balance against maximal resistance   Good Able to maintain standing balance against moderate resistance   Good-/Fair+ Able to maintain standing balance against minimal resistance   Fair Able to stand unsupported without UE support and without LOB for 1-2 min x  Fair- Requires Min A and UE support to maintain standing without loss of balance   Poor+ Requires mod A and UE support to maintain standing without loss of balance   Poor Requires max A and UE support to maintain standing balance without  loss    Dynamic Sitting Balance  Normal Able to sit unsupported and weight shift across midline maximally   Good Able to sit unsupported and weight shift across midline moderately   Good-/Fair+ Able to sit unsupported and weight shift across midline minimally   Fair Minimal weight shifting ipsilateral/front, difficulty crossing midline x  Fair- Reach to ipsilateral side and unable to weight shift   Poor + Able to sit unsupported with min A and reach to ipsilateral side, unable to weight shift   Poor Able to sit unsupported with mod A and reach ipsilateral/front-can't cross midline    Standing Dynamic Balance  Normal Stand independently unsupported, able to weight shift and cross midline maximally   Good Stand independently unsupported, able to weight shift and cross midline moderately   Good-/Fair+ Stand independently unsupported, able to weight shift across midline minimally   Fair Stand independently unsupported, weight shift, and reach ipsilaterally, loss of balance when crossing midline x  Poor+ Able to stand with Min A and reach ipsilaterally, unable to weight shift   Poor Able to stand with Mod A and minimally reach ipsilaterally, unable to cross midline.     GAIT: Ambulates with use of SPC and CGA Decreased weight acceptance onto LLE Increased L knee flexion during stance, decreased L knee flexion during swing  Minimally increased trunk flexion throughout Decreased L dorsiflexion/foot clearance during swing Decreased heel strike LLE, normal heel strike RLE Decreased step length (L less than R) Decreased gait speed (<1.0 m/s)   OUTCOME MEASURES: TEST Outcome Interpretation  10 meter walk test (use of SPC)        0.438 m/s <1.0 m/s indicates increased risk for falls; limited community ambulator  ABC  78.125% Limited in ability to perform activities  Berg Balance Assessment 42/56 <36/56 (100% risk for falls), 37-45 (80% risk for falls); 46-51 (>50% risk for falls); 52-55 (lower  risk <25% of falls)    This patient presents with 3 personal factors/ comorbidities, and 3 body elements including body structures and functions, activity limitations and or participation restrictions. Patient's condition is evolving.   Objective measurements completed on examination: See above findings.    Treatment:  -Education surrounding likely prognosis and POC from a PT standpoint -Education regarding HEP and new home exercises   *Of note, patient arrived to evaluation approximately 20 minutes late.        PT Education - 03/10/18 1522    Education Details  POC, role of PT with patient's diagnosis, HEP/exercise technique, stability    Person(s) Educated  Patient    Methods  Explanation;Demonstration;Tactile cues;Verbal cues;Handout    Comprehension  Verbalized understanding;Returned demonstration;Verbal cues required;Tactile cues required;Need further instruction       PT Short Term Goals - 03/10/18 1617      PT SHORT TERM GOAL #1   Title  Patient will be independent in home exercise program to improve strength/mobility for better functional independence with ADLs.     Baseline  2/19: HEP given    Time  2    Period  Weeks    Status  New    Target Date  03/24/18      PT SHORT TERM GOAL #2   Title  Patient will report a worst pain of 3/10 on the VAS during ambulation/functional mobility.    Baseline  03/10/2018: 4/10    Time  2    Period  Weeks    Status  New    Target Date  03/24/18        PT Long Term Goals - 03/10/18 1623      PT LONG TERM GOAL #1   Title  Patient will report a worst pain of 1/10 on VAS with ambulation/functional mobility.    Baseline  03/10/2018: 4/10    Time  8    Period  Weeks    Status  New    Target Date  05/05/18      PT LONG TERM GOAL #2   Title  Patient will improve score on 10MWT to >0.30m/s to improve independence with ambulation and to decrease risk of falls.    Baseline  03/10/2018: .438 m/s    Time  8    Period  Weeks     Status  New    Target Date  05/05/18      PT LONG TERM GOAL #3   Title  Patient will improve score on Berg Balance Test to at least 50/56 to demonstrate improved balance and safety with functional mobility.    Baseline  03/10/2018: 42/56    Time  8    Period  Weeks    Status  New    Target Date  05/05/18      PT LONG TERM GOAL #4   Title  The patient will demonstrate improve LLE dorsiflexion strength to at least 3/5 to improve gait mechanics and safety with ambulation.    Baseline  03/10/2018: 2+/5    Time  8    Period  Weeks    Status  New    Target Date  05/05/18      PT LONG TERM GOAL #5   Title  Patient will negotiate 4 steps with supervision using unilateral UE  support (L railing ascending, R railing descending) to promote safety and independence in home environment.    Baseline  03/10/2018: patient reports she requires external assistance and significant UE assistance    Time  8    Period  Weeks    Status  New    Target Date  05/05/18             Plan - 03/10/18 1609    Clinical Impression Statement  The patient is a pleasant 58 year old female that presents to PT with a medical diagnosis of severe diabetic neuropathy of the LLE with impairments that include: global strength deficits of BLE (LLE more impaired than RLE), severely decreased L ankle dorsiflexion strength, impaired coordination of LLE, decreased static and dynamic balance, and decreased gait mechanics and gait speed. The patient's current condition and number and degree of impairments place her at an increased risk for falls and a decreased ability to perform ADLs and activities that she enjoys safely and independently. The patient will benefit from skilled PT in order to work towards goals, to increase BLE strength and balance, and to maximize safety and independence with functional mobility.    History and Personal Factors relevant to plan of care:  Diabetes Type II, Severe Diabetic Neuropathy, Hypertension,  Arthritis, DDD    Clinical Presentation  Evolving    Clinical Presentation due to:  recent increase in falls, fluctuation of degree and severity of symptoms (numbness/tingling/swelling), hypertension    Clinical Decision Making  Moderate    Rehab Potential  Fair    Clinical Impairments Affecting Rehab Potential  (+) family support, motivation; (-) financial limitations, other medical comorbidities, chronicity    PT Frequency  1x / week    PT Duration  8 weeks    PT Treatment/Interventions  ADLs/Self Care Home Management;Aquatic Therapy;Biofeedback;Cryotherapy;Electrical Stimulation;Iontophoresis 4mg /ml Dexamethasone;Moist Heat;Traction;Ultrasound;DME Instruction;Gait training;Stair training;Functional mobility training;Therapeutic activities;Therapeutic exercise;Balance training;Neuromuscular re-education;Patient/family education;Cognitive remediation;Orthotic Fit/Training;Manual techniques;Passive range of motion;Compression bandaging;Energy conservation;Splinting;Taping;Visual/perceptual remediation/compensation    PT Next Visit Plan  review HEP; introduce balance; nerve glides    PT Home Exercise Plan  see instructions section    Consulted and Agree with Plan of Care  Patient       Patient will benefit from skilled therapeutic intervention in order to improve the following deficits and impairments:  Abnormal gait, Cardiopulmonary status limiting activity, Decreased activity tolerance, Decreased balance, Decreased endurance, Decreased coordination, Decreased knowledge of use of DME, Decreased mobility, Decreased strength, Difficulty walking, Increased muscle spasms, Impaired perceived functional ability, Impaired flexibility, Increased edema, Decreased range of motion, Impaired vision/preception, Improper body mechanics, Postural dysfunction, Pain  Visit Diagnosis: Muscle weakness (generalized)  Other abnormalities of gait and mobility  Pain in left leg     Problem List Patient Active  Problem List   Diagnosis Date Noted  . Abnormal EMG (07/06/2017) 11/23/2017  . Long-term insulin use ([place]) 11/10/2017  . Uncontrolled type 2 diabetes mellitus with hyperglycemia ([place]) 11/10/2017  . Vitamin D deficiency 10/21/2017  . DM type 2 with diabetic peripheral neuropathy ([place]) 10/21/2017  . DDD (degenerative disc disease), lumbar 10/21/2017  . Lumbar facet arthropathy 10/21/2017  . Lumbar facet syndrome (Bilateral) 10/21/2017  . Lumbar facet hypertrophy 10/21/2017  . Lumbar foraminal stenosis (L5-S1) (Right) 10/21/2017  . Osteoarthritis of knee (Bilateral) 10/21/2017  . Chronic musculoskeletal pain 10/21/2017  . Essential (primary) hypertension 10/12/2017  . Chronic ankle pain (Primary Area of Pain) (Left) 10/12/2017  . Chronic foot pain (Primary Area of Pain) (Left) 10/12/2017  .  Chronic lower extremity pain (Secondary Area of Pain) (Bilateral) (L>R) 10/12/2017  . Chronic knee pain (Fourth Area of Pain) (Bilateral) (L>R) 10/12/2017  . Chronic low back pain [facility] Area of Pain) (Bilateral) (L>R) w/ sciatica (Left) 10/12/2017  . Chronic pain syndrome 10/12/2017  . Opiate use 10/12/2017  . Pharmacologic therapy 10/12/2017  . Disorder of skeletal system 10/12/2017  . Problems influencing health status 10/12/2017  . Neuropathy 08/11/2017  . Burning sensation of feet 07/06/2017  . Lower extremity numbness and tingling (Left) 07/06/2017  . Polyneuropathy associated with underlying disease ([place]) 05/29/2017  . DM type 2, uncontrolled, with neuropathy ([place]) 05/05/2017  . Neuropathic pain 06/29/2015  . GERD without esophagitis 01/16/2014   Orlean Patten, SPT  This entire session was performed under direct supervision and direction of a licensed therapist/therapist assistant . I have personally read, edited and approve of the note as written.  Janna Arch, PT, DPT   03/10/2018, 5:35 PM  [place] MAIN [facility] SERVICES [address]  [place], [name], [address] Phone: [phone]   Fax:  [phone]  Name: Charlene [name] MRN: [id number] Date of Birth: 1960/11/13

## 2018-03-11 NOTE — Patient Instructions (Signed)
Access Code: [email or web address]  URL: [email or web address]  Date: 03/10/2018  Prepared by: Janna Arch   Exercises  Seated Heel Toe Raises - 10 reps - 2 sets - 5 hold - 1x daily - 7x weekly  Seated Ankle Alphabet - 1 reps - 2 sets - 1 hold - 1x daily - 7x weekly  Standing March with Counter Support - 10 reps - 2 sets - 5 hold - 1x daily - 7x weekly  Standing Hip Abduction with Counter Support - 10 reps - 2 sets - 5 hold - 1x daily - 7x weekly  Standing Hip Extension with Counter Support - 10 reps - 2 sets - 5 hold - 1x daily - 7x weekly  Side to Side Weight Shift with Counter Support - 10 reps - 2 sets - 5 hold - 1x daily - 7x weekly

## 2018-03-15 ENCOUNTER — Encounter: Payer: Self-pay | Admitting: Pain Medicine

## 2018-03-15 ENCOUNTER — Other Ambulatory Visit: Payer: Self-pay

## 2018-03-15 ENCOUNTER — Ambulatory Visit: Payer: BLUE CROSS/BLUE SHIELD | Attending: Pain Medicine | Admitting: Pain Medicine

## 2018-03-15 VITALS — BP 148/91 | HR 90 | Temp 98.6°F | Ht 59.0 in | Wt 115.0 lb

## 2018-03-15 DIAGNOSIS — M48061 Spinal stenosis, lumbar region without neurogenic claudication: Secondary | ICD-10-CM

## 2018-03-15 DIAGNOSIS — R202 Paresthesia of skin: Secondary | ICD-10-CM | POA: Diagnosis present

## 2018-03-15 DIAGNOSIS — G8929 Other chronic pain: Secondary | ICD-10-CM

## 2018-03-15 DIAGNOSIS — M79605 Pain in left leg: Secondary | ICD-10-CM | POA: Diagnosis present

## 2018-03-15 DIAGNOSIS — R200 Anesthesia of skin: Secondary | ICD-10-CM | POA: Diagnosis not present

## 2018-03-15 DIAGNOSIS — M79604 Pain in right leg: Secondary | ICD-10-CM

## 2018-03-15 DIAGNOSIS — M5136 Other intervertebral disc degeneration, lumbar region: Secondary | ICD-10-CM

## 2018-03-15 NOTE — Patient Instructions (Addendum)
____________________________________________________________________________________________  Preparing for Procedure with Sedation  Instructions: . Oral Intake: Do not eat or drink anything for at least 8 hours prior to your procedure. . Transportation: Public transportation is not allowed. Bring an adult driver. The driver must be physically present in our waiting room before any procedure can be started. . Physical Assistance: Bring an adult physically capable of assisting you, in the event you need help. This adult should keep you company at home for at least 6 hours after the procedure. . Blood Pressure Medicine: Take your blood pressure medicine with a sip of water the morning of the procedure. . Blood thinners: Notify our staff if you are taking any blood thinners. Depending on which one you take, there will be specific instructions on how and when to stop it. . Diabetics on insulin: Notify the staff so that you can be scheduled 1st case in the morning. If your diabetes requires high dose insulin, take only  of your normal insulin dose the morning of the procedure and notify the staff that you have done so. . Preventing infections: Shower with an antibacterial soap the morning of your procedure. . Build-up your immune system: Take 1000 mg of Vitamin C with every meal (3 times a day) the day prior to your procedure. . Antibiotics: Inform the staff if you have a condition or reason that requires you to take antibiotics before dental procedures. . Pregnancy: If you are pregnant, call and cancel the procedure. . Sickness: If you have a cold, fever, or any active infections, call and cancel the procedure. . Arrival: You must be in the facility at least 30 minutes prior to your scheduled procedure. . Children: Do not bring children with you. . Dress appropriately: Bring dark clothing that you would not mind if they get stained. . Valuables: Do not bring any jewelry or valuables.  Procedure  appointments are reserved for interventional treatments only. . No Prescription Refills. . No medication changes will be discussed during procedure appointments. . No disability issues will be discussed.  Reasons to call and reschedule or cancel your procedure: (Following these recommendations will minimize the risk of a serious complication.) . Surgeries: Avoid having procedures within 2 weeks of any surgery. (Avoid for 2 weeks before or after any surgery). . Flu Shots: Avoid having procedures within 2 weeks of a flu shots or . (Avoid for 2 weeks before or after immunizations). . Barium: Avoid having a procedure within 7-10 days after having had a radiological study involving the use of radiological contrast. (Myelograms, Barium swallow or enema study). . Heart attacks: Avoid any elective procedures or surgeries for the initial 6 months after a "Myocardial Infarction" (Heart Attack). . Blood thinners: It is imperative that you stop these medications before procedures. Let us know if you if you take any blood thinner.  . Infection: Avoid procedures during or within two weeks of an infection (including chest colds or gastrointestinal problems). Symptoms associated with infections include: Localized redness, fever, chills, night sweats or profuse sweating, burning sensation when voiding, cough, congestion, stuffiness, runny nose, sore throat, diarrhea, nausea, vomiting, cold or Flu symptoms, recent or current infections. It is specially important if the infection is over the area that we intend to treat. . Heart and lung problems: Symptoms that may suggest an active cardiopulmonary problem include: cough, chest pain, breathing difficulties or shortness of breath, dizziness, ankle swelling, uncontrolled high or unusually low blood pressure, and/or palpitations. If you are experiencing any of these symptoms, cancel   your procedure and contact your primary care physician for an evaluation.  Remember:   Regular Business hours are:  Monday to Thursday 8:00 AM to 4:00 PM  Provider's Schedule: Milinda Pointer, MD:  Procedure days: Tuesday and Thursday 7:30 AM to 4:00 PM  Gillis Santa, MD:  Procedure days: Monday and Wednesday 7:30 AM to 4:00 PM ____________________________________________________________________________________________   Selective Nerve Root Block Patient Information  Description: Specific nerve roots exit the spinal canal and these nerves can be compressed and inflamed by a bulging disc and bone spurs.  By injecting steroids on the nerve root, we can potentially decrease the inflammation surrounding these nerves, which often leads to decreased pain.  Also, by injecting local anesthesia on the nerve root, this can provide [place] helpful information to give to your referring doctor if it decreases your pain.  Selective nerve root blocks can be done along the spine from the neck to the low back depending on the location of your pain.   After numbing the skin with local anesthesia, a small needle is passed to the nerve root and the position of the needle is verified using x-ray pictures.  After the needle is in correct position, we then deposit the medication.  You may experience a pressure sensation while this is being done.  The entire block usually lasts less than 15 minutes.  Conditions that may be treated with selective nerve root blocks:  Low back and leg pain  Spinal stenosis  Diagnostic block prior to potential surgery  Neck and arm pain  Post laminectomy syndrome  Preparation for the injection:  1. Do not eat any solid food or dairy products within 8 hours of your appointment. 2. You may drink clear liquids up to 3 hours before an appointment.  Clear liquids include water, black coffee, juice or soda.  No milk or cream please. 3. You may take your regular medications, including pain medications, with a sip of water before your appointment.  Diabetics should hold  regular insulin (if taken separately) and take 1/2 normal NPH dose the morning of the procedure.  Carry some sugar containing items with you to your appointment. 4. A driver must accompany you and be prepared to drive you home after your procedure. 5. Bring all your current medications with you. 6. An IV may be inserted and sedation may be given at the discretion of the physician. 7. A blood pressure cuff, EKG, and other monitors will often be applied during the procedure.  Some patients may need to have extra oxygen administered for a short period. 8. You will be asked to provide medical information, including allergies, prior to the procedure.  We must know immediately if you are taking blood  Thinners (like Coumadin) or if you are allergic to IV iodine contrast (dye).  Possible side-effects: All are usually temporary  Bleeding from needle site  Light headedness  Numbness and tingling  Decreased blood pressure  Weakness in arms/legs  Pressure sensation in back/neck  Pain at injection site (several days)  Possible complications: All are extremely rare  Infection  Nerve injury  Spinal headache (a headache wore with upright position)  Call if you experience:  Fever/chills associated with headache or increased back/neck pain  Headache worsened by an upright position  New onset weakness or numbness of an extremity below the injection site  Hives or difficulty breathing (go to the emergency room)  Inflammation or drainage at the injection site(s)  Severe back/neck pain greater than usual  New  symptoms which are concerning to you  Please note:  Although the local anesthetic injected can often make your back or neck feel good for several hours after the injection the pain will likely return.  It takes 3-5 days for steroids to work on the nerve root. You may not notice any pain relief for at least one week.  If effective, we will often do a series of 3 injections  spaced 3-6 weeks apart to maximally decrease your pain.    If you have any questions, please call [phone] [facility] Patient Information  Description: The epidural space surrounds the nerves as they exit the spinal cord.  In some patients, the nerves can be compressed and inflamed by a bulging disc or a tight spinal canal (spinal stenosis).  By injecting steroids into the epidural space, we can bring irritated nerves into direct contact with a potentially helpful medication.  These steroids act directly on the irritated nerves and can reduce swelling and inflammation which often leads to decreased pain.  Epidural steroids may be injected anywhere along the spine and from the neck to the low back depending upon the location of your pain.   After numbing the skin with local anesthetic (like Novocaine), a small needle is passed into the epidural space slowly.  You may experience a sensation of pressure while this is being done.  The entire block usually last less than 10 minutes.  Conditions which may be treated by epidural steroids:   Low back and leg pain  Neck and arm pain  Spinal stenosis  Post-laminectomy syndrome  Herpes zoster (shingles) pain  Pain from compression fractures  Preparation for the injection:  9. Do not eat any solid food or dairy products within 8 hours of your appointment.  10. You may drink clear liquids up to 3 hours before appointment.  Clear liquids include water, black coffee, juice or soda.  No milk or cream please. 11. You may take your regular medication, including pain medications, with a sip of water before your appointment  Diabetics should hold regular insulin (if taken separately) and take 1/2 normal NPH dos the morning of the procedure.  Carry some sugar containing items with you to your appointment. 12. A driver must accompany you and be prepared to drive you home after your procedure.   8. Bring all your current medications with your. 14. An IV may be inserted and sedation may be given at the discretion of the physician.   15. A blood pressure cuff, EKG and other monitors will often be applied during the procedure.  Some patients may need to have extra oxygen administered for a short period. 64. You will be asked to provide medical information, including your allergies, prior to the procedure.  We must know immediately if you are taking blood thinners (like Coumadin/Warfarin)  Or if you are allergic to IV iodine contrast (dye). We must know if you could possible be pregnant.  Possible side-effects:  Bleeding from needle site  Infection (rare, may require surgery)  Nerve injury (rare)  Numbness & tingling (temporary)  Difficulty urinating (rare, temporary)  Spinal headache ( a headache worse with upright posture)  Light -headedness (temporary)  Pain at injection site (several days)  Decreased blood pressure (temporary)  Weakness in arm/leg (temporary)  Pressure sensation in back/neck (temporary)  Call if you experience:  Fever/chills associated with headache or increased back/neck pain.  Headache worsened by an upright position.  New onset  weakness or numbness of an extremity below the injection site  Hives or difficulty breathing (go to the emergency room)  Inflammation or drainage at the infection site  Severe back/neck pain  Any new symptoms which are concerning to you  Please note:  Although the local anesthetic injected can often make your back or neck feel good for several hours after the injection, the pain will likely return.  It takes 3-7 days for steroids to work in the epidural space.  You may not notice any pain relief for at least that one week.  If effective, we will often do a series of three injections spaced 3-6 weeks apart to maximally decrease your pain.  After the initial series, we generally will wait several months before  considering a repeat injection of the same type.  If you have any questions, please call [phone] [facility]

## 2018-03-15 NOTE — Progress Notes (Signed)
Patient's Name: Charlene [name]  MRN: [id number]  Referring Provider: Tracie Harrier, MD  DOB: 04/09/1960  PCP: Tracie Harrier, MD  DOS: 03/15/2018  Note by: Gaspar Cola, MD  Service setting: Ambulatory outpatient  Specialty: Interventional Pain Management  Location: [facility] (AMB) Pain Management Facility    Patient type: Established   Primary Reason(s) for Visit: Encounter for post-procedure evaluation of chronic illness with mild to moderate exacerbation CC: Back Pain  HPI  Charlene [name] is a 58 y.o. year old, female patient, who comes today for a post-procedure evaluation. She has Burning sensation of feet; DM type 2, uncontrolled, with neuropathy ([place]); GERD without esophagitis; Essential (primary) hypertension; Neuropathic pain; Neuropathy; Lower extremity numbness and tingling (Left); Polyneuropathy associated with underlying disease ([place]); Chronic ankle pain (Primary Area of Pain) (Left); Chronic foot pain (Primary Area of Pain) (Left); Chronic lower extremity pain (Secondary Area of Pain) (Bilateral) (L>R); Chronic knee pain (Fourth Area of Pain) (Bilateral) (L>R); Chronic low back pain [facility] Area of Pain) (Bilateral) (L>R) w/ sciatica (Left); Chronic pain syndrome; Opiate use; Pharmacologic therapy; Disorder of skeletal system; Problems influencing health status; Vitamin D deficiency; DM type 2 with diabetic peripheral neuropathy ([place]); DDD (degenerative disc disease), lumbar; Lumbar facet arthropathy; Lumbar facet syndrome (Bilateral); Lumbar facet hypertrophy; Lumbar foraminal stenosis (L5-S1) (Right); Osteoarthritis of knee (Bilateral); Chronic musculoskeletal pain; Long-term insulin use ([place]); Uncontrolled type 2 diabetes mellitus with hyperglycemia ([place]); and Abnormal EMG (07/06/2017) on their problem list. Her primarily concern today is the Back Pain  Pain Assessment: Location:   Leg Radiating: Denies Onset: More than a month ago Duration: Neuropathic pain Quality: Aching,  Tingling Severity: 1 /10 (subjective, self-reported pain score)  Note: Reported level is compatible with observation.                         When using our objective Pain Scale, levels between 6 and 10/10 are said to belong in an emergency room, as it progressively worsens from a 6/10, described as severely limiting, requiring emergency care not usually available at an outpatient pain management facility. At a 6/10 level, communication becomes difficult and requires great effort. Assistance to reach the emergency department may be required. Facial flushing and profuse sweating along with potentially dangerous increases in heart rate and blood pressure will be evident. Effect on ADL: no prolonged walking or standing, impaired balance and gait Timing: Constant Modifying factors: heat, medication and occasionally stretching BP: (!) 148/91  HR: 90  Charlene [name] comes in today for post-procedure evaluation.  Further details on both, my assessment(s), as well as the proposed treatment plan, please see below.  Post-Procedure Assessment  02/25/2018 Procedure: Therapeutic bilateral intra-articular Hyalgan knee injection #5+diagnostic/therapeutic left-sided L5-S1 interlaminar LESI #1under fluoroscopic guidance and IV sedation  Pre-procedure pain score:        /10 Post-procedure pain score: 0/10         Influential Factors: BMI: 23.23 kg/m Intra-procedural challenges: None observed.         Assessment challenges: None detected.              Reported side-effects: None.        Post-procedural adverse reactions or complications: None reported         Sedation: Please see nurses note. When no sedatives are used, the analgesic levels obtained are directly associated to the effectiveness of the local anesthetics. However, when sedation is provided, the level of analgesia obtained during the initial 1 hour following  the intervention, is believed to be the result of a combination of factors. These factors may  include, but are not limited to: 1. The effectiveness of the local anesthetics used. 2. The effects of the analgesic(s) and/or anxiolytic(s) used. 3. The degree of discomfort experienced by the patient at the time of the procedure. 4. The patients ability and reliability in recalling and recording the events. 5. The presence and influence of possible secondary gains and/or psychosocial factors. Reported result: Relief experienced during the 1st hour after the procedure: 100 % (Ultra-Short Term Relief)            Interpretative annotation: Clinically appropriate result. Analgesia during this period is likely to be Local Anesthetic and/or IV Sedative (Analgesic/Anxiolytic) related.          Effects of local anesthetic: The analgesic effects attained during this period are directly associated to the localized infiltration of local anesthetics and therefore cary significant diagnostic value as to the etiological location, or anatomical origin, of the pain. Expected duration of relief is directly dependent on the pharmacodynamics of the local anesthetic used. Long-acting (4-6 hours) anesthetics used.  Reported result: Relief during the next 4 to 6 hour after the procedure: 100 % (Short-Term Relief)            Interpretative annotation: Clinically appropriate result. Analgesia during this period is likely to be Local Anesthetic-related.          Long-term benefit: Defined as the period of time past the expected duration of local anesthetics (1 hour for short-acting and 4-6 hours for long-acting). With the possible exception of prolonged sympathetic blockade from the local anesthetics, benefits during this period are typically attributed to, or associated with, other factors such as analgesic sensory neuropraxia, antiinflammatory effects, or beneficial biochemical changes provided by agents other than the local anesthetics.  Reported result: Extended relief following procedure: 100 %(lasted for 2 weeks)  (Long-Term Relief)            Interpretative annotation: Clinically possible results. Good relief. No permanent benefit expected. Inflammation plays a part in the etiology to the pain.          Current benefits: Defined as reported results that persistent at this point in time.   Analgesia: >50 % Ms. Kovarik reports improvement of axial and extremity symptoms. Function: Somewhat improved ROM: Somewhat improved Interpretative annotation: Ongoing benefit. Incomplete therapeutic success. Effective therapeutic approach.          Interpretation: Results would suggest a successful diagnostic intervention.                  Plan:  Please see "Plan of Care" for details.                Laboratory Chemistry  Inflammation Markers (CRP: Acute Phase) (ESR: Chronic Phase) Lab Results  Component Value Date   CRP 4 10/12/2017   ESRSEDRATE 12 10/12/2017                         Renal Markers Lab Results  Component Value Date   BUN 14 12/17/2017   CREATININE 0.45 12/17/2017   BCR 17 10/12/2017   GFRAA >60 12/17/2017   GFRNONAA >60 12/17/2017                             Hepatic Markers Lab Results  Component Value Date   AST 18 12/17/2017   ALT 18  12/17/2017   ALBUMIN 4.1 12/17/2017                        Note: Lab results reviewed.  Recent Imaging Results   Results for orders placed in visit on 02/25/18  DG C-Arm 1-60 Min-No Report   Narrative Fluoroscopy was utilized by the requesting physician.  No radiographic  interpretation.    Interpretation Report: Fluoroscopy was used during the procedure to assist with needle guidance. The images were interpreted intraoperatively by the requesting physician.  Meds   Current Outpatient Medications:  .  amLODipine (NORVASC) 2.5 MG tablet, Take by mouth., Disp: , Rfl:  .  baclofen (LIORESAL) 10 MG tablet, Take 1 tablet (10 mg total) by mouth 3 (three) times daily., Disp: 90 tablet, Rfl: 2 .  [START ON 04/19/2018] Calcium Carbonate-Vit D-Min  (GNP CALCIUM 1200) 1200-1000 MG-UNIT CHEW, Chew 1,200 mg by mouth daily with breakfast. Take in combination with vitamin D and magnesium., Disp: 30 tablet, Rfl: 5 .  [START ON 04/19/2018] Cholecalciferol (VITAMIN D3) 125 MCG (5000 UT) CAPS, Take 1 capsule (5,000 Units total) by mouth daily with breakfast. Take along with calcium and magnesium., Disp: 30 capsule, Rfl: 5 .  DULoxetine (CYMBALTA) 30 MG capsule, Take 30 mg by mouth daily., Disp: , Rfl:  .  gabapentin (NEURONTIN) 800 MG tablet, Take 800 mg by mouth 4 (four) times daily., Disp: , Rfl:  .  Insulin Glargine, 2 Unit Dial, (TOUJEO MAX SOLOSTAR) 300 UNIT/ML SOPN, Inject 140 Units into the skin daily. 30 units AM and 110 units HS, Disp: , Rfl:  .  Lidocaine-Menthol (ICY HOT LIDOCAINE PLUS MENTHOL EX), Apply 1 application topically., Disp: , Rfl:  .  liraglutide (VICTOZA) 18 MG/3ML SOPN, Inject 1.8 mg into the skin daily., Disp: , Rfl:  .  lisinopril (PRINIVIL,ZESTRIL) 40 MG tablet, Take 40 mg by mouth daily., Disp: , Rfl:  .  Magnesium 500 MG CAPS, Take 1 capsule (500 mg total) by mouth 2 (two) times daily at 8 am and 10 pm., Disp: 60 capsule, Rfl: 5 .  meloxicam (MOBIC) 15 MG tablet, Take 15 mg by mouth daily., Disp: , Rfl:  .  metoprolol succinate (TOPROL-XL) 50 MG 24 hr tablet, Take 25 mg by mouth 2 (two) times daily. , Disp: , Rfl:  .  pantoprazole (PROTONIX) 40 MG tablet, Take 40 mg by mouth 2 (two) times daily., Disp: , Rfl:  .  polyethylene glycol (MIRALAX / GLYCOLAX) packet, Take 17 g by mouth daily. Mix one tablespoon with 8oz of your favorite juice or water every day until you are having soft formed stools. Then start taking once daily if you didn't have a stool the day before., Disp: 30 each, Rfl: 0 .  traMADol (ULTRAM) 50 MG tablet, Take 1 tablet (50 mg total) by mouth every 8 (eight) hours as needed for severe pain., Disp: 90 tablet, Rfl: 2  ROS  Constitutional: Denies any fever or chills Gastrointestinal: No reported hemesis,  hematochezia, vomiting, or acute GI distress Musculoskeletal: Denies any acute onset joint swelling, redness, loss of ROM, or weakness Neurological: No reported episodes of acute onset apraxia, aphasia, dysarthria, agnosia, amnesia, paralysis, loss of coordination, or loss of consciousness  Allergies  Ms. Lasure has No Known Allergies.  [place]  Drug: Ms. Splinter  reports no history of drug use. Alcohol:  reports previous alcohol use. Tobacco:  reports that she has never smoked. She has never used smokeless tobacco. Medical:  has  a past medical history of Arthritis, Degenerative disc disease, lumbar, Diabetes mellitus without complication ([place]), GERD (gastroesophageal reflux disease), Hypertension, Neuropathy, Polyneuropathy, and Polyneuropathy. Surgical: Ms. Holsonback  has a past surgical history that includes arthroscopic rotator cuff; Colonoscopy; Abdominal hysterectomy; endosopic sinus; and Cataract extraction w/PHACO (Right, 11/25/2017). Family: family history includes Alcohol abuse in her father; Breast cancer in her maternal aunt; Cancer in her father.  Constitutional Exam  General appearance: Well nourished, well developed, and well hydrated. In no apparent acute distress Vitals:   03/15/18 1408  BP: (!) 148/91  Pulse: 90  Temp: 98.6 F (37 C)  SpO2: 100%  Weight: 115 lb (52.2 kg)  Height: _0  (1.499 m)   BMI Assessment: Estimated body mass index is 23.23 kg/m as calculated from the following:   Height as of this encounter: _1  (1.499 m).   Weight as of this encounter: 115 lb (52.2 kg).  BMI interpretation table: BMI level Category Range association with higher incidence of chronic pain  <18 kg/m2 Underweight   18.5-24.9 kg/m2 Ideal body weight   25-29.9 kg/m2 Overweight Increased incidence by 20%  30-34.9 kg/m2 Obese (Class I) Increased incidence by 68%  35-39.9 kg/m2 Severe obesity (Class II) Increased incidence by 136%  >40 kg/m2 Extreme obesity (Class III) Increased  incidence by 254%   Patient's current BMI Ideal Body weight  Body mass index is 23.23 kg/m. Patient must be at least 60 in tall to calculate ideal body weight   BMI Readings from Last 4 Encounters:  03/15/18 23.23 kg/m  02/25/18 23.23 kg/m  02/24/18 23.23 kg/m  02/11/18 23.03 kg/m   Wt Readings from Last 4 Encounters:  03/15/18 115 lb (52.2 kg)  02/25/18 115 lb (52.2 kg)  02/24/18 115 lb (52.2 kg)  02/11/18 114 lb (51.7 kg)  Psych/Mental status: Alert, oriented x 3 (person, place, & time)       Eyes: PERLA Respiratory: No evidence of acute respiratory distress  Cervical Spine Area Exam  Skin & Axial Inspection: No masses, redness, edema, swelling, or associated skin lesions Alignment: Symmetrical Functional ROM: Unrestricted ROM      Stability: No instability detected Muscle Tone/Strength: Functionally intact. No obvious neuro-muscular anomalies detected. Sensory (Neurological): Unimpaired Palpation: No palpable anomalies              Upper Extremity (UE) Exam    Side: Right upper extremity  Side: Left upper extremity  Skin & Extremity Inspection: Skin color, temperature, and hair growth are WNL. No peripheral edema or cyanosis. No masses, redness, swelling, asymmetry, or associated skin lesions. No contractures.  Skin & Extremity Inspection: Skin color, temperature, and hair growth are WNL. No peripheral edema or cyanosis. No masses, redness, swelling, asymmetry, or associated skin lesions. No contractures.  Functional ROM: Unrestricted ROM          Functional ROM: Unrestricted ROM          Muscle Tone/Strength: Functionally intact. No obvious neuro-muscular anomalies detected.  Muscle Tone/Strength: Functionally intact. No obvious neuro-muscular anomalies detected.  Sensory (Neurological): Unimpaired          Sensory (Neurological): Unimpaired          Palpation: No palpable anomalies              Palpation: No palpable anomalies              Provocative Test(s):  Phalen's  test: deferred Tinel's test: deferred Apley's scratch test (touch opposite shoulder):  Action 1 (Across chest): deferred Action  2 (Overhead): deferred Action 3 (LB reach): deferred   Provocative Test(s):  Phalen's test: deferred Tinel's test: deferred Apley's scratch test (touch opposite shoulder):  Action 1 (Across chest): deferred Action 2 (Overhead): deferred Action 3 (LB reach): deferred    Thoracic Spine Area Exam  Skin & Axial Inspection: No masses, redness, or swelling Alignment: Symmetrical Functional ROM: Unrestricted ROM Stability: No instability detected Muscle Tone/Strength: Functionally intact. No obvious neuro-muscular anomalies detected. Sensory (Neurological): Unimpaired Muscle strength & Tone: No palpable anomalies  Lumbar Spine Area Exam  Skin & Axial Inspection: No masses, redness, or swelling Alignment: Symmetrical Functional ROM: Decreased ROM       Stability: No instability detected Muscle Tone/Strength: Functionally intact. No obvious neuro-muscular anomalies detected. Sensory (Neurological): Unimpaired Palpation: No palpable anomalies       Provocative Tests: Hyperextension/rotation test: deferred today       Lumbar quadrant test (Kemp's test): deferred today       Lateral bending test: deferred today       Patrick's Maneuver: deferred today                   FABER* test: deferred today                   S-I anterior distraction/compression test: deferred today         S-I lateral compression test: deferred today         S-I Thigh-thrust test: deferred today         S-I Gaenslen's test: deferred today         *(Flexion, ABduction and External Rotation)  Gait & Posture Assessment  Ambulation: Patient ambulates using a cane Gait: Limited. Using assistive device to ambulate Posture: Antalgic   Lower Extremity Exam    Side: Right lower extremity  Side: Left lower extremity  Stability: No instability observed          Stability: No instability  observed          Skin & Extremity Inspection: Skin color, temperature, and hair growth are WNL. No peripheral edema or cyanosis. No masses, redness, swelling, asymmetry, or associated skin lesions. No contractures.  Skin & Extremity Inspection: Skin color, temperature, and hair growth are WNL. No peripheral edema or cyanosis. No masses, redness, swelling, asymmetry, or associated skin lesions. No contractures.  Functional ROM: Unrestricted ROM                  Functional ROM: Unrestricted ROM                  Muscle Tone/Strength: Functionally intact. No obvious neuro-muscular anomalies detected.  Muscle Tone/Strength: Functionally intact. No obvious neuro-muscular anomalies detected.  Sensory (Neurological): Unimpaired        Sensory (Neurological): Unimpaired        DTR: Patellar: deferred today Achilles: deferred today Plantar: deferred today  DTR: Patellar: deferred today Achilles: deferred today Plantar: deferred today  Palpation: No palpable anomalies  Palpation: No palpable anomalies   Assessment   Status Diagnosis  Improving Improving Stable 1. Chronic lower extremity pain (Secondary Area of Pain) (Bilateral) (L>R)   2. Lower extremity numbness and tingling (Left)   3. DDD (degenerative disc disease), lumbar   4. Lumbar foraminal stenosis (L5-S1) (Right)      Updated Problems: No problems updated.  Plan of Care  Pharmacotherapy (Medications Ordered): No orders of the defined types were placed in this encounter.  Medications administered today: Maritta L. Phagan had no  medications administered during this visit.   Procedure Orders     Lumbar Epidural Injection     Lumbar Transforaminal Epidural Lab Orders  No laboratory test(s) ordered today   Imaging Orders  No imaging studies ordered today   Referral Orders  No referral(s) requested today   Interventional management options: Planned, scheduled, and/or pending:   Diagnostic left-sided L5 transforaminal ESI  #1 + left-sided L4-5 interlaminar LESI #2 under fluoroscopic guidance and IV sedation.   Considering:   Diagnostic left-sided lumbar sympathetic block Possible left-sided lumbar sympathetic RFA Diagnostic left-sided L4-5 interlaminar LESI Diagnostic right-sided L5 transforaminal ESI Diagnostic bilateral lumbar facet block Possible bilateral lumbar facet RFA Diagnostic bilateral intra-articular knee joint injection with local anesthetic and steroid Possible series of 5 bilateral intra-articular Hyalgan knee injections Diagnostic bilateral genicular nerve block Possible bilateral genicular nerve RFA   Palliative PRN treatment(s):   Palliative bilateral Hyalgan knee series #2 (last done on 02/25/2018)   Provider-requested follow-up: Return for Procedure (w/ sedation): (L) L5 TFESI #1 + (L) L4-5 LESI #2.  Future Appointments  Date Time Provider [place]  03/23/2018  8:15 AM Milinda Pointer, MD [facility] None  03/31/2018 11:15 AM Janna Arch, PT [facility] None  04/07/2018  1:00 PM Janna Arch, PT [facility] None  04/14/2018  1:00 PM Janna Arch, PT [facility] None  04/21/2018  1:00 PM Janna Arch, PT [facility] None  04/28/2018  1:00 PM Janna Arch, PT [facility] None  05/05/2018  1:00 PM Janna Arch, PT [facility] None  05/12/2018  1:00 PM Janna Arch, PT [facility] None  05/18/2018  9:45 AM Vevelyn Francois, NP [facility] None   Primary Care Physician: Tracie Harrier, MD Location: [facility] Note by: Gaspar Cola, MD Date: 03/15/2018; Time: 2:39 PM

## 2018-03-19 ENCOUNTER — Ambulatory Visit: Payer: BLUE CROSS/BLUE SHIELD

## 2018-03-19 VITALS — BP 194/98

## 2018-03-19 DIAGNOSIS — M6281 Muscle weakness (generalized): Secondary | ICD-10-CM | POA: Diagnosis not present

## 2018-03-19 DIAGNOSIS — R2689 Other abnormalities of gait and mobility: Secondary | ICD-10-CM

## 2018-03-19 NOTE — Patient Instructions (Signed)
Access Code: [email or web address] URL: [email or web address] Date: 03/19/2018 Prepared by: Janna Arch  Exercises with Mini Squat for Counter Support-10 reps- 2 sets- 5 hold- 1x daily- 7x weekly Standing hamstring curl with chair support-10 reps -2 sets -5 hold- 1x daily -7x weekly

## 2018-03-19 NOTE — Therapy (Addendum)
[place] MAIN [facility] SERVICES [address] [place], [name], [address] Phone: [phone]   Fax:  [phone]  Physical Therapy Treatment  Patient Details  Name: Charlene [name] MRN: [id number] Date of Birth: 06-Feb-1960 Referring Provider (PT): Dr. Gurney Maxin   Encounter Date: 03/19/2018  PT End of Session - 03/19/18 1137    Visit Number  2    Number of Visits  8    Date for PT Re-Evaluation  05/05/18    Authorization Type  2/10 eval start 2/19    PT Start Time  1028    PT Stop Time  1114    PT Time Calculation (min)  46 min    Equipment Utilized During Treatment  Gait belt    Activity Tolerance  Patient tolerated treatment well    Behavior During Therapy  [facility] for tasks assessed/performed       Past Medical History:  Diagnosis Date  . Arthritis    knees  . Degenerative disc disease, lumbar   . Diabetes mellitus without complication ([place])   . GERD (gastroesophageal reflux disease)   . Hypertension   . Neuropathy    left lower leg  . Polyneuropathy   . Polyneuropathy     Past Surgical History:  Procedure Laterality Date  . ABDOMINAL HYSTERECTOMY    . arthroscopic rotator cuff    . CATARACT EXTRACTION W/PHACO Right 11/25/2017   Procedure: CATARACT EXTRACTION PHACO AND INTRAOCULAR LENS PLACEMENT ([place]) RIGHT DIABETIC;  Surgeon: Leandrew Koyanagi, MD;  Location: [place];  Service: Ophthalmology;  Laterality: Right;  Diabetic - insulin and oral meds  . COLONOSCOPY    . endosopic sinus      Vitals:   03/19/18 1030 03/19/18 1110  BP: (!) 160/84 (!) 194/98  Initial BP at 1030 at rest prior to physical activity End BP at 1110 at rest following performance of physical activity    TherEx: Subjective Assessment - 03/19/18 1133    Subjective  The patient reports that she is doing well today. She reports that she had increased L leg pain yesterday which she says comes and goes intermittently. However, she denies  pain today. She notes that she has been consistently performing HEP 1x/day, and feels that she gets a good workout from her exercises. She denies questions about her HEP. She notes that she did not take her blood pressure medication prior to today's session as she was running behind this morning. She notes that she will be receiving back injections next week and will have to cancel next week's appointment.    Pertinent History  The patient presents to PT with a medical diagnosis of severe diabetic LLE neuropathy that began approximately 1 year ago. She has a PMH of Type 2 Diabetes and Hypertension. Patient reports that upon onset, she had trouble walking and began to limp, and notes that it has gotten progressively worse over the past year. She rates her current pain at rest at 2/10 and states that it is constant, and that she finds occasional relief with rest, heat, and stretching. She describes her pain as burning, tingling, numbness, and swelling. Her pain level increases to 4/10 with walking and physical activity, and gets up to 5/10 pain at worst. She also reports a recent onset of L knee and hip pain unrelated to her neuropathy. 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 She notes that when she walks, her L foot drags which causes her to stumble. In the past 6 months she has had 2 falls, one walking out of church and one at [place]. She attributes her fall at church to her LLE giving out/becoming weak, and her fall at [facility] to her stumbling and losing her balance. She currently has to hold onto things in the shower so that she does not lose her balance. She uses a single point cane at home and in the community for mobility. Of note, she received cataract surgery in November, and notes that she typically wears glasses.     Limitations   Lifting;Standing;Walking;House hold activities    How long can you sit comfortably?  2 hours    How long can you stand comfortably?  30 minutes    How long can you walk comfortably?  45 minutes    Patient Stated Goals  be able to walk longer distances, improve balance, to be able to shop, negotiate stairs without assistance    Currently in Pain?  No/denies    Pain Onset  --      Review and Performance of Current HEP:  Seated Heel Toe Raises - 10 reps - 2 sets - 5 hold - 1x daily - 7x weekly; patient had only been performing toe raise portion, educated on addition of heel raise; patient benefits from verbal cue to reposition LLE during performance to promote dorsiflexion   Seated Ankle Alphabet - 1 reps - 2 sets - 1 hold - 1x daily - 7x weekly   Standing March with Counter Support - 10 reps - 2 sets - 5 hold - 1x daily - 7x weekly   Standing Hip Abduction with Counter Support - 10 reps - 2 sets - 5 hold - 1x daily - 7x weekly; patient benefits from rare verbal cue to keep toe pointed forward during performance on RLE, keeps toe forward during performance on LLE   Standing Hip Extension with Counter Support - 10 reps - 2 sets - 5 hold - 1x daily - 7x weekly; patient requires intermittent verbal and tactile cues to maintain upright posture   Side to Side Weight Shift with Counter Support - 10 reps - 2 sets - 5 hold - 1x daily - 7x weekly; patient requires intermittent verbal and tactile cues to spread feet further apart and to shift trunk more over LLE during weight shift to promote weight bearing through LLE   -Educated and demonstrated proper squat/sit to stand technique; patient initially demonstrates hip extension during performance (doesn't stick bottom out towards surface) and squat form with knees in front of toes  -Sit to stands from elevated surface without UE support 1x10; patient demonstrates improved form following education; patient requires rare tactile cue to keep upright posture  during performance --> Sink Squats 1x10 with BUE for balance  -Standing hamstring curls 1x10 BLE; patient requires initial verbal and tactile cueing to isolate knee flexion/prevent hip flexion compensation; patient displays and acknowledges increased difficulty with performance on LLE  -Alternating forward tapping onto 4" step x60 seconds with BUE for support   Neuro Re-Ed: the following performed in parallel bars with CGA and gait belt for safety/stability Normal surface feet together balance x45 seconds  Normal surface semi-tandem stance balance x35 seconds on each LE; patient demonstrates increased postural sway/difficulty during performance with LLE behind  Airex pad balance: normal stance x45 seconds; patient  states that it is easy  Airex pad balance: feet together stance x45 seconds; patient demonstrates increased postural sway and acknowledges that it is a good challenge for her   Education regarding strengthening of muscles involving both the brain-muscle neurological connection/coordination and muscle hypertrophy  Education regarding importance of drinking water and taking hypertension medications prior to therapy sessions, and the effect of physical activity/exercise on blood pressure                        PT Education - 03/19/18 1136    Education Details  HEP, exercise technique, stability, strengthening, stability    Person(s) Educated  Patient    Methods  Explanation;Demonstration;Tactile cues;Verbal cues;Handout    Comprehension  Verbalized understanding;Returned demonstration;Verbal cues required;Tactile cues required;Need further instruction       PT Short Term Goals - 03/10/18 1617      PT SHORT TERM GOAL #1   Title  Patient will be independent in home exercise program to improve strength/mobility for better functional independence with ADLs.     Baseline  2/19: HEP given    Time  2    Period  Weeks    Status  New    Target Date  03/24/18       PT SHORT TERM GOAL #2   Title  Patient will report a worst pain of 3/10 on the VAS during ambulation/functional mobility.    Baseline  03/10/2018: 4/10    Time  2    Period  Weeks    Status  New    Target Date  03/24/18        PT Long Term Goals - 03/10/18 1623      PT LONG TERM GOAL #1   Title  Patient will report a worst pain of 1/10 on VAS with ambulation/functional mobility for improved quality of life.     Baseline  03/10/2018: 4/10    Time  8    Period  Weeks    Status  New    Target Date  05/05/18      PT LONG TERM GOAL #2   Title  Patient will improve score on 10MWT to >0.85m/s to improve independence with ambulation and to decrease risk of falls.    Baseline  03/10/2018: .438 m/s    Time  8    Period  Weeks    Status  New    Target Date  05/05/18      PT LONG TERM GOAL #3   Title  Patient will improve score on Berg Balance Test to at least 50/56 to demonstrate improved balance and safety with functional mobility.    Baseline  03/10/2018: 42/56    Time  8    Period  Weeks    Status  New    Target Date  05/05/18      PT LONG TERM GOAL #4   Title  The patient will demonstrate improve LLE dorsiflexion strength to at least 3+/5 to improve gait mechanics and safety with ambulation.    Baseline  03/10/2018: 2+/5    Time  8    Period  Weeks    Status  New    Target Date  05/05/18      PT LONG TERM GOAL #5   Title  Patient will negotiate 4 steps with supervision using unilateral UE support (L railing ascending, R railing descending) to promote safety and independence in home environment.    Baseline  03/10/2018:  patient reports she requires external assistance and significant UE assistance    Time  8    Period  Weeks    Status  New    Target Date  05/05/18            Plan - 03/19/18 1147    Clinical Impression Statement  The patient presents to PT motivated and appreciates a good challenge. The patient is consistent with her HEP and performs her exercises  with good form. Following education and demonstration, the patient demonstrated greatly improved form with squatting/sit to stands and was able to perform without verbal and tactile cues prior to the end of today's session. The patient also tolerated the addition of balance exercises today including the addition of the Airex pad. The patient did not take her hypertension medication today, and her blood pressure reached 194/98 by the end of today's session. Educated the patient on the importance of taking her blood pressure medication, especially prior to PT sessions. The patient's vital sign should continue to be monitored during PT sessions moving forward. The patient will continue to benefit from skilled PT in order to work towards goals, to increase BLE strength and balance, and to maximize safety and independence with functional mobility.     Rehab Potential  Fair    Clinical Impairments Affecting Rehab Potential  (+) family support, motivation, age; (-) financial limitations, other medical comorbidities, chronicity    PT Frequency  1x / week    PT Duration  8 weeks    PT Treatment/Interventions  ADLs/Self Care Home Management;Aquatic Therapy;Biofeedback;Cryotherapy;Electrical Stimulation;Iontophoresis 4mg /ml Dexamethasone;Moist Heat;Traction;Ultrasound;DME Instruction;Gait training;Stair training;Functional mobility training;Therapeutic activities;Therapeutic exercise;Balance training;Neuromuscular re-education;Patient/family education;Cognitive remediation;Orthotic Fit/Training;Manual techniques;Passive range of motion;Compression bandaging;Energy conservation;Splinting;Taping;Visual/perceptual remediation/compensation    PT Next Visit Plan  review HEP; progress BLE strengthening and balance, nerve glides, introduce dynamic balance/balance with ambulation    PT Home Exercise Plan  see instructions section; addition of standing hamstring curls and sink squats today    Consulted and Agree with Plan of  Care  Patient       Patient will benefit from skilled therapeutic intervention in order to improve the following deficits and impairments:  Abnormal gait, Cardiopulmonary status limiting activity, Decreased activity tolerance, Decreased balance, Decreased endurance, Decreased coordination, Decreased knowledge of use of DME, Decreased mobility, Decreased strength, Difficulty walking, Increased muscle spasms, Impaired perceived functional ability, Impaired flexibility, Increased edema, Decreased range of motion, Impaired vision/preception, Improper body mechanics, Postural dysfunction, Pain, Impaired sensation  Visit Diagnosis: Muscle weakness (generalized)  Other abnormalities of gait and mobility     Problem List Patient Active Problem List   Diagnosis Date Noted  . Abnormal EMG (07/06/2017) 11/23/2017  . Long-term insulin use ([place]) 11/10/2017  . Uncontrolled type 2 diabetes mellitus with hyperglycemia ([place]) 11/10/2017  . Vitamin D deficiency 10/21/2017  . DM type 2 with diabetic peripheral neuropathy ([place]) 10/21/2017  . DDD (degenerative disc disease), lumbar 10/21/2017  . Lumbar facet arthropathy 10/21/2017  . Lumbar facet syndrome (Bilateral) 10/21/2017  . Lumbar facet hypertrophy 10/21/2017  . Lumbar foraminal stenosis (L5-S1) (Right) 10/21/2017  . Osteoarthritis of knee (Bilateral) 10/21/2017  . Chronic musculoskeletal pain 10/21/2017  . Essential (primary) hypertension 10/12/2017  . Chronic ankle pain (Primary Area of Pain) (Left) 10/12/2017  . Chronic foot pain (Primary Area of Pain) (Left) 10/12/2017  . Chronic lower extremity pain (Secondary Area of Pain) (Bilateral) (L>R) 10/12/2017  . Chronic knee pain (Fourth Area of Pain) (Bilateral) (L>R) 10/12/2017  . Chronic low back pain [facility]  Area of Pain) (Bilateral) (L>R) w/ sciatica (Left) 10/12/2017  . Chronic pain syndrome 10/12/2017  . Opiate use 10/12/2017  . Pharmacologic therapy 10/12/2017  . Disorder of skeletal  system 10/12/2017  . Problems influencing health status 10/12/2017  . Neuropathy 08/11/2017  . Burning sensation of feet 07/06/2017  . Lower extremity numbness and tingling (Left) 07/06/2017  . Polyneuropathy associated with underlying disease ([place]) 05/29/2017  . DM type 2, uncontrolled, with neuropathy ([place]) 05/05/2017  . Neuropathic pain 06/29/2015  . GERD without esophagitis 01/16/2014   Orlean Patten, SPT  This entire session was performed under direct supervision and direction of a licensed therapist/therapist assistant . I have personally read, edited and approve of the note as written.  Janna Arch, PT, DPT   03/19/2018, 12:08 PM  [place] MAIN [facility] SERVICES [address] [place], [name], [address] Phone: [phone]   Fax:  [phone]  Name: Charlene [name] MRN: [id number] Date of Birth: [birth year]

## 2018-03-22 DIAGNOSIS — E113293 Type 2 diabetes mellitus with mild nonproliferative diabetic retinopathy without macular edema, bilateral: Secondary | ICD-10-CM | POA: Insufficient documentation

## 2018-03-22 DIAGNOSIS — Z794 Long term (current) use of insulin: Secondary | ICD-10-CM

## 2018-03-23 ENCOUNTER — Other Ambulatory Visit: Payer: Self-pay

## 2018-03-23 ENCOUNTER — Encounter: Payer: Self-pay | Admitting: Pain Medicine

## 2018-03-23 ENCOUNTER — Ambulatory Visit
Admission: RE | Admit: 2018-03-23 | Discharge: 2018-03-23 | Disposition: A | Discharge status: Home / Self Care | Payer: BLUE CROSS/BLUE SHIELD | Source: Ambulatory Visit | Attending: Pain Medicine | Admitting: Pain Medicine

## 2018-03-23 ENCOUNTER — Ambulatory Visit (HOSPITAL_BASED_OUTPATIENT_CLINIC_OR_DEPARTMENT_OTHER): Payer: BLUE CROSS/BLUE SHIELD | Admitting: Pain Medicine

## 2018-03-23 VITALS — BP 163/99 | HR 84 | Temp 98.3°F | Resp 12 | Ht 59.0 in | Wt 115.0 lb

## 2018-03-23 DIAGNOSIS — G8929 Other chronic pain: Secondary | ICD-10-CM | POA: Insufficient documentation

## 2018-03-23 DIAGNOSIS — M79605 Pain in left leg: Secondary | ICD-10-CM

## 2018-03-23 DIAGNOSIS — M5442 Lumbago with sciatica, left side: Secondary | ICD-10-CM

## 2018-03-23 DIAGNOSIS — M79604 Pain in right leg: Secondary | ICD-10-CM | POA: Insufficient documentation

## 2018-03-23 DIAGNOSIS — R200 Anesthesia of skin: Secondary | ICD-10-CM | POA: Insufficient documentation

## 2018-03-23 DIAGNOSIS — M5136 Other intervertebral disc degeneration, lumbar region: Secondary | ICD-10-CM

## 2018-03-23 DIAGNOSIS — R202 Paresthesia of skin: Secondary | ICD-10-CM | POA: Insufficient documentation

## 2018-03-23 DIAGNOSIS — M48061 Spinal stenosis, lumbar region without neurogenic claudication: Secondary | ICD-10-CM

## 2018-03-23 MED ORDER — MIDAZOLAM HCL 5 MG/5ML IJ SOLN
1.0000 mg | INTRAMUSCULAR | Status: DC | PRN
  Administered 2018-03-23: 2 mg via INTRAVENOUS
  Filled 2018-03-23: qty 5

## 2018-03-23 MED ORDER — TRIAMCINOLONE ACETONIDE 40 MG/ML IJ SUSP
40.0000 mg | Freq: Once | INTRAMUSCULAR | Status: AC
  Administered 2018-03-23: 40 mg
  Filled 2018-03-23: qty 1

## 2018-03-23 MED ORDER — FENTANYL CITRATE (PF) 100 MCG/2ML IJ SOLN
25.0000 ug | INTRAMUSCULAR | Status: DC | PRN
  Administered 2018-03-23: 50 ug via INTRAVENOUS
  Filled 2018-03-23: qty 2

## 2018-03-23 MED ORDER — ROPIVACAINE HCL 2 MG/ML IJ SOLN
1.0000 mL | Freq: Once | INTRAMUSCULAR | Status: AC
  Administered 2018-03-23: 1 mL via EPIDURAL
  Filled 2018-03-23: qty 10

## 2018-03-23 MED ORDER — IOPAMIDOL (ISOVUE-M 200) INJECTION 41%
10.0000 mL | Freq: Once | INTRAMUSCULAR | Status: AC
  Administered 2018-03-23: 10 mL via EPIDURAL
  Filled 2018-03-23: qty 10

## 2018-03-23 MED ORDER — LACTATED RINGERS IV SOLN
1000.0000 mL | Freq: Once | INTRAVENOUS | Status: AC
  Administered 2018-03-23: 1000 mL via INTRAVENOUS

## 2018-03-23 MED ORDER — SODIUM CHLORIDE 0.9% FLUSH
1.0000 mL | Freq: Once | INTRAVENOUS | Status: AC
  Administered 2018-03-23: 1 mL

## 2018-03-23 MED ORDER — ROPIVACAINE HCL 2 MG/ML IJ SOLN
2.0000 mL | Freq: Once | INTRAMUSCULAR | Status: AC
  Administered 2018-03-23: 2 mL via EPIDURAL
  Filled 2018-03-23: qty 10

## 2018-03-23 MED ORDER — SODIUM CHLORIDE 0.9% FLUSH
2.0000 mL | Freq: Once | INTRAVENOUS | Status: AC
  Administered 2018-03-23: 2 mL

## 2018-03-23 MED ORDER — DEXAMETHASONE SODIUM PHOSPHATE 10 MG/ML IJ SOLN
10.0000 mg | Freq: Once | INTRAMUSCULAR | Status: AC
  Administered 2018-03-23: 10 mg
  Filled 2018-03-23: qty 1

## 2018-03-23 MED ORDER — LIDOCAINE HCL 2 % IJ SOLN
20.0000 mL | Freq: Once | INTRAMUSCULAR | Status: AC
  Administered 2018-03-23: 400 mg
  Filled 2018-03-23: qty 40

## 2018-03-23 NOTE — Progress Notes (Signed)
Safety precautions to be maintained throughout the outpatient stay will include: orient to surroundings, keep bed in low position, maintain call bell within reach at all times, provide assistance with transfer out of bed and ambulation.  

## 2018-03-23 NOTE — Progress Notes (Signed)
Patient's Name: Charlene [name]  MRN: [id number]  Referring Provider: Milinda Pointer, MD  DOB: 21-Jul-1960  PCP: Tracie Harrier, MD  DOS: 03/23/2018  Note by: Gaspar Cola, MD  Service setting: Ambulatory outpatient  Specialty: Interventional Pain Management  Patient type: Established  Location: [facility] (AMB) Pain Management Facility  Visit type: Interventional Procedure   Primary Reason for Visit: Interventional Pain Management Treatment. CC: Ankle Pain (left); Knee Pain (left); and Back Pain (low)  Procedure #1:  Anesthesia, Analgesia, Anxiolysis:  Type: Diagnostic Trans-Foraminal Epidural Steroid Injection   #1  Region: Lumbar Level: L5 Paravertebral Laterality: Left Paravertebral   Type: Moderate (Conscious) Sedation combined with Local Anesthesia Indication(s): Analgesia and Anxiety Route: Intravenous (IV) IV Access: Secured Sedation: Meaningful verbal contact was maintained at all times during the procedure  Local Anesthetic: Lidocaine 1-2%  Position: Prone  Procedure #2:    Type: Therapeutic Inter-Laminar Epidural Steroid Injection   #2  Region: Lumbar Level: L4-5 Level. Laterality: Left Paramedial     Indications: 1. DDD (degenerative disc disease), lumbar   2. 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 Chronic lower extremity pain (Secondary Area of Pain) (Bilateral) (L>R)   4. Lumbar foraminal stenosis (L5-S1) (Right)    Pain Score: Pre-procedure: 1 /10 Post-procedure: 0-No pain/10  Pre-op Assessment:  Charlene [name] is a 58 y.o. (year old), female patient, seen today for interventional treatment. She  has a past surgical history that includes arthroscopic rotator cuff; Colonoscopy; Abdominal hysterectomy; endosopic sinus; and Cataract extraction w/PHACO (Right, 11/25/2017). Charlene [name] has a current medication list which includes the following prescription(s): amlodipine, baclofen, gnp calcium 1200, vitamin d3,  duloxetine, gabapentin, insulin glargine (2 unit dial), lidocaine-menthol, liraglutide, lisinopril, magnesium, meloxicam, metoprolol succinate, pantoprazole, polyethylene glycol, and tramadol, and the following Facility-Administered Medications: fentanyl and midazolam. Her primarily concern today is the Ankle Pain (left); Knee Pain (left); and Back Pain (low)  Initial Vital Signs:  Pulse/HCG Rate: 84ECG Heart Rate: 88 Temp: 98.3 F (36.8 C) Resp: 18 BP: (!) 148/87 SpO2: 98 %  BMI: Estimated body mass index is 23.23 kg/m as calculated from the following:   Height as of this encounter: 4\' 11"  (1.499 m).   Weight as of this encounter: 115 lb (52.2 kg).  Risk Assessment: Allergies: Reviewed. She has No Known Allergies.  Allergy Precautions: None required Coagulopathies: Reviewed. None identified.  Blood-thinner therapy: None at this time Active Infection(s): Reviewed. None identified. Charlene [name] is afebrile  Site Confirmation: Charlene [name] was asked to confirm the procedure and laterality before marking the site Procedure checklist: Completed Consent: Before the procedure and under the influence of no sedative(s), amnesic(s), or anxiolytics, the patient was informed of the treatment options, risks and possible complications. To fulfill our ethical and legal obligations, as recommended by the American Medical Association's Code of Ethics, I have informed the patient of my clinical impression; the nature and purpose of the treatment or procedure; the risks, benefits, and possible complications of the intervention; the alternatives, including doing nothing; the risk(s) and benefit(s) of the alternative treatment(s) or procedure(s); and the risk(s) and benefit(s) of doing nothing. The patient was provided information about the general risks and possible complications associated with the procedure. These may include, but are not limited to: failure to achieve desired goals, infection, bleeding, organ  or nerve damage, allergic reactions, paralysis, and death. In addition, the patient was informed of those risks and complications associated to Spine-related procedures, such as failure  to decrease pain; infection (i.e.: Meningitis, epidural or intraspinal abscess); bleeding (i.e.: epidural hematoma, subarachnoid hemorrhage, or any other type of intraspinal or peri-dural bleeding); organ or nerve damage (i.e.: Any type of peripheral nerve, nerve root, or spinal cord injury) with subsequent damage to sensory, motor, and/or autonomic systems, resulting in permanent pain, numbness, and/or weakness of one or several areas of the body; allergic reactions; (i.e.: anaphylactic reaction); and/or death. Furthermore, the patient was informed of those risks and complications associated with the medications. These include, but are not limited to: allergic reactions (i.e.: anaphylactic or anaphylactoid reaction(s)); adrenal axis suppression; blood sugar elevation that in diabetics may result in ketoacidosis or comma; water retention that in patients with history of congestive heart failure may result in shortness of breath, pulmonary edema, and decompensation with resultant heart failure; weight gain; swelling or edema; medication-induced neural toxicity; particulate matter embolism and blood vessel occlusion with resultant organ, and/or nervous system infarction; and/or aseptic necrosis of one or more joints. Finally, the patient was informed that Medicine is not an exact science; therefore, there is also the possibility of unforeseen or unpredictable risks and/or possible complications that may result in a catastrophic outcome. The patient indicated having understood very clearly. We have given the patient no guarantees and we have made no promises. Enough time was given to the patient to ask questions, all of which were answered to the patient's satisfaction. Charlene [name] has indicated that she wanted to continue with the  procedure. Attestation: I, the ordering provider, attest that I have discussed with the patient the benefits, risks, side-effects, alternatives, likelihood of achieving goals, and potential problems during recovery for the procedure that I have provided informed consent. Date  Time: 03/23/2018  8:14 AM  Pre-Procedure Preparation:  Monitoring: As per clinic protocol. Respiration, ETCO2, SpO2, BP, heart rate and rhythm monitor placed and checked for adequate function Safety Precautions: Patient was assessed for positional comfort and pressure points before starting the procedure. Time-out: I initiated and conducted the "Time-out" before starting the procedure, as per protocol. The patient was asked to participate by confirming the accuracy of the "Time Out" information. Verification of the correct person, site, and procedure were performed and confirmed by me, the nursing staff, and the patient. "Time-out" conducted as per Joint Commission's Universal Protocol (UP.01.01.01). Time: 8144  Description of Procedure #1:  Target Area: The inferior and lateral portion of the pedicle, just lateral to a line created by the 6:00 position of the pedicle and the superior articular process of the vertebral body below. On the lateral view, this target lies just posterior to the anterior aspect of the lamina and posterior to the midpoint created between the anterior and the posterior aspect of the neural foramina. Approach: Posterior paravertebral approach. Area Prepped: Entire Posterior Lumbosacral Region Prepping solution: ChloraPrep (2% chlorhexidine gluconate and 70% isopropyl alcohol) Safety Precautions: Aspiration looking for blood return was conducted prior to all injections. At no point did we inject any substances, as a needle was being advanced. No attempts were made at seeking any paresthesias. Safe injection practices and needle disposal techniques used. Medications properly checked for expiration dates.  SDV (single dose vial) medications used.  Description of the Procedure: Protocol guidelines were followed. The patient was placed in position over the fluoroscopy table. The target area was identified and the area prepped in the usual manner. Skin & deeper tissues infiltrated with local anesthetic. Appropriate amount of time allowed to pass for local anesthetics to take effect. The procedure  needles were then advanced to the target area. Proper needle placement secured. Negative aspiration confirmed. Solution injected in intermittent fashion, asking for systemic symptoms every 0.2cc of injectate. The needles were then removed and the area cleansed, making sure to leave some of the prepping solution back to take advantage of its long term bactericidal properties.  Start Time: 0856 hrs.  Materials:  Needle(s) Type: Spinal Needle Gauge: 22G Length: 3.5-in Medication(s): Please see orders for medications and dosing details.  Description of Procedure #2:  Target Area: The  interlaminar space, initially targeting the lower border of the superior vertebral body lamina. Approach: Posterior paramedial approach. Area Prepped: Same as above Prepping solution: Same as above Safety Precautions: Same as above  Description of the Procedure: Protocol guidelines were followed. The patient was placed in position over the fluoroscopy table. The target area was identified and the area prepped in the usual manner. Skin & deeper tissues infiltrated with local anesthetic. Appropriate amount of time allowed to pass for local anesthetics to take effect. The procedure needle was introduced through the skin, ipsilateral to the reported pain, and advanced to the target area. Bone was contacted and the needle walked caudad, until the lamina was cleared. The ligamentum flavum was engaged and loss-of-resistance technique used as the epidural needle was advanced. The epidural space was identified using "loss-of-resistance  technique" with 2-3 ml of PF-NaCl (0.9% NSS), in a 5cc LOR glass syringe. Proper needle placement secured. Negative aspiration confirmed. Solution injected in intermittent fashion, asking for systemic symptoms every 0.5cc of injectate. The needles were then removed and the area cleansed, making sure to leave some of the prepping solution back to take advantage of its long term bactericidal properties.  Vitals:   03/23/18 0907 03/23/18 0917 03/23/18 0927 03/23/18 0937  BP: (!) 146/85 (!) 151/91 (!) 171/101 (!) 163/99  Pulse:      Resp: 11 10 (!) 9 12  Temp:  98 F (36.7 C)  98.3 F (36.8 C)  SpO2: 98% 100% 100% 100%  Weight:      Height:        End Time: 0906 hrs.  Materials:  Needle(s) Type: Epidural needle Gauge: 17G Length: 3.5-in Medication(s): Please see orders for medications and dosing details.  Imaging Guidance (Spinal):          Type of Imaging Technique: Fluoroscopy Guidance (Spinal) Indication(s): Assistance in needle guidance and placement for procedures requiring needle placement in or near specific anatomical locations not easily accessible without such assistance. Exposure Time: Please see nurses notes. Contrast: Before injecting any contrast, we confirmed that the patient did not have an allergy to iodine, shellfish, or radiological contrast. Once satisfactory needle placement was completed at the desired level, radiological contrast was injected. Contrast injected under live fluoroscopy. No contrast complications. See chart for type and volume of contrast used. Fluoroscopic Guidance: I was personally present during the use of fluoroscopy. "Tunnel Vision Technique" used to obtain the best possible view of the target area. Parallax error corrected before commencing the procedure. "Direction-depth-direction" technique used to introduce the needle under continuous pulsed fluoroscopy. Once target was reached, antero-posterior, oblique, and lateral fluoroscopic projection used  confirm needle placement in all planes. Images permanently stored in EMR. Interpretation: I personally interpreted the imaging intraoperatively. Adequate needle placement confirmed in multiple planes. Appropriate spread of contrast into desired area was observed. No evidence of afferent or efferent intravascular uptake. No intrathecal or subarachnoid spread observed. Permanent images saved into the patient's record.  Antibiotic Prophylaxis:  Anti-infectives (From admission, onward)   None     Indication(s): None identified  Post-operative Assessment:  Post-procedure Vital Signs:  Pulse/HCG Rate: 8484 Temp: 98.3 F (36.8 C) Resp: 12 BP: (!) 163/99 SpO2: 100 %  EBL: None  Complications: No immediate post-treatment complications observed by team, or reported by patient.  Note: The patient tolerated the entire procedure well. A repeat set of vitals were taken after the procedure and the patient was kept under observation following institutional policy, for this type of procedure. Post-procedural neurological assessment was performed, showing return to baseline, prior to discharge. The patient was provided with post-procedure discharge instructions, including a section on how to identify potential problems. Should any problems arise concerning this procedure, the patient was given instructions to immediately contact us, at any time, without hesitation. In any case, we plan to contact the patient by telephone for a follow-up status report regarding this interventional procedure.  Comments:  No additional relevant information.  Plan of Care    Imaging Orders     DG C-Arm 1-60 Min-No Report  Procedure Orders     Lumbar Epidural Injection     Lumbar Transforaminal Epidural  Medications ordered for procedure: Meds ordered this encounter  Medications  . iopamidol (ISOVUE-M) 41 % intrathecal injection 10 mL    Must be Myelogram-compatible. If not available, you may substitute with a  water-soluble, non-ionic, hypoallergenic, myelogram-compatible radiological contrast medium.  Marland Kitchen lidocaine (XYLOCAINE) 2 % (with pres) injection 400 mg  . midazolam (VERSED) 5 MG/5ML injection 1-2 mg    Make sure Flumazenil is available in the pyxis when using this medication. If oversedation occurs, administer 0.2 mg IV over 15 sec. If after 45 sec no response, administer 0.2 mg again over 1 min; may repeat at 1 min intervals; not to exceed 4 doses (1 mg)  . fentaNYL (SUBLIMAZE) injection 25-50 mcg    Make sure Narcan is available in the pyxis when using this medication. In the event of respiratory depression (RR< 8/min): Titrate NARCAN (naloxone) in increments of 0.1 to 0.2 mg IV at 2-3 minute intervals, until desired degree of reversal.  . lactated ringers infusion 1,000 mL  . sodium chloride flush (NS) 0.9 % injection 1 mL  . ropivacaine (PF) 2 mg/mL (0.2%) (NAROPIN) injection 1 mL  . dexamethasone (DECADRON) injection 10 mg  . sodium chloride flush (NS) 0.9 % injection 2 mL  . ropivacaine (PF) 2 mg/mL (0.2%) (NAROPIN) injection 2 mL  . triamcinolone acetonide (KENALOG-40) injection 40 mg   Medications administered: We administered iopamidol, lidocaine, midazolam, fentaNYL, lactated ringers, sodium chloride flush, ropivacaine (PF) 2 mg/mL (0.2%), dexamethasone, sodium chloride flush, ropivacaine (PF) 2 mg/mL (0.2%), and triamcinolone acetonide.  See the medical record for exact dosing, route, and time of administration.  Disposition: Discharge home  Discharge Date & Time: 03/23/2018; 0938 hrs.   Physician-requested Follow-up: Return for PPE (2 wks) w/ Dr. Dossie Arbour.  Future Appointments  Date Time Provider [place]  03/31/2018 11:15 AM Janna Arch, PT [facility] None  04/05/2018  1:30 PM Milinda Pointer, MD [facility] None  04/07/2018  1:00 PM Janna Arch, PT [facility] None  04/14/2018  1:00 PM Janna Arch, PT [facility] None  04/21/2018  1:00 PM Janna Arch, PT [facility] None   04/28/2018  1:00 PM Janna Arch, PT [facility] None  05/05/2018  1:00 PM Janna Arch, PT [facility] None  05/12/2018  1:00 PM Janna Arch, PT [facility] None  05/18/2018  9:45 AM Vevelyn Francois, NP [facility] None  Primary Care Physician: Tracie Harrier, MD Location: [facility] Note by: Gaspar Cola, MD Date: 03/23/2018; Time: 12:40 PM  Disclaimer:  Medicine is not an Chief Strategy Officer. The only guarantee in medicine is that nothing is guaranteed. It is important to note that the decision to proceed with this intervention was based on the information collected from the patient. The Data and conclusions were drawn from the patient's questionnaire, the interview, and the physical examination. Because the information was provided in large part by the patient, it cannot be guaranteed that it has not been purposely or unconsciously manipulated. Every effort has been made to obtain as much relevant data as possible for this evaluation. It is important to note that the conclusions that lead to this procedure are derived in large part from the available data. Always take into account that the treatment will also be dependent on availability of resources and existing treatment guidelines, considered by other Pain Management Practitioners as being common knowledge and practice, at the time of the intervention. For Medico-Legal purposes, it is also important to point out that variation in procedural techniques and pharmacological choices are the acceptable norm. The indications, contraindications, technique, and results of the above procedure should only be interpreted and judged by a Board-Certified Interventional Pain Specialist with extensive familiarity and expertise in the same exact procedure and technique.

## 2018-03-23 NOTE — Patient Instructions (Signed)

## 2018-03-24 ENCOUNTER — Ambulatory Visit: Payer: BLUE CROSS/BLUE SHIELD

## 2018-03-24 ENCOUNTER — Telehealth: Payer: Self-pay

## 2018-03-24 NOTE — Telephone Encounter (Signed)
Denies any needs at this time. Insructed to call if needed.

## 2018-03-31 ENCOUNTER — Ambulatory Visit: Payer: BLUE CROSS/BLUE SHIELD

## 2018-04-04 NOTE — Progress Notes (Deleted)
Patient's Name: Charlene [name]  MRN: [id number]  Referring Provider: Tracie Harrier, MD  DOB: 07/21/1960  PCP: Tracie Harrier, MD  DOS: 04/05/2018  Note by: Gaspar Cola, MD  Service setting: Ambulatory outpatient  Specialty: Interventional Pain Management  Location: [facility] (AMB) Pain Management Facility    Patient type: Established   Primary Reason(s) for Visit: Encounter for post-procedure evaluation of chronic illness with mild to moderate exacerbation CC: No chief complaint on file.  HPI  Charlene [name] is a 58 y.o. year old, female patient, who comes today for a post-procedure evaluation. She has Burning sensation of feet; DM type 2, uncontrolled, with neuropathy ([place]); GERD without esophagitis; Essential (primary) hypertension; Neuropathic pain; Neuropathy; Lower extremity numbness and tingling (Left); Polyneuropathy associated with underlying disease ([place]); Chronic ankle pain (Primary Area of Pain) (Left); Chronic foot pain (Primary Area of Pain) (Left); Chronic lower extremity pain (Secondary Area of Pain) (Bilateral) (L>R); Chronic knee pain (Fourth Area of Pain) (Bilateral) (L>R); Chronic low back pain [facility] Area of Pain) (Bilateral) (L>R) w/ sciatica (Left); Chronic pain syndrome; Opiate use; Pharmacologic therapy; Disorder of skeletal system; Problems influencing health status; Vitamin D deficiency; DM type 2 with diabetic peripheral neuropathy ([place]); DDD (degenerative disc disease), lumbar; Lumbar facet arthropathy; Lumbar facet syndrome (Bilateral); Lumbar facet hypertrophy; Lumbar foraminal stenosis (L5-S1) (Right); Osteoarthritis of knee (Bilateral); Chronic musculoskeletal pain; Long-term insulin use ([place]); Uncontrolled type 2 diabetes mellitus with hyperglycemia ([place]); and Abnormal EMG (07/06/2017) on their problem list. Her primarily concern today is the No chief complaint on file.  Pain Assessment: Location:     Radiating:   Onset:   Duration:   Quality:   Severity:   /10 (subjective, self-reported pain score)  Note: Reported level is compatible with observation.                         When using our objective Pain Scale, levels between 6 and 10/10 are said to belong in an emergency room, as it progressively worsens from a 6/10, described as severely limiting, requiring emergency care not usually available at an outpatient pain management facility. At a 6/10 level, communication becomes difficult and requires great effort. Assistance to reach the emergency department may be required. Facial flushing and profuse sweating along with potentially dangerous increases in heart rate and blood pressure will be evident. Effect on ADL:   Timing:   Modifying factors:   BP:    HR:    Charlene [name] comes in today for post-procedure evaluation.  Further details on both, my assessment(s), as well as the proposed treatment plan, please see below.  Post-Procedure Assessment  03/23/2018 Procedure: Diagnostic left-sided L5 transforaminal ESI #1 + left-sided L4-5 interlaminar LESI #2 under fluoroscopic guidance and IV sedation. Pre-procedure pain score:  1/10 Post-procedure pain score: 0/10 (100% relief) Influential Factors: BMI:   Intra-procedural challenges: None observed.         Assessment challenges: None detected.              Reported side-effects: None.        Post-procedural adverse reactions or complications: None reported         Sedation: Sedation provided. When no sedatives are used, the analgesic levels obtained are directly associated to the effectiveness of the local anesthetics. However, when sedation is provided, the level of analgesia obtained during the initial 1 hour following the intervention, is believed to be the result of a combination of factors. These factors may include,  but are not limited to: 1. The effectiveness of the local anesthetics used. 2. The effects of the analgesic(s) and/or anxiolytic(s) used. 3. The degree of discomfort experienced by  the patient at the time of the procedure. 4. The patients ability and reliability in recalling and recording the events. 5. The presence and influence of possible secondary gains and/or psychosocial factors. Reported result: Relief experienced during the 1st hour after the procedure:   (Ultra-Short Term Relief)            Interpretative annotation: Clinically appropriate result. Analgesia during this period is likely to be Local Anesthetic and/or IV Sedative (Analgesic/Anxiolytic) related.          Effects of local anesthetic: The analgesic effects attained during this period are directly associated to the localized infiltration of local anesthetics and therefore cary significant diagnostic value as to the etiological location, or anatomical origin, of the pain. Expected duration of relief is directly dependent on the pharmacodynamics of the local anesthetic used. Long-acting (4-6 hours) anesthetics used.  Reported result: Relief during the next 4 to 6 hour after the procedure:   (Short-Term Relief)            Interpretative annotation: Clinically appropriate result. Analgesia during this period is likely to be Local Anesthetic-related.          Long-term benefit: Defined as the period of time past the expected duration of local anesthetics (1 hour for short-acting and 4-6 hours for long-acting). With the possible exception of prolonged sympathetic blockade from the local anesthetics, benefits during this period are typically attributed to, or associated with, other factors such as analgesic sensory neuropraxia, antiinflammatory effects, or beneficial biochemical changes provided by agents other than the local anesthetics.  Reported result: Extended relief following procedure:   (Long-Term Relief)            Interpretative annotation: Clinically possible results. Good relief. No permanent benefit expected. Inflammation plays a part in the etiology to the pain.          Current benefits: Defined as  reported results that persistent at this point in time.   Analgesia: *** %            Function: Somewhat improved ROM: Somewhat improved Interpretative annotation: Recurrence of symptoms. No permanent benefit expected. Effective diagnostic intervention.          Interpretation: Results would suggest a successful diagnostic intervention.                  Plan:  Please see "Plan of Care" for details.                Laboratory Chemistry  Inflammation Markers (CRP: Acute Phase) (ESR: Chronic Phase) Lab Results  Component Value Date   CRP 4 10/12/2017   ESRSEDRATE 12 10/12/2017                         Renal Markers Lab Results  Component Value Date   BUN 14 12/17/2017   CREATININE 0.45 12/17/2017   BCR 17 10/12/2017   GFRAA >60 12/17/2017   GFRNONAA >60 12/17/2017                             Hepatic Markers Lab Results  Component Value Date   AST 18 12/17/2017   ALT 18 12/17/2017   ALBUMIN 4.1 12/17/2017  Note: Lab results reviewed.  Recent Imaging Results   Results for orders placed in visit on 03/23/18  DG C-Arm 1-60 Min-No Report   Narrative Fluoroscopy was utilized by the requesting physician.  No radiographic  interpretation.    Interpretation Report: Fluoroscopy was used during the procedure to assist with needle guidance. The images were interpreted intraoperatively by the requesting physician.        Meds   Current Outpatient Medications:  .  amLODipine (NORVASC) 2.5 MG tablet, Take by mouth., Disp: , Rfl:  .  baclofen (LIORESAL) 10 MG tablet, Take 1 tablet (10 mg total) by mouth 3 (three) times daily., Disp: 90 tablet, Rfl: 2 .  [START ON 04/19/2018] Calcium Carbonate-Vit D-Min (GNP CALCIUM 1200) 1200-1000 MG-UNIT CHEW, Chew 1,200 mg by mouth daily with breakfast. Take in combination with vitamin D and magnesium., Disp: 30 tablet, Rfl: 5 .  [START ON 04/19/2018] Cholecalciferol (VITAMIN D3) 125 MCG (5000 UT) CAPS, Take 1 capsule (5,000  Units total) by mouth daily with breakfast. Take along with calcium and magnesium., Disp: 30 capsule, Rfl: 5 .  DULoxetine (CYMBALTA) 30 MG capsule, Take 30 mg by mouth daily., Disp: , Rfl:  .  gabapentin (NEURONTIN) 800 MG tablet, Take 800 mg by mouth 4 (four) times daily., Disp: , Rfl:  .  Insulin Glargine, 2 Unit Dial, (TOUJEO MAX SOLOSTAR) 300 UNIT/ML SOPN, Inject 140 Units into the skin daily. 30 units AM and 110 units HS, Disp: , Rfl:  .  Lidocaine-Menthol (ICY HOT LIDOCAINE PLUS MENTHOL EX), Apply 1 application topically., Disp: , Rfl:  .  liraglutide (VICTOZA) 18 MG/3ML SOPN, Inject 1.8 mg into the skin daily., Disp: , Rfl:  .  lisinopril (PRINIVIL,ZESTRIL) 40 MG tablet, Take 40 mg by mouth daily., Disp: , Rfl:  .  Magnesium 500 MG CAPS, Take 1 capsule (500 mg total) by mouth 2 (two) times daily at 8 am and 10 pm., Disp: 60 capsule, Rfl: 5 .  meloxicam (MOBIC) 15 MG tablet, Take 15 mg by mouth daily., Disp: , Rfl:  .  metoprolol succinate (TOPROL-XL) 50 MG 24 hr tablet, Take 25 mg by mouth 2 (two) times daily. , Disp: , Rfl:  .  pantoprazole (PROTONIX) 40 MG tablet, Take 40 mg by mouth 2 (two) times daily., Disp: , Rfl:  .  polyethylene glycol (MIRALAX / GLYCOLAX) packet, Take 17 g by mouth daily. Mix one tablespoon with 8oz of your favorite juice or water every day until you are having soft formed stools. Then start taking once daily if you didn't have a stool the day before., Disp: 30 each, Rfl: 0 .  traMADol (ULTRAM) 50 MG tablet, Take 1 tablet (50 mg total) by mouth every 8 (eight) hours as needed for severe pain., Disp: 90 tablet, Rfl: 2  ROS  Constitutional: Denies any fever or chills Gastrointestinal: No reported hemesis, hematochezia, vomiting, or acute GI distress Musculoskeletal: Denies any acute onset joint swelling, redness, loss of ROM, or weakness Neurological: No reported episodes of acute onset apraxia, aphasia, dysarthria, agnosia, amnesia, paralysis, loss of coordination,  or loss of consciousness  Allergies  Ms. Blumenberg has No Known Allergies.  [place]  Drug: Ms. Gaut  reports no history of drug use. Alcohol:  reports previous alcohol use. Tobacco:  reports that she has never smoked. She has never used smokeless tobacco. Medical:  has a past medical history of Arthritis, Degenerative disc disease, lumbar, Diabetes mellitus without complication ([place]), GERD (gastroesophageal reflux disease), Hypertension, Neuropathy, Polyneuropathy, and  Polyneuropathy. Surgical: Ms. Mccollom  has a past surgical history that includes arthroscopic rotator cuff; Colonoscopy; Abdominal hysterectomy; endosopic sinus; and Cataract extraction w/PHACO (Right, 11/25/2017). Family: family history includes Alcohol abuse in her father; Breast cancer in her maternal aunt; Cancer in her father.  Constitutional Exam  General appearance: Well nourished, well developed, and well hydrated. In no apparent acute distress There were no vitals filed for this visit. BMI Assessment: Estimated body mass index is 23.23 kg/m as calculated from the following:   Height as of 03/23/18: '4\' 11"'$  (1.499 m).   Weight as of 03/23/18: 115 lb (52.2 kg).  BMI interpretation table: BMI level Category Range association with higher incidence of chronic pain  <18 kg/m2 Underweight   18.5-24.9 kg/m2 Ideal body weight   25-29.9 kg/m2 Overweight Increased incidence by 20%  30-34.9 kg/m2 Obese (Class I) Increased incidence by 68%  35-39.9 kg/m2 Severe obesity (Class II) Increased incidence by 136%  >40 kg/m2 Extreme obesity (Class III) Increased incidence by 254%   Patient's current BMI Ideal Body weight  There is no height or weight on file to calculate BMI. Patient must be at least 60 in tall to calculate ideal body weight   BMI Readings from Last 4 Encounters:  03/23/18 23.23 kg/m  03/15/18 23.23 kg/m  02/25/18 23.23 kg/m  02/24/18 23.23 kg/m   Wt Readings from Last 4 Encounters:  03/23/18 115 lb (52.2 kg)   03/15/18 115 lb (52.2 kg)  02/25/18 115 lb (52.2 kg)  02/24/18 115 lb (52.2 kg)  Psych/Mental status: Alert, oriented x 3 (person, place, & time)       Eyes: PERLA Respiratory: No evidence of acute respiratory distress  Cervical Spine Area Exam  Skin & Axial Inspection: No masses, redness, edema, swelling, or associated skin lesions Alignment: Symmetrical Functional ROM: Unrestricted ROM      Stability: No instability detected Muscle Tone/Strength: Functionally intact. No obvious neuro-muscular anomalies detected. Sensory (Neurological): Unimpaired Palpation: No palpable anomalies              Upper Extremity (UE) Exam    Side: Right upper extremity  Side: Left upper extremity  Skin & Extremity Inspection: Skin color, temperature, and hair growth are WNL. No peripheral edema or cyanosis. No masses, redness, swelling, asymmetry, or associated skin lesions. No contractures.  Skin & Extremity Inspection: Skin color, temperature, and hair growth are WNL. No peripheral edema or cyanosis. No masses, redness, swelling, asymmetry, or associated skin lesions. No contractures.  Functional ROM: Unrestricted ROM          Functional ROM: Unrestricted ROM          Muscle Tone/Strength: Functionally intact. No obvious neuro-muscular anomalies detected.  Muscle Tone/Strength: Functionally intact. No obvious neuro-muscular anomalies detected.  Sensory (Neurological): Unimpaired          Sensory (Neurological): Unimpaired          Palpation: No palpable anomalies              Palpation: No palpable anomalies              Provocative Test(s):  Phalen's test: deferred Tinel's test: deferred Apley's scratch test (touch opposite shoulder):  Action 1 (Across chest): deferred Action 2 (Overhead): deferred Action 3 (LB reach): deferred   Provocative Test(s):  Phalen's test: deferred Tinel's test: deferred Apley's scratch test (touch opposite shoulder):  Action 1 (Across chest): deferred Action 2  (Overhead): deferred Action 3 (LB reach): deferred    Thoracic Spine Area Exam  Skin & Axial Inspection: No masses, redness, or swelling Alignment: Symmetrical Functional ROM: Unrestricted ROM Stability: No instability detected Muscle Tone/Strength: Functionally intact. No obvious neuro-muscular anomalies detected. Sensory (Neurological): Unimpaired Muscle strength & Tone: No palpable anomalies  Lumbar Spine Area Exam  Skin & Axial Inspection: No masses, redness, or swelling Alignment: Symmetrical Functional ROM: Unrestricted ROM       Stability: No instability detected Muscle Tone/Strength: Functionally intact. No obvious neuro-muscular anomalies detected. Sensory (Neurological): Unimpaired Palpation: No palpable anomalies       Provocative Tests: Hyperextension/rotation test: deferred today       Lumbar quadrant test (Kemp's test): deferred today       Lateral bending test: deferred today       Patrick's Maneuver: deferred today                   FABER* test: deferred today                   S-I anterior distraction/compression test: deferred today         S-I lateral compression test: deferred today         S-I Thigh-thrust test: deferred today         S-I Gaenslen's test: deferred today         *(Flexion, ABduction and External Rotation)  Gait & Posture Assessment  Ambulation: Unassisted Gait: Relatively normal for age and body habitus Posture: WNL   Lower Extremity Exam    Side: Right lower extremity  Side: Left lower extremity  Stability: No instability observed          Stability: No instability observed          Skin & Extremity Inspection: Skin color, temperature, and hair growth are WNL. No peripheral edema or cyanosis. No masses, redness, swelling, asymmetry, or associated skin lesions. No contractures.  Skin & Extremity Inspection: Skin color, temperature, and hair growth are WNL. No peripheral edema or cyanosis. No masses, redness, swelling, asymmetry, or  associated skin lesions. No contractures.  Functional ROM: Unrestricted ROM                  Functional ROM: Unrestricted ROM                  Muscle Tone/Strength: Functionally intact. No obvious neuro-muscular anomalies detected.  Muscle Tone/Strength: Functionally intact. No obvious neuro-muscular anomalies detected.  Sensory (Neurological): Unimpaired        Sensory (Neurological): Unimpaired        DTR: Patellar: deferred today Achilles: deferred today Plantar: deferred today  DTR: Patellar: deferred today Achilles: deferred today Plantar: deferred today  Palpation: No palpable anomalies  Palpation: No palpable anomalies   Assessment   Status Diagnosis  Controlled Controlled Controlled No diagnosis found.   Updated Problems: No problems updated.  Plan of Care  Pharmacotherapy (Medications Ordered): No orders of the defined types were placed in this encounter.  Medications administered today: Madalen L. Florence had no medications administered during this visit.  Orders:  No orders of the defined types were placed in this encounter.  Lab Orders  No laboratory test(s) ordered today   Imaging Orders  No imaging studies ordered today   Referral Orders  No referral(s) requested today   Planned follow-up:   No follow-ups on file. ***   Interventional management options: Considering:   Diagnostic left-sided lumbar sympathetic block Possible left-sided lumbar sympathetic RFA Diagnostic left-sided L4-5 interlaminar LESI Diagnostic right-sided L5 transforaminal ESI Diagnostic  bilateral lumbar facet block Possible bilateral lumbar facet RFA Diagnostic bilateral intra-articular knee joint injection with local anesthetic and steroid Possible series of 5 bilateral intra-articular Hyalgan knee injections Diagnostic bilateral genicular nerve block Possible bilateral genicular nerve RFA   Palliative PRN treatment(s):   Palliative bilateral Hyalgan knee series  #2 (last done on 02/25/2018)    Future Appointments  Date Time Provider [place]  04/05/2018  1:30 PM Milinda Pointer, MD [facility] None  05/18/2018  9:45 AM Vevelyn Francois, NP [facility] None   Primary Care Physician: Tracie Harrier, MD Location: [facility] Note by: Gaspar Cola, MD Date: 04/05/2018; Time: 6:57 PM

## 2018-04-05 ENCOUNTER — Ambulatory Visit: Payer: BLUE CROSS/BLUE SHIELD | Admitting: Pain Medicine

## 2018-05-18 ENCOUNTER — Other Ambulatory Visit: Payer: Self-pay

## 2018-05-18 ENCOUNTER — Ambulatory Visit: Payer: BLUE CROSS/BLUE SHIELD | Attending: Nurse Practitioner | Admitting: Nurse Practitioner

## 2018-05-18 DIAGNOSIS — M5136 Other intervertebral disc degeneration, lumbar region: Secondary | ICD-10-CM

## 2018-05-18 DIAGNOSIS — M7918 Myalgia, other site: Secondary | ICD-10-CM | POA: Diagnosis not present

## 2018-05-18 DIAGNOSIS — G894 Chronic pain syndrome: Secondary | ICD-10-CM

## 2018-05-18 DIAGNOSIS — R200 Anesthesia of skin: Secondary | ICD-10-CM

## 2018-05-18 DIAGNOSIS — M170 Bilateral primary osteoarthritis of knee: Secondary | ICD-10-CM | POA: Diagnosis not present

## 2018-05-18 DIAGNOSIS — M48061 Spinal stenosis, lumbar region without neurogenic claudication: Secondary | ICD-10-CM

## 2018-05-18 DIAGNOSIS — R202 Paresthesia of skin: Secondary | ICD-10-CM

## 2018-05-18 DIAGNOSIS — G8929 Other chronic pain: Secondary | ICD-10-CM

## 2018-05-18 MED ORDER — BACLOFEN 10 MG PO TABS: 10.0000 mg | ORAL_TABLET | Freq: Three times a day (TID) | ORAL | 2 refills | Status: DC

## 2018-05-18 MED ORDER — TRAMADOL HCL 50 MG PO TABS: 50.0000 mg | ORAL_TABLET | Freq: Three times a day (TID) | ORAL | 2 refills | Status: DC | PRN

## 2018-05-18 NOTE — Progress Notes (Signed)
Pain Management Encounter Note - Virtual Visit via Telephone Telehealth (real-time audio visits between healthcare provider and patient).  Patient's Phone No. & Preferred Pharmacy:  [phone] (home); [phone] (mobile); (Preferred) [phone]  [place], [name] - [address] [place] [address] Phone: [phone] Fax: [phone]   Pre-screening note:  Our staff contacted Charlene [name] and offered her an "in person", "face-to-face" appointment versus a telephone encounter. She indicated preferring the telephone encounter, at this time.  Reason for Virtual Visit: COVID-19*  Social distancing based on CDC and AMA recommendations.   I contacted Charlene [name] on 05/18/2018 at 9:45 AM by telephone and clearly identified myself as Dionisio David, NP. I verified that I was speaking with the correct person using two identifiers (Name and date of birth: 1960-06-10).  Advanced Informed Consent I sought verbal advanced consent from Charlene [name] for telemedicine interactions and virtual visit. I informed Charlene [name] of the security and privacy concerns, risks, and limitations associated with performing an evaluation and management service by telephone. I also informed Ms. Shiffman of the availability of "in person" appointments and I informed her of the possibility of a patient responsible charge related to this service. Charlene [name] expressed understanding and agreed to proceed.   Historic Elements   Charlene [name] is a 58 y.o. year old, female patient evaluated today after her last encounter by our practice on 03/24/2018. Charlene [name]  has a past medical history of Arthritis, Degenerative disc disease, lumbar, Diabetes mellitus without complication ([place]), GERD (gastroesophageal reflux disease), Hypertension, Neuropathy, Polyneuropathy, and Polyneuropathy. She also  has a past surgical history that includes arthroscopic rotator cuff; Colonoscopy;  Abdominal hysterectomy; endosopic sinus; and Cataract extraction w/PHACO (Right, 11/25/2017). Charlene [name] has a current medication list which includes the following prescription(s): amlodipine, baclofen, gnp calcium 1200, vitamin d3, duloxetine, gabapentin, insulin glargine (2 unit dial), lidocaine-menthol, liraglutide, lisinopril, magnesium, meloxicam, metoprolol succinate, pantoprazole, polyethylene glycol, and tramadol. She  reports that she has never smoked. She has never used smokeless tobacco. She reports previous alcohol use. She reports that she does not use drugs. Charlene [name] has No Known Allergies.   HPI  I last saw her on 02/24/2018. She is being evaluated for medication management. She is having 5/10 left foot and bilateral knee pain. She is having increased swelling in her left foot and pain. She admits that she had on injection in her knee however her other apt had to be rescheduled. She continues to have heaviness, numbness and weakness in her left foot. She denies any changes. She denies any new concerns. She denies any side effects of her medications.   Pharmacotherapy Assessment  Analgesic:Tramadol 50 mg 1 tablet 3 times daily (last fill date 09/01/2017) tramadol 150 mg/day MME/day: 15mg /day  Monitoring: Pharmacotherapy: No side-effects or adverse reactions reported. [place] PMP: PDMP not reviewed this encounter.       Compliance: No problems identified. Plan: Refer to "POC".  Review of recent tests  DG C-Arm 1-60 Min-No Report Fluoroscopy was utilized by the requesting physician.  No radiographic  interpretation.    Admission on 12/17/2017, Discharged on 12/17/2017  Component Date Value Ref Range Status  . WBC 12/17/2017 10.1  4.0 - 10.5 K/uL Final  . RBC 12/17/2017 4.37  3.87 - 5.11 MIL/uL Final  . Hemoglobin 12/17/2017 12.5  12.0 - 15.0 g/dL Final  . HCT 12/17/2017 37.7  36.0 - 46.0 % Final  . MCV 12/17/2017 86.3  80.0 -  100.0 fL Final  . MCH 12/17/2017 28.6  26.0 - 34.0 pg  Final  . MCHC 12/17/2017 33.2  30.0 - 36.0 g/dL Final  . RDW 12/17/2017 12.4  11.5 - 15.5 % Final  . Platelets 12/17/2017 296  150 - 400 K/uL Final  . nRBC 12/17/2017 0.0  0.0 - 0.2 % Final  . Neutrophils Relative % 12/17/2017 79  % Final  . Neutro Abs 12/17/2017 8.1* 1.7 - 7.7 K/uL Final  . Lymphocytes Relative 12/17/2017 16  % Final  . Lymphs Abs 12/17/2017 1.6  0.7 - 4.0 K/uL Final  . Monocytes Relative 12/17/2017 3  % Final  . Monocytes Absolute 12/17/2017 0.3  0.1 - 1.0 K/uL Final  . Eosinophils Relative 12/17/2017 1  % Final  . Eosinophils Absolute 12/17/2017 0.1  0.0 - 0.5 K/uL Final  . Basophils Relative 12/17/2017 1  % Final  . Basophils Absolute 12/17/2017 0.1  0.0 - 0.1 K/uL Final  . Immature Granulocytes 12/17/2017 0  % Final  . Abs Immature Granulocytes 12/17/2017 0.04  0.00 - 0.07 K/uL Final   Performed at [facility], [address], [place], [place] [address]  . Sodium 12/17/2017 138  135 - 145 mmol/L Final  . Potassium 12/17/2017 4.1  3.5 - 5.1 mmol/L Final  . Chloride 12/17/2017 98  98 - 111 mmol/L Final  . CO2 12/17/2017 28  22 - 32 mmol/L Final  . Glucose, Bld 12/17/2017 347* 70 - 99 mg/dL Final  . BUN 12/17/2017 14  6 - 20 mg/dL Final  . Creatinine, Ser 12/17/2017 0.45  0.44 - 1.00 mg/dL Final  . Calcium 12/17/2017 9.9  8.9 - 10.3 mg/dL Final  . Total Protein 12/17/2017 7.2  6.5 - 8.1 g/dL Final  . Albumin 12/17/2017 4.1  3.5 - 5.0 g/dL Final  . AST 12/17/2017 18  15 - 41 U/L Final  . ALT 12/17/2017 18  0 - 44 U/L Final  . Alkaline Phosphatase 12/17/2017 76  38 - 126 U/L Final  . Total Bilirubin 12/17/2017 0.7  0.3 - 1.2 mg/dL Final  . GFR calc non Af Amer 12/17/2017 >60  >60 mL/min Final  . GFR calc Af Amer 12/17/2017 >60  >60 mL/min Final  . Anion gap 12/17/2017 12  5 - 15 Final   Performed at [facility], [address], [place], [place] [address]  . Color, Urine 12/17/2017 STRAW* YELLOW Final  . APPearance 12/17/2017 CLEAR* CLEAR  Final  . Specific Gravity, Urine 12/17/2017 1.025  1.005 - 1.030 Final  . pH 12/17/2017 8.0  5.0 - 8.0 Final  . Glucose, UA 12/17/2017 >=500* NEGATIVE mg/dL Final  . Hgb urine dipstick 12/17/2017 NEGATIVE  NEGATIVE Final  . Bilirubin Urine 12/17/2017 NEGATIVE  NEGATIVE Final  . Ketones, ur 12/17/2017 20* NEGATIVE mg/dL Final  . Protein, ur 12/17/2017 NEGATIVE  NEGATIVE mg/dL Final  . Nitrite 12/17/2017 NEGATIVE  NEGATIVE Final  . Leukocytes, UA 12/17/2017 NEGATIVE  NEGATIVE Final  . RBC / HPF 12/17/2017 0-5  0 - 5 RBC/hpf Final  . WBC, UA 12/17/2017 0-5  0 - 5 WBC/hpf Final  . Bacteria, UA 12/17/2017 NONE SEEN  NONE SEEN Final  . Squamous Epithelial / LPF 12/17/2017 0-5  0 - 5 Final   Performed at [facility], [address], [place], [place] [address]   Assessment  The primary encounter diagnosis was Osteoarthritis of knee (Bilateral). Diagnoses of DDD (degenerative disc disease), lumbar, Chronic musculoskeletal pain, Chronic pain syndrome, Lumbar  foraminal stenosis (L5-S1) (Right), and Lower extremity numbness and tingling (Left) were also pertinent to this visit.  Plan of Care  I am having Charlene [name] maintain her pantoprazole, metoprolol succinate, DULoxetine, gabapentin, lisinopril, meloxicam, liraglutide, Insulin Glargine (2 Unit Dial), Lidocaine-Menthol (ICY HOT LIDOCAINE PLUS MENTHOL EX), polyethylene glycol, amLODipine, GNP Calcium 1200, Vitamin D3, Magnesium, baclofen, and traMADol.  Pharmacotherapy (Medications Ordered): Meds ordered this encounter  Medications  . baclofen (LIORESAL) 10 MG tablet    Sig: Take 1 tablet (10 mg total) by mouth 3 (three) times daily.    Dispense:  90 tablet    Refill:  2    Do not place medication on "Automatic Refill". Fill one day early if pharmacy is closed on scheduled refill date.    Order Specific Question:   Supervising Provider    Answer:   Milinda Pointer [phone]  . traMADol (ULTRAM) 50 MG tablet    Sig: Take 1  tablet (50 mg total) by mouth every 8 (eight) hours as needed for severe pain.    Dispense:  90 tablet    Refill:  2    Do not place this medication, or any other prescription from our practice, on "Automatic Refill". Patient may have prescription filled one day early if pharmacy is closed on scheduled refill date.    Order Specific Question:   Supervising Provider    Answer:   Milinda Pointer [phone]   Orders:  No orders of the defined types were placed in this encounter.  Follow-up plan:   Return in about 3 months (around 08/17/2018) for MedMgmt.   I discussed the assessment and treatment plan with the patient. The patient was provided an opportunity to ask questions and all were answered. The patient agreed with the plan and demonstrated an understanding of the instructions.  Patient advised to call back or seek an in-person evaluation if the symptoms or condition worsens.  Total duration of non-face-to-face encounter: 13 minutes.  Note by: Dionisio David, NP   Disclaimer:  * Given the special circumstances of the COVID-19 pandemic, the federal government has announced that the Office for Civil Rights (OCR) will exercise its enforcement discretion and will not impose penalties on physicians using telehealth in the event of noncompliance with regulatory requirements under the [place] (HIPAA) in connection with the good faith provision of telehealth during the GEZMO-29 national public health emergency. ([place])

## 2018-05-18 NOTE — Patient Instructions (Signed)
____________________________________________________________________________________________  Medication Rules  Purpose: To inform patients, and their family members, of our rules and regulations.  Applies to: All patients receiving prescriptions (written or electronic).  Pharmacy of record: Pharmacy where electronic prescriptions will be sent. If written prescriptions are taken to a different pharmacy, please inform the nursing staff. The pharmacy listed in the electronic medical record should be the one where you would like electronic prescriptions to be sent.  Electronic prescriptions: In compliance with the  Strengthen Opioid Misuse Prevention (STOP) Act of 2017 (Session Law 2017-74/H243), effective January 20, 2018, all controlled substances must be electronically prescribed. Calling prescriptions to the pharmacy will cease to exist.  Prescription refills: Only during scheduled appointments. Applies to all prescriptions.  NOTE: The following applies primarily to controlled substances (Opioid* Pain Medications).   Patient's responsibilities: 1. Pain Pills: Bring all pain pills to every appointment (except for procedure appointments). 2. Pill Bottles: Bring pills in original pharmacy bottle. Always bring the newest bottle. Bring bottle, even if empty. 3. Medication refills: You are responsible for knowing and keeping track of what medications you take and those you need refilled. The day before your appointment: write a list of all prescriptions that need to be refilled. The day of the appointment: give the list to the admitting nurse. Prescriptions will be written only during appointments. No prescriptions will be written on procedure days. If you forget a medication: it will not be "Called in", "Faxed", or "electronically sent". You will need to get another appointment to get these prescribed. No early refills. Do not call asking to have your prescription filled  early. 4. Prescription Accuracy: You are responsible for carefully inspecting your prescriptions before leaving our office. Have the discharge nurse carefully go over each prescription with you, before taking them home. Make sure that your name is accurately spelled, that your address is correct. Check the name and dose of your medication to make sure it is accurate. Check the number of pills, and the written instructions to make sure they are clear and accurate. Make sure that you are given enough medication to last until your next medication refill appointment. 5. Taking Medication: Take medication as prescribed. When it comes to controlled substances, taking less pills or less frequently than prescribed is permitted and encouraged. Never take more pills than instructed. Never take medication more frequently than prescribed.  6. Inform other Doctors: Always inform, all of your healthcare providers, of all the medications you take. 7. Pain Medication from other Providers: You are not allowed to accept any additional pain medication from any other Doctor or Healthcare provider. There are two exceptions to this rule. (see below) In the event that you require additional pain medication, you are responsible for notifying us, as stated below. 8. Medication Agreement: You are responsible for carefully reading and following our Medication Agreement. This must be signed before receiving any prescriptions from our practice. Safely store a copy of your signed Agreement. Violations to the Agreement will result in no further prescriptions. (Additional copies of our Medication Agreement are available upon request.) 9. Laws, Rules, & Regulations: All patients are expected to follow all Federal and State Laws, Statutes, Rules, & Regulations. Ignorance of the Laws does not constitute a valid excuse. The use of any illegal substances is prohibited. 10. Adopted CDC guidelines & recommendations: Target dosing levels will be  at or below 60 MME/day. Use of benzodiazepines** is not recommended.  Exceptions: There are only two exceptions to the rule of not   receiving pain medications from other Healthcare Providers. 1. Exception #1 (Emergencies): In the event of an emergency (i.e.: accident requiring emergency care), you are allowed to receive additional pain medication. However, you are responsible for: As soon as you are able, call our office [phone], at any time of the day or night, and leave a message stating your name, the date and nature of the emergency, and the name and dose of the medication prescribed. In the event that your call is answered by a member of our staff, make sure to document and save the date, time, and the name of the person that took your information.  2. Exception #2 (Planned Surgery): In the event that you are scheduled by another doctor or dentist to have any type of surgery or procedure, you are allowed (for a period no longer than 30 days), to receive additional pain medication, for the acute post-op pain. However, in this case, you are responsible for picking up a copy of our "Post-op Pain Management for Surgeons" handout, and giving it to your surgeon or dentist. This document is available at our office, and does not require an appointment to obtain it. Simply go to our office during business hours (Monday-Thursday from 8:00 AM to 4:00 PM) (Friday 8:00 AM to 12:00 Noon) or if you have a scheduled appointment with us, prior to your surgery, and ask for it by name. In addition, you will need to provide us with your name, name of your surgeon, type of surgery, and date of procedure or surgery.  *Opioid medications include: morphine, codeine, oxycodone, oxymorphone, hydrocodone, hydromorphone, meperidine, tramadol, tapentadol, buprenorphine, fentanyl, methadone. **Benzodiazepine medications include: diazepam (Valium), alprazolam (Xanax), clonazepam (Klonopine), lorazepam (Ativan), clorazepate  (Tranxene), chlordiazepoxide (Librium), estazolam (Prosom), oxazepam (Serax), temazepam (Restoril), triazolam (Halcion) (Last updated: 03/19/2017) ____________________________________________________________________________________________    

## 2018-06-11 ENCOUNTER — Other Ambulatory Visit: Payer: Self-pay | Admitting: Internal Medicine

## 2018-06-11 DIAGNOSIS — Z1231 Encounter for screening mammogram for malignant neoplasm of breast: Secondary | ICD-10-CM

## 2018-06-11 DIAGNOSIS — R1011 Right upper quadrant pain: Secondary | ICD-10-CM

## 2018-06-29 ENCOUNTER — Ambulatory Visit
Admission: RE | Admit: 2018-06-29 | Discharge: 2018-06-29 | Disposition: A | Discharge status: Home / Self Care | Payer: BLUE CROSS/BLUE SHIELD | Source: Ambulatory Visit | Attending: Internal Medicine | Admitting: Internal Medicine

## 2018-06-29 ENCOUNTER — Other Ambulatory Visit: Payer: Self-pay

## 2018-06-29 DIAGNOSIS — R1011 Right upper quadrant pain: Secondary | ICD-10-CM | POA: Diagnosis present

## 2018-07-06 ENCOUNTER — Other Ambulatory Visit: Payer: Self-pay

## 2018-07-06 ENCOUNTER — Ambulatory Visit: Payer: BLUE CROSS/BLUE SHIELD | Attending: Neurology

## 2018-07-06 DIAGNOSIS — M79605 Pain in left leg: Secondary | ICD-10-CM | POA: Diagnosis present

## 2018-07-06 DIAGNOSIS — M6281 Muscle weakness (generalized): Secondary | ICD-10-CM | POA: Diagnosis present

## 2018-07-06 DIAGNOSIS — R2689 Other abnormalities of gait and mobility: Secondary | ICD-10-CM | POA: Diagnosis present

## 2018-07-06 DIAGNOSIS — M25561 Pain in right knee: Secondary | ICD-10-CM | POA: Diagnosis present

## 2018-07-06 NOTE — Therapy (Signed)
[place] MAIN [facility] SERVICES [address] [place], [name], [address] Phone: [phone]   Fax:  [phone]  Physical Therapy Re-Evaluation  Patient Details  Name: Charlene [name] MRN: [id number] Date of Birth: 04-16-1960 Referring Provider (PT): Dr. Gurney Maxin   Encounter Date: 07/06/2018  PT End of Session - 07/06/18 1300    Visit Number  1    Number of Visits  8    Date for PT Re-Evaluation  08/31/18    Authorization Type  1/10 re-eval starting 6/16    PT Start Time  1030    PT Stop Time  1103    PT Time Calculation (min)  33 min    Equipment Utilized During Treatment  Gait belt    Activity Tolerance  Patient tolerated treatment well;Other (comment)   limited due to BP   Behavior During Therapy  [facility] for tasks assessed/performed       Past Medical History:  Diagnosis Date  . Arthritis    knees  . Degenerative disc disease, lumbar   . Diabetes mellitus without complication ([place])   . GERD (gastroesophageal reflux disease)   . Hypertension   . Neuropathy    left lower leg  . Polyneuropathy   . Polyneuropathy     Past Surgical History:  Procedure Laterality Date  . ABDOMINAL HYSTERECTOMY    . arthroscopic rotator cuff    . CATARACT EXTRACTION W/PHACO Right 11/25/2017   Procedure: CATARACT EXTRACTION PHACO AND INTRAOCULAR LENS PLACEMENT ([place]) RIGHT DIABETIC;  Surgeon: Leandrew Koyanagi, MD;  Location: [facility];  Service: Ophthalmology;  Laterality: Right;  Diabetic - insulin and oral meds  . COLONOSCOPY    . endosopic sinus      There were no vitals filed for this visit.   Subjective Assessment - 07/06/18 1249    Subjective  Patient presents for re-evaluation of neuropathy/weakness and additional PFPS of R knee.    Pertinent History  New order for patellofemoral pain syndrome of right knee in combination with left LE weakness/instability/neuropathy.  R knee pain has begun since she was seen last by this  physical therapy (pre-COVID closure) and neuropathy  Patient reports she is feeling more unsteady "wobbles" more and feels like she is going to fall back. Getting harder to get up steps now per patient report. Patient reports she fell on 06/09/18 onto R side/forward. PMH includes DM type II and HTN. Patient has been seen/evaluated for LE weakness/instability/neuropathy.    Limitations  Lifting;Standing;Walking;House hold activities    How long can you sit comfortably?  2 hours    How long can you stand comfortably?  10 minutes    How long can you walk comfortably?  15    Patient Stated Goals  be able to walk longer distances, improve balance, to be able to shop, negotiate stairs without assistance    Currently in Pain?  Yes    Pain Score  6     Pain Location  Leg    Pain Orientation  Left    Pain Descriptors / Indicators  Burning;Numbness    Pain Type  Neuropathic pain    Pain Radiating Towards  toes    Pain Onset  More than a month ago    Pain Frequency  Constant    Aggravating Factors   prolonged standing/walking    Pain Relieving Factors  heat, rest, stretching    Effect of Pain on Daily Activities  inability to stand/walk for long durations, impaired  balance/gait    Multiple Pain Sites  Yes    Pain Score  7    Pain Location  Knee    Pain Orientation  Right    Pain Descriptors / Indicators  Aching;Grimacing    Pain Type  Acute pain    Pain Radiating Towards  ankle    Pain Onset  More than a month ago    Pain Frequency  Intermittent    Aggravating Factors   weightbearing, stairs, walking    Pain Relieving Factors  rest    Effect of Pain on Daily Activities  limits mobility/stability        [facility] PT Assessment - 07/06/18 0001      Assessment   Medical Diagnosis  Neuropathy/weakness and PFPS    Referring Provider (PT)  Dr. Gurney Maxin    Onset Date/Surgical Date  --   1 year ago    Hand Dominance  Left    Next MD Visit  --   not sure   Prior Therapy  physical therapy at  [facility] in [place], December 2019      Precautions   Precautions  None      Restrictions   Weight Bearing Restrictions  No      Balance Screen   Has the patient fallen in the past 6 months  Yes    How many times?  1   Near falls every other day though   Has the patient had a decrease in activity level because of a fear of falling?   Yes    Is the patient reluctant to leave their home because of a fear of falling?   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, physical labor    Leisure  shopping, music and TV      Cognition   Overall Cognitive Status  Within Functional Limits for tasks assessed      Standardized Balance Assessment   Standardized Balance Assessment  Berg Balance Test      Berg Balance Test   Sit to Stand  Able to stand without using hands and stabilize independently    Standing Unsupported  Able to stand safely 2 minutes    Sitting with Back Unsupported but Feet Supported on Floor or Stool  Able to sit safely and securely 2 minutes    Stand to Sit  Controls descent by using hands    Transfers  Able to transfer safely, definite need of hands    Standing Unsupported with Eyes Closed  Able to stand 10 seconds with supervision    Standing Unsupported with Feet Together  Able to place feet together independently and stand for 1 minute with supervision    From Standing, Reach Forward with Outstretched Arm  Can reach forward >12 cm safely (5")    From Standing Position, Pick up Object from Floor  Able to pick up shoe, needs supervision    From  Standing Position, Turn to Look  Behind Over each Shoulder  Looks behind one side only/other side shows less weight shift    Turn 360 Degrees  Able to turn 360 degrees safely but slowly    Standing Unsupported, Alternately Place Feet on Step/Stool  Able to complete 4 steps without aid or supervision    Standing Unsupported, One Foot in Front  Able to take small step independently and hold 30 seconds    Standing on One Leg  Tries to lift leg/unable to hold 3 seconds but remains standing independently    Total Score  40     192/91 pulse 81    New order for patellofemoral pain syndrome of right knee in combination with left LE weakness/instability/neuropathy.  R knee pain has begun since she was seen last by this physical therapy (pre-COVID closure) and neuropathy  Patient reports she is feeling more unsteady "wobbles" more and feels like she is going to fall back. Getting harder to get up steps now per patient report. Patient reports she fell on 06/09/18 onto R side/forward. PMH includes DM type II and HTN. Patient has been seen/evaluated for LE weakness/instability/neuropathy.     PAIN: Average:  L leg: 6/10  R knee: 7/10   Worst:  L leg: 9/10 R knee: 8/10  POSTURE: Seated: excessive Right lateral weight shift  Standing: limited weight acceptance on LLE with noted spinal/weight shift towards R side.   PROM/AROM: Limited hamstring stretch  STRENGTH:  Graded on a 0-5 scale Muscle Group Left Right  Hip Flex 3/5* 3/5  Hip Abd 3/5 3+/5  Hip Add 2-/5 2-/5  Hip Ext 2-/5 2-/5  Hip IR/ER    Knee Flex 3/5* 3/5*  Knee Ext 3/5* 3/5*  Ankle DF 2-/5* 3/5  Ankle PF 3/5 3/5  *painful    SENSATION: Impaired LLE  SPECIAL TESTS: Limited coordination/task sequencing bilateral LEs  FUNCTIONAL MOBILITY: STS: excessive weight shift onto RLE with noticeable facial grimaces.   BALANCE: Dynamic Sitting Balance  Normal Able to sit unsupported and weight shift across midline maximally   Good Able to sit unsupported and  weight shift across midline moderately   Good-/Fair+ Able to sit unsupported and weight shift across midline minimally   Fair Minimal weight shifting ipsilateral/front, difficulty crossing midline x  Fair- Reach to ipsilateral side and unable to weight shift   Poor + Able to sit unsupported with min A and reach to ipsilateral side, unable to weight shift   Poor Able to sit unsupported with mod A and reach ipsilateral/front-can't cross midline     Standing Dynamic Balance  Normal Stand independently unsupported, able to weight shift and cross midline maximally   Good Stand independently unsupported, able to weight shift and cross midline moderately   Good-/Fair+ Stand independently unsupported, able to weight shift across midline minimally   Fair Stand independently unsupported, weight shift, and reach ipsilaterally, loss of balance when crossing midline   Poor+ Able to stand with Min A and reach ipsilaterally, unable to weight shift x  Poor Able to stand with Mod A and minimally reach ipsilaterally, unable to cross midline.     Static Sitting Balance  Normal Able to maintain balance against maximal resistance   Good Able to maintain balance against moderate resistance   Good-/Fair+ Accepts minimal resistance x  Fair Able to sit unsupported without balance loss and without UE support   Poor+ Able to maintain with Minimal assistance from individual or chair   Poor Unable to  maintain balance-requires mod/max support from individual or chair     Static Standing Balance  Normal Able to maintain standing balance against maximal resistance   Good Able to maintain standing balance against moderate resistance   Good-/Fair+ Able to maintain standing balance against minimal resistance   Fair Able to stand unsupported without UE support and without LOB for 1-2 min x  Fair- Requires Min A and UE support to maintain standing without loss of balance   Poor+ Requires mod A and UE support to maintain  standing without loss of balance   Poor Requires max A and UE support to maintain standing balance without loss      GAIT: Ambulate with use of SPC. Noted external rotation bilaterally with narrow base of support and limited step length. Hyperextension of R knee with fatigue resulting in near LOB.    OUTCOME MEASURES: TEST Outcome Interpretation  10 meter walk test        0 .53        m/s <1.0 m/s indicates increased risk for falls; limited community ambulator      6 minute walk test                Unable to perform due to BP 1000 feet is community Water quality scientist 40/56 <36/56 (100% risk for falls), 37-45 (80% risk for falls); 46-51 (>50% risk for falls); 52-55 (lower risk <25% of falls)        This patient presents with 0, 1- 2, 3, personal factors/ comorbidities xxx, and 1-2,3 , 4  body elements including body structures and functions, activity limitations and or participation restrictions. Patient's condition is stable, evolving, unstable       Patient arrived late limiting evaluation duration. Patient has significant worsening of posture and LE alignment with noted eversion of bilateral LE's. Patient has noted weight shift/body alignment towards the right with limited weight acceptance onto LLE. Patient had not taken her BP medication and was limited in functional evaluation due to high levels. Patient educated on need to take BP medication prior to physical therapy sessions. Patient's gait speed is impaired by instability, weakness, and alignment as seen in decreased velocity. Balance is additionally impaired with patient scoring 40/56. Patient will benefit from skilled physical therapy to increase balance, strength, and decrease pain and fall risks.    Objective measurements completed on examination: See above findings.              PT Education - 07/06/18 1300    Education Details  goals, Re-eval,    Person(s) Educated  Patient    Methods   Explanation    Comprehension  Verbalized understanding       PT Short Term Goals - 07/06/18 1304      PT SHORT TERM GOAL #1   Title  Patient will be independent in home exercise program to improve strength/mobility for better functional independence with ADLs.     Baseline  HEP to be given next session    Time  2    Period  Weeks    Status  New    Target Date  07/20/18      PT SHORT TERM GOAL #2   Title  Patient will report a worst pain of 6/10 on the VAS during ambulation/functional mobility.    Baseline  6/16: LLE 9/10 R knee 8/10    Time  2    Period  Weeks    Status  New  Target Date  07/20/18        PT Long Term Goals - 07/06/18 1306      PT LONG TERM GOAL #1   Title  Patient will report a worst pain of 3/10 on VAS with ambulation/functional mobility for improved quality of life.    Baseline  6/16; R knee: 8/10 LLE 9/10    Time  8    Period  Weeks    Status  New    Target Date  08/31/18      PT LONG TERM GOAL #2   Title  Patient will improve score on 10MWT to >0.74m/s to improve independence with ambulation and to decrease risk of falls.    Baseline  6/16:.53 with SPC    Time  8    Period  Weeks    Status  New    Target Date  08/31/18      PT LONG TERM GOAL #3   Title  Patient will improve score on Berg Balance Test to at least 50/56 to demonstrate improved balance and safety with functional mobility.    Baseline  6/16: 40/56    Time  8    Period  Weeks    Status  New    Target Date  08/31/18      PT LONG TERM GOAL #4   Title  Patient will demonstrate BLE strength of 4-/5 to improve gait mechanics and safety with mobility    Baseline  6/16;limited by pain: grossly 2+/5    Time  8    Period  Weeks    Status  New    Target Date  08/31/18      PT LONG TERM GOAL #5   Title  Patient will negotiate 4 steps with supervision using unilateral UE support (L railing ascending, R railing descending) to promote safety and independence in home environment.     Baseline  6/16; patient reports she requires assistance and significant use of UE's    Time  8    Period  Weeks    Status  New    Target Date  08/31/18             Plan - 07/06/18 1301    Clinical Impression Statement  Patient arrived late limiting evaluation duration. Patient has significant worsening of posture and LE alignment with noted eversion of bilateral LE's. Patient has noted weight shift/body alignment towards the right with limited weight acceptance onto LLE. Patient had not taken her BP medication and was limited in functional evaluation due to high levels. Patient educated on need to take BP medication prior to physical therapy sessions. Patient's gait speed is impaired by instability, weakness, and alignment as seen in decreased velocity. Balance is additionally impaired with patient scoring 40/56. Patient will benefit from skilled physical therapy to increase balance, strength, and decrease pain and fall risks.    Personal Factors and Comorbidities  Comorbidity 1;Comorbidity 3+;Time since onset of injury/illness/exacerbation;Past/Current Experience;Finances    Comorbidities  DM type II, severe diabetic neuropathy, HTN, arthritis, DDD, GERD    Examination-Activity Limitations  Bathing;Bend;Caring for Others;Carry;Dressing;Sleep;Locomotion Level;Lift;Squat;Stairs;Stand;Toileting;Transfers    Examination-Participation Restrictions  Cleaning;Community Activity;Driving;Laundry;Shop;Volunteer;Yard Work    Merchant navy officer  Evolving/Moderate complexity    Clinical Decision Making  Moderate    Clinical Presentation due to:  increased pain/deficit with new onset of RLE PFPS, fluctuation of degree/severity of symptoms (numbness/tingling/swelling), HTN    Rehab Potential  Fair    Clinical Impairments Affecting Rehab Potential  (+)  family support, motivation, age; (-) financial limitations, other medical comorbidities, chronicity    PT Frequency  1x / week    PT  Duration  8 weeks    PT Treatment/Interventions  ADLs/Self Care Home Management;Aquatic Therapy;Biofeedback;Cryotherapy;Electrical Stimulation;Iontophoresis 4mg /ml Dexamethasone;Moist Heat;Traction;Ultrasound;DME Instruction;Gait training;Stair training;Functional mobility training;Therapeutic activities;Therapeutic exercise;Balance training;Neuromuscular re-education;Patient/family education;Cognitive remediation;Orthotic Fit/Training;Manual techniques;Passive range of motion;Compression bandaging;Energy conservation;Splinting;Taping;Visual/perceptual remediation/compensation;Dry needling    PT Next Visit Plan  give HEP    PT Home Exercise Plan  see next session    Recommended Other Services  n/a    Consulted and Agree with Plan of Care  Patient       Patient will benefit from skilled therapeutic intervention in order to improve the following deficits and impairments:  Abnormal gait, Cardiopulmonary status limiting activity, Decreased activity tolerance, Decreased balance, Decreased endurance, Decreased coordination, Decreased knowledge of use of DME, Decreased mobility, Decreased strength, Difficulty walking, Increased muscle spasms, Impaired perceived functional ability, Impaired flexibility, Increased edema, Decreased range of motion, Impaired vision/preception, Improper body mechanics, Postural dysfunction, Pain, Impaired sensation  Visit Diagnosis: 1. Muscle weakness (generalized)   2. Other abnormalities of gait and mobility   3. Pain in left leg   4. Acute pain of right knee        Problem List Patient Active Problem List   Diagnosis Date Noted  . Type 2 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, with long-term current use of insulin ([place]) 03/22/2018  . Abnormal EMG (07/06/2017) 11/23/2017  . Long-term insulin use ([place]) 11/10/2017  . Uncontrolled type 2 diabetes mellitus with hyperglycemia ([place]) 11/10/2017  . Vitamin D  deficiency 10/21/2017  . DM type 2 with diabetic peripheral neuropathy ([place]) 10/21/2017  . DDD (degenerative disc disease), lumbar 10/21/2017  . Lumbar facet arthropathy 10/21/2017  . Lumbar facet syndrome (Bilateral) 10/21/2017  . Lumbar facet hypertrophy 10/21/2017  . Lumbar foraminal stenosis (L5-S1) (Right) 10/21/2017  . Osteoarthritis of knee (Bilateral) 10/21/2017  . Chronic musculoskeletal pain 10/21/2017  . Essential (primary) hypertension 10/12/2017  . Chronic ankle pain (Primary Area of Pain) (Left) 10/12/2017  . Chronic foot pain (Primary Area of Pain) (Left) 10/12/2017  . Chronic lower extremity pain (Secondary Area of Pain) (Bilateral) (L>R) 10/12/2017  . Chronic knee pain (Fourth Area of Pain) (Bilateral) (L>R) 10/12/2017  . Chronic low back pain [facility] Area of Pain) (Bilateral) (L>R) w/ sciatica (Left) 10/12/2017  . Chronic pain syndrome 10/12/2017  . Opiate use 10/12/2017  . Pharmacologic therapy 10/12/2017  . Disorder of skeletal system 10/12/2017  . Problems influencing health status 10/12/2017  . Neuropathy 08/11/2017  . Burning sensation of feet 07/06/2017  . Lower extremity numbness and tingling (Left) 07/06/2017  . Polyneuropathy associated with underlying disease ([place]) 05/29/2017  . DM type 2, uncontrolled, with neuropathy ([place]) 05/05/2017  . Neuropathic pain 06/29/2015  . GERD without esophagitis 01/16/2014   Janna Arch, PT, DPT   07/06/2018, 1:11 PM  [place] MAIN [facility] SERVICES [address] [place], [name], [address] Phone: [phone]   Fax:  [phone]  Name: Charlene [name] MRN: [id number] Date of Birth: [birth year]

## 2018-07-13 ENCOUNTER — Other Ambulatory Visit: Payer: Self-pay

## 2018-07-13 ENCOUNTER — Ambulatory Visit: Payer: BLUE CROSS/BLUE SHIELD

## 2018-07-13 DIAGNOSIS — M6281 Muscle weakness (generalized): Secondary | ICD-10-CM

## 2018-07-13 DIAGNOSIS — R2689 Other abnormalities of gait and mobility: Secondary | ICD-10-CM

## 2018-07-13 DIAGNOSIS — M25561 Pain in right knee: Secondary | ICD-10-CM

## 2018-07-13 DIAGNOSIS — M79605 Pain in left leg: Secondary | ICD-10-CM

## 2018-07-13 NOTE — Therapy (Signed)
[place] MAIN [facility] SERVICES [address] [place], [name], [address] Phone: [phone]   Fax:  [phone]  Physical Therapy Treatment  Patient Details  Name: Charlene [name] MRN: [id number] Date of Birth: 1960-11-08 Referring Provider (PT): Dr. Gurney Maxin   Encounter Date: 07/13/2018  PT End of Session - 07/13/18 1027    Visit Number  2    Number of Visits  8    Date for PT Re-Evaluation  08/31/18    Authorization Type  2/10 re-eval starting 6/16    PT Start Time  1020    PT Stop Time  1100    PT Time Calculation (min)  40 min    Equipment Utilized During Treatment  Gait belt    Activity Tolerance  Patient tolerated treatment well;Other (comment)   limited due to BP   Behavior During Therapy  [facility] for tasks assessed/performed       Past Medical History:  Diagnosis Date  . Arthritis    knees  . Degenerative disc disease, lumbar   . Diabetes mellitus without complication ([place])   . GERD (gastroesophageal reflux disease)   . Hypertension   . Neuropathy    left lower leg  . Polyneuropathy   . Polyneuropathy     Past Surgical History:  Procedure Laterality Date  . ABDOMINAL HYSTERECTOMY    . arthroscopic rotator cuff    . CATARACT EXTRACTION W/PHACO Right 11/25/2017   Procedure: CATARACT EXTRACTION PHACO AND INTRAOCULAR LENS PLACEMENT ([place]) RIGHT DIABETIC;  Surgeon: Leandrew Koyanagi, MD;  Location: [facility];  Service: Ophthalmology;  Laterality: Right;  Diabetic - insulin and oral meds  . COLONOSCOPY    . endosopic sinus      There were no vitals filed for this visit.  Subjective Assessment - 07/13/18 1025    Subjective  Patient presents with pain today, reports no falls or stumbles since last session. Took her blood pressure medication today.    Pertinent History  New order for patellofemoral pain syndrome of right knee in combination with left LE weakness/instability/neuropathy.  R knee pain has begun  since she was seen last by this physical therapy (pre-COVID closure) and neuropathy  Patient reports she is feeling more unsteady "wobbles" more and feels like she is going to fall back. Getting harder to get up steps now per patient report. Patient reports she fell on 06/09/18 onto R side/forward. PMH includes DM type II and HTN. Patient has been seen/evaluated for LE weakness/instability/neuropathy.    Limitations  Lifting;Standing;Walking;House hold activities    How long can you sit comfortably?  2 hours    How long can you stand comfortably?  10 minutes    How long can you walk comfortably?  15    Patient Stated Goals  be able to walk longer distances, improve balance, to be able to shop, negotiate stairs without assistance    Currently in Pain?  Yes    Pain Score  4     Pain Location  Leg    Pain Orientation  Left    Pain Descriptors / Indicators  Aching    Pain Type  Neuropathic pain    Pain Onset  More than a month ago    Pain Frequency  Constant      Treatment:  Nustep Lvl 1 RPM>60 for cardiovascular challenge   TherEx   LAQ 10x inverted foot, everted foot, neutral foot for maximal muscle contraction. : 30 x total, max cueing for  body mechanics/sequencing  Foot ABC's; each LE, patient challenged with prolonged muscle contraction   RTB inversion BLE 10x each LE  Seated adduction squeezes 12x 3 second holds   Standing hip extension with BUE support 12x each LE  Standing hamstring curls 10x each LE, cueing for extending hip prior to flexing knee for maximizing hamstring activation   Neuro Re-ed  airex pad: 2x30 seconds no UE support, trunk/LE sway, Min A to retain COM due to drifting to RLE.   airex pad: marching ,frequent cueing for widening BOS    Access Code: [email or web address]  URL: [email or web address]  Date: 07/13/2018  Prepared by: Janna Arch   Exercises Seated Ankle Alphabet - 10 reps - 2 sets - 5 hold - 1x daily - 7x weekly Proper Sit to Stand  Technique - 10 reps - 2 sets - 5 hold - 1x daily - 7x weekly Seated Sciatic Tensioner - 10 reps - 2 sets - 5 hold - 1x daily - 7x weekly Seated Hamstring Stretch - 2 reps - 2 sets - 30 hold - 1x daily - 7x weekly   Pt educated throughout session about proper posture and technique with exercises. Improved exercise technique, movement at target joints, use of target muscles after min to mod verbal, visual, tactile cues   Patient presents slightly late to physical therapy session. Patient's standing tolerance is limited by R knee pain that is managed by intermixing standing/sitting interventions. Hep created, patient demonstrated understanding, and handout given. Patient has noted external rotation of bilateral feet with R>L. Patient will benefit from skilled physical therapy to increase balance, strength, and decrease pain and fall risks.        PT Education - 07/13/18 1026    Education Details  exercise technique, stability    Person(s) Educated  Patient    Methods  Explanation;Demonstration;Tactile cues;Verbal cues    Comprehension  Verbalized understanding;Returned demonstration;Verbal cues required;Tactile cues required       PT Short Term Goals - 07/06/18 1304      PT SHORT TERM GOAL #1   Title  Patient will be independent in home exercise program to improve strength/mobility for better functional independence with ADLs.     Baseline  HEP to be given next session    Time  2    Period  Weeks    Status  New    Target Date  07/20/18      PT SHORT TERM GOAL #2   Title  Patient will report a worst pain of 6/10 on the VAS during ambulation/functional mobility.    Baseline  6/16: LLE 9/10 R knee 8/10    Time  2    Period  Weeks    Status  New    Target Date  07/20/18        PT Long Term Goals - 07/06/18 1306      PT LONG TERM GOAL #1   Title  Patient will report a worst pain of 3/10 on VAS with ambulation/functional mobility for improved quality of life.    Baseline  6/16;  R knee: 8/10 LLE 9/10    Time  8    Period  Weeks    Status  New    Target Date  08/31/18      PT LONG TERM GOAL #2   Title  Patient will improve score on 10MWT to >0.69m/s to improve independence with ambulation and to decrease risk of falls.    Baseline  6/16:.53 with [facility]  Time  8    Period  Weeks    Status  New    Target Date  08/31/18      PT LONG TERM GOAL #3   Title  Patient will improve score on Berg Balance Test to at least 50/56 to demonstrate improved balance and safety with functional mobility.    Baseline  6/16: 40/56    Time  8    Period  Weeks    Status  New    Target Date  08/31/18      PT LONG TERM GOAL #4   Title  Patient will demonstrate BLE strength of 4-/5 to improve gait mechanics and safety with mobility    Baseline  6/16;limited by pain: grossly 2+/5    Time  8    Period  Weeks    Status  New    Target Date  08/31/18      PT LONG TERM GOAL #5   Title  Patient will negotiate 4 steps with supervision using unilateral UE support (L railing ascending, R railing descending) to promote safety and independence in home environment.    Baseline  6/16; patient reports she requires assistance and significant use of UE's    Time  8    Period  Weeks    Status  New    Target Date  08/31/18            Plan - 07/13/18 1306    Clinical Impression Statement  Patient presents slightly late to physical therapy session. Patient's standing tolerance is limited by R knee pain that is managed by intermixing standing/sitting interventions. Hep created, patient demonstrated understanding, and handout given. Patient has noted external rotation of bilateral feet with R>L. Patient will benefit from skilled physical therapy to increase balance, strength, and decrease pain and fall risks.    Personal Factors and Comorbidities  Comorbidity 1;Comorbidity 3+;Time since onset of injury/illness/exacerbation;Past/Current Experience;Finances    Comorbidities  DM type II, severe  diabetic neuropathy, HTN, arthritis, DDD, GERD    Examination-Activity Limitations  Bathing;Bend;Caring for Others;Carry;Dressing;Sleep;Locomotion Level;Lift;Squat;Stairs;Stand;Toileting;Transfers    Examination-Participation Restrictions  Cleaning;Community Activity;Driving;Laundry;Shop;Volunteer;Yard Work    [employer]  Evolving/Moderate complexity    Rehab Potential  Fair    Clinical Impairments Affecting Rehab Potential  (+) family support, motivation, age; (-) financial limitations, other medical comorbidities, chronicity    PT Frequency  1x / week    PT Duration  8 weeks    PT Treatment/Interventions  ADLs/Self Care Home Management;Aquatic Therapy;Biofeedback;Cryotherapy;Electrical Stimulation;Iontophoresis 4mg /ml Dexamethasone;Moist Heat;Traction;Ultrasound;DME Instruction;Gait training;Stair training;Functional mobility training;Therapeutic activities;Therapeutic exercise;Balance training;Neuromuscular re-education;Patient/family education;Cognitive remediation;Orthotic Fit/Training;Manual techniques;Passive range of motion;Compression bandaging;Energy conservation;Splinting;Taping;Visual/perceptual remediation/compensation;Dry needling    PT Next Visit Plan  give HEP    PT Home Exercise Plan  see next session    Consulted and Agree with Plan of Care  Patient       Patient will benefit from skilled therapeutic intervention in order to improve the following deficits and impairments:  Abnormal gait, Cardiopulmonary status limiting activity, Decreased activity tolerance, Decreased balance, Decreased endurance, Decreased coordination, Decreased knowledge of use of DME, Decreased mobility, Decreased strength, Difficulty walking, Increased muscle spasms, Impaired perceived functional ability, Impaired flexibility, Increased edema, Decreased range of motion, Impaired vision/preception, Improper body mechanics, Postural dysfunction, Pain, Impaired sensation  Visit  Diagnosis: 1. Muscle weakness (generalized)   2. Other abnormalities of gait and mobility   3. Pain in left leg   4. Acute pain of right knee  Problem List Patient Active Problem List   Diagnosis Date Noted  . Type 2 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, with long-term current use of insulin ([place]) 03/22/2018  . Abnormal EMG (07/06/2017) 11/23/2017  . Long-term insulin use ([place]) 11/10/2017  . Uncontrolled type 2 diabetes mellitus with hyperglycemia ([place]) 11/10/2017  . Vitamin D deficiency 10/21/2017  . DM type 2 with diabetic peripheral neuropathy ([place]) 10/21/2017  . DDD (degenerative disc disease), lumbar 10/21/2017  . Lumbar facet arthropathy 10/21/2017  . Lumbar facet syndrome (Bilateral) 10/21/2017  . Lumbar facet hypertrophy 10/21/2017  . Lumbar foraminal stenosis (L5-S1) (Right) 10/21/2017  . Osteoarthritis of knee (Bilateral) 10/21/2017  . Chronic musculoskeletal pain 10/21/2017  . Essential (primary) hypertension 10/12/2017  . Chronic ankle pain (Primary Area of Pain) (Left) 10/12/2017  . Chronic foot pain (Primary Area of Pain) (Left) 10/12/2017  . Chronic lower extremity pain (Secondary Area of Pain) (Bilateral) (L>R) 10/12/2017  . Chronic knee pain (Fourth Area of Pain) (Bilateral) (L>R) 10/12/2017  . Chronic low back pain [facility] Area of Pain) (Bilateral) (L>R) w/ sciatica (Left) 10/12/2017  . Chronic pain syndrome 10/12/2017  . Opiate use 10/12/2017  . Pharmacologic therapy 10/12/2017  . Disorder of skeletal system 10/12/2017  . Problems influencing health status 10/12/2017  . Neuropathy 08/11/2017  . Burning sensation of feet 07/06/2017  . Lower extremity numbness and tingling (Left) 07/06/2017  . Polyneuropathy associated with underlying disease ([place]) 05/29/2017  . DM type 2, uncontrolled, with neuropathy ([place]) 05/05/2017  . Neuropathic pain 06/29/2015  . GERD without esophagitis 01/16/2014    Janna Arch, PT, DPT   07/13/2018, 1:08 PM  [place] MAIN [facility] SERVICES [address] [place], [name], [address] Phone: [phone]   Fax:  [phone]  Name: INARA DIKE MRN: [id number] Date of Birth: 1961/01/16

## 2018-07-19 ENCOUNTER — Other Ambulatory Visit: Payer: Self-pay

## 2018-07-19 ENCOUNTER — Ambulatory Visit: Payer: BLUE CROSS/BLUE SHIELD

## 2018-07-19 DIAGNOSIS — M6281 Muscle weakness (generalized): Secondary | ICD-10-CM

## 2018-07-19 DIAGNOSIS — M25561 Pain in right knee: Secondary | ICD-10-CM

## 2018-07-19 DIAGNOSIS — R2689 Other abnormalities of gait and mobility: Secondary | ICD-10-CM

## 2018-07-19 DIAGNOSIS — M79605 Pain in left leg: Secondary | ICD-10-CM

## 2018-07-19 NOTE — Therapy (Signed)
[place] MAIN [facility] SERVICES [address] [place], [name], [address] Phone: [phone]   Fax:  [phone]  Physical Therapy Treatment  Patient Details  Name: Charlene [name] MRN: [id number] Date of Birth: 28-Oct-1960 Referring Provider (PT): Dr. Gurney Maxin   Encounter Date: 07/19/2018  PT End of Session - 07/19/18 0927    Visit Number  3    Number of Visits  8    Date for PT Re-Evaluation  08/31/18    Authorization Type  3/10 re-eval starting 6/16    PT Start Time  0918    PT Stop Time  0958    PT Time Calculation (min)  40 min    Activity Tolerance  Patient tolerated treatment well    Behavior During Therapy  [facility] for tasks assessed/performed       Past Medical History:  Diagnosis Date  . Arthritis    knees  . Degenerative disc disease, lumbar   . Diabetes mellitus without complication ([place])   . GERD (gastroesophageal reflux disease)   . Hypertension   . Neuropathy    left lower leg  . Polyneuropathy   . Polyneuropathy     Past Surgical History:  Procedure Laterality Date  . ABDOMINAL HYSTERECTOMY    . arthroscopic rotator cuff    . CATARACT EXTRACTION W/PHACO Right 11/25/2017   Procedure: CATARACT EXTRACTION PHACO AND INTRAOCULAR LENS PLACEMENT ([place]) RIGHT DIABETIC;  Surgeon: Leandrew Koyanagi, MD;  Location: [place];  Service: Ophthalmology;  Laterality: Right;  Diabetic - insulin and oral meds  . COLONOSCOPY    . endosopic sinus      There were no vitals filed for this visit.  Subjective Assessment - 07/19/18 0926    Subjective  Pt had a good weekend, didn';t do much. Righ tknee still painful. HEP going well, attention to detail, takes hr time. All meds taken prior to arrival.    Pertinent History  New order for patellofemoral pain syndrome of right knee in combination with left LE weakness/instability/neuropathy.  R knee pain has begun since she was seen last by this physical therapy (pre-COVID  closure) and neuropathy  Patient reports she is feeling more unsteady "wobbles" more and feels like she is going to fall back. Getting harder to get up steps now per patient report. Patient reports she fell on 06/09/18 onto R side/forward. PMH includes DM type II and HTN. Patient has been seen/evaluated for LE weakness/instability/neuropathy.    Currently in Pain?  Yes    Pain Score  5    Rt knee       *BP at beginning of session: 155/90 mmHg  TREATMENT THIS DATE-  Therapeutic Exercise -Rt SLR 2x12 bilat (12" raise at foot)  -Right SAQ over 6' bolster, 3x10 c 5lb ankle weight bilat -hooklying adduction pillow squeezes 2x15x3 second holds  -hooklying glute max bridge (maximal knee flexion to reduce L spine/increase gluteal activation) 2x10  -Seated LAQ 1x15 bilat  -seated RTB ankle inversion in FABER set-up 1x15 bilat -standing (barefoot on towel) gross toe extension 15x3secH  -narrow stance on airex pad 3x30sec, minguardAssist -marching on airex pad 1x20 reciprocal pattern, minGuard assist   PT Short Term Goals - 07/06/18 1304      PT SHORT TERM GOAL #1   Title  Patient will be independent in home exercise program to improve strength/mobility for better functional independence with ADLs.     Baseline  HEP to be given next session    Time  2    Period  Weeks    Status  New    Target Date  07/20/18      PT SHORT TERM GOAL #2   Title  Patient will report a worst pain of 6/10 on the VAS during ambulation/functional mobility.    Baseline  6/16: LLE 9/10 R knee 8/10    Time  2    Period  Weeks    Status  New    Target Date  07/20/18        PT Long Term Goals - 07/06/18 1306      PT LONG TERM GOAL #1   Title  Patient will report a worst pain of 3/10 on VAS with ambulation/functional mobility for improved quality of life.    Baseline  6/16; R knee: 8/10 LLE 9/10    Time  8    Period  Weeks    Status  New    Target Date  08/31/18      PT LONG TERM GOAL #2   Title  Patient  will improve score on 10MWT to >0.32m/s to improve independence with ambulation and to decrease risk of falls.    Baseline  6/16:.53 with [facility]    Time  8    Period  Weeks    Status  New    Target Date  08/31/18      PT LONG TERM GOAL #3   Title  Patient will improve score on Berg Balance Test to at least 50/56 to demonstrate improved balance and safety with functional mobility.    Baseline  6/16: 40/56    Time  8    Period  Weeks    Status  New    Target Date  08/31/18      PT LONG TERM GOAL #4   Title  Patient will demonstrate BLE strength of 4-/5 to improve gait mechanics and safety with mobility    Baseline  6/16;limited by pain: grossly 2+/5    Time  8    Period  Weeks    Status  New    Target Date  08/31/18      PT LONG TERM GOAL #5   Title  Patient will negotiate 4 steps with supervision using unilateral UE support (L railing ascending, R railing descending) to promote safety and independence in home environment.    Baseline  6/16; patient reports she requires assistance and significant use of UE's    Time  8    Period  Weeks    Status  New    Target Date  08/31/18            Plan - 07/19/18 8469    Clinical Impression Statement  In general, patient demonstrating overall good tolerate to therapy session this date, reasonable accommodations are made during session to allow adequate rest between activities as needed, all without any worsening of pain or other symptoms. Pt demonstrates good motivation, focused on best performance this date. Pt is able to progress some strengthening interventions as well as some balance interventions, although strength deficits remain obvious to author, as well as easy fatiguability. Pain is the biggest limitation this date. Pt given intermittent multimodal cues to teach best possible form with exercises. Pt continues to make steady progress toward most goals. No home exercise updates made at this time.    Rehab Potential  Fair    PT  Frequency  1x / week    PT Duration  8 weeks  PT Treatment/Interventions  ADLs/Self Care Home Management;Aquatic Therapy;Biofeedback;Cryotherapy;Electrical Stimulation;Iontophoresis 4mg /ml Dexamethasone;Moist Heat;Traction;Ultrasound;DME Instruction;Gait training;Stair training;Functional mobility training;Therapeutic activities;Therapeutic exercise;Balance training;Neuromuscular re-education;Patient/family education;Cognitive remediation;Orthotic Fit/Training;Manual techniques;Passive range of motion;Compression bandaging;Energy conservation;Splinting;Taping;Visual/perceptual remediation/compensation;Dry needling    PT Next Visit Plan  continue with current program to progress strengthening and balance    PT Home Exercise Plan  no updates this date    Consulted and Agree with Plan of Care  Patient       Patient will benefit from skilled therapeutic intervention in order to improve the following deficits and impairments:  Abnormal gait, Cardiopulmonary status limiting activity, Decreased activity tolerance, Decreased balance, Decreased endurance, Decreased coordination, Decreased knowledge of use of DME, Decreased mobility, Decreased strength, Difficulty walking, Increased muscle spasms, Impaired perceived functional ability, Impaired flexibility, Increased edema, Decreased range of motion, Impaired vision/preception, Improper body mechanics, Postural dysfunction, Pain, Impaired sensation  Visit Diagnosis: 1. Muscle weakness (generalized)   2. Other abnormalities of gait and mobility   3. Pain in left leg   4. Acute pain of right knee        Problem List Patient Active Problem List   Diagnosis Date Noted  . Type 2 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, with long-term current use of insulin ([place]) 03/22/2018  . Abnormal EMG (07/06/2017) 11/23/2017  . Long-term insulin use ([place]) 11/10/2017  . Uncontrolled type 2 diabetes mellitus  with hyperglycemia ([place]) 11/10/2017  . Vitamin D deficiency 10/21/2017  . DM type 2 with diabetic peripheral neuropathy ([place]) 10/21/2017  . DDD (degenerative disc disease), lumbar 10/21/2017  . Lumbar facet arthropathy 10/21/2017  . Lumbar facet syndrome (Bilateral) 10/21/2017  . Lumbar facet hypertrophy 10/21/2017  . Lumbar foraminal stenosis (L5-S1) (Right) 10/21/2017  . Osteoarthritis of knee (Bilateral) 10/21/2017  . Chronic musculoskeletal pain 10/21/2017  . Essential (primary) hypertension 10/12/2017  . Chronic ankle pain (Primary Area of Pain) (Left) 10/12/2017  . Chronic foot pain (Primary Area of Pain) (Left) 10/12/2017  . Chronic lower extremity pain (Secondary Area of Pain) (Bilateral) (L>R) 10/12/2017  . Chronic knee pain (Fourth Area of Pain) (Bilateral) (L>R) 10/12/2017  . Chronic low back pain [facility] Area of Pain) (Bilateral) (L>R) w/ sciatica (Left) 10/12/2017  . Chronic pain syndrome 10/12/2017  . Opiate use 10/12/2017  . Pharmacologic therapy 10/12/2017  . Disorder of skeletal system 10/12/2017  . Problems influencing health status 10/12/2017  . Neuropathy 08/11/2017  . Burning sensation of feet 07/06/2017  . Lower extremity numbness and tingling (Left) 07/06/2017  . Polyneuropathy associated with underlying disease ([place]) 05/29/2017  . DM type 2, uncontrolled, with neuropathy ([place]) 05/05/2017  . Neuropathic pain 06/29/2015  . GERD without esophagitis 01/16/2014   9:58 AM, 07/19/18 Etta Grandchild, PT, DPT Physical Therapist - [facility]  Outpatient Physical Therapy- [place] [phone]     Etta Grandchild 07/19/2018, 9:41 AM  [place] MAIN [facility] SERVICES [address] [place], [name], [address] Phone: [phone]   Fax:  [phone]  Name: Charlene [name] MRN: [id number] Date of Birth: 10-08-1960

## 2018-07-20 ENCOUNTER — Ambulatory Visit: Payer: BLUE CROSS/BLUE SHIELD

## 2018-07-27 ENCOUNTER — Ambulatory Visit: Payer: BLUE CROSS/BLUE SHIELD

## 2018-07-28 ENCOUNTER — Ambulatory Visit: Payer: BLUE CROSS/BLUE SHIELD

## 2018-08-02 ENCOUNTER — Other Ambulatory Visit: Payer: Self-pay

## 2018-08-02 ENCOUNTER — Ambulatory Visit: Payer: BLUE CROSS/BLUE SHIELD | Attending: Neurology

## 2018-08-02 DIAGNOSIS — M79605 Pain in left leg: Secondary | ICD-10-CM | POA: Insufficient documentation

## 2018-08-02 DIAGNOSIS — M25561 Pain in right knee: Secondary | ICD-10-CM | POA: Insufficient documentation

## 2018-08-02 DIAGNOSIS — M6281 Muscle weakness (generalized): Secondary | ICD-10-CM | POA: Insufficient documentation

## 2018-08-02 DIAGNOSIS — R2689 Other abnormalities of gait and mobility: Secondary | ICD-10-CM | POA: Insufficient documentation

## 2018-08-02 NOTE — Therapy (Signed)
[place] MAIN [facility] SERVICES [address] [place], [name], [address] Phone: [phone]   Fax:  [phone]  Physical Therapy Treatment  Patient Details  Name: Charlene [name] MRN: [id number] Date of Birth: [birth year] Referring Provider (PT): Dr. Gurney Maxin   Encounter Date: 08/02/2018  PT End of Session - 08/02/18 1609    Visit Number  4    Number of Visits  8    Date for PT Re-Evaluation  08/31/18    Authorization Type  4/10 re-eval starting 6/16    PT Start Time  1532    PT Stop Time  1600    PT Time Calculation (min)  28 min    Equipment Utilized During Treatment  Gait belt    Activity Tolerance  Patient tolerated treatment well    Behavior During Therapy  [facility] for tasks assessed/performed       Past Medical History:  Diagnosis Date  . Arthritis    knees  . Degenerative disc disease, lumbar   . Diabetes mellitus without complication ([place])   . GERD (gastroesophageal reflux disease)   . Hypertension   . Neuropathy    left lower leg  . Polyneuropathy   . Polyneuropathy     Past Surgical History:  Procedure Laterality Date  . ABDOMINAL HYSTERECTOMY    . arthroscopic rotator cuff    . CATARACT EXTRACTION W/PHACO Right 11/25/2017   Procedure: CATARACT EXTRACTION PHACO AND INTRAOCULAR LENS PLACEMENT ([place]) RIGHT DIABETIC;  Surgeon: Leandrew Koyanagi, MD;  Location: [facility];  Service: Ophthalmology;  Laterality: Right;  Diabetic - insulin and oral meds  . COLONOSCOPY    . endosopic sinus      There were no vitals filed for this visit.  Subjective Assessment - 08/02/18 1607    Subjective  Patient arrived late to session limiting duration of session. Missed last week due to an illness in the family. No falls or LOB since session.    Pertinent History  New order for patellofemoral pain syndrome of right knee in combination with left LE weakness/instability/neuropathy.  R knee pain has begun since she was seen  last by this physical therapy (pre-COVID closure) and neuropathy  Patient reports she is feeling more unsteady "wobbles" more and feels like she is going to fall back. Getting harder to get up steps now per patient report. Patient reports she fell on 06/09/18 onto R side/forward. PMH includes DM type II and HTN. Patient has been seen/evaluated for LE weakness/instability/neuropathy.    Currently in Pain?  Yes    Pain Score  4     Pain Location  Leg    Pain Orientation  Left    Pain Descriptors / Indicators  Aching    Pain Type  Neuropathic pain    Pain Onset  More than a month ago    Pain Frequency  Constant       Neuro Re-ed   airex pad: 2x30 seconds no UE support, trunk/LE sway, Min A to retain COM due to drifting to RLE.    airex pad: marching ,frequent cueing for widening BOS  airex pad: horizontal head turns 30 seconds, vertical head turns 30 seconds  airex pad: eyes closed 30 seconds, CGA  airex pad: 6" step toe taps no UE support 10x each LE  airex pad 6" step lateral toe taps no UE support 15x each LE.   ambulate in hallway with horizontal head turns reading playing cards. LLE excessively ER/toe out. 100  ft with CGA and SPC   Ambulate in hallway reciting grocery list  While focusing on widening BOS and performing neutral foot placement. 100 ft with CGA and SPC   6" step hamstring stretch 60 seconds each LE  Seated IR foot squeeze 10x 3 second holds   2" step toe taps 30 seconds for coordination/sequencing/ spatial awareness. 2 sets     Pt educated throughout session about proper posture and technique with exercises. Improved exercise technique, movement at target joints, use of target muscles after min to mod verbal, visual, tactile cues.                   PT Education - 08/02/18 1609    Education Details  exercise technique, stability    Person(s) Educated  Patient    Methods  Explanation;Demonstration;Tactile cues;Verbal cues    Comprehension   Verbalized understanding;Returned demonstration;Verbal cues required;Tactile cues required       PT Short Term Goals - 07/06/18 1304      PT SHORT TERM GOAL #1   Title  Patient will be independent in home exercise program to improve strength/mobility for better functional independence with ADLs.     Baseline  HEP to be given next session    Time  2    Period  Weeks    Status  New    Target Date  07/20/18      PT SHORT TERM GOAL #2   Title  Patient will report a worst pain of 6/10 on the VAS during ambulation/functional mobility.    Baseline  6/16: LLE 9/10 R knee 8/10    Time  2    Period  Weeks    Status  New    Target Date  07/20/18        PT Long Term Goals - 07/06/18 1306      PT LONG TERM GOAL #1   Title  Patient will report a worst pain of 3/10 on VAS with ambulation/functional mobility for improved quality of life.    Baseline  6/16; R knee: 8/10 LLE 9/10    Time  8    Period  Weeks    Status  New    Target Date  08/31/18      PT LONG TERM GOAL #2   Title  Patient will improve score on 10MWT to >0.46m/s to improve independence with ambulation and to decrease risk of falls.    Baseline  6/16:.53 with SPC    Time  8    Period  Weeks    Status  New    Target Date  08/31/18      PT LONG TERM GOAL #3   Title  Patient will improve score on Berg Balance Test to at least 50/56 to demonstrate improved balance and safety with functional mobility.    Baseline  6/16: 40/56    Time  8    Period  Weeks    Status  New    Target Date  08/31/18      PT LONG TERM GOAL #4   Title  Patient will demonstrate BLE strength of 4-/5 to improve gait mechanics and safety with mobility    Baseline  6/16;limited by pain: grossly 2+/5    Time  8    Period  Weeks    Status  New    Target Date  08/31/18      PT LONG TERM GOAL #5   Title  Patient will negotiate 4 steps  with supervision using unilateral UE support (L railing ascending, R railing descending) to promote safety and  independence in home environment.    Baseline  6/16; patient reports she requires assistance and significant use of UE's    Time  8    Period  Weeks    Status  New    Target Date  08/31/18            Plan - 08/02/18 1612    Clinical Impression Statement  Patient arrived late to PT session limiting session duration of session and number of interventions performed. Patient challenged with dual tasking while maintaining stability. Is limited in single limb stance stability with decreasing UE support. Patient will benefit from skilled physical therapy to increase balance, strength, and decrease pain and fall risks.    Rehab Potential  Fair    PT Frequency  1x / week    PT Duration  8 weeks    PT Treatment/Interventions  ADLs/Self Care Home Management;Aquatic Therapy;Biofeedback;Cryotherapy;Electrical Stimulation;Iontophoresis 4mg /ml Dexamethasone;Moist Heat;Traction;Ultrasound;DME Instruction;Gait training;Stair training;Functional mobility training;Therapeutic activities;Therapeutic exercise;Balance training;Neuromuscular re-education;Patient/family education;Cognitive remediation;Orthotic Fit/Training;Manual techniques;Passive range of motion;Compression bandaging;Energy conservation;Splinting;Taping;Visual/perceptual remediation/compensation;Dry needling    PT Next Visit Plan  continue with current program to progress strengthening and balance    PT Home Exercise Plan  no updates this date    Consulted and Agree with Plan of Care  Patient       Patient will benefit from skilled therapeutic intervention in order to improve the following deficits and impairments:  Abnormal gait, Cardiopulmonary status limiting activity, Decreased activity tolerance, Decreased balance, Decreased endurance, Decreased coordination, Decreased knowledge of use of DME, Decreased mobility, Decreased strength, Difficulty walking, Increased muscle spasms, Impaired perceived functional ability, Impaired flexibility,  Increased edema, Decreased range of motion, Impaired vision/preception, Improper body mechanics, Postural dysfunction, Pain, Impaired sensation  Visit Diagnosis: 1. Muscle weakness (generalized)   2. Other abnormalities of gait and mobility        Problem List Patient Active Problem List   Diagnosis Date Noted  . Type 2 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, with long-term current use of insulin ([place]) 03/22/2018  . Abnormal EMG (07/06/2017) 11/23/2017  . Long-term insulin use ([place]) 11/10/2017  . Uncontrolled type 2 diabetes mellitus with hyperglycemia ([place]) 11/10/2017  . Vitamin D deficiency 10/21/2017  . DM type 2 with diabetic peripheral neuropathy ([place]) 10/21/2017  . DDD (degenerative disc disease), lumbar 10/21/2017  . Lumbar facet arthropathy 10/21/2017  . Lumbar facet syndrome (Bilateral) 10/21/2017  . Lumbar facet hypertrophy 10/21/2017  . Lumbar foraminal stenosis (L5-S1) (Right) 10/21/2017  . Osteoarthritis of knee (Bilateral) 10/21/2017  . Chronic musculoskeletal pain 10/21/2017  . Essential (primary) hypertension 10/12/2017  . Chronic ankle pain (Primary Area of Pain) (Left) 10/12/2017  . Chronic foot pain (Primary Area of Pain) (Left) 10/12/2017  . Chronic lower extremity pain (Secondary Area of Pain) (Bilateral) (L>R) 10/12/2017  . Chronic knee pain (Fourth Area of Pain) (Bilateral) (L>R) 10/12/2017  . Chronic low back pain [facility] Area of Pain) (Bilateral) (L>R) w/ sciatica (Left) 10/12/2017  . Chronic pain syndrome 10/12/2017  . Opiate use 10/12/2017  . Pharmacologic therapy 10/12/2017  . Disorder of skeletal system 10/12/2017  . Problems influencing health status 10/12/2017  . Neuropathy 08/11/2017  . Burning sensation of feet 07/06/2017  . Lower extremity numbness and tingling (Left) 07/06/2017  . Polyneuropathy associated with underlying disease ([place]) 05/29/2017  . DM type 2, uncontrolled, with  neuropathy ([place]) 05/05/2017  .  Neuropathic pain 06/29/2015  . GERD without esophagitis 01/16/2014   Janna Arch, PT, DPT   08/02/2018, 4:17 PM  [place] MAIN [facility] SERVICES [address] [place], [name], [address] Phone: [phone]   Fax:  [phone]  Name: Charlene [name] MRN: [id number] Date of Birth: 03/17/1960

## 2018-08-03 ENCOUNTER — Encounter: Payer: Self-pay | Admitting: Pain Medicine

## 2018-08-03 ENCOUNTER — Ambulatory Visit: Payer: Self-pay

## 2018-08-03 NOTE — Progress Notes (Signed)
Pain Management Virtual Encounter Note - Virtual Visit via Telephone Telehealth (real-time audio visits between healthcare provider and patient).   Patient's Phone No. & Preferred Pharmacy:  [phone] (home); [phone] (mobile); (Preferred) [phone] [email or web address]  [place], [name] - [address] [name] [address] Phone: [phone] Fax: [phone]    Pre-screening note:  Our staff contacted Charlene [name] and offered her an "in person", "face-to-face" appointment versus a telephone encounter. She indicated preferring the telephone encounter, at this time.   Reason for Virtual Visit: COVID-19*  Social distancing based on CDC and AMA recommendations.   I contacted Charlene [name] on 08/04/2018 via telephone.      I clearly identified myself as Gaspar Cola, MD. I verified that I was speaking with the correct person using two identifiers (Name: Charlene [name], and date of birth: 29-Jan-1960).  Advanced Informed Consent I sought verbal advanced consent from Charlene [name] for virtual visit interactions. I informed Charlene [name] of possible security and privacy concerns, risks, and limitations associated with providing "not-in-person" medical evaluation and management services. I also informed Charlene [name] of the availability of "in-person" appointments. Finally, I informed her that there would be a charge for the virtual visit and that she could be  personally, fully or partially, financially responsible for it. [name] expressed understanding and agreed to proceed.   Historic Elements   Charlene [name] is a 58 y.o. year old, female patient evaluated today after her last encounter by our practice on 05/18/2018. [name]  has a past medical history of Arthritis, Degenerative disc disease, lumbar, Diabetes mellitus without complication ([place]), GERD (gastroesophageal reflux disease), Hypertension, Neuropathy,  Polyneuropathy, and Polyneuropathy. She also  has a past surgical history that includes arthroscopic rotator cuff; Colonoscopy; Abdominal hysterectomy; endosopic sinus; and Cataract extraction w/PHACO (Right, 11/25/2017). [name] has a current medication list which includes the following prescription(s): amlodipine, baclofen, gnp calcium 1200, vitamin d3, duloxetine, gabapentin, insulin glargine (2 unit dial), lidocaine-menthol, liraglutide, lisinopril, magnesium, meloxicam, metoprolol succinate, pantoprazole, polyethylene glycol, and tramadol. She  reports that she has never smoked. She has never used smokeless tobacco. She reports previous alcohol use. She reports that she does not use drugs. Ms. Mehlman has No Known Allergies.   HPI  Today, she is being contacted for medication management.  Post-Procedure Evaluation  Procedure (03/23/18): Diagnostic left-sided L5 transforaminal ESI #1 + left-sided L4-5 interlaminar LESI #2 under fluoroscopic guidance and IV sedation. Pre-procedure pain level:  1/10 Post-procedure: 0/10 (100% relief)  Sedation: Sedation provided.  Effectiveness during initial hour after procedure(Ultra-Short Term Relief):   100%  Local anesthetic used: Long-acting (4-6 hours) Effectiveness: Defined as any analgesic benefit obtained secondary to the administration of local anesthetics. This carries significant diagnostic value as to the etiological location, or anatomical origin, of the pain. Duration of benefit is expected to coincide with the duration of the local anesthetic used.  Effectiveness during initial 4-6 hours after procedure(Short-Term Relief):   100%  Long-term benefit: Defined as any relief past the pharmacologic duration of the local anesthetics.  Effectiveness past the initial 6 hours after procedure(Long-Term Relief):   100% x 3 days   Current benefits: Defined as benefit that persist at this time.   Analgesia:  >50% relief x 1-2 weeks Function: Somewhat  improved ROM: Somewhat improved  Pharmacotherapy Assessment  Analgesic: Tramadol 50 mg, 1 tab PO q 8 hrs (150 mg/day of tramadol) MME/day: 15mg /day.   Monitoring: Pharmacotherapy: No  side-effects or adverse reactions reported. [place] PMP: PDMP reviewed during this encounter.       Compliance: No problems identified. Effectiveness: Clinically acceptable. Plan: Refer to "POC".  Pertinent Labs   SAFETY SCREENING Profile No results found for: SARSCOV2NAA, COVIDSOURCE, STAPHAUREUS, MRSAPCR, HCVAB, HIV, PREGTESTUR Renal Function Lab Results  Component Value Date   BUN 14 12/17/2017   CREATININE 0.45 12/17/2017   BCR 17 10/12/2017   GFRAA >60 12/17/2017   GFRNONAA >60 12/17/2017   Hepatic Function Lab Results  Component Value Date   AST 18 12/17/2017   ALT 18 12/17/2017   ALBUMIN 4.1 12/17/2017   UDS Summary  Date Value Ref Range Status  10/12/2017 FINAL  Final    Comment:    ==================================================================== [facility] COMP DRUG ANALYSIS,UR ==================================================================== Test                             Result       Flag       Units Drug Present and Declared for Prescription Verification   Tramadol                       >10638       EXPECTED   ng/mg creat   O-Desmethyltramadol            8266         EXPECTED   ng/mg creat   N-Desmethyltramadol            2066         EXPECTED   ng/mg creat    Source of tramadol is a prescription medication.    O-desmethyltramadol and N-desmethyltramadol are expected    metabolites of tramadol.   Gabapentin                     PRESENT      EXPECTED   Baclofen                       PRESENT      EXPECTED   Metoprolol                     PRESENT      EXPECTED Drug Present not Declared for Prescription Verification   Alcohol, Ethyl                 0.154        UNEXPECTED g/dL    Sources of ethyl alcohol include alcoholic beverages or as a    fermentation product of  glucose; glucose is present in this    specimen.  Interpret result with caution, as the presence of    ethyl alcohol is likely due, at least in part, to fermentation of    glucose.   Naproxen                       PRESENT      UNEXPECTED Drug Absent but Declared for Prescription Verification   Tizanidine                     Not Detected UNEXPECTED    Tizanidine, as indicated in the declared medication list, is not    always detected even when used as directed.   Duloxetine  Not Detected UNEXPECTED   Lidocaine                      Not Detected UNEXPECTED    Lidocaine, as indicated in the declared medication list, is not    always detected even when used as directed. ==================================================================== Test                      Result    Flag   Units      Ref Range   Creatinine              47               mg/dL      >=20 ==================================================================== Declared Medications:  The flagging and interpretation on this report are based on the  following declared medications.  Unexpected results may arise from  inaccuracies in the declared medications.  **Note: The testing scope of this panel includes these medications:  Baclofen  Duloxetine  Gabapentin  Metoprolol  Tramadol  **Note: The testing scope of this panel does not include small to  moderate amounts of these reported medications:  Lidocaine  Tizanidine  **Note: The testing scope of this panel does not include following  reported medications:  Fluticasone  Hydrochlorothiazide (Lisinopril-HCTZ)  Insulin  Lisinopril (Lisinopril-HCTZ)  Metformin  Omeprazole  Pantoprazole  Potassium ==================================================================== For clinical consultation, please call [phone]. ====================================================================    Note: Above Lab results reviewed.  Recent imaging  US  Abdomen Limited RUQ EXAM: ULTRASOUND ABDOMEN LIMITED RIGHT UPPER QUADRANT  COMPARISON:  None.  FINDINGS: Gallbladder:  Small amount of sludge layering within the gallbladder. No visible stones or wall thickening. Negative sonographic Murphy's.  Common bile duct:  Diameter: Normal caliber, 3 mm  Liver:  No focal lesion identified. Within normal limits in parenchymal echogenicity. Portal vein is patent on color Doppler imaging with normal direction of blood flow towards the liver.  IMPRESSION: Small amount of sludge within the gallbladder. No evidence of cholelithiasis or acute cholecystitis.  Electronically Signed   By: Rolm Baptise M.D.   On: 06/29/2018 09:38  Assessment  The primary encounter diagnosis was Chronic pain syndrome. 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  Plan of Care  I have changed Charlene [name]'s gabapentin and meloxicam. I am also having her maintain her pantoprazole, metoprolol succinate, DULoxetine, lisinopril, liraglutide, Insulin Glargine (2 Unit Dial), Lidocaine-Menthol (ICY HOT LIDOCAINE PLUS MENTHOL EX), polyethylene glycol, amLODipine, baclofen, GNP Calcium 1200, Vitamin D3, Magnesium, and traMADol.  Pharmacotherapy (Medications Ordered): Meds ordered this encounter  Medications  . baclofen (LIORESAL) 10 MG tablet     Sig: Take 1 tablet (10 mg total) by mouth 3 (three) times daily.    Dispense:  90 tablet    Refill:  5    Fill one day early if pharmacy is closed on scheduled refill date. May substitute for generic if available.  . Calcium Carbonate-Vit D-Min (GNP CALCIUM 1200) 1200-1000  MG-UNIT CHEW    Sig: Chew 1,200 mg by mouth daily with breakfast. Take in combination with vitamin D and magnesium.    Dispense:  30 tablet    Refill:  5    Fill one day early if pharmacy is closed on scheduled refill date. May substitute for generic if available.  . Cholecalciferol (VITAMIN D3) 125 MCG (5000 UT) CAPS    Sig: Take 1 capsule (5,000 Units total) by mouth daily with breakfast. Take along with calcium and magnesium.    Dispense:  30 capsule    Refill:  5    Fill one day early if pharmacy is closed on scheduled refill date. May substitute for generic if available.  . Magnesium 500 MG CAPS    Sig: Take 1 capsule (500 mg total) by mouth 2 (two) times daily at 8 am and 10 pm.    Dispense:  60 capsule    Refill:  5    Fill one day early if pharmacy is closed on scheduled refill date. May substitute for generic if available.  . gabapentin (NEURONTIN) 800 MG tablet    Sig: Take 1 tablet (800 mg total) by mouth 4 (four) times daily.    Dispense:  120 tablet    Refill:  5    Fill one day early if pharmacy is closed on scheduled refill date. May substitute for generic if available.  . meloxicam (MOBIC) 15 MG tablet    Sig: Take 1 tablet (15 mg total) by mouth daily.    Dispense:  30 tablet    Refill:  5    Fill one day early if pharmacy is closed on scheduled refill date. May substitute for generic if available.  . traMADol (ULTRAM) 50 MG tablet    Sig: Take 1 tablet (50 mg total) by mouth every 8 (eight) hours as needed for severe pain.    Dispense:  90 tablet    Refill:  5    Chronic Pain: STOP Act (Not applicable) Fill 1 day early if closed on refill date. Do not fill until: 08/20/2018. To last until:  02/16/2019. Avoid benzodiazepines within 8 hours of opioids   Orders:  Orders Placed This Encounter  Procedures  . Lumbar Transforaminal Epidural    Standing Status:   Future    Standing Expiration Date:   09/04/2018    Scheduling Instructions:     Side: Left-sided     Level: L5     Sedation: Patient's choice.     Timeframe: ASAP    Order Specific Question:   Where will this procedure be performed?    Answer:   [facility]  . Lumbar Epidural Injection    Standing Status:   Future    Standing Expiration Date:   09/04/2018    Scheduling Instructions:     Procedure: Interlaminar Lumbar Epidural Steroid injection (LESI)  L4-5     Laterality: Left-sided     Sedation: Patient's choice.     Timeframe: ASAA    Order Specific Question:   Where will this procedure be performed?    Answer:   [facility]   Follow-up plan:   Return in about 28 weeks (around 02/16/2019) for (VV), E/M (MM), in addition, Procedure (w/ sedation): (L) L5 TFESI + (L) L4-5 LESI #2, (ASAP).      Interventional management options: Planned, scheduled, and/or pending:   Diagnostic left-sided L5 transforaminal ESI #1 + left-sided L4-5 interlaminar LESI #2 under fluoroscopic guidance and IV sedation.  Considering:   Diagnostic left-sided lumbar sympathetic block Possible left-sided lumbar sympathetic RFA Diagnostic left-sided L4-5 interlaminar LESI Diagnostic right-sided L5 transforaminal ESI Diagnostic bilateral lumbar facet block Possible bilateral lumbar facet RFA Diagnostic bilateral intra-articular knee joint injection with local anesthetic and steroid Possible series of 5 bilateral intra-articular Hyalgan knee injections Diagnostic bilateral genicular nerve block Possible bilateral genicular nerve RFA   Palliative PRN treatment(s):   Palliative bilateral Hyalgan knee series #2 (last done on 02/25/2018)    Recent Visits Date Type Provider Dept  05/18/18 Office Visit Vevelyn Francois, NP [facility]  Showing recent visits within past 90 days and meeting all other requirements   Today's Visits Date Type Provider Dept  08/04/18 Office Visit Milinda Pointer, MD [facility]  Showing today's visits and meeting all other requirements   Future Appointments No visits were found meeting these conditions.  Showing future appointments within next 90 days and meeting all other requirements   I discussed the assessment and treatment plan with the patient. The patient was provided an opportunity to ask questions and all were answered. The patient agreed with the plan and demonstrated an understanding of the instructions.  Patient advised to call back or seek an in-person evaluation if the symptoms or condition worsens.  Total duration of non-face-to-face encounter: 15 minutes.  Note by: Gaspar Cola, MD Date: 08/04/2018; Time: 9:56 AM  Note: This dictation was prepared with Dragon dictation. Any transcriptional errors that may result from this process are unintentional.  Disclaimer:  * Given the special circumstances of the COVID-19 pandemic, the federal government has announced that the Office for Civil Rights (OCR) will exercise its enforcement discretion and will not impose penalties on physicians using telehealth in the event of noncompliance with regulatory requirements under the [place] and [place] (HIPAA) in connection with the good faith provision of telehealth during the GBTDV-76 national public health emergency. ([place])

## 2018-08-04 ENCOUNTER — Telehealth: Payer: Self-pay

## 2018-08-04 ENCOUNTER — Ambulatory Visit: Payer: BLUE CROSS/BLUE SHIELD | Attending: Pain Medicine | Admitting: Pain Medicine

## 2018-08-04 ENCOUNTER — Other Ambulatory Visit: Payer: Self-pay

## 2018-08-04 DIAGNOSIS — M25561 Pain in right knee: Secondary | ICD-10-CM

## 2018-08-04 DIAGNOSIS — M79672 Pain in left foot: Secondary | ICD-10-CM | POA: Diagnosis not present

## 2018-08-04 DIAGNOSIS — G894 Chronic pain syndrome: Secondary | ICD-10-CM | POA: Diagnosis not present

## 2018-08-04 DIAGNOSIS — G63 Polyneuropathy in diseases classified elsewhere: Secondary | ICD-10-CM

## 2018-08-04 DIAGNOSIS — M150 Primary generalized (osteo)arthritis: Secondary | ICD-10-CM

## 2018-08-04 DIAGNOSIS — M79604 Pain in right leg: Secondary | ICD-10-CM

## 2018-08-04 DIAGNOSIS — G629 Polyneuropathy, unspecified: Secondary | ICD-10-CM

## 2018-08-04 DIAGNOSIS — M79605 Pain in left leg: Secondary | ICD-10-CM

## 2018-08-04 DIAGNOSIS — M25562 Pain in left knee: Secondary | ICD-10-CM

## 2018-08-04 DIAGNOSIS — G8929 Other chronic pain: Secondary | ICD-10-CM

## 2018-08-04 DIAGNOSIS — M25572 Pain in left ankle and joints of left foot: Secondary | ICD-10-CM

## 2018-08-04 DIAGNOSIS — E1142 Type 2 diabetes mellitus with diabetic polyneuropathy: Secondary | ICD-10-CM

## 2018-08-04 DIAGNOSIS — M5442 Lumbago with sciatica, left side: Secondary | ICD-10-CM

## 2018-08-04 DIAGNOSIS — M159 Polyosteoarthritis, unspecified: Secondary | ICD-10-CM | POA: Insufficient documentation

## 2018-08-04 DIAGNOSIS — M7918 Myalgia, other site: Secondary | ICD-10-CM

## 2018-08-04 DIAGNOSIS — M5136 Other intervertebral disc degeneration, lumbar region: Secondary | ICD-10-CM

## 2018-08-04 DIAGNOSIS — M48061 Spinal stenosis, lumbar region without neurogenic claudication: Secondary | ICD-10-CM

## 2018-08-04 DIAGNOSIS — M51369 Other intervertebral disc degeneration, lumbar region without mention of lumbar back pain or lower extremity pain: Secondary | ICD-10-CM

## 2018-08-04 DIAGNOSIS — M792 Neuralgia and neuritis, unspecified: Secondary | ICD-10-CM

## 2018-08-04 DIAGNOSIS — E559 Vitamin D deficiency, unspecified: Secondary | ICD-10-CM

## 2018-08-04 MED ORDER — BACLOFEN 10 MG PO TABS: 10.0000 mg | ORAL_TABLET | Freq: Three times a day (TID) | ORAL | 5 refills | Status: DC

## 2018-08-04 MED ORDER — MELOXICAM 15 MG PO TABS: 15.0000 mg | ORAL_TABLET | Freq: Every day | ORAL | 5 refills | Status: DC

## 2018-08-04 MED ORDER — VITAMIN D3 125 MCG (5000 UT) PO CAPS: 1.0000 | ORAL_CAPSULE | Freq: Every day | ORAL | 5 refills | Status: DC

## 2018-08-04 MED ORDER — TRAMADOL HCL 50 MG PO TABS: 50.0000 mg | ORAL_TABLET | Freq: Three times a day (TID) | ORAL | 5 refills | Status: DC | PRN

## 2018-08-04 MED ORDER — MAGNESIUM 500 MG PO CAPS: 500.0000 mg | ORAL_CAPSULE | Freq: Two times a day (BID) | ORAL | 5 refills | Status: DC

## 2018-08-04 MED ORDER — GNP CALCIUM 1200 1200-1000 MG-UNIT PO CHEW: 1200.0000 mg | CHEWABLE_TABLET | Freq: Every day | ORAL | 5 refills | Status: DC

## 2018-08-04 MED ORDER — GABAPENTIN 800 MG PO TABS: 800.0000 mg | ORAL_TABLET | Freq: Four times a day (QID) | ORAL | 5 refills | Status: DC

## 2018-08-04 NOTE — Patient Instructions (Signed)

## 2018-08-12 ENCOUNTER — Encounter: Payer: Self-pay | Admitting: Pain Medicine

## 2018-08-12 ENCOUNTER — Ambulatory Visit (HOSPITAL_BASED_OUTPATIENT_CLINIC_OR_DEPARTMENT_OTHER): Payer: BLUE CROSS/BLUE SHIELD | Admitting: Pain Medicine

## 2018-08-12 ENCOUNTER — Other Ambulatory Visit: Payer: Self-pay

## 2018-08-12 ENCOUNTER — Ambulatory Visit
Admission: RE | Admit: 2018-08-12 | Discharge: 2018-08-12 | Disposition: A | Discharge status: Home / Self Care | Payer: BLUE CROSS/BLUE SHIELD | Source: Ambulatory Visit | Attending: Pain Medicine | Admitting: Pain Medicine

## 2018-08-12 VITALS — BP 198/111 | HR 73 | Temp 97.7°F | Resp 24 | Ht 59.0 in | Wt 125.0 lb

## 2018-08-12 DIAGNOSIS — R200 Anesthesia of skin: Secondary | ICD-10-CM | POA: Diagnosis present

## 2018-08-12 DIAGNOSIS — M5442 Lumbago with sciatica, left side: Secondary | ICD-10-CM | POA: Diagnosis present

## 2018-08-12 DIAGNOSIS — M79605 Pain in left leg: Secondary | ICD-10-CM | POA: Insufficient documentation

## 2018-08-12 DIAGNOSIS — G8929 Other chronic pain: Secondary | ICD-10-CM | POA: Insufficient documentation

## 2018-08-12 DIAGNOSIS — M48061 Spinal stenosis, lumbar region without neurogenic claudication: Secondary | ICD-10-CM | POA: Insufficient documentation

## 2018-08-12 DIAGNOSIS — M79604 Pain in right leg: Secondary | ICD-10-CM

## 2018-08-12 DIAGNOSIS — R202 Paresthesia of skin: Secondary | ICD-10-CM | POA: Diagnosis present

## 2018-08-12 DIAGNOSIS — M5136 Other intervertebral disc degeneration, lumbar region: Secondary | ICD-10-CM

## 2018-08-12 MED ORDER — FENTANYL CITRATE (PF) 100 MCG/2ML IJ SOLN
25.0000 ug | INTRAMUSCULAR | Status: DC | PRN
  Administered 2018-08-12: 14:00:00 50 ug via INTRAVENOUS
  Filled 2018-08-12: qty 2

## 2018-08-12 MED ORDER — IOHEXOL 180 MG/ML  SOLN
10.0000 mL | Freq: Once | INTRAMUSCULAR | Status: AC
  Administered 2018-08-12: 14:00:00 10 mL via EPIDURAL

## 2018-08-12 MED ORDER — LACTATED RINGERS IV SOLN
1000.0000 mL | Freq: Once | INTRAVENOUS | Status: AC
  Administered 2018-08-12: 14:00:00 1000 mL via INTRAVENOUS

## 2018-08-12 MED ORDER — MIDAZOLAM HCL 5 MG/5ML IJ SOLN
1.0000 mg | INTRAMUSCULAR | Status: DC | PRN
  Administered 2018-08-12: 14:00:00 2 mg via INTRAVENOUS
  Filled 2018-08-12: qty 5

## 2018-08-12 MED ORDER — ROPIVACAINE HCL 2 MG/ML IJ SOLN
2.0000 mL | Freq: Once | INTRAMUSCULAR | Status: AC
  Administered 2018-08-12: 2 mL via EPIDURAL
  Filled 2018-08-12: qty 10

## 2018-08-12 MED ORDER — SODIUM CHLORIDE 0.9% FLUSH
2.0000 mL | Freq: Once | INTRAVENOUS | Status: AC
  Administered 2018-08-12: 14:00:00 10 mL

## 2018-08-12 MED ORDER — SODIUM CHLORIDE 0.9% FLUSH
1.0000 mL | Freq: Once | INTRAVENOUS | Status: AC
  Administered 2018-08-12: 14:00:00 10 mL

## 2018-08-12 MED ORDER — DEXAMETHASONE SODIUM PHOSPHATE 10 MG/ML IJ SOLN
10.0000 mg | Freq: Once | INTRAMUSCULAR | Status: AC
  Administered 2018-08-12: 10 mg
  Filled 2018-08-12: qty 1

## 2018-08-12 MED ORDER — TRIAMCINOLONE ACETONIDE 40 MG/ML IJ SUSP
40.0000 mg | Freq: Once | INTRAMUSCULAR | Status: AC
  Administered 2018-08-12: 14:00:00 40 mg
  Filled 2018-08-12: qty 1

## 2018-08-12 MED ORDER — LIDOCAINE HCL 2 % IJ SOLN
20.0000 mL | Freq: Once | INTRAMUSCULAR | Status: AC
  Administered 2018-08-12: 14:00:00 400 mg
  Filled 2018-08-12: qty 40

## 2018-08-12 MED ORDER — ROPIVACAINE HCL 2 MG/ML IJ SOLN
1.0000 mL | Freq: Once | INTRAMUSCULAR | Status: AC
  Administered 2018-08-12: 1 mL via EPIDURAL
  Filled 2018-08-12: qty 10

## 2018-08-12 NOTE — Progress Notes (Addendum)
Patient's Name: Charlene [name]  MRN: [id number]  Referring Provider: Tracie Harrier, MD  DOB: 12/01/1960  PCP: Tracie Harrier, MD  DOS: 08/12/2018  Note by: Gaspar Cola, MD  Service setting: Ambulatory outpatient  Specialty: Interventional Pain Management  Patient type: Established  Location: [facility] (AMB) Pain Management Facility  Visit type: Interventional Procedure   Primary Reason for Visit: Interventional Pain Management Treatment. CC: Back Pain (lower)  Procedure #1:  Anesthesia, Analgesia, Anxiolysis:  Type: Therapeutic Trans-Foraminal Epidural Steroid Injection   #3  Region: Lumbar Level: L5 Paravertebral Laterality: Left-Sided Paravertebral   Type: Moderate (Conscious) Sedation combined with Local Anesthesia Indication(s): Analgesia and Anxiety Route: Intravenous (IV) IV Access: Secured Sedation: Meaningful verbal contact was maintained at all times during the procedure  Local Anesthetic: Lidocaine 1-2%  Position: Prone  Procedure #2:    Type: Therapeutic Inter-Laminar Epidural Steroid Injection   #3  Region: Lumbar Level: L4-5 Level. Laterality: Left Paramedial     Indications: 1. DDD (degenerative disc disease), lumbar   2. 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 Chronic lower extremity pain (Secondary Area of Pain) (Bilateral) (L>R)   4. Lower extremity numbness and tingling (Left)   5. Lumbar foraminal stenosis (L5-S1) (Right)    Pain Score: Pre-procedure: 3 /10 Post-procedure: 0-No pain/10   Pertinent Labs  COVID-19 screennig: No results found for: SARSCOV2NAA  Pre-op Assessment:  Charlene [name] is a 58 y.o. (year old), female patient, seen today for interventional treatment. She  has a past surgical history that includes arthroscopic rotator cuff; Colonoscopy; Abdominal hysterectomy; endosopic sinus; and Cataract extraction w/PHACO (Right, 11/25/2017). Charlene [name] has a current medication list which  includes the following prescription(s): amlodipine, baclofen, gnp calcium 1200, vitamin d3, dicyclomine, duloxetine, gabapentin, insulin glargine (2 unit dial), lidocaine-menthol, liraglutide, lisinopril, magnesium, meloxicam, metoprolol succinate, pantoprazole, polyethylene glycol, and tramadol, and the following Facility-Administered Medications: fentanyl and midazolam. Her primarily concern today is the Back Pain (lower)  Initial Vital Signs:  Pulse/HCG Rate: 73ECG Heart Rate: 99 Temp: 98.6 F (37 C) Resp: 16 BP: (!) 189/95 SpO2: 100 %  BMI: Estimated body mass index is 25.25 kg/m as calculated from the following:   Height as of this encounter: 4\' 11"  (1.499 m).   Weight as of this encounter: 125 lb (56.7 kg).  Risk Assessment: Allergies: Reviewed. She has No Known Allergies.  Allergy Precautions: None required Coagulopathies: Reviewed. None identified.  Blood-thinner therapy: None at this time Active Infection(s): Reviewed. None identified. Charlene [name] is afebrile  Site Confirmation: Charlene [name] was asked to confirm the procedure and laterality before marking the site Procedure checklist: Completed Consent: Before the procedure and under the influence of no sedative(s), amnesic(s), or anxiolytics, the patient was informed of the treatment options, risks and possible complications. To fulfill our ethical and legal obligations, as recommended by the American Medical Association's Code of Ethics, I have informed the patient of my clinical impression; the nature and purpose of the treatment or procedure; the risks, benefits, and possible complications of the intervention; the alternatives, including doing nothing; the risk(s) and benefit(s) of the alternative treatment(s) or procedure(s); and the risk(s) and benefit(s) of doing nothing. The patient was provided information about the general risks and possible complications associated with the procedure. These may include, but are not limited  to: failure to achieve desired goals, infection, bleeding, organ or nerve damage, allergic reactions, paralysis, and death. In addition, the patient was informed of those risks and  complications associated to Spine-related procedures, such as failure to decrease pain; infection (i.e.: Meningitis, epidural or intraspinal abscess); bleeding (i.e.: epidural hematoma, subarachnoid hemorrhage, or any other type of intraspinal or peri-dural bleeding); organ or nerve damage (i.e.: Any type of peripheral nerve, nerve root, or spinal cord injury) with subsequent damage to sensory, motor, and/or autonomic systems, resulting in permanent pain, numbness, and/or weakness of one or several areas of the body; allergic reactions; (i.e.: anaphylactic reaction); and/or death. Furthermore, the patient was informed of those risks and complications associated with the medications. These include, but are not limited to: allergic reactions (i.e.: anaphylactic or anaphylactoid reaction(s)); adrenal axis suppression; blood sugar elevation that in diabetics may result in ketoacidosis or comma; water retention that in patients with history of congestive heart failure may result in shortness of breath, pulmonary edema, and decompensation with resultant heart failure; weight gain; swelling or edema; medication-induced neural toxicity; particulate matter embolism and blood vessel occlusion with resultant organ, and/or nervous system infarction; and/or aseptic necrosis of one or more joints. Finally, the patient was informed that Medicine is not an exact science; therefore, there is also the possibility of unforeseen or unpredictable risks and/or possible complications that may result in a catastrophic outcome. The patient indicated having understood very clearly. We have given the patient no guarantees and we have made no promises. Enough time was given to the patient to ask questions, all of which were answered to the patient's satisfaction.  Charlene [name] has indicated that she wanted to continue with the procedure. Attestation: I, the ordering provider, attest that I have discussed with the patient the benefits, risks, side-effects, alternatives, likelihood of achieving goals, and potential problems during recovery for the procedure that I have provided informed consent. Date  Time: 08/12/2018  1:07 PM  Pre-Procedure Preparation:  Monitoring: As per clinic protocol. Respiration, ETCO2, SpO2, BP, heart rate and rhythm monitor placed and checked for adequate function Safety Precautions: Patient was assessed for positional comfort and pressure points before starting the procedure. Time-out: I initiated and conducted the "Time-out" before starting the procedure, as per protocol. The patient was asked to participate by confirming the accuracy of the "Time Out" information. Verification of the correct person, site, and procedure were performed and confirmed by me, the nursing staff, and the patient. "Time-out" conducted as per Joint Commission's Universal Protocol (UP.01.01.01). Time: 1343  Description of Procedure #1:  Target Area: The inferior and lateral portion of the pedicle, just lateral to a line created by the 6:00 position of the pedicle and the superior articular process of the vertebral body below. On the lateral view, this target lies just posterior to the anterior aspect of the lamina and posterior to the midpoint created between the anterior and the posterior aspect of the neural foramina. Approach: Posterior paravertebral approach. Area Prepped: Entire Posterior Lumbosacral Region Prepping solution: DuraPrep (Iodine Povacrylex [0.7% available iodine] and Isopropyl Alcohol, 74% w/w) Safety Precautions: Aspiration looking for blood return was conducted prior to all injections. At no point did we inject any substances, as a needle was being advanced. No attempts were made at seeking any paresthesias. Safe injection practices and  needle disposal techniques used. Medications properly checked for expiration dates. SDV (single dose vial) medications used.  Description of the Procedure: Protocol guidelines were followed. The patient was placed in position over the fluoroscopy table. The target area was identified and the area prepped in the usual manner. Skin & deeper tissues infiltrated with local anesthetic. Appropriate amount of time  allowed to pass for local anesthetics to take effect. The procedure needles were then advanced to the target area. Proper needle placement secured. Negative aspiration confirmed. Solution injected in intermittent fashion, asking for systemic symptoms every 0.2cc of injectate. The needles were then removed and the area cleansed, making sure to leave some of the prepping solution back to take advantage of its long term bactericidal properties.  Start Time: 1343 hrs.  Materials:  Needle(s) Type: Spinal Needle Gauge: 22G Length: 3.5-in Medication(s): Please see orders for medications and dosing details.  Description of Procedure #2:  Target Area: The  interlaminar space, initially targeting the lower border of the superior vertebral body lamina. Approach: Posterior paramedial approach. Area Prepped: Same as above Prepping solution: Same as above Safety Precautions: Same as above  Description of the Procedure: Protocol guidelines were followed. The patient was placed in position over the fluoroscopy table. The target area was identified and the area prepped in the usual manner. Skin & deeper tissues infiltrated with local anesthetic. Appropriate amount of time allowed to pass for local anesthetics to take effect. The procedure needle was introduced through the skin, ipsilateral to the reported pain, and advanced to the target area. Bone was contacted and the needle walked caudad, until the lamina was cleared. The ligamentum flavum was engaged and loss-of-resistance technique used as the epidural  needle was advanced. The epidural space was identified using "loss-of-resistance technique" with 2-3 ml of PF-NaCl (0.9% NSS), in a 5cc LOR glass syringe. Proper needle placement secured. Negative aspiration confirmed. Solution injected in intermittent fashion, asking for systemic symptoms every 0.5cc of injectate. The needles were then removed and the area cleansed, making sure to leave some of the prepping solution back to take advantage of its long term bactericidal properties.  Vitals:   08/12/18 1409 08/12/18 1419 08/12/18 1429 08/12/18 1437  BP: (!) 176/91 (!) 173/90 (!) 187/106 (!) 198/111  Pulse:      Resp: 10 10 15  (!) 24  Temp: 98.3 F (36.8 C)   97.7 F (36.5 C)  TempSrc: Temporal   Temporal  SpO2: 98% 98% 99% 100%  Weight:      Height:        End Time: 1358 hrs.  Materials:  Needle(s) Type: Epidural needle Gauge: 17G Length: 3.5-in Medication(s): Please see orders for medications and dosing details.  Imaging Guidance (Spinal):          Type of Imaging Technique: Fluoroscopy Guidance (Spinal) Indication(s): Assistance in needle guidance and placement for procedures requiring needle placement in or near specific anatomical locations not easily accessible without such assistance. Exposure Time: Please see nurses notes. Contrast: Before injecting any contrast, we confirmed that the patient did not have an allergy to iodine, shellfish, or radiological contrast. Once satisfactory needle placement was completed at the desired level, radiological contrast was injected. Contrast injected under live fluoroscopy. No contrast complications. See chart for type and volume of contrast used. Fluoroscopic Guidance: I was personally present during the use of fluoroscopy. "Tunnel Vision Technique" used to obtain the best possible view of the target area. Parallax error corrected before commencing the procedure. "Direction-depth-direction" technique used to introduce the needle under continuous  pulsed fluoroscopy. Once target was reached, antero-posterior, oblique, and lateral fluoroscopic projection used confirm needle placement in all planes. Images permanently stored in EMR. Interpretation: I personally interpreted the imaging intraoperatively. Adequate needle placement confirmed in multiple planes. Appropriate spread of contrast into desired area was observed. No evidence of afferent or efferent intravascular  uptake. No intrathecal or subarachnoid spread observed. Permanent images saved into the patient's record.  Antibiotic Prophylaxis:   Anti-infectives (From admission, onward)   None     Indication(s): None identified  Post-operative Assessment:  Post-procedure Vital Signs:  Pulse/HCG Rate: 7387 Temp: 97.7 F (36.5 C) Resp: (!) 24 BP: (!) 198/111(md notified) SpO2: 100 %  EBL: None  Complications: No immediate post-treatment complications observed by team, or reported by patient.  Note: The patient tolerated the entire procedure well. A repeat set of vitals were taken after the procedure and the patient was kept under observation following institutional policy, for this type of procedure. Post-procedural neurological assessment was performed, showing return to baseline, prior to discharge. The patient was provided with post-procedure discharge instructions, including a section on how to identify potential problems. Should any problems arise concerning this procedure, the patient was given instructions to immediately contact us, at any time, without hesitation. In any case, we plan to contact the patient by telephone for a follow-up status report regarding this interventional procedure.  Comments:  No additional relevant information.  Plan of Care  Orders:  Orders Placed This Encounter  Procedures  . Lumbar Epidural Injection    Scheduling Instructions:     Procedure: Interlaminar LESI L4-5     Laterality: Left-sided     Sedation: Patient's choice     Timeframe:   Today    Order Specific Question:   Where will this procedure be performed?    Answer:   [facility]  . Lumbar Transforaminal Epidural    Scheduling Instructions:     Side: Left-sided     Level: L5     Sedation: With Sedation.     Timeframe: Today    Order Specific Question:   Where will this procedure be performed?    Answer:   [facility]  . DG PAIN [facility] C-ARM 1-60 MIN NO REPORT    Intraoperative interpretation by procedural physician at [place].    Standing Status:   Standing    Number of Occurrences:   1    Order Specific Question:   Reason for exam:    Answer:   Assistance in needle guidance and placement for procedures requiring needle placement in or near specific anatomical locations not easily accessible without such assistance.  . Provider attestation of informed consent for procedure/surgical case    I, the ordering provider, attest that I have discussed with the patient the benefits, risks, side effects, alternatives, likelihood of achieving goals and potential problems during recovery for the procedure that I have provided informed consent.    Standing Status:   Standing    Number of Occurrences:   1  . Informed Consent Details: Transcribe to consent form and obtain patient signature    Standing Status:   Standing    Number of Occurrences:   1    Order Specific Question:   Procedure    Answer:   Lumbar epidural steroid injection under fluoroscopic guidance. (See notes for level and laterality.)    Order Specific Question:   Surgeon    Answer:   Armaan Pond A. Dossie Arbour, MD    Order Specific Question:   Indication/Reason    Answer:   Low back pain and/or leg pain secondary to lumbar radiculitis/radiculopathy  . Informed Consent Details: Transcribe to consent form and obtain patient signature    Standing Status:   Standing    Number of Occurrences:   1    Order  Specific Question:   Procedure    Answer:   Lumbar transforaminal epidural  steroid injection under fluoroscopic guidance. (See notes for level and laterality.)    Order Specific Question:   Surgeon    Answer:   Reg Bircher A. Dossie Arbour, MD    Order Specific Question:   Indication/Reason    Answer:   Lumbar radiculopathy/radiculitis   Chronic Opioid Analgesic:  Tramadol 50 mg, 1 tab PO q 8 hrs (150 mg/day of tramadol)(enough to last until 02/16/2019) MME/day: 15mg /day.   Medications ordered for procedure: Meds ordered this encounter  Medications  . iohexol (OMNIPAQUE) 180 MG/ML injection 10 mL    Must be Myelogram-compatible. If not available, you may substitute with a water-soluble, non-ionic, hypoallergenic, myelogram-compatible radiological contrast medium.  Marland Kitchen lidocaine (XYLOCAINE) 2 % (with pres) injection 400 mg  . lactated ringers infusion 1,000 mL  . midazolam (VERSED) 5 MG/5ML injection 1-2 mg    Make sure Flumazenil is available in the pyxis when using this medication. If oversedation occurs, administer 0.2 mg IV over 15 sec. If after 45 sec no response, administer 0.2 mg again over 1 min; may repeat at 1 min intervals; not to exceed 4 doses (1 mg)  . fentaNYL (SUBLIMAZE) injection 25-50 mcg    Make sure Narcan is available in the pyxis when using this medication. In the event of respiratory depression (RR< 8/min): Titrate NARCAN (naloxone) in increments of 0.1 to 0.2 mg IV at 2-3 minute intervals, until desired degree of reversal.  . sodium chloride flush (NS) 0.9 % injection 2 mL  . ropivacaine (PF) 2 mg/mL (0.2%) (NAROPIN) injection 2 mL  . triamcinolone acetonide (KENALOG-40) injection 40 mg  . sodium chloride flush (NS) 0.9 % injection 1 mL  . ropivacaine (PF) 2 mg/mL (0.2%) (NAROPIN) injection 1 mL  . dexamethasone (DECADRON) injection 10 mg   Medications administered: We administered iohexol, lidocaine, lactated ringers, midazolam, fentaNYL, sodium chloride flush, ropivacaine (PF) 2 mg/mL (0.2%), triamcinolone acetonide, sodium chloride flush,  ropivacaine (PF) 2 mg/mL (0.2%), and dexamethasone.  See the medical record for exact dosing, route, and time of administration.  Follow-up plan:   Return for (VV), 2 wk PP-F/U Eval.       Interventional management options:  Considering:   Diagnostic left LSB Possible left-sided lumbar sympathetic RFA Diagnostic right L5 TFESI Diagnostic bilateral lumbar facet block Possible bilateral lumbar facet RFA Diagnostic bilateral intra-articular knee joint injection (w/ L.A. + steroids)  Diagnostic bilateral genicular nerve block Possible bilateral genicular nerve RFA   Palliative PRN treatment(s):   Diagnostic left-sided L5 transforaminal ESI #2 + left-sided L4-5 interlaminar LESI #3  Diagnostic left L5 transforaminal ESI #2 Diagnostic left L4-5 interlaminar LESI #3  Palliative bilateral Hyalgan knee series S2N1 (last done on 02/25/2018)     Recent Visits Date Type Provider Dept  08/04/18 Office Visit Milinda Pointer, MD [facility]  05/18/18 Office Visit Vevelyn Francois, NP [facility]  Showing recent visits within past 90 days and meeting all other requirements   Today's Visits Date Type Provider Dept  08/12/18 Procedure visit Milinda Pointer, MD [facility]  Showing today's visits and meeting all other requirements   Future Appointments Date Type Provider Dept  09/20/18 Appointment Milinda Pointer, MD [facility]  Showing future appointments within next 90 days and meeting all other requirements   Disposition: Discharge home  Discharge Date & Time: 08/12/2018; 1443 hrs.   Primary Care Physician: Tracie Harrier, MD Location: [facility]  Facility Note by: Gaspar Cola, MD Date: 08/12/2018; Time: 4:02 PM  Disclaimer:  Medicine is not an Chief Strategy Officer. The only guarantee in medicine is that nothing is guaranteed. It is important to note that the decision to proceed with this intervention was  based on the information collected from the patient. The Data and conclusions were drawn from the patient's questionnaire, the interview, and the physical examination. Because the information was provided in large part by the patient, it cannot be guaranteed that it has not been purposely or unconsciously manipulated. Every effort has been made to obtain as much relevant data as possible for this evaluation. It is important to note that the conclusions that lead to this procedure are derived in large part from the available data. Always take into account that the treatment will also be dependent on availability of resources and existing treatment guidelines, considered by other Pain Management Practitioners as being common knowledge and practice, at the time of the intervention. For Medico-Legal purposes, it is also important to point out that variation in procedural techniques and pharmacological choices are the acceptable norm. The indications, contraindications, technique, and results of the above procedure should only be interpreted and judged by a Board-Certified Interventional Pain Specialist with extensive familiarity and expertise in the same exact procedure and technique.

## 2018-08-12 NOTE — Progress Notes (Signed)
Safety precautions to be maintained throughout the outpatient stay will include: orient to surroundings, keep bed in low position, maintain call bell within reach at all times, provide assistance with transfer out of bed and ambulation.  

## 2018-08-12 NOTE — Patient Instructions (Signed)

## 2018-08-13 ENCOUNTER — Telehealth: Payer: Self-pay | Admitting: *Deleted

## 2018-08-13 NOTE — Telephone Encounter (Signed)
Voicemail left with patient re; procedure on yesteray, instructed to call office if there are questions or concerns.

## 2018-08-16 ENCOUNTER — Encounter: Payer: Self-pay | Admitting: Physical Therapy

## 2018-08-16 ENCOUNTER — Other Ambulatory Visit: Payer: Self-pay

## 2018-08-16 ENCOUNTER — Ambulatory Visit: Payer: BLUE CROSS/BLUE SHIELD | Admitting: Physical Therapy

## 2018-08-16 DIAGNOSIS — M79605 Pain in left leg: Secondary | ICD-10-CM

## 2018-08-16 DIAGNOSIS — M6281 Muscle weakness (generalized): Secondary | ICD-10-CM | POA: Diagnosis not present

## 2018-08-16 DIAGNOSIS — M25561 Pain in right knee: Secondary | ICD-10-CM

## 2018-08-16 DIAGNOSIS — R2689 Other abnormalities of gait and mobility: Secondary | ICD-10-CM

## 2018-08-16 NOTE — Therapy (Signed)
[place] MAIN [facility] SERVICES [address] [place], [name], [address] Phone: [phone]   Fax:  [phone]  Physical Therapy Treatment  Patient Details  Name: Charlene [name] MRN: [id number] Date of Birth: May 29, 1960 Referring Provider (PT): Dr. Gurney Maxin   Encounter Date: 08/16/2018  PT End of Session - 08/16/18 0832    Visit Number  5    Number of Visits  8    Date for PT Re-Evaluation  08/31/18    Authorization Type  4/10 re-eval starting 6/16    PT Start Time  0845    PT Stop Time  0925    PT Time Calculation (min)  40 min    Equipment Utilized During Treatment  Gait belt    Activity Tolerance  Patient tolerated treatment well    Behavior During Therapy  [facility] for tasks assessed/performed       Past Medical History:  Diagnosis Date  . Arthritis    knees  . Degenerative disc disease, lumbar   . Diabetes mellitus without complication ([place])   . GERD (gastroesophageal reflux disease)   . Hypertension   . Neuropathy    left lower leg  . Polyneuropathy   . Polyneuropathy     Past Surgical History:  Procedure Laterality Date  . ABDOMINAL HYSTERECTOMY    . arthroscopic rotator cuff    . CATARACT EXTRACTION W/PHACO Right 11/25/2017   Procedure: CATARACT EXTRACTION PHACO AND INTRAOCULAR LENS PLACEMENT ([place]) RIGHT DIABETIC;  Surgeon: Leandrew Koyanagi, MD;  Location: [place];  Service: Ophthalmology;  Laterality: Right;  Diabetic - insulin and oral meds  . COLONOSCOPY    . endosopic sinus      There were no vitals filed for this visit.  Subjective Assessment - 08/16/18 0848    Subjective  Patient had a shot in her back on Thursday and now she is feeling much better, no pain today.    Pertinent History  New order for patellofemoral pain syndrome of right knee in combination with left LE weakness/instability/neuropathy.  R knee pain has begun since she was seen last by this physical therapy (pre-COVID closure)  and neuropathy  Patient reports she is feeling more unsteady "wobbles" more and feels like she is going to fall back. Getting harder to get up steps now per patient report. Patient reports she fell on 06/09/18 onto R side/forward. PMH includes DM type II and HTN. Patient has been seen/evaluated for LE weakness/instability/neuropathy.    Limitations  Lifting;Standing;Walking;House hold activities    How long can you sit comfortably?  2 hours    How long can you stand comfortably?  10 minutes    How long can you walk comfortably?  15    Patient Stated Goals  be able to walk longer distances, improve balance, to be able to shop, negotiate stairs without assistance    Currently in Pain?  No/denies    Pain Score  0-No pain    Pain Onset  More than a month ago    Pain Onset  More than a month ago              Neuromuscular Re-education  Airex balance with toe taps to 6" step alternating LE x 10 each, faded UE support, pt able to perform with 2 finger support at the end of the set  Static balance on 1/2 bolster (flat side up) with no UE support 30s x 2  Standing on 1/2 bolster (Flat side up) heel/toe  rock x15 with no UE hold for balance but intermittent assist from therapist to prevent falls especially with heel rocking and posterior   Standing on airex: toe taps to 4 inch step with 2-1 rail assist x15 reps bilaterally  standing one foot on step, Alternate BUE ball pass side/side x5 reps each foot on step  mini squat unsupported x10 reps  heel/toe raises x15 reps with B rail assist for balance  Alternate march x15 bilaterally with cues to increase hip flexion for better ROM/strength  Leg press 70 lbs x 20 x 2   staggered stance, head turns side/side, up/down x5 reps each, each foot in front                PT Education - 08/16/18 0832    Education Details  HEP    Person(s) Educated  Patient    Methods  Explanation    Comprehension  Verbalized understanding;Returned  demonstration;Need further instruction       PT Short Term Goals - 07/06/18 1304      PT SHORT TERM GOAL #1   Title  Patient will be independent in home exercise program to improve strength/mobility for better functional independence with ADLs.     Baseline  HEP to be given next session    Time  2    Period  Weeks    Status  New    Target Date  07/20/18      PT SHORT TERM GOAL #2   Title  Patient will report a worst pain of 6/10 on the VAS during ambulation/functional mobility.    Baseline  6/16: LLE 9/10 R knee 8/10    Time  2    Period  Weeks    Status  New    Target Date  07/20/18        PT Long Term Goals - 07/06/18 1306      PT LONG TERM GOAL #1   Title  Patient will report a worst pain of 3/10 on VAS with ambulation/functional mobility for improved quality of life.    Baseline  6/16; R knee: 8/10 LLE 9/10    Time  8    Period  Weeks    Status  New    Target Date  08/31/18      PT LONG TERM GOAL #2   Title  Patient will improve score on 10MWT to >0.62m/s to improve independence with ambulation and to decrease risk of falls.    Baseline  6/16:.53 with SPC    Time  8    Period  Weeks    Status  New    Target Date  08/31/18      PT LONG TERM GOAL #3   Title  Patient will improve score on Berg Balance Test to at least 50/56 to demonstrate improved balance and safety with functional mobility.    Baseline  6/16: 40/56    Time  8    Period  Weeks    Status  New    Target Date  08/31/18      PT LONG TERM GOAL #4   Title  Patient will demonstrate BLE strength of 4-/5 to improve gait mechanics and safety with mobility    Baseline  6/16;limited by pain: grossly 2+/5    Time  8    Period  Weeks    Status  New    Target Date  08/31/18      PT LONG TERM GOAL #5   Title  Patient will negotiate 4 steps with supervision using unilateral UE support (L railing ascending, R railing descending) to promote safety and independence in home environment.    Baseline  6/16;  patient reports she requires assistance and significant use of UE's    Time  8    Period  Weeks    Status  New    Target Date  08/31/18            Plan - 08/16/18 1275    Clinical Impression Statement  Focused on advancement of  exercises and performing more exercises in standing today to improve LE strength and improving endurance. Patient demonstrates increased postural sway and LOB posteriorly requiring UEs to maintain balance and VCs to shift weight forward. Patient will benefit from further skilled therapy focused on improving strength and balance to return to prior level of function    Rehab Potential  Fair    PT Frequency  1x / week    PT Duration  8 weeks    PT Treatment/Interventions  ADLs/Self Care Home Management;Aquatic Therapy;Biofeedback;Cryotherapy;Electrical Stimulation;Iontophoresis 4mg /ml Dexamethasone;Moist Heat;Traction;Ultrasound;DME Instruction;Gait training;Stair training;Functional mobility training;Therapeutic activities;Therapeutic exercise;Balance training;Neuromuscular re-education;Patient/family education;Cognitive remediation;Orthotic Fit/Training;Manual techniques;Passive range of motion;Compression bandaging;Energy conservation;Splinting;Taping;Visual/perceptual remediation/compensation;Dry needling    PT Next Visit Plan  continue with current program to progress strengthening and balance    PT Home Exercise Plan  no updates this date    Consulted and Agree with Plan of Care  Patient       Patient will benefit from skilled therapeutic intervention in order to improve the following deficits and impairments:  Abnormal gait, Cardiopulmonary status limiting activity, Decreased activity tolerance, Decreased balance, Decreased endurance, Decreased coordination, Decreased knowledge of use of DME, Decreased mobility, Decreased strength, Difficulty walking, Increased muscle spasms, Impaired perceived functional ability, Impaired flexibility, Increased edema, Decreased  range of motion, Impaired vision/preception, Improper body mechanics, Postural dysfunction, Pain, Impaired sensation  Visit Diagnosis: 1. Muscle weakness (generalized)   2. Other abnormalities of gait and mobility   3. Pain in left leg   4. Acute pain of right knee        Problem List Patient Active Problem List   Diagnosis Date Noted  . Osteoarthritis involving multiple joints 08/04/2018  . Type 2 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, with long-term current use of insulin ([place]) 03/22/2018  . Abnormal EMG (07/06/2017) 11/23/2017  . Long-term insulin use ([place]) 11/10/2017  . Uncontrolled type 2 diabetes mellitus with hyperglycemia ([place]) 11/10/2017  . Vitamin D deficiency 10/21/2017  . DM type 2 with diabetic peripheral neuropathy ([place]) 10/21/2017  . DDD (degenerative disc disease), lumbar 10/21/2017  . Lumbar facet arthropathy 10/21/2017  . Lumbar facet syndrome (Bilateral) 10/21/2017  . Lumbar facet hypertrophy 10/21/2017  . Lumbar foraminal stenosis (L5-S1) (Right) 10/21/2017  . Osteoarthritis of knee (Bilateral) 10/21/2017  . Chronic musculoskeletal pain 10/21/2017  . Essential (primary) hypertension 10/12/2017  . Chronic ankle pain (Primary Area of Pain) (Left) 10/12/2017  . Chronic foot pain (Primary Area of Pain) (Left) 10/12/2017  . Chronic lower extremity pain (Secondary Area of Pain) (Bilateral) (L>R) 10/12/2017  . Chronic knee pain (Fourth Area of Pain) (Bilateral) (L>R) 10/12/2017  . Chronic low back pain [facility] Area of Pain) (Bilateral) (L>R) w/ sciatica (Left) 10/12/2017  . Chronic pain syndrome 10/12/2017  . Opiate use 10/12/2017  . Pharmacologic therapy 10/12/2017  . Disorder of skeletal system 10/12/2017  . Problems influencing health status 10/12/2017  . Neuropathy 08/11/2017  . Burning  sensation of feet 07/06/2017  . Lower extremity numbness and tingling (Left) 07/06/2017  . Polyneuropathy  associated with underlying disease ([place]) 05/29/2017  . DM type 2, uncontrolled, with neuropathy ([place]) 05/05/2017  . Neuropathic pain 06/29/2015  . GERD without esophagitis 01/16/2014    Alanson Puls, [place] DPT 08/16/2018, 9:22 AM  [place] MAIN [facility] SERVICES [address] [place], [name], [address] Phone: [phone]   Fax:  [phone]  Name: Charlene [name] MRN: [id number] Date of Birth: 05-17-1960

## 2018-08-27 ENCOUNTER — Ambulatory Visit: Payer: BLUE CROSS/BLUE SHIELD | Attending: Neurology

## 2018-08-27 ENCOUNTER — Other Ambulatory Visit: Payer: Self-pay

## 2018-08-27 DIAGNOSIS — M6281 Muscle weakness (generalized): Secondary | ICD-10-CM | POA: Diagnosis present

## 2018-08-27 DIAGNOSIS — M25561 Pain in right knee: Secondary | ICD-10-CM | POA: Diagnosis present

## 2018-08-27 DIAGNOSIS — R2689 Other abnormalities of gait and mobility: Secondary | ICD-10-CM

## 2018-08-27 DIAGNOSIS — M79605 Pain in left leg: Secondary | ICD-10-CM | POA: Insufficient documentation

## 2018-08-27 NOTE — Therapy (Signed)
[place] MAIN [facility] SERVICES [address] [place], [name], [address] Phone: [phone]   Fax:  [phone]  Physical Therapy Treatment  Patient Details  Name: Charlene [name] MRN: [id number] Date of Birth: [birth year] Referring Provider (PT): Dr. Gurney Maxin   Encounter Date: 08/27/2018  PT End of Session - 08/27/18 1203    Visit Number  6    Number of Visits  8    Date for PT Re-Evaluation  08/31/18    Authorization Type  4/10 re-eval starting 6/16    PT Start Time  1105    PT Stop Time  1143    PT Time Calculation (min)  38 min    Equipment Utilized During Treatment  Gait belt    Activity Tolerance  Patient tolerated treatment well    Behavior During Therapy  [facility] for tasks assessed/performed       Past Medical History:  Diagnosis Date  . Arthritis    knees  . Degenerative disc disease, lumbar   . Diabetes mellitus without complication ([place])   . GERD (gastroesophageal reflux disease)   . Hypertension   . Neuropathy    left lower leg  . Polyneuropathy   . Polyneuropathy     Past Surgical History:  Procedure Laterality Date  . ABDOMINAL HYSTERECTOMY    . arthroscopic rotator cuff    . CATARACT EXTRACTION W/PHACO Right 11/25/2017   Procedure: CATARACT EXTRACTION PHACO AND INTRAOCULAR LENS PLACEMENT ([place]) RIGHT DIABETIC;  Surgeon: Leandrew Koyanagi, MD;  Location: [place];  Service: Ophthalmology;  Laterality: Right;  Diabetic - insulin and oral meds  . COLONOSCOPY    . endosopic sinus      There were no vitals filed for this visit.  Subjective Assessment - 08/27/18 1201    Subjective  Pt denies any pain upon arrival.  She arrived 15 minutes late for appointment today.    Pertinent History  New order for patellofemoral pain syndrome of right knee in combination with left LE weakness/instability/neuropathy.  R knee pain has begun since she was seen last by this physical therapy (pre-COVID closure) and  neuropathy  Patient reports she is feeling more unsteady "wobbles" more and feels like she is going to fall back. Getting harder to get up steps now per patient report. Patient reports she fell on 06/09/18 onto R side/forward. PMH includes DM type II and HTN. Patient has been seen/evaluated for LE weakness/instability/neuropathy.    Limitations  Lifting;Standing;Walking;House hold activities    How long can you sit comfortably?  2 hours    How long can you stand comfortably?  10 minutes    How long can you walk comfortably?  15    Patient Stated Goals  be able to walk longer distances, improve balance, to be able to shop, negotiate stairs without assistance    Currently in Pain?  No/denies    Pain Score  0-No pain           PT Treatment:  Therapeutic Exercise:  Leg Press 70# 2x10; PF on Leg Press Machine at 70# 1x10; Ambulation on TM at 1.0 mph and no incline with B UE support and verbal cueing for improved heel strike and BOS (2.5 min).  Neuromuscular Re-Education: Karaoke in carpeted hallway with CGAx1 and gait belt and verbal cueing 2 trials down hallway; Fwd and Bkwd ambulation in carpeted hallway 2 trials down hallway; narrow BOS on AirEx foam pad with EO/EC and no UE support; Tandem stance on  blue foam half roller without UE support; weightshifting fwd and bkwd on blue foam half roller without UE support; Marching on AirEx foam pad without UE support.                    PT Education - 08/27/18 1202    Education Details  reviewed HEP    Person(s) Educated  Patient    Methods  Explanation    Comprehension  Verbalized understanding;Returned demonstration       PT Short Term Goals - 07/06/18 1304      PT SHORT TERM GOAL #1   Title  Patient will be independent in home exercise program to improve strength/mobility for better functional independence with ADLs.     Baseline  HEP to be given next session    Time  2    Period  Weeks    Status  New    Target Date   07/20/18      PT SHORT TERM GOAL #2   Title  Patient will report a worst pain of 6/10 on the VAS during ambulation/functional mobility.    Baseline  6/16: LLE 9/10 R knee 8/10    Time  2    Period  Weeks    Status  New    Target Date  07/20/18        PT Long Term Goals - 07/06/18 1306      PT LONG TERM GOAL #1   Title  Patient will report a worst pain of 3/10 on VAS with ambulation/functional mobility for improved quality of life.    Baseline  6/16; R knee: 8/10 LLE 9/10    Time  8    Period  Weeks    Status  New    Target Date  08/31/18      PT LONG TERM GOAL #2   Title  Patient will improve score on 10MWT to >0.67m/s to improve independence with ambulation and to decrease risk of falls.    Baseline  6/16:.53 with SPC    Time  8    Period  Weeks    Status  New    Target Date  08/31/18      PT LONG TERM GOAL #3   Title  Patient will improve score on Berg Balance Test to at least 50/56 to demonstrate improved balance and safety with functional mobility.    Baseline  6/16: 40/56    Time  8    Period  Weeks    Status  New    Target Date  08/31/18      PT LONG TERM GOAL #4   Title  Patient will demonstrate BLE strength of 4-/5 to improve gait mechanics and safety with mobility    Baseline  6/16;limited by pain: grossly 2+/5    Time  8    Period  Weeks    Status  New    Target Date  08/31/18      PT LONG TERM GOAL #5   Title  Patient will negotiate 4 steps with supervision using unilateral UE support (L railing ascending, R railing descending) to promote safety and independence in home environment.    Baseline  6/16; patient reports she requires assistance and significant use of UE's    Time  8    Period  Weeks    Status  New    Target Date  08/31/18            Plan - 08/27/18 1203  Clinical Impression Statement  Worked on improving BOS and heel strike with gait on TM and in hallway today.  Pt able to improve with verbal cueing and practice.  She did have  fatigue after 2.5 min on TM at 1.0 mph and had difficulty maintaining improved heel strike with muscle fatigue.    Personal Factors and Comorbidities  Comorbidity 1;Comorbidity 3+;Time since onset of injury/illness/exacerbation;Past/Current Experience;Finances    Comorbidities  DM type II, severe diabetic neuropathy, HTN, arthritis, DDD, GERD    Examination-Activity Limitations  Bathing;Bend;Caring for Others;Carry;Dressing;Sleep;Locomotion Level;Lift;Squat;Stairs;Stand;Toileting;Transfers    Examination-Participation Restrictions  Cleaning;Community Activity;Driving;Laundry;Shop;Volunteer;Yard Work    [employer]  Evolving/Moderate complexity    Clinical Decision Making  Moderate    Rehab Potential  Fair    Clinical Impairments Affecting Rehab Potential  (+) family support, motivation, age; (-) financial limitations, other medical comorbidities, chronicity    PT Frequency  1x / week    PT Duration  8 weeks    PT Treatment/Interventions  ADLs/Self Care Home Management;Aquatic Therapy;Biofeedback;Cryotherapy;Electrical Stimulation;Iontophoresis 4mg /ml Dexamethasone;Moist Heat;Traction;Ultrasound;DME Instruction;Gait training;Stair training;Functional mobility training;Therapeutic activities;Therapeutic exercise;Balance training;Neuromuscular re-education;Patient/family education;Cognitive remediation;Orthotic Fit/Training;Manual techniques;Passive range of motion;Compression bandaging;Energy conservation;Splinting;Taping;Visual/perceptual remediation/compensation;Dry needling    PT Next Visit Plan  continue with current program to progress strengthening and balance    Consulted and Agree with Plan of Care  Patient       Patient will benefit from skilled therapeutic intervention in order to improve the following deficits and impairments:  Abnormal gait, Cardiopulmonary status limiting activity, Decreased activity tolerance, Decreased balance, Decreased endurance, Decreased  coordination, Decreased knowledge of use of DME, Decreased mobility, Decreased strength, Difficulty walking, Increased muscle spasms, Impaired perceived functional ability, Impaired flexibility, Increased edema, Decreased range of motion, Impaired vision/preception, Improper body mechanics, Postural dysfunction, Pain, Impaired sensation  Visit Diagnosis: 1. Muscle weakness (generalized)   2. Other abnormalities of gait and mobility   3. Pain in left leg        Problem List Patient Active Problem List   Diagnosis Date Noted  . Osteoarthritis involving multiple joints 08/04/2018  . Type 2 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, with long-term current use of insulin ([place]) 03/22/2018  . Abnormal EMG (07/06/2017) 11/23/2017  . Long-term insulin use ([place]) 11/10/2017  . Uncontrolled type 2 diabetes mellitus with hyperglycemia ([place]) 11/10/2017  . Vitamin D deficiency 10/21/2017  . DM type 2 with diabetic peripheral neuropathy ([place]) 10/21/2017  . DDD (degenerative disc disease), lumbar 10/21/2017  . Lumbar facet arthropathy 10/21/2017  . Lumbar facet syndrome (Bilateral) 10/21/2017  . Lumbar facet hypertrophy 10/21/2017  . Lumbar foraminal stenosis (L5-S1) (Right) 10/21/2017  . Osteoarthritis of knee (Bilateral) 10/21/2017  . Chronic musculoskeletal pain 10/21/2017  . Essential (primary) hypertension 10/12/2017  . Chronic ankle pain (Primary Area of Pain) (Left) 10/12/2017  . Chronic foot pain (Primary Area of Pain) (Left) 10/12/2017  . Chronic lower extremity pain (Secondary Area of Pain) (Bilateral) (L>R) 10/12/2017  . Chronic knee pain (Fourth Area of Pain) (Bilateral) (L>R) 10/12/2017  . Chronic low back pain [facility] Area of Pain) (Bilateral) (L>R) w/ sciatica (Left) 10/12/2017  . Chronic pain syndrome 10/12/2017  . Opiate use 10/12/2017  . Pharmacologic therapy 10/12/2017  . Disorder of skeletal system 10/12/2017  .  Problems influencing health status 10/12/2017  . Neuropathy 08/11/2017  . Burning sensation of feet 07/06/2017  . Lower extremity numbness and tingling (Left) 07/06/2017  . Polyneuropathy associated with underlying disease ([place]) 05/29/2017  . DM type  2, uncontrolled, with neuropathy ([place]) 05/05/2017  . Neuropathic pain 06/29/2015  . GERD without esophagitis 01/16/2014    Cresencia Asmus, MPT 08/27/2018, 12:07 PM  [place] MAIN [facility] SERVICES [address] [place], [name], [address] Phone: [phone]   Fax:  [phone]  Name: Charlene [name] MRN: [id number] Date of Birth: 22-May-1960

## 2018-09-03 ENCOUNTER — Other Ambulatory Visit: Payer: Self-pay

## 2018-09-03 ENCOUNTER — Ambulatory Visit: Payer: BLUE CROSS/BLUE SHIELD

## 2018-09-03 DIAGNOSIS — M79605 Pain in left leg: Secondary | ICD-10-CM

## 2018-09-03 DIAGNOSIS — M25561 Pain in right knee: Secondary | ICD-10-CM

## 2018-09-03 DIAGNOSIS — R2689 Other abnormalities of gait and mobility: Secondary | ICD-10-CM

## 2018-09-03 DIAGNOSIS — M6281 Muscle weakness (generalized): Secondary | ICD-10-CM

## 2018-09-03 NOTE — Therapy (Signed)
[place] [facility] SERVICES [address] [place], [name], [address] Phone: [phone]   Fax:  [phone]  Physical Therapy Treatment/ RECERT  Patient Details  Name: Charlene [name] MRN: [id number] Date of Birth: 1960-07-10 Referring Provider (PT): Dr. Gurney Maxin   Encounter Date: 09/03/2018  PT End of Session - 09/03/18 1005    Visit Number  7    Number of Visits  15    Date for PT Re-Evaluation  10/29/18    Authorization Type  7/10 re-eval starting 6/16    PT Start Time  0930    PT Stop Time  1012    PT Time Calculation (min)  42 min    Equipment Utilized During Treatment  Gait belt    Activity Tolerance  Patient tolerated treatment well    Behavior During Therapy  [facility] for tasks assessed/performed       Past Medical History:  Diagnosis Date  . Arthritis    knees  . Degenerative disc disease, lumbar   . Diabetes mellitus without complication ([place])   . GERD (gastroesophageal reflux disease)   . Hypertension   . Neuropathy    left lower leg  . Polyneuropathy   . Polyneuropathy     Past Surgical History:  Procedure Laterality Date  . ABDOMINAL HYSTERECTOMY    . arthroscopic rotator cuff    . CATARACT EXTRACTION W/PHACO Right 11/25/2017   Procedure: CATARACT EXTRACTION PHACO AND INTRAOCULAR LENS PLACEMENT ([place]) RIGHT DIABETIC;  Surgeon: Leandrew Koyanagi, MD;  Location: [place];  Service: Ophthalmology;  Laterality: Right;  Diabetic - insulin and oral meds  . COLONOSCOPY    . endosopic sinus      There were no vitals filed for this visit.  Subjective Assessment - 09/03/18 1004    Subjective  Patient reports compliance with HEP, reports she is excited with her progress and wants to continue physical therapy to keep getting better and being more independent.    Pertinent History  New order for patellofemoral pain syndrome of right knee in combination with left LE weakness/instability/neuropathy.  R knee  pain has begun since she was seen last by this physical therapy (pre-COVID closure) and neuropathy  Patient reports she is feeling more unsteady "wobbles" more and feels like she is going to fall back. Getting harder to get up steps now per patient report. Patient reports she fell on 06/09/18 onto R side/forward. PMH includes DM type II and HTN. Patient has been seen/evaluated for LE weakness/instability/neuropathy.    Limitations  Lifting;Standing;Walking;House hold activities    How long can you sit comfortably?  2 hours    How long can you stand comfortably?  10 minutes    How long can you walk comfortably?  15    Patient Stated Goals  be able to walk longer distances, improve balance, to be able to shop, negotiate stairs without assistance    Currently in Pain?  No/denies       Goals:   VAS during ambulation in LLE and R knee: 4/10  10 MWT: 13 seconds no AD= 0.23ms BERG: 45/ 56  BLE strength:    Right Left  Hip flexion 4-/5 3+/5  Hip Abduction 3+/5 3/5  Hip Adduction 3+/5 3/5  Knee Extension  3+/5 * 3/5  Knee Flexion 4-/5 3/5  DF 4-/5 2-/5  PF 4-/5 3/5   * increase in pain   Stairs: SUE support ascend, BUE support descend, cueing for widening BOS ; performed  4x with improved gait mechanics.   LEFS: 30/80  TherEx 4lb ankle weights:   LAQ hold 5 second. 12x  Sit to stand grab a ball outside BOS and toss into hoop x 20   [facility] PT Assessment - 09/03/18 0001      Standardized Balance Assessment   Standardized Balance Assessment  Berg Balance Test      Berg Balance Test   Sit to Stand  Able to stand without using hands and stabilize independently    Standing Unsupported  Able to stand safely 2 minutes    Sitting with Back Unsupported but Feet Supported on Floor or Stool  Able to sit safely and securely 2 minutes    Stand to Sit  Controls descent by using hands    Transfers  Able to transfer safely, minor use of hands    Standing Unsupported with Eyes Closed  Able to stand  10 seconds with supervision    Standing Unsupported with Feet Together  Able to place feet together independently and stand for 1 minute with supervision    From Standing, Reach Forward with Outstretched Arm  Can reach forward >12 cm safely (5")    From Standing Position, Pick up Object from Floor  Able to pick up shoe, needs supervision    From Standing Position, Turn to Look Behind Over each Shoulder  Looks behind from both sides and weight shifts well    Turn 360 Degrees  Able to turn 360 degrees safely one side only in 4 seconds or less    Standing Unsupported, Alternately Place Feet on Step/Stool  Able to stand independently and complete 8 steps >20 seconds    Standing Unsupported, One Foot in Front  Able to take small step independently and hold 30 seconds    Standing on One Leg  Able to lift leg independently and hold equal to or more than 3 seconds    Total Score  45       Patient reports she thinks physical therapy is helping and wants to continue.  Patient demonstrates good progress/meeting short/long term goals. Her gait speed is improving with a decreased reliance upon assistive devices as well as her balance. A continued focus on LE strengthening and single limb stability will progress her mobility and quality of life at this time. Pain is less of an impact on patient mobility however R knee continues to have limitations with stair negotiation. Patient's condition has the potential to improve in response to therapy. Maximum improvement is yet to be obtained. The anticipated improvement is attainable and reasonable in a generally predictable time.Patient will benefit from skilled physical therapy to increase stability and mobility to improve quality of life and decrease falls risk.                   PT Education - 09/03/18 1005    Education Details  goals, POC    Person(s) Educated  Patient    Methods  Explanation;Demonstration;Tactile cues;Verbal cues     Comprehension  Verbalized understanding;Returned demonstration;Verbal cues required;Tactile cues required       PT Short Term Goals - 09/03/18 1131      PT SHORT TERM GOAL #1   Title  Patient will be independent in home exercise program to improve strength/mobility for better functional independence with ADLs.     Baseline  8/14: HEP compliant    Time  2    Period  Weeks    Status  Partially Met  Target Date  09/17/18      PT SHORT TERM GOAL #2   Title  Patient will report a worst pain of 6/10 on the VAS during ambulation/functional mobility.    Baseline  6/16: LLE 9/10 R knee 8/10 8/14: 4/10    Time  2    Period  Weeks    Status  Achieved    Target Date  07/20/18        PT Long Term Goals - 09/03/18 1132      PT LONG TERM GOAL #1   Title  Patient will report a worst pain of 3/10 on VAS with ambulation/functional mobility for improved quality of life.    Baseline  6/16; R knee: 8/10 LLE 9/10 8/14: 4/10    Time  8    Period  Weeks    Status  Partially Met    Target Date  10/29/18      PT LONG TERM GOAL #2   Title  Patient will improve score on 10MWT to >0.61ms to improve independence with ambulation and to decrease risk of falls.    [facility]  [facility] .77 m/s w/o AD    Time  8    Period  Weeks    Status  Achieved      PT LONG TERM GOAL #3   Title  Patient will improve score on Berg Balance Test to at least 50/56 to demonstrate improved balance and safety with functional mobility.    Baseline  6/16: 40/56 8/14: 45/56    Time  8    Period  Weeks    Status  Partially Met    Target Date  10/29/18      PT LONG TERM GOAL #4   Title  Patient will demonstrate BLE strength of 4-/5 to improve gait mechanics and safety with mobility    Baseline  6/16;limited by pain: grossly 2+/5 8/14: L 3/5 R 4-/5    Time  8    Period  Weeks    Status  Partially Met    Target Date  10/29/18      PT LONG TERM GOAL #5   Title  Patient will negotiate 4 steps with  supervision using unilateral UE support (L railing ascending, R railing descending) to promote safety and independence in home environment.    Baseline  6/16; patient reports she requires assistance and significant use of UE's 814: unilateral support ascending, bilateral support descending    Time  8    Period  Weeks    Status  Partially Met    Target Date  10/29/18      Additional Long Term Goals   Additional Long Term Goals  Yes      PT LONG TERM GOAL #6   Title  Patient will improve score on 10MWT to >1.045m to improve independence with ambulation and to decrease risk of falls.    Baseline  8/14: .77 m/s    Time  8    Period  Weeks    Status  New    Target Date  10/29/18      PT LONG TERM GOAL #7   Title  Patient will increase lower extremity functional scale to >60/80 to demonstrate improved functional mobility and increased tolerance with ADLs.    Baseline  8/14: 30/80    Time  8    Period  Weeks    Status  New    Target Date  10/29/18  Plan - 09/03/18 1130    Clinical Impression Statement  Patient demonstrates good progress/meeting short/long term goals. Her gait speed is improving with a decreased reliance upon assistive devices as well as her balance. A continued focus on LE strengthening and single limb stability will progress her mobility and quality of life at this time. Pain is less of an impact on patient mobility however R knee continues to have limitations with stair negotiation. Patient's condition has the potential to improve in response to therapy. Maximum improvement is yet to be obtained. The anticipated improvement is attainable and reasonable in a generally predictable time.Patient will benefit from skilled physical therapy to increase stability and mobility to improve quality of life and decrease falls risk.    Personal Factors and Comorbidities  Comorbidity 1;Comorbidity 3+;Time since onset of injury/illness/exacerbation;Past/Current  Experience;Finances    Comorbidities  DM type II, severe diabetic neuropathy, HTN, arthritis, DDD, GERD    Examination-Activity Limitations  Bathing;Bend;Caring for Others;Carry;Dressing;Sleep;Locomotion Level;Lift;Squat;Stairs;Stand;Toileting;Transfers    Examination-Participation Restrictions  Cleaning;Community Activity;Driving;Laundry;Shop;Volunteer;Yard Work    [employer]  Evolving/Moderate complexity    Rehab Potential  Fair    Clinical Impairments Affecting Rehab Potential  (+) family support, motivation, age; (-) financial limitations, other medical comorbidities, chronicity    PT Frequency  1x / week    PT Duration  8 weeks    PT Treatment/Interventions  ADLs/Self Care Home Management;Aquatic Therapy;Biofeedback;Cryotherapy;Electrical Stimulation;Iontophoresis '4mg'$ /ml Dexamethasone;Moist Heat;Traction;Ultrasound;DME Instruction;Gait training;Stair training;Functional mobility training;Therapeutic activities;Therapeutic exercise;Balance training;Neuromuscular re-education;Patient/family education;Cognitive remediation;Orthotic Fit/Training;Manual techniques;Passive range of motion;Compression bandaging;Energy conservation;Splinting;Taping;Visual/perceptual remediation/compensation;Dry needling    PT Next Visit Plan  continue with current program to progress strengthening and balance    Consulted and Agree with Plan of Care  Patient       Patient will benefit from skilled therapeutic intervention in order to improve the following deficits and impairments:  Abnormal gait, Cardiopulmonary status limiting activity, Decreased activity tolerance, Decreased balance, Decreased endurance, Decreased coordination, Decreased knowledge of use of DME, Decreased mobility, Decreased strength, Difficulty walking, Increased muscle spasms, Impaired perceived functional ability, Impaired flexibility, Increased edema, Decreased range of motion, Impaired vision/preception, Improper body  mechanics, Postural dysfunction, Pain, Impaired sensation  Visit Diagnosis: 1. Muscle weakness (generalized)   2. Other abnormalities of gait and mobility   3. Pain in left leg   4. Acute pain of right knee        Problem List Patient Active Problem List   Diagnosis Date Noted  . Osteoarthritis involving multiple joints 08/04/2018  . Type 2 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, with long-term current use of insulin ([place]) 03/22/2018  . Abnormal EMG (07/06/2017) 11/23/2017  . Long-term insulin use ([place]) 11/10/2017  . Uncontrolled type 2 diabetes mellitus with hyperglycemia ([place]) 11/10/2017  . Vitamin D deficiency 10/21/2017  . DM type 2 with diabetic peripheral neuropathy ([place]) 10/21/2017  . DDD (degenerative disc disease), lumbar 10/21/2017  . Lumbar facet arthropathy 10/21/2017  . Lumbar facet syndrome (Bilateral) 10/21/2017  . Lumbar facet hypertrophy 10/21/2017  . Lumbar foraminal stenosis (L5-S1) (Right) 10/21/2017  . Osteoarthritis of knee (Bilateral) 10/21/2017  . Chronic musculoskeletal pain 10/21/2017  . Essential (primary) hypertension 10/12/2017  . Chronic ankle pain (Primary Area of Pain) (Left) 10/12/2017  . Chronic foot pain (Primary Area of Pain) (Left) 10/12/2017  . Chronic lower extremity pain (Secondary Area of Pain) (Bilateral) (L>R) 10/12/2017  . Chronic knee pain (Fourth Area of Pain) (Bilateral) (L>R) 10/12/2017  . Chronic low back pain [facility]  Area of Pain) (Bilateral) (L>R) w/ sciatica (Left) 10/12/2017  . Chronic pain syndrome 10/12/2017  . Opiate use 10/12/2017  . Pharmacologic therapy 10/12/2017  . Disorder of skeletal system 10/12/2017  . Problems influencing health status 10/12/2017  . Neuropathy 08/11/2017  . Burning sensation of feet 07/06/2017  . Lower extremity numbness and tingling (Left) 07/06/2017  . Polyneuropathy associated with underlying disease ([place]) 05/29/2017  . DM  type 2, uncontrolled, with neuropathy ([place]) 05/05/2017  . Neuropathic pain 06/29/2015  . GERD without esophagitis 01/16/2014    Janna Arch, PT, DPT   09/03/2018, 11:36 AM  [place] [facility] SERVICES [address] [place], [name], [address] Phone: [phone]   Fax:  [phone]  Name: Charlene [name] MRN: [id number] Date of Birth: 11/05/1960

## 2018-09-10 ENCOUNTER — Other Ambulatory Visit: Payer: Self-pay

## 2018-09-10 ENCOUNTER — Ambulatory Visit: Payer: BLUE CROSS/BLUE SHIELD

## 2018-09-10 DIAGNOSIS — M6281 Muscle weakness (generalized): Secondary | ICD-10-CM

## 2018-09-10 DIAGNOSIS — R2689 Other abnormalities of gait and mobility: Secondary | ICD-10-CM

## 2018-09-10 NOTE — Therapy (Signed)
[place] MAIN [facility] SERVICES [address] [place], [name], [address] Phone: [phone]   Fax:  [phone]  Physical Therapy Treatment  Patient Details  Name: Charlene [name] MRN: [id number] Date of Birth: 1960-02-18 Referring Provider (PT): Dr. Gurney Maxin   Encounter Date: 09/10/2018  PT End of Session - 09/10/18 1115    Visit Number  8    Number of Visits  15    Date for PT Re-Evaluation  10/29/18    Authorization Type  8/10 re-eval starting 6/16    PT Start Time  1110    PT Stop Time  1144    PT Time Calculation (min)  34 min    Equipment Utilized During Treatment  Gait belt    Activity Tolerance  Patient tolerated treatment well    Behavior During Therapy  [facility] for tasks assessed/performed       Past Medical History:  Diagnosis Date  . Arthritis    knees  . Degenerative disc disease, lumbar   . Diabetes mellitus without complication ([place])   . GERD (gastroesophageal reflux disease)   . Hypertension   . Neuropathy    left lower leg  . Polyneuropathy   . Polyneuropathy     Past Surgical History:  Procedure Laterality Date  . ABDOMINAL HYSTERECTOMY    . arthroscopic rotator cuff    . CATARACT EXTRACTION W/PHACO Right 11/25/2017   Procedure: CATARACT EXTRACTION PHACO AND INTRAOCULAR LENS PLACEMENT ([place]) RIGHT DIABETIC;  Surgeon: Leandrew Koyanagi, MD;  Location: [place];  Service: Ophthalmology;  Laterality: Right;  Diabetic - insulin and oral meds  . COLONOSCOPY    . endosopic sinus      There were no vitals filed for this visit.  Subjective Assessment - 09/10/18 1114    Subjective  Patient arrived almost 30 minutes late to session, reports she didn't forget just was running behind.  No falls or LOB since last session. Reports having some throbbing pain in foot yesterday but none today.    Pertinent History  New order for patellofemoral pain syndrome of right knee in combination with left LE  weakness/instability/neuropathy.  R knee pain has begun since she was seen last by this physical therapy (pre-COVID closure) and neuropathy  Patient reports she is feeling more unsteady "wobbles" more and feels like she is going to fall back. Getting harder to get up steps now per patient report. Patient reports she fell on 06/09/18 onto R side/forward. PMH includes DM type II and HTN. Patient has been seen/evaluated for LE weakness/instability/neuropathy.    Limitations  Lifting;Standing;Walking;House hold activities    How long can you sit comfortably?  2 hours    How long can you stand comfortably?  10 minutes    How long can you walk comfortably?  15    Patient Stated Goals  be able to walk longer distances, improve balance, to be able to shop, negotiate stairs without assistance    Currently in Pain?  No/denies        Treatment:   airex pad: balance one foot on airex pad one foot on 6" step 2x30 seconds each LE, more challenging with LLE back.   airex pad : 6" step toe taps no UE support. Occasional instability x 15x each LE.    Standing on 1/2 bolster (Flat side up) heel/toe rock x15 with no UE hold for balance but intermittent assist from therapist to prevent falls especially with heel rocking and posterior  Speed/agility ladder: one foot in each square for increase step length and single limb stability with widened BOS x8 lengths of // bars with improving coordination with repetition. Able to demonstrate improved gait mechanics after this intervention.   Adduction/inversion ball squeeze between toes with LAQ 10x    Mini modified squat with UE support holding onto // bars with chair behind to guide depth of squat, cueing for hip hinge and gluteal activation x15  bosu ball lateral lunges x 15 each LE   toy soldiers : BUE support, hip flexion, then knee extension, then hip extension to neutral. 16x each LE       Pt educated throughout session about proper posture and technique with  exercises. Improved exercise technique, movement at target joints, use of target muscles after min to mod verbal, visual, tactile cues.    Patient presents 30  Minutes late to physical therapy session, was kept after appointment time to ensure performance of interventions. Her LLE continues to challenge her stability as she is limited in standing duration and weight acceptance with fatiguing and shaking of extremity. Patient will benefit from skilled physical therapy to increase stability and mobility to improve quality of life and decrease falls risk.                 PT Education - 09/10/18 1115    Education Details  exercise technique, body mechanics    Person(s) Educated  Patient    Methods  Explanation;Demonstration;Tactile cues;Verbal cues    Comprehension  Need further instruction;Verbalized understanding;Returned demonstration;Verbal cues required;Tactile cues required       PT Short Term Goals - 09/03/18 1131      PT SHORT TERM GOAL #1   Title  Patient will be independent in home exercise program to improve strength/mobility for better functional independence with ADLs.     Baseline  8/14: HEP compliant    Time  2    Period  Weeks    Status  Partially Met    Target Date  09/17/18      PT SHORT TERM GOAL #2   Title  Patient will report a worst pain of 6/10 on the VAS during ambulation/functional mobility.    Baseline  6/16: LLE 9/10 R knee 8/10 8/14: 4/10    Time  2    Period  Weeks    Status  Achieved    Target Date  07/20/18        PT Long Term Goals - 09/03/18 1132      PT LONG TERM GOAL #1   Title  Patient will report a worst pain of 3/10 on VAS with ambulation/functional mobility for improved quality of life.    Baseline  6/16; R knee: 8/10 LLE 9/10 8/14: 4/10    Time  8    Period  Weeks    Status  Partially Met    Target Date  10/29/18      PT LONG TERM GOAL #2   Title  Patient will improve score on 10MWT to >0.61ms to improve independence  with ambulation and to decrease risk of falls.    Baseline  6/16:.53 with SCasa [place]8/14: .77 m/s w/o AD    Time  8    Period  Weeks    Status  Achieved      PT LONG TERM GOAL #3   Title  Patient will improve score on [place] Balance Test to at least 50/56 to demonstrate improved balance and safety with functional mobility.  Baseline  6/16: 40/56 8/14: 45/56    Time  8    Period  Weeks    Status  Partially Met    Target Date  10/29/18      PT LONG TERM GOAL #4   Title  Patient will demonstrate BLE strength of 4-/5 to improve gait mechanics and safety with mobility    Baseline  6/16;limited by pain: grossly 2+/5 8/14: L 3/5 R 4-/5    Time  8    Period  Weeks    Status  Partially Met    Target Date  10/29/18      PT LONG TERM GOAL #5   Title  Patient will negotiate 4 steps with supervision using unilateral UE support (L railing ascending, R railing descending) to promote safety and independence in home environment.    Baseline  6/16; patient reports she requires assistance and significant use of UE's 814: unilateral support ascending, bilateral support descending    Time  8    Period  Weeks    Status  Partially Met    Target Date  10/29/18      Additional Long Term Goals   Additional Long Term Goals  Yes      PT LONG TERM GOAL #6   Title  Patient will improve score on 10MWT to >1.45ms to improve independence with ambulation and to decrease risk of falls.    Baseline  8/14: .77 m/s    Time  8    Period  Weeks    Status  New    Target Date  10/29/18      PT LONG TERM GOAL #7   Title  Patient will increase lower extremity functional scale to >60/80 to demonstrate improved functional mobility and increased tolerance with ADLs.    Baseline  8/14: 30/80    Time  8    Period  Weeks    Status  New    Target Date  10/29/18            Plan - 09/10/18 1147    Clinical Impression Statement  Patient presents 325 Minutes late to physical therapy session, was kept after appointment  time to ensure performance of interventions. Her LLE continues to challenge her stability as she is limited in standing duration and weight acceptance with fatiguing and shaking of extremity. Patient will benefit from skilled physical therapy to increase stability and mobility to improve quality of life and decrease falls risk.    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    Rehab Potential  Fair    Clinical Impairments Affecting Rehab Potential  (+) family support, motivation, age; (-) financial limitations, other medical comorbidities, chronicity    PT Frequency  1x / week    PT Duration  8 weeks    PT Treatment/Interventions  ADLs/Self Care Home Management;Aquatic Therapy;Biofeedback;Cryotherapy;Electrical Stimulation;Iontophoresis '4mg'$ /ml Dexamethasone;Moist Heat;Traction;Ultrasound;DME Instruction;Gait training;Stair training;Functional mobility training;Therapeutic activities;Therapeutic exercise;Balance training;Neuromuscular re-education;Patient/family education;Cognitive remediation;Orthotic Fit/Training;Manual techniques;Passive range of motion;Compression bandaging;Energy conservation;Splinting;Taping;Visual/perceptual remediation/compensation;Dry needling    PT Next Visit Plan  continue with current program to progress strengthening and balance    Consulted and Agree with Plan of Care  Patient       Patient  will benefit from skilled therapeutic intervention in order to improve the  following deficits and impairments:  Abnormal gait, Cardiopulmonary status limiting activity, Decreased activity tolerance, Decreased balance, Decreased endurance, Decreased coordination, Decreased knowledge of use of DME, Decreased mobility, Decreased strength, Difficulty walking, Increased muscle spasms, Impaired perceived functional ability, Impaired flexibility, Increased edema, Decreased range of motion, Impaired vision/preception, Improper body mechanics, Postural dysfunction, Pain, Impaired sensation  Visit Diagnosis: Muscle weakness (generalized)  Other abnormalities of gait and mobility     Problem List Patient Active Problem List   Diagnosis Date Noted  . Osteoarthritis involving multiple joints 08/04/2018  . Type 2 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, with long-term current use of insulin ([place]) 03/22/2018  . Abnormal EMG (07/06/2017) 11/23/2017  . Long-term insulin use ([place]) 11/10/2017  . Uncontrolled type 2 diabetes mellitus with hyperglycemia ([place]) 11/10/2017  . Vitamin D deficiency 10/21/2017  . DM type 2 with diabetic peripheral neuropathy ([place]) 10/21/2017  . DDD (degenerative disc disease), lumbar 10/21/2017  . Lumbar facet arthropathy 10/21/2017  . Lumbar facet syndrome (Bilateral) 10/21/2017  . Lumbar facet hypertrophy 10/21/2017  . Lumbar foraminal stenosis (L5-S1) (Right) 10/21/2017  . Osteoarthritis of knee (Bilateral) 10/21/2017  . Chronic musculoskeletal pain 10/21/2017  . Essential (primary) hypertension 10/12/2017  . Chronic ankle pain (Primary Area of Pain) (Left) 10/12/2017  . Chronic foot pain (Primary Area of Pain) (Left) 10/12/2017  . Chronic lower extremity pain (Secondary Area of Pain) (Bilateral) (L>R) 10/12/2017  . Chronic knee pain (Fourth Area of Pain) (Bilateral) (L>R) 10/12/2017  . Chronic low back pain [facility] Area of Pain) (Bilateral) (L>R) w/ sciatica (Left) 10/12/2017  .  Chronic pain syndrome 10/12/2017  . Opiate use 10/12/2017  . Pharmacologic therapy 10/12/2017  . Disorder of skeletal system 10/12/2017  . Problems influencing health status 10/12/2017  . Neuropathy 08/11/2017  . Burning sensation of feet 07/06/2017  . Lower extremity numbness and tingling (Left) 07/06/2017  . Polyneuropathy associated with underlying disease ([place]) 05/29/2017  . DM type 2, uncontrolled, with neuropathy ([place]) 05/05/2017  . Neuropathic pain 06/29/2015  . GERD without esophagitis 01/16/2014   Janna Arch, PT, DPT   09/10/2018, 11:49 AM  [place] MAIN [facility] SERVICES [address] [place], [name], [address] Phone: [phone]   Fax:  [phone]  Name: Charlene [name] MRN: [id number] Date of Birth: 1960/12/15

## 2018-09-15 ENCOUNTER — Other Ambulatory Visit: Payer: Self-pay | Admitting: Internal Medicine

## 2018-09-15 DIAGNOSIS — Z1231 Encounter for screening mammogram for malignant neoplasm of breast: Secondary | ICD-10-CM

## 2018-09-16 ENCOUNTER — Encounter: Payer: Self-pay | Admitting: Pain Medicine

## 2018-09-17 ENCOUNTER — Telehealth: Payer: Self-pay | Admitting: *Deleted

## 2018-09-19 NOTE — Patient Instructions (Signed)
____________________________________________________________________________________________  Medication Recommendations and Reminders  Applies to: All patients receiving prescriptions (written and/or electronic).  Medication Rules & Regulations: These rules and regulations exist for your safety and that of others. They are not flexible and neither are we. Dismissing or ignoring them will be considered "non-compliance" with medication therapy, resulting in complete and irreversible termination of such therapy. (See document titled "Medication Rules" for more details.) In all conscience, because of safety reasons, we cannot continue providing a therapy where the patient does not follow instructions.  Pharmacy of record:   Definition: This is the pharmacy where your electronic prescriptions will be sent.   We do not endorse any particular pharmacy.  You are not restricted in your choice of pharmacy.  The pharmacy listed in the electronic medical record should be the one where you want electronic prescriptions to be sent.  If you choose to change pharmacy, simply notify our nursing staff of your choice of new pharmacy.  Recommendations:  Keep all of your pain medications in a safe place, under lock and key, even if you live alone.   After you fill your prescription, take 1 week's worth of pills and put them away in a safe place. You should keep a separate, properly labeled bottle for this purpose. The remainder should be kept in the original bottle. Use this as your primary supply, until it runs out. Once it's gone, then you know that you have 1 week's worth of medicine, and it is time to come in for a prescription refill. If you do this correctly, it is unlikely that you will ever run out of medicine.  To make sure that the above recommendation works, it is very important that you make sure your medication refill appointments are scheduled at least 1 week before you run out of medicine. To do  this in an effective manner, make sure that you do not leave the office without scheduling your next medication management appointment. Always ask the nursing staff to show you in your prescription , when your medication will be running out. Then arrange for the receptionist to get you a return appointment, at least 7 days before you run out of medicine. Do not wait until you have 1 or 2 pills left, to come in. This is very poor planning and does not take into consideration that we may need to cancel appointments due to bad weather, sickness, or emergencies affecting our staff.  "Partial Fill": If for any reason your pharmacy does not have enough pills/tablets to completely fill or refill your prescription, do not allow for a "partial fill". You will need a separate prescription to fill the remaining amount, which we will not provide. If the reason for the partial fill is your insurance, you will need to talk to the pharmacist about payment alternatives for the remaining tablets, but again, do not accept a partial fill.  Prescription refills and/or changes in medication(s):   Prescription refills, and/or changes in dose or medication, will be conducted only during scheduled medication management appointments. (Applies to both, written and electronic prescriptions.)  No refills on procedure days. No medication will be changed or started on procedure days. No changes, adjustments, and/or refills will be conducted on a procedure day. Doing so will interfere with the diagnostic portion of the procedure.  No phone refills. No medications will be "called into the pharmacy".  No Fax refills.  No weekend refills.  No Holliday refills.  No after hours refills.  Remember:  Business   hours are:  Monday to Thursday 8:00 AM to 4:00 PM Provider's Schedule: Shelise Maron, MD - Appointments are:  Medication management: Monday and Wednesday 8:00 AM to 4:00 PM Procedure day: Tuesday and Thursday 7:30 AM  to 4:00 PM Bilal Lateef, MD - Appointments are:  Medication management: Tuesday and Thursday 8:00 AM to 4:00 PM Procedure day: Monday and Wednesday 7:30 AM to 4:00 PM (Last update: 03/19/2017) ____________________________________________________________________________________________    

## 2018-09-19 NOTE — Progress Notes (Signed)
Pain Management Virtual Encounter Note - Virtual Visit via Telephone Telehealth (real-time audio visits between healthcare provider and patient).   Patient's Phone No. & Preferred Pharmacy:  [phone] (home); [phone] (mobile); (Preferred) [phone] [email or web address]  [name], [name] - [address] [name] [address] Phone: [phone] Fax: [phone]    Pre-screening note:  Our staff contacted [name] and offered her an "in person", "face-to-face" appointment versus a telephone encounter. She indicated preferring the telephone encounter, at this time.   Reason for Virtual Visit: COVID-19*  Social distancing based on CDC and AMA recommendations.   I contacted Georga Kaufmann on 09/20/2018 via telephone.      I clearly identified myself as Gaspar Cola, MD. I verified that I was speaking with the correct person using two identifiers (Name: SUKHMANI HALTERMAN, and date of birth: [birth year]).  Advanced Informed Consent I sought verbal advanced consent from Georga Kaufmann for virtual visit interactions. I informed [name] of possible security and privacy concerns, risks, and limitations associated with providing "not-in-person" medical evaluation and management services. I also informed [name] of the availability of "in-person" appointments. Finally, I informed her that there would be a charge for the virtual visit and that she could be  personally, fully or partially, financially responsible for it. [name] expressed understanding and agreed to proceed.   Historic Elements   Charlene [name] is a 58 y.o. year old, female patient evaluated today after her last encounter by our practice on 09/17/2018. [name]  has a past medical history of Arthritis, Degenerative disc disease, lumbar, Diabetes mellitus without complication ([place]), GERD (gastroesophageal reflux disease), Hypertension, Neuropathy,  Polyneuropathy, and Polyneuropathy. She also  has a past surgical history that includes arthroscopic rotator cuff; Colonoscopy; Abdominal hysterectomy; endosopic sinus; and Cataract extraction w/PHACO (Right, 11/25/2017). [name] has a current medication list which includes the following prescription(s): amlodipine, baclofen, gnp calcium 1200, vitamin d3, dicyclomine, duloxetine, duloxetine, gabapentin, insulin glargine (2 unit dial), lidocaine-menthol, liraglutide, lisinopril, magnesium, meloxicam, pantoprazole, polyethylene glycol, tramadol, carvedilol, and metoprolol succinate. She  reports that she has never smoked. She has never used smokeless tobacco. She reports previous alcohol use. She reports that she does not use drugs. Ms. Dewindt has No Known Allergies.   HPI  Today, she is being contacted for a post-procedure assessment.  The patient indicates doing rather well, but still having a little bit of pain left and she would like to go ahead and complete the series of 3 epidural steroid injections.  Post-Procedure Evaluation  Procedure: Diagnostic left-sided L5 transforaminal ESI #3+ left-sided L4-5 interlaminar LESI #3under fluoroscopic guidance and IV sedation. Pre-procedure pain level:  3/10 Post-procedure: 0/10 (100% relief)  Sedation: Sedation provided.  Effectiveness during initial hour after procedure(Ultra-Short Term Relief): 100 %   Local anesthetic used: Long-acting (4-6 hours) Effectiveness: Defined as any analgesic benefit obtained secondary to the administration of local anesthetics. This carries significant diagnostic value as to the etiological location, or anatomical origin, of the pain. Duration of benefit is expected to coincide with the duration of the local anesthetic used.  Effectiveness during initial 4-6 hours after procedure(Short-Term Relief): 100 %   Long-term benefit: Defined as any relief past the pharmacologic duration of the local anesthetics.  Effectiveness  past the initial 6 hours after procedure(Long-Term Relief): 90 %   Current benefits: Defined as benefit that persist at this time.   Analgesia:  90-100% better Function: [name] reports improvement  in function ROM: [name] reports improvement in ROM   Pharmacotherapy Assessment  Analgesic: Tramadol 50 mg, 1 tab PO q 8 hrs (150 mg/day of tramadol)(enough to last until 02/16/2019) MME/day: 15mg /day.   Monitoring: Pharmacotherapy: No side-effects or adverse reactions reported. [place] PMP: PDMP reviewed during this encounter.       Compliance: No problems identified. Effectiveness: Clinically acceptable. Plan: Refer to "POC".  UDS:  Summary  Date Value Ref Range Status  10/12/2017 FINAL  Final    Comment:    ==================================================================== [facility] COMP DRUG ANALYSIS,UR ==================================================================== Test                             Result       Flag       Units Drug Present and Declared for Prescription Verification   Tramadol                       [id number]       EXPECTED   ng/mg creat   O-Desmethyltramadol            8266         EXPECTED   ng/mg creat   N-Desmethyltramadol            2066         EXPECTED   ng/mg creat    Source of tramadol is a prescription medication.    O-desmethyltramadol and N-desmethyltramadol are expected    metabolites of tramadol.   Gabapentin                     PRESENT      EXPECTED   Baclofen                       PRESENT      EXPECTED   Metoprolol                     PRESENT      EXPECTED Drug Present not Declared for Prescription Verification   Alcohol, Ethyl                 0.154        UNEXPECTED g/dL    Sources of ethyl alcohol include alcoholic beverages or as a    fermentation product of glucose; glucose is present in this    specimen.  Interpret result with caution, as the presence of    ethyl alcohol is likely due, at least in part, to fermentation of     glucose.   Naproxen                       PRESENT      UNEXPECTED Drug Absent but Declared for Prescription Verification   Tizanidine                     Not Detected UNEXPECTED    Tizanidine, as indicated in the declared medication list, is not    always detected even when used as directed.   Duloxetine                     Not Detected UNEXPECTED   Lidocaine                      Not Detected UNEXPECTED    Lidocaine, as indicated in the  declared medication list, is not    always detected even when used as directed. ==================================================================== Test                      Result    Flag   Units      Ref Range   Creatinine              47               mg/dL      >=20 ==================================================================== Declared Medications:  The flagging and interpretation on this report are based on the  following declared medications.  Unexpected results may arise from  inaccuracies in the declared medications.  **Note: The testing scope of this panel includes these medications:  Baclofen  Duloxetine  Gabapentin  Metoprolol  Tramadol  **Note: The testing scope of this panel does not include small to  moderate amounts of these reported medications:  Lidocaine  Tizanidine  **Note: The testing scope of this panel does not include following  reported medications:  Fluticasone  Hydrochlorothiazide (Lisinopril-HCTZ)  Insulin  Lisinopril (Lisinopril-HCTZ)  Metformin  Omeprazole  Pantoprazole  Potassium ==================================================================== For clinical consultation, please call [phone]. ====================================================================    Laboratory Chemistry Profile (12 mo)  Renal: 10/12/2017: BUN/Creatinine Ratio 17 12/17/2017: BUN 14; Creatinine, Ser 0.45  Lab Results  Component Value Date   GFRAA >60 12/17/2017   GFRNONAA >60 12/17/2017   Hepatic: 12/17/2017:  Albumin 4.1 Lab Results  Component Value Date   AST 18 12/17/2017   ALT 18 12/17/2017   Other: 10/12/2017: 25-Hydroxy, Vitamin D 17; 25-Hydroxy, Vitamin D-2 <1.0; 25-Hydroxy, Vitamin D-3 17; CRP 4; Sed Rate 12; Vitamin B-12 1,063 Note: Above Lab results reviewed.  Imaging  Last 90 days:  [facility] C-arm 1-60 Min No Report  Result Date: 08/12/2018 Fluoro was used, but no Radiologist interpretation will be provided. Please refer to "NOTES" tab for provider progress note.  US Abdomen Limited Ruq  Result Date: 06/29/2018 EXAM: ULTRASOUND ABDOMEN LIMITED RIGHT UPPER QUADRANT COMPARISON:  None. FINDINGS: Gallbladder: Small amount of sludge layering within the gallbladder. No visible stones or wall thickening. Negative sonographic Murphy's. Common bile duct: Diameter: Normal caliber, 3 mm Liver: No focal lesion identified. Within normal limits in parenchymal echogenicity. Portal vein is patent on color Doppler imaging with normal direction of blood flow towards the liver. IMPRESSION: Small amount of sludge within the gallbladder. No evidence of cholelithiasis or acute cholecystitis. Electronically Signed   By: Rolm Baptise M.D.   On: 06/29/2018 09:38    Assessment  The primary encounter diagnosis was Chronic pain syndrome. Diagnoses of Chronic foot pain (Primary Area of Pain) (Left), Chronic ankle pain (Primary Area of Pain) (Left), Chronic lower extremity pain (Secondary Area of Pain) (Bilateral) (L>R), Chronic low back pain (Tertiary Area of Pain) (Bilateral) (L>R) w/ sciatica (Left), Chronic knee pain (Fourth Area of Pain) (Bilateral) (L>R), and Lumbar foraminal stenosis (L5-S1) (Right) were also pertinent to this visit.  Plan of Care  I am having Besan L. Twiddy maintain her pantoprazole, metoprolol succinate, DULoxetine, lisinopril, liraglutide, Insulin Glargine (2 Unit Dial), Lidocaine-Menthol (ICY HOT LIDOCAINE PLUS MENTHOL EX), polyethylene glycol, amLODipine, baclofen, GNP Calcium 1200,  Vitamin D3, Magnesium, gabapentin, meloxicam, traMADol, dicyclomine, carvedilol, and DULoxetine.  Pharmacotherapy (Medications Ordered): No orders of the defined types were placed in this encounter.  Orders:  Orders Placed This Encounter  Procedures  . Lumbar Transforaminal Epidural    TIMEFRAME: ASAP  Standing Status:   Future    Standing Expiration Date:   09/20/2019    Scheduling Instructions:     Purpose: Therapeutic     Indication: Lower extremity pain. Radiculopathy/Radiculitis lumbosacral (M54.17).     Side: Left-sided     Level: L5     Sedation: Patient's choice.    Order Specific Question:   Where will this procedure be performed?    Answer:   [facility]  . Lumbar Epidural Injection    TIMEFRAME: ASAP    Standing Status:   Future    Standing Expiration Date:   09/20/2019    Scheduling Instructions:     Purpose: Therapeutic     Indication: Lower extremity pain/Sciatica left (M54.32).     Side: Left-sided     Level: L4-5     Sedation: Patient's choice.    Order Specific Question:   Where will this procedure be performed?    Answer:   [facility]   Follow-up plan:   Return in about 5 months (around 02/15/2019) for (VV), E/M, (MM), in addition, Procedure (w/ Sedation): (L) L5 TFESI + (L) L4-5 LESI #4 (ASAP).      Interventional management options:  Considering:   Diagnostic left LSB Possible left-sided lumbar sympathetic RFA Diagnostic right L5 TFESI Diagnostic bilateral lumbar facet block Possible bilateral lumbar facet RFA Diagnostic bilateral intra-articular knee joint injection (w/ L.A. + steroids)  Diagnostic bilateral genicular NB Possible bilateral genicular nerve RFA   Palliative PRN treatment(s):   Palliative left L5 TFESI #4 + left L4-5 interlaminar LESI #4  Palliative left L5 TFESI #4 Palliative left L4-5 interlaminar LESI #4  Palliative left L5-S1 LESI #3  Palliative bilateral Hyalgan knee series S2N1 (last done on  02/25/2018)    Recent Visits Date Type Provider Dept  08/12/18 Procedure visit Milinda Pointer, MD [facility]  08/04/18 Office Visit Milinda Pointer, MD [facility]  Showing recent visits within past 90 days and meeting all other requirements   Today's Visits Date Type Provider Dept  09/20/18 Office Visit Milinda Pointer, MD [facility]  Showing today's visits and meeting all other requirements   Future Appointments No visits were found meeting these conditions.  Showing future appointments within next 90 days and meeting all other requirements   I discussed the assessment and treatment plan with the patient. The patient was provided an opportunity to ask questions and all were answered. The patient agreed with the plan and demonstrated an understanding of the instructions.  Patient advised to call back or seek an in-person evaluation if the symptoms or condition worsens.  Total duration of non-face-to-face encounter: 12 minutes.  Note by: Gaspar Cola, MD Date: 09/20/2018; Time: 9:44 AM  Note: This dictation was prepared with Dragon dictation. Any transcriptional errors that may result from this process are unintentional.  Disclaimer:  * Given the special circumstances of the COVID-19 pandemic, the federal government has announced that the Office for Civil Rights (OCR) will exercise its enforcement discretion and will not impose penalties on physicians using telehealth in the event of noncompliance with regulatory requirements under the [place] (HIPAA) in connection with the good faith provision of telehealth during the XX123456 national public health emergency. ([place])

## 2018-09-20 ENCOUNTER — Other Ambulatory Visit: Payer: Self-pay

## 2018-09-20 ENCOUNTER — Ambulatory Visit: Payer: BLUE CROSS/BLUE SHIELD | Attending: Pain Medicine | Admitting: Pain Medicine

## 2018-09-20 DIAGNOSIS — M79604 Pain in right leg: Secondary | ICD-10-CM | POA: Diagnosis not present

## 2018-09-20 DIAGNOSIS — M79605 Pain in left leg: Secondary | ICD-10-CM

## 2018-09-20 DIAGNOSIS — M48061 Spinal stenosis, lumbar region without neurogenic claudication: Secondary | ICD-10-CM

## 2018-09-20 DIAGNOSIS — G894 Chronic pain syndrome: Secondary | ICD-10-CM | POA: Diagnosis not present

## 2018-09-20 DIAGNOSIS — M25572 Pain in left ankle and joints of left foot: Secondary | ICD-10-CM | POA: Diagnosis not present

## 2018-09-20 DIAGNOSIS — G8929 Other chronic pain: Secondary | ICD-10-CM

## 2018-09-20 DIAGNOSIS — M25562 Pain in left knee: Secondary | ICD-10-CM

## 2018-09-20 DIAGNOSIS — M25561 Pain in right knee: Secondary | ICD-10-CM

## 2018-09-20 DIAGNOSIS — M5442 Lumbago with sciatica, left side: Secondary | ICD-10-CM

## 2018-09-20 DIAGNOSIS — M79672 Pain in left foot: Secondary | ICD-10-CM | POA: Diagnosis not present

## 2018-09-21 ENCOUNTER — Ambulatory Visit
Admission: RE | Admit: 2018-09-21 | Discharge: 2018-09-21 | Disposition: A | Discharge status: Home / Self Care | Payer: BLUE CROSS/BLUE SHIELD | Source: Ambulatory Visit | Attending: Pain Medicine | Admitting: Pain Medicine

## 2018-09-21 ENCOUNTER — Ambulatory Visit (HOSPITAL_BASED_OUTPATIENT_CLINIC_OR_DEPARTMENT_OTHER): Payer: BLUE CROSS/BLUE SHIELD | Admitting: Pain Medicine

## 2018-09-21 ENCOUNTER — Encounter: Payer: Self-pay | Admitting: Pain Medicine

## 2018-09-21 ENCOUNTER — Other Ambulatory Visit: Payer: Self-pay

## 2018-09-21 VITALS — BP 169/110 | HR 82 | Temp 98.1°F | Resp 20 | Ht 59.0 in | Wt 126.0 lb

## 2018-09-21 DIAGNOSIS — M79604 Pain in right leg: Secondary | ICD-10-CM | POA: Diagnosis present

## 2018-09-21 DIAGNOSIS — R200 Anesthesia of skin: Secondary | ICD-10-CM | POA: Diagnosis present

## 2018-09-21 DIAGNOSIS — M48061 Spinal stenosis, lumbar region without neurogenic claudication: Secondary | ICD-10-CM | POA: Insufficient documentation

## 2018-09-21 DIAGNOSIS — M5442 Lumbago with sciatica, left side: Secondary | ICD-10-CM | POA: Insufficient documentation

## 2018-09-21 DIAGNOSIS — R202 Paresthesia of skin: Secondary | ICD-10-CM | POA: Diagnosis present

## 2018-09-21 DIAGNOSIS — M51369 Other intervertebral disc degeneration, lumbar region without mention of lumbar back pain or lower extremity pain: Secondary | ICD-10-CM

## 2018-09-21 DIAGNOSIS — M79605 Pain in left leg: Secondary | ICD-10-CM | POA: Diagnosis present

## 2018-09-21 DIAGNOSIS — M5136 Other intervertebral disc degeneration, lumbar region: Secondary | ICD-10-CM | POA: Insufficient documentation

## 2018-09-21 DIAGNOSIS — G8929 Other chronic pain: Secondary | ICD-10-CM | POA: Diagnosis present

## 2018-09-21 MED ORDER — ROPIVACAINE HCL 2 MG/ML IJ SOLN
1.0000 mL | Freq: Once | INTRAMUSCULAR | Status: AC
  Administered 2018-09-21: 1 mL via EPIDURAL
  Filled 2018-09-21: qty 10

## 2018-09-21 MED ORDER — SODIUM CHLORIDE 0.9% FLUSH
1.0000 mL | Freq: Once | INTRAVENOUS | Status: AC
  Administered 2018-09-21: 1 mL

## 2018-09-21 MED ORDER — MIDAZOLAM HCL 5 MG/5ML IJ SOLN
1.0000 mg | INTRAMUSCULAR | Status: DC | PRN
  Administered 2018-09-21: 3 mg via INTRAVENOUS
  Filled 2018-09-21: qty 5

## 2018-09-21 MED ORDER — ROPIVACAINE HCL 2 MG/ML IJ SOLN
2.0000 mL | Freq: Once | INTRAMUSCULAR | Status: AC
  Administered 2018-09-21: 2 mL via EPIDURAL
  Filled 2018-09-21: qty 10

## 2018-09-21 MED ORDER — SODIUM CHLORIDE (PF) 0.9 % IJ SOLN
INTRAMUSCULAR | Status: AC
  Filled 2018-09-21: qty 10

## 2018-09-21 MED ORDER — LIDOCAINE HCL 2 % IJ SOLN
20.0000 mL | Freq: Once | INTRAMUSCULAR | Status: AC
  Administered 2018-09-21: 400 mg

## 2018-09-21 MED ORDER — SODIUM CHLORIDE 0.9% FLUSH
2.0000 mL | Freq: Once | INTRAVENOUS | Status: AC
  Administered 2018-09-21: 2 mL

## 2018-09-21 MED ORDER — IOHEXOL 180 MG/ML  SOLN
10.0000 mL | Freq: Once | INTRAMUSCULAR | Status: AC
  Administered 2018-09-21: 10 mL via EPIDURAL

## 2018-09-21 MED ORDER — TRIAMCINOLONE ACETONIDE 40 MG/ML IJ SUSP
40.0000 mg | Freq: Once | INTRAMUSCULAR | Status: AC
  Administered 2018-09-21: 40 mg
  Filled 2018-09-21: qty 1

## 2018-09-21 MED ORDER — FENTANYL CITRATE (PF) 100 MCG/2ML IJ SOLN
25.0000 ug | INTRAMUSCULAR | Status: DC | PRN
  Administered 2018-09-21: 15:00:00 50 ug via INTRAVENOUS
  Filled 2018-09-21: qty 2

## 2018-09-21 MED ORDER — LACTATED RINGERS IV SOLN
1000.0000 mL | Freq: Once | INTRAVENOUS | Status: AC
  Administered 2018-09-21: 1000 mL via INTRAVENOUS

## 2018-09-21 MED ORDER — DEXAMETHASONE SODIUM PHOSPHATE 10 MG/ML IJ SOLN
10.0000 mg | Freq: Once | INTRAMUSCULAR | Status: AC
  Administered 2018-09-21: 10 mg
  Filled 2018-09-21: qty 1

## 2018-09-21 NOTE — Progress Notes (Addendum)
Patient's Name: Charlene [name]  MRN: [id number]  Referring Provider: Tracie Harrier, MD  DOB: 1960/10/12  PCP: Tracie Harrier, MD  DOS: 09/21/2018  Note by: Gaspar Cola, MD  Service setting: Ambulatory outpatient  Specialty: Interventional Pain Management  Patient type: Established  Location: [facility] (AMB) [facility] Facility  Visit type: Interventional Procedure   Primary Reason for Visit: Interventional Pain Management Treatment. CC: Back Pain (lower)  Procedure #1:  Anesthesia, Analgesia, Anxiolysis:  Type: Therapeutic Trans-Foraminal Epidural Steroid Injection   #4  Region: Lumbar Level: L5 Paravertebral Laterality: Left Paravertebral   Type: Moderate (Conscious) Sedation combined with Local Anesthesia Indication(s): Analgesia and Anxiety Route: Intravenous (IV) IV Access: Secured Sedation: Meaningful verbal contact was maintained at all times during the procedure  Local Anesthetic: Lidocaine 1-2%  Position: Prone  Procedure #2:    Type: Therapeutic Inter-Laminar Epidural Steroid Injection   #4  Region: Lumbar Level: L4-5 Level. Laterality: Left Paramedial     Indications: 1. DDD (degenerative disc disease), lumbar   2. Chronic lower extremity pain (Secondary Area of Pain) (Bilateral) (L>R)   3. 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 Lumbar foraminal stenosis (L5-S1) (Right)   5. Lower extremity numbness and tingling (Left)    Pain Score: Pre-procedure: 3 /10 Post-procedure: 0-No pain/10   Pre-op Assessment:  [name] is a 58 y.o. (year old), female patient, seen today for interventional treatment. She  has a past surgical history that includes arthroscopic rotator cuff; Colonoscopy; Abdominal hysterectomy; endosopic sinus; and Cataract extraction w/PHACO (Right, 11/25/2017). [name] has a current medication list which includes the following prescription(s): amlodipine, baclofen, gnp calcium 1200,  carvedilol, vitamin d3, dicyclomine, duloxetine, duloxetine, gabapentin, insulin glargine (2 unit dial), lidocaine-menthol, liraglutide, lisinopril, magnesium, meloxicam, metoprolol succinate, pantoprazole, polyethylene glycol, and tramadol, and the following Facility-Administered Medications: fentanyl and midazolam. Her primarily concern today is the Back Pain (lower)  Initial Vital Signs:  Pulse/HCG Rate: 82ECG Heart Rate: 92 Temp: 98.2 F (36.8 C) Resp: 16 BP: (!) 176/91 SpO2: 100 %  BMI: Estimated body mass index is 25.45 kg/m as calculated from the following:   Height as of this encounter: 4\' 11"  (1.499 m).   Weight as of this encounter: 126 lb (57.2 kg).  Risk Assessment: Allergies: Reviewed. She has No Known Allergies.  Allergy Precautions: None required Coagulopathies: Reviewed. None identified.  Blood-thinner therapy: None at this time Active Infection(s): Reviewed. None identified. [name] is afebrile  Site Confirmation: [name] was asked to confirm the procedure and laterality before marking the site Procedure checklist: Completed Consent: Before the procedure and under the influence of no sedative(s), amnesic(s), or anxiolytics, the patient was informed of the treatment options, risks and possible complications. To fulfill our ethical and legal obligations, as recommended by the American Medical Association's Code of Ethics, I have informed the patient of my clinical impression; the nature and purpose of the treatment or procedure; the risks, benefits, and possible complications of the intervention; the alternatives, including doing nothing; the risk(s) and benefit(s) of the alternative treatment(s) or procedure(s); and the risk(s) and benefit(s) of doing nothing. The patient was provided information about the general risks and possible complications associated with the procedure. These may include, but are not limited to: failure to achieve desired goals, infection,  bleeding, organ or nerve damage, allergic reactions, paralysis, and death. In addition, the patient was informed of those risks and complications associated to Spine-related procedures, such as failure to  decrease pain; infection (i.e.: Meningitis, epidural or intraspinal abscess); bleeding (i.e.: epidural hematoma, subarachnoid hemorrhage, or any other type of intraspinal or peri-dural bleeding); organ or nerve damage (i.e.: Any type of peripheral nerve, nerve root, or spinal cord injury) with subsequent damage to sensory, motor, and/or autonomic systems, resulting in permanent pain, numbness, and/or weakness of one or several areas of the body; allergic reactions; (i.e.: anaphylactic reaction); and/or death. Furthermore, the patient was informed of those risks and complications associated with the medications. These include, but are not limited to: allergic reactions (i.e.: anaphylactic or anaphylactoid reaction(s)); adrenal axis suppression; blood sugar elevation that in diabetics may result in ketoacidosis or comma; water retention that in patients with history of congestive heart failure may result in shortness of breath, pulmonary edema, and decompensation with resultant heart failure; weight gain; swelling or edema; medication-induced neural toxicity; particulate matter embolism and blood vessel occlusion with resultant organ, and/or nervous system infarction; and/or aseptic necrosis of one or more joints. Finally, the patient was informed that Medicine is not an exact science; therefore, there is also the possibility of unforeseen or unpredictable risks and/or possible complications that may result in a catastrophic outcome. The patient indicated having understood very clearly. We have given the patient no guarantees and we have made no promises. Enough time was given to the patient to ask questions, all of which were answered to the patient's satisfaction. [name] has indicated that she wanted to  continue with the procedure. Attestation: I, the ordering provider, attest that I have discussed with the patient the benefits, risks, side-effects, alternatives, likelihood of achieving goals, and potential problems during recovery for the procedure that I have provided informed consent. Date  Time: 09/21/2018  1:32 PM  Pre-Procedure Preparation:  Monitoring: As per clinic protocol. Respiration, ETCO2, SpO2, BP, heart rate and rhythm monitor placed and checked for adequate function Safety Precautions: Patient was assessed for positional comfort and pressure points before starting the procedure. Time-out: I initiated and conducted the "Time-out" before starting the procedure, as per protocol. The patient was asked to participate by confirming the accuracy of the "Time Out" information. Verification of the correct person, site, and procedure were performed and confirmed by me, the nursing staff, and the patient. "Time-out" conducted as per Joint Commission's Universal Protocol (UP.01.01.01). Time: 1455  Description of Procedure #1:  Target Area: The inferior and lateral portion of the pedicle, just lateral to a line created by the 6:00 position of the pedicle and the superior articular process of the vertebral body below. On the lateral view, this target lies just posterior to the anterior aspect of the lamina and posterior to the midpoint created between the anterior and the posterior aspect of the neural foramina. Approach: Posterior paravertebral approach. Area Prepped: Entire Posterior Lumbosacral Region Prepping solution: DuraPrep (Iodine Povacrylex [0.7% available iodine] and Isopropyl Alcohol, 74% w/w) Safety Precautions: Aspiration looking for blood return was conducted prior to all injections. At no point did we inject any substances, as a needle was being advanced. No attempts were made at seeking any paresthesias. Safe injection practices and needle disposal techniques used. Medications  properly checked for expiration dates. SDV (single dose vial) medications used.  Description of the Procedure: Protocol guidelines were followed. The patient was placed in position over the fluoroscopy table. The target area was identified and the area prepped in the usual manner. Skin & deeper tissues infiltrated with local anesthetic. Appropriate amount of time allowed to pass for local anesthetics to take effect.  The procedure needles were then advanced to the target area. Proper needle placement secured. Negative aspiration confirmed. Solution injected in intermittent fashion, asking for systemic symptoms every 0.2cc of injectate. The needles were then removed and the area cleansed, making sure to leave some of the prepping solution back to take advantage of its long term bactericidal properties.  Start Time: 1455 hrs.  Materials:  Needle(s) Type: Spinal Needle Gauge: 22G Length: 3.5-in Medication(s): Please see orders for medications and dosing details.  Description of Procedure #2:  Target Area: The  interlaminar space, initially targeting the lower border of the superior vertebral body lamina. Approach: Posterior paramedial approach. Area Prepped: Same as above Prepping solution: Same as above Safety Precautions: Same as above  Description of the Procedure: Protocol guidelines were followed. The patient was placed in position over the fluoroscopy table. The target area was identified and the area prepped in the usual manner. Skin & deeper tissues infiltrated with local anesthetic. Appropriate amount of time allowed to pass for local anesthetics to take effect. The procedure needle was introduced through the skin, ipsilateral to the reported pain, and advanced to the target area. Bone was contacted and the needle walked caudad, until the lamina was cleared. The ligamentum flavum was engaged and loss-of-resistance technique used as the epidural needle was advanced. The epidural space was  identified using "loss-of-resistance technique" with 2-3 ml of PF-NaCl (0.9% NSS), in a 5cc LOR glass syringe. Proper needle placement secured. Negative aspiration confirmed. Solution injected in intermittent fashion, asking for systemic symptoms every 0.5cc of injectate. The needles were then removed and the area cleansed, making sure to leave some of the prepping solution back to take advantage of its long term bactericidal properties.  Vitals:   09/21/18 1509 09/21/18 1519 09/21/18 1529 09/21/18 1539  BP: (!) 144/90 (!) 158/87 (!) 158/115 (!) 169/110  Pulse:      Resp: 15 18 20 20   Temp:  98.4 F (36.9 C)  98.1 F (36.7 C)  SpO2: 96% 100% 100% 100%  Weight:      Height:        End Time: 1508 hrs.  Materials:  Needle(s) Type: Epidural needle Gauge: 17G Length: 3.5-in Medication(s): Please see orders for medications and dosing details.  Imaging Guidance (Spinal):          Type of Imaging Technique: Fluoroscopy Guidance (Spinal) Indication(s): Assistance in needle guidance and placement for procedures requiring needle placement in or near specific anatomical locations not easily accessible without such assistance. Exposure Time: Please see nurses notes. Contrast: Before injecting any contrast, we confirmed that the patient did not have an allergy to iodine, shellfish, or radiological contrast. Once satisfactory needle placement was completed at the desired level, radiological contrast was injected. Contrast injected under live fluoroscopy. No contrast complications. See chart for type and volume of contrast used. Fluoroscopic Guidance: I was personally present during the use of fluoroscopy. "Tunnel Vision Technique" used to obtain the best possible view of the target area. Parallax error corrected before commencing the procedure. "Direction-depth-direction" technique used to introduce the needle under continuous pulsed fluoroscopy. Once target was reached, antero-posterior, oblique, and  lateral fluoroscopic projection used confirm needle placement in all planes. Images permanently stored in EMR. Interpretation: I personally interpreted the imaging intraoperatively. Adequate needle placement confirmed in multiple planes. Appropriate spread of contrast into desired area was observed. No evidence of afferent or efferent intravascular uptake. No intrathecal or subarachnoid spread observed. Permanent images saved into the patient's record.  Antibiotic  Prophylaxis:   Anti-infectives (From admission, onward)   None     Indication(s): None identified  Post-operative Assessment:  Post-procedure Vital Signs:  Pulse/HCG Rate: 8298 Temp: 98.1 F (36.7 C) Resp: 20 BP: (!) 169/110 SpO2: 100 %  EBL: None  Complications: No immediate post-treatment complications observed by team, or reported by patient.  Note: The patient tolerated the entire procedure well. A repeat set of vitals were taken after the procedure and the patient was kept under observation following institutional policy, for this type of procedure. Post-procedural neurological assessment was performed, showing return to baseline, prior to discharge. The patient was provided with post-procedure discharge instructions, including a section on how to identify potential problems. Should any problems arise concerning this procedure, the patient was given instructions to immediately contact us, at any time, without hesitation. In any case, we plan to contact the patient by telephone for a follow-up status report regarding this interventional procedure.  Comments:  No additional relevant information.  Plan of Care  Orders:  Orders Placed This Encounter  Procedures  . Lumbar Transforaminal Epidural    Scheduling Instructions:     Side: Left-sided     Level: L5     Sedation: With Sedation.     Timeframe: Today    Order Specific Question:   Where will this procedure be performed?    Answer:   [facility]  .  Lumbar Epidural Injection    Scheduling Instructions:     Procedure: Interlaminar LESI L4-5     Laterality: Left-sided     Sedation: With Sedation     Timeframe:  Today    Order Specific Question:   Where will this procedure be performed?    Answer:   [facility]  . DG PAIN [facility] C-ARM 1-60 MIN NO REPORT    Intraoperative interpretation by procedural physician at [place].    Standing Status:   Standing    Number of Occurrences:   1    Order Specific Question:   Reason for exam:    Answer:   Assistance in needle guidance and placement for procedures requiring needle placement in or near specific anatomical locations not easily accessible without such assistance.  . Provider attestation of informed consent for procedure/surgical case    I, the ordering provider, attest that I have discussed with the patient the benefits, risks, side effects, alternatives, likelihood of achieving goals and potential problems during recovery for the procedure that I have provided informed consent.    Standing Status:   Standing    Number of Occurrences:   1  . Informed Consent Details: Transcribe to consent form and obtain patient signature    Standing Status:   Standing    Number of Occurrences:   1    Order Specific Question:   Procedure    Answer:   Lumbar epidural steroid injection under fluoroscopic guidance. (See notes for level and laterality.)    Order Specific Question:   Surgeon    Answer:   Eyvonne Burchfield A. Dossie Arbour, MD    Order Specific Question:   Indication/Reason    Answer:   Low back pain and/or leg pain secondary to lumbar radiculitis/radiculopathy  . Informed Consent Details: Transcribe to consent form and obtain patient signature    Standing Status:   Standing    Number of Occurrences:   1    Order Specific Question:   Procedure    Answer:   Lumbar transforaminal epidural steroid injection under fluoroscopic  guidance. (See notes for level and laterality.)    Order  Specific Question:   Surgeon    Answer:   Roshonda Sperl A. Dossie Arbour, MD    Order Specific Question:   Indication/Reason    Answer:   Lumbar radiculopathy/radiculitis   Chronic Opioid Analgesic:  Tramadol 50 mg, 1 tab PO q 8 hrs (150 mg/day of tramadol)(enough to last until 02/16/2019) MME/day: 15mg /day.   Medications ordered for procedure: Meds ordered this encounter  Medications  . iohexol (OMNIPAQUE) 180 MG/ML injection 10 mL    Must be Myelogram-compatible. If not available, you may substitute with a water-soluble, non-ionic, hypoallergenic, myelogram-compatible radiological contrast medium.  Marland Kitchen lidocaine (XYLOCAINE) 2 % (with pres) injection 400 mg  . lactated ringers infusion 1,000 mL  . midazolam (VERSED) 5 MG/5ML injection 1-2 mg    Make sure Flumazenil is available in the pyxis when using this medication. If oversedation occurs, administer 0.2 mg IV over 15 sec. If after 45 sec no response, administer 0.2 mg again over 1 min; may repeat at 1 min intervals; not to exceed 4 doses (1 mg)  . fentaNYL (SUBLIMAZE) injection 25-50 mcg    Make sure Narcan is available in the pyxis when using this medication. In the event of respiratory depression (RR< 8/min): Titrate NARCAN (naloxone) in increments of 0.1 to 0.2 mg IV at 2-3 minute intervals, until desired degree of reversal.  . sodium chloride flush (NS) 0.9 % injection 2 mL  . ropivacaine (PF) 2 mg/mL (0.2%) (NAROPIN) injection 2 mL  . triamcinolone acetonide (KENALOG-40) injection 40 mg  . sodium chloride flush (NS) 0.9 % injection 1 mL  . ropivacaine (PF) 2 mg/mL (0.2%) (NAROPIN) injection 1 mL  . dexamethasone (DECADRON) injection 10 mg   Medications administered: We administered iohexol, lidocaine, lactated ringers, midazolam, fentaNYL, sodium chloride flush, ropivacaine (PF) 2 mg/mL (0.2%), triamcinolone acetonide, sodium chloride flush, ropivacaine (PF) 2 mg/mL (0.2%), and dexamethasone.  See the medical record for exact dosing,  route, and time of administration.  Follow-up plan:   Return in about 2 weeks (around 10/05/2018) for (VV), (PP).       Interventional management options:  Considering:   Diagnostic left LSB Possible left-sided lumbar sympathetic RFA Diagnostic right L5 TFESI Diagnostic bilateral lumbar facet block Possible bilateral lumbar facet RFA Diagnostic bilateral intra-articular knee joint injection (w/ L.A. + steroids)  Diagnostic bilateral genicular NB Possible bilateral genicular nerve RFA   Palliative PRN treatment(s):   Palliative left L5 TFESI #4 + left L4-5 interlaminar LESI #4  Palliative left L5 TFESI #4 Palliative left L4-5 interlaminar LESI #4  Palliative left L5-S1 LESI #3  Palliative bilateral Hyalgan knee series S2N1 (last done on 02/25/2018)     Recent Visits Date Type Provider Dept  09/20/18 Office Visit Milinda Pointer, MD [facility]  08/12/18 Procedure visit Milinda Pointer, MD [facility]  08/04/18 Office Visit Milinda Pointer, MD [facility]  Showing recent visits within past 90 days and meeting all other requirements   Today's Visits Date Type Provider Dept  09/21/18 Procedure visit Milinda Pointer, MD [facility]  Showing today's visits and meeting all other requirements   Future Appointments Date Type Provider Dept  10/06/18 Appointment Milinda Pointer, MD [facility]  Showing future appointments within next 90 days and meeting all other requirements   Disposition: Discharge home  Discharge Date & Time: 09/21/2018; 1540 hrs.   Primary Care Physician: Tracie Harrier, MD Location: [facility] Note by:  Gaspar Cola, MD Date: 09/21/2018; Time: 3:59 PM  Disclaimer:  Medicine is not an Chief Strategy Officer. The only guarantee in medicine is that nothing is guaranteed. It is important to note that the decision to proceed with this intervention was based on the  information collected from the patient. The Data and conclusions were drawn from the patient's questionnaire, the interview, and the physical examination. Because the information was provided in large part by the patient, it cannot be guaranteed that it has not been purposely or unconsciously manipulated. Every effort has been made to obtain as much relevant data as possible for this evaluation. It is important to note that the conclusions that lead to this procedure are derived in large part from the available data. Always take into account that the treatment will also be dependent on availability of resources and existing treatment guidelines, considered by other Pain Management Practitioners as being common knowledge and practice, at the time of the intervention. For Medico-Legal purposes, it is also important to point out that variation in procedural techniques and pharmacological choices are the acceptable norm. The indications, contraindications, technique, and results of the above procedure should only be interpreted and judged by a Board-Certified Interventional Pain Specialist with extensive familiarity and expertise in the same exact procedure and technique.

## 2018-09-21 NOTE — Progress Notes (Signed)
Safety precautions to be maintained throughout the outpatient stay will include: orient to surroundings, keep bed in low position, maintain call bell within reach at all times, provide assistance with transfer out of bed and ambulation.  

## 2018-09-21 NOTE — Patient Instructions (Signed)

## 2018-09-22 ENCOUNTER — Telehealth: Payer: Self-pay

## 2018-09-22 NOTE — Telephone Encounter (Signed)
Pt was called and no problems reported. 

## 2018-09-29 ENCOUNTER — Ambulatory Visit: Payer: BLUE CROSS/BLUE SHIELD | Attending: Neurology

## 2018-10-04 ENCOUNTER — Encounter: Payer: Self-pay | Admitting: Pain Medicine

## 2018-10-05 ENCOUNTER — Encounter: Payer: Self-pay | Admitting: Emergency Medicine

## 2018-10-05 ENCOUNTER — Inpatient Hospital Stay
Admission: EM | Admit: 2018-10-05 | Discharge: 2018-10-07 | DRG: 054 | Disposition: A | Discharge status: Home / Self Care | Payer: BLUE CROSS/BLUE SHIELD | Attending: Internal Medicine | Admitting: Internal Medicine

## 2018-10-05 ENCOUNTER — Other Ambulatory Visit: Payer: Self-pay

## 2018-10-05 ENCOUNTER — Emergency Department: Payer: BLUE CROSS/BLUE SHIELD

## 2018-10-05 DIAGNOSIS — M549 Dorsalgia, unspecified: Secondary | ICD-10-CM | POA: Diagnosis present

## 2018-10-05 DIAGNOSIS — Z791 Long term (current) use of non-steroidal anti-inflammatories (NSAID): Secondary | ICD-10-CM

## 2018-10-05 DIAGNOSIS — D329 Benign neoplasm of meninges, unspecified: Secondary | ICD-10-CM | POA: Diagnosis present

## 2018-10-05 DIAGNOSIS — E1140 Type 2 diabetes mellitus with diabetic neuropathy, unspecified: Secondary | ICD-10-CM

## 2018-10-05 DIAGNOSIS — G894 Chronic pain syndrome: Secondary | ICD-10-CM | POA: Diagnosis present

## 2018-10-05 DIAGNOSIS — K219 Gastro-esophageal reflux disease without esophagitis: Secondary | ICD-10-CM | POA: Diagnosis present

## 2018-10-05 DIAGNOSIS — Z803 Family history of malignant neoplasm of breast: Secondary | ICD-10-CM

## 2018-10-05 DIAGNOSIS — G936 Cerebral edema: Secondary | ICD-10-CM | POA: Diagnosis present

## 2018-10-05 DIAGNOSIS — M159 Polyosteoarthritis, unspecified: Secondary | ICD-10-CM | POA: Diagnosis present

## 2018-10-05 DIAGNOSIS — E1151 Type 2 diabetes mellitus with diabetic peripheral angiopathy without gangrene: Secondary | ICD-10-CM | POA: Diagnosis present

## 2018-10-05 DIAGNOSIS — D320 Benign neoplasm of cerebral meninges: Principal | ICD-10-CM | POA: Diagnosis present

## 2018-10-05 DIAGNOSIS — Z811 Family history of alcohol abuse and dependence: Secondary | ICD-10-CM

## 2018-10-05 DIAGNOSIS — M5136 Other intervertebral disc degeneration, lumbar region: Secondary | ICD-10-CM | POA: Diagnosis present

## 2018-10-05 DIAGNOSIS — Z20828 Contact with and (suspected) exposure to other viral communicable diseases: Secondary | ICD-10-CM | POA: Diagnosis present

## 2018-10-05 DIAGNOSIS — I10 Essential (primary) hypertension: Secondary | ICD-10-CM | POA: Diagnosis present

## 2018-10-05 DIAGNOSIS — IMO0002 Reserved for concepts with insufficient information to code with codable children: Secondary | ICD-10-CM

## 2018-10-05 DIAGNOSIS — Z794 Long term (current) use of insulin: Secondary | ICD-10-CM

## 2018-10-05 DIAGNOSIS — E1165 Type 2 diabetes mellitus with hyperglycemia: Secondary | ICD-10-CM | POA: Diagnosis present

## 2018-10-05 LAB — COMPREHENSIVE METABOLIC PANEL
ALT: 20 U/L (ref 0–44)
AST: 21 U/L (ref 15–41)
Albumin: 4.2 g/dL (ref 3.5–5.0)
Alkaline Phosphatase: 94 U/L (ref 38–126)
Anion gap: 12 (ref 5–15)
BUN: 7 mg/dL (ref 6–20)
CO2: 29 mmol/L (ref 22–32)
Calcium: 10.4 mg/dL — ABNORMAL HIGH (ref 8.9–10.3)
Chloride: 93 mmol/L — ABNORMAL LOW (ref 98–111)
Creatinine, Ser: 0.53 mg/dL (ref 0.44–1.00)
GFR calc Af Amer: >60 mL/min (ref >60)
GFR calc non Af Amer: >60 mL/min (ref >60)
Glucose, Bld: 435 mg/dL — ABNORMAL HIGH (ref 70–99)
Potassium: 3.9 mmol/L (ref 3.5–5.1)
Sodium: 134 mmol/L — ABNORMAL LOW (ref 135–145)
Total Bilirubin: 0.6 mg/dL (ref 0.3–1.2)
Total Protein: 7.3 g/dL (ref 6.5–8.1)

## 2018-10-05 LAB — CBC WITH DIFFERENTIAL/PLATELET
Abs Immature Granulocytes: 0.06 10*3/uL (ref 0.00–0.07)
Basophils Absolute: 0 10*3/uL (ref 0.0–0.1)
Basophils Relative: 0 %
Eosinophils Absolute: 0.1 10*3/uL (ref 0.0–0.5)
Eosinophils Relative: 1 %
HCT: 39.2 % (ref 36.0–46.0)
Hemoglobin: 12.7 g/dL (ref 12.0–15.0)
Immature Granulocytes: 1 %
Lymphocytes Relative: 26 %
Lymphs Abs: 2.8 10*3/uL (ref 0.7–4.0)
MCH: 28 pg (ref 26.0–34.0)
MCHC: 32.4 g/dL (ref 30.0–36.0)
MCV: 86.3 fL (ref 80.0–100.0)
Monocytes Absolute: 0.6 10*3/uL (ref 0.1–1.0)
Monocytes Relative: 6 %
Neutro Abs: 7.1 10*3/uL (ref 1.7–7.7)
Neutrophils Relative %: 66 %
Platelets: 317 10*3/uL (ref 150–400)
RBC: 4.54 MIL/uL (ref 3.87–5.11)
RDW: 12.6 % (ref 11.5–15.5)
WBC: 10.8 10*3/uL — ABNORMAL HIGH (ref 4.0–10.5)
nRBC: 0 % (ref 0.0–0.2)

## 2018-10-05 LAB — TROPONIN I (HIGH SENSITIVITY): Troponin I (High Sensitivity): 5 ng/L (ref <18)

## 2018-10-05 MED ORDER — INSULIN GLARGINE 100 UNIT/ML ~~LOC~~ SOLN
110.0000 [IU] | Freq: Once | SUBCUTANEOUS | Status: AC
  Administered 2018-10-06: 110 [IU] via SUBCUTANEOUS
  Filled 2018-10-05: qty 1.1

## 2018-10-05 MED ORDER — SODIUM CHLORIDE 0.9 % IV BOLUS
1000.0000 mL | Freq: Once | INTRAVENOUS | Status: AC
  Administered 2018-10-05: 1000 mL via INTRAVENOUS

## 2018-10-05 MED ORDER — PANTOPRAZOLE SODIUM 40 MG PO TBEC
40.0000 mg | DELAYED_RELEASE_TABLET | Freq: Once | ORAL | Status: AC
  Administered 2018-10-06: 40 mg via ORAL
  Filled 2018-10-05: qty 1

## 2018-10-05 MED ORDER — CARVEDILOL 25 MG PO TABS
25.0000 mg | ORAL_TABLET | Freq: Once | ORAL | Status: AC
  Administered 2018-10-06: 25 mg via ORAL
  Filled 2018-10-05: qty 1

## 2018-10-05 NOTE — ED Notes (Signed)
Daughter called to check on patient. Patient informed to call daughter.

## 2018-10-05 NOTE — ED Triage Notes (Signed)
Patient ambulatory to triage with steady gait, without difficulty or distress noted; pt reports BP 183/106 today with c/o dizziness and nausea x 3 days

## 2018-10-05 NOTE — ED Notes (Signed)
Updated on wait time.  

## 2018-10-05 NOTE — Progress Notes (Addendum)
Pain Management Virtual Encounter Note - Virtual Visit via Telephone Telehealth (real-time audio visits between healthcare provider and patient).   Patient's Phone No. & Preferred Pharmacy:  [phone] (home); [phone] (mobile); (Preferred) [phone] [email or web address]  [place], [name] - [address] [name] [address] Phone: [phone] Fax: [phone]    Pre-screening note:  Our staff contacted Ms. Lamastus and offered her an "in person", "face-to-face" appointment versus a telephone encounter. She indicated preferring the telephone encounter, at this time.   Reason for Virtual Visit: COVID-19*  Social distancing based on CDC and AMA recommendations.   I contacted Georga Kaufmann on 10/06/2018 via telephone.      I clearly identified myself as Gaspar Cola, MD. I verified that I was speaking with the correct person using two identifiers (Name: IZZABELLA CROWL, and date of birth: 24-Feb-1960).  Advanced Informed Consent I sought verbal advanced consent from Georga Kaufmann for virtual visit interactions. I informed Ms. Danzy of possible security and privacy concerns, risks, and limitations associated with providing "not-in-person" medical evaluation and management services. I also informed Ms. Murnane of the availability of "in-person" appointments. Finally, I informed her that there would be a charge for the virtual visit and that she could be  personally, fully or partially, financially responsible for it. Ms. Dorin expressed understanding and agreed to proceed.   Historic Elements   Charlene [name] is a 58 y.o. year old, female patient evaluated today after her last encounter by our practice on 09/22/2018. Ms. Tomasko  has a past medical history of Arthritis, Degenerative disc disease, lumbar, Diabetes mellitus without complication ([place]), GERD (gastroesophageal reflux disease), Hypertension, Neuropathy,  Polyneuropathy, and Polyneuropathy. She also  has a past surgical history that includes arthroscopic rotator cuff; Colonoscopy; Abdominal hysterectomy; endosopic sinus; and Cataract extraction w/PHACO (Right, 11/25/2017). Ms. Hanigan has a current medication list which includes the following prescription(s): amlodipine, baclofen, gnp calcium 1200, carvedilol, vitamin d3, dicyclomine, duloxetine, gabapentin, insulin glargine (2 unit dial), lidocaine-menthol, liraglutide, lisinopril, magnesium, meloxicam, metoprolol succinate, pantoprazole, polyethylene glycol, tramadol, and duloxetine, and the following Facility-Administered Medications: amlodipine, baclofen, calcium-vitamin d, carvedilol, cholecalciferol, dexamethasone, dicyclomine, duloxetine, gabapentin, insulin aspart, insulin aspart, insulin aspart, insulin glargine, insulin glargine, lisinopril, magnesium oxide, meloxicam, metoprolol succinate, pantoprazole, and polyethylene glycol. She  reports that she has never smoked. She has never used smokeless tobacco. She reports previous alcohol use. She reports that she does not use drugs. Ms. Sobalvarro has No Known Allergies.   HPI  Today, she is being contacted for a post-procedure assessment.  The patient indicates doing well with the current medication regimen. No adverse reactions or side effects reported to the medications.  As it turns out, she is currently hospitalized and being evaluated for severe hypertension and a possible intracranial mass.  As I was talking to her, she was waiting to have a CT scan of her head done.  I told the patient to give [place] a call, as soon as she gets better and knows little bit more about what is going on.  For the time being, we will keep her on the same medications and we will do PRN interventions as needed.  Post-Procedure Evaluation  Procedure: Palliative left-sided L4 transforaminal epidural steroid injection #4 + left-sided L4-5 interlaminar LESI #4 under fluoroscopic  guidance and IV sedation Pre-procedure pain level:  3/10 Post-procedure: 0/10 (100% relief)  Sedation: Sedation provided.  Effectiveness during initial hour after procedure(Ultra-Short Term Relief): 100 %  Local anesthetic used: Long-acting (4-6 hours) Effectiveness: Defined as any analgesic benefit obtained secondary to the administration of local anesthetics. This carries significant diagnostic value as to the etiological location, or anatomical origin, of the pain. Duration of benefit is expected to coincide with the duration of the local anesthetic used.  Effectiveness during initial 4-6 hours after procedure(Short-Term Relief): 100 %   Long-term benefit: Defined as any relief past the pharmacologic duration of the local anesthetics.  Effectiveness past the initial 6 hours after procedure(Long-Term Relief): 50 %   Current benefits: Defined as benefit that persist at this time.   Analgesia:  70% improved Function: Somewhat improved ROM: Somewhat improved  Pharmacotherapy Assessment  Analgesic: Tramadol 50 mg, 1 tab PO q 8 hrs (150 mg/day of tramadol)(enough to last until 02/16/2019) MME/day: 15mg /day.   Monitoring: Pharmacotherapy: No side-effects or adverse reactions reported. [place] PMP: PDMP reviewed during this encounter.       Compliance: No problems identified. Effectiveness: Clinically acceptable. Plan: Refer to "POC".  UDS:  Summary  Date Value Ref Range Status  10/12/2017 FINAL  Final    Comment:    ==================================================================== [facility] COMP DRUG ANALYSIS,UR ==================================================================== Test                             Result       Flag       Units Drug Present and Declared for Prescription Verification   Tramadol                       [id number]       EXPECTED   ng/mg creat   O-Desmethyltramadol            8266         EXPECTED   ng/mg creat   N-Desmethyltramadol            2066          EXPECTED   ng/mg creat    Source of tramadol is a prescription medication.    O-desmethyltramadol and N-desmethyltramadol are expected    metabolites of tramadol.   Gabapentin                     PRESENT      EXPECTED   Baclofen                       PRESENT      EXPECTED   Metoprolol                     PRESENT      EXPECTED Drug Present not Declared for Prescription Verification   Alcohol, Ethyl                 0.154        UNEXPECTED g/dL    Sources of ethyl alcohol include alcoholic beverages or as a    fermentation product of glucose; glucose is present in this    specimen.  Interpret result with caution, as the presence of    ethyl alcohol is likely due, at least in part, to fermentation of    glucose.   Naproxen                       PRESENT      UNEXPECTED Drug Absent but Declared for Prescription Verification   Tizanidine  Not Detected UNEXPECTED    Tizanidine, as indicated in the declared medication list, is not    always detected even when used as directed.   Duloxetine                     Not Detected UNEXPECTED   Lidocaine                      Not Detected UNEXPECTED    Lidocaine, as indicated in the declared medication list, is not    always detected even when used as directed. ==================================================================== Test                      Result    Flag   Units      Ref Range   Creatinine              47               mg/dL      >=20 ==================================================================== Declared Medications:  The flagging and interpretation on this report are based on the  following declared medications.  Unexpected results may arise from  inaccuracies in the declared medications.  **Note: The testing scope of this panel includes these medications:  Baclofen  Duloxetine  Gabapentin  Metoprolol  Tramadol  **Note: The testing scope of this panel does not include small to  moderate amounts of these  reported medications:  Lidocaine  Tizanidine  **Note: The testing scope of this panel does not include following  reported medications:  Fluticasone  Hydrochlorothiazide (Lisinopril-HCTZ)  Insulin  Lisinopril (Lisinopril-HCTZ)  Metformin  Omeprazole  Pantoprazole  Potassium ==================================================================== For clinical consultation, please call [phone]. ====================================================================    Laboratory Chemistry Profile (12 mo)  Renal: 10/12/2017: BUN/Creatinine Ratio 17 10/05/2018: BUN 7; Creatinine, Ser 0.53  Lab Results  Component Value Date   GFRAA >60 10/05/2018   GFRNONAA >60 10/05/2018   Hepatic: 10/05/2018: Albumin 4.2 Lab Results  Component Value Date   AST 21 10/05/2018   ALT 20 10/05/2018   Other: 10/12/2017: 25-Hydroxy, Vitamin D 17; 25-Hydroxy, Vitamin D-2 <1.0; 25-Hydroxy, Vitamin D-3 17; CRP 4; Sed Rate 12; Vitamin B-12 1,063 Note: Above Lab results reviewed.  Imaging  Last 90 days:  Ct Head Wo Contrast  Result Date: 10/06/2018 CLINICAL DATA:  Headache, dizziness EXAM: CT HEAD WITHOUT CONTRAST TECHNIQUE: Contiguous axial images were obtained from the base of the skull through the vertex without intravenous contrast. COMPARISON:  None. FINDINGS: Brain: Large partially calcified lesion appears to arise from the right middle cranial fossa with upward extension and mild regional vasogenic edema. Mild mass effect with local sulcal effacement and partial effacement of the frontal horn of the right lateral ventricle. A smaller live similar appearing 9 mm partially calcified lesion is also present along the right parietal convexity (2/9). No evidence of acute infarction, hemorrhage, hydrocephalus, or extra-axial collection. Vascular: No hyperdense vessel or unexpected calcification. Skull: No abnormal sclerosis of the adjacent calvaria. Hyperostosis frontalis interna. No scalp swelling or hematoma.  Sinuses/Orbits: Paranasal sinuses and mastoid air cells are predominantly clear. Included orbital structures are unremarkable. Other: None. IMPRESSION: Partially calcified extra-axial appearing mass arising from the right middle cranial fossa/sphenoid wing with some adjacent mass effect and regional edema. This is most likely a meningioma. Brain MR without and with contrast could be ordered to confirm. Additional smaller partially calcified lesion along the right parietal could reflect an additional meningioma. These results were called  by telephone at the time of interpretation on 10/06/2018 at 12:11 am to provider [facility] , who verbally acknowledged these results. Electronically Signed   By: Lovena Le M.D.   On: 10/06/2018 00:12   Mr Jeri Cos And Wo Contrast  Result Date: 10/06/2018 CLINICAL DATA:  58 year old female with headache and dizziness, partially calcified right middle cranial fossa mass on CT earlier tonight. EXAM: MRI HEAD WITHOUT AND WITH CONTRAST TECHNIQUE: Multiplanar, multiecho pulse sequences of the brain and surrounding structures were obtained without and with intravenous contrast. CONTRAST:  12mL GADAVIST GADOBUTROL 1 MMOL/ML IV SOLN COMPARISON:  Head CT 10/05/2018. FINDINGS: Study is intermittently degraded by motion artifact despite repeated imaging attempts. Brain: An extra-axial appearing mass arising cephalad from the right sphenoid wing demonstrates isointense precontrast T1 signal and enhances homogeneously encompassing 38 x 44 by 43 millimeters (AP by transverse by CC). The mass has mildly lobulated margins, and abuts the right ICA terminus as seen on series 27, image 10. The right M1 is elevated by the mass (series 23, image 21). The mass abuts but does not definitely invade the right cavernous sinus or pituitary. The mass also abuts the right orbital apex. There is associated mass effect on the right inferior frontal gyrus and right anterior temporal lobe with-T2 and  FLAIR hyperintensity within the temporal lobe compatible with vasogenic edema (series 21, image 11). No midline shift or ventriculomegaly. No restricted diffusion or evidence of acute infarction. No other abnormal intracranial enhancement or dural thickening identified. Basilar cisterns remain patent. Negative cervicomedullary junction. Outside of the right temporal lobe there is scattered cerebral white matter T2 and FLAIR hyperintensity, both periventricular and cortical. The configuration is nonspecific. No definite cortical encephalomalacia. No chronic blood products identified; mineralization of the mass is again evident. Vascular: Major intracranial vascular flow voids are grossly preserved. Skull and upper cervical spine: Visualized bone marrow signal is within normal limits. Negative visible cervical spine. Sinuses/Orbits: Postoperative changes to the right globe. Intraorbital soft tissues are otherwise negative. Paranasal sinuses and mastoids are stable and well pneumatized. Other: Grossly normal internal auditory structures. IMPRESSION: 1. Extra-axial 4.4 cm enhancing mass extending cephalad from the right sphenoid wing is most compatible with a Meningioma. Regional mass effect, including on the right MCA vessels, with mild vasogenic edema in the right temporal lobe. The mass abuts but does not appear to invade the right ICA terminus, the right cavernous sinus, and the right orbital apex. 2. No other intracranial mass or acute intracranial abnormality. 3. Moderate for age nonspecific cerebral white matter signal changes, most commonly due to chronic small vessel disease. Electronically Signed   By: Genevie Ann M.D.   On: 10/06/2018 02:28   Dg Pain Clinic C-arm 1-60 Min No Report  Result Date: 09/21/2018 Fluoro was used, but no Radiologist interpretation will be provided. Please refer to "NOTES" tab for provider progress note.  Dg Pain Clinic C-arm 1-60 Min No Report  Result Date: 08/12/2018 Fluoro was  used, but no Radiologist interpretation will be provided. Please refer to "NOTES" tab for provider progress note.   Assessment  The primary encounter diagnosis was Chronic pain syndrome. Diagnoses of Chronic foot pain (Primary Area of Pain) (Left), Chronic ankle pain (Primary Area of Pain) (Left), Chronic lower extremity pain (Secondary Area of Pain) (Bilateral) (L>R), Chronic low back pain (Tertiary Area of Pain) (Bilateral) (L>R) w/ sciatica (Left), and Chronic knee pain (Fourth Area of Pain) (Bilateral) (L>R) were also pertinent to this visit.  Plan of Care  I am having Glorie L. Miyagi maintain her pantoprazole, metoprolol succinate, DULoxetine, lisinopril, liraglutide, Insulin Glargine (2 Unit Dial), Lidocaine-Menthol (ICY HOT LIDOCAINE PLUS MENTHOL EX), polyethylene glycol, amLODipine, baclofen, GNP Calcium 1200, Vitamin D3, Magnesium, gabapentin, meloxicam, traMADol, dicyclomine, carvedilol, and DULoxetine.  Pharmacotherapy (Medications Ordered): No orders of the defined types were placed in this encounter.  Orders:  No orders of the defined types were placed in this encounter.  Follow-up plan:   Return for scheduled appointment.     Interventional management options:  Considering:   NOTE: UNCONTROLLED IDDM (NO STEROIDS)  Diagnostic left LSB Possible left-sided lumbar sympathetic RFA Diagnostic right L5 TFESI Diagnostic bilateral lumbar facet block Possible bilateral lumbar facet RFA Diagnostic bilateral intra-articular knee joint injection (w/ L.A. + steroids)  Diagnostic bilateral genicular NB Possible bilateral genicular nerve RFA   Palliative PRN treatment(s):   Palliative left L5 TFESI #4 + left L4-5 interlaminar LESI #4  Palliative left L5 TFESI #4 Palliative left L4-5 interlaminar LESI #4  Palliative left L5-S1 LESI #3  Palliative bilateral Hyalgan knee series S2N1 (last done on 02/25/2018)    Recent Visits Date Type Provider Dept  09/21/18 Procedure visit  Milinda Pointer, MD [facility]  09/20/18 Office Visit Milinda Pointer, MD [facility]  08/12/18 Procedure visit Milinda Pointer, MD [facility]  08/04/18 Office Visit Milinda Pointer, MD [facility]  Showing recent visits within past 90 days and meeting all other requirements   Today's Visits Date Type Provider Dept  10/06/18 Office Visit Milinda Pointer, MD [facility]  Showing today's visits and meeting all other requirements   Future Appointments No visits were found meeting these conditions.  Showing future appointments within next 90 days and meeting all other requirements   I discussed the assessment and treatment plan with the patient. The patient was provided an opportunity to ask questions and all were answered. The patient agreed with the plan and demonstrated an understanding of the instructions.  Patient advised to call back or seek an in-person evaluation if the symptoms or condition worsens.  Total duration of non-face-to-face encounter: 13 minutes.  Note by: Gaspar Cola, MD Date: 10/06/2018; Time: 3:58 PM  Note: This dictation was prepared with Dragon dictation. Any transcriptional errors that may result from this process are unintentional.  Disclaimer:  * Given the special circumstances of the COVID-19 pandemic, the federal government has announced that the Office for Civil Rights (OCR) will exercise its enforcement discretion and will not impose penalties on physicians using telehealth in the event of noncompliance with regulatory requirements under the [place] and [place] (HIPAA) in connection with the good faith provision of telehealth during the XX123456 national public health emergency. ([place])

## 2018-10-06 ENCOUNTER — Inpatient Hospital Stay: Payer: BLUE CROSS/BLUE SHIELD

## 2018-10-06 ENCOUNTER — Ambulatory Visit: Payer: BLUE CROSS/BLUE SHIELD

## 2018-10-06 ENCOUNTER — Emergency Department: Payer: BLUE CROSS/BLUE SHIELD

## 2018-10-06 ENCOUNTER — Encounter: Payer: Self-pay | Admitting: Pain Medicine

## 2018-10-06 ENCOUNTER — Inpatient Hospital Stay (HOSPITAL_BASED_OUTPATIENT_CLINIC_OR_DEPARTMENT_OTHER): Payer: BLUE CROSS/BLUE SHIELD | Admitting: Pain Medicine

## 2018-10-06 DIAGNOSIS — M25561 Pain in right knee: Secondary | ICD-10-CM

## 2018-10-06 DIAGNOSIS — Z794 Long term (current) use of insulin: Secondary | ICD-10-CM | POA: Diagnosis not present

## 2018-10-06 DIAGNOSIS — Z791 Long term (current) use of non-steroidal anti-inflammatories (NSAID): Secondary | ICD-10-CM | POA: Diagnosis not present

## 2018-10-06 DIAGNOSIS — G894 Chronic pain syndrome: Secondary | ICD-10-CM | POA: Diagnosis present

## 2018-10-06 DIAGNOSIS — M5442 Lumbago with sciatica, left side: Secondary | ICD-10-CM

## 2018-10-06 DIAGNOSIS — M5136 Other intervertebral disc degeneration, lumbar region: Secondary | ICD-10-CM | POA: Diagnosis present

## 2018-10-06 DIAGNOSIS — I10 Essential (primary) hypertension: Secondary | ICD-10-CM | POA: Diagnosis present

## 2018-10-06 DIAGNOSIS — Z811 Family history of alcohol abuse and dependence: Secondary | ICD-10-CM | POA: Diagnosis not present

## 2018-10-06 DIAGNOSIS — M25572 Pain in left ankle and joints of left foot: Secondary | ICD-10-CM

## 2018-10-06 DIAGNOSIS — M79605 Pain in left leg: Secondary | ICD-10-CM

## 2018-10-06 DIAGNOSIS — D329 Benign neoplasm of meninges, unspecified: Secondary | ICD-10-CM | POA: Diagnosis present

## 2018-10-06 DIAGNOSIS — D320 Benign neoplasm of cerebral meninges: Secondary | ICD-10-CM | POA: Diagnosis present

## 2018-10-06 DIAGNOSIS — Z20828 Contact with and (suspected) exposure to other viral communicable diseases: Secondary | ICD-10-CM | POA: Diagnosis present

## 2018-10-06 DIAGNOSIS — M25562 Pain in left knee: Secondary | ICD-10-CM

## 2018-10-06 DIAGNOSIS — M79604 Pain in right leg: Secondary | ICD-10-CM

## 2018-10-06 DIAGNOSIS — E1151 Type 2 diabetes mellitus with diabetic peripheral angiopathy without gangrene: Secondary | ICD-10-CM | POA: Diagnosis present

## 2018-10-06 DIAGNOSIS — M159 Polyosteoarthritis, unspecified: Secondary | ICD-10-CM | POA: Diagnosis present

## 2018-10-06 DIAGNOSIS — E1165 Type 2 diabetes mellitus with hyperglycemia: Secondary | ICD-10-CM | POA: Diagnosis present

## 2018-10-06 DIAGNOSIS — G936 Cerebral edema: Secondary | ICD-10-CM | POA: Diagnosis present

## 2018-10-06 DIAGNOSIS — M79672 Pain in left foot: Secondary | ICD-10-CM

## 2018-10-06 DIAGNOSIS — M549 Dorsalgia, unspecified: Secondary | ICD-10-CM | POA: Diagnosis present

## 2018-10-06 DIAGNOSIS — G8929 Other chronic pain: Secondary | ICD-10-CM

## 2018-10-06 DIAGNOSIS — K219 Gastro-esophageal reflux disease without esophagitis: Secondary | ICD-10-CM | POA: Diagnosis present

## 2018-10-06 DIAGNOSIS — Z803 Family history of malignant neoplasm of breast: Secondary | ICD-10-CM | POA: Diagnosis not present

## 2018-10-06 LAB — URINALYSIS, COMPLETE (UACMP) WITH MICROSCOPIC
Bacteria, UA: NONE SEEN
Bilirubin Urine: NEGATIVE
Glucose, UA: >=500 mg/dL — AB
Hgb urine dipstick: NEGATIVE
Ketones, ur: NEGATIVE mg/dL
Leukocytes,Ua: NEGATIVE
Nitrite: NEGATIVE
Protein, ur: NEGATIVE mg/dL
Specific Gravity, Urine: 1.028 (ref 1.005–1.030)
pH: 6 (ref 5.0–8.0)

## 2018-10-06 LAB — GLUCOSE, CAPILLARY
Glucose-Capillary: 261 mg/dL — ABNORMAL HIGH (ref 70–99)
Glucose-Capillary: 203 mg/dL — ABNORMAL HIGH (ref 70–99)
Glucose-Capillary: 373 mg/dL — ABNORMAL HIGH (ref 70–99)
Glucose-Capillary: 167 mg/dL — ABNORMAL HIGH (ref 70–99)
Glucose-Capillary: 346 mg/dL — ABNORMAL HIGH (ref 70–99)

## 2018-10-06 LAB — SARS CORONAVIRUS 2 (TAT 6-24 HRS): SARS Coronavirus 2: NEGATIVE

## 2018-10-06 MED ORDER — DEXAMETHASONE 0.5 MG PO TABS
1.0000 mg | ORAL_TABLET | Freq: Every day | ORAL | Status: DC
  Administered 2018-10-07: 1 mg via ORAL
  Filled 2018-10-06: qty 2

## 2018-10-06 MED ORDER — INSULIN GLARGINE 100 UNIT/ML ~~LOC~~ SOLN
40.0000 [IU] | Freq: Every morning | SUBCUTANEOUS | Status: DC
  Administered 2018-10-06: 40 [IU] via SUBCUTANEOUS
  Filled 2018-10-06 (×3): qty 0.4

## 2018-10-06 MED ORDER — GADOBUTROL 1 MMOL/ML IV SOLN
5.0000 mL | Freq: Once | INTRAVENOUS | Status: AC | PRN
  Administered 2018-10-06: 5 mL via INTRAVENOUS

## 2018-10-06 MED ORDER — MELOXICAM 7.5 MG PO TABS
15.0000 mg | ORAL_TABLET | Freq: Every day | ORAL | Status: DC
  Administered 2018-10-06 – 2018-10-07 (×2): 15 mg via ORAL
  Filled 2018-10-06 (×2): qty 2

## 2018-10-06 MED ORDER — PANTOPRAZOLE SODIUM 40 MG PO TBEC
40.0000 mg | DELAYED_RELEASE_TABLET | Freq: Two times a day (BID) | ORAL | Status: DC
  Administered 2018-10-06 – 2018-10-07 (×3): 40 mg via ORAL
  Filled 2018-10-06 (×3): qty 1

## 2018-10-06 MED ORDER — INSULIN ASPART 100 UNIT/ML ~~LOC~~ SOLN: 0.0000 [IU] | Freq: Three times a day (TID) | SUBCUTANEOUS | Status: DC

## 2018-10-06 MED ORDER — POLYETHYLENE GLYCOL 3350 17 G PO PACK: 17.0000 g | PACK | Freq: Every day | ORAL | Status: DC | PRN

## 2018-10-06 MED ORDER — INSULIN ASPART 100 UNIT/ML ~~LOC~~ SOLN
5.0000 [IU] | Freq: Three times a day (TID) | SUBCUTANEOUS | Status: DC
  Administered 2018-10-06: 5 [IU] via SUBCUTANEOUS
  Filled 2018-10-06: qty 1

## 2018-10-06 MED ORDER — INSULIN ASPART 100 UNIT/ML ~~LOC~~ SOLN
0.0000 [IU] | Freq: Three times a day (TID) | SUBCUTANEOUS | Status: DC
  Administered 2018-10-06: 17:00:00 3 [IU] via SUBCUTANEOUS
  Administered 2018-10-06: 15 [IU] via SUBCUTANEOUS
  Administered 2018-10-06: 5 [IU] via SUBCUTANEOUS
  Filled 2018-10-06 (×3): qty 1

## 2018-10-06 MED ORDER — ENOXAPARIN SODIUM 40 MG/0.4ML ~~LOC~~ SOLN: 40.0000 mg | SUBCUTANEOUS | Status: DC

## 2018-10-06 MED ORDER — DEXAMETHASONE 0.5 MG PO TABS
1.0000 mg | ORAL_TABLET | Freq: Two times a day (BID) | ORAL | Status: DC
  Filled 2018-10-06: qty 0.5
  Filled 2018-10-06: qty 2
  Filled 2018-10-06: qty 0.5
  Filled 2018-10-06 (×2): qty 2

## 2018-10-06 MED ORDER — DICYCLOMINE HCL 10 MG PO CAPS
10.0000 mg | ORAL_CAPSULE | Freq: Four times a day (QID) | ORAL | Status: DC
  Administered 2018-10-06 – 2018-10-07 (×6): 10 mg via ORAL
  Filled 2018-10-06 (×6): qty 1

## 2018-10-06 MED ORDER — LISINOPRIL 20 MG PO TABS
40.0000 mg | ORAL_TABLET | Freq: Every day | ORAL | Status: DC
  Administered 2018-10-06 – 2018-10-07 (×2): 40 mg via ORAL
  Filled 2018-10-06 (×3): qty 2

## 2018-10-06 MED ORDER — BACLOFEN 10 MG PO TABS
10.0000 mg | ORAL_TABLET | Freq: Three times a day (TID) | ORAL | Status: DC
  Administered 2018-10-06 – 2018-10-07 (×4): 10 mg via ORAL
  Filled 2018-10-06 (×6): qty 1

## 2018-10-06 MED ORDER — METOPROLOL SUCCINATE ER 25 MG PO TB24
25.0000 mg | ORAL_TABLET | Freq: Two times a day (BID) | ORAL | Status: DC
  Administered 2018-10-06 (×2): 25 mg via ORAL
  Filled 2018-10-06 (×3): qty 1

## 2018-10-06 MED ORDER — VITAMIN D 25 MCG (1000 UNIT) PO TABS
5000.0000 [IU] | ORAL_TABLET | Freq: Every day | ORAL | Status: DC
  Administered 2018-10-06 – 2018-10-07 (×2): 5000 [IU] via ORAL
  Filled 2018-10-06 (×2): qty 5

## 2018-10-06 MED ORDER — CARVEDILOL 25 MG PO TABS
25.0000 mg | ORAL_TABLET | Freq: Two times a day (BID) | ORAL | Status: DC
  Administered 2018-10-06 – 2018-10-07 (×3): 25 mg via ORAL
  Filled 2018-10-06 (×3): qty 1

## 2018-10-06 MED ORDER — AMLODIPINE BESYLATE 5 MG PO TABS
5.0000 mg | ORAL_TABLET | Freq: Every day | ORAL | Status: DC
  Administered 2018-10-07: 09:00:00 5 mg via ORAL
  Filled 2018-10-06: qty 1

## 2018-10-06 MED ORDER — INSULIN ASPART 100 UNIT/ML ~~LOC~~ SOLN
0.0000 [IU] | Freq: Every day | SUBCUTANEOUS | Status: DC
  Administered 2018-10-06: 4 [IU] via SUBCUTANEOUS
  Filled 2018-10-06 (×2): qty 1

## 2018-10-06 MED ORDER — DULOXETINE HCL 30 MG PO CPEP
30.0000 mg | ORAL_CAPSULE | Freq: Two times a day (BID) | ORAL | Status: DC
  Administered 2018-10-06 – 2018-10-07 (×3): 30 mg via ORAL
  Filled 2018-10-06 (×3): qty 1

## 2018-10-06 MED ORDER — MAGNESIUM OXIDE 400 (241.3 MG) MG PO TABS
400.0000 mg | ORAL_TABLET | Freq: Two times a day (BID) | ORAL | Status: DC
  Administered 2018-10-06 – 2018-10-07 (×3): 400 mg via ORAL
  Filled 2018-10-06 (×3): qty 1

## 2018-10-06 MED ORDER — ACETAMINOPHEN 325 MG PO TABS
650.0000 mg | ORAL_TABLET | Freq: Four times a day (QID) | ORAL | Status: DC | PRN
  Administered 2018-10-06: 650 mg via ORAL
  Filled 2018-10-06: qty 2

## 2018-10-06 MED ORDER — AMLODIPINE BESYLATE 5 MG PO TABS
2.5000 mg | ORAL_TABLET | Freq: Every day | ORAL | Status: DC
  Administered 2018-10-06: 2.5 mg via ORAL
  Filled 2018-10-06: qty 1

## 2018-10-06 MED ORDER — IOHEXOL 350 MG/ML SOLN
75.0000 mL | Freq: Once | INTRAVENOUS | Status: AC | PRN
  Administered 2018-10-06: 75 mL via INTRAVENOUS

## 2018-10-06 MED ORDER — LIRAGLUTIDE 18 MG/3ML ~~LOC~~ SOPN: 1.8000 mg | PEN_INJECTOR | Freq: Every day | SUBCUTANEOUS | Status: DC

## 2018-10-06 MED ORDER — INSULIN GLARGINE 100 UNIT/ML ~~LOC~~ SOLN
120.0000 [IU] | Freq: Every day | SUBCUTANEOUS | Status: DC
  Administered 2018-10-06: 120 [IU] via SUBCUTANEOUS
  Filled 2018-10-06 (×2): qty 1.2

## 2018-10-06 MED ORDER — GABAPENTIN 400 MG PO CAPS
800.0000 mg | ORAL_CAPSULE | Freq: Four times a day (QID) | ORAL | Status: DC
  Administered 2018-10-06 – 2018-10-07 (×6): 800 mg via ORAL
  Filled 2018-10-06 (×6): qty 2

## 2018-10-06 MED ORDER — DEXAMETHASONE SODIUM PHOSPHATE 4 MG/ML IJ SOLN
2.0000 mg | Freq: Two times a day (BID) | INTRAMUSCULAR | Status: DC
  Administered 2018-10-06 (×2): 2 mg via INTRAVENOUS
  Filled 2018-10-06: qty 1
  Filled 2018-10-06 (×2): qty 0.5

## 2018-10-06 MED ORDER — CALCIUM CARBONATE-VITAMIN D 500-200 MG-UNIT PO TABS
2.0000 | ORAL_TABLET | Freq: Every day | ORAL | Status: DC
  Administered 2018-10-06 – 2018-10-07 (×2): 2 via ORAL
  Filled 2018-10-06 (×2): qty 2

## 2018-10-06 NOTE — Progress Notes (Signed)
Inpatient Diabetes Program Recommendations  AACE/ADA: New Consensus Statement on Inpatient Glycemic Control (2015)  Target Ranges:  Prepandial:   less than 140 mg/dL      Peak postprandial:   less than 180 mg/dL (1-2 hours)      Critically ill patients:  140 - 180 mg/dL   Lab Results  Component Value Date   GLUCAP 373 (H) 10/06/2018    Review of Glycemic Control  Diabetes history: DM 2 Outpatient Diabetes medications: Toujeo 40 units qam, 120 units qpm, Victoza 1.8 Mg Daily  Current orders for Inpatient glycemic control: Lantus 40 units qam, 120 units qpm, Nov 0-15 + hs.  Inpatient Diabetes Program Recommendations:    Decadron 2 mg bid being tapered down to 1 mg Daily. Fasting 203 this am. Lunchtime glucose increased to 373.   May consider Novolog 5 units tid meal coverage while on steroids. (pt to eat at least 50% of meal)  Thanks,  Tama Headings RN, MSN, BC-ADM Inpatient Diabetes Coordinator Team Pager [phone] (8a-5p)

## 2018-10-06 NOTE — ED Notes (Signed)
ED TO INPATIENT HANDOFF REPORT  ED Nurse Name and Phone #:  Wells Guiles [phone]  S Name/Age/Gender Georga Kaufmann 58 y.o. female Room/Bed: ED13A/ED13A  Code Status   Code Status: Not on file  Home/SNF/Other Home Patient oriented to: self, place, time and situation Is this baseline? Yes   Triage Complete: Triage complete  Chief Complaint nausea  Triage Note Patient ambulatory to triage with steady gait, without difficulty or distress noted; pt reports BP 183/106 today with c/o dizziness and nausea x 3 days   Allergies No Known Allergies  Level of Care/Admitting Diagnosis ED Disposition    ED Disposition Condition Comment   Admit  The patient appears reasonably stabilized for admission considering the current resources, flow, and capabilities available in the ED at this time, and I doubt any other [facility] requiring further screening and/or treatment in the ED prior to admission is  present.       B Medical/Surgery History Past Medical History:  Diagnosis Date  . Arthritis    knees  . Degenerative disc disease, lumbar   . Diabetes mellitus without complication ([facility])   . GERD (gastroesophageal reflux disease)   . Hypertension   . Neuropathy    left lower leg  . Polyneuropathy   . Polyneuropathy    Past Surgical History:  Procedure Laterality Date  . ABDOMINAL HYSTERECTOMY    . arthroscopic rotator cuff    . CATARACT EXTRACTION W/PHACO Right 11/25/2017   Procedure: CATARACT EXTRACTION PHACO AND INTRAOCULAR LENS PLACEMENT ([place]) RIGHT DIABETIC;  Surgeon: Leandrew Koyanagi, MD;  Location: [facility];  Service: Ophthalmology;  Laterality: Right;  Diabetic - insulin and oral meds  . COLONOSCOPY    . endosopic sinus       A IV Location/Drains/Wounds Patient Lines/Drains/Airways Status   Active Line/Drains/Airways    Name:   Placement date:   Placement time:   Site:   Days:   Peripheral IV 08/12/18 Posterior;Right Wrist   08/12/18    1313    Wrist   55    Peripheral IV 10/05/18 Left Antecubital   10/05/18    2346    Antecubital   1   Epidural Catheter 11/05/17   11/05/17    0836     335   Epidural Catheter 12/22/17   12/22/17    1020     288   Epidural Catheter 02/25/18   02/25/18    0840     223   Epidural Catheter 03/23/18   03/23/18    0848     197   Epidural Catheter 08/12/18   08/12/18    1338     55   Epidural Catheter 09/21/18   09/21/18    1437     15   Incision (Closed) 11/25/17 Eye Right   11/25/17    0747     315          Intake/Output Last 24 hours  Intake/Output Summary (Last 24 hours) at 10/06/2018 0328 Last data filed at 10/06/2018 0100 Gross per 24 hour  Intake 1000 ml  Output -  Net 1000 ml    Labs/Imaging Results for orders placed or performed during the hospital encounter of 10/05/18 (from the past 48 hour(s))  Urinalysis, Complete w Microscopic     Status: Abnormal   Collection Time: 10/05/18  2:13 AM  Result Value Ref Range   Color, Urine STRAW (A) YELLOW   APPearance CLEAR (A) CLEAR   Specific Gravity, Urine 1.028 1.005 -  1.030   pH 6.0 5.0 - 8.0   Glucose, UA >=500 (A) NEGATIVE mg/dL   Hgb urine dipstick NEGATIVE NEGATIVE   Bilirubin Urine NEGATIVE NEGATIVE   Ketones, ur NEGATIVE NEGATIVE mg/dL   Protein, ur NEGATIVE NEGATIVE mg/dL   Nitrite NEGATIVE NEGATIVE   Leukocytes,Ua NEGATIVE NEGATIVE   RBC / HPF 0-5 0 - 5 RBC/hpf   WBC, UA 0-5 0 - 5 WBC/hpf   Bacteria, UA NONE SEEN NONE SEEN   Squamous Epithelial / LPF 0-5 0 - 5    Comment: Performed at [facility], [address] [place], [place] [address]  CBC with Differential     Status: Abnormal   Collection Time: 10/05/18  7:09 PM  Result Value Ref Range   WBC 10.8 (H) 4.0 - 10.5 K/uL   RBC 4.54 3.87 - 5.11 MIL/uL   Hemoglobin 12.7 12.0 - 15.0 g/dL   HCT 39.2 36.0 - 46.0 %   MCV 86.3 80.0 - 100.0 fL   MCH 28.0 26.0 - 34.0 pg   MCHC 32.4 30.0 - 36.0 g/dL   RDW 12.6 11.5 - 15.5 %   Platelets 317 150 - 400 K/uL   nRBC 0.0 0.0 - 0.2 %    Neutrophils Relative % 66 %   Neutro Abs 7.1 1.7 - 7.7 K/uL   Lymphocytes Relative 26 %   Lymphs Abs 2.8 0.7 - 4.0 K/uL   Monocytes Relative 6 %   Monocytes Absolute 0.6 0.1 - 1.0 K/uL   Eosinophils Relative 1 %   Eosinophils Absolute 0.1 0.0 - 0.5 K/uL   Basophils Relative 0 %   Basophils Absolute 0.0 0.0 - 0.1 K/uL   Immature Granulocytes 1 %   Abs Immature Granulocytes 0.06 0.00 - 0.07 K/uL    Comment: Performed at [facility], [address] [place], [place] [address]  Comprehensive metabolic panel     Status: Abnormal   Collection Time: 10/05/18  7:09 PM  Result Value Ref Range   Sodium 134 (L) 135 - 145 mmol/L   Potassium 3.9 3.5 - 5.1 mmol/L   Chloride 93 (L) 98 - 111 mmol/L   CO2 29 22 - 32 mmol/L   Glucose, Bld 435 (H) 70 - 99 mg/dL   BUN 7 6 - 20 mg/dL   Creatinine, Ser 0.53 0.44 - 1.00 mg/dL   Calcium 10.4 (H) 8.9 - 10.3 mg/dL   Total Protein 7.3 6.5 - 8.1 g/dL   Albumin 4.2 3.5 - 5.0 g/dL   AST 21 15 - 41 U/L   ALT 20 0 - 44 U/L   Alkaline Phosphatase 94 38 - 126 U/L   Total Bilirubin 0.6 0.3 - 1.2 mg/dL   GFR calc non Af Amer >60 >60 mL/min   GFR calc Af Amer >60 >60 mL/min   Anion gap 12 5 - 15    Comment: Performed at [facility], [place], [name] [address]  Troponin I (High Sensitivity)     Status: None   Collection Time: 10/05/18  7:09 PM  Result Value Ref Range   Troponin I (High Sensitivity) 5 <18 ng/L    Comment: (NOTE) Elevated high sensitivity troponin I (hsTnI) values and significant  changes across serial measurements may suggest ACS but many other  chronic and acute conditions are known to elevate hsTnI results.  Refer to the "Links" section for chest pain algorithms and additional  guidance. Performed at [facility], [place], [name]  [address]   Glucose, capillary     Status: Abnormal   Collection Time: 10/06/18  2:08 AM  Result Value Ref Range   Glucose-Capillary 261 (H)  70 - 99 mg/dL   Ct Head Wo Contrast  Result Date: 10/06/2018 CLINICAL DATA:  Headache, dizziness EXAM: CT HEAD WITHOUT CONTRAST TECHNIQUE: Contiguous axial images were obtained from the base of the skull through the vertex without intravenous contrast. COMPARISON:  None. FINDINGS: Brain: Large partially calcified lesion appears to arise from the right middle cranial fossa with upward extension and mild regional vasogenic edema. Mild mass effect with local sulcal effacement and partial effacement of the frontal horn of the right lateral ventricle. A smaller live similar appearing 9 mm partially calcified lesion is also present along the right parietal convexity (2/9). No evidence of acute infarction, hemorrhage, hydrocephalus, or extra-axial collection. Vascular: No hyperdense vessel or unexpected calcification. Skull: No abnormal sclerosis of the adjacent calvaria. Hyperostosis frontalis interna. No scalp swelling or hematoma. Sinuses/Orbits: Paranasal sinuses and mastoid air cells are predominantly clear. Included orbital structures are unremarkable. Other: None. IMPRESSION: Partially calcified extra-axial appearing mass arising from the right middle cranial fossa/sphenoid wing with some adjacent mass effect and regional edema. This is most likely a meningioma. Brain MR without and with contrast could be ordered to confirm. Additional smaller partially calcified lesion along the right parietal could reflect an additional meningioma. These results were called by telephone at the time of interpretation on 10/06/2018 at 12:11 am to provider [facility] , who verbally acknowledged these results. Electronically Signed   By: Lovena Le M.D.   On: 10/06/2018 00:12   Mr Jeri Cos And Wo Contrast  Result Date: 10/06/2018 CLINICAL DATA:  58 year old female with headache and dizziness, partially calcified right middle cranial fossa mass on CT earlier tonight. EXAM: MRI HEAD WITHOUT AND WITH CONTRAST TECHNIQUE:  Multiplanar, multiecho pulse sequences of the brain and surrounding structures were obtained without and with intravenous contrast. CONTRAST:  44mL GADAVIST GADOBUTROL 1 MMOL/ML IV SOLN COMPARISON:  Head CT 10/05/2018. FINDINGS: Study is intermittently degraded by motion artifact despite repeated imaging attempts. Brain: An extra-axial appearing mass arising cephalad from the right sphenoid wing demonstrates isointense precontrast T1 signal and enhances homogeneously encompassing 38 x 44 by 43 millimeters (AP by transverse by CC). The mass has mildly lobulated margins, and abuts the right ICA terminus as seen on series 27, image 10. The right M1 is elevated by the mass (series 23, image 21). The mass abuts but does not definitely invade the right cavernous sinus or pituitary. The mass also abuts the right orbital apex. There is associated mass effect on the right inferior frontal gyrus and right anterior temporal lobe with-T2 and FLAIR hyperintensity within the temporal lobe compatible with vasogenic edema (series 21, image 11). No midline shift or ventriculomegaly. No restricted diffusion or evidence of acute infarction. No other abnormal intracranial enhancement or dural thickening identified. Basilar cisterns remain patent. Negative cervicomedullary junction. Outside of the right temporal lobe there is scattered cerebral white matter T2 and FLAIR hyperintensity, both periventricular and cortical. The configuration is nonspecific. No definite cortical encephalomalacia. No chronic blood products identified; mineralization of the mass is again evident. Vascular: Major intracranial vascular flow voids are grossly preserved. Skull and upper cervical spine: Visualized bone marrow signal is within normal limits. Negative visible cervical spine. Sinuses/Orbits: Postoperative changes to the right globe. Intraorbital soft tissues are otherwise negative. Paranasal sinuses and mastoids are stable and well pneumatized. Other:  Grossly  normal internal auditory structures. IMPRESSION: 1. Extra-axial 4.4 cm enhancing mass extending cephalad from the right sphenoid wing is most compatible with a Meningioma. Regional mass effect, including on the right MCA vessels, with mild vasogenic edema in the right temporal lobe. The mass abuts but does not appear to invade the right ICA terminus, the right cavernous sinus, and the right orbital apex. 2. No other intracranial mass or acute intracranial abnormality. 3. Moderate for age nonspecific cerebral white matter signal changes, most commonly due to chronic small vessel disease. Electronically Signed   By: Genevie Ann M.D.   On: 10/06/2018 02:28    Pending Labs Unresulted Labs (From admission, onward)   None      Vitals/Pain Today's Vitals   10/05/18 2158 10/05/18 2341 10/06/18 0230 10/06/18 0300  BP: (!) 149/80 (!) 161/94 (!) 167/100 (!) 156/90  Pulse: 91  96 96  Resp: 18     Temp:      TempSrc:      SpO2: 100%  97% 97%  Weight:      Height:      PainSc:        Isolation Precautions No active isolations  Medications Medications  dexamethasone (DECADRON) injection 2 mg (2 mg Intravenous Given 10/06/18 0321)  sodium chloride 0.9 % bolus 1,000 mL (0 mLs Intravenous Stopped 10/06/18 0100)  carvedilol (COREG) tablet 25 mg (25 mg Oral Given 10/06/18 0027)  insulin glargine (LANTUS) injection 110 Units (110 Units Subcutaneous Given 10/06/18 0028)  pantoprazole (PROTONIX) EC tablet 40 mg (40 mg Oral Given 10/06/18 0027)  gadobutrol (GADAVIST) 1 MMOL/ML injection 5 mL (5 mLs Intravenous Contrast Given 10/06/18 0154)    Mobility Walks with cane  Low fall risk   Focused Assessments Neuro Assessment Handoff:          Neuro Assessment: Exceptions to WDL(states she has had some dizziness ) Neuro Checks:      Last Documented NIHSS Modified Score:   Has TPA been given? No If patient is a Neuro Trauma and patient is going to OR before floor call report to [place] nurse:  [phone] or [phone]     R Recommendations: See Admitting Provider Note  Report given to:   Additional Notes:

## 2018-10-06 NOTE — H&P (Signed)
[facility] NAME: Charlene [name]    MR#:  [id number]  DATE OF BIRTH:  1960-04-09  DATE OF ADMISSION:  10/05/2018  PRIMARY CARE PHYSICIAN: Tracie Harrier, MD   REQUESTING/REFERRING PHYSICIAN: Rudene Re, MD  CHIEF COMPLAINT:   Chief Complaint  Patient presents with   Dizziness    HISTORY OF PRESENT ILLNESS:  58 y.o. female with pertinent past medical history of uncontrolled diabetes mellitus, diabetic neuropathy, DDD, OA, hypertension, and GERD presenting to the ED with chief complaints of dizziness, nausea and persistent elevated blood pressure.  Patient report onset of symptoms in the past 3 days ago.  Prior to this, patient had difficulty with memory for the past 2 to 3 months.  She states she forgets easily with difficulty recalling and retaining recent information.  In the last 1 to 2 days she has developed episodes of dizziness associated with nausea without vomiting.  Denies seizure-like activity, focal weakness, visual loss or double vision, numbness of face, loss of sense of smell or other related focal neurologic deficit.  She however endorses feeling flushed in the face and decreased sense of taste.  On arrival to the ED, she was afebrile with blood pressure 180/94 mm Hg and pulse rate 81 beats/min. There were no focal neurological deficits; she was alert and oriented x4, and she did not demonstrate any memory deficits.  She will labs revealed slightly elevated WBC of 10.8, sodium 134, glucose 435.  Noncontrast CT was obtained which showed partially calcified extra-axial mass arising from the right middle cranial fossa/xiphoid at wing concerning for meningioma.  Follow-up brain MRI with and without contrast was obtained which showed right seen avoid waiting meningioma with regional mass-effect including on the right MCA with mild vasogenic edema in the right temporal lobe.  Given this finding neurosurgery was contacted  who recommends admit for further management and possible surgical intervention in the near future.  PAST MEDICAL HISTORY:   Past Medical History:  Diagnosis Date   Arthritis    knees   Degenerative disc disease, lumbar    Diabetes mellitus without complication (HCC)    GERD (gastroesophageal reflux disease)    Hypertension    Neuropathy    left lower leg   Polyneuropathy    Polyneuropathy     PAST SURGICAL HISTORY:   Past Surgical History:  Procedure Laterality Date   ABDOMINAL HYSTERECTOMY     arthroscopic rotator cuff     CATARACT EXTRACTION W/PHACO Right 11/25/2017   Procedure: CATARACT EXTRACTION PHACO AND INTRAOCULAR LENS PLACEMENT ([place] [facility]) RIGHT DIABETIC;  Surgeon: Leandrew Koyanagi, MD;  Location: [facility];  Service: Ophthalmology;  Laterality: Right;  Diabetic - insulin and oral meds   COLONOSCOPY     endosopic sinus      SOCIAL HISTORY:   Social History   Tobacco Use   Smoking status: Never Smoker   Smokeless tobacco: Never Used  Substance Use Topics   Alcohol use: Not Currently    Alcohol/week: 0.0 - 1.0 standard drinks    FAMILY HISTORY:   Family History  Problem Relation Age of Onset   Breast cancer Maternal Aunt    Cancer Father    Alcohol abuse Father     DRUG ALLERGIES:  No Known Allergies  REVIEW OF SYSTEMS:   Review of Systems  Constitutional: Negative for chills, fever, malaise/fatigue and weight loss.  HENT: Negative for congestion, hearing loss and sore throat.   Eyes: Negative for blurred  vision and double vision.  Respiratory: Negative for cough, shortness of breath and wheezing.   Cardiovascular: Negative for chest pain, palpitations, orthopnea and leg swelling.  Gastrointestinal: Positive for nausea. Negative for abdominal pain, diarrhea and vomiting.  Genitourinary: Negative for dysuria and urgency.  Musculoskeletal: Positive for back pain and neck pain. Negative for myalgias.  Skin: Negative for  rash.  Neurological: Positive for dizziness. Negative for sensory change, speech change, focal weakness and headaches.       Peripheral  neuropathy  Psychiatric/Behavioral: Positive for memory loss. Negative for depression.   MEDICATIONS AT HOME:   Prior to Admission medications   Medication Sig Start Date End Date Taking? Authorizing Provider  amLODipine (NORVASC) 2.5 MG tablet Take 2.5 mg by mouth daily.  02/03/18 02/03/19 Yes [provider]  baclofen (LIORESAL) 10 MG tablet Take 1 tablet (10 mg total) by mouth 3 (three) times daily. 08/20/18 02/16/19 Yes Milinda Pointer, MD  Calcium Carbonate-Vit D-Min (GNP CALCIUM 1200) 1200-1000 MG-UNIT CHEW Chew 1,200 mg by mouth daily with breakfast. Take in combination with vitamin D and magnesium. 08/20/18 02/16/19 Yes Milinda Pointer, MD  carvedilol (COREG) 25 MG tablet Take 25 mg by mouth 2 (two) times daily with a meal.  09/15/18  Yes [provider]  Cholecalciferol (VITAMIN D3) 125 MCG (5000 UT) CAPS Take 1 capsule (5,000 Units total) by mouth daily with breakfast. Take along with calcium and magnesium. 08/20/18 02/16/19 Yes Milinda Pointer, MD  dicyclomine (BENTYL) 10 MG capsule Take 10 mg by mouth 4 (four) times daily.   Yes [provider]  DULoxetine (CYMBALTA) 30 MG capsule TAKE 1 TABLET BY MOUTH TWICE DAILY 08/30/18  Yes [provider]  gabapentin (NEURONTIN) 800 MG tablet Take 1 tablet (800 mg total) by mouth 4 (four) times daily. 08/20/18 02/16/19 Yes Milinda Pointer, MD  Insulin Glargine, 2 Unit Dial, (TOUJEO MAX SOLOSTAR) 300 UNIT/ML SOPN Inject 140 Units into the skin daily. 40 units AM and 120 units HS   Yes [provider]  Lidocaine-Menthol (ICY HOT LIDOCAINE PLUS MENTHOL EX) Apply 1 application topically.   Yes [provider]  liraglutide (VICTOZA) 18 MG/3ML SOPN Inject 1.8 mg into the skin daily.   Yes [provider]  lisinopril (PRINIVIL,ZESTRIL) 40 MG tablet Take 40  mg by mouth daily.   Yes [provider]  Magnesium 500 MG CAPS Take 1 capsule (500 mg total) by mouth 2 (two) times daily at 8 am and 10 pm. 08/20/18 02/16/19 Yes Milinda Pointer, MD  meloxicam (MOBIC) 15 MG tablet Take 1 tablet (15 mg total) by mouth daily. 08/20/18 02/16/19 Yes Milinda Pointer, MD  metoprolol succinate (TOPROL-XL) 50 MG 24 hr tablet Take 25 mg by mouth 2 (two) times daily.    Yes [provider]  pantoprazole (PROTONIX) 40 MG tablet Take 40 mg by mouth 2 (two) times daily.   Yes [provider]  polyethylene glycol (MIRALAX / GLYCOLAX) packet Take 17 g by mouth daily. Mix one tablespoon with 8oz of your favorite juice or water every day until you are having soft formed stools. Then start taking once daily if you didn't have a stool the day before. Patient taking differently: Take 17 g by mouth daily as needed. Mix one tablespoon with 8oz of your favorite juice or water every day until you are having soft formed stools. Then start taking once daily if you didn't have a stool the day before. 12/17/17  Yes Merlyn Lot, MD  traMADol Veatrice Bourbon) 50  MG tablet Take 1 tablet (50 mg total) by mouth every 8 (eight) hours as needed for severe pain. 08/20/18 02/16/19 Yes Milinda Pointer, MD  DULoxetine (CYMBALTA) 30 MG capsule Take 30 mg by mouth daily.    [provider]      VITAL SIGNS:  Blood pressure (!) 156/90, pulse 96, temperature 98.9 F (37.2 C), temperature source Oral, resp. rate 18, height 4\' 11"  (1.499 m), weight 56.7 kg, SpO2 97 %.  PHYSICAL EXAMINATION:   Physical Exam  GENERAL:  58 y.o.-year-old patient lying in the bed with no acute distress.  EYES: Pupils equal, round, reactive to light and accommodation. No scleral icterus. Extraocular muscles intact.  HEENT: Head atraumatic, normocephalic. Oropharynx and nasopharynx clear.  NECK:  Supple, no jugular venous distention. No thyroid enlargement, no tenderness.  LUNGS: Normal  breath sounds bilaterally, no wheezing, rales,rhonchi or crepitation. No use of accessory muscles of respiration.  CARDIOVASCULAR: S1, S2 normal. No murmurs, rubs, or gallops.  ABDOMEN: Soft, nontender, nondistended. Bowel sounds present. No organomegaly or mass.  EXTREMITIES: No pedal edema, cyanosis, or clubbing. No rash or lesions. + pedal pulses MUSCULOSKELETAL: Normal bulk, and power was 5+ grip and elbow, knee, and ankle flexion and extension bilaterally.  NEUROLOGIC:Alert and oriented x 3. CN 2-12 intact. Sensation to light touch and cold stimuli decreased in lower extremity bilaterally. Finger to nose nl. Babinski is downgoing. DTR's (biceps, patellar, and achilles) 2+ and symmetric throughout. Gait not tested due to safety concern. PSYCHIATRIC: The patient is alert and oriented x 3.  SKIN: No obvious rash, lesion, or ulcer.   DATA REVIEWED:  LABORATORY PANEL:   CBC Recent Labs  Lab 10/05/18 1909  WBC 10.8*  HGB 12.7  HCT 39.2  PLT 317   ------------------------------------------------------------------------------------------------------------------  Chemistries  Recent Labs  Lab 10/05/18 1909  NA 134*  K 3.9  CL 93*  CO2 29  GLUCOSE 435*  BUN 7  CREATININE 0.53  CALCIUM 10.4*  AST 21  ALT 20  ALKPHOS 94  BILITOT 0.6   ------------------------------------------------------------------------------------------------------------------  Cardiac Enzymes No results for input(s): TROPONINI in the last 168 hours. ------------------------------------------------------------------------------------------------------------------  RADIOLOGY:  Ct Head Wo Contrast  Result Date: 10/06/2018 CLINICAL DATA:  Headache, dizziness EXAM: CT HEAD WITHOUT CONTRAST TECHNIQUE: Contiguous axial images were obtained from the base of the skull through the vertex without intravenous contrast. COMPARISON:  None. FINDINGS: Brain: Large partially calcified lesion appears to arise from the  right middle cranial fossa with upward extension and mild regional vasogenic edema. Mild mass effect with local sulcal effacement and partial effacement of the frontal horn of the right lateral ventricle. A smaller live similar appearing 9 mm partially calcified lesion is also present along the right parietal convexity (2/9). No evidence of acute infarction, hemorrhage, hydrocephalus, or extra-axial collection. Vascular: No hyperdense vessel or unexpected calcification. Skull: No abnormal sclerosis of the adjacent calvaria. Hyperostosis frontalis interna. No scalp swelling or hematoma. Sinuses/Orbits: Paranasal sinuses and mastoid air cells are predominantly clear. Included orbital structures are unremarkable. Other: None. IMPRESSION: Partially calcified extra-axial appearing mass arising from the right middle cranial fossa/sphenoid wing with some adjacent mass effect and regional edema. This is most likely a meningioma. Brain MR without and with contrast could be ordered to confirm. Additional smaller partially calcified lesion along the right parietal could reflect an additional meningioma. These results were called by telephone at the time of interpretation on 10/06/2018 at 12:11 am to provider [facility] , who verbally acknowledged these results. Electronically Signed  By: Lovena Le M.D.   On: 10/06/2018 00:12   Mr Jeri Cos And Wo Contrast  Result Date: 10/06/2018 CLINICAL DATA:  58 year old female with headache and dizziness, partially calcified right middle cranial fossa mass on CT earlier tonight. EXAM: MRI HEAD WITHOUT AND WITH CONTRAST TECHNIQUE: Multiplanar, multiecho pulse sequences of the brain and surrounding structures were obtained without and with intravenous contrast. CONTRAST:  37mL GADAVIST GADOBUTROL 1 MMOL/ML IV SOLN COMPARISON:  Head CT 10/05/2018. FINDINGS: Study is intermittently degraded by motion artifact despite repeated imaging attempts. Brain: An extra-axial appearing mass  arising cephalad from the right sphenoid wing demonstrates isointense precontrast T1 signal and enhances homogeneously encompassing 38 x 44 by 43 millimeters (AP by transverse by CC). The mass has mildly lobulated margins, and abuts the right ICA terminus as seen on series 27, image 10. The right M1 is elevated by the mass (series 23, image 21). The mass abuts but does not definitely invade the right cavernous sinus or pituitary. The mass also abuts the right orbital apex. There is associated mass effect on the right inferior frontal gyrus and right anterior temporal lobe with-T2 and FLAIR hyperintensity within the temporal lobe compatible with vasogenic edema (series 21, image 11). No midline shift or ventriculomegaly. No restricted diffusion or evidence of acute infarction. No other abnormal intracranial enhancement or dural thickening identified. Basilar cisterns remain patent. Negative cervicomedullary junction. Outside of the right temporal lobe there is scattered cerebral white matter T2 and FLAIR hyperintensity, both periventricular and cortical. The configuration is nonspecific. No definite cortical encephalomalacia. No chronic blood products identified; mineralization of the mass is again evident. Vascular: Major intracranial vascular flow voids are grossly preserved. Skull and upper cervical spine: Visualized bone marrow signal is within normal limits. Negative visible cervical spine. Sinuses/Orbits: Postoperative changes to the right globe. Intraorbital soft tissues are otherwise negative. Paranasal sinuses and mastoids are stable and well pneumatized. Other: Grossly normal internal auditory structures. IMPRESSION: 1. Extra-axial 4.4 cm enhancing mass extending cephalad from the right sphenoid wing is most compatible with a Meningioma. Regional mass effect, including on the right MCA vessels, with mild vasogenic edema in the right temporal lobe. The mass abuts but does not appear to invade the right ICA  terminus, the right cavernous sinus, and the right orbital apex. 2. No other intracranial mass or acute intracranial abnormality. 3. Moderate for age nonspecific cerebral white matter signal changes, most commonly due to chronic small vessel disease. Electronically Signed   By: Genevie Ann M.D.   On: 10/06/2018 02:28    EKG:  EKG: normal EKG, normal sinus rhythm, unchanged from previous tracings. Vent. rate 80 BPM PR interval 134 ms QRS duration 74 ms QT/QTc 376/433 ms P-R-T axes 57 3 83 IMPRESSION AND PLAN:   58 y.o. female with pertinent past medical history of uncontrolled diabetes mellitus, diabetic neuropathy, DDD, OA, hypertension, and GERD presenting to the ED with chief complaints of dizziness, nausea and persistent elevated blood pressure.  1. Right sphenoid wing meningioma -presenting with dizziness, short-term memory loss, nausea and persistent elevated BP - Admit to MedSurg unit - MRI brain shows right C4 narrowing meningioma with regional mass-effect including on the right MCA vessels with mild vasogenic edema in the right temporal lobe. - Frequent neuro checks - Dexamethasone 10 mg IV twice daily - Findings discussed with Dr. Lacinda Axon, recommends to admit and start steroid will evaluate in the a.m. - Neurosurgery consult placed.  Message sent via haiku to Dr. Cari Caraway  2.  Diabetes Mellitus Type 2 with complications - Recent [id number] 8/269/20 was 15.8. Goal < 7.0 - Continue insulin glargine - CBG monitoring - SSI - DM education and close PCP follow up  3. HTN  + Goal BP <130/80 -Continue metoprolol, amlodipine, Coreg and lisinopril  4. GERD -continue Protonix  5. Diabetic polyneuropathy - Continue Cymbalta and gabapentin  6. DVT prophylaxis - Hold anti-coagulation for  pending procedure / brain lesions    All the records are reviewed and case discussed with ED provider. Management plans discussed with the patient, family and they are in agreement.  CODE STATUS:  FULL  TOTAL TIME TAKING CARE OF THIS PATIENT: 50 minutes.    on 10/06/2018 at 4:46 AM   Rufina Falco, DNP, FNP-BC [facility] Nurse Practitioner Between 7am to 6pm - Pager [phone]  After 6pm go to [email or web address] - password [facility]  Office  [phone]  CC: Primary care physician; Tracie Harrier, MD

## 2018-10-06 NOTE — ED Notes (Signed)
Patient's daughter called and will call back when patient is back in the room

## 2018-10-06 NOTE — Progress Notes (Signed)
[place] [place] NAME: Charlene [name]    MR#:  [id number]  DATE OF BIRTH:  19-Oct-1960  SUBJECTIVE:  patient came in with symptoms of memory loss, elevated blood pressure, dizziness just overall not feeling well. She was found to have meningioma. Sugars are up on the higher side. Feels better than yesterday. REVIEW OF SYSTEMS:   Review of Systems  Constitutional: Negative for chills, fever and weight loss.  HENT: Negative for ear discharge, ear pain and nosebleeds.   Eyes: Negative for blurred vision, pain and discharge.  Respiratory: Negative for sputum production, shortness of breath, wheezing and stridor.   Cardiovascular: Negative for chest pain, palpitations, orthopnea and PND.  Gastrointestinal: Negative for abdominal pain, diarrhea, nausea and vomiting.  Genitourinary: Negative for frequency and urgency.  Musculoskeletal: Negative for back pain and joint pain.  Neurological: Positive for weakness. Negative for sensory change, speech change and focal weakness.  Psychiatric/Behavioral: Negative for depression and hallucinations. The patient is not nervous/anxious.    Tolerating Diet:yes Tolerating PT: not needed  DRUG ALLERGIES:  No Known Allergies  VITALS:  Blood pressure (!) 164/86, pulse 89, temperature 98.3 F (36.8 C), temperature source Oral, resp. rate 18, height 4\' 11"  (1.499 m), weight 56.7 kg, SpO2 100 %.  PHYSICAL EXAMINATION:   Physical Exam  GENERAL:  58 y.o.-year-old patient lying in the bed with no acute distress.  EYES: Pupils equal, round, reactive to light and accommodation. No scleral icterus. Extraocular muscles intact.  HEENT: Head atraumatic, normocephalic. Oropharynx and nasopharynx clear.  NECK:  Supple, no jugular venous distention. No thyroid enlargement, no tenderness.  LUNGS: Normal breath sounds bilaterally, no wheezing, rales, rhonchi. No use of accessory muscles of respiration.   CARDIOVASCULAR: S1, S2 normal. No murmurs, rubs, or gallops.  ABDOMEN: Soft, nontender, nondistended. Bowel sounds present. No organomegaly or mass.  EXTREMITIES: No cyanosis, clubbing or edema b/l.    NEUROLOGIC: Cranial nerves II through XII are intact. No focal Motor or sensory deficits b/l.   PSYCHIATRIC:  patient is alert and oriented x 3.  SKIN: No obvious rash, lesion, or ulcer.   LABORATORY PANEL:  CBC Recent Labs  Lab 10/05/18 1909  WBC 10.8*  HGB 12.7  HCT 39.2  PLT 317    Chemistries  Recent Labs  Lab 10/05/18 1909  NA 134*  K 3.9  CL 93*  CO2 29  GLUCOSE 435*  BUN 7  CREATININE 0.53  CALCIUM 10.4*  AST 21  ALT 20  ALKPHOS 94  BILITOT 0.6   Cardiac Enzymes No results for input(s): TROPONINI in the last 168 hours. RADIOLOGY:  Ct Head Wo Contrast  Result Date: 10/06/2018 CLINICAL DATA:  Headache, dizziness EXAM: CT HEAD WITHOUT CONTRAST TECHNIQUE: Contiguous axial images were obtained from the base of the skull through the vertex without intravenous contrast. COMPARISON:  None. FINDINGS: Brain: Large partially calcified lesion appears to arise from the right middle cranial fossa with upward extension and mild regional vasogenic edema. Mild mass effect with local sulcal effacement and partial effacement of the frontal horn of the right lateral ventricle. A smaller live similar appearing 9 mm partially calcified lesion is also present along the right parietal convexity (2/9). No evidence of acute infarction, hemorrhage, hydrocephalus, or extra-axial collection. Vascular: No hyperdense vessel or unexpected calcification. Skull: No abnormal sclerosis of the adjacent calvaria. Hyperostosis frontalis interna. No scalp swelling or hematoma. Sinuses/Orbits: Paranasal sinuses and mastoid air cells are predominantly clear. Included orbital structures  are unremarkable. Other: None. IMPRESSION: Partially calcified extra-axial appearing mass arising from the right middle cranial  fossa/sphenoid wing with some adjacent mass effect and regional edema. This is most likely a meningioma. Brain MR without and with contrast could be ordered to confirm. Additional smaller partially calcified lesion along the right parietal could reflect an additional meningioma. These results were called by telephone at the time of interpretation on 10/06/2018 at 12:11 am to provider [facility] , who verbally acknowledged these results. Electronically Signed   By: Lovena Le M.D.   On: 10/06/2018 00:12   Mr Jeri Cos And Wo Contrast  Result Date: 10/06/2018 CLINICAL DATA:  58 year old female with headache and dizziness, partially calcified right middle cranial fossa mass on CT earlier tonight. EXAM: MRI HEAD WITHOUT AND WITH CONTRAST TECHNIQUE: Multiplanar, multiecho pulse sequences of the brain and surrounding structures were obtained without and with intravenous contrast. CONTRAST:  79mL GADAVIST GADOBUTROL 1 MMOL/ML IV SOLN COMPARISON:  Head CT 10/05/2018. FINDINGS: Study is intermittently degraded by motion artifact despite repeated imaging attempts. Brain: An extra-axial appearing mass arising cephalad from the right sphenoid wing demonstrates isointense precontrast T1 signal and enhances homogeneously encompassing 38 x 44 by 43 millimeters (AP by transverse by CC). The mass has mildly lobulated margins, and abuts the right ICA terminus as seen on series 27, image 10. The right M1 is elevated by the mass (series 23, image 21). The mass abuts but does not definitely invade the right cavernous sinus or pituitary. The mass also abuts the right orbital apex. There is associated mass effect on the right inferior frontal gyrus and right anterior temporal lobe with-T2 and FLAIR hyperintensity within the temporal lobe compatible with vasogenic edema (series 21, image 11). No midline shift or ventriculomegaly. No restricted diffusion or evidence of acute infarction. No other abnormal intracranial enhancement or  dural thickening identified. Basilar cisterns remain patent. Negative cervicomedullary junction. Outside of the right temporal lobe there is scattered cerebral white matter T2 and FLAIR hyperintensity, both periventricular and cortical. The configuration is nonspecific. No definite cortical encephalomalacia. No chronic blood products identified; mineralization of the mass is again evident. Vascular: Major intracranial vascular flow voids are grossly preserved. Skull and upper cervical spine: Visualized bone marrow signal is within normal limits. Negative visible cervical spine. Sinuses/Orbits: Postoperative changes to the right globe. Intraorbital soft tissues are otherwise negative. Paranasal sinuses and mastoids are stable and well pneumatized. Other: Grossly normal internal auditory structures. IMPRESSION: 1. Extra-axial 4.4 cm enhancing mass extending cephalad from the right sphenoid wing is most compatible with a Meningioma. Regional mass effect, including on the right MCA vessels, with mild vasogenic edema in the right temporal lobe. The mass abuts but does not appear to invade the right ICA terminus, the right cavernous sinus, and the right orbital apex. 2. No other intracranial mass or acute intracranial abnormality. 3. Moderate for age nonspecific cerebral white matter signal changes, most commonly due to chronic small vessel disease. Electronically Signed   By: Genevie Ann M.D.   On: 10/06/2018 02:28   ASSESSMENT AND PLAN:  58 y.o. female with pertinent past medical history of uncontrolled diabetes mellitus, diabetic neuropathy, DDD, OA, hypertension, and GERD presenting to the ED with chief complaints of dizziness, nausea and persistent elevated blood pressure.  1.Right sphenoid wing meningioma -presenting with dizziness, short-term memory loss, nausea and persistent elevated BP - MRI brain shows right C4 narrowing meningioma with regional mass-effect including on the right MCA vessels with mild  vasogenic  edema in the right temporal lobe. - Frequent neuro checks - Dexamethasone 10 mg IV twice daily--taper it off per Dr Cari Caraway -CT head and neck angiogram ordered by neurosurgery. They will plan surgery at a later date.  2. Diabetes Mellitus Type 2 with complications-- uncontrolled - Recent HgbA1c 8/269/20 was 15.8. Goal < 7.0 - Continue insulin glargine-- hundred 20 units at bedtime and 40 units daily. -Patient has dietary noncompliance. Discussed with her importance of tight sugar control for upcoming surgery and overall well-being -diabetes coordinator consult placed - CBG monitoring - SSI - DM education and close PCP follow up -patient used to see endocrinology she will benefit outpatient endocrinology after discharge for tight sugar control. Will defer to PCP Dr. Ginette Pitman to arrange this.  3. HTN  + Goal BP <130/80 -Continue metoprolol, amlodipine, Coreg and lisinopril  4. GERD -continue Protonix  5. Diabetic polyneuropathy - Continue Cymbalta and gabapentin  6. DVT prophylaxis - Hold anti-coagulation for  pending procedure / brain lesions    Case discussed with Care Management/Social Worker. Management plans discussed with the patient  and they are in agreement.  CODE STATUS: full  DVT Prophylaxis: scd/ambulation  TOTAL TIME TAKING CARE OF THIS PATIENT: *30* minutes.  >50% time spent on counselling and coordination of care  POSSIBLE D/C IN *1-2      * DAYS, DEPENDING ON CLINICAL CONDITION.  Note: This dictation was prepared with Dragon dictation along with smaller phrase technology. Any transcriptional errors that result from this process are unintentional.  Fritzi Mandes M.D on 10/06/2018 at 11:34 AM  Between 7am to 6pm - Pager - [phone]  After 6pm go to [email or web address] - password [email or web address] [facility]  Office  [phone]  CC: Primary care physician; Tracie Harrier, MDPatient ID: Georga Kaufmann, female   DOB: 18-Sep-1960, 58  y.o.   MRN: [id number]

## 2018-10-06 NOTE — Plan of Care (Signed)

## 2018-10-06 NOTE — ED Notes (Signed)
Charlene [name] (daughter) [phone]

## 2018-10-06 NOTE — Consult Note (Signed)
Referring Physician:  No referring provider defined for this encounter.  Primary Physician:  Tracie Harrier, MD  Chief Complaint:  New brain tumor  History of Present Illness: 10/06/2018 Charlene [name] is a 58 y.o. female who presents with the chief complaint of new brain mass seen on MRI.  She presented with dizziness and feeling like she might pass out.  She was found to have hyperglycemia, and had a head CT that showed a new intracranial mass.  She is currently asymptomatic without headache, nausea, vomiting, or signs or symptoms of seizure.  She did report some memory issues, but these appear longstanding.    Of note, she has longstanding poorly controlled diabetes.  Review of Systems:  A 10 point review of systems is negative, except for the pertinent positives and negatives detailed in the HPI.  Past Medical History: Past Medical History:  Diagnosis Date  . Arthritis    knees  . Degenerative disc disease, lumbar   . Diabetes mellitus without complication ([place])   . GERD (gastroesophageal reflux disease)   . Hypertension   . Neuropathy    left lower leg  . Polyneuropathy   . Polyneuropathy     Past Surgical History: Past Surgical History:  Procedure Laterality Date  . ABDOMINAL HYSTERECTOMY    . arthroscopic rotator cuff    . CATARACT EXTRACTION W/PHACO Right 11/25/2017   Procedure: CATARACT EXTRACTION PHACO AND INTRAOCULAR LENS PLACEMENT ([employer]) RIGHT DIABETIC;  Surgeon: Leandrew Koyanagi, MD;  Location: [facility];  Service: Ophthalmology;  Laterality: Right;  Diabetic - insulin and oral meds  . COLONOSCOPY    . endosopic sinus      Allergies: Allergies as of 10/05/2018  . (No Known Allergies)    Medications:  Current Facility-Administered Medications:  .  amLODipine (NORVASC) tablet 2.5 mg, 2.5 mg, Oral, Daily, Ouma, Bing Neighbors, NP, 2.5 mg at 10/06/18 0816 .  baclofen (LIORESAL) tablet 10 mg, 10 mg, Oral, TID, Ouma, Bing Neighbors, NP, 10 mg at 10/06/18 0818 .  calcium-vitamin D (OSCAL WITH D) 500-200 MG-UNIT per tablet 2 tablet, 2 tablet, Oral, Q breakfast, Ouma, Bing Neighbors, NP, 2 tablet at 10/06/18 0817 .  carvedilol (COREG) tablet 25 mg, 25 mg, Oral, BID WC, Ouma, Bing Neighbors, NP, 25 mg at 10/06/18 0817 .  cholecalciferol (VITAMIN D3) tablet 5,000 Units, 5,000 Units, Oral, Q breakfast, Ouma, Bing Neighbors, NP, 5,000 Units at 10/06/18 0815 .  dexamethasone (DECADRON) injection 2 mg, 2 mg, Intravenous, BID, Alfred Levins, [place], MD, 2 mg at 10/06/18 0819 .  dicyclomine (BENTYL) capsule 10 mg, 10 mg, Oral, QID, Ouma, Bing Neighbors, NP, 10 mg at 10/06/18 0817 .  DULoxetine (CYMBALTA) DR capsule 30 mg, 30 mg, Oral, BID, Ouma, Bing Neighbors, NP, 30 mg at 10/06/18 0818 .  gabapentin (NEURONTIN) capsule 800 mg, 800 mg, Oral, QID, Ouma, Bing Neighbors, NP, 800 mg at 10/06/18 0816 .  insulin aspart (novoLOG) injection 0-15 Units, 0-15 Units, Subcutaneous, TID WC, Fritzi Mandes, MD, 5 Units at 10/06/18 0817 .  insulin aspart (novoLOG) injection 0-5 Units, 0-5 Units, Subcutaneous, QHS, Ouma, Elizabeth Achieng, NP .  insulin glargine (LANTUS) injection 120 Units, 120 Units, Subcutaneous, QHS, Ouma, Elizabeth Achieng, NP .  insulin glargine (LANTUS) injection 40 Units, 40 Units, Subcutaneous, q morning - 10a, Lang Snow, NP, 40 Units at 10/06/18 0819 .  lisinopril (ZESTRIL) tablet 40 mg, 40 mg, Oral, Daily, Ouma, Bing Neighbors, NP, 40 mg at 10/06/18 0817 .  magnesium oxide (MAG-OX) tablet 400  mg, 400 mg, Oral, BID AC & HS, Ouma, Bing Neighbors, NP, 400 mg at 10/06/18 0824 .  meloxicam (MOBIC) tablet 15 mg, 15 mg, Oral, Daily, Ouma, Bing Neighbors, NP, 15 mg at 10/06/18 0816 .  metoprolol succinate (TOPROL-XL) 24 hr tablet 25 mg, 25 mg, Oral, BID, Ouma, Bing Neighbors, NP, 25 mg at 10/06/18 0815 .  pantoprazole (PROTONIX) EC tablet 40 mg, 40 mg, Oral, BID, Ouma, Bing Neighbors,  NP, 40 mg at 10/06/18 0815 .  polyethylene glycol (MIRALAX / GLYCOLAX) packet 17 g, 17 g, Oral, Daily PRN, Ouma, Bing Neighbors, NP   Social History: Social History   Tobacco Use  . Smoking status: Never Smoker  . Smokeless tobacco: Never Used  Substance Use Topics  . Alcohol use: Not Currently    Alcohol/week: 0.0 - 1.0 standard drinks  . Drug use: No    Family Medical History: Family History  Problem Relation Age of Onset  . Breast cancer Maternal Aunt   . Cancer Father   . Alcohol abuse Father     Physical Examination: Vitals:   10/06/18 0548 10/06/18 0725  BP: (!) 171/89 (!) 164/86  Pulse: 83 89  Resp:  18  Temp:  98.3 F (36.8 C)  SpO2:  100%     General: Patient is well developed, well nourished, calm, collected, and in no apparent distress.  Psychiatric: Patient is non-anxious.  Head:  Pupils equal, round, and reactive to light.  ENT:  Oral mucosa appears well hydrated.  Neck:   Supple.  Full range of motion.  Respiratory: Patient is breathing without any difficulty.  Extremities: No edema.  Vascular: Palpable pulses in dorsal pedal vessels.  Skin:   On exposed skin, there are no abnormal skin lesions.  NEUROLOGICAL:  General: In no acute distress.   Awake, alert, oriented to person, place, and time.  Pupils equal round and reactive to light.  Facial tone is symmetric.  Tongue protrusion is midline.  There is no pronator drift.  Speech C and F.  Able to name and repeat without issues.  Strength: Side Biceps Triceps Deltoid Interossei Grip Wrist Ext. Wrist Flex.  R 5 5 5 5 5 5 5   L 5 5 5 5 5 5 5    Side Iliopsoas Quads Hamstring PF DF EHL  R 5 5 5 5 5 5   L 5 5 5 5 5 5    Reflexes are 2+ and symmetric at the biceps, triceps, brachioradialis, 1+ in patella and achilles.   Bilateral upper and lower extremity sensation is intact to light touch and pin prick.  Gait is untested. Hoffman's is absent.  Imaging: MRI brain  10/06/2018 IMPRESSION: 1. Extra-axial 4.4 cm enhancing mass extending cephalad from the right sphenoid wing is most compatible with a Meningioma. Regional mass effect, including on the right MCA vessels, with mild vasogenic edema in the right temporal lobe. The mass abuts but does not appear to invade the right ICA terminus, the right cavernous sinus, and the right orbital apex. 2. No other intracranial mass or acute intracranial abnormality. 3. Moderate for age nonspecific cerebral white matter signal changes, most commonly due to chronic small vessel disease.   Electronically Signed   By: Genevie Ann M.D.   On: 10/06/2018 02:28  I have personally reviewed the images and agree with the above interpretation.  Assessment and Plan: Ms. Staples is a pleasant 58 y.o. female with newly diagnosed R sided mass. This appears most consistent with a sphenoid wing meningioma.  It does appear to impact the R MCA and other important vessels.  I would recommend a CTA of the head and neck.  OK to decrease and wean off the dexamethasone.  When stable from medical issues with diabetes, stable to discharge home from neurosurgery perspective.  I will arrange outpatient follow up.    I stressed the importance of appropriate diabetes management.  She may need surgery at some point, and it will impact her wound healing.  Sayan Aldava K. Izora Ribas MD, [place] [facility]

## 2018-10-06 NOTE — ED Provider Notes (Signed)
[facility] Emergency Department Provider Note  ____________________________________________  Time seen: Approximately 12:04 AM  I have reviewed the triage vital signs and the nursing notes.   HISTORY  Chief Complaint Dizziness   HPI Charlene [name] is a 58 y.o. female with a history of hypertension, diabetes, GERD, chronic back pain who presents for evaluation of dizziness. Patient reports feeling lightheaded today with sensations of feeling like she was going to pass out.  Also had some nausea which prompted her to go to the fire department.  Over there she was found to have elevated blood pressure.  She went to urgent care and was sent to the emergency room for evaluation.  She denies headache, chest pain, shortness of breath, fever.  She does endorse increased urinary frequency, mild diarrhea and nausea for the last several days.  She has not taken her evening insulin or blood pressure medicine.  She was recently started on Coreg on top of amlodipine and lisinopril for uncontrolled high blood pressure.  She has been eating and drinking normally.    Past Medical History:  Diagnosis Date   Arthritis    knees   Degenerative disc disease, lumbar    Diabetes mellitus without complication (HCC)    GERD (gastroesophageal reflux disease)    Hypertension    Neuropathy    left lower leg   Polyneuropathy    Polyneuropathy     Patient Active Problem List   Diagnosis Date Noted   Osteoarthritis involving multiple joints 08/04/2018   Type 2 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, with long-term current use of insulin ([place]) 03/22/2018   Abnormal EMG (07/06/2017) 11/23/2017   Long-term insulin use ([place]) 11/10/2017   Uncontrolled type 2 diabetes mellitus with hyperglycemia ([place]) 11/10/2017   Vitamin D deficiency 10/21/2017   DM type 2 with diabetic peripheral neuropathy ([employer]) 10/21/2017     DDD (degenerative disc disease), lumbar 10/21/2017   Lumbar facet arthropathy 10/21/2017   Lumbar facet syndrome (Bilateral) 10/21/2017   Lumbar facet hypertrophy 10/21/2017   Lumbar foraminal stenosis (L5-S1) (Right) 10/21/2017   Osteoarthritis of knee (Bilateral) 10/21/2017   Chronic musculoskeletal pain 10/21/2017   Essential (primary) hypertension 10/12/2017   Chronic ankle pain (Primary Area of Pain) (Left) 10/12/2017   Chronic foot pain (Primary Area of Pain) (Left) 10/12/2017   Chronic lower extremity pain (Secondary Area of Pain) (Bilateral) (L>R) 10/12/2017   Chronic knee pain (Fourth Area of Pain) (Bilateral) (L>R) 10/12/2017   Chronic low back pain [facility] Area of Pain) (Bilateral) (L>R) w/ sciatica (Left) 10/12/2017   Chronic pain syndrome 10/12/2017   Opiate use 10/12/2017   Pharmacologic therapy 10/12/2017   Disorder of skeletal system 10/12/2017   Problems influencing health status 10/12/2017   Neuropathy 08/11/2017   Burning sensation of feet 07/06/2017   Lower extremity numbness and tingling (Left) 07/06/2017   Polyneuropathy associated with underlying disease ([place]) 05/29/2017   DM type 2, uncontrolled, with neuropathy ([place]) 05/05/2017   Neuropathic pain 06/29/2015   GERD without esophagitis 01/16/2014    Past Surgical History:  Procedure Laterality Date   ABDOMINAL HYSTERECTOMY     arthroscopic rotator cuff     CATARACT EXTRACTION W/PHACO Right 11/25/2017   Procedure: CATARACT EXTRACTION PHACO AND INTRAOCULAR LENS PLACEMENT ([place]) RIGHT DIABETIC;  Surgeon: Leandrew Koyanagi, MD;  Location: [place];  Service: Ophthalmology;  Laterality: Right;  Diabetic - insulin and oral meds   COLONOSCOPY     endosopic  sinus      Prior to Admission medications   Medication Sig Start Date End Date Taking? Authorizing Provider  amLODipine (NORVASC) 2.5 MG tablet Take by mouth. 02/03/18 02/03/19  [provider]  baclofen  (LIORESAL) 10 MG tablet Take 1 tablet (10 mg total) by mouth 3 (three) times daily. 08/20/18 02/16/19  Milinda Pointer, MD  Calcium Carbonate-Vit D-Min (GNP CALCIUM 1200) 1200-1000 MG-UNIT CHEW Chew 1,200 mg by mouth daily with breakfast. Take in combination with vitamin D and magnesium. 08/20/18 02/16/19  Milinda Pointer, MD  carvedilol (COREG) 25 MG tablet  09/15/18   [provider]  Cholecalciferol (VITAMIN D3) 125 MCG (5000 UT) CAPS Take 1 capsule (5,000 Units total) by mouth daily with breakfast. Take along with calcium and magnesium. 08/20/18 02/16/19  Milinda Pointer, MD  dicyclomine (BENTYL) 10 MG capsule Take 10 mg by mouth 4 (four) times daily.    [provider]  DULoxetine (CYMBALTA) 30 MG capsule Take 30 mg by mouth daily.    [provider]  DULoxetine (CYMBALTA) 30 MG capsule TAKE 1 TABLET BY MOUTH TWICE DAILY 08/30/18   [provider]  gabapentin (NEURONTIN) 800 MG tablet Take 1 tablet (800 mg total) by mouth 4 (four) times daily. 08/20/18 02/16/19  Milinda Pointer, MD  Insulin Glargine, 2 Unit Dial, (TOUJEO MAX SOLOSTAR) 300 UNIT/ML SOPN Inject 140 Units into the skin daily. 30 units AM and 110 units HS    [provider]  Lidocaine-Menthol (ICY HOT LIDOCAINE PLUS MENTHOL EX) Apply 1 application topically.    [provider]  liraglutide (VICTOZA) 18 MG/3ML SOPN Inject 1.8 mg into the skin daily.    [provider]  lisinopril (PRINIVIL,ZESTRIL) 40 MG tablet Take 40 mg by mouth daily.    [provider]  Magnesium 500 MG CAPS Take 1 capsule (500 mg total) by mouth 2 (two) times daily at 8 am and 10 pm. 08/20/18 02/16/19  Milinda Pointer, MD  meloxicam (MOBIC) 15 MG tablet Take 1 tablet (15 mg total) by mouth daily. 08/20/18 02/16/19  Milinda Pointer, MD  metoprolol succinate (TOPROL-XL) 50 MG 24 hr tablet Take 25 mg by mouth 2 (two) times daily.     [provider]  pantoprazole (PROTONIX) 40 MG  tablet Take 40 mg by mouth 2 (two) times daily.    [provider]  polyethylene glycol (MIRALAX / GLYCOLAX) packet Take 17 g by mouth daily. Mix one tablespoon with 8oz of your favorite juice or water every day until you are having soft formed stools. Then start taking once daily if you didn't have a stool the day before. 12/17/17   Merlyn Lot, MD  traMADol (ULTRAM) 50 MG tablet Take 1 tablet (50 mg total) by mouth every 8 (eight) hours as needed for severe pain. 08/20/18 02/16/19  Milinda Pointer, MD    Allergies Patient has no known allergies.  Family History  Problem Relation Age of Onset   Breast cancer Maternal Aunt    Cancer Father    Alcohol abuse Father     Social History Social History   Tobacco Use   Smoking status: Never Smoker   Smokeless tobacco: Never Used  Substance Use Topics   Alcohol use: Not Currently    Alcohol/week: 0.0 - 1.0 standard drinks   Drug use: No    Review of Systems  Constitutional: Negative for fever. + Lightheadedness Eyes: Negative for visual changes. ENT: Negative for sore throat. Neck: No neck pain  Cardiovascular: Negative for  chest pain. Respiratory: Negative for shortness of breath. Gastrointestinal: Negative for abdominal pain, vomiting. + nausea and diarrhea. Genitourinary: Negative for dysuria. + Frequency Musculoskeletal: Negative for back pain. Skin: Negative for rash. Neurological: Negative for headaches, weakness or numbness. Psych: No SI or HI  ____________________________________________   PHYSICAL EXAM:  VITAL SIGNS: ED Triage Vitals  Enc Vitals Group     BP 10/05/18 1906 (!) 180/94     Pulse Rate 10/05/18 1906 80     Resp 10/05/18 1906 18     Temp 10/05/18 1906 98.9 F (37.2 C)     Temp Source 10/05/18 1906 Oral     SpO2 10/05/18 1906 99 %     Weight 10/05/18 1907 125 lb (56.7 kg)     Height 10/05/18 1907 4\' 11"  (1.499 m)     Head Circumference --      [facility] Flow --      Pain Score  10/05/18 1907 0     Pain Loc --      Pain Edu? --      Excl. in [place]? --     Constitutional: Alert and oriented. Well appearing and in no apparent distress. HEENT:      Head: Normocephalic and atraumatic.         Eyes: Conjunctivae are normal. Sclera is non-icteric.       Mouth/Throat: Mucous membranes are moist.       Neck: Supple with no signs of meningismus. Cardiovascular: Regular rate and rhythm. No murmurs, gallops, or rubs. 2+ symmetrical distal pulses are present in all extremities. No JVD. Respiratory: Normal respiratory effort. Lungs are clear to auscultation bilaterally. No wheezes, crackles, or rhonchi.  Gastrointestinal: Soft, non tender, and non distended with positive bowel sounds. No rebound or guarding. Musculoskeletal: Nontender with normal range of motion in all extremities. No edema, cyanosis, or erythema of extremities. Neurologic: Normal speech and language. Face is symmetric.  Extraocular movements are intact, pupils are equal round reactive, intact strength and sensation x4, normal gait Skin: Skin is warm, dry and intact. No rash noted. Psychiatric: Mood and affect are normal. Speech and behavior are normal.  ____________________________________________   LABS (all labs ordered are listed, but only abnormal results are displayed)  Labs Reviewed  CBC WITH DIFFERENTIAL/PLATELET - Abnormal; Notable for the following components:      Result Value   WBC 10.8 (*)    All other components within normal limits  COMPREHENSIVE METABOLIC PANEL - Abnormal; Notable for the following components:   Sodium 134 (*)    Chloride 93 (*)    Glucose, Bld 435 (*)    Calcium 10.4 (*)    All other components within normal limits  URINALYSIS, COMPLETE (UACMP) WITH MICROSCOPIC - Abnormal; Notable for the following components:   Color, Urine STRAW (*)    APPearance CLEAR (*)    Glucose, UA >=500 (*)    All other components within normal limits  GLUCOSE, CAPILLARY - Abnormal; Notable  for the following components:   Glucose-Capillary 261 (*)    All other components within normal limits  TROPONIN I (HIGH SENSITIVITY)   ____________________________________________  EKG  ED ECG REPORT I, Rudene Re, the attending physician, personally viewed and interpreted this ECG.   Normal sinus rhythm, rate of 80, normal intervals, LVH, normal axis, no ST elevations or depressions.  Unchanged from prior. ____________________________________________  RADIOLOGY  I have personally reviewed the images performed during this visit and I agree with the Radiologist's read.   Interpretation  by Radiologist:  Ct Head Wo Contrast  Result Date: 10/06/2018 CLINICAL DATA:  Headache, dizziness EXAM: CT HEAD WITHOUT CONTRAST TECHNIQUE: Contiguous axial images were obtained from the base of the skull through the vertex without intravenous contrast. COMPARISON:  None. FINDINGS: Brain: Large partially calcified lesion appears to arise from the right middle cranial fossa with upward extension and mild regional vasogenic edema. Mild mass effect with local sulcal effacement and partial effacement of the frontal horn of the right lateral ventricle. A smaller live similar appearing 9 mm partially calcified lesion is also present along the right parietal convexity (2/9). No evidence of acute infarction, hemorrhage, hydrocephalus, or extra-axial collection. Vascular: No hyperdense vessel or unexpected calcification. Skull: No abnormal sclerosis of the adjacent calvaria. Hyperostosis frontalis interna. No scalp swelling or hematoma. Sinuses/Orbits: Paranasal sinuses and mastoid air cells are predominantly clear. Included orbital structures are unremarkable. Other: None. IMPRESSION: Partially calcified extra-axial appearing mass arising from the right middle cranial fossa/sphenoid wing with some adjacent mass effect and regional edema. This is most likely a meningioma. Brain MR without and with contrast could  be ordered to confirm. Additional smaller partially calcified lesion along the right parietal could reflect an additional meningioma. These results were called by telephone at the time of interpretation on 10/06/2018 at 12:11 am to provider [facility] , who verbally acknowledged these results. Electronically Signed   By: Lovena Le M.D.   On: 10/06/2018 00:12   Mr Jeri Cos And Wo Contrast  Result Date: 10/06/2018 CLINICAL DATA:  58 year old female with headache and dizziness, partially calcified right middle cranial fossa mass on CT earlier tonight. EXAM: MRI HEAD WITHOUT AND WITH CONTRAST TECHNIQUE: Multiplanar, multiecho pulse sequences of the brain and surrounding structures were obtained without and with intravenous contrast. CONTRAST:  57mL GADAVIST GADOBUTROL 1 MMOL/ML IV SOLN COMPARISON:  Head CT 10/05/2018. FINDINGS: Study is intermittently degraded by motion artifact despite repeated imaging attempts. Brain: An extra-axial appearing mass arising cephalad from the right sphenoid wing demonstrates isointense precontrast T1 signal and enhances homogeneously encompassing 38 x 44 by 43 millimeters (AP by transverse by CC). The mass has mildly lobulated margins, and abuts the right ICA terminus as seen on series 27, image 10. The right M1 is elevated by the mass (series 23, image 21). The mass abuts but does not definitely invade the right cavernous sinus or pituitary. The mass also abuts the right orbital apex. There is associated mass effect on the right inferior frontal gyrus and right anterior temporal lobe with-T2 and FLAIR hyperintensity within the temporal lobe compatible with vasogenic edema (series 21, image 11). No midline shift or ventriculomegaly. No restricted diffusion or evidence of acute infarction. No other abnormal intracranial enhancement or dural thickening identified. Basilar cisterns remain patent. Negative cervicomedullary junction. Outside of the right temporal lobe there is  scattered cerebral white matter T2 and FLAIR hyperintensity, both periventricular and cortical. The configuration is nonspecific. No definite cortical encephalomalacia. No chronic blood products identified; mineralization of the mass is again evident. Vascular: Major intracranial vascular flow voids are grossly preserved. Skull and upper cervical spine: Visualized bone marrow signal is within normal limits. Negative visible cervical spine. Sinuses/Orbits: Postoperative changes to the right globe. Intraorbital soft tissues are otherwise negative. Paranasal sinuses and mastoids are stable and well pneumatized. Other: Grossly normal internal auditory structures. IMPRESSION: 1. Extra-axial 4.4 cm enhancing mass extending cephalad from the right sphenoid wing is most compatible with a Meningioma. Regional mass effect, including on the right MCA vessels,  with mild vasogenic edema in the right temporal lobe. The mass abuts but does not appear to invade the right ICA terminus, the right cavernous sinus, and the right orbital apex. 2. No other intracranial mass or acute intracranial abnormality. 3. Moderate for age nonspecific cerebral white matter signal changes, most commonly due to chronic small vessel disease. Electronically Signed   By: Genevie Ann M.D.   On: 10/06/2018 02:28     ____________________________________________   PROCEDURES  Procedure(s) performed: None Procedures Critical Care performed:  None ____________________________________________   INITIAL IMPRESSION / ASSESSMENT AND PLAN / ED COURSE  58 y.o. female with a history of hypertension, diabetes, GERD, chronic back pain who presents for evaluation of lightheadedness and elevated blood pressure in the setting of several days of increased urinary frequency, mild diarrhea and nausea.  Patient is extremely well-appearing in no distress, blood pressure is slightly elevated in triage however upon arrival to the room blood pressure is trending  down.  She is otherwise neurologically intact.   Ddx cardiac dysrhythmias versus ischemia versus dehydration versus DKA versus gastroenteritis versus UTI versus brain bleed.  EKG shows no signs of ischemia or dysrhythmias.  Labs showing hyperglycemia with no evidence of DKA. Hypercalcemia with Ca 10.4. CT head showing large calcified mass concerning for possible hemangioma causing regional edema and some adjacent mass-effect.  I spoke with the radiologist recommended MRI with and without contrast.  In the meantime will give IV fluids, evening dose of insulin, evening dose of Coreg. Will check UA. Will consult neurosurgery    _________________________ 2:52 AM on 10/06/2018 -----------------------------------------  MRI confirms hemangioma with vasogenic edema, compressing the right MCA vessels.  Discussed with Dr. Lacinda Axon from neurosurgery who recommended admitting patient for steroids, formal neurosurgery consult in the morning, management of patient's blood glucose while on steroids and hypertension.  Will discuss with the hospitalist for admission.  Patient has been updated on her results and plan.   As part of my medical decision making, I reviewed the following data within the [facility] notes reviewed and incorporated, Labs reviewed , EKG interpreted , Old chart reviewed, Radiograph reviewed , Discussed with admitting physician , A consult was requested and obtained from this/these consultant(s) Neurosurgery, Notes from prior ED visits and [place] Controlled Substance Database   Patient was evaluated in Emergency Department today for the symptoms described in the history of present illness. Patient was evaluated in the context of the global COVID-19 pandemic, which necessitated consideration that the patient might be at risk for infection with the SARS-CoV-2 virus that causes COVID-19. Institutional protocols and algorithms that pertain to the evaluation of patients at risk for  COVID-19 are in a state of rapid change based on information released by regulatory bodies including the CDC and federal and state organizations. These policies and algorithms were followed during the patient's care in the ED.   ____________________________________________   FINAL CLINICAL IMPRESSION(S) / ED DIAGNOSES   Final diagnoses:  Meningioma, cerebral (HCC)  Vasogenic edema (HCC)  Type 2 diabetes mellitus with hyperglycemia, unspecified whether long term insulin use ([place])      NEW MEDICATIONS STARTED DURING THIS VISIT:  ED Discharge Orders    None       Note:  This document was prepared using Dragon voice recognition software and may include unintentional dictation errors.    Alfred Levins, [place], MD 10/06/18 [id number]

## 2018-10-07 LAB — GLUCOSE, CAPILLARY
Glucose-Capillary: 41 mg/dL — CL (ref 70–99)
Glucose-Capillary: 106 mg/dL — ABNORMAL HIGH (ref 70–99)
Glucose-Capillary: 97 mg/dL (ref 70–99)
Glucose-Capillary: 210 mg/dL — ABNORMAL HIGH (ref 70–99)

## 2018-10-07 LAB — HEMOGLOBIN A1C
Hgb A1c MFr Bld: 14.6 % — ABNORMAL HIGH (ref 4.8–5.6)
Mean Plasma Glucose: 372 mg/dL

## 2018-10-07 MED ORDER — TOUJEO MAX SOLOSTAR 300 UNIT/ML ~~LOC~~ SOPN: 140.0000 [IU] | PEN_INJECTOR | Freq: Every day | SUBCUTANEOUS | 1 refills | Status: AC

## 2018-10-07 MED ORDER — LIVING WELL WITH DIABETES BOOK
Freq: Once | Status: AC
  Administered 2018-10-07: 14:00:00
  Filled 2018-10-07 (×2): qty 1

## 2018-10-07 NOTE — Plan of Care (Signed)
Nutrition Education Note   RD consulted for nutrition education regarding diabetes.   Lab Results  Component Value Date   HGBA1C 14.6 (H) 10/05/2018    RD provided "Nutrition and Type II Diabetes" handout from the Academy of Nutrition and Dietetics. Discussed different food groups and their effects on blood sugar, emphasizing carbohydrate-containing foods. Provided list of carbohydrates and recommended serving sizes of common foods.  Discussed importance of controlled and consistent carbohydrate intake throughout the day. Provided examples of ways to balance meals/snacks and encouraged intake of high-fiber, whole grain complex carbohydrates. Teach back method used.  Expect fair compliance.  Body mass index is 25.25 kg/m. Pt meets criteria for overweight based on current BMI.  Current diet order is CHO modified, patient is consuming approximately 100% of meals at this time. Labs and medications reviewed. No further nutrition interventions warranted at this time. RD contact information provided. If additional nutrition issues arise, please re-consult RD.  Koleen Distance MS, RD, LDN Pager #- [phone] Office#- [phone] After Hours Pager: [phone]

## 2018-10-07 NOTE — Plan of Care (Signed)
  Problem: Education: Goal: Knowledge of General Education information will improve Description Including pain rating scale, medication(s)/side effects and non-pharmacologic comfort measures Outcome: Adequate for Discharge   Problem: Health Behavior/Discharge Planning: Goal: Ability to manage health-related needs will improve Outcome: Adequate for Discharge   Problem: Clinical Measurements: Goal: Ability to maintain clinical measurements within normal limits will improve Outcome: Adequate for Discharge Goal: Will remain free from infection Outcome: Adequate for Discharge Goal: Diagnostic test results will improve Outcome: Adequate for Discharge Goal: Respiratory complications will improve Outcome: Adequate for Discharge Goal: Cardiovascular complication will be avoided Outcome: Adequate for Discharge   Problem: Activity: Goal: Risk for activity intolerance will decrease Outcome: Adequate for Discharge   Problem: Nutrition: Goal: Adequate nutrition will be maintained Outcome: Adequate for Discharge   Problem: Coping: Goal: Level of anxiety will decrease Outcome: Adequate for Discharge   Problem: Pain Managment: Goal: General experience of comfort will improve Outcome: Adequate for Discharge   Problem: Safety: Goal: Ability to remain free from injury will improve Outcome: Adequate for Discharge   

## 2018-10-07 NOTE — Progress Notes (Signed)
Inpatient Diabetes Program Recommendations  AACE/ADA: New Consensus Statement on Inpatient Glycemic Control   Target Ranges:  Prepandial:   less than 140 mg/dL      Peak postprandial:   less than 180 mg/dL (1-2 hours)      Critically ill patients:  140 - 180 mg/dL   Results for Charlene [name], Charlene [name] (MRN [id number]) as of 10/07/2018 08:54  Ref. Range 10/06/2018 07:58 10/06/2018 11:39 10/06/2018 16:23 10/06/2018 20:43 10/07/2018 08:09  Glucose-Capillary Latest Ref Range: 70 - 99 mg/dL 203 (H)  Novolog 5 units  Lantus 40 units 373 (H)  Novolog 15 units 167 (H)  Novolog 8 units 346 (H)  Novolog 4 units  Lantus 120 units 41 (LL)   Review of Glycemic Control  Diabetes history: DM2 Outpatient Diabetes medications: Toujeo 60 units QAM, Toujeo 140 units QHS, Victoza 1.8 mg daily, Metformin XR 500 mg BID Current orders for Inpatient glycemic control: Lantus 40 units QAM, Lantus 120 units QHS, Novolog 5 units TID with meals, Novolog 0-15 units TID with meals, Novolog 0-5 units QHS  Inpatient Diabetes Program Recommendations:   Insulin-Basal: Please consider holding morning dose of Lantus 40 units QAM today and decreasing evening Lantus to 110 units QHS.  A1C: Current A1C in process. Noted in [place] that last A1C was 15.8% on 09/15/18 indicating an average glucose of 407 mg/dl.   NOTE: Spoke with patient over the phone about diabetes and home regimen for diabetes control. Patient reports being followed by PCP for diabetes management and currently taking Toujeo 60 units QAM, Toujeo 140 units QHS, Victoza 1.8 mg daily, Metformin XR 500 mg BID as an outpatient for diabetes control. Patient reports taking DM medications as prescribed. Patient reports that she increased Toujeo from 40 units QAM to 60 units QAM and from 120 units QHS to 140 units QHS after her last appointment on 09/15/18. Patient states that glucose has remained in the 300-400's mg/dl despite increasing Toujeo.  Discussed A1C results  (15.8% on 09/15/18) and explained that A1C indicates an average glucose of 407 mg/dl.  Discussed glucose and A1C goals. Discussed importance of checking CBGs and maintaining good CBG control to prevent long-term and short-term complications. Explained how hyperglycemia leads to damage within blood vessels which lead to the common complications seen with uncontrolled diabetes. Stressed to the patient the importance of improving glycemic control to prevent further complications from uncontrolled diabetes. Discussed impact of nutrition, exercise, stress, sickness, and medications on diabetes control. Patient admits that she drinks regular sodas and does not always follow a carb modified diet.  Discussed carbohydrates, carbohydrate goals per day and meal, along with portion sizes. Encouraged patient to eliminate sugary beverages from diet and limit carbohydrates per meal. Patient states she feels she would benefit from talking with RD during hospitalization. Placed consult for RD. Ordered Living Well with DM book as well. Discussed insulin injection and patient reports that she is mainly injecting insulin in her abdomen but notes she is rotating from side to side between injections. Inquired if she was injecting into any scar tissue or knotty areas in abdomen and patient states that she has several knotty areas in abdomen and she does inject into them sometimes. Discussed importance of rotating insulin injection sites and explained that if insulin is injected into scar tissue, it may not be absorbed appropriately. Asked patient to avoid any knotty (scar tissue) areas and to consider other injection sites for insulin administration. Noted patient was seeing Dr. Gabriel Carina (Endocrinologist) and was last seen  on 03/22/18. Encouraged patient to arrange follow up with Endocrinologist for assistance with improving DM control.  Encouraged patient to check glucose 3- 4 times per day (before meals and at bedtime) and to keep a log book  of glucose readings and DM medication taken which patient will need to take to doctor appointments.  Patient verbalized understanding of information discussed and reports no further questions at this time related to diabetes.  Thanks, Barnie Alderman, RN, MSN, CDE Diabetes Coordinator Inpatient Diabetes Program [phone] (Team Pager)

## 2018-10-07 NOTE — Progress Notes (Addendum)
Patient given discharge instructions. IV taken out. Patient verbalized understanding without any questions or concerns. Patient received "Living Well with Diabetes" book. Being discharged in stable condition.   Update 1555: Work note given to patient.

## 2018-10-07 NOTE — Progress Notes (Addendum)
Hypoglycemic Event  CBG: 41  Treatment: Patient eating breakfast and 80z of juice given  Symptoms: lightheaded   Follow-up CBG: Time:0906 CBG Result:106  Possible Reasons for Event: patient had not had breakfast  Comments/MD notified:notified     Feliberto Gottron

## 2018-10-07 NOTE — Discharge Instructions (Signed)
Patient advised to keep log of her sugars at home. She can go back to her home does insulin once her sugars are stable.

## 2018-10-07 NOTE — Discharge Summary (Signed)
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 Arthritis    knees  . Degenerative disc disease, lumbar   . Diabetes mellitus without complication ([place])   . GERD (gastroesophageal reflux disease)   . Hypertension   . Neuropathy    left lower leg  . Polyneuropathy   . Polyneuropathy     HOSPITAL COURSE:   58 y.o.femalewith pertinent past medical history of uncontrolled diabetes mellitus, diabetic neuropathy, DDD, OA, hypertension, and GERD presenting to the ED with chief complaints of dizziness, nausea and persistent elevated blood pressure.  1.Right sphenoid wing meningioma-presenting with dizziness, short-term memory loss, nausea and persistent elevated BP -MRI brain shows right C4 narrowing meningioma with regional mass-effect including on the right MCA vessels with mild vasogenic edema in the right temporal lobe. -Dexamethasone 10 mg IV twice daily--taper it off per Dr Cari Caraway -CT head and neck angiogram reviewed. They will plan surgery at a later date. -Should will see [facility] neurosurgery on Tuesday. She already has an appointment.  2.Diabetes Mellitus Type 2 with complications-- uncontrolled - Recent HgbA1c8/269/20 was 15.8. Goal < 7.0 -Continue insulin glargine-- hundred 20 units at bedtime and 40 units daily. -Patient has dietary noncompliance.  Discussed with her importance of tight sugar control for upcoming surgery and overall well-being -diabetes coordinator consult placed - CBG monitoring-- had sugar of 41 this morning. Patient was little lightheaded. She is doing well walking around the home. Sugar is 106. This could be due to strict diet control in the hospital. Dietitian already has spoken with the patient along with diabetes coordinator education completed. - SSI - DM education and close PCP follow up -patient used to see endocrinology she will benefit outpatient endocrinology after discharge for tight sugar control. Will defer to PCP Dr. Ginette Pitman to arrange this.  3.HTN  + Goal BP <130/80 -Continue amlodipine, Coreg and lisinopril  4.GERD -continue Protonix  5.Diabetic polyneuropathy -Continue Cymbalta and gabapentin  6.DVT prophylaxis -ambulatory  Discharge patient to home. She will follow-up neurosurgery next week on the scheduled appointment. CONSULTS OBTAINED:  Treatment Team:  Meade Maw, MD  DRUG ALLERGIES:  No Known Allergies  DISCHARGE MEDICATIONS:   Allergies as of 10/07/2018   No Known Allergies     Medication List    STOP taking these medications   metoprolol succinate 50 MG 24 hr tablet Commonly known as: TOPROL-XL     TAKE these medications   amLODipine 2.5 MG tablet Commonly known as: NORVASC Take 2.5 mg by mouth daily.   baclofen 10 MG tablet Commonly known as: LIORESAL Take 1 tablet (10 mg total) by mouth 3 (three) times daily.   carvedilol 25 MG tablet Commonly known as: COREG Take 25 mg by mouth 2 (two) times daily with a meal.   dicyclomine 10 MG capsule Commonly known as: BENTYL Take 10 mg by mouth 4 (four) times daily.   DULoxetine 30 MG capsule Commonly known as: CYMBALTA TAKE 1 TABLET  BY MOUTH TWICE DAILY What changed: Another medication with the same name was removed. Continue taking this medication, and follow the directions you see here.    gabapentin 800 MG tablet Commonly known as: NEURONTIN Take 1 tablet (800 mg total) by mouth 4 (four) times daily.   GNP Calcium 1200 1200-1000 MG-UNIT Chew Chew 1,200 mg by mouth daily with breakfast. Take in combination with vitamin D and magnesium.   ICY HOT LIDOCAINE PLUS MENTHOL EX Apply 1 application topically.   liraglutide 18 MG/3ML Sopn Commonly known as: VICTOZA Inject 1.8 mg into the skin daily.   lisinopril 40 MG tablet Commonly known as: ZESTRIL Take 40 mg by mouth daily.   Magnesium 500 MG Caps Take 1 capsule (500 mg total) by mouth 2 (two) times daily at 8 am and 10 pm.   meloxicam 15 MG tablet Commonly known as: MOBIC Take 1 tablet (15 mg total) by mouth daily.   pantoprazole 40 MG tablet Commonly known as: PROTONIX Take 40 mg by mouth 2 (two) times daily.   polyethylene glycol 17 g packet Commonly known as: MIRALAX / GLYCOLAX Take 17 g by mouth daily. Mix one tablespoon with 8oz of your favorite juice or water every day until you are having soft formed stools. Then start taking once daily if you didn't have a stool the day before. What changed:   when to take this  reasons to take this   Toujeo Max SoloStar 300 UNIT/ML Sopn Generic drug: Insulin Glargine (2 Unit Dial) Inject 140 Units into the skin daily. 40 units AM and 110 units HS What changed: additional instructions   traMADol 50 MG tablet Commonly known as: ULTRAM Take 1 tablet (50 mg total) by mouth every 8 (eight) hours as needed for severe pain.   Vitamin D3 125 MCG (5000 UT) Caps Take 1 capsule (5,000 Units total) by mouth daily with breakfast. Take along with calcium and magnesium.       If you experience worsening of your admission symptoms, develop shortness of breath, life threatening emergency, suicidal or homicidal thoughts you must seek medical attention immediately by calling 911 or calling your MD immediately  if symptoms less severe.  You Must read complete  instructions/literature along with all the possible adverse reactions/side effects for all the Medicines you take and that have been prescribed to you. Take any new Medicines after you have completely understood and accept all the possible adverse reactions/side effects.   Please note  You were cared for by a hospitalist during your hospital stay. If you have any questions about your discharge medications or the care you received while you were in the hospital after you are discharged, you can call the unit and asked to speak with the hospitalist on call if the hospitalist that took care of you is not available. Once you are discharged, your primary care physician will handle any further medical issues. Please note that NO REFILLS for any discharge medications will be authorized once you are discharged, as it is imperative that you return to your primary care physician (or establish a relationship with a primary care physician if you do not have one) for your aftercare needs so that they can reassess your need for medications and monitor your lab values. Today   SUBJECTIVE  feels better now.  VITAL SIGNS:  Blood pressure (!) 156/98, pulse 85, temperature 99 F (37.2 C), temperature source Oral, resp. rate 20, height 4\' 11"  (1.499 m), weight 56.7 kg, SpO2 99 %.  I/O:    Intake/Output Summary (Last 24 hours) at 10/07/2018 1150 Last data filed at 10/07/2018 1026 Gross per 24 hour  Intake 240 ml  Output 0 ml  Net 240 ml    PHYSICAL EXAMINATION:  GENERAL:  58 y.o.-year-old patient lying in the bed with no acute distress.  EYES: Pupils equal, round, reactive to light and accommodation. No scleral icterus. Extraocular muscles intact.  HEENT: Head atraumatic, normocephalic. Oropharynx and nasopharynx clear.  NECK:  Supple, no jugular venous distention. No thyroid enlargement, no tenderness.  LUNGS: Normal breath sounds bilaterally, no wheezing, rales,rhonchi or crepitation. No use of accessory  muscles of respiration.  CARDIOVASCULAR: S1, S2 normal. No murmurs, rubs, or gallops.  ABDOMEN: Soft, non-tender, non-distended. Bowel sounds present. No organomegaly or mass.  EXTREMITIES: No pedal edema, cyanosis, or clubbing.  NEUROLOGIC: Cranial nerves II through XII are intact. Muscle strength 5/5 in all extremities. Sensation decreased both feet secondary to neuropathy chronic  gait not checked.  PSYCHIATRIC: The patient is alert and oriented x 3.  SKIN: No obvious rash, lesion, or ulcer.   DATA REVIEW:   CBC  Recent Labs  Lab 10/05/18 1909  WBC 10.8*  HGB 12.7  HCT 39.2  PLT 317    Chemistries  Recent Labs  Lab 10/05/18 1909  NA 134*  K 3.9  CL 93*  CO2 29  GLUCOSE 435*  BUN 7  CREATININE 0.53  CALCIUM 10.4*  AST 21  ALT 20  ALKPHOS 94  BILITOT 0.6    Microbiology Results   Recent Results (from the past 240 hour(s))  SARS CORONAVIRUS 2 (TAT 6-24 HRS) Nasopharyngeal Nasopharyngeal Swab     Status: None   Collection Time: 10/06/18  5:21 AM   Specimen: Nasopharyngeal Swab  Result Value Ref Range Status   SARS Coronavirus 2 NEGATIVE NEGATIVE Final    Comment: (NOTE) SARS-CoV-2 target nucleic acids are NOT DETECTED. The SARS-CoV-2 RNA is generally detectable in upper and lower respiratory specimens during the acute phase of infection. Negative results do not preclude SARS-CoV-2 infection, do not rule out co-infections with other pathogens, and should not be used as the sole basis for treatment or other patient management decisions. Negative results must be combined with clinical observations, patient history, and epidemiological information. The expected result is Negative. Fact Sheet for Patients: SugarRoll.be Fact Sheet for Healthcare Providers: https://www.woods-mathews.com/ This test is not yet approved or cleared by the [place] FDA and  has been authorized for detection and/or diagnosis of SARS-CoV-2  by FDA under an Emergency Use Authorization (EUA). This EUA will remain  in effect (meaning this test can be used) for the duration of the COVID-19 declaration under Section 56 4(b)(1) of the Act, 21 U.S.C. section 360bbb-3(b)(1), unless the authorization is terminated or revoked sooner. Performed at [facility], [place] [address], [place], [place] [address]     RADIOLOGY:  Ct Angio Head W Or Wo Contrast  Result Date: 10/06/2018 CLINICAL DATA:  Headache, new, malignancy suspected. EXAM: CT ANGIOGRAPHY HEAD TECHNIQUE: Multidetector CT imaging of the head was performed using the standard protocol during bolus administration of intravenous contrast. Multiplanar CT image reconstructions and MIPs were obtained to evaluate the vascular anatomy. CONTRAST:  47mL OMNIPAQUE IOHEXOL 350 MG/ML SOLN COMPARISON:  Brain MRI 10/06/2018, head CT 10/05/2018 FINDINGS: CTA HEAD A large extra-axial enhancing mass extending cephalad from the right sphenoid wing was more fully characterized on brain MRI performed earlier the same day 10/06/2018. Anterior circulation: The bilateral intracranial internal  carotid arteries are patent without significant stenosis. The mass abuts portions of the cavernous right ICA. Additionally, the mass abuts and may partially encase the supraclinoid right ICA and the origin of the hypoplastic A1 right anterior cerebral artery. Beyond the hypoplastic A1 segment, the right anterior cerebral artery is patent without significant proximal stenosis. The mass also appears to abut and may partially encase the origin of a fetal right posterior cerebral artery without high-grade vessel narrowing. Additionally, the mass abuts and uplifts, and may partially encase the M1 right middle cerebral artery. No apparent high-grade narrowing of the M1 right middle cerebral artery. The left middle and anterior cerebral arteries are patent without significant proximal stenosis. Posterior circulation: The  intracranial vertebral arteries are patent without significant stenosis. The left vertebral artery is hypoplastic beyond the origin of the left PICA. The basilar artery is patent without significant stenosis. Fetal origin right posterior cerebral artery as described. The right posterior cerebral artery is patent without significant proximal stenosis. The left posterior cerebral artery is patent without significant proximal stenosis. No intracranial aneurysm is identified. Venous sinuses: Evaluation limited due to contrast bolus timing. Anatomic variants: As described. IMPRESSION: A large extra-axial enhancing mass extending cephalad from the right sphenoid wing was more fully characterized on brain MRI performed earlier the same day 10/06/2018. The mass abuts and may partially encase portions of the supraclinoid right internal carotid artery, the M1 right middle cerebral artery, the origin of a hypoplastic A1 anterior cerebral artery, and the origin of a fetal right posterior cerebral artery. Please refer to the body of the report for full description. Evaluation for narrowing of the hypoplastic A1 ACA is limited due to small vessel size. Otherwise, no high-grade arterial stenosis is identified. No intracranial large vessel occlusion The mass also abuts portions of the cavernous right ICA. Electronically Signed   By: Kellie Simmering   On: 10/06/2018 16:58   Ct Head Wo Contrast  Result Date: 10/06/2018 CLINICAL DATA:  Headache, dizziness EXAM: CT HEAD WITHOUT CONTRAST TECHNIQUE: Contiguous axial images were obtained from the base of the skull through the vertex without intravenous contrast. COMPARISON:  None. FINDINGS: Brain: Large partially calcified lesion appears to arise from the right middle cranial fossa with upward extension and mild regional vasogenic edema. Mild mass effect with local sulcal effacement and partial effacement of the frontal horn of the right lateral ventricle. A smaller live similar  appearing 9 mm partially calcified lesion is also present along the right parietal convexity (2/9). No evidence of acute infarction, hemorrhage, hydrocephalus, or extra-axial collection. Vascular: No hyperdense vessel or unexpected calcification. Skull: No abnormal sclerosis of the adjacent calvaria. Hyperostosis frontalis interna. No scalp swelling or hematoma. Sinuses/Orbits: Paranasal sinuses and mastoid air cells are predominantly clear. Included orbital structures are unremarkable. Other: None. IMPRESSION: Partially calcified extra-axial appearing mass arising from the right middle cranial fossa/sphenoid wing with some adjacent mass effect and regional edema. This is most likely a meningioma. Brain MR without and with contrast could be ordered to confirm. Additional smaller partially calcified lesion along the right parietal could reflect an additional meningioma. These results were called by telephone at the time of interpretation on 10/06/2018 at 12:11 am to provider [facility] , who verbally acknowledged these results. Electronically Signed   By: Lovena Le M.D.   On: 10/06/2018 00:12   Mr Jeri Cos And Wo Contrast  Result Date: 10/06/2018 CLINICAL DATA:  58 year old female with headache and dizziness, partially calcified right middle cranial fossa mass  on CT earlier tonight. EXAM: MRI HEAD WITHOUT AND WITH CONTRAST TECHNIQUE: Multiplanar, multiecho pulse sequences of the brain and surrounding structures were obtained without and with intravenous contrast. CONTRAST:  35mL GADAVIST GADOBUTROL 1 MMOL/ML IV SOLN COMPARISON:  Head CT 10/05/2018. FINDINGS: Study is intermittently degraded by motion artifact despite repeated imaging attempts. Brain: An extra-axial appearing mass arising cephalad from the right sphenoid wing demonstrates isointense precontrast T1 signal and enhances homogeneously encompassing 38 x 44 by 43 millimeters (AP by transverse by CC). The mass has mildly lobulated margins, and  abuts the right ICA terminus as seen on series 27, image 10. The right M1 is elevated by the mass (series 23, image 21). The mass abuts but does not definitely invade the right cavernous sinus or pituitary. The mass also abuts the right orbital apex. There is associated mass effect on the right inferior frontal gyrus and right anterior temporal lobe with-T2 and FLAIR hyperintensity within the temporal lobe compatible with vasogenic edema (series 21, image 11). No midline shift or ventriculomegaly. No restricted diffusion or evidence of acute infarction. No other abnormal intracranial enhancement or dural thickening identified. Basilar cisterns remain patent. Negative cervicomedullary junction. Outside of the right temporal lobe there is scattered cerebral white matter T2 and FLAIR hyperintensity, both periventricular and cortical. The configuration is nonspecific. No definite cortical encephalomalacia. No chronic blood products identified; mineralization of the mass is again evident. Vascular: Major intracranial vascular flow voids are grossly preserved. Skull and upper cervical spine: Visualized bone marrow signal is within normal limits. Negative visible cervical spine. Sinuses/Orbits: Postoperative changes to the right globe. Intraorbital soft tissues are otherwise negative. Paranasal sinuses and mastoids are stable and well pneumatized. Other: Grossly normal internal auditory structures. IMPRESSION: 1. Extra-axial 4.4 cm enhancing mass extending cephalad from the right sphenoid wing is most compatible with a Meningioma. Regional mass effect, including on the right MCA vessels, with mild vasogenic edema in the right temporal lobe. The mass abuts but does not appear to invade the right ICA terminus, the right cavernous sinus, and the right orbital apex. 2. No other intracranial mass or acute intracranial abnormality. 3. Moderate for age nonspecific cerebral white matter signal changes, most commonly due to chronic  small vessel disease. Electronically Signed   By: Genevie Ann M.D.   On: 10/06/2018 02:28     CODE STATUS:     Code Status Orders  (From admission, onward)         Start     Ordered   10/06/18 0353  Full code  Continuous     10/06/18 0354        Code Status History    This patient has a current code status but no historical code status.   Advance Care Planning Activity      TOTAL TIME TAKING CARE OF THIS PATIENT: *40* minutes.    Fritzi Mandes M.D on 10/07/2018 at 11:50 AM  Between 7am to 6pm - Pager - [phone] After 6pm go to [email or web address] - password [facility]  Office  [phone]  CC: Primary care physician; Tracie Harrier, MD

## 2018-10-13 ENCOUNTER — Ambulatory Visit: Payer: BLUE CROSS/BLUE SHIELD

## 2018-10-20 ENCOUNTER — Ambulatory Visit: Payer: BLUE CROSS/BLUE SHIELD

## 2018-10-21 ENCOUNTER — Encounter: Payer: Self-pay | Admitting: Emergency Medicine

## 2018-10-21 ENCOUNTER — Emergency Department
Admission: EM | Admit: 2018-10-21 | Discharge: 2018-10-21 | Disposition: A | Discharge status: Home / Self Care | Payer: BLUE CROSS/BLUE SHIELD | Attending: Emergency Medicine | Admitting: Emergency Medicine

## 2018-10-21 ENCOUNTER — Other Ambulatory Visit: Payer: Self-pay

## 2018-10-21 DIAGNOSIS — Z79899 Other long term (current) drug therapy: Secondary | ICD-10-CM | POA: Insufficient documentation

## 2018-10-21 DIAGNOSIS — E860 Dehydration: Secondary | ICD-10-CM | POA: Diagnosis not present

## 2018-10-21 DIAGNOSIS — E1165 Type 2 diabetes mellitus with hyperglycemia: Secondary | ICD-10-CM | POA: Diagnosis not present

## 2018-10-21 DIAGNOSIS — M6281 Muscle weakness (generalized): Secondary | ICD-10-CM | POA: Diagnosis present

## 2018-10-21 DIAGNOSIS — Z794 Long term (current) use of insulin: Secondary | ICD-10-CM | POA: Insufficient documentation

## 2018-10-21 DIAGNOSIS — I10 Essential (primary) hypertension: Secondary | ICD-10-CM | POA: Diagnosis not present

## 2018-10-21 LAB — CBC WITH DIFFERENTIAL/PLATELET
Abs Immature Granulocytes: 0.05 10*3/uL (ref 0.00–0.07)
Basophils Absolute: 0 10*3/uL (ref 0.0–0.1)
Basophils Relative: 0 %
Eosinophils Absolute: 0 10*3/uL (ref 0.0–0.5)
Eosinophils Relative: 0 %
HCT: 34.5 % — ABNORMAL LOW (ref 36.0–46.0)
Hemoglobin: 11.2 g/dL — ABNORMAL LOW (ref 12.0–15.0)
Immature Granulocytes: 1 %
Lymphocytes Relative: 21 %
Lymphs Abs: 2.1 10*3/uL (ref 0.7–4.0)
MCH: 28.7 pg (ref 26.0–34.0)
MCHC: 32.5 g/dL (ref 30.0–36.0)
MCV: 88.5 fL (ref 80.0–100.0)
Monocytes Absolute: 0.7 10*3/uL (ref 0.1–1.0)
Monocytes Relative: 7 %
Neutro Abs: 7.3 10*3/uL (ref 1.7–7.7)
Neutrophils Relative %: 71 %
Platelets: 271 10*3/uL (ref 150–400)
RBC: 3.9 MIL/uL (ref 3.87–5.11)
RDW: 12.8 % (ref 11.5–15.5)
WBC: 10.2 10*3/uL (ref 4.0–10.5)
nRBC: 0 % (ref 0.0–0.2)

## 2018-10-21 LAB — URINALYSIS, COMPLETE (UACMP) WITH MICROSCOPIC
Bacteria, UA: NONE SEEN
Bilirubin Urine: NEGATIVE
Glucose, UA: >=500 mg/dL — AB
Hgb urine dipstick: NEGATIVE
Ketones, ur: NEGATIVE mg/dL
Leukocytes,Ua: NEGATIVE
Nitrite: NEGATIVE
Protein, ur: NEGATIVE mg/dL
Specific Gravity, Urine: 1.024 (ref 1.005–1.030)
pH: 5 (ref 5.0–8.0)

## 2018-10-21 LAB — COMPREHENSIVE METABOLIC PANEL
ALT: 21 U/L (ref 0–44)
AST: 17 U/L (ref 15–41)
Albumin: 3.6 g/dL (ref 3.5–5.0)
Alkaline Phosphatase: 81 U/L (ref 38–126)
Anion gap: 9 (ref 5–15)
BUN: 16 mg/dL (ref 6–20)
CO2: 27 mmol/L (ref 22–32)
Calcium: 8.9 mg/dL (ref 8.9–10.3)
Chloride: 94 mmol/L — ABNORMAL LOW (ref 98–111)
Creatinine, Ser: 0.73 mg/dL (ref 0.44–1.00)
GFR calc Af Amer: >60 mL/min (ref >60)
GFR calc non Af Amer: >60 mL/min (ref >60)
Glucose, Bld: 521 mg/dL (ref 70–99)
Potassium: 4.2 mmol/L (ref 3.5–5.1)
Sodium: 130 mmol/L — ABNORMAL LOW (ref 135–145)
Total Bilirubin: 0.6 mg/dL (ref 0.3–1.2)
Total Protein: 6.8 g/dL (ref 6.5–8.1)

## 2018-10-21 LAB — GLUCOSE, CAPILLARY
Glucose-Capillary: 477 mg/dL — ABNORMAL HIGH (ref 70–99)
Glucose-Capillary: 401 mg/dL — ABNORMAL HIGH (ref 70–99)
Glucose-Capillary: 201 mg/dL — ABNORMAL HIGH (ref 70–99)

## 2018-10-21 LAB — LIPASE, BLOOD: Lipase: 20 U/L (ref 11–51)

## 2018-10-21 MED ORDER — INSULIN ASPART 100 UNIT/ML ~~LOC~~ SOLN
10.0000 [IU] | Freq: Once | SUBCUTANEOUS | Status: AC
  Administered 2018-10-21: 16:00:00 10 [IU] via INTRAVENOUS
  Filled 2018-10-21: qty 1

## 2018-10-21 MED ORDER — SODIUM CHLORIDE 0.9 % IV BOLUS: 1000.0000 mL | Freq: Once | INTRAVENOUS | Status: DC

## 2018-10-21 MED ORDER — SODIUM CHLORIDE 0.9 % IV BOLUS
1000.0000 mL | Freq: Once | INTRAVENOUS | Status: AC
  Administered 2018-10-21: 1000 mL via INTRAVENOUS

## 2018-10-21 NOTE — ED Triage Notes (Signed)
Pt ems from home for high blood sugar and weakness that started today. Pt sleepy during triage.

## 2018-10-21 NOTE — ED Notes (Signed)
Pt oob to BR with stand-by assist 

## 2018-10-21 NOTE — ED Provider Notes (Signed)
[facility] Emergency Department Provider Note  ____________________________________________  Time seen: Approximately 2:56 PM  I have reviewed the triage vital signs and the nursing notes.   HISTORY  Chief Complaint Hyperglycemia    HPI Charlene [name] is a 58 y.o. female with a history of diabetes hypertension GERD who comes the ED complaining of generalized weakness that started today associated with somnolence.  Also noted to have markedly elevated blood sugar of almost 600 by EMS.  Patient reports usually her blood sugar runs about 200.  No recent changes to her medications.  She takes insulin glargine 30 units in the morning and 110 units at night, no SSI.    Denies any recent illness.  No COVID exposure body aches fevers or chills.      Past Medical History:  Diagnosis Date  . Arthritis    knees  . Degenerative disc disease, lumbar   . Diabetes mellitus without complication ([place])   . GERD (gastroesophageal reflux disease)   . Hypertension   . Neuropathy    left lower leg  . Polyneuropathy   . Polyneuropathy      Patient Active Problem List   Diagnosis Date Noted  . 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 Osteoarthritis involving multiple joints 08/04/2018  . Type 2 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, with long-term current use of insulin ([place]) 03/22/2018  . Abnormal EMG (07/06/2017) 11/23/2017  . Long-term insulin use ([place]) 11/10/2017  . Uncontrolled type 2 diabetes mellitus with hyperglycemia ([place]) 11/10/2017  . Vitamin D deficiency 10/21/2017  . DM type 2 with diabetic peripheral neuropathy ([place]) 10/21/2017  . DDD (degenerative disc disease), lumbar 10/21/2017  . Lumbar facet arthropathy 10/21/2017  . Lumbar facet syndrome (Bilateral) 10/21/2017  . Lumbar facet hypertrophy 10/21/2017  . Lumbar foraminal stenosis (L5-S1) (Right)  10/21/2017  . Osteoarthritis of knee (Bilateral) 10/21/2017  . Chronic musculoskeletal pain 10/21/2017  . Essential (primary) hypertension 10/12/2017  . Chronic ankle pain (Primary Area of Pain) (Left) 10/12/2017  . Chronic foot pain (Primary Area of Pain) (Left) 10/12/2017  . Chronic lower extremity pain (Secondary Area of Pain) (Bilateral) (L>R) 10/12/2017  . Chronic knee pain (Fourth Area of Pain) (Bilateral) (L>R) 10/12/2017  . Chronic low back pain [facility] Area of Pain) (Bilateral) (L>R) w/ sciatica (Left) 10/12/2017  . Chronic pain syndrome 10/12/2017  . Opiate use 10/12/2017  . Pharmacologic therapy 10/12/2017  . Disorder of skeletal system 10/12/2017  . Problems influencing health status 10/12/2017  . Neuropathy 08/11/2017  . Burning sensation of feet 07/06/2017  . Lower extremity numbness and tingling (Left) 07/06/2017  . Polyneuropathy associated with underlying disease ([place]) 05/29/2017  . DM type 2, uncontrolled, with neuropathy ([place]) 05/05/2017  . Neuropathic pain 06/29/2015  . GERD without esophagitis 01/16/2014     Past Surgical History:  Procedure Laterality Date  . ABDOMINAL HYSTERECTOMY    . arthroscopic rotator cuff    . CATARACT EXTRACTION W/PHACO Right 11/25/2017   Procedure: CATARACT EXTRACTION PHACO AND INTRAOCULAR LENS PLACEMENT ([place]) RIGHT DIABETIC;  Surgeon: Leandrew Koyanagi, MD;  Location: [facility];  Service: Ophthalmology;  Laterality: Right;  Diabetic - insulin and oral meds  . COLONOSCOPY    . endosopic sinus       Prior to Admission medications   Medication Sig Start Date End Date Taking? Authorizing Provider  amLODipine (NORVASC) 2.5 MG tablet Take 2.5 mg by mouth daily.  02/03/18  02/03/19  [provider]  baclofen (LIORESAL) 10 MG tablet Take 1 tablet (10 mg total) by mouth 3 (three) times daily. 08/20/18 02/16/19  Milinda Pointer, MD  Calcium Carbonate-Vit D-Min (GNP CALCIUM 1200) 1200-1000 MG-UNIT CHEW Chew 1,200 mg by  mouth daily with breakfast. Take in combination with vitamin D and magnesium. 08/20/18 02/16/19  Milinda Pointer, MD  carvedilol (COREG) 25 MG tablet Take 25 mg by mouth 2 (two) times daily with a meal.  09/15/18   [provider]  Cholecalciferol (VITAMIN D3) 125 MCG (5000 UT) CAPS Take 1 capsule (5,000 Units total) by mouth daily with breakfast. Take along with calcium and magnesium. 08/20/18 02/16/19  Milinda Pointer, MD  dicyclomine (BENTYL) 10 MG capsule Take 10 mg by mouth 4 (four) times daily.    [provider]  DULoxetine (CYMBALTA) 30 MG capsule TAKE 1 TABLET BY MOUTH TWICE DAILY 08/30/18   [provider]  gabapentin (NEURONTIN) 800 MG tablet Take 1 tablet (800 mg total) by mouth 4 (four) times daily. 08/20/18 02/16/19  Milinda Pointer, MD  Insulin Glargine, 2 Unit Dial, (TOUJEO MAX SOLOSTAR) 300 UNIT/ML SOPN Inject 140 Units into the skin daily. 40 units AM and 110 units HS 10/07/18   Fritzi Mandes, MD  Lidocaine-Menthol (ICY HOT LIDOCAINE PLUS MENTHOL EX) Apply 1 application topically.    [provider]  liraglutide (VICTOZA) 18 MG/3ML SOPN Inject 1.8 mg into the skin daily.    [provider]  lisinopril (PRINIVIL,ZESTRIL) 40 MG tablet Take 40 mg by mouth daily.    [provider]  Magnesium 500 MG CAPS Take 1 capsule (500 mg total) by mouth 2 (two) times daily at 8 am and 10 pm. 08/20/18 02/16/19  Milinda Pointer, MD  meloxicam (MOBIC) 15 MG tablet Take 1 tablet (15 mg total) by mouth daily. 08/20/18 02/16/19  Milinda Pointer, MD  pantoprazole (PROTONIX) 40 MG tablet Take 40 mg by mouth 2 (two) times daily.    [provider]  polyethylene glycol (MIRALAX / GLYCOLAX) packet Take 17 g by mouth daily. Mix one tablespoon with 8oz of your favorite juice or water every day until you are having soft formed stools. Then start taking once daily if you didn't have a stool the day before. Patient taking differently: Take 17 g by mouth  daily as needed. Mix one tablespoon with 8oz of your favorite juice or water every day until you are having soft formed stools. Then start taking once daily if you didn't have a stool the day before. 12/17/17   Merlyn Lot, MD  traMADol (ULTRAM) 50 MG tablet Take 1 tablet (50 mg total) by mouth every 8 (eight) hours as needed for severe pain. 08/20/18 02/16/19  Milinda Pointer, MD     Allergies Patient has no known allergies.   Family History  Problem Relation Age of Onset  . Breast cancer Maternal Aunt   . Cancer Father   . Alcohol abuse Father     Social History Social History   Tobacco Use  . Smoking status: Never Smoker  . Smokeless tobacco: Never Used  Substance Use Topics  . Alcohol use: Not Currently    Alcohol/week: 0.0 - 1.0 standard drinks  . Drug use: No    Review of Systems  Constitutional:   No fever or chills.  ENT:   No sore throat. No rhinorrhea. Cardiovascular:   No chest pain or syncope. Respiratory:   No dyspnea or cough. Gastrointestinal:   Negative for abdominal pain, vomiting  and diarrhea.  Musculoskeletal:   Negative for focal pain or swelling All other systems reviewed and are negative except as documented above in ROS and HPI.  ____________________________________________   PHYSICAL EXAM:  VITAL SIGNS: ED Triage Vitals  Enc Vitals Group     BP 10/21/18 1429 126/76     Pulse Rate 10/21/18 1429 78     Resp --      Temp 10/21/18 1429 97.9 F (36.6 C)     Temp Source 10/21/18 1429 Oral     SpO2 10/21/18 1429 97 %     Weight 10/21/18 1431 130 lb (59 kg)     Height 10/21/18 1431 4\' 11"  (1.499 m)     Head Circumference --      Peak Flow --      Pain Score 10/21/18 1430 0     Pain Loc --      Pain Edu? --      Excl. in [place]? --     Vital signs reviewed, nursing assessments reviewed.   Constitutional:   Alert and oriented. Non-toxic appearance. Eyes:   Conjunctivae are normal. EOMI. PERRL. ENT      Head:   Normocephalic and  atraumatic.      Nose:   Wearing a mask.      Mouth/Throat:   Wearing a mask.      Neck:   No meningismus. Full ROM. Hematological/Lymphatic/Immunilogical:   No cervical lymphadenopathy. Cardiovascular:   RRR. Symmetric bilateral radial and DP pulses.  No murmurs. Cap refill less than 2 seconds. Respiratory:   Normal respiratory effort without tachypnea/retractions. Breath sounds are clear and equal bilaterally. No wheezes/rales/rhonchi. Gastrointestinal:   Soft and nontender. Non distended. There is no CVA tenderness.  No rebound, rigidity, or guarding. Musculoskeletal:   Normal range of motion in all extremities. No joint effusions.  No lower extremity tenderness.  No edema. Neurologic:   Normal speech and language.  Motor grossly intact. No acute focal neurologic deficits are appreciated.  Skin:    Skin is warm, dry and intact. No rash noted.  No petechiae, purpura, or bullae.  ____________________________________________    LABS (pertinent positives/negatives) (all labs ordered are listed, but only abnormal results are displayed) Labs Reviewed  GLUCOSE, CAPILLARY - Abnormal; Notable for the following components:      Result Value   Glucose-Capillary 477 (*)    All other components within normal limits  COMPREHENSIVE METABOLIC PANEL - Abnormal; Notable for the following components:   Sodium 130 (*)    Chloride 94 (*)    Glucose, Bld 521 (*)    All other components within normal limits  CBC WITH DIFFERENTIAL/PLATELET - Abnormal; Notable for the following components:   Hemoglobin 11.2 (*)    HCT 34.5 (*)    All other components within normal limits  URINALYSIS, COMPLETE (UACMP) WITH MICROSCOPIC - Abnormal; Notable for the following components:   Color, Urine STRAW (*)    APPearance CLEAR (*)    Glucose, UA >=500 (*)    All other components within normal limits  GLUCOSE, CAPILLARY - Abnormal; Notable for the following components:   Glucose-Capillary 401 (*)    All other  components within normal limits  GLUCOSE, CAPILLARY - Abnormal; Notable for the following components:   Glucose-Capillary 201 (*)    All other components within normal limits  URINE CULTURE  LIPASE, BLOOD  CBG MONITORING, ED   ____________________________________________   EKG    ____________________________________________    RADIOLOGY  No results  found.  ____________________________________________   PROCEDURES Procedures  ____________________________________________  DIFFERENTIAL DIAGNOSIS   Dehydration, electrolyte abnormalities, DKA, UTI  CLINICAL IMPRESSION / ASSESSMENT AND PLAN / ED COURSE  Medications ordered in the ED: Medications  sodium chloride 0.9 % bolus 1,000 mL (1,000 mLs Intravenous Not Given 10/21/18 1444)  sodium chloride 0.9 % bolus 1,000 mL (0 mLs Intravenous Stopped 10/21/18 1728)  insulin aspart (novoLOG) injection 10 Units (10 Units Intravenous Given 10/21/18 1558)    Pertinent labs & imaging results that were available during my care of the patient were reviewed by me and considered in my medical decision making (see chart for details).  Charlene [name] was evaluated in Emergency Department on 10/21/2018 for the symptoms described in the history of present illness. She was evaluated in the context of the global COVID-19 pandemic, which necessitated consideration that the patient might be at risk for infection with the SARS-CoV-2 virus that causes COVID-19. Institutional protocols and algorithms that pertain to the evaluation of patients at risk for COVID-19 are in a state of rapid change based on information released by regulatory bodies including the CDC and federal and state organizations. These policies and algorithms were followed during the patient's care in the ED.   Patient comes ED complaining of generalized weakness/fatigue, found to be hyperglycemic.  Most consistent with dehydration due to hyperglycemia.  Will give IV fluids for  hydration, additional aspart insulin for glycemic control as needed.  Check labs.  No evidence of pneumonia or sepsis.  Abdomen is benign.  No exam findings to suggest skin or soft tissue infection.  Not suspicious for COVID.   ----------------------------------------- 5:57 PM on 10/21/2018 -----------------------------------------  Labs are reassuring, no evidence of acidosis or UTI.  After IV fluids and a small dose of IV aspart, repeat blood sugar is 200.  Patient is feeling better.  Vital signs remain normal.  She is comfortable with going home and her daughter at bedside also is comfortable with outpatient follow-up.  Stable for discharge home.     ____________________________________________   FINAL CLINICAL IMPRESSION(S) / ED DIAGNOSES    Final diagnoses:  Dehydration  Type 2 diabetes mellitus with hyperglycemia, with long-term current use of insulin [facility])     ED Discharge Orders    None      Portions of this note were generated with dragon dictation software. Dictation errors may occur despite best attempts at proofreading.   Carrie Mew, MD 10/21/18 [phone]

## 2018-10-22 LAB — URINE CULTURE: Culture: <10000 — AB

## 2018-11-10 ENCOUNTER — Ambulatory Visit: Payer: BLUE CROSS/BLUE SHIELD

## 2018-11-18 DIAGNOSIS — M25569 Pain in unspecified knee: Secondary | ICD-10-CM | POA: Insufficient documentation

## 2018-11-22 ENCOUNTER — Ambulatory Visit: Payer: BLUE CROSS/BLUE SHIELD | Attending: Neurology

## 2018-11-22 DIAGNOSIS — M25561 Pain in right knee: Secondary | ICD-10-CM | POA: Insufficient documentation

## 2018-11-22 DIAGNOSIS — R2689 Other abnormalities of gait and mobility: Secondary | ICD-10-CM | POA: Insufficient documentation

## 2018-11-22 DIAGNOSIS — M6281 Muscle weakness (generalized): Secondary | ICD-10-CM | POA: Insufficient documentation

## 2018-11-22 DIAGNOSIS — M79605 Pain in left leg: Secondary | ICD-10-CM | POA: Insufficient documentation

## 2018-11-24 ENCOUNTER — Ambulatory Visit: Payer: BLUE CROSS/BLUE SHIELD

## 2018-11-29 ENCOUNTER — Ambulatory Visit: Payer: BLUE CROSS/BLUE SHIELD

## 2018-12-01 ENCOUNTER — Ambulatory Visit: Payer: Self-pay

## 2018-12-02 ENCOUNTER — Ambulatory Visit: Payer: BLUE CROSS/BLUE SHIELD | Admitting: Physical Therapy

## 2018-12-02 ENCOUNTER — Other Ambulatory Visit: Payer: Self-pay

## 2018-12-02 ENCOUNTER — Encounter: Payer: Self-pay | Admitting: Physical Therapy

## 2018-12-02 DIAGNOSIS — R2689 Other abnormalities of gait and mobility: Secondary | ICD-10-CM | POA: Diagnosis present

## 2018-12-02 DIAGNOSIS — M6281 Muscle weakness (generalized): Secondary | ICD-10-CM | POA: Diagnosis not present

## 2018-12-02 DIAGNOSIS — M25561 Pain in right knee: Secondary | ICD-10-CM | POA: Diagnosis present

## 2018-12-02 DIAGNOSIS — M79605 Pain in left leg: Secondary | ICD-10-CM | POA: Diagnosis present

## 2018-12-02 NOTE — Therapy (Signed)
[place] MAIN [facility] SERVICES [address] [place], [name], [address] Phone: [phone]   Fax:  [phone]  Physical Therapy Evaluation  Patient Details  Name: Charlene [name] MRN: [id number] Date of Birth: Sep 10, 1960 Referring Provider (PT): potter Alroy Dust   Encounter Date: 12/02/2018  PT End of Session - 12/02/18 0926    Visit Number  1    Number of Visits  17    Date for PT Re-Evaluation  01/26/19    Authorization Type  eval date 12/02/18    PT Start Time  0920    PT Stop Time  1000    PT Time Calculation (min)  40 min    Equipment Utilized During Treatment  Gait belt    Activity Tolerance  Patient tolerated treatment well    Behavior During Therapy  [facility] for tasks assessed/performed       Past Medical History:  Diagnosis Date  . Arthritis    knees  . Degenerative disc disease, lumbar   . Diabetes mellitus without complication ([place])   . GERD (gastroesophageal reflux disease)   . Hypertension   . Neuropathy    left lower leg  . Polyneuropathy   . Polyneuropathy     Past Surgical History:  Procedure Laterality Date  . ABDOMINAL HYSTERECTOMY    . arthroscopic rotator cuff    . CATARACT EXTRACTION W/PHACO Right 11/25/2017   Procedure: CATARACT EXTRACTION PHACO AND INTRAOCULAR LENS PLACEMENT ([place]) RIGHT DIABETIC;  Surgeon: Leandrew Koyanagi, MD;  Location: [place];  Service: Ophthalmology;  Laterality: Right;  Diabetic - insulin and oral meds  . COLONOSCOPY    . endosopic sinus      There were no vitals filed for this visit.   Subjective Assessment - 12/02/18 0919    Subjective  Patient arrived 15 mins late to appointment. She reports having trouble with her legs.    Pertinent History  Patient is a pleasant 58 year old female returning to physical therapy after hospitalization for a mass on her brain. Referred for severe neuropathy, gait and balance training.  Patient last seen 09/10/18. 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 Per physician notes POC for brain mass:  repeat MRI in 6 months with aggressive treat of her blood sugars and blood pressure with potential for future of surgery.    Currently in Pain?  Yes    Pain Score  8     Pain Location  Leg    Pain Orientation  Right;Left    Pain Descriptors / Indicators  Aching    Pain Type  Chronic pain    Pain Radiating Towards  toes    Pain Onset  More than a month ago    Pain Frequency  Constant    Aggravating Factors   prolonged standing / walking    Pain Relieving Factors  rest    Effect of Pain on Daily Activities  difficult to perform household tasks         [facility] PT Assessment - 12/02/18 0920      Assessment   Medical Diagnosis  neuropathy    Referring Provider (PT)  potter Alroy Dust    Onset Date/Surgical Date  10/20/18    Hand Dominance  Right    Prior Therapy  out patient PT      Precautions   Precautions  Fall      Restrictions   Weight Bearing Restrictions  No      Balance Screen   Has the patient fallen in the past 6 months  Yes    How many times?  2    Has the patient had a decrease in activity level because of a fear of falling?   Yes    Is the patient reluctant to leave their home because of a fear of falling?   No      Home Environment   Living Environment  Private residence    Living Arrangements  Children    Available Help at Discharge  Family    Type of [place] to enter    Entrance Stairs-Number of Steps  3    Entrance Stairs-Rails  [place]  One level    [place] - single point      Prior Function   Level of Independence  Independent with basic ADLs;Independent with household mobility with device    Vocation  On disability    Leisure  shop      Cognition   Overall  Cognitive Status  Within Functional Limits for tasks assessed       .PAIN: 8/10 BLE  POSTURE: WNL   PROM/AROM:WFL BUE and BLE  STRENGTH:  Graded on a 0-5 scale Muscle Group Left Right                          Hip Flex 2+/5 3/5  Hip Abd 2+/5 3/5      Hip Ext 2+/5 -3/5      Knee Flex 3/5 3/5  Knee Ext 3/5 3/5  Ankle DF 3/5 3/5  Ankle PF 3/5 3/5   SENSATION: LLE numbness leg and foot   FUNCTIONAL MOBILITY:extra time needed for transfers and rolling in bed Needs UE support for sit to stand transfer   BALANCE: Static Standing Balance  Normal Able to maintain standing balance against maximal resistance   Good Able to maintain standing balance against moderate resistance   Good-/Fair+ Able to maintain standing balance against minimal resistance   Fair Able to stand unsupported without UE support and without LOB for 1-2 min x  Fair- Requires Min A and UE support to maintain standing without loss of balance   Poor+ Requires mod A and UE support to maintain standing without loss of balance   Poor Requires max A and UE support to maintain standing balance without loss    Standing Dynamic Balance  Normal Stand independently unsupported, able to weight shift and cross midline maximally   Good Stand independently unsupported, able to weight shift and cross midline moderately   Good-/Fair+ Stand independently unsupported, able to weight shift across midline minimally x  Fair Stand independently unsupported, weight shift, and reach ipsilaterally, loss of balance when crossing midline   Poor+ Able to stand with Min A and reach ipsilaterally, unable to weight shift   Poor Able to stand with Mod A and minimally reach ipsilaterally, unable to cross midline.       GAIT: Patient ambulates with SPC and decreased gait speed and has difficulty with head turns and path deviation with increased speeds  OUTCOME MEASURES: TEST Outcome Interpretation  LEFS 46/80 80/80 is WNL  10 meter  walk test      .46  m/s <1.0 m/s indicates increased risk for falls; limited community ambulator          Berg Balance Assessment deffered due to time constraint  <36/56 (100% risk for falls), 37-45 (80% risk for falls); 46-51 (>50% risk for falls); 52-55 (lower risk <25% of falls)        Treatment: Leg press 45 lbs x 20 x 3 , heel raises 30 lbs x 20 x 2  Patient performed with instruction, verbal cues, tactile cues of therapist: goal: increase tissue extensibility, promote proper posture, improve mobility         Objective measurements completed on examination: See above findings.              PT Education - 12/02/18 0925    Education Details  plan of care    Person(s) Educated  Patient    Methods  Explanation    Comprehension  Verbalized understanding       PT Short Term Goals - 12/02/18 0931      PT SHORT TERM GOAL #1   Title  Patient will be independent in home exercise program to improve strength/mobility for better functional independence with ADLs.     Time  4    Period  Weeks    Status  New    Target Date  12/30/18      PT SHORT TERM GOAL #2   Title  Patient will report a worst pain of 6/10 on the VAS during ambulation/functional mobility.    Baseline  12/02/18=8/10 BLE    Time  4    Period  Weeks    Status  New    Target Date  12/30/18      PT SHORT TERM GOAL #3   Title  Patient will increase lower extremity functional scale to >60/80 to demonstrate improved functional mobility and increased tolerance with ADLs.    Baseline  12/02/18=46/80    Time  4    Period  Weeks    Status  New    Target Date  12/30/18        PT Long Term Goals - 12/02/18 0932      PT LONG TERM GOAL #1   Title  Patient will report a worst pain of 3/10 on VAS with ambulation/functional mobility for improved quality of life.    Baseline  12/02/18= 8/10 BLE    Time  8    Period  Weeks    Status  New    Target Date  01/20/19      PT LONG TERM GOAL #2    Title  Patient will improve score on 10MWT to >0.21m/s to improve independence with ambulation and to decrease risk of falls.    Baseline  12/02/18=. 46 m/sec    Time  8    Period  Weeks    Status  New    Target Date  01/20/19      PT LONG TERM GOAL #3   Title  Patient will improve score on Berg Balance Test to at least 50/56 to demonstrate improved balance and safety with functional mobility.    Baseline  12/02/18 deferred to next visit due to patient being late to eval    Time  8    Period  Weeks    Status  New    Target Date  01/20/19      PT LONG TERM GOAL #4   Title  Patient will demonstrate BLE strength of 4-/5 to improve gait mechanics  and safety with mobility    Baseline  12/02/18 =;limited by pain: grossly 2+/5 8/14: L 3/5 R 4-/5    Time  8    Period  Weeks    Status  New    Target Date  01/20/19      PT LONG TERM GOAL #5   Title  Patient will negotiate 4 steps with supervision using unilateral UE support (L railing ascending, R railing descending) to promote safety and independence in home environment.    Baseline  12/02/18 patient needs rail support bilaterally    Time  8    Period  Weeks    Status  New    Target Date  01/20/19      PT LONG TERM GOAL #6   Title  --    Baseline  --    Time  8    Period  Weeks    Status  New    Target Date  01/20/19      PT LONG TERM GOAL #7   Title  Patient will increase lower extremity functional scale to >60/80 to demonstrate improved functional mobility and increased tolerance with ADLs.    Baseline  12/02/18= 46/80    Time  8    Period  Weeks    Status  New    Target Date  01/20/19             Plan - 12/02/18 [id number]    Clinical Impression Statement  Patient is a pleasant 58 year old female returning to physical therapy after hospitalization for a mass on her brain. Referred for severe neuropathy, gait and balance training.  Patient last seen 09/10/18. PMH includes meningioma of right sphenoid wing involving cavernous  sinus, OA of multiple joints, DM Type II, DDD, lumbar foraminal stenosis, HTN, chronic pain syndrome, opiate use, neuropathy, GERD, polyneuropathy. Per physician notes POC for brain mass:  repeat MRI in 6 months with aggressive treat of her blood sugars and blood pressure with potential for future of surgery. PT examination reveals grossly weak BLE with extra time  for sit to stand transfers. Patient has significantly impaired balance. Gait speed is also below age/gender normal with spc. Pt will benefit from skilled PT services to address deficits in balance and strength in order to improve function and decrease fall risk.      Personal Factors and Comorbidities  Comorbidity 1    Comorbidities  DM type II, severe diabetic neuropathy, HTN, arthritis, DDD, GERD, brain tumor    Examination-Activity Limitations  Bend;Caring for Others;Carry;Reach Overhead;Squat;Stairs;Stand;Transfers    Examination-Participation Restrictions  Cleaning;Community Activity;Driving;Meal Prep;Yard Work    [employer]  Evolving/Moderate complexity    Clinical Decision Making  Moderate    Clinical Presentation due to:  increased pain/deficit with new onset of RLE PFPS  , fluctuation of severity of symptoms Patient is a pleasant 58 year old female returning to physical therapy after hospitalization for a mass on her brain. Referred for severe neuropathy, gait and balance training.  Patient last seen 09/10/18. 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 Per physician notes POC for brain mass:  repeat MRI in 6 months with aggressive treat of her blood sugars and blood pressure with potential for future of surgery.    Rehab Potential  Fair    Clinical Impairments Affecting Rehab Potential  (+) family support, motivation,  age; (-) financial  limitations, other medical comorbidities, chronicity    PT Frequency  1x / week    PT Duration  8 weeks    PT Treatment/Interventions  ADLs/Self Care Home Management;Aquatic Therapy;Biofeedback;Cryotherapy;Electrical Stimulation;Iontophoresis 4mg /ml Dexamethasone;Moist Heat;Traction;Ultrasound;DME Instruction;Gait training;Stair training;Functional mobility training;Therapeutic activities;Therapeutic exercise;Balance training;Neuromuscular re-education;Patient/family education;Cognitive remediation;Orthotic Fit/Training;Manual techniques;Passive range of motion;Compression bandaging;Energy conservation;Splinting;Taping;Visual/perceptual remediation/compensation;Dry needling    PT Next Visit Plan  continue with current program to progress strengthening and balance    PT Home Exercise Plan  LAQ, hip flex    Consulted and Agree with Plan of Care  Patient       Patient will benefit from skilled therapeutic intervention in order to improve the following deficits and impairments:  Abnormal gait, Cardiopulmonary status limiting activity, Decreased activity tolerance, Decreased balance, Decreased endurance, Decreased coordination, Decreased knowledge of use of DME, Decreased mobility, Decreased strength, Difficulty walking, Increased muscle spasms, Impaired perceived functional ability, Impaired flexibility, Increased edema, Decreased range of motion, Impaired vision/preception, Improper body mechanics, Postural dysfunction, Pain, Impaired sensation  Visit Diagnosis: Muscle weakness (generalized)  Other abnormalities of gait and mobility  Pain in left leg  Acute pain of right knee     Problem List Patient Active Problem List   Diagnosis Date Noted  . 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[place]) 10/06/2018  . Osteoarthritis involving multiple joints 08/04/2018  . Type 2 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, with  long-term current use of insulin ([place]) 03/22/2018  . Abnormal EMG (07/06/2017) 11/23/2017  . Long-term insulin use ([place]) 11/10/2017  . Uncontrolled type 2 diabetes mellitus with hyperglycemia ([place]) 11/10/2017  . Vitamin D deficiency 10/21/2017  . DM type 2 with diabetic peripheral neuropathy ([place]) 10/21/2017  . DDD (degenerative disc disease), lumbar 10/21/2017  . Lumbar facet arthropathy 10/21/2017  . Lumbar facet syndrome (Bilateral) 10/21/2017  . Lumbar facet hypertrophy 10/21/2017  . Lumbar foraminal stenosis (L5-S1) (Right) 10/21/2017  . Osteoarthritis of knee (Bilateral) 10/21/2017  . Chronic musculoskeletal pain 10/21/2017  . Essential (primary) hypertension 10/12/2017  . Chronic ankle pain (Primary Area of Pain) (Left) 10/12/2017  . Chronic foot pain (Primary Area of Pain) (Left) 10/12/2017  . Chronic lower extremity pain (Secondary Area of Pain) (Bilateral) (L>R) 10/12/2017  . Chronic knee pain (Fourth Area of Pain) (Bilateral) (L>R) 10/12/2017  . Chronic low back pain [facility] Area of Pain) (Bilateral) (L>R) w/ sciatica (Left) 10/12/2017  . Chronic pain syndrome 10/12/2017  . Opiate use 10/12/2017  . Pharmacologic therapy 10/12/2017  . Disorder of skeletal system 10/12/2017  . Problems influencing health status 10/12/2017  . Neuropathy 08/11/2017  . Burning sensation of feet 07/06/2017  . Lower extremity numbness and tingling (Left) 07/06/2017  . Polyneuropathy associated with underlying disease ([place]) 05/29/2017  . DM type 2, uncontrolled, with neuropathy ([place]) 05/05/2017  . Neuropathic pain 06/29/2015  . GERD without esophagitis 01/16/2014    Alanson Puls, [place] DPT 12/02/2018, 11:02 AM  [place] MAIN [facility] SERVICES [address] [place], [name], [address] Phone: [phone]   Fax:  [phone]  Name: Charlene [name] MRN: [id number] Date of Birth: Jan 14, 1961

## 2018-12-06 ENCOUNTER — Ambulatory Visit: Payer: Self-pay

## 2018-12-07 ENCOUNTER — Ambulatory Visit: Payer: BLUE CROSS/BLUE SHIELD

## 2018-12-07 ENCOUNTER — Other Ambulatory Visit: Payer: Self-pay

## 2018-12-07 DIAGNOSIS — R2689 Other abnormalities of gait and mobility: Secondary | ICD-10-CM

## 2018-12-07 DIAGNOSIS — M6281 Muscle weakness (generalized): Secondary | ICD-10-CM

## 2018-12-07 DIAGNOSIS — M79605 Pain in left leg: Secondary | ICD-10-CM

## 2018-12-07 NOTE — Therapy (Signed)
[name] SERVICES [address] [place], [name], [address] Phone: [phone]   Fax:  [phone]  Physical Therapy Treatment  Patient Details  Name: Charlene [name] MRN: [id number] Date of Birth: [birth year] Referring Provider (PT): potter Alroy Dust   Encounter Date: 12/07/2018  PT End of Session - 12/07/18 1259    Visit Number  2    Number of Visits  17    Date for PT Re-Evaluation  01/26/19    Authorization Type  eval date 12/02/18    PT Start Time  1116    PT Stop Time  1201    PT Time Calculation (min)  45 min    Equipment Utilized During Treatment  Gait belt    Activity Tolerance  Patient tolerated treatment well    Behavior During Therapy  [facility] for tasks assessed/performed       Past Medical History:  Diagnosis Date  . Arthritis    knees  . Degenerative disc disease, lumbar   . Diabetes mellitus without complication ([place])   . GERD (gastroesophageal reflux disease)   . Hypertension   . Neuropathy    left lower leg  . Polyneuropathy   . Polyneuropathy     Past Surgical History:  Procedure Laterality Date  . ABDOMINAL HYSTERECTOMY    . arthroscopic rotator cuff    . CATARACT EXTRACTION W/PHACO Right 11/25/2017   Procedure: CATARACT EXTRACTION PHACO AND INTRAOCULAR LENS PLACEMENT ([place]) RIGHT DIABETIC;  Surgeon: Leandrew Koyanagi, MD;  Location: [place];  Service: Ophthalmology;  Laterality: Right;  Diabetic - insulin and oral meds  . COLONOSCOPY    . endosopic sinus      There were no vitals filed for this visit.  Subjective Assessment - 12/07/18 1144    Subjective  Patient made sure to arrive early for appointment today. No falls or LOB since last session. Has intermittent knee pain but is not bothered today.    Pertinent History  Patient is a pleasant 58 year old female returning to physical therapy after hospitalization for a mass on her brain. Referred for severe neuropathy, gait and balance  training.  Patient last seen 09/10/18. 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 Per physician notes POC for brain mass:  repeat MRI in 6 months with aggressive treat of her blood sugars and blood pressure with potential for future of surgery.    Currently in Pain?  Yes    Pain Score  2     Pain Location  Leg    Pain Orientation  Right;Left    Pain Descriptors / Indicators  Aching    Pain Type  Chronic pain    Pain Onset  More than a month ago    Pain Frequency  Constant          HEP: good days: standing interventions bad days: seated interventions   Access Code: [email or web address]  URL: [email or web address]  Date: 12/07/2018  Prepared by: Janna Arch   Exercises Seated March - 10 reps - 2 sets - 5 hold - 1x daily - 7x weekly Standing March with Counter Support - 10 reps - 2 sets - 5 hold - 1x daily - 7x weekly Seated Long Arc Quad - 10 reps - 2 sets - 5 hold - 1x daily - 7x weekly Seated Hip Abduction with Resistance - 10 reps -  2 sets - 5 hold - 1x daily - 7x weekly Standing Hip Abduction with Counter Support - 10 reps - 2 sets - 5 hold - 1x daily - 7x weekly Standing Hip Extension with Counter Support - 10 reps - 2 sets - 5 hold - 1x daily - 7x weekly  Nustep Lvl 2 RPM> 60 for cardiovascular support.   airex pad: one foot on 7" step one foot on airex pad, hold 30 seconds each position x 2 each trial.    airex pad: toe taps on 7" step no UE support, Min A to obtain COM, 12x each LE  airex pad: lateral toe tap 7" step no UE support  Seated: cross body opp LE/UE raises 10x   Car transfer: Mod I from transfer chair to bed.              PT Education - 12/07/18 1258    Education Details  exercise technique, body mechanics    Person(s) Educated  Patient    Methods   Explanation;Demonstration;Tactile cues;Verbal cues    Comprehension  Verbalized understanding;Returned demonstration;Verbal cues required;Tactile cues required       PT Short Term Goals - 12/02/18 0931      PT SHORT TERM GOAL #1   Title  Patient will be independent in home exercise program to improve strength/mobility for better functional independence with ADLs.     Time  4    Period  Weeks    Status  New    Target Date  12/30/18      PT SHORT TERM GOAL #2   Title  Patient will report a worst pain of 6/10 on the VAS during ambulation/functional mobility.    Baseline  12/02/18=8/10 BLE    Time  4    Period  Weeks    Status  New    Target Date  12/30/18      PT SHORT TERM GOAL #3   Title  Patient will increase lower extremity functional scale to >60/80 to demonstrate improved functional mobility and increased tolerance with ADLs.    Baseline  12/02/18=46/80    Time  4    Period  Weeks    Status  New    Target Date  12/30/18        PT Long Term Goals - 12/02/18 0932      PT LONG TERM GOAL #1   Title  Patient will report a worst pain of 3/10 on VAS with ambulation/functional mobility for improved quality of life.    Baseline  12/02/18= 8/10 BLE    Time  8    Period  Weeks    Status  New    Target Date  01/20/19      PT LONG TERM GOAL #2   Title  Patient will improve score on 10MWT to >0.70m/s to improve independence with ambulation and to decrease risk of falls.    Baseline  12/02/18=. 46 m/sec    Time  8    Period  Weeks    Status  New    Target Date  01/20/19      PT LONG TERM GOAL #3   Title  Patient will improve score on Berg Balance Test to at least 50/56 to demonstrate improved balance and safety with functional mobility.    Baseline  12/02/18 deferred to next visit due to patient being late to eval    Time  8    Period  Weeks    Status  New    Target Date  01/20/19      PT LONG TERM GOAL #4   Title  Patient will demonstrate BLE strength of 4-/5 to  improve gait mechanics and safety with mobility    Baseline  12/02/18 =;limited by pain: grossly 2+/5 8/14: L 3/5 R 4-/5    Time  8    Period  Weeks    Status  New    Target Date  01/20/19      PT LONG TERM GOAL #5   Title  Patient will negotiate 4 steps with supervision using unilateral UE support (L railing ascending, R railing descending) to promote safety and independence in home environment.    Baseline  12/02/18 patient needs rail support bilaterally    Time  8    Period  Weeks    Status  New    Target Date  01/20/19      PT LONG TERM GOAL #6   Title  --    Baseline  --    Time  8    Period  Weeks    Status  New    Target Date  01/20/19      PT LONG TERM GOAL #7   Title  Patient will increase lower extremity functional scale to >60/80 to demonstrate improved functional mobility and increased tolerance with ADLs.    Baseline  12/02/18= 46/80    Time  8    Period  Weeks    Status  New    Target Date  01/20/19            Plan - 12/07/18 1300    Clinical Impression Statement  Patient educated on and performed HEP to demonstrate understanding and need for compliance. Patient is challenged with modified single limb stability requiring occasional UE support for stabilization. Pt will benefit from skilled PT services to address deficits in balance and strength in order to improve function and decrease fall risk.    Personal Factors and Comorbidities  Comorbidity 1    Comorbidities  DM type II, severe diabetic neuropathy, HTN, arthritis, DDD, GERD, brain tumor    Examination-Activity Limitations  Bend;Caring for Others;Carry;Reach Overhead;Squat;Stairs;Stand;Transfers    Examination-Participation Restrictions  Cleaning;Community Activity;Driving;Meal Prep;Yard Work    [employer]  Evolving/Moderate complexity    Rehab Potential  Fair    Clinical Impairments Affecting Rehab Potential  (+) family support, motivation, age; (-) financial limitations,  other medical comorbidities, chronicity    PT Frequency  1x / week    PT Duration  8 weeks    PT Treatment/Interventions  ADLs/Self Care Home Management;Aquatic Therapy;Biofeedback;Cryotherapy;Electrical Stimulation;Iontophoresis 4mg /ml Dexamethasone;Moist Heat;Traction;Ultrasound;DME Instruction;Gait training;Stair training;Functional mobility training;Therapeutic activities;Therapeutic exercise;Balance training;Neuromuscular re-education;Patient/family education;Cognitive remediation;Orthotic Fit/Training;Manual techniques;Passive range of motion;Compression bandaging;Energy conservation;Splinting;Taping;Visual/perceptual remediation/compensation;Dry needling    PT Next Visit Plan  continue with current program to progress strengthening and balance    PT Home Exercise Plan  LAQ, hip flex    Consulted and Agree with Plan of Care  Patient       Patient will benefit from skilled therapeutic intervention in order to improve the following deficits and impairments:  Abnormal gait, Cardiopulmonary status limiting activity, Decreased activity tolerance, Decreased balance, Decreased endurance, Decreased coordination, Decreased knowledge of use of DME, Decreased mobility, Decreased strength, Difficulty walking, Increased muscle spasms, Impaired perceived functional ability, Impaired flexibility, Increased edema, Decreased range of motion, Impaired vision/preception, Improper body mechanics, Postural dysfunction, Pain, Impaired sensation  Visit Diagnosis: Muscle weakness (generalized)  Other abnormalities of gait  and mobility  Pain in left leg     Problem List Patient Active Problem List   Diagnosis Date Noted  . 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 Osteoarthritis involving multiple joints 08/04/2018  . Type 2 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, with long-term current use of insulin ([place])  03/22/2018  . Abnormal EMG (07/06/2017) 11/23/2017  . Long-term insulin use ([place]) 11/10/2017  . Uncontrolled type 2 diabetes mellitus with hyperglycemia ([place]) 11/10/2017  . Vitamin D deficiency 10/21/2017  . DM type 2 with diabetic peripheral neuropathy ([place]) 10/21/2017  . DDD (degenerative disc disease), lumbar 10/21/2017  . Lumbar facet arthropathy 10/21/2017  . Lumbar facet syndrome (Bilateral) 10/21/2017  . Lumbar facet hypertrophy 10/21/2017  . Lumbar foraminal stenosis (L5-S1) (Right) 10/21/2017  . Osteoarthritis of knee (Bilateral) 10/21/2017  . Chronic musculoskeletal pain 10/21/2017  . Essential (primary) hypertension 10/12/2017  . Chronic ankle pain (Primary Area of Pain) (Left) 10/12/2017  . Chronic foot pain (Primary Area of Pain) (Left) 10/12/2017  . Chronic lower extremity pain (Secondary Area of Pain) (Bilateral) (L>R) 10/12/2017  . Chronic knee pain (Fourth Area of Pain) (Bilateral) (L>R) 10/12/2017  . Chronic low back pain [facility] Area of Pain) (Bilateral) (L>R) w/ sciatica (Left) 10/12/2017  . Chronic pain syndrome 10/12/2017  . Opiate use 10/12/2017  . Pharmacologic therapy 10/12/2017  . Disorder of skeletal system 10/12/2017  . Problems influencing health status 10/12/2017  . Neuropathy 08/11/2017  . Burning sensation of feet 07/06/2017  . Lower extremity numbness and tingling (Left) 07/06/2017  . Polyneuropathy associated with underlying disease ([place]) 05/29/2017  . DM type 2, uncontrolled, with neuropathy ([place]) 05/05/2017  . Neuropathic pain 06/29/2015  . GERD without esophagitis 01/16/2014   Janna Arch, PT, DPT   12/07/2018, 1:02 PM  [place] MAIN [facility] SERVICES [address] [place], [name], [address] Phone: [phone]   Fax:  [phone]  Name: Charlene [name] MRN: [id number] Date of Birth: Jan 08, 1961

## 2018-12-08 ENCOUNTER — Ambulatory Visit: Payer: Self-pay

## 2018-12-13 ENCOUNTER — Ambulatory Visit: Payer: BLUE CROSS/BLUE SHIELD

## 2018-12-13 ENCOUNTER — Ambulatory Visit: Payer: Self-pay

## 2018-12-15 ENCOUNTER — Ambulatory Visit: Payer: Self-pay

## 2018-12-20 ENCOUNTER — Ambulatory Visit: Payer: Self-pay

## 2018-12-20 ENCOUNTER — Other Ambulatory Visit: Payer: Self-pay

## 2018-12-20 ENCOUNTER — Ambulatory Visit: Payer: BLUE CROSS/BLUE SHIELD

## 2018-12-20 DIAGNOSIS — M79605 Pain in left leg: Secondary | ICD-10-CM

## 2018-12-20 DIAGNOSIS — M6281 Muscle weakness (generalized): Secondary | ICD-10-CM

## 2018-12-20 DIAGNOSIS — R2689 Other abnormalities of gait and mobility: Secondary | ICD-10-CM

## 2018-12-20 NOTE — Therapy (Signed)
[place] MAIN [facility] SERVICES [address] [place], [name], [address] Phone: [phone]   Fax:  [phone]  Physical Therapy Treatment  Patient Details  Name: Charlene [name] MRN: [id number] Date of Birth: 1960/10/09 Referring Provider (PT): potter Alroy Dust   Encounter Date: 12/20/2018  PT End of Session - 12/20/18 1917    Visit Number  3    Number of Visits  17    Date for PT Re-Evaluation  01/26/19    Authorization Type  eval date 12/02/18    PT Start Time  1442    PT Stop Time  1515    PT Time Calculation (min)  33 min    Equipment Utilized During Treatment  Gait belt    Activity Tolerance  Patient tolerated treatment well    Behavior During Therapy  [facility] for tasks assessed/performed       Past Medical History:  Diagnosis Date  . Arthritis    knees  . Degenerative disc disease, lumbar   . Diabetes mellitus without complication ([place])   . GERD (gastroesophageal reflux disease)   . Hypertension   . Neuropathy    left lower leg  . Polyneuropathy   . Polyneuropathy     Past Surgical History:  Procedure Laterality Date  . ABDOMINAL HYSTERECTOMY    . arthroscopic rotator cuff    . CATARACT EXTRACTION W/PHACO Right 11/25/2017   Procedure: CATARACT EXTRACTION PHACO AND INTRAOCULAR LENS PLACEMENT ([place]) RIGHT DIABETIC;  Surgeon: Leandrew Koyanagi, MD;  Location: [place];  Service: Ophthalmology;  Laterality: Right;  Diabetic - insulin and oral meds  . COLONOSCOPY    . endosopic sinus      There were no vitals filed for this visit.  Subjective Assessment - 12/20/18 1501    Subjective  Patient arrived late to session limiting session duration Reports she has been having some ankle/foot swelling and pain bilaterally.    Pertinent History  Patient is a pleasant 58 year old female returning to physical therapy after hospitalization for a mass on her brain. Referred for severe neuropathy, gait and balance training.   Patient last seen 09/10/18. 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 Per physician notes POC for brain mass:  repeat MRI in 6 months with aggressive treat of her blood sugars and blood pressure with potential for future of surgery.    Currently in Pain?  Yes    Pain Score  4     Pain Location  Foot    Pain Orientation  Right;Left    Pain Descriptors / Indicators  Aching    Pain Type  Chronic pain    Pain Onset  More than a month ago    Pain Frequency  Constant       Patient arrived late to session. Reports bilateral foot pain/swelling.   TherEx 2.5 ankle weights -marching in place 12x no UE support -hip abduction 10x each side no UE support  -hip extension 10x each side, no UE support, very challenging for stability  Seated ankle weights 2.5 lb: -marching 20x with arms crossed edge of chair with focus on slow steady velocity of movement for improved muscle recruitment.    Neuro Re-ed   airex pad: sit to stand from chair 10x; hands on knees, very challenging to patient; occasional Min A required   airex pad: modified tandem  stance 2x 30 seconds each foot position.   airex pad: marching ; challenging for single limb task requirement 12x each LE, focus on slow velocity for increased single limb stance.   ambulate in hallway with horizontal head turns reading playing cards. LLE excessively ER/toe out. 2x 40 ft with CGA and no AD   Ball toss in hallway with PT cueing at pelvis for weight shift for foot clearance 2x 86 ft    2" step toe taps 30 seconds for coordination/sequencing/ spatial awareness. 2 sets     Pt educated throughout session about proper posture and technique with exercises. Improved exercise technique, movement at target joints, use of target muscles after min to mod verbal, visual, tactile  cues.  Patient's session is limited due to her late arrival. Patient is challenged with sit to stand transfer on unstable surface requiring occasional min A for performance of transfer. Her LLE continues to challenge her more than her right however bilateral weakness is apparent. Pt will benefit from skilled PT services to address deficits in balance and strength in order to improve function and decrease fall risk                  PT Education - 12/20/18 1917    Education Details  exercise technique, body mechanics    Person(s) Educated  Patient    Methods  Explanation;Demonstration;Tactile cues;Verbal cues    Comprehension  Verbalized understanding;Returned demonstration;Verbal cues required;Tactile cues required       PT Short Term Goals - 12/02/18 0931      PT SHORT TERM GOAL #1   Title  Patient will be independent in home exercise program to improve strength/mobility for better functional independence with ADLs.     Time  4    Period  Weeks    Status  New    Target Date  12/30/18      PT SHORT TERM GOAL #2   Title  Patient will report a worst pain of 6/10 on the VAS during ambulation/functional mobility.    Baseline  12/02/18=8/10 BLE    Time  4    Period  Weeks    Status  New    Target Date  12/30/18      PT SHORT TERM GOAL #3   Title  Patient will increase lower extremity functional scale to >60/80 to demonstrate improved functional mobility and increased tolerance with ADLs.    Baseline  12/02/18=46/80    Time  4    Period  Weeks    Status  New    Target Date  12/30/18        PT Long Term Goals - 12/02/18 0932      PT LONG TERM GOAL #1   Title  Patient will report a worst pain of 3/10 on VAS with ambulation/functional mobility for improved quality of life.    Baseline  12/02/18= 8/10 BLE    Time  8    Period  Weeks    Status  New    Target Date  01/20/19      PT LONG TERM GOAL #2   Title  Patient will improve score on 10MWT to >0.56m/s to  improve independence with ambulation and to decrease risk of falls.    Baseline  12/02/18=. 46 m/sec    Time  8    Period  Weeks    Status  New    Target Date  01/20/19      PT LONG TERM GOAL #  3   Title  Patient will improve score on Berg Balance Test to at least 50/56 to demonstrate improved balance and safety with functional mobility.    Baseline  12/02/18 deferred to next visit due to patient being late to eval    Time  8    Period  Weeks    Status  New    Target Date  01/20/19      PT LONG TERM GOAL #4   Title  Patient will demonstrate BLE strength of 4-/5 to improve gait mechanics and safety with mobility    Baseline  12/02/18 =;limited by pain: grossly 2+/5 8/14: L 3/5 R 4-/5    Time  8    Period  Weeks    Status  New    Target Date  01/20/19      PT LONG TERM GOAL #5   Title  Patient will negotiate 4 steps with supervision using unilateral UE support (L railing ascending, R railing descending) to promote safety and independence in home environment.    Baseline  12/02/18 patient needs rail support bilaterally    Time  8    Period  Weeks    Status  New    Target Date  01/20/19      PT LONG TERM GOAL #6   Title  --    Baseline  --    Time  8    Period  Weeks    Status  New    Target Date  01/20/19      PT LONG TERM GOAL #7   Title  Patient will increase lower extremity functional scale to >60/80 to demonstrate improved functional mobility and increased tolerance with ADLs.    Baseline  12/02/18= 46/80    Time  8    Period  Weeks    Status  New    Target Date  01/20/19            Plan - 12/20/18 1920    Clinical Impression Statement  Patient's session is limited due to her late arrival. Patient is challenged with sit to stand transfer on unstable surface requiring occasional min A for performance of transfer. Her LLE continues to challenge her more than her right however bilateral weakness is apparent. Pt will benefit from skilled PT services to address  deficits in balance and strength in order to improve function and decrease fall risk    Personal Factors and Comorbidities  Comorbidity 1    Comorbidities  DM type II, severe diabetic neuropathy, HTN, arthritis, DDD, GERD, brain tumor    Examination-Activity Limitations  Bend;Caring for Others;Carry;Reach Overhead;Squat;Stairs;Stand;Transfers    Examination-Participation Restrictions  Cleaning;Community Activity;Driving;Meal Prep;Yard Work    [employer] officer  Evolving/Moderate complexity    Rehab Potential  Fair    Clinical Impairments Affecting Rehab Potential  (+) family support, motivation, age; (-) financial limitations, other medical comorbidities, chronicity    PT Frequency  1x / week    PT Duration  8 weeks    PT Treatment/Interventions  ADLs/Self Care Home Management;Aquatic Therapy;Biofeedback;Cryotherapy;Electrical Stimulation;Iontophoresis 4mg /ml Dexamethasone;Moist Heat;Traction;Ultrasound;DME Instruction;Gait training;Stair training;Functional mobility training;Therapeutic activities;Therapeutic exercise;Balance training;Neuromuscular re-education;Patient/family education;Cognitive remediation;Orthotic Fit/Training;Manual techniques;Passive range of motion;Compression bandaging;Energy conservation;Splinting;Taping;Visual/perceptual remediation/compensation;Dry needling    PT Next Visit Plan  continue with current program to progress strengthening and balance    PT Home Exercise Plan  LAQ, hip flex    Consulted and Agree with Plan of Care  Patient       Patient will benefit from skilled  therapeutic intervention in order to improve the following deficits and impairments:  Abnormal gait, Cardiopulmonary status limiting activity, Decreased activity tolerance, Decreased balance, Decreased endurance, Decreased coordination, Decreased knowledge of use of DME, Decreased mobility, Decreased strength, Difficulty walking, Increased muscle spasms, Impaired perceived functional  ability, Impaired flexibility, Increased edema, Decreased range of motion, Impaired vision/preception, Improper body mechanics, Postural dysfunction, Pain, Impaired sensation  Visit Diagnosis: Muscle weakness (generalized)  Other abnormalities of gait and mobility  Pain in left leg     Problem List Patient Active Problem List   Diagnosis Date Noted  . 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 Osteoarthritis involving multiple joints 08/04/2018  . Type 2 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, with long-term current use of insulin ([place]) 03/22/2018  . Abnormal EMG (07/06/2017) 11/23/2017  . Long-term insulin use ([place]) 11/10/2017  . Uncontrolled type 2 diabetes mellitus with hyperglycemia ([place]) 11/10/2017  . Vitamin D deficiency 10/21/2017  . DM type 2 with diabetic peripheral neuropathy ([place]) 10/21/2017  . DDD (degenerative disc disease), lumbar 10/21/2017  . Lumbar facet arthropathy 10/21/2017  . Lumbar facet syndrome (Bilateral) 10/21/2017  . Lumbar facet hypertrophy 10/21/2017  . Lumbar foraminal stenosis (L5-S1) (Right) 10/21/2017  . Osteoarthritis of knee (Bilateral) 10/21/2017  . Chronic musculoskeletal pain 10/21/2017  . Essential (primary) hypertension 10/12/2017  . Chronic ankle pain (Primary Area of Pain) (Left) 10/12/2017  . Chronic foot pain (Primary Area of Pain) (Left) 10/12/2017  . Chronic lower extremity pain (Secondary Area of Pain) (Bilateral) (L>R) 10/12/2017  . Chronic knee pain (Fourth Area of Pain) (Bilateral) (L>R) 10/12/2017  . Chronic low back pain [facility] Area of Pain) (Bilateral) (L>R) w/ sciatica (Left) 10/12/2017  . Chronic pain syndrome 10/12/2017  . Opiate use 10/12/2017  . Pharmacologic therapy 10/12/2017  . Disorder of skeletal system 10/12/2017  . Problems influencing health status 10/12/2017  . Neuropathy 08/11/2017  . Burning sensation of  feet 07/06/2017  . Lower extremity numbness and tingling (Left) 07/06/2017  . Polyneuropathy associated with underlying disease ([place]) 05/29/2017  . DM type 2, uncontrolled, with neuropathy ([place]) 05/05/2017  . Neuropathic pain 06/29/2015  . GERD without esophagitis 01/16/2014   Janna Arch, PT, DPT   12/20/2018, 7:22 PM  [place] MAIN [facility] SERVICES [address] [place], [name], [address] Phone: [phone]   Fax:  [phone]  Name: Charlene [name] MRN: [id number] Date of Birth: Oct 14, 1960

## 2018-12-22 ENCOUNTER — Ambulatory Visit: Payer: Self-pay

## 2018-12-23 ENCOUNTER — Other Ambulatory Visit: Payer: Self-pay

## 2018-12-23 ENCOUNTER — Emergency Department
Admission: EM | Admit: 2018-12-23 | Discharge: 2018-12-23 | Disposition: A | Discharge status: Home / Self Care | Payer: BLUE CROSS/BLUE SHIELD | Attending: Emergency Medicine | Admitting: Emergency Medicine

## 2018-12-23 ENCOUNTER — Ambulatory Visit: Payer: BLUE CROSS/BLUE SHIELD | Attending: Neurology

## 2018-12-23 DIAGNOSIS — I10 Essential (primary) hypertension: Secondary | ICD-10-CM | POA: Insufficient documentation

## 2018-12-23 DIAGNOSIS — E119 Type 2 diabetes mellitus without complications: Secondary | ICD-10-CM | POA: Diagnosis not present

## 2018-12-23 DIAGNOSIS — Z79899 Other long term (current) drug therapy: Secondary | ICD-10-CM | POA: Insufficient documentation

## 2018-12-23 DIAGNOSIS — M6281 Muscle weakness (generalized): Secondary | ICD-10-CM | POA: Insufficient documentation

## 2018-12-23 DIAGNOSIS — Z794 Long term (current) use of insulin: Secondary | ICD-10-CM | POA: Insufficient documentation

## 2018-12-23 DIAGNOSIS — R2689 Other abnormalities of gait and mobility: Secondary | ICD-10-CM | POA: Insufficient documentation

## 2018-12-23 LAB — CBC
HCT: 37.4 % (ref 36.0–46.0)
Hemoglobin: 12 g/dL (ref 12.0–15.0)
MCH: 28.3 pg (ref 26.0–34.0)
MCHC: 32.1 g/dL (ref 30.0–36.0)
MCV: 88.2 fL (ref 80.0–100.0)
Platelets: 311 10*3/uL (ref 150–400)
RBC: 4.24 MIL/uL (ref 3.87–5.11)
RDW: 12.1 % (ref 11.5–15.5)
WBC: 9.5 10*3/uL (ref 4.0–10.5)
nRBC: 0 % (ref 0.0–0.2)

## 2018-12-23 LAB — BASIC METABOLIC PANEL
Anion gap: 9 (ref 5–15)
BUN: 13 mg/dL (ref 6–20)
CO2: 28 mmol/L (ref 22–32)
Calcium: 9.8 mg/dL (ref 8.9–10.3)
Chloride: 100 mmol/L (ref 98–111)
Creatinine, Ser: 0.59 mg/dL (ref 0.44–1.00)
GFR calc Af Amer: >60 mL/min (ref >60)
GFR calc non Af Amer: >60 mL/min (ref >60)
Glucose, Bld: 175 mg/dL — ABNORMAL HIGH (ref 70–99)
Potassium: 4.1 mmol/L (ref 3.5–5.1)
Sodium: 137 mmol/L (ref 135–145)

## 2018-12-23 LAB — TROPONIN I (HIGH SENSITIVITY)
Troponin I (High Sensitivity): 3 ng/L (ref <18)
Troponin I (High Sensitivity): 4 ng/L (ref <18)

## 2018-12-23 MED ORDER — LABETALOL HCL 5 MG/ML IV SOLN
10.0000 mg | Freq: Once | INTRAVENOUS | Status: AC
  Administered 2018-12-23: 10 mg via INTRAVENOUS
  Filled 2018-12-23: qty 4

## 2018-12-23 MED ORDER — CLONIDINE HCL 0.1 MG PO TABS
0.2000 mg | ORAL_TABLET | Freq: Once | ORAL | Status: AC
  Administered 2018-12-23: 0.2 mg via ORAL
  Filled 2018-12-23: qty 2

## 2018-12-23 NOTE — ED Triage Notes (Signed)
Pt to the er today for HTN. Pt went to therapy today and BP was high. Pt reports last week at MD office her BP is sine. Pt currently takes, lisinopril, amlodipine, carvedilol, hydralazine. Pt reports headache but no dizziness.

## 2018-12-23 NOTE — Therapy (Signed)
[place] MAIN [facility] SERVICES [address] [place], [name], [address] Phone: [phone]   Fax:  [phone]  Physical Therapy Treatment  Patient Details  Name: Charlene [name] MRN: [id number] Date of Birth: 08-14-1960 Referring Provider (PT): potter Alroy Dust   Encounter Date: 12/23/2018  PT End of Session - 12/23/18 1556    Visit Number  3    Number of Visits  17    Date for PT Re-Evaluation  01/26/19    Authorization Type  eval date 12/02/18    Equipment Utilized During Treatment  Gait belt    Activity Tolerance  Patient tolerated treatment well    Behavior During Therapy  [facility] for tasks assessed/performed       Past Medical History:  Diagnosis Date  . Arthritis    knees  . Degenerative disc disease, lumbar   . Diabetes mellitus without complication ([place])   . GERD (gastroesophageal reflux disease)   . Hypertension   . Neuropathy    left lower leg  . Polyneuropathy   . Polyneuropathy     Past Surgical History:  Procedure Laterality Date  . ABDOMINAL HYSTERECTOMY    . arthroscopic rotator cuff    . CATARACT EXTRACTION W/PHACO Right 11/25/2017   Procedure: CATARACT EXTRACTION PHACO AND INTRAOCULAR LENS PLACEMENT ([place]) RIGHT DIABETIC;  Surgeon: Leandrew Koyanagi, MD;  Location: [place];  Service: Ophthalmology;  Laterality: Right;  Diabetic - insulin and oral meds  . COLONOSCOPY    . endosopic sinus      There were no vitals filed for this visit.      Patient reports feeling a little "whoozy" today. Patient reports her blood sugar was fine when she checked it before she left for her PT session. Patient reports she took her blood pressure medication   Vitals.:  202/95 pulse 86 L arm  R arm: 194/115 pulse 87      Patient taken to ED due to BP with concurrent history of brain mass.                     PT Short Term Goals - 12/02/18 0931      PT SHORT TERM GOAL #1   Title  Patient  will be independent in home exercise program to improve strength/mobility for better functional independence with ADLs.     Time  4    Period  Weeks    Status  New    Target Date  12/30/18      PT SHORT TERM GOAL #2   Title  Patient will report a worst pain of 6/10 on the VAS during ambulation/functional mobility.    Baseline  12/02/18=8/10 BLE    Time  4    Period  Weeks    Status  New    Target Date  12/30/18      PT SHORT TERM GOAL #3   Title  Patient will increase lower extremity functional scale to >60/80 to demonstrate improved functional mobility and increased tolerance with ADLs.    Baseline  12/02/18=46/80    Time  4    Period  Weeks    Status  New    Target Date  12/30/18        PT Long Term Goals - 12/02/18 0932      PT LONG TERM GOAL #1   Title  Patient will report a worst pain of 3/10 on VAS with ambulation/functional mobility for improved quality of life.  Baseline  12/02/18= 8/10 BLE    Time  8    Period  Weeks    Status  New    Target Date  01/20/19      PT LONG TERM GOAL #2   Title  Patient will improve score on 10MWT to >0.60m/s to improve independence with ambulation and to decrease risk of falls.    Baseline  12/02/18=. 46 m/sec    Time  8    Period  Weeks    Status  New    Target Date  01/20/19      PT LONG TERM GOAL #3   Title  Patient will improve score on Berg Balance Test to at least 50/56 to demonstrate improved balance and safety with functional mobility.    Baseline  12/02/18 deferred to next visit due to patient being late to eval    Time  8    Period  Weeks    Status  New    Target Date  01/20/19      PT LONG TERM GOAL #4   Title  Patient will demonstrate BLE strength of 4-/5 to improve gait mechanics and safety with mobility    Baseline  12/02/18 =;limited by pain: grossly 2+/5 8/14: L 3/5 R 4-/5    Time  8    Period  Weeks    Status  New    Target Date  01/20/19      PT LONG TERM GOAL #5   Title  Patient will negotiate 4  steps with supervision using unilateral UE support (L railing ascending, R railing descending) to promote safety and independence in home environment.    Baseline  12/02/18 patient needs rail support bilaterally    Time  8    Period  Weeks    Status  New    Target Date  01/20/19      PT LONG TERM GOAL #6   Title  --    Baseline  --    Time  8    Period  Weeks    Status  New    Target Date  01/20/19      PT LONG TERM GOAL #7   Title  Patient will increase lower extremity functional scale to >60/80 to demonstrate improved functional mobility and increased tolerance with ADLs.    Baseline  12/02/18= 46/80    Time  8    Period  Weeks    Status  New    Target Date  01/20/19              Patient will benefit from skilled therapeutic intervention in order to improve the following deficits and impairments:     Visit Diagnosis: Muscle weakness (generalized)  Other abnormalities of gait and mobility     Problem List Patient Active Problem List   Diagnosis Date Noted  . 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 Osteoarthritis involving multiple joints 08/04/2018  . Type 2 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, with long-term current use of insulin ([place]) 03/22/2018  . Abnormal EMG (07/06/2017) 11/23/2017  . Long-term insulin use ([place]) 11/10/2017  . Uncontrolled type 2 diabetes mellitus with hyperglycemia ([place]) 11/10/2017  . Vitamin D deficiency 10/21/2017  . DM type 2 with diabetic peripheral neuropathy ([place]) 10/21/2017  . DDD (degenerative disc disease), lumbar 10/21/2017  . Lumbar facet arthropathy 10/21/2017  . Lumbar facet syndrome (Bilateral) 10/21/2017  . Lumbar facet  hypertrophy 10/21/2017  . Lumbar foraminal stenosis (L5-S1) (Right) 10/21/2017  . Osteoarthritis of knee (Bilateral) 10/21/2017  . Chronic musculoskeletal pain 10/21/2017  . Essential (primary)  hypertension 10/12/2017  . Chronic ankle pain (Primary Area of Pain) (Left) 10/12/2017  . Chronic foot pain (Primary Area of Pain) (Left) 10/12/2017  . Chronic lower extremity pain (Secondary Area of Pain) (Bilateral) (L>R) 10/12/2017  . Chronic knee pain (Fourth Area of Pain) (Bilateral) (L>R) 10/12/2017  . Chronic low back pain [facility] Area of Pain) (Bilateral) (L>R) w/ sciatica (Left) 10/12/2017  . Chronic pain syndrome 10/12/2017  . Opiate use 10/12/2017  . Pharmacologic therapy 10/12/2017  . Disorder of skeletal system 10/12/2017  . Problems influencing health status 10/12/2017  . Neuropathy 08/11/2017  . Burning sensation of feet 07/06/2017  . Lower extremity numbness and tingling (Left) 07/06/2017  . Polyneuropathy associated with underlying disease ([place]) 05/29/2017  . DM type 2, uncontrolled, with neuropathy ([place]) 05/05/2017  . Neuropathic pain 06/29/2015  . GERD without esophagitis 01/16/2014   Janna Arch, PT, DPT   12/23/2018, 3:56 PM  [place] MAIN [facility] SERVICES [address] [place], [name], [address] Phone: [phone]   Fax:  [phone]  Name: Charlene [name] MRN: [id number] Date of Birth: 05/08/1960

## 2018-12-23 NOTE — ED Provider Notes (Signed)
[facility] Emergency Department Provider Note ____________________________________________   First MD Initiated Contact with Patient 12/23/18 1838     (approximate)  I have reviewed the triage vital signs and the nursing notes.   HISTORY  Chief Complaint Hypertension    HPI Charlene [name] is a 58 y.o. female with PMH as noted below including history of hypertension who presents with elevated blood pressure.  The patient states that she felt a pressure in her head, but not specifically a headache.  She states that she then checked her blood pressure few times and got readings of 210s/100s.  The patient states that she has been compliant with her multiple blood pressure medications, lisinopril, amlodipine, carvedilol, and hydralazine.  She reports that she had a significant stressor today when she found out that a bill for repair of her car will be $2800.  She denies chest pain, severe headache, or other acute symptoms.  Past Medical History:  Diagnosis Date  . Arthritis    knees  . Degenerative disc disease, lumbar   . Diabetes mellitus without complication ([place])   . GERD (gastroesophageal reflux disease)   . Hypertension   . Neuropathy    left lower leg  . Polyneuropathy   . Polyneuropathy     Patient Active Problem List   Diagnosis Date Noted  . 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 Osteoarthritis involving multiple joints 08/04/2018  . Type 2 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, with long-term current use of insulin ([place]) 03/22/2018  . Abnormal EMG (07/06/2017) 11/23/2017  . Long-term insulin use ([place]) 11/10/2017  . Uncontrolled type 2 diabetes mellitus with hyperglycemia ([place]) 11/10/2017  . Vitamin D deficiency 10/21/2017  . DM type 2 with diabetic peripheral neuropathy ([place]) 10/21/2017  . DDD (degenerative disc disease), lumbar  10/21/2017  . Lumbar facet arthropathy 10/21/2017  . Lumbar facet syndrome (Bilateral) 10/21/2017  . Lumbar facet hypertrophy 10/21/2017  . Lumbar foraminal stenosis (L5-S1) (Right) 10/21/2017  . Osteoarthritis of knee (Bilateral) 10/21/2017  . Chronic musculoskeletal pain 10/21/2017  . Essential (primary) hypertension 10/12/2017  . Chronic ankle pain (Primary Area of Pain) (Left) 10/12/2017  . Chronic foot pain (Primary Area of Pain) (Left) 10/12/2017  . Chronic lower extremity pain (Secondary Area of Pain) (Bilateral) (L>R) 10/12/2017  . Chronic knee pain (Fourth Area of Pain) (Bilateral) (L>R) 10/12/2017  . Chronic low back pain [facility] Area of Pain) (Bilateral) (L>R) w/ sciatica (Left) 10/12/2017  . Chronic pain syndrome 10/12/2017  . Opiate use 10/12/2017  . Pharmacologic therapy 10/12/2017  . Disorder of skeletal system 10/12/2017  . Problems influencing health status 10/12/2017  . Neuropathy 08/11/2017  . Burning sensation of feet 07/06/2017  . Lower extremity numbness and tingling (Left) 07/06/2017  . Polyneuropathy associated with underlying disease ([place]) 05/29/2017  . DM type 2, uncontrolled, with neuropathy ([place]) 05/05/2017  . Neuropathic pain 06/29/2015  . GERD without esophagitis 01/16/2014    Past Surgical History:  Procedure Laterality Date  . ABDOMINAL HYSTERECTOMY    . arthroscopic rotator cuff    . CATARACT EXTRACTION W/PHACO Right 11/25/2017   Procedure: CATARACT EXTRACTION PHACO AND INTRAOCULAR LENS PLACEMENT ([place]) RIGHT DIABETIC;  Surgeon: Leandrew Koyanagi, MD;  Location: [place];  Service: Ophthalmology;  Laterality: Right;  Diabetic - insulin and oral meds  . COLONOSCOPY    . endosopic sinus      Prior to Admission medications   Medication  Sig Start Date End Date Taking? Authorizing Provider  amLODipine (NORVASC) 2.5 MG tablet Take 2.5 mg by mouth daily.  02/03/18 02/03/19  [provider]  baclofen (LIORESAL) 10 MG tablet Take 1  tablet (10 mg total) by mouth 3 (three) times daily. 08/20/18 02/16/19  Milinda Pointer, MD  Calcium Carbonate-Vit D-Min (GNP CALCIUM 1200) 1200-1000 MG-UNIT CHEW Chew 1,200 mg by mouth daily with breakfast. Take in combination with vitamin D and magnesium. 08/20/18 02/16/19  Milinda Pointer, MD  carvedilol (COREG) 25 MG tablet Take 25 mg by mouth 2 (two) times daily with a meal.  09/15/18   [provider]  Cholecalciferol (VITAMIN D3) 125 MCG (5000 UT) CAPS Take 1 capsule (5,000 Units total) by mouth daily with breakfast. Take along with calcium and magnesium. 08/20/18 02/16/19  Milinda Pointer, MD  dicyclomine (BENTYL) 10 MG capsule Take 10 mg by mouth 4 (four) times daily.    [provider]  DULoxetine (CYMBALTA) 30 MG capsule TAKE 1 TABLET BY MOUTH TWICE DAILY 08/30/18   [provider]  gabapentin (NEURONTIN) 800 MG tablet Take 1 tablet (800 mg total) by mouth 4 (four) times daily. 08/20/18 02/16/19  Milinda Pointer, MD  Insulin Glargine, 2 Unit Dial, (TOUJEO MAX SOLOSTAR) 300 UNIT/ML SOPN Inject 140 Units into the skin daily. 40 units AM and 110 units HS 10/07/18   Fritzi Mandes, MD  Lidocaine-Menthol (ICY HOT LIDOCAINE PLUS MENTHOL EX) Apply 1 application topically.    [provider]  liraglutide (VICTOZA) 18 MG/3ML SOPN Inject 1.8 mg into the skin daily.    [provider]  lisinopril (PRINIVIL,ZESTRIL) 40 MG tablet Take 40 mg by mouth daily.    [provider]  Magnesium 500 MG CAPS Take 1 capsule (500 mg total) by mouth 2 (two) times daily at 8 am and 10 pm. 08/20/18 02/16/19  Milinda Pointer, MD  meloxicam (MOBIC) 15 MG tablet Take 1 tablet (15 mg total) by mouth daily. 08/20/18 02/16/19  Milinda Pointer, MD  pantoprazole (PROTONIX) 40 MG tablet Take 40 mg by mouth 2 (two) times daily.    [provider]  polyethylene glycol (MIRALAX / GLYCOLAX) packet Take 17 g by mouth daily. Mix one tablespoon with 8oz of your favorite  juice or water every day until you are having soft formed stools. Then start taking once daily if you didn't have a stool the day before. Patient taking differently: Take 17 g by mouth daily as needed. Mix one tablespoon with 8oz of your favorite juice or water every day until you are having soft formed stools. Then start taking once daily if you didn't have a stool the day before. 12/17/17   Merlyn Lot, MD  traMADol (ULTRAM) 50 MG tablet Take 1 tablet (50 mg total) by mouth every 8 (eight) hours as needed for severe pain. 08/20/18 02/16/19  Milinda Pointer, MD    Allergies Patient has no known allergies.  Family History  Problem Relation Age of Onset  . Breast cancer Maternal Aunt   . Cancer Father   . Alcohol abuse Father     Social History Social History   Tobacco Use  . Smoking status: Never Smoker  . Smokeless tobacco: Never Used  Substance Use Topics  . Alcohol use: Not Currently    Alcohol/week: 0.0 - 1.0 standard drinks  . Drug use: No    Review of Systems  Constitutional: No fever. Eyes: No visual changes. ENT: No sore throat. Cardiovascular: Denies chest pain. Respiratory: Denies shortness of  breath. Gastrointestinal: No vomiting or diarrhea.  Genitourinary: Negative for dysuria.  Musculoskeletal: Negative for back pain. Skin: Negative for rash. Neurological: Negative for headache.   ____________________________________________   PHYSICAL EXAM:  VITAL SIGNS: ED Triage Vitals  Enc Vitals Group     BP 12/23/18 1555 (!) 197/100     Pulse Rate 12/23/18 1555 88     Resp 12/23/18 1555 18     Temp 12/23/18 1555 99 F (37.2 C)     Temp Source 12/23/18 1555 Oral     SpO2 12/23/18 1555 100 %     Weight 12/23/18 1557 125 lb (56.7 kg)     Height 12/23/18 1557 4\' 11"  (1.499 m)     Head Circumference --      Peak Flow --      Pain Score 12/23/18 1556 6     Pain Loc --      Pain Edu? --      Excl. in [place]? --     Constitutional: Alert and oriented.  Well appearing and in no acute distress. Eyes: Conjunctivae are normal.  Head: Atraumatic. Nose: No congestion/rhinnorhea. Mouth/Throat: Mucous membranes are moist.   Neck: Normal range of motion.  Cardiovascular: Normal rate, regular rhythm.   Good peripheral circulation. Respiratory: Normal respiratory effort.  No retractions.  Gastrointestinal: No distention.  Musculoskeletal: No lower extremity edema.  Extremities warm and well perfused.  Neurologic:  Normal speech and language. No gross focal neurologic deficits are appreciated.  Skin:  Skin is warm and dry. No rash noted. Psychiatric: Mood and affect are normal. Speech and behavior are normal.  ____________________________________________   LABS (all labs ordered are listed, but only abnormal results are displayed)  Labs Reviewed  BASIC METABOLIC PANEL - Abnormal; Notable for the following components:      Result Value   Glucose, Bld 175 (*)    All other components within normal limits  CBC  TROPONIN I (HIGH SENSITIVITY)  TROPONIN I (HIGH SENSITIVITY)   ____________________________________________  EKG  ED ECG REPORT I, Arta Silence, the attending physician, personally viewed and interpreted this ECG.  Date: 12/23/2018 EKG Time: 1612 Rate: 84 Rhythm: normal sinus rhythm QRS Axis: normal Intervals: normal ST/T Wave abnormalities: normal Narrative Interpretation: no evidence of acute ischemia  ____________________________________________  RADIOLOGY    ____________________________________________   PROCEDURES  Procedure(s) performed: No  Procedures  Critical Care performed: No ____________________________________________   INITIAL IMPRESSION / ASSESSMENT AND PLAN / ED COURSE  Pertinent labs & imaging results that were available during my care of the patient were reviewed by me and considered in my medical decision making (see chart for details).  58 year old female with PMH as noted above  including history of hypertension on multiple medications presents with elevated blood pressure readings and some pressure in her head today.  The patient denies significant headache, and has no chest pain or other acute symptoms.  She does report a significant stressor today and believes that her pressure is up because of this.  She states that when she was seen by her PMD several days ago, her blood pressure was normal.  She states she is compliant with her medications.  On exam, the patient is well-appearing.  Her vital signs are normal except for elevated blood pressure.  Neurologic exam is nonfocal and the rest of the physical exam is unremarkable.  Overall I suspect most likely elevated blood pressure due to the patient's endorse stressors and I am reassured by the fact that she had  a normal blood pressure when she was seen by her PMD quite recently.  At this time, the patient has no signs or symptoms of endorgan damage or dysfunction.  We have obtained basic labs and troponins x2, all of which are within normal limits.  I will give clonidine to hopefully achieve a relatively rapid response in the blood pressure, and if it improves, the patient will be stable for discharge home.  ----------------------------------------- 9:25 PM on 12/23/2018 -----------------------------------------  Blood pressure improved after the clonidine to the 180s over 90s and the patient was feeling well.  However, on recheck prior to discharge the blood pressure rose again above [id number] systolic.  I gave a dose of IV labetalol, and now the blood pressure is significantly improved.  The patient is asymptomatic.  There is no evidence of hypertensive crisis.  She is stable for discharge.  I instructed her to follow-up with her PMD, and have someone recheck the blood pressure over the next 1 to 2 days.  I gave her very thorough return precautions and she expressed understanding.  ____________________________________________    FINAL CLINICAL IMPRESSION(S) / ED DIAGNOSES  Final diagnoses:  Hypertension, unspecified type      NEW MEDICATIONS STARTED DURING THIS VISIT:  New Prescriptions   No medications on file     Note:  This document was prepared using Dragon voice recognition software and may include unintentional dictation errors.    Arta Silence, MD 12/23/18 2126

## 2018-12-23 NOTE — Discharge Instructions (Addendum)
Continue to take all of your normal blood pressure medications as prescribed.  Call your doctor's office tomorrow to let them know that you were seen in the ER for elevated blood pressure and to arrange for follow-up and/or a blood pressure recheck.  Return to the ER for any new or worsening pressure in your head or severe headache, chest pain, weakness, or any other new or worsening symptoms that concern you.

## 2018-12-23 NOTE — ED Triage Notes (Signed)
First Nurse Note: Arrives for ED evaluation of HTN.  Patient has history of brain mass.  AAOx3. Skin warm and dry.  MAE equally and strong.  NAD

## 2019-01-10 ENCOUNTER — Ambulatory Visit: Payer: BLUE CROSS/BLUE SHIELD

## 2019-01-12 ENCOUNTER — Ambulatory Visit: Payer: BLUE CROSS/BLUE SHIELD

## 2019-01-17 ENCOUNTER — Ambulatory Visit: Payer: BLUE CROSS/BLUE SHIELD

## 2019-01-19 ENCOUNTER — Ambulatory Visit: Payer: BLUE CROSS/BLUE SHIELD

## 2019-01-24 ENCOUNTER — Ambulatory Visit: Payer: BLUE CROSS/BLUE SHIELD

## 2019-01-26 ENCOUNTER — Ambulatory Visit: Payer: BLUE CROSS/BLUE SHIELD

## 2019-01-31 ENCOUNTER — Ambulatory Visit: Payer: BLUE CROSS/BLUE SHIELD | Admitting: Dietician

## 2019-01-31 ENCOUNTER — Ambulatory Visit: Payer: BLUE CROSS/BLUE SHIELD

## 2019-02-01 ENCOUNTER — Encounter: Payer: Self-pay | Admitting: Dietician

## 2019-02-01 NOTE — Progress Notes (Signed)
Patient cancelled her initial MNT appointment for 01/31/19, and stated she would reschedule later when available. Sent notification to referring provider.

## 2019-02-10 ENCOUNTER — Ambulatory Visit: Payer: BLUE CROSS/BLUE SHIELD

## 2019-02-14 ENCOUNTER — Encounter: Payer: BLUE CROSS/BLUE SHIELD | Admitting: Pain Medicine

## 2019-02-15 ENCOUNTER — Encounter: Payer: Self-pay | Admitting: Pain Medicine

## 2019-02-15 NOTE — Progress Notes (Signed)
Patient: Charlene [name]  Service Category: E/M  Provider: Gaspar Cola, MD  DOB: 06/05/1960  DOS: 02/16/2019  Location: Office  MRN: [id number]  Setting: Ambulatory outpatient  Referring Provider: Tracie Harrier, MD  Type: Established Patient  Specialty: Interventional Pain Management  PCP: Tracie Harrier, MD  Location: Remote location  Delivery: TeleHealth     Virtual Encounter - Pain Management PROVIDER NOTE: Information contained herein reflects review and annotations entered in association with encounter. Interpretation of such information and data should be left to medically-trained personnel. Information provided to patient can be located elsewhere in the medical record under "Patient Instructions". Document created using STT-dictation technology, any transcriptional errors that may result from process are unintentional.    Contact & Pharmacy Preferred: [phone] Home: [phone] (home) Mobile: [phone] (mobile) E-mail: [email or web address], [place] [address] [name] [address] Phone: [phone] Fax: [phone]   Pre-screening  Charlene [name] offered "in-person" vs "virtual" encounter. She indicated preferring virtual for this encounter.   Reason COVID-19*  Social distancing based on CDC and AMA recommendations.   I contacted Charlene [name] on 02/16/2019 via telephone.      I clearly identified myself as Gaspar Cola, MD. I verified that I was speaking with the correct person using two identifiers (Name: Charlene [name], and date of birth: 09-08-1960).  Consent I sought verbal advanced consent from Charlene [name] for virtual visit interactions. I informed Charlene [name] of possible security and privacy concerns, risks, and limitations associated with providing "not-in-person" medical evaluation and management services. I also informed Charlene [name] of the availability of "in-person" appointments.  Finally, I informed her that there would be a charge for the virtual visit and that she could be  personally, fully or partially, financially responsible for it. Charlene [name] expressed understanding and agreed to proceed.   Historic Elements   Charlene [name] is a 59 y.o. year old, female patient evaluated today after her last encounter by our practice on 10/06/2018. Charlene [name]  has a past medical history of Arthritis, Degenerative disc disease, lumbar, Diabetes mellitus without complication ([place]), GERD (gastroesophageal reflux disease), Hypertension, Neuropathy, Polyneuropathy, and Polyneuropathy. She also  has a past surgical history that includes arthroscopic rotator cuff; Colonoscopy; Abdominal hysterectomy; endosopic sinus; and Cataract extraction w/PHACO (Right, 11/25/2017). Charlene [name] has a current medication list which includes the following prescription(s): amlodipine, baclofen, carvedilol, vitamin d3, xigduo xr, dicyclomine, duloxetine, gabapentin, toujeo max solostar, lidocaine-menthol, lisinopril, magnesium, metformin, pantoprazole, polyethylene glycol, pregabalin, sucralfate, and tramadol. She  reports that she has never smoked. She has never used smokeless tobacco. She reports previous alcohol use. She reports that she does not use drugs. Charlene [name] has No Known Allergies.   HPI  Today, she is being contacted for medication management.  The patient indicates doing well with the current medication regimen. No adverse reactions or side effects reported to the medications.  The patient indicates that she is having some swelling and discomfort in her legs but when she was asked if she wanted [place] to bring her in for any kind of shots she indicated that she wants to hold for now.  Pharmacotherapy Assessment  Analgesic: Tramadol 50 mg, 1 tab PO q 8 hrs (150 mg/day of tramadol) MME/day: 15mg /day.   Monitoring: Pharmacotherapy: No side-effects or adverse reactions reported. [place] PMP: PDMP reviewed  during this encounter.       Compliance: No problems identified. Effectiveness: Clinically acceptable. Plan:  Refer to "POC".  UDS:  Summary  Date Value Ref Range Status  10/12/2017 FINAL  Final    Comment:    ==================================================================== [facility] COMP DRUG ANALYSIS,UR ==================================================================== Test                             Result       Flag       Units Drug Present and Declared for Prescription Verification   Tramadol                       [id number]       EXPECTED   ng/mg creat   O-Desmethyltramadol            8266         EXPECTED   ng/mg creat   N-Desmethyltramadol            2066         EXPECTED   ng/mg creat    Source of tramadol is a prescription medication.    O-desmethyltramadol and N-desmethyltramadol are expected    metabolites of tramadol.   Gabapentin                     PRESENT      EXPECTED   Baclofen                       PRESENT      EXPECTED   Metoprolol                     PRESENT      EXPECTED Drug Present not Declared for Prescription Verification   Alcohol, Ethyl                 0.154        UNEXPECTED g/dL    Sources of ethyl alcohol include alcoholic beverages or as a    fermentation product of glucose; glucose is present in this    specimen.  Interpret result with caution, as the presence of    ethyl alcohol is likely due, at least in part, to fermentation of    glucose.   Naproxen                       PRESENT      UNEXPECTED Drug Absent but Declared for Prescription Verification   Tizanidine                     Not Detected UNEXPECTED    Tizanidine, as indicated in the declared medication list, is not    always detected even when used as directed.   Duloxetine                     Not Detected UNEXPECTED   Lidocaine                      Not Detected UNEXPECTED    Lidocaine, as indicated in the declared medication list, is not    always detected even when used as  directed. ==================================================================== Test                      Result    Flag   Units      Ref Range   Creatinine  47               mg/dL      >=20 ==================================================================== Declared Medications:  The flagging and interpretation on this report are based on the  following declared medications.  Unexpected results may arise from  inaccuracies in the declared medications.  **Note: The testing scope of this panel includes these medications:  Baclofen  Duloxetine  Gabapentin  Metoprolol  Tramadol  **Note: The testing scope of this panel does not include small to  moderate amounts of these reported medications:  Lidocaine  Tizanidine  **Note: The testing scope of this panel does not include following  reported medications:  Fluticasone  Hydrochlorothiazide (Lisinopril-HCTZ)  Insulin  Lisinopril (Lisinopril-HCTZ)  Metformin  Omeprazole  Pantoprazole  Potassium ==================================================================== For clinical consultation, please call [phone]. ====================================================================    Laboratory Chemistry Profile (12 mo)  Renal: 12/23/2018: BUN 13; Creatinine, Ser 0.59  Lab Results  Component Value Date   GFRAA >60 12/23/2018   GFRNONAA >60 12/23/2018   Hepatic: 10/21/2018: Albumin 3.6 Lab Results  Component Value Date   AST 17 10/21/2018   ALT 21 10/21/2018   Other: No results found for requested labs within last 8760 hours.  Note: Above Lab results reviewed.  Imaging  CT ANGIO HEAD W OR WO CONTRAST CLINICAL DATA:  Headache, new, malignancy suspected.  EXAM: CT ANGIOGRAPHY HEAD  TECHNIQUE: Multidetector CT imaging of the head was performed using the standard protocol during bolus administration of intravenous contrast. Multiplanar CT image reconstructions and MIPs were obtained to evaluate the  vascular anatomy.  CONTRAST:  50mL OMNIPAQUE IOHEXOL 350 MG/ML SOLN  COMPARISON:  Brain MRI 10/06/2018, head CT 10/05/2018  FINDINGS: CTA HEAD  A large extra-axial enhancing mass extending cephalad from the right sphenoid wing was more fully characterized on brain MRI performed earlier the same day 10/06/2018.  Anterior circulation: The bilateral intracranial internal carotid arteries are patent without significant stenosis. The mass abuts portions of the cavernous right ICA. Additionally, the mass abuts and may partially encase the supraclinoid right ICA and the origin of the hypoplastic A1 right anterior cerebral artery. Beyond the hypoplastic A1 segment, the right anterior cerebral artery is patent without significant proximal stenosis. The mass also appears to abut and may partially encase the origin of a fetal right posterior cerebral artery without high-grade vessel narrowing. Additionally, the mass abuts and uplifts, and may partially encase the M1 right middle cerebral artery. No apparent high-grade narrowing of the M1 right middle cerebral artery. The left middle and anterior cerebral arteries are patent without significant proximal stenosis.  Posterior circulation: The intracranial vertebral arteries are patent without significant stenosis. The left vertebral artery is hypoplastic beyond the origin of the left PICA. The basilar artery is patent without significant stenosis. Fetal origin right posterior cerebral artery as described. The right posterior cerebral artery is patent without significant proximal stenosis. The left posterior cerebral artery is patent without significant proximal stenosis.  No intracranial aneurysm is identified.  Venous sinuses: Evaluation limited due to contrast bolus timing.  Anatomic variants: As described.  IMPRESSION: A large extra-axial enhancing mass extending cephalad from the right sphenoid wing was more fully characterized on  brain MRI performed earlier the same day 10/06/2018.  The mass abuts and may partially encase portions of the supraclinoid right internal carotid artery, the M1 right middle cerebral artery, the origin of a hypoplastic A1 anterior cerebral artery, and the origin of a fetal right posterior cerebral artery. Please refer to the  body of the report for full description. Evaluation for narrowing of the hypoplastic A1 ACA is limited due to small vessel size. Otherwise, no high-grade arterial stenosis is identified. No intracranial large vessel occlusion  The mass also abuts portions of the cavernous right ICA.  Electronically Signed   By: Kellie Simmering   On: 10/06/2018 16:58 MR Brain W and Wo Contrast CLINICAL DATA:  59 year old female with headache and dizziness, partially calcified right middle cranial fossa mass on CT earlier tonight.  EXAM: MRI HEAD WITHOUT AND WITH CONTRAST  TECHNIQUE: Multiplanar, multiecho pulse sequences of the brain and surrounding structures were obtained without and with intravenous contrast.  CONTRAST:  52mL GADAVIST GADOBUTROL 1 MMOL/ML IV SOLN  COMPARISON:  Head CT 10/05/2018.  FINDINGS: Study is intermittently degraded by motion artifact despite repeated imaging attempts.  Brain: An extra-axial appearing mass arising cephalad from the right sphenoid wing demonstrates isointense precontrast T1 signal and enhances homogeneously encompassing 38 x 44 by 43 millimeters (AP by transverse by CC). The mass has mildly lobulated margins, and abuts the right ICA terminus as seen on series 27, image 10. The right M1 is elevated by the mass (series 23, image 21). The mass abuts but does not definitely invade the right cavernous sinus or pituitary. The mass also abuts the right orbital apex.  There is associated mass effect on the right inferior frontal gyrus and right anterior temporal lobe with-T2 and FLAIR hyperintensity within the temporal lobe compatible  with vasogenic edema (series 21, image 11).  No midline shift or ventriculomegaly. No restricted diffusion or evidence of acute infarction. No other abnormal intracranial enhancement or dural thickening identified. Basilar cisterns remain patent. Negative cervicomedullary junction.  Outside of the right temporal lobe there is scattered cerebral white matter T2 and FLAIR hyperintensity, both periventricular and cortical. The configuration is nonspecific. No definite cortical encephalomalacia. No chronic blood products identified; mineralization of the mass is again evident.  Vascular: Major intracranial vascular flow voids are grossly preserved.  Skull and upper cervical spine: Visualized bone marrow signal is within normal limits. Negative visible cervical spine.  Sinuses/Orbits: Postoperative changes to the right globe. Intraorbital soft tissues are otherwise negative. Paranasal sinuses and mastoids are stable and well pneumatized.  Other: Grossly normal internal auditory structures.  IMPRESSION: 1. Extra-axial 4.4 cm enhancing mass extending cephalad from the right sphenoid wing is most compatible with a Meningioma. Regional mass effect, including on the right MCA vessels, with mild vasogenic edema in the right temporal lobe. The mass abuts but does not appear to invade the right ICA terminus, the right cavernous sinus, and the right orbital apex. 2. No other intracranial mass or acute intracranial abnormality. 3. Moderate for age nonspecific cerebral white matter signal changes, most commonly due to chronic small vessel disease.  Electronically Signed   By: Genevie Ann M.D.   On: 10/06/2018 02:28 CT Head Wo Contrast CLINICAL DATA:  Headache, dizziness  EXAM: CT HEAD WITHOUT CONTRAST  TECHNIQUE: Contiguous axial images were obtained from the base of the skull through the vertex without intravenous contrast.  COMPARISON:  None.  FINDINGS: Brain: Large partially  calcified lesion appears to arise from the right middle cranial fossa with upward extension and mild regional vasogenic edema. Mild mass effect with local sulcal effacement and partial effacement of the frontal horn of the right lateral ventricle. A smaller live similar appearing 9 mm partially calcified lesion is also present along the right parietal convexity (2/9). No evidence of acute infarction, hemorrhage,  hydrocephalus, or extra-axial collection.  Vascular: No hyperdense vessel or unexpected calcification.  Skull: No abnormal sclerosis of the adjacent calvaria. Hyperostosis frontalis interna. No scalp swelling or hematoma.  Sinuses/Orbits: Paranasal sinuses and mastoid air cells are predominantly clear. Included orbital structures are unremarkable.  Other: None.  IMPRESSION: Partially calcified extra-axial appearing mass arising from the right middle cranial fossa/sphenoid wing with some adjacent mass effect and regional edema. This is most likely a meningioma. Brain MR without and with contrast could be ordered to confirm.  Additional smaller partially calcified lesion along the right parietal could reflect an additional meningioma.  These results were called by telephone at the time of interpretation on 10/06/2018 at 12:11 am to provider [facility] , who verbally acknowledged these results.  Electronically Signed   By: Lovena Le M.D.   On: 10/06/2018 00:12   Assessment  Diagnoses of Chronic musculoskeletal pain, Chronic pain syndrome, Neuropathic pain, Neuropathy, Polyneuropathy associated with underlying disease ([place]), DM type 2 with diabetic peripheral neuropathy ([place]), and Vitamin D deficiency were pertinent to this visit.  Plan of Care  Problem-specific:  No problem-specific Assessment & Plan notes found for this encounter.  I have discontinued Josseline L. [name] liraglutide, GNP Calcium 1200, and meloxicam. I am also having her maintain her  pantoprazole, lisinopril, Lidocaine-Menthol (ICY HOT LIDOCAINE PLUS MENTHOL EX), polyethylene glycol, amLODipine, carvedilol, DULoxetine, Toujeo Max SoloStar, Xigduo XR, dicyclomine, metFORMIN, pregabalin, sucralfate, baclofen, traMADol, gabapentin, Vitamin D3, and Magnesium.  Pharmacotherapy (Medications Ordered): Meds ordered this encounter  Medications  . baclofen (LIORESAL) 10 MG tablet    Sig: Take 1 tablet (10 mg total) by mouth 3 (three) times daily.    Dispense:  90 tablet    Refill:  5    Fill one day early if pharmacy is closed on scheduled refill date. May substitute for generic if available.  . traMADol (ULTRAM) 50 MG tablet    Sig: Take 1 tablet (50 mg total) by mouth every 8 (eight) hours as needed for severe pain.    Dispense:  90 tablet    Refill:  5    Chronic Pain: STOP Act (Not applicable) Fill 1 day early if closed on refill date. Do not fill until: 02/16/2019. To last until: 08/15/2019. Avoid benzodiazepines within 8 hours of opioids  . gabapentin (NEURONTIN) 800 MG tablet    Sig: Take 1 tablet (800 mg total) by mouth 4 (four) times daily.    Dispense:  120 tablet    Refill:  5    Fill one day early if pharmacy is closed on scheduled refill date. May substitute for generic if available.  . Cholecalciferol (VITAMIN D3) 125 MCG (5000 UT) CAPS    Sig: Take 1 capsule (5,000 Units total) by mouth daily with breakfast. Take along with calcium and magnesium.    Dispense:  30 capsule    Refill:  11    Fill one day early if pharmacy is closed on scheduled refill date. May substitute for generic if available.  . Magnesium 500 MG CAPS    Sig: Take 1 capsule (500 mg total) by mouth 2 (two) times daily at 8 am and 10 pm.    Dispense:  60 capsule    Refill:  11    Fill one day early if pharmacy is closed on scheduled refill date. May substitute for generic if available.   Orders:  No orders of the defined types were placed in this encounter.  Follow-up plan:   Return in about  6 months (around 08/15/2019) for (VV), (MM).      Interventional management options:  Considering:   NOTE: UNCONTROLLED IDDM (NO STEROIDS)  Diagnostic left LSB Possible left-sided lumbar sympathetic RFA Diagnostic right L5 TFESI Diagnostic bilateral lumbar facet block Possible bilateral lumbar facet RFA Diagnostic bilateral intra-articular knee joint injection (w/ L.A. + steroids)  Diagnostic bilateral genicular NB Possible bilateral genicular nerve RFA   Palliative PRN treatment(s):   Palliative left L5 TFESI #4 + left L4-5 interlaminar LESI #4  Palliative left L5 TFESI #4 Palliative left L4-5 interlaminar LESI #4  Palliative left L5-S1 LESI #3  Palliative bilateral Hyalgan knee series S2N1 (last done on 02/25/2018)     Recent Visits No visits were found meeting these conditions.  Showing recent visits within past 90 days and meeting all other requirements   Today's Visits Date Type Provider Dept  02/16/19 Telemedicine Milinda Pointer, MD [facility]  Showing today's visits and meeting all other requirements   Future Appointments No visits were found meeting these conditions.  Showing future appointments within next 90 days and meeting all other requirements   I discussed the assessment and treatment plan with the patient. The patient was provided an opportunity to ask questions and all were answered. The patient agreed with the plan and demonstrated an understanding of the instructions.  Patient advised to call back or seek an in-person evaluation if the symptoms or condition worsens.  Duration of encounter: 13 minutes.  Note by: Gaspar Cola, MD Date: 02/16/2019; Time: 10:43 AM

## 2019-02-16 ENCOUNTER — Ambulatory Visit: Payer: BLUE CROSS/BLUE SHIELD | Attending: Pain Medicine | Admitting: Pain Medicine

## 2019-02-16 ENCOUNTER — Other Ambulatory Visit: Payer: Self-pay

## 2019-02-16 DIAGNOSIS — M792 Neuralgia and neuritis, unspecified: Secondary | ICD-10-CM | POA: Diagnosis not present

## 2019-02-16 DIAGNOSIS — E1142 Type 2 diabetes mellitus with diabetic polyneuropathy: Secondary | ICD-10-CM | POA: Diagnosis not present

## 2019-02-16 DIAGNOSIS — G629 Polyneuropathy, unspecified: Secondary | ICD-10-CM

## 2019-02-16 DIAGNOSIS — G63 Polyneuropathy in diseases classified elsewhere: Secondary | ICD-10-CM

## 2019-02-16 DIAGNOSIS — G8929 Other chronic pain: Secondary | ICD-10-CM

## 2019-02-16 DIAGNOSIS — M7918 Myalgia, other site: Secondary | ICD-10-CM

## 2019-02-16 DIAGNOSIS — G894 Chronic pain syndrome: Secondary | ICD-10-CM | POA: Diagnosis not present

## 2019-02-16 DIAGNOSIS — E559 Vitamin D deficiency, unspecified: Secondary | ICD-10-CM

## 2019-02-16 MED ORDER — GABAPENTIN 800 MG PO TABS: 800.0000 mg | ORAL_TABLET | Freq: Four times a day (QID) | ORAL | 5 refills | Status: DC

## 2019-02-16 MED ORDER — MAGNESIUM 500 MG PO CAPS: 500.0000 mg | ORAL_CAPSULE | Freq: Two times a day (BID) | ORAL | 11 refills | Status: AC

## 2019-02-16 MED ORDER — VITAMIN D3 125 MCG (5000 UT) PO CAPS: 1.0000 | ORAL_CAPSULE | Freq: Every day | ORAL | 11 refills | Status: DC

## 2019-02-16 MED ORDER — TRAMADOL HCL 50 MG PO TABS: 50.0000 mg | ORAL_TABLET | Freq: Three times a day (TID) | ORAL | 5 refills | Status: DC | PRN

## 2019-02-16 MED ORDER — BACLOFEN 10 MG PO TABS: 10.0000 mg | ORAL_TABLET | Freq: Three times a day (TID) | ORAL | 5 refills | Status: AC

## 2019-03-11 DIAGNOSIS — M542 Cervicalgia: Secondary | ICD-10-CM | POA: Insufficient documentation

## 2019-03-15 ENCOUNTER — Ambulatory Visit: Payer: BLUE CROSS/BLUE SHIELD | Admitting: Physical Therapy

## 2019-03-17 ENCOUNTER — Ambulatory Visit: Payer: BLUE CROSS/BLUE SHIELD | Admitting: Physical Therapy

## 2019-03-21 ENCOUNTER — Ambulatory Visit: Payer: BLUE CROSS/BLUE SHIELD | Admitting: Physical Therapy

## 2019-03-23 ENCOUNTER — Ambulatory Visit: Payer: BLUE CROSS/BLUE SHIELD | Admitting: Physical Therapy

## 2019-03-28 ENCOUNTER — Ambulatory Visit: Payer: BLUE CROSS/BLUE SHIELD | Admitting: Physical Therapy

## 2019-03-29 ENCOUNTER — Ambulatory Visit: Payer: BLUE CROSS/BLUE SHIELD | Admitting: Physical Therapy

## 2019-04-04 ENCOUNTER — Ambulatory Visit: Payer: BLUE CROSS/BLUE SHIELD | Admitting: Physical Therapy

## 2019-04-06 ENCOUNTER — Ambulatory Visit: Payer: BLUE CROSS/BLUE SHIELD | Admitting: Physical Therapy

## 2019-04-07 ENCOUNTER — Ambulatory Visit: Payer: BLUE CROSS/BLUE SHIELD

## 2019-04-11 ENCOUNTER — Ambulatory Visit: Payer: BLUE CROSS/BLUE SHIELD | Admitting: Physical Therapy

## 2019-04-14 ENCOUNTER — Ambulatory Visit: Payer: BLUE CROSS/BLUE SHIELD | Admitting: Physical Therapy

## 2019-04-14 ENCOUNTER — Ambulatory Visit: Payer: BLUE CROSS/BLUE SHIELD

## 2019-04-18 ENCOUNTER — Ambulatory Visit: Payer: BLUE CROSS/BLUE SHIELD | Admitting: Physical Therapy

## 2019-04-21 ENCOUNTER — Ambulatory Visit: Payer: BLUE CROSS/BLUE SHIELD

## 2019-04-21 ENCOUNTER — Ambulatory Visit: Payer: BLUE CROSS/BLUE SHIELD | Admitting: Physical Therapy

## 2019-08-05 DIAGNOSIS — M810 Age-related osteoporosis without current pathological fracture: Secondary | ICD-10-CM | POA: Insufficient documentation

## 2019-08-15 ENCOUNTER — Encounter: Payer: BLUE CROSS/BLUE SHIELD | Admitting: Pain Medicine

## 2019-09-04 NOTE — Progress Notes (Signed)
This patient's chart is under "My Open Charts". These are cancelled appointments that keep popping into my "In Basket" as a deficiency. See what you can do to remove them.   Thank you. 

## 2019-09-14 DIAGNOSIS — Z9889 Other specified postprocedural states: Secondary | ICD-10-CM | POA: Insufficient documentation

## 2019-10-13 ENCOUNTER — Other Ambulatory Visit: Payer: Self-pay | Admitting: Internal Medicine

## 2019-10-13 DIAGNOSIS — Z1231 Encounter for screening mammogram for malignant neoplasm of breast: Secondary | ICD-10-CM

## 2019-11-01 DIAGNOSIS — D329 Benign neoplasm of meninges, unspecified: Secondary | ICD-10-CM | POA: Insufficient documentation

## 2019-11-04 ENCOUNTER — Ambulatory Visit
Admission: RE | Admit: 2019-11-04 | Discharge: 2019-11-04 | Disposition: A | Discharge status: Home / Self Care | Payer: Medicare Other | Source: Ambulatory Visit | Attending: Internal Medicine | Admitting: Internal Medicine

## 2019-11-04 ENCOUNTER — Other Ambulatory Visit: Payer: Self-pay

## 2019-11-04 DIAGNOSIS — Z1231 Encounter for screening mammogram for malignant neoplasm of breast: Secondary | ICD-10-CM | POA: Insufficient documentation

## 2019-11-08 DIAGNOSIS — Z0289 Encounter for other administrative examinations: Secondary | ICD-10-CM | POA: Insufficient documentation

## 2019-11-28 IMAGING — MG MM DIGITAL SCREENING BILAT W/ CAD
5 series · 5 of 5 positions shown · non-contrast
Comparison: Previous exam(s).

CLINICAL DATA: Screening.

EXAM:
DIGITAL SCREENING BILATERAL MAMMOGRAM WITH CAD

[L CC]
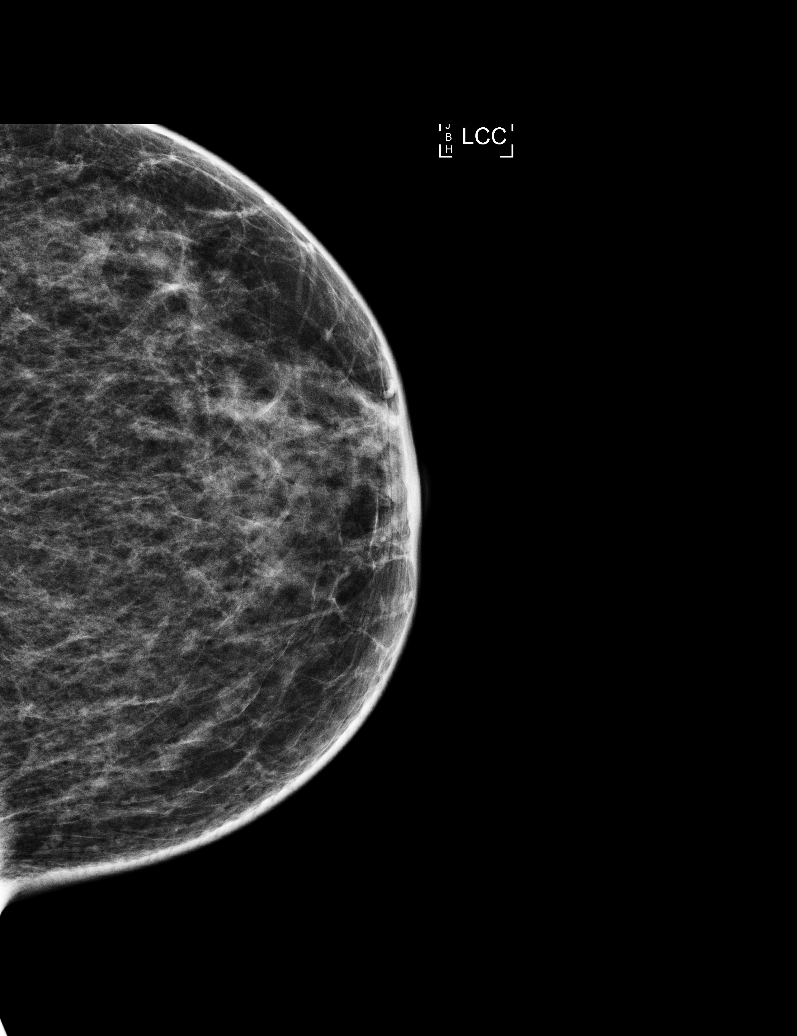

[R MLO]
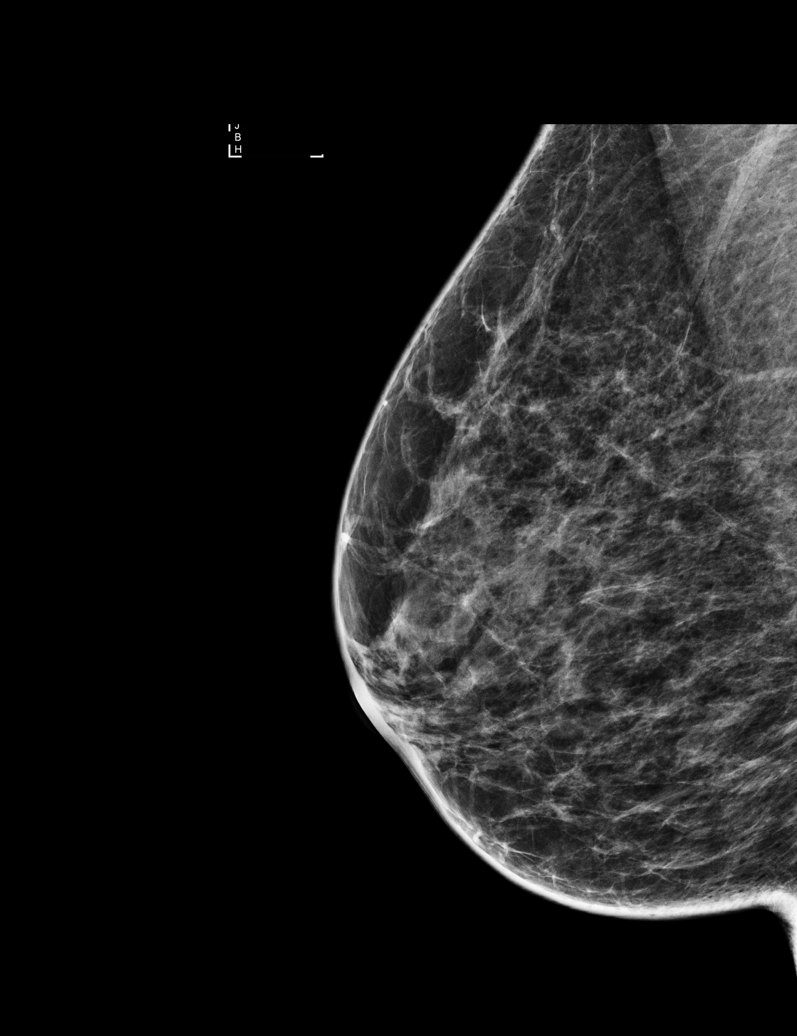

[L MLO (1 of 2)]
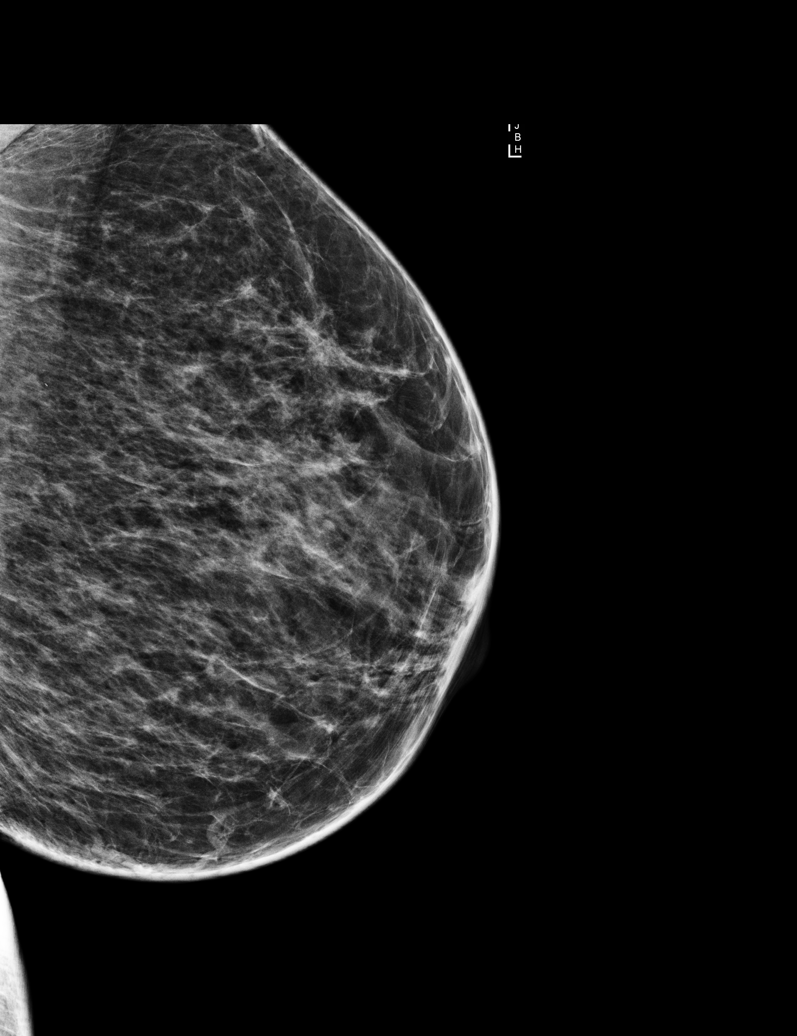

[L MLO (2 of 2)]
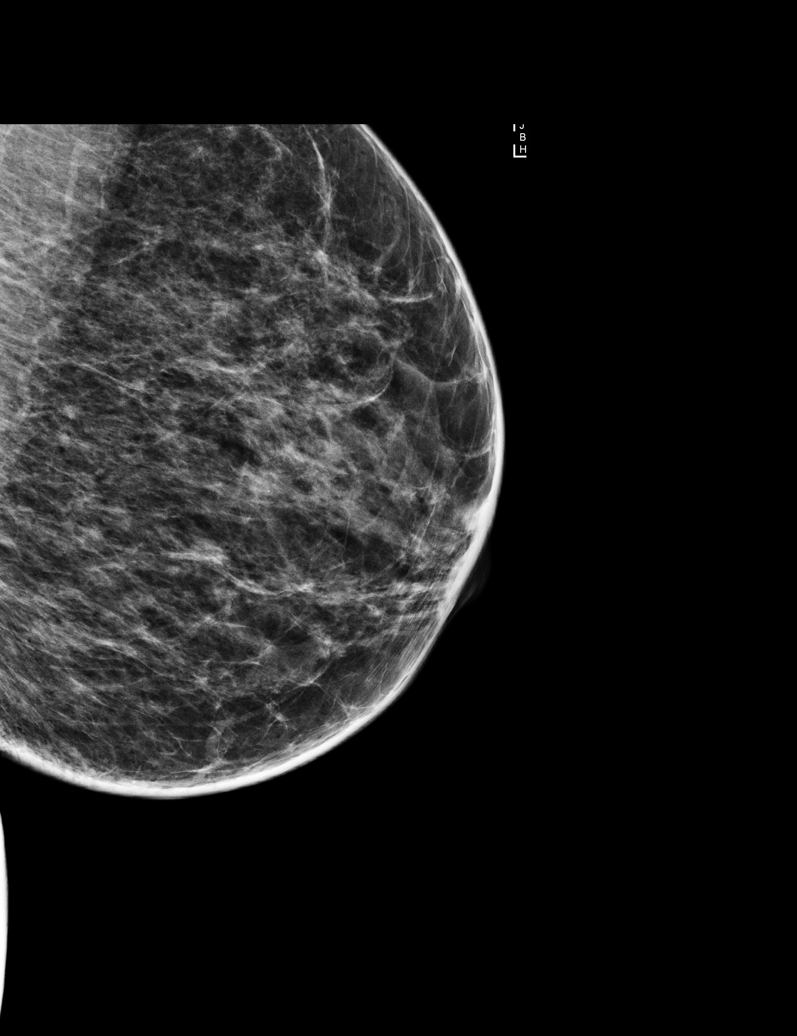

[R CC]
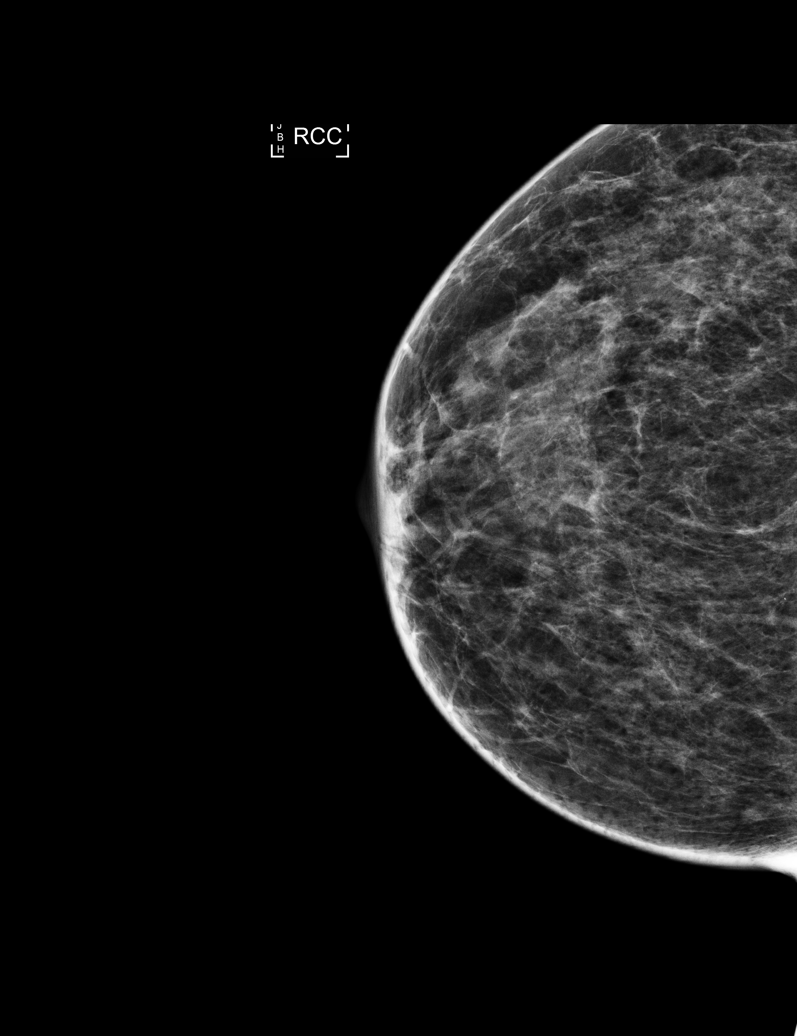

[5 of 5 positions shown; findings below may reference images not displayed]

ACR Breast Density Category c: The breast tissue is heterogeneously
dense, which may obscure small masses.
FINDINGS: There are no findings suspicious for malignancy. Images were
processed with CAD.
IMPRESSION: No mammographic evidence of malignancy. A result letter of this
screening mammogram will be mailed directly to the patient.

RECOMMENDATION:
Screening mammogram in one year. (Code:YJ-[id number])

BI-RADS CATEGORY  1: Negative.

## 2020-07-08 DIAGNOSIS — Z79891 Long term (current) use of opiate analgesic: Secondary | ICD-10-CM | POA: Insufficient documentation

## 2020-07-08 NOTE — Progress Notes (Signed)
PROVIDER NOTE: Information contained herein reflects review and annotations entered in association with encounter. Interpretation of such information and data should be left to medically-trained personnel. Information provided to patient can be located elsewhere in the medical record under "Patient Instructions". Document created using STT-dictation technology, any transcriptional errors that may result from process are unintentional.    Patient: Charlene [name]  Service Category: E/M  Provider: Gaspar Cola, MD  DOB: Oct 09, 1960  DOS: 07/09/2020  Specialty: Interventional Pain Management  MRN: [id number]  Setting: Ambulatory outpatient  PCP: Tracie Harrier, MD  Type: Established Patient    Referring Provider: Tracie Harrier, MD  Location: Office  Delivery: Face-to-face     HPI  [name], a 60 y.o. year old female, is here today because of her Chronic pain syndrome [G89.4]. Ms. Scherr primary complain today is Back Pain (Lumbar bilateral ) and Knee Pain (Left ) Last encounter: My last encounter with her was on Visit date not found. Pertinent problems: Ms. 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[place]); Chronic ankle pain (Primary Area of Pain) (Left); Chronic foot pain (Primary Area of Pain) (Left); Chronic lower extremity pain (Secondary Area of Pain) (Bilateral) (L>R); Chronic knee pain (Fourth Area of Pain) (Bilateral) (L>R); Chronic low back pain [facility] Area of Pain) (Bilateral) (L>R) w/ sciatica (Left); Chronic pain syndrome; DM type 2 with diabetic peripheral neuropathy (HCC); DDD (degenerative disc disease), lumbar; Lumbar facet arthropathy; Lumbar facet syndrome (Bilateral); Lumbar facet hypertrophy; Lumbar foraminal stenosis (L5-S1) (Right); Osteoarthritis of knee (Bilateral); Chronic musculoskeletal pain; Abnormal EMG (07/06/2017); Osteoarthritis  involving multiple joints; 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 Pain Assessment: Severity of Chronic pain is reported as a 1 /10. Location: Back (knee) Left, Right, Lower (left)/back pain into both legs down into the shin and calf area. Onset: More than a month ago. Quality: Discomfort, Constant, Nagging (knee feels like something is rubbing.). Timing: Constant. Modifying factor(s): medications. Vitals:  height is _0  (1.499 m) and weight is 125 lb (56.7 kg). Her temporal temperature is 97.9 F (36.6 C). Her blood pressure is 130/81 and her pulse is 82. Her respiration is 16 and oxygen saturation is 100%.   Reason for encounter: follow-up evaluation. The patient indicates currently experiencing bilateral low back pain and left knee pain.  The last time she had an MRI of the lumbar spine was in 2019.  Likewise, the last time that she had any x-rays of the knees was also in 2019 and by then she already had some osteoarthritis.  Today we will order x-rays of the lumbar spine and left knee to update the current status on both areas.  The patient commented to the nurse that she originally stopped coming here secondary to her insurance and apparently she was let go from her pain practice at [facility] and coincidently her insurance changed and she can apparently now come here.  We are currently  Our last encounter with this patient was a virtual visit on 02/16/2019.  Tramadol prescriptions were provided to this patient, the last of which she had at filled on 08/15/2019.  On 02/16/2019 the patient received prescriptions for baclofen, tramadol, gabapentin, vitamin D3, and magnesium.  The prescription  for tramadol was for 90 tablets with 3  refills.  This prescription should have lasted until 08/15/2019.  According to the PMP this is when she received her last refill meaning that she should have had enough medicine to last until 09/14/2019.  According to the PMP, she had the medication taken over and prescriptions written on 10/04/2019 by the [place].  Medication agreement signed with [facility] on 11/08/2019.  At this point, we will no longer be providing any further pharmacotherapy for this patient.  Review of the care everywhere section of the electronic chart reveals that she is seen at [facility] pain management center on [address] [place].  Her prescriptions are being written by Daun Peacock, [place].  We contacted the [facility] pain clinic and they indicated that the patient had failed to keep more than 3 visits and therefore she was discharged from the practice.  Pharmacotherapy Assessment  Analgesic: Tramadol 50 mg, 1 tab PO q 8 hrs (150 mg/day of tramadol) MME/day: 15 mg/day.   Monitoring: [place] PMP: PDMP reviewed during this encounter.       Pharmacotherapy: No side-effects or adverse reactions reported. Compliance: No problems identified. Effectiveness: Clinically acceptable.  Janett Billow, RN  07/09/2020  1:39 PM  Sign when Signing Visit Safety precautions to be maintained throughout the outpatient stay will include: orient to surroundings, keep bed in low position, maintain call bell within reach at all times, provide assistance with transfer out of bed and ambulation.   Patient was a patient here, last seen on 02/16/20.  Transferred care to [facility] with Dr Carola Rhine.      UDS:  Summary  Date Value Ref Range Status  10/12/2017 FINAL  Final    Comment:    ==================================================================== TOXASSURE COMP DRUG ANALYSIS,UR ==================================================================== Test                             Result       Flag       Units Drug Present and Declared for  Prescription Verification   Tramadol                       >62563       EXPECTED   ng/mg creat   O-Desmethyltramadol            8266         EXPECTED   ng/mg creat   N-Desmethyltramadol            2066         EXPECTED   ng/mg creat    Source of tramadol is a prescription medication.    O-desmethyltramadol and N-desmethyltramadol are expected    metabolites of tramadol.   Gabapentin                     PRESENT      EXPECTED   Baclofen                       PRESENT      EXPECTED   Metoprolol                     PRESENT      EXPECTED Drug Present not Declared for Prescription Verification   Alcohol, Ethyl                 0.154  UNEXPECTED g/dL    Sources of ethyl alcohol include alcoholic beverages or as a    fermentation product of glucose; glucose is present in this    specimen.  Interpret result with caution, as the presence of    ethyl alcohol is likely due, at least in part, to fermentation of    glucose.   Naproxen                       PRESENT      UNEXPECTED Drug Absent but Declared for Prescription Verification   Tizanidine                     Not Detected UNEXPECTED    Tizanidine, as indicated in the declared medication list, is not    always detected even when used as directed.   Duloxetine                     Not Detected UNEXPECTED   Lidocaine                      Not Detected UNEXPECTED    Lidocaine, as indicated in the declared medication list, is not    always detected even when used as directed. ==================================================================== Test                      Result    Flag   Units      Ref Range   Creatinine              47               mg/dL      >=20 ==================================================================== Declared Medications:  The flagging and interpretation on this report are based on the  following declared medications.  Unexpected results may arise from  inaccuracies in the declared medications.  **Note: The  testing scope of this panel includes these medications:  Baclofen  Duloxetine  Gabapentin  Metoprolol  Tramadol  **Note: The testing scope of this panel does not include small to  moderate amounts of these reported medications:  Lidocaine  Tizanidine  **Note: The testing scope of this panel does not include following  reported medications:  Fluticasone  Hydrochlorothiazide (Lisinopril-HCTZ)  Insulin  Lisinopril (Lisinopril-HCTZ)  Metformin  Omeprazole  Pantoprazole  Potassium ==================================================================== For clinical consultation, please call [phone]. ====================================================================      ROS  Constitutional: Denies any fever or chills Gastrointestinal: No reported hemesis, hematochezia, vomiting, or acute GI distress Musculoskeletal: Denies any acute onset joint swelling, redness, loss of ROM, or weakness Neurological: No reported episodes of acute onset apraxia, aphasia, dysarthria, agnosia, amnesia, paralysis, loss of coordination, or loss of consciousness  Medication Review  DULoxetine, Dapagliflozin-metFORMIN HCl ER, Lidocaine-Menthol, Magnesium, Vitamin D3, alendronate, amLODipine, baclofen, carvedilol, dicyclomine, gabapentin, hydrALAZINE, insulin glargine (2 Unit Dial), insulin lispro, lisinopril, metFORMIN, pantoprazole, polyethylene glycol, pregabalin, sucralfate, and traMADol  History Review  Allergy: Ms. Sabatino has No Known Allergies. Drug: Ms. Pepitone  reports no history of drug use. Alcohol:  reports previous alcohol use. Tobacco:  reports that she has never smoked. She has never used smokeless tobacco. Social: Ms. Hilmer  reports that she has never smoked. She has never used smokeless tobacco. She reports previous alcohol use. She reports that she does not use drugs. Medical:  has a past medical history of Arthritis, Degenerative disc disease, lumbar, Diabetes mellitus without  complication ([place]), GERD (gastroesophageal  reflux disease), Hypertension, Neuropathy, Polyneuropathy, and Polyneuropathy. Surgical: Ms. Sieloff  has a past surgical history that includes arthroscopic rotator cuff; Colonoscopy; Abdominal hysterectomy; endosopic sinus; and Cataract extraction w/PHACO (Right, 11/25/2017). Family: family history includes Alcohol abuse in her father; Breast cancer in her maternal aunt; Cancer in her father.  Laboratory Chemistry Profile   Renal Lab Results  Component Value Date   BUN 13 12/23/2018   CREATININE 0.59 12/23/2018   BCR 17 10/12/2017   GFRAA >60 12/23/2018   GFRNONAA >60 12/23/2018     Hepatic Lab Results  Component Value Date   AST 17 10/21/2018   ALT 21 10/21/2018   ALBUMIN 3.6 10/21/2018   ALKPHOS 81 10/21/2018   LIPASE 20 10/21/2018     Electrolytes Lab Results  Component Value Date   NA 137 12/23/2018   K 4.1 12/23/2018   CL 100 12/23/2018   CALCIUM 9.8 12/23/2018   MG 2.0 10/12/2017     Bone Lab Results  Component Value Date   25OHVITD1 17 (L) 10/12/2017   25OHVITD2 <1.0 10/12/2017   25OHVITD3 17 10/12/2017     Inflammation (CRP: Acute Phase) (ESR: Chronic Phase) Lab Results  Component Value Date   CRP 4 10/12/2017   ESRSEDRATE 12 10/12/2017       Note: Above Lab results reviewed.  Recent Imaging Review  MM 3D SCREEN BREAST BILATERAL CLINICAL DATA:  Screening.  EXAM: DIGITAL SCREENING BILATERAL MAMMOGRAM WITH TOMO AND CAD  COMPARISON:  Previous exam(s).  ACR Breast Density Category b: There are scattered areas of fibroglandular density.  FINDINGS: There are no findings suspicious for malignancy. Images were processed with CAD.  IMPRESSION: No mammographic evidence of malignancy. A result letter of this screening mammogram will be mailed directly to the patient.  RECOMMENDATION: Screening mammogram in one year. (Code:SM-B-01Y)  BI-RADS CATEGORY  1: Negative.  Electronically Signed   By: Dorise Bullion III M.D   On: 11/08/2019 12:32 Note: Reviewed        Physical Exam  General appearance: Well nourished, well developed, and well hydrated. In no apparent acute distress Mental status: Alert, oriented x 3 (person, place, & time)       Respiratory: No evidence of acute respiratory distress Eyes: PERLA Vitals: BP 130/81 (BP Location: Right Arm, Patient Position: Sitting, Cuff Size: Large)   Pulse 82   Temp 97.9 F (36.6 C) (Temporal)   Resp 16   Ht _0  (1.499 m)   Wt 125 lb (56.7 kg)   SpO2 100%   BMI 25.25 kg/m  BMI: Estimated body mass index is 25.25 kg/m as calculated from the following:   Height as of this encounter: _1  (1.499 m).   Weight as of this encounter: 125 lb (56.7 kg). Ideal: Patient must be at least 60 in tall to calculate ideal body weight  Assessment   Status Diagnosis  Controlled Controlled Controlled 1. Chronic pain syndrome   2. Chronic lower extremity pain (Secondary Area of Pain) (Bilateral) (L>R)   3. 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 Chronic knee pain (Fourth Area of Pain) (Bilateral) (L>R)   5. Pharmacologic therapy   6. Chronic use of opiate for therapeutic purpose   7. Abnormal EMG (07/06/2017)      Updated Problems: Problem  Meningioma (Hcc)  Neck Pain  Meningioma of Right Sphenoid Wing Involving Cavernous Sinus (Hcc)  Abnormal EMG (07/06/2017)   07/06/17 EMG IMPRESSION: Abnormal study. 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    Arthropathy of Lumbar Facet Joint  Chronic Pain Disorder  Left Leg Weakness  Pain in Both Lower Extremities  Back Pain With Left-Sided Sciatica  Meralgia Paraesthetica, Left  Pain Medication Agreement Signed   Formatting of this note might be different from the original. [facility] ANES opioid agreement reviewed, signed and copy  given to patient.  GOAL-"to be more productive" [place] 11/08/19    Hx of Resection of Meningioma  Osteoporosis  Essential Hypertension  Type 2 Diabetes Mellitus Without Complications (Hcc)    Plan of Care  Problem-specific:  No problem-specific Assessment & Plan notes found for this encounter.  Ms. CAROLINE LONGIE has a current medication list which includes the following long-term medication(s): amlodipine, baclofen, carvedilol, vitamin d3, dicyclomine, duloxetine, toujeo max solostar, insulin lispro, lisinopril, magnesium, pantoprazole, pregabalin, sucralfate, tramadol, gabapentin, and hydralazine.  Pharmacotherapy (Medications Ordered): No orders of the defined types were placed in this encounter.  Orders:  Orders Placed This Encounter  Procedures   DG Lumbar Spine Complete W/Bend    Patient presents with axial pain with possible radicular component.  In addition to any acute findings, please report on:  1. Facet (Zygapophyseal) joint DJD (Hypertrophy, space narrowing, subchondral sclerosis, and/or osteophyte formation) 2. DDD and/or IVDD (Loss of disc height, desiccation or "Black disc disease") 3. Pars defects 4. Spondylolisthesis, spondylosis, and/or spondyloarthropathies (include Degree/Grade of displacement in mm) 5. Vertebral body Fractures, including age (old, new/acute) 77. Modic Type Changes 7. Demineralization 8. Bone pathology 9. Central, Lateral Recess, and/or Foraminal Stenosis (include AP diameter of stenosis in mm) 10. Surgical changes (hardware type, status, and presence of fibrosis)  NOTE: Please specify level(s) and laterality. If applicable: Please indicate ROM and/or evidence of instability (>59m displacement between flexion and extension views)    Standing Status:   Future    Standing Expiration Date:   08/08/2020    Scheduling Instructions:     Imaging must be done as soon as possible. Inform patient that order will expire within 30 days and I will not  renew it.    Order Specific Question:   Reason for Exam (SYMPTOM  OR DIAGNOSIS REQUIRED)    Answer:   Low back pain    Order Specific Question:   Is patient pregnant?    Answer:   No    Order Specific Question:   Preferred imaging location?    Answer:   [facility]    Order Specific Question:   Call Results- Best Contact Number?    Answer:   [phone]([facility]    Order Specific Question:   Radiology Contrast Protocol - do NOT remove file path    Answer:   [email or web address]    Order Specific Question:   Release to patient    Answer:   Immediate   DG Knee 1-2 Views Left    Standing Status:   Future    Standing Expiration Date:   08/08/2020    Scheduling Instructions:     Imaging must be done as soon as possible. Inform patient that order will expire within 30 days and I will not renew it.    Order Specific Question:   Reason for Exam (SYMPTOM  OR DIAGNOSIS REQUIRED)    Answer:   Left knee pain/arthralgia    Order Specific Question:   Is the patient pregnant?    Answer:   No  Order Specific Question:   Preferred imaging location?    Answer:   [facility]    Order Specific Question:   Call Results- Best Contact Number?    Answer:   [phone] ([facility])    Order Specific Question:   Release to patient    Answer:   Immediate    Follow-up plan:   Return in about 2 weeks (around 07/23/2020) for evaluation day (F2F) (MM), to discuss imaging results.      Interventional management options:  Considering:   NOTE: UNCONTROLLED IDDM (NO STEROIDS)  Diagnostic left LSB  Possible left-sided lumbar sympathetic RFA  Diagnostic right L5 TFESI  Diagnostic bilateral lumbar facet block  Possible bilateral lumbar facet RFA  Diagnostic bilateral IA knee joint injection (w/ L.A. + steroids)  Diagnostic bilateral genicular NB  Possible bilateral genicular nerve RFA    Palliative PRN treatment(s):   Palliative left L5  TFESI #4 + left L4-5 interlaminar LESI #4  Palliative left L5 TFESI #4 Palliative left L4-5 interlaminar LESI #4  Palliative left L5-S1 LESI #3  Palliative bilateral Hyalgan knee series S2N1 (last done on 02/25/2018)    Recent Visits No visits were found meeting these conditions. Showing recent visits within past 90 days and meeting all other requirements Today's Visits Date Type Provider Dept  07/09/20 Office Visit Milinda Pointer, MD [facility]  Showing today's visits and meeting all other requirements Future Appointments Date Type Provider Dept  09/03/20 Appointment Milinda Pointer, MD [facility]  Showing future appointments within next 90 days and meeting all other requirements I discussed the assessment and treatment plan with the patient. The patient was provided an opportunity to ask questions and all were answered. The patient agreed with the plan and demonstrated an understanding of the instructions.  Patient advised to call back or seek an in-person evaluation if the symptoms or condition worsens.  Duration of encounter: 31 minutes.  Note by: Gaspar Cola, MD Date: 07/09/2020; Time: 2:57 PM

## 2020-07-09 ENCOUNTER — Encounter: Payer: Self-pay | Admitting: Pain Medicine

## 2020-07-09 ENCOUNTER — Ambulatory Visit
Admission: RE | Admit: 2020-07-09 | Discharge: 2020-07-09 | Disposition: A | Discharge status: Home / Self Care | Payer: Medicare Other | Attending: Pain Medicine | Admitting: Pain Medicine

## 2020-07-09 ENCOUNTER — Other Ambulatory Visit: Payer: Self-pay

## 2020-07-09 ENCOUNTER — Ambulatory Visit (HOSPITAL_BASED_OUTPATIENT_CLINIC_OR_DEPARTMENT_OTHER): Payer: Medicare Other | Admitting: Pain Medicine

## 2020-07-09 ENCOUNTER — Ambulatory Visit
Admission: RE | Admit: 2020-07-09 | Discharge: 2020-07-09 | Disposition: A | Discharge status: Home / Self Care | Payer: Medicare Other | Source: Ambulatory Visit | Attending: Pain Medicine | Admitting: Pain Medicine

## 2020-07-09 VITALS — BP 130/81 | HR 82 | Temp 97.9°F | Resp 16 | Ht 59.0 in | Wt 125.0 lb

## 2020-07-09 DIAGNOSIS — Z79891 Long term (current) use of opiate analgesic: Secondary | ICD-10-CM | POA: Insufficient documentation

## 2020-07-09 DIAGNOSIS — G894 Chronic pain syndrome: Secondary | ICD-10-CM | POA: Insufficient documentation

## 2020-07-09 DIAGNOSIS — M25562 Pain in left knee: Secondary | ICD-10-CM | POA: Insufficient documentation

## 2020-07-09 DIAGNOSIS — M79605 Pain in left leg: Secondary | ICD-10-CM | POA: Insufficient documentation

## 2020-07-09 DIAGNOSIS — M5442 Lumbago with sciatica, left side: Secondary | ICD-10-CM | POA: Insufficient documentation

## 2020-07-09 DIAGNOSIS — Z79899 Other long term (current) drug therapy: Secondary | ICD-10-CM

## 2020-07-09 DIAGNOSIS — G8929 Other chronic pain: Secondary | ICD-10-CM

## 2020-07-09 DIAGNOSIS — R94131 Abnormal electromyogram [EMG]: Secondary | ICD-10-CM

## 2020-07-09 DIAGNOSIS — M79604 Pain in right leg: Secondary | ICD-10-CM

## 2020-07-09 DIAGNOSIS — M25561 Pain in right knee: Secondary | ICD-10-CM | POA: Insufficient documentation

## 2020-07-09 NOTE — Patient Instructions (Signed)
____________________________________________________________________________________________  General Risks and Possible Complications  Patient Responsibilities: It is important that you read this as it is part of your informed consent. It is our duty to inform you of the risks and possible complications associated with treatments offered to you. It is your responsibility as a patient to read this and to ask questions about anything that is not clear or that you believe was not covered in this document.  Patient's Rights: You have the right to refuse treatment. You also have the right to change your mind, even after initially having agreed to have the treatment done. However, under this last option, if you wait until the last second to change your mind, you may be charged for the materials used up to that point.  Introduction: Medicine is not an exact science. Everything in Medicine, including the lack of treatment(s), carries the potential for danger, harm, or loss (which is by definition: Risk). In Medicine, a complication is a secondary problem, condition, or disease that can aggravate an already existing one. All treatments carry the risk of possible complications. The fact that a side effects or complications occurs, does not imply that the treatment was conducted incorrectly. It must be clearly understood that these can happen even when everything is done following the highest safety standards.  No treatment: You can choose not to proceed with the proposed treatment alternative. The "PRO(s)" would include: avoiding the risk of complications associated with the therapy. The "CON(s)" would include: not getting any of the treatment benefits. These benefits fall under one of three categories: diagnostic; therapeutic; and/or palliative. Diagnostic benefits include: getting information which can ultimately lead to improvement of the disease or symptom(s). Therapeutic benefits are those associated with the  successful treatment of the disease. Finally, palliative benefits are those related to the decrease of the primary symptoms, without necessarily curing the condition (example: decreasing the pain from a flare-up of a chronic condition, such as incurable terminal cancer).  General Risks and Complications: These are associated to most interventional treatments. They can occur alone, or in combination. They fall under one of the following six (6) categories: no benefit or worsening of symptoms; bleeding; infection; nerve damage; allergic reactions; and/or death. No benefits or worsening of symptoms: In Medicine there are no guarantees, only probabilities. No healthcare provider can ever guarantee that a medical treatment will work, they can only state the probability that it may. Furthermore, there is always the possibility that the condition may worsen, either directly, or indirectly, as a consequence of the treatment. Bleeding: This is more common if the patient is taking a blood thinner, either prescription or over the counter (example: Goody Powders, Fish oil, Aspirin, Garlic, etc.), or if suffering a condition associated with impaired coagulation (example: Hemophilia, cirrhosis of the liver, low platelet counts, etc.). However, even if you do not have one on these, it can still happen. If you have any of these conditions, or take one of these drugs, make sure to notify your treating physician. Infection: This is more common in patients with a compromised immune system, either due to disease (example: diabetes, cancer, human immunodeficiency virus [HIV], etc.), or due to medications or treatments (example: therapies used to treat cancer and rheumatological diseases). However, even if you do not have one on these, it can still happen. If you have any of these conditions, or take one of these drugs, make sure to notify your treating physician. Nerve Damage: This is more common when the treatment is an invasive    invasive one, but it can also happen with the use of medications, such as those used in the treatment of cancer. The damage can occur to small secondary nerves, or to large primary ones, such as those in the spinal cord and brain. This damage may be temporary or permanent and it may lead to impairments that can range from temporary numbness to permanent paralysis and/or brain death. Allergic Reactions: Any time a substance or material comes in contact with our body, there is the possibility of an allergic reaction. These can range from a mild skin rash (contact dermatitis) to a severe systemic reaction (anaphylactic reaction), which can result in death. Death: In general, any medical intervention can result in death, most of the time due to an unforeseen complication. ____________________________________________________________________________________________    

## 2020-07-09 NOTE — Progress Notes (Signed)
Safety precautions to be maintained throughout the outpatient stay will include: orient to surroundings, keep bed in low position, maintain call bell within reach at all times, provide assistance with transfer out of bed and ambulation.   Patient was a patient here, last seen on 02/16/20.  Transferred care to [facility] with Dr Carola Rhine.

## 2020-09-02 NOTE — Progress Notes (Signed)
PROVIDER NOTE: Information contained herein reflects review and annotations entered in association with encounter. Interpretation of such information and data should be left to medically-trained personnel. Information provided to patient can be located elsewhere in the medical record under "Patient Instructions". Document created using STT-dictation technology, any transcriptional errors that may result from process are unintentional.    Patient: Charlene [name]  Service Category: E/M  Provider: Gaspar Cola, MD  DOB: 06-27-1960  DOS: 09/03/2020  Specialty: Interventional Pain Management  MRN: [id number]  Setting: Ambulatory outpatient  PCP: Tracie Harrier, MD  Type: Established Patient    Referring Provider: Tracie Harrier, MD  Location: Office  Delivery: Face-to-face     HPI  [name], a 60 y.o. year old female, is here today because of her Chronic pain syndrome [G89.4]. Charlene [name] primary complain today is Back Pain Last encounter: My last encounter with her was on 07/09/2020. 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[place]); Chronic ankle pain (Left); Chronic foot pain (Left); Chronic lower extremity pain (2ry area of Pain) (Bilateral) (L>R); Chronic knee pain (4th area of Pain) (Bilateral) (L>R); Chronic low back pain (3ry area of Pain) (Bilateral) (L>R) w/ sciatica (Left); Chronic pain syndrome; DM type 2 with diabetic peripheral neuropathy (HCC); DDD (degenerative disc disease), lumbar; Lumbar facet arthropathy; Lumbar facet syndrome (Bilateral); Lumbar facet hypertrophy; Lumbar foraminal stenosis (L5-S1) (Right); Osteoarthritis of knee (Bilateral); Chronic musculoskeletal pain; Abnormal EMG (07/06/2017); Osteoarthritis involving multiple joints; 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 Pain Assessment: Severity of Chronic pain is reported as a 1 /10. Location: Back Right, Left, Lower/pain radiaties down her left leg to front of her shin. Onset: More than a month ago. Quality: Discomfort, Nagging, Constant, Aching. Timing: Constant. Modifying factor(s): meds. Vitals:  height is $RemoveB'4\' 11"'lRbkhHoT$  (1.499 m) and weight is 150 lb (68 kg). Her temporal temperature is 96.9 F (36.1 C) (abnormal). Her blood pressure is 103/65 and her pulse is 79. Her respiration is 16 and oxygen saturation is 100%.   Reason for encounter: follow-up evaluation to go over x-ray results.  The last time this patient had a tramadol prescription written by me was on 08/15/2019.  She has been getting her pregabalin from Dr. Ginette Pitman and tramadol from Daun Peacock, NP.  On 07/09/2020 she had a left knee and a lumbar spine x-ray done.  The x-ray of the left knee was negative for any fractures, dislocations, or joint effusion.  There was no evidence of arthropathy or any other focal bone abnormality.  Soft tissue was found to be unremarkable.  The x-rays of the lumbar spine done on 07/09/2020 showed mild multilevel degenerative disc disease and lower lumbar facet arthropathy.  There vertebral body height was maintained without any evidence of fracture.  Slight lumbar dextrocurvature.  Grade 1 anterolisthesis of L4 over L5 without dynamic instability.  Mild multilevel intervertebral disc height loss.  This seems to be more pronounced at the L3-4 through L5-S1 level.  Lower lumbar facet arthropathy observed.  No spinal instability.  The last time that  we had done  any procedures for this patient was on 09/21/2018, at which time we did a left L5 TFESI and a left L4-5 LESI #4.  We have done no diagnostic lumbar facet blocks.  However, the patient also has uncontrolled insulin-dependent diabetes mellitus.  Last hemoglobin A1c done on 06/27/2020 was 7.5%  Today the patient indicates returning to the practice for interventional therapies as well as the medication management.  Because the patient had left the practice she will need to sign a medication agreement.  Based on today's evaluation we have decided to schedule her for a diagnostic bilateral lumbar facet block under fluoroscopic guidance.  She will receive oral anxiolysis in the form of Valium for her procedure anxiety.  Pharmacotherapy Assessment  Analgesic: No chronic opioid analgesics therapy prescribed by our practice. Tramadol 50 mg, 1 tab PO q 8 hrs (150 mg/day of tramadol) MME/day: 15 mg/day.   Monitoring: [place] PMP: PDMP reviewed during this encounter.       Pharmacotherapy: No side-effects or adverse reactions reported. Compliance: No problems identified. Effectiveness: Clinically acceptable.  Chauncey Fischer, RN  09/03/2020 11:28 AM  Sign when Signing Visit Nursing Pain Medication Assessment:  Safety precautions to be maintained throughout the outpatient stay will include: orient to surroundings, keep bed in low position, maintain call bell within reach at all times, provide assistance with transfer out of bed and ambulation.  Medication Inspection Compliance: Charlene [name] did not comply with our request to bring her pills to be counted. She was reminded that bringing the medication bottles, even when empty, is a requirement.  Medication: None brought in. Pill/Patch Count: None available to be counted. Bottle Appearance: No container available. Did not bring bottle(s) to appointment. Filled Date: N/A Last Medication intake:   2 months Safety precautions to be maintained throughout the outpatient stay  will include: orient to surroundings, keep bed in low position, maintain call bell within reach at all times, provide assistance with transfer out of bed and ambulation.      UDS:  Summary  Date Value Ref Range Status  10/12/2017 FINAL  Final    Comment:    ==================================================================== [facility] COMP DRUG ANALYSIS,UR ==================================================================== Test                             Result       Flag       Units Drug Present and Declared for Prescription Verification   Tramadol                       >56389       EXPECTED   ng/mg creat   O-Desmethyltramadol            8266         EXPECTED   ng/mg creat   N-Desmethyltramadol            2066         EXPECTED   ng/mg creat    Source of tramadol is a prescription medication.    O-desmethyltramadol and N-desmethyltramadol are expected    metabolites of tramadol.   Gabapentin                     PRESENT      EXPECTED   Baclofen                       PRESENT  EXPECTED   Metoprolol                     PRESENT      EXPECTED Drug Present not Declared for Prescription Verification   Alcohol, Ethyl                 0.154        UNEXPECTED g/dL    Sources of ethyl alcohol include alcoholic beverages or as a    fermentation product of glucose; glucose is present in this    specimen.  Interpret result with caution, as the presence of    ethyl alcohol is likely due, at least in part, to fermentation of    glucose.   Naproxen                       PRESENT      UNEXPECTED Drug Absent but Declared for Prescription Verification   Tizanidine                     Not Detected UNEXPECTED    Tizanidine, as indicated in the declared medication list, is not    always detected even when used as directed.   Duloxetine                     Not Detected UNEXPECTED   Lidocaine                      Not Detected UNEXPECTED    Lidocaine, as indicated in the declared medication list, is  not    always detected even when used as directed. ==================================================================== Test                      Result    Flag   Units      Ref Range   Creatinine              47               mg/dL      >=20 ==================================================================== Declared Medications:  The flagging and interpretation on this report are based on the  following declared medications.  Unexpected results may arise from  inaccuracies in the declared medications.  **Note: The testing scope of this panel includes these medications:  Baclofen  Duloxetine  Gabapentin  Metoprolol  Tramadol  **Note: The testing scope of this panel does not include small to  moderate amounts of these reported medications:  Lidocaine  Tizanidine  **Note: The testing scope of this panel does not include following  reported medications:  Fluticasone  Hydrochlorothiazide (Lisinopril-HCTZ)  Insulin  Lisinopril (Lisinopril-HCTZ)  Metformin  Omeprazole  Pantoprazole  Potassium ==================================================================== For clinical consultation, please call [phone]. ====================================================================      ROS  Constitutional: Denies any fever or chills Gastrointestinal: No reported hemesis, hematochezia, vomiting, or acute GI distress Musculoskeletal: Denies any acute onset joint swelling, redness, loss of ROM, or weakness Neurological: No reported episodes of acute onset apraxia, aphasia, dysarthria, agnosia, amnesia, paralysis, loss of coordination, or loss of consciousness  Medication Review  DULoxetine, Dapagliflozin-metFORMIN HCl ER, Lidocaine-Menthol, Magnesium, Vitamin D3, alendronate, amLODipine, baclofen, carvedilol, diazepam, dicyclomine, hydrALAZINE, insulin glargine (2 Unit Dial), insulin lispro, lisinopril, metFORMIN, pantoprazole, polyethylene glycol, pregabalin, sucralfate, and  traMADol  History Review  Allergy: Ms. Charon has No Known Allergies. Drug: Ms. Metzner  reports no history of drug use. Alcohol:  reports  that she does not currently use alcohol. Tobacco:  reports that she has never smoked. She has never used smokeless tobacco. Social: Ms. Kuzel  reports that she has never smoked. She has never used smokeless tobacco. She reports that she does not currently use alcohol. She reports that she does not use drugs. Medical:  has a past medical history of Arthritis, Degenerative disc disease, lumbar, Diabetes mellitus without complication ([place]), GERD (gastroesophageal reflux disease), Hypertension, Neuropathy, Polyneuropathy, and Polyneuropathy. Surgical: Ms. Buren  has a past surgical history that includes arthroscopic rotator cuff; Colonoscopy; Abdominal hysterectomy; endosopic sinus; and Cataract extraction w/PHACO (Right, 11/25/2017). Family: family history includes Alcohol abuse in her father; Breast cancer in her maternal aunt; Cancer in her father.  Laboratory Chemistry Profile   Renal Lab Results  Component Value Date   BUN 13 12/23/2018   CREATININE 0.59 12/23/2018   BCR 17 10/12/2017   GFRAA >60 12/23/2018   GFRNONAA >60 12/23/2018    Hepatic Lab Results  Component Value Date   AST 17 10/21/2018   ALT 21 10/21/2018   ALBUMIN 3.6 10/21/2018   ALKPHOS 81 10/21/2018   LIPASE 20 10/21/2018    Electrolytes Lab Results  Component Value Date   NA 137 12/23/2018   K 4.1 12/23/2018   CL 100 12/23/2018   CALCIUM 9.8 12/23/2018   MG 2.0 10/12/2017    Bone Lab Results  Component Value Date   25OHVITD1 17 (L) 10/12/2017   25OHVITD2 <1.0 10/12/2017   25OHVITD3 17 10/12/2017    Inflammation (CRP: Acute Phase) (ESR: Chronic Phase) Lab Results  Component Value Date   CRP 4 10/12/2017   ESRSEDRATE 12 10/12/2017         Note: Above Lab results reviewed.  Recent Imaging Review  DG Knee 1-2 Views Left CLINICAL DATA:  Chronic left knee  pain  EXAM: LEFT KNEE - 1-2 VIEW  COMPARISON:  None.  FINDINGS: No evidence of fracture, dislocation, or joint effusion. No evidence of arthropathy or other focal bone abnormality. Soft tissues are unremarkable.  IMPRESSION: Negative.  Electronically Signed   By: Davina Poke D.O.   On: 07/11/2020 08:05 DG Lumbar Spine Complete W/Bend CLINICAL DATA:  Chronic low back pain  EXAM: LUMBAR SPINE - COMPLETE WITH BENDING VIEWS  COMPARISON:  12/17/2017  FINDINGS: Vertebral body heights are maintained without evidence of fracture. Slight lumbar dextrocurvature. Grade 1 anterolisthesis L4 on L5 without dynamic instability. Mild multilevel intervertebral disc height loss, most pronounced at L3-4 through L5-S1. Lower lumbar facet arthropathy.  IMPRESSION: Mild multilevel degenerative disc disease and lower lumbar facet arthropathy.  Electronically Signed   By: Davina Poke D.O.   On: 07/11/2020 08:04 Note: Reviewed        Physical Exam  General appearance: Well nourished, well developed, and well hydrated. In no apparent acute distress Mental status: Alert, oriented x 3 (person, place, & time)       Respiratory: No evidence of acute respiratory distress Eyes: PERLA Vitals: BP 103/65 (BP Location: Right Arm, Patient Position: Sitting, Cuff Size: Normal)   Pulse 79   Temp (!) 96.9 F (36.1 C) (Temporal)   Resp 16   Ht $R'4\' 11"'ua$  (1.499 m)   Wt 150 lb (68 kg)   SpO2 100%   BMI 30.30 kg/m  BMI: Estimated body mass index is 30.3 kg/m as calculated from the following:   Height as of this encounter: $RemoveBeforeD'4\' 11"'dAMtjBpFBmKYBp$  (1.499 m).   Weight as of this encounter: 150 lb (68 kg). Ideal:  Patient must be at least 60 in tall to calculate ideal body weight  Assessment   Status Diagnosis  Controlled Controlled Controlled 1. Chronic pain syndrome   2. Chronic ankle and foot pain (1ry area of Pain) (Left)   3. Chronic lower extremity pain (2ry area of Pain) (Bilateral) (L>R)   4.  Chronic low back pain (3ry area of Pain) (Bilateral) (L>R) w/ sciatica (Left)   5. Chronic knee pain (4th area of Pain) (Bilateral) (L>R)   6. Abnormal EMG (07/06/2017)   7. Diabetic sensory polyneuropathy ([place]) (by NCT)   8. DM type 2 with diabetic peripheral neuropathy ([place])   9. Burning sensation of feet   10. Lumbar facet syndrome (Bilateral)   11. 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 Anxiety due to invasive procedure   13. Pharmacologic therapy   14. Chronic use of opiate for therapeutic purpose   15. 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  Ms. HIBA GARRY has a current medication list which includes the following long-term medication(s): carvedilol, dicyclomine, duloxetine, hydralazine, insulin lispro, lisinopril, pantoprazole, pregabalin, sucralfate, amlodipine, baclofen, vitamin d3, toujeo max solostar, magnesium, and tramadol.  Pharmacotherapy (Medications Ordered): Meds ordered this encounter  Medications   diazepam (VALIUM) 5 MG tablet    Sig: Take 1-2 tablets (5-10 mg total) by mouth 60 (sixty) minutes before procedure for 2 doses. Take one (1) tab (5 mg) 60 minutes before scheduled procedure. Wait 30 minutes. If still anxious, take the second (5 mg) tab 30 min before procedure.    Dispense:  2 tablet    Refill:  0    Must have  a driver. Do not drive or operate machinery x 24 hours after taking this medication. Avoid taking within 4 hours of having taken an opioid pain medications.   traMADol (ULTRAM) 50 MG tablet    Sig: Take 1 tablet (50 mg total) by mouth every 8 (eight) hours as needed for severe pain.    Dispense:  90 tablet    Refill:  0    Not a duplicate. Do NOT delete! Dispense 1 day early if closed on fill date. Warn not to take CNS-depressants 8 hours before or after taking opioid. Do not send refill request. Renewal requires appointment.    Orders:  Orders Placed This Encounter  Procedures   LUMBAR FACET(MEDIAL BRANCH NERVE BLOCK) MBNB    Standing Status:   Future    Standing Expiration Date:   10/04/2020    Scheduling Instructions:     Procedure: Lumbar facet block (AKA.: Lumbosacral medial branch nerve block) (without steroids)     Side: Bilateral     Level: L3-4, L4-5, & L5-S1 Facets (L2, L3, L4, L5, & S1 Medial Branch Nerves)     Sedation: Patient's choice.     Timeframe: ASAA    Order  Specific Question:   Where will this procedure be performed?    Answer:   [facility]   Compliance Drug Analysis, Ur    Volume: 30 ml(s). Minimum 3 ml of urine is needed. Document temperature of fresh sample. Indications: Long term (current) use of opiate analgesic (Z79.891) Test#: [id number] (Comprehensive Profile)    Order Specific Question:   Release to patient    Answer:   Immediate   Informed Consent Details: Physician/Practitioner Attestation; Transcribe to consent form and obtain patient signature    Nursing Order: Transcribe to consent form and obtain patient signature. Note: Always confirm laterality of pain with [name], before procedure.    Order Specific Question:   Physician/Practitioner attestation of informed consent for procedure/surgical case    Answer:   I, the physician/practitioner, attest that I have discussed with the patient the benefits, risks, side effects, alternatives,  likelihood of achieving goals and potential problems during recovery for the procedure that I have provided informed consent.    Order Specific Question:   Procedure    Answer:   Lumbar Facet Block  under fluoroscopic guidance    Order Specific Question:   Physician/Practitioner performing the procedure    Answer:   Kenyan Karnes A. Dossie Arbour MD    Order Specific Question:   Indication/Reason    Answer:   Low Back Pain, with our without leg pain, due to Facet Joint Arthralgia (Joint Pain) Spondylosis (Arthritis of the Spine), without myelopathy or radiculopathy (Nerve Damage).   Nursing Instructions:    1. Medication Agreement: Please go over agreement with the patient. Have the patient read and sign the agreement. Provide patient with a copy of the signed agreement. 2. Make sure that the patient has completed the ORT (Opioid Risk Tool). 3. Provide the patient with a copy of our "Medicatiion Policy", "Medication Recommendations and Reminders", and "CBD information". 4. Remind the patient to always bring their medications and medication bottles (even if empty) to all appointments except for procedure appointments.    Scheduling Instructions:     Sign "Medication Agreement", complete "Opioid Risk Tool", inform patient of our practice "Medication Policies" (Pill counts, always bring bottles, except on procedure days).   Nursing communication    Scheduling Instructions:     Complete/update the opioid risk tool (ORT) questionnaire.    Follow-up plan:   Return for (PO-sed) procedure: (B) L-FCT BLK (no steroids).     Interventional management options:  Considering:   NOTE: UNCONTROLLED IDDM (NO STEROIDS)  Diagnostic left LSB  Possible left-sided lumbar sympathetic RFA  Diagnostic right L5 TFESI  Diagnostic bilateral lumbar facet block  Possible bilateral lumbar facet RFA  Diagnostic bilateral IA knee joint injection (w/ L.A. + steroids)  Diagnostic bilateral genicular NB  Possible bilateral  genicular nerve RFA    Palliative PRN treatment(s):   Palliative left L5 TFESI #4 + left L4-5 interlaminar LESI #4  Palliative left L5 TFESI #4 Palliative left L4-5 interlaminar LESI #4  Palliative left L5-S1 LESI #3  Palliative bilateral Hyalgan knee series S2N1 (last done on 02/25/2018)    Recent Visits Date Type Provider Dept  07/09/20 Office Visit Milinda Pointer, MD [facility]  Showing recent visits within past 90 days and meeting all other requirements Today's Visits Date Type Provider Dept  09/03/20 Office Visit Milinda Pointer, MD [facility]  Showing today's visits and meeting all other requirements Future Appointments No visits were found meeting these conditions. Showing future appointments within next 90 days and meeting  all other requirements I discussed the assessment and treatment plan with the patient. The patient was provided an opportunity to ask questions and all were answered. The patient agreed with the plan and demonstrated an understanding of the instructions.  Patient advised to call back or seek an in-person evaluation if the symptoms or condition worsens.  Duration of encounter: 30 minutes.  Note by: Gaspar Cola, MD Date: 09/03/2020; Time: 12:38 PM

## 2020-09-03 ENCOUNTER — Other Ambulatory Visit: Payer: Self-pay

## 2020-09-03 ENCOUNTER — Ambulatory Visit: Payer: Medicare Other | Attending: Pain Medicine | Admitting: Pain Medicine

## 2020-09-03 ENCOUNTER — Encounter: Payer: Self-pay | Admitting: Pain Medicine

## 2020-09-03 VITALS — BP 103/65 | HR 79 | Temp 96.9°F | Resp 16 | Ht 59.0 in | Wt 150.0 lb

## 2020-09-03 DIAGNOSIS — M4316 Spondylolisthesis, lumbar region: Secondary | ICD-10-CM | POA: Diagnosis present

## 2020-09-03 DIAGNOSIS — G8929 Other chronic pain: Secondary | ICD-10-CM | POA: Insufficient documentation

## 2020-09-03 DIAGNOSIS — M5442 Lumbago with sciatica, left side: Secondary | ICD-10-CM | POA: Insufficient documentation

## 2020-09-03 DIAGNOSIS — F419 Anxiety disorder, unspecified: Secondary | ICD-10-CM | POA: Insufficient documentation

## 2020-09-03 DIAGNOSIS — M25572 Pain in left ankle and joints of left foot: Secondary | ICD-10-CM | POA: Diagnosis present

## 2020-09-03 DIAGNOSIS — Z79899 Other long term (current) drug therapy: Secondary | ICD-10-CM

## 2020-09-03 DIAGNOSIS — M79604 Pain in right leg: Secondary | ICD-10-CM | POA: Diagnosis present

## 2020-09-03 DIAGNOSIS — G894 Chronic pain syndrome: Secondary | ICD-10-CM | POA: Diagnosis present

## 2020-09-03 DIAGNOSIS — Z79891 Long term (current) use of opiate analgesic: Secondary | ICD-10-CM | POA: Diagnosis present

## 2020-09-03 DIAGNOSIS — R208 Other disturbances of skin sensation: Secondary | ICD-10-CM | POA: Insufficient documentation

## 2020-09-03 DIAGNOSIS — M79605 Pain in left leg: Secondary | ICD-10-CM | POA: Diagnosis present

## 2020-09-03 DIAGNOSIS — M25562 Pain in left knee: Secondary | ICD-10-CM | POA: Insufficient documentation

## 2020-09-03 DIAGNOSIS — M47816 Spondylosis without myelopathy or radiculopathy, lumbar region: Secondary | ICD-10-CM | POA: Insufficient documentation

## 2020-09-03 DIAGNOSIS — R94131 Abnormal electromyogram [EMG]: Secondary | ICD-10-CM

## 2020-09-03 DIAGNOSIS — E1142 Type 2 diabetes mellitus with diabetic polyneuropathy: Secondary | ICD-10-CM | POA: Diagnosis present

## 2020-09-03 DIAGNOSIS — M25561 Pain in right knee: Secondary | ICD-10-CM

## 2020-09-03 MED ORDER — TRAMADOL HCL 50 MG PO TABS: 50.0000 mg | ORAL_TABLET | Freq: Three times a day (TID) | ORAL | 0 refills | Status: DC | PRN

## 2020-09-03 MED ORDER — DIAZEPAM 5 MG PO TABS: 5.0000 mg | ORAL_TABLET | ORAL | 0 refills | Status: DC

## 2020-09-03 NOTE — Patient Instructions (Signed)
______________________________________________________________________  Preparing for Procedure with Oral Anxiolytics  Definition: Anxiolytics - Medications that provide muscle relaxation and decrease anxiety.  Procedure appointments are limited to planned procedures: No Prescription Refills. No disability issues will be discussed. No medication changes will be discussed.  Instructions: Oral Intake: Do not eat or drink anything for at least 6 hours prior to your procedure. (Exception: Blood Pressure Medication. See below.)  Anxiolytic Medication: This medication is meant to relax you during your procedure. DO NOT DRIVE OR OPERATE DANGEROUS MACHINERY WHILE UNDER THE INFLUENCE OF THIS MEDICATION. Take the oral medication (i.e.: Valium) as prescribed on the day of your procedure with just a sip of water. Prescription will be sent to your pharmacy, prior to the day of your procedure. Topical anesthetic: Your physician might have recommended a topical anesthetic/analgesic to apply over the area where the procedure will be done. If so, apply to area 1 hour prior to procedure. For exact location of application, ask your healthcare provider.    Transportation: A driver is required. You may not drive yourself after the procedure. Blood Pressure Medicine: Do not forget to take your blood pressure medicine with a sip of water the morning of the procedure. If your Diastolic (lower reading) is above 100 mmHg, elective cases will be cancelled/rescheduled. Blood thinners: These will need to be stopped for procedures. Notify our staff if you are taking any blood thinners. Depending on which one you take, there will be specific instructions on how and when to stop it. Diabetics on insulin: Notify the staff so that you can be scheduled 1st case in the morning. If your diabetes requires high dose insulin, take only  of your normal insulin dose the morning of the procedure and notify the staff that you have done  so. Preventing infections: Shower with an antibacterial soap the morning of your procedure. Build-up your immune system: Take 1000 mg of Vitamin C with every meal (3 times a day) the day prior to your procedure. Antibiotics: Inform the staff if you have a condition or reason that requires you to take antibiotics before dental procedures. Pregnancy: If you are pregnant, call and cancel the procedure. Sickness: If you have a cold, fever, or any active infections, call and cancel the procedure. Arrival: You must be in the facility at least 30 minutes prior to your scheduled procedure. Children: Do not bring children with you. Dress appropriately: Bring dark clothing that you would not mind if they get stained. Valuables: Do not bring any jewelry or valuables.  Reasons to call and reschedule or cancel your procedure:  NOTE: Following these recommendations will minimize the risk of a serious complication. Surgeries: Avoid having procedures within 2 weeks of any surgery. (Avoid for 2 weeks before or after any surgery). Flu Shots: Avoid having procedures within 2 weeks of a flu shots. (Avoid for 2 weeks before or after immunizations). Barium: Avoid having a procedure within 7-10 days after having had a radiological study involving the use of radiological contrast. (Myelograms, Barium swallow or enema study). Heart attacks: Avoid any elective procedures or surgeries for the initial 6 months after a "Myocardial Infarction" (Heart Attack). Blood thinners: It is imperative that you stop these medications before procedures. Let us know if you if you take any blood thinner.  Infection: Avoid procedures during or within two weeks of an infection (including chest colds or gastrointestinal problems). Symptoms associated with infections include: Localized redness, fever, chills, night sweats or profuse sweating, burning sensation when voiding, cough, congestion,  stuffiness, runny nose, sore throat, diarrhea,  nausea, vomiting, cold or Flu symptoms, recent or current infections. It is especially important if the infection is over the area that we intend to treat. Heart and lung problems: Symptoms that may suggest an active cardiopulmonary problem include: cough, chest pain, breathing difficulties or shortness of breath, dizziness, ankle swelling, uncontrolled high or unusually low blood pressure, and/or palpitations. If you are experiencing any of these symptoms, cancel your procedure and contact your primary care physician for an evaluation.  Remember:  Regular Business hours are:  Monday to Thursday 8:00 AM to 4:00 PM  Provider's Schedule: Charlene Pointer, MD:  Procedure days: Tuesday and Thursday 7:30 AM to 4:00 PM  Charlene Santa, MD:  Procedure days: Monday and Wednesday 7:30 AM to 4:00 PM ______________________________________________________________________  ____________________________________________________________________________________________  General Risks and Possible Complications  Patient Responsibilities: It is important that you read this as it is part of your informed consent. It is our duty to inform you of the risks and possible complications associated with treatments offered to you. It is your responsibility as a patient to read this and to ask questions about anything that is not clear or that you believe was not covered in this document.  Patient's Rights: You have the right to refuse treatment. You also have the right to change your mind, even after initially having agreed to have the treatment done. However, under this last option, if you wait until the last second to change your mind, you may be charged for the materials used up to that point.  Introduction: Medicine is not an Chief Strategy Officer. Everything in Medicine, including the lack of treatment(s), carries the potential for danger, harm, or loss (which is by definition: Risk). In Medicine, a complication is a secondary  problem, condition, or disease that can aggravate an already existing one. All treatments carry the risk of possible complications. The fact that a side effects or complications occurs, does not imply that the treatment was conducted incorrectly. It must be clearly understood that these can happen even when everything is done following the highest safety standards.  No treatment: You can choose not to proceed with the proposed treatment alternative. The "PRO(s)" would include: avoiding the risk of complications associated with the therapy. The "CON(s)" would include: not getting any of the treatment benefits. These benefits fall under one of three categories: diagnostic; therapeutic; and/or palliative. Diagnostic benefits include: getting information which can ultimately lead to improvement of the disease or symptom(s). Therapeutic benefits are those associated with the successful treatment of the disease. Finally, palliative benefits are those related to the decrease of the primary symptoms, without necessarily curing the condition (example: decreasing the pain from a flare-up of a chronic condition, such as incurable terminal cancer).  General Risks and Complications: These are associated to most interventional treatments. They can occur alone, or in combination. They fall under one of the following six (6) categories: no benefit or worsening of symptoms; bleeding; infection; nerve damage; allergic reactions; and/or death. No benefits or worsening of symptoms: In Medicine there are no guarantees, only probabilities. No healthcare provider can ever guarantee that a medical treatment will work, they can only state the probability that it may. Furthermore, there is always the possibility that the condition may worsen, either directly, or indirectly, as a consequence of the treatment. Bleeding: This is more common if the patient is taking a blood thinner, either prescription or over the counter (example: Goody  Powders, Fish oil, Aspirin, Garlic, etc.),  or if suffering a condition associated with impaired coagulation (example: Hemophilia, cirrhosis of the liver, low platelet counts, etc.). However, even if you do not have one on these, it can still happen. If you have any of these conditions, or take one of these drugs, make sure to notify your treating physician. Infection: This is more common in patients with a compromised immune system, either due to disease (example: diabetes, cancer, human immunodeficiency virus [HIV], etc.), or due to medications or treatments (example: therapies used to treat cancer and rheumatological diseases). However, even if you do not have one on these, it can still happen. If you have any of these conditions, or take one of these drugs, make sure to notify your treating physician. Nerve Damage: This is more common when the treatment is an invasive one, but it can also happen with the use of medications, such as those used in the treatment of cancer. The damage can occur to small secondary nerves, or to large primary ones, such as those in the spinal cord and brain. This damage may be temporary or permanent and it may lead to impairments that can range from temporary numbness to permanent paralysis and/or brain death. Allergic Reactions: Any time a substance or material comes in contact with our body, there is the possibility of an allergic reaction. These can range from a mild skin rash (contact dermatitis) to a severe systemic reaction (anaphylactic reaction), which can result in death. Death: In general, any medical intervention can result in death, most of the time due to an unforeseen complication. ____________________________________________________________________________________________ ____________________________________________________________________________________________  Medication Rules  Purpose: To inform patients, and their family members, of our rules and  regulations.  Applies to: All patients receiving prescriptions (written or electronic).  Pharmacy of record: Pharmacy where electronic prescriptions will be sent. If written prescriptions are taken to a different pharmacy, please inform the nursing staff. The pharmacy listed in the electronic medical record should be the one where you would like electronic prescriptions to be sent.  Electronic prescriptions: In compliance with the [place] (STOP) Act of 2017 (Session Lanny Cramp [phone]), effective January 20, 2018, all controlled substances must be electronically prescribed. Calling prescriptions to the pharmacy will cease to exist.  Prescription refills: Only during scheduled appointments. Applies to all prescriptions.  NOTE: The following applies primarily to controlled substances (Opioid* Pain Medications).   Type of encounter (visit): For patients receiving controlled substances, face-to-face visits are required. (Not an option or up to the patient.)  Patient's responsibilities: Pain Pills: Bring all pain pills to every appointment (except for procedure appointments). Pill Bottles: Bring pills in original pharmacy bottle. Always bring the newest bottle. Bring bottle, even if empty. Medication refills: You are responsible for knowing and keeping track of what medications you take and those you need refilled. The day before your appointment: write a list of all prescriptions that need to be refilled. The day of the appointment: give the list to the admitting nurse. Prescriptions will be written only during appointments. No prescriptions will be written on procedure days. If you forget a medication: it will not be "Called in", "Faxed", or "electronically sent". You will need to get another appointment to get these prescribed. No early refills. Do not call asking to have your prescription filled early. Prescription Accuracy: You are responsible for  carefully inspecting your prescriptions before leaving our office. Have the discharge nurse carefully go over each prescription with you, before taking them home. Make sure that your  name is accurately spelled, that your address is correct. Check the name and dose of your medication to make sure it is accurate. Check the number of pills, and the written instructions to make sure they are clear and accurate. Make sure that you are given enough medication to last until your next medication refill appointment. Taking Medication: Take medication as prescribed. When it comes to controlled substances, taking less pills or less frequently than prescribed is permitted and encouraged. Never take more pills than instructed. Never take medication more frequently than prescribed.  Inform other Doctors: Always inform, all of your healthcare providers, of all the medications you take. Pain Medication from other Providers: You are not allowed to accept any additional pain medication from any other Doctor or Healthcare provider. There are two exceptions to this rule. (see below) In the event that you require additional pain medication, you are responsible for notifying us, as stated below. Cough Medicine: Often these contain an opioid, such as codeine or hydrocodone. Never accept or take cough medicine containing these opioids if you are already taking an opioid* medication. The combination may cause respiratory failure and death. Medication Agreement: You are responsible for carefully reading and following our Medication Agreement. This must be signed before receiving any prescriptions from our practice. Safely store a copy of your signed Agreement. Violations to the Agreement will result in no further prescriptions. (Additional copies of our Medication Agreement are available upon request.) Laws, Rules, & Regulations: All patients are expected to follow all Federal and [employer], [employer], Rules, [employer]. Ignorance  of the Laws does not constitute a valid excuse.  Illegal drugs and Controlled Substances: The use of illegal substances (including, but not limited to marijuana and its derivatives) and/or the illegal use of any controlled substances is strictly prohibited. Violation of this rule may result in the immediate and permanent discontinuation of any and all prescriptions being written by our practice. The use of any illegal substances is prohibited. Adopted CDC guidelines & recommendations: Target dosing levels will be at or below 60 MME/day. Use of benzodiazepines** is not recommended.  Exceptions: There are only two exceptions to the rule of not receiving pain medications from other Healthcare Providers. Exception #1 (Emergencies): In the event of an emergency (i.e.: accident requiring emergency care), you are allowed to receive additional pain medication. However, you are responsible for: As soon as you are able, call our office [phone], at any time of the day or night, and leave a message stating your name, the date and nature of the emergency, and the name and dose of the medication prescribed. In the event that your call is answered by a member of our staff, make sure to document and save the date, time, and the name of the person that took your information.  Exception #2 (Planned Surgery): In the event that you are scheduled by another doctor or dentist to have any type of surgery or procedure, you are allowed (for a period no longer than 30 days), to receive additional pain medication, for the acute post-op pain. However, in this case, you are responsible for picking up a copy of our "Post-op Pain Management for Surgeons" handout, and giving it to your surgeon or dentist. This document is available at our office, and does not require an appointment to obtain it. Simply go to our office during business hours (Monday-Thursday from 8:00 AM to 4:00 PM) (Friday 8:00 AM to 12:00 Noon) or if you have a  scheduled appointment  with [place], prior to your surgery, and ask for it by name. In addition, you are responsible for: calling our office [phone], at any time of the day or night, and leaving a message stating your name, name of your surgeon, type of surgery, and date of procedure or surgery. Failure to comply with your responsibilities may result in termination of therapy involving the controlled substances.  *Opioid medications include: morphine, codeine, oxycodone, oxymorphone, hydrocodone, hydromorphone, meperidine, tramadol, tapentadol, buprenorphine, fentanyl, methadone. **Benzodiazepine medications include: diazepam (Valium), alprazolam (Xanax), clonazepam (Klonopine), lorazepam (Ativan), clorazepate (Tranxene), chlordiazepoxide (Librium), estazolam (Prosom), oxazepam (Serax), temazepam (Restoril), triazolam (Halcion) (Last updated: 12/19/2019) ____________________________________________________________________________________________  ____________________________________________________________________________________________  Medication Recommendations and Reminders  Applies to: All patients receiving prescriptions (written and/or electronic).  Medication Rules & Regulations: These rules and regulations exist for your safety and that of others. They are not flexible and neither are we. Dismissing or ignoring them will be considered "non-compliance" with medication therapy, resulting in complete and irreversible termination of such therapy. (See document titled "Medication Rules" for more details.) In all conscience, because of safety reasons, we cannot continue providing a therapy where the patient does not follow instructions.  Pharmacy of record:  Definition: This is the pharmacy where your electronic prescriptions will be sent.  We do not endorse any particular pharmacy, however, we have experienced problems with [facility] not securing enough medication supply for the community. We  do not restrict you in your choice of pharmacy. However, once we write for your prescriptions, we will NOT be re-sending more prescriptions to fix restricted supply problems created by your pharmacy, or your insurance.  The pharmacy listed in the electronic medical record should be the one where you want electronic prescriptions to be sent. If you choose to change pharmacy, simply notify our nursing staff.  Recommendations: Keep all of your pain medications in a safe place, under lock and key, even if you live alone. We will NOT replace lost, stolen, or damaged medication. After you fill your prescription, take 1 week's worth of pills and put them away in a safe place. You should keep a separate, properly labeled bottle for this purpose. The remainder should be kept in the original bottle. Use this as your primary supply, until it runs out. Once it's gone, then you know that you have 1 week's worth of medicine, and it is time to come in for a prescription refill. If you do this correctly, it is unlikely that you will ever run out of medicine. To make sure that the above recommendation works, it is very important that you make sure your medication refill appointments are scheduled at least 1 week before you run out of medicine. To do this in an effective manner, make sure that you do not leave the office without scheduling your next medication management appointment. Always ask the nursing staff to show you in your prescription , when your medication will be running out. Then arrange for the receptionist to get you a return appointment, at least 7 days before you run out of medicine. Do not wait until you have 1 or 2 pills left, to come in. This is very poor planning and does not take into consideration that we may need to cancel appointments due to bad weather, sickness, or emergencies affecting our staff. DO NOT ACCEPT A "Partial Fill": If for any reason your pharmacy does not have enough pills/tablets to  completely fill or refill your prescription, do not allow for a "partial fill". The law  allows the pharmacy to complete that prescription within 72 hours, without requiring a new prescription. If they do not fill the rest of your prescription within those 72 hours, you will need a separate prescription to fill the remaining amount, which we will NOT provide. If the reason for the partial fill is your insurance, you will need to talk to the pharmacist about payment alternatives for the remaining tablets, but again, DO NOT ACCEPT A PARTIAL FILL, unless you can trust your pharmacist to obtain the remainder of the pills within 72 hours.  Prescription refills and/or changes in medication(s):  Prescription refills, and/or changes in dose or medication, will be conducted only during scheduled medication management appointments. (Applies to both, written and electronic prescriptions.) No refills on procedure days. No medication will be changed or started on procedure days. No changes, adjustments, and/or refills will be conducted on a procedure day. Doing so will interfere with the diagnostic portion of the procedure. No phone refills. No medications will be "called into the pharmacy". No Fax refills. No weekend refills. No Holliday refills. No after hours refills.  Remember:  Business hours are:  Monday to Thursday 8:00 AM to 4:00 PM Provider's Schedule: Charlene Pointer, MD - Appointments are:  Medication management: Monday and Wednesday 8:00 AM to 4:00 PM Procedure day: Tuesday and Thursday 7:30 AM to 4:00 PM Charlene Santa, MD - Appointments are:  Medication management: Tuesday and Thursday 8:00 AM to 4:00 PM Procedure day: Monday and Wednesday 7:30 AM to 4:00 PM (Last update: 08/10/2019) ____________________________________________________________________________________________  ____________________________________________________________________________________________  CBD (cannabidiol)  WARNING  Applicable to: All individuals currently taking or considering taking CBD (cannabidiol) and, more important, all patients taking opioid analgesic controlled substances (pain medication). (Example: oxycodone; oxymorphone; hydrocodone; hydromorphone; morphine; methadone; tramadol; tapentadol; fentanyl; buprenorphine; butorphanol; dextromethorphan; meperidine; codeine; etc.)  Legal status: CBD remains a Schedule I drug prohibited for any use. CBD is illegal with one exception. In the [place], CBD has a limited Transport planner (FDA) approval for the treatment of two specific types of epilepsy disorders. Only one CBD product has been approved by the FDA for this purpose: "Epidiolex". FDA is aware that some companies are marketing products containing cannabis and cannabis-derived compounds in ways that violate the [employer], Drug and Cosmetic Act [facility] Act) and that may put the health and safety of consumers at risk. The FDA, a Federal agency, has not enforced the CBD status since 2018.   Legality: Some manufacturers ship CBD products nationally, which is illegal. Often such products are sold online and are therefore available throughout the country. CBD is openly sold in head shops and health food stores in some states where such sales have not been explicitly legalized. Selling unapproved products with unsubstantiated therapeutic claims is not only a violation of the law, but also can put patients at risk, as these products have not been proven to be safe or effective. Federal illegality makes it difficult to conduct research on CBD.  Reference: "FDA Regulation of Cannabis and Cannabis-Derived Products, Including Cannabidiol (CBD)" - [employer]  Warning: CBD is not FDA approved and has not undergo the same manufacturing controls as prescription drugs.  This  means that the purity and safety of available CBD may be questionable. Most of the time, despite manufacturer's claims, it is contaminated with THC (delta-9-tetrahydrocannabinol - the chemical in marijuana responsible for the "HIGH").  When this is the case, the [facility] contaminant will trigger a positive urine drug screen (UDS) test for Marijuana (carboxy-THC).  Because a positive UDS for any illicit substance is a violation of our medication agreement, your opioid analgesics (pain medicine) may be permanently discontinued.  MORE ABOUT CBD  General Information: CBD  is a derivative of the Marijuana (cannabis sativa) plant discovered in 24. It is one of the 113 identified substances found in Marijuana. It accounts for up to 40% of the plant's extract. As of 2018, preliminary clinical studies on CBD included research for the treatment of anxiety, movement disorders, and pain. CBD is available and consumed in multiple forms, including inhalation of smoke or vapor, as an aerosol spray, and by mouth. It may be supplied as an oil containing CBD, capsules, dried cannabis, or as a liquid solution. CBD is thought not to be as psychoactive as THC (delta-9-tetrahydrocannabinol - the chemical in marijuana responsible for the "HIGH"). Studies suggest that CBD may interact with different biological target receptors in the body, including cannabinoid and other neurotransmitter receptors. As of 2018 the mechanism of action for its biological effects has not been determined.  Side-effects  Adverse reactions: Dry mouth, diarrhea, decreased appetite, fatigue, drowsiness, malaise, weakness, sleep disturbances, and others.  Drug interactions: CBC may interact with other medications such as blood-thinners. (Last update: 08/27/2019) ____________________________________________________________________________________________  ____________________________________________________________________________________________  Drug  Holidays (Slow)  What is a "Drug Holiday"? Drug Holiday: is the name given to the period of time during which a patient stops taking a medication(s) for the purpose of eliminating tolerance to the drug.  Benefits Improved effectiveness of opioids. Decreased opioid dose needed to achieve benefits. Improved pain with lesser dose.  What is tolerance? Tolerance: is the progressive decreased in effectiveness of a drug due to its repetitive use. With repetitive use, the body gets use to the medication and as a consequence, it loses its effectiveness. This is a common problem seen with opioid pain medications. As a result, a larger dose of the drug is needed to achieve the same effect that used to be obtained with a smaller dose.  How long should a "Drug Holiday" last? You should stay off of the pain medicine for at least 14 consecutive days. (2 weeks)  Should I stop the medicine "cold [place]"? No. You should always coordinate with your Pain Specialist so that he/she can provide you with the correct medication dose to make the transition as smoothly as possible.  How do I stop the medicine? Slowly. You will be instructed to decrease the daily amount of pills that you take by one (1) pill every seven (7) days. This is called a "slow downward taper" of your dose. For example: if you normally take four (4) pills per day, you will be asked to drop this dose to three (3) pills per day for seven (7) days, then to two (2) pills per day for seven (7) days, then to one (1) per day for seven (7) days, and at the end of those last seven (7) days, this is when the "Drug Holiday" would start.   Will I have withdrawals? By doing a "slow downward taper" like this one, it is unlikely that you will experience any significant withdrawal symptoms. Typically, what triggers withdrawals is the sudden stop of a high dose opioid therapy. Withdrawals can usually be avoided by slowly decreasing the dose over a prolonged  period of time. If you do not follow these instructions and decide to stop your medication abruptly, withdrawals may be possible.  What are withdrawals? Withdrawals: refers to the wide range of symptoms that occur  after stopping or dramatically reducing opiate drugs after heavy and prolonged use. Withdrawal symptoms do not occur to patients that use low dose opioids, or those who take the medication sporadically. Contrary to benzodiazepine (example: Valium, Xanax, etc.) or alcohol withdrawals ("Delirium Tremens"), opioid withdrawals are not lethal. Withdrawals are the physical manifestation of the body getting rid of the excess receptors.  Expected Symptoms Early symptoms of withdrawal may include: Agitation Anxiety Muscle aches Increased tearing Insomnia Runny nose Sweating Yawning  Late symptoms of withdrawal may include: Abdominal cramping Diarrhea Dilated pupils Goose bumps Nausea Vomiting  Will I experience withdrawals? Due to the slow nature of the taper, it is very unlikely that you will experience any.  What is a slow taper? Taper: refers to the gradual decrease in dose.  (Last update: 08/10/2019) ____________________________________________________________________________________________    ______________________________________________________________________________________________  Body mass index (BMI)  Body mass index (BMI) is a common tool for deciding whether a person has an appropriate body weight.  It measures a persons weight in relation to their height.   According to the [facility] of health (NIH): A BMI of less than 18.5 means that a person is underweight. A BMI of between 18.5 and 24.9 is ideal. A BMI of between 25 and 29.9 is overweight. A BMI over 30 indicates obesity.  Weight Management Required  URGENT: Your weight has been found to be adversely affecting your health.  Dear Charlene [name]:  Your current Estimated body mass index is 30.3  kg/m as calculated from the following:   Height as of this encounter: _0  (1.499 m).   Weight as of this encounter: 150 lb (68 kg).  Please use the table below to identify your weight category and associated incidence of chronic pain, secondary to your weight.  Body Mass Index (BMI) Classification BMI level (kg/m2) Category Associated incidence of chronic pain  <18  Underweight   18.5-24.9 Ideal body weight   25-29.9 Overweight  20%  30-34.9 Obese (Class I)  68%  35-39.9 Severe obesity (Class II)  136%  >40 Extreme obesity (Class III)  254%   In addition: You will be considered "Morbidly Obese", if your BMI is above 30 and you have one or more of the following conditions which are known to be caused and/or directly associated with obesity: 1.    Type 2 Diabetes (Which in turn can lead to cardiovascular diseases (CVD), stroke, peripheral vascular diseases (PVD), retinopathy, nephropathy, and neuropathy) 2.    Cardiovascular Disease (High Blood Pressure; Congestive Heart Failure; High Cholesterol; Coronary Artery Disease; Angina; or History of Heart Attacks) 3.    Breathing problems (Asthma; obesity-hypoventilation syndrome; obstructive sleep apnea; chronic inflammatory airway disease; reactive airway disease; or shortness of breath) 4.    Chronic kidney disease 5.    Liver disease (nonalcoholic fatty liver disease) 6.    High blood pressure 7.    Acid reflux (gastroesophageal reflux disease; heartburn) 8.    Osteoarthritis (OA) (with any of the following: hip pain; knee pain; and/or low back pain) 9.    Low back pain (Lumbar Facet Syndrome; and/or Degenerative Disc Disease) 10.  Hip pain (Osteoarthritis of hip) (For every 1 lbs of added body weight, there is a 2 lbs increase in pressure inside of each hip articulation. 1:2 mechanical relationship) 11.  Knee pain (Osteoarthritis of knee) (For every 1 lbs of added body weight, there is a 4 lbs increase in pressure inside of each knee  articulation. 1:4 mechanical relationship) (patients with a BMI>30  kg/m2 were 6.8 times more likely to develop knee OA than normal-weight individuals) 12.  Cancer: Epidemiological studies have shown that obesity is a risk factor for: post-menopausal breast cancer; cancers of the endometrium, colon and kidney cancer; malignant adenomas of the oesophagus. Obese subjects have an approximately 1.5-3.5-fold increased risk of developing these cancers compared with normal-weight subjects, and it has been estimated that between 15 and 45% of these cancers can be attributed to overweight. More recent studies suggest that obesity may also increase the risk of other types of cancer, including pancreatic, hepatic and gallbladder cancer. (Ref: Obesity and cancer. Pischon T, Nthlings U, [employer] H. Proc Nutr Soc. 2008 May;67(2):128-45. doi: 47.8295/[id number].) The International Agency for Research on Cancer (IARC) has identified 13 cancers associated with overweight and obesity: meningioma, multiple myeloma, adenocarcinoma of the esophagus, and cancers of the thyroid, postmenopausal breast cancer, gallbladder, stomach, liver, pancreas, kidney, ovaries, uterus, colon and rectal (colorectal) cancers. 1 percent of all cancers diagnosed in women and 24 percent of those diagnosed in men are associated with overweight and obesity.  Recommendation: At this point it is urgent that you take a step back and concentrate in loosing weight. Dedicate 100% of your efforts on this task. Nothing else will improve your health more than bringing your weight down and your BMI to less than 30. If you are here, you probably have chronic pain. We know that most chronic pain patients have difficulty exercising secondary to their pain. For this reason, you must rely on proper nutrition and diet in order to lose the weight. If your BMI is above 40, you should seriously consider bariatric surgery. A realistic goal is to lose 10% of your body  weight over a period of 12 months.  Be honest to yourself, if over time you have unsuccessfully tried to lose weight, then it is time for you to seek professional help and to enter a medically supervised weight management program, and/or undergo bariatric surgery. Stop procrastinating.   Pain management considerations:  1.    Pharmacological Problems: Be advised that the use of opioid analgesics (oxycodone; hydrocodone; morphine; methadone; codeine; and all of their derivatives) have been associated with decreased metabolism and weight gain.  For this reason, should we see that you are unable to lose weight while taking these medications, it may become necessary for [place] to taper down and indefinitely discontinue them.  2.    Technical Problems: The incidence of successful interventional therapies decreases as the patient's BMI increases. It is much more difficult to accomplish a safe and effective interventional therapy on a patient with a BMI above 35. 3.    Radiation Exposure Problems: The x-rays machine, used to accomplish injection therapies, will automatically increase their x-ray output in order to capture an appropriate bone image. This means that radiation exposure increases exponentially with the patient's BMI. (The higher the BMI, the higher the radiation exposure.) Although the level of radiation used at a given time is still safe to the patient, it is not for the physician and/or assisting staff. Unfortunately, radiation exposure is accumulative. Because physicians and the staff have to do procedures and be exposed on a daily basis, this can result in health problems such as cancer and radiation burns. Radiation exposure to the staff is monitored by the radiation batches that they wear. The exposure levels are reported back to the staff on a quarterly basis. Depending on levels of exposure, physicians and staff may be obligated by law to decrease this exposure. This  means that they have the right and  obligation to refuse providing therapies where they may be overexposed to radiation. For this reason, physicians may decline to offer therapies such as radiofrequency ablation or implants to patients with a BMI above 40. 4.    Current Trends: Be advised that the current trend is to no longer offer certain therapies to patients with a BMI equal to, or above 35, due to increase perioperative risks, increased technical procedural difficulties, and excessive radiation exposure to healthcare personnel.  ______________________________________________________________________________________________

## 2020-09-03 NOTE — Progress Notes (Signed)
Nursing Pain Medication Assessment:  Safety precautions to be maintained throughout the outpatient stay will include: orient to surroundings, keep bed in low position, maintain call bell within reach at all times, provide assistance with transfer out of bed and ambulation.  Medication Inspection Compliance: Ms. Bouknight did not comply with our request to bring her pills to be counted. She was reminded that bringing the medication bottles, even when empty, is a requirement.  Medication: None brought in. Pill/Patch Count: None available to be counted. Bottle Appearance: No container available. Did not bring bottle(s) to appointment. Filled Date: N/A Last Medication intake:   2 months Safety precautions to be maintained throughout the outpatient stay will include: orient to surroundings, keep bed in low position, maintain call bell within reach at all times, provide assistance with transfer out of bed and ambulation.

## 2020-09-06 LAB — COMPLIANCE DRUG ANALYSIS, UR

## 2020-10-06 NOTE — Progress Notes (Signed)
PROVIDER NOTE: Information contained herein reflects review and annotations entered in association with encounter. Interpretation of such information and data should be left to medically-trained personnel. Information provided to patient can be located elsewhere in the medical record under "Patient Instructions". Document created using STT-dictation technology, any transcriptional errors that may result from process are unintentional.    Patient: Charlene [name]  Service Category: Procedure  Provider: Gaspar Cola, MD  DOB: Dec 27, 1960  DOS: 10/09/2020  Location: [facility]  MRN: [id number]  Setting: Ambulatory - outpatient  Referring Provider: Milinda Pointer, MD  Type: Established Patient  Specialty: Interventional Pain Management  PCP: Tracie Harrier, MD   Primary Reason for Visit: Interventional Pain Management Treatment. CC: Back Pain    Procedure:          Anesthesia, Analgesia, Anxiolysis:  Type: Lumbar Facet, Medial Branch Block(s) #1 Primary Purpose: Diagnostic Region: Posterolateral Lumbosacral Spine Level: L2, L3, L4, L5, & S1 Medial Branch Level(s). Injecting these levels blocks the L3-4, L4-5, and L5-S1 lumbar facet joints. Laterality: Bilateral  Type: Local Anesthesia Local Anesthetic: Lidocaine 1-2% Sedation: Minimal Anxiolysis  Indication(s): Anxiety & Analgesia Route: Infiltration ([place]/IM) IV Access: Available   Position: Prone   Indications: 1. Lumbar facet syndrome (Bilateral)   2. Lumbar facet hypertrophy   3. Lumbar facet arthropathy   4. Arthropathy of lumbar facet joint   5. Spondylosis without myelopathy or radiculopathy, lumbosacral region   6. DDD (degenerative disc disease), lumbar   7. Currently low back pain (Bilateral) w/o sciatica   8. Uncontrolled type 2 diabetes mellitus with hyperglycemia (HCC)    Pain Score: Pre-procedure: 1 /10 Post-procedure: 0-No pain/10     Pre-op H&P Assessment:  Charlene [name] is a 60 y.o. (year  old), female patient, seen today for interventional treatment. She  has a past surgical history that includes arthroscopic rotator cuff; Colonoscopy; Abdominal hysterectomy; endosopic sinus; and Cataract extraction w/PHACO (Right, 11/25/2017). Charlene [name] has a current medication list which includes the following prescription(s): alendronate, carvedilol, xigduo xr, diazepam, dicyclomine, duloxetine, hydralazine, toujeo max solostar, insulin lispro, lidocaine-menthol, lisinopril, metformin, pantoprazole, polyethylene glycol, pregabalin, sucralfate, amlodipine, baclofen, vitamin d3, magnesium, and tramadol, and the following Facility-Administered Medications: lactated ringers and midazolam. Her primarily concern today is the Back Pain  Initial Vital Signs:  Pulse/HCG Rate: 78ECG Heart Rate: 82 Temp: (!) 97.2 F (36.2 C) Resp: 18 BP: (!) 144/79 SpO2: 100 %  BMI: Estimated body mass index is 30.3 kg/m as calculated from the following:   Height as of this encounter: '4\' 11"'$  (1.499 m).   Weight as of this encounter: 150 lb (68 kg).  Risk Assessment: Allergies: Reviewed. She has No Known Allergies.  Allergy Precautions: None required Coagulopathies: Reviewed. None identified.  Blood-thinner therapy: None at this time Active Infection(s): Reviewed. None identified. Charlene [name] is afebrile  Site Confirmation: Charlene [name] was asked to confirm the procedure and laterality before marking the site Procedure checklist: Completed Consent: Before the procedure and under the influence of no sedative(s), amnesic(s), or anxiolytics, the patient was informed of the treatment options, risks and possible complications. To fulfill our ethical and legal obligations, as recommended by the American Medical Association's Code of Ethics, I have informed the patient of my clinical impression; the nature and purpose of the treatment or procedure; the risks, benefits, and possible complications of the intervention; the  alternatives, including doing nothing; the risk(s) and benefit(s) of the alternative treatment(s) or procedure(s); and the risk(s) and benefit(s) of doing nothing. The  patient was provided information about the general risks and possible complications associated with the procedure. These may include, but are not limited to: failure to achieve desired goals, infection, bleeding, organ or nerve damage, allergic reactions, paralysis, and death. In addition, the patient was informed of those risks and complications associated to Spine-related procedures, such as failure to decrease pain; infection (i.e.: Meningitis, epidural or intraspinal abscess); bleeding (i.e.: epidural hematoma, subarachnoid hemorrhage, or any other type of intraspinal or peri-dural bleeding); organ or nerve damage (i.e.: Any type of peripheral nerve, nerve root, or spinal cord injury) with subsequent damage to sensory, motor, and/or autonomic systems, resulting in permanent pain, numbness, and/or weakness of one or several areas of the body; allergic reactions; (i.e.: anaphylactic reaction); and/or death. Furthermore, the patient was informed of those risks and complications associated with the medications. These include, but are not limited to: allergic reactions (i.e.: anaphylactic or anaphylactoid reaction(s)); adrenal axis suppression; blood sugar elevation that in diabetics may result in ketoacidosis or comma; water retention that in patients with history of congestive heart failure may result in shortness of breath, pulmonary edema, and decompensation with resultant heart failure; weight gain; swelling or edema; medication-induced neural toxicity; particulate matter embolism and blood vessel occlusion with resultant organ, and/or nervous system infarction; and/or aseptic necrosis of one or more joints. Finally, the patient was informed that Medicine is not an exact science; therefore, there is also the possibility of unforeseen or  unpredictable risks and/or possible complications that may result in a catastrophic outcome. The patient indicated having understood very clearly. We have given the patient no guarantees and we have made no promises. Enough time was given to the patient to ask questions, all of which were answered to the patient's satisfaction. Ms. Welcome has indicated that she wanted to continue with the procedure. Attestation: I, the ordering provider, attest that I have discussed with the patient the benefits, risks, side-effects, alternatives, likelihood of achieving goals, and potential problems during recovery for the procedure that I have provided informed consent. Date  Time: 10/09/2020 10:48 AM  Pre-Procedure Preparation:  Monitoring: As per clinic protocol. Respiration, ETCO2, SpO2, BP, heart rate and rhythm monitor placed and checked for adequate function Safety Precautions: Patient was assessed for positional comfort and pressure points before starting the procedure. Time-out: I initiated and conducted the "Time-out" before starting the procedure, as per protocol. The patient was asked to participate by confirming the accuracy of the "Time Out" information. Verification of the correct person, site, and procedure were performed and confirmed by me, the nursing staff, and the patient. "Time-out" conducted as per Joint Commission's Universal Protocol (UP.01.01.01). Time: 1117  Description of Procedure:          Laterality: Bilateral. The procedure was performed in identical fashion on both sides. Levels:  L2, L3, L4, L5, & S1 Medial Branch Level(s) Area Prepped: Posterior Lumbosacral Region DuraPrep (Iodine Povacrylex [0.7% available iodine] and Isopropyl Alcohol, 74% w/w) Safety Precautions: Aspiration looking for blood return was conducted prior to all injections. At no point did we inject any substances, as a needle was being advanced. Before injecting, the patient was told to immediately notify me if she  was experiencing any new onset of "ringing in the ears, or metallic taste in the mouth". No attempts were made at seeking any paresthesias. Safe injection practices and needle disposal techniques used. Medications properly checked for expiration dates. SDV (single dose vial) medications used. After the completion of the procedure, all disposable equipment used was  discarded in the proper designated medical waste containers. Local Anesthesia: Protocol guidelines were followed. The patient was positioned over the fluoroscopy table. The area was prepped in the usual manner. The time-out was completed. The target area was identified using fluoroscopy. A 12-in long, straight, sterile hemostat was used with fluoroscopic guidance to locate the targets for each level blocked. Once located, the skin was marked with an approved surgical skin marker. Once all sites were marked, the skin (epidermis, dermis, and hypodermis), as well as deeper tissues (fat, connective tissue and muscle) were infiltrated with a small amount of a short-acting local anesthetic, loaded on a 10cc syringe with a 25G, 1.5-in  Needle. An appropriate amount of time was allowed for local anesthetics to take effect before proceeding to the next step. Local Anesthetic: Lidocaine 2.0% The unused portion of the local anesthetic was discarded in the proper designated containers. Technical explanation of process:  L2 Medial Branch Nerve Block (MBB): The target area for the L2 medial branch is at the junction of the postero-lateral aspect of the superior articular process and the superior, posterior, and medial edge of the transverse process of L3. Under fluoroscopic guidance, a Quincke needle was inserted until contact was made with os over the superior postero-lateral aspect of the pedicular shadow (target area). After negative aspiration for blood, 0.5 mL of the nerve block solution was injected without difficulty or complication. The needle was removed  intact. L3 Medial Branch Nerve Block (MBB): The target area for the L3 medial branch is at the junction of the postero-lateral aspect of the superior articular process and the superior, posterior, and medial edge of the transverse process of L4. Under fluoroscopic guidance, a Quincke needle was inserted until contact was made with os over the superior postero-lateral aspect of the pedicular shadow (target area). After negative aspiration for blood, 0.5 mL of the nerve block solution was injected without difficulty or complication. The needle was removed intact. L4 Medial Branch Nerve Block (MBB): The target area for the L4 medial branch is at the junction of the postero-lateral aspect of the superior articular process and the superior, posterior, and medial edge of the transverse process of L5. Under fluoroscopic guidance, a Quincke needle was inserted until contact was made with os over the superior postero-lateral aspect of the pedicular shadow (target area). After negative aspiration for blood, 0.5 mL of the nerve block solution was injected without difficulty or complication. The needle was removed intact. L5 Medial Branch Nerve Block (MBB): The target area for the L5 medial branch is at the junction of the postero-lateral aspect of the superior articular process and the superior, posterior, and medial edge of the sacral ala. Under fluoroscopic guidance, a Quincke needle was inserted until contact was made with os over the superior postero-lateral aspect of the pedicular shadow (target area). After negative aspiration for blood, 0.5 mL of the nerve block solution was injected without difficulty or complication. The needle was removed intact. S1 Medial Branch Nerve Block (MBB): The target area for the S1 medial branch is at the posterior and inferior 6 o'clock position of the L5-S1 facet joint. Under fluoroscopic guidance, the Quincke needle inserted for the L5 MBB was redirected until contact was made with  os over the inferior and postero aspect of the sacrum, at the 6 o' clock position under the L5-S1 facet joint (Target area). After negative aspiration for blood, 0.5 mL of the nerve block solution was injected without difficulty or complication. The needle was removed  intact.  Nerve block solution: 0.2% PF-Ropivacaine + Triamcinolone (40 mg/mL) diluted to a final concentration of 2 mg of Triamcinolone/mL of Ropivacaine The unused portion of the solution was discarded in the proper designated containers. Procedural Needles: 22-gauge, 3.5-inch, Quincke needles used for all levels.  Once the entire procedure was completed, the treated area was cleaned, making sure to leave some of the prepping solution back to take advantage of its long term bactericidal properties.      Illustration of the posterior view of the lumbar spine and the posterior neural structures. Laminae of L2 through S1 are labeled. DPRL5, dorsal primary ramus of L5; DPRS1, dorsal primary ramus of S1; DPR3, dorsal primary ramus of L3; FJ, facet (zygapophyseal) joint L3-L4; I, inferior articular process of L4; LB1, lateral branch of dorsal primary ramus of L1; IAB, inferior articular branches from L3 medial branch (supplies L4-L5 facet joint); IBP, intermediate branch plexus; MB3, medial branch of dorsal primary ramus of L3; NR3, third lumbar nerve root; S, superior articular process of L5; SAB, superior articular branches from L4 (supplies L4-5 facet joint also); TP3, transverse process of L3.  Vitals:   10/09/20 1116 10/09/20 1121 10/09/20 1126 10/09/20 1128  BP: (!) 170/101 (!) 172/93 (!) 163/96 (!) 154/96  Pulse:      Resp: '13 13 14 15  '$ Temp:      TempSrc:      SpO2: 98% 100% 98% 99%  Weight:      Height:         Start Time: 1117 hrs. End Time: 1126 hrs.  Imaging Guidance (Spinal):          Type of Imaging Technique: Fluoroscopy Guidance (Spinal) Indication(s): Assistance in needle guidance and placement for procedures  requiring needle placement in or near specific anatomical locations not easily accessible without such assistance. Exposure Time: Please see nurses notes. Contrast: None used. Fluoroscopic Guidance: I was personally present during the use of fluoroscopy. "Tunnel Vision Technique" used to obtain the best possible view of the target area. Parallax error corrected before commencing the procedure. "Direction-depth-direction" technique used to introduce the needle under continuous pulsed fluoroscopy. Once target was reached, antero-posterior, oblique, and lateral fluoroscopic projection used confirm needle placement in all planes. Images permanently stored in EMR. Interpretation: No contrast injected. I personally interpreted the imaging intraoperatively. Adequate needle placement confirmed in multiple planes. Permanent images saved into the patient's record.  Antibiotic Prophylaxis:   Anti-infectives (From admission, onward)    None      Indication(s): None identified  Post-operative Assessment:  Post-procedure Vital Signs:  Pulse/HCG Rate: 7880 Temp: (!) 97.2 F (36.2 C) Resp: 15 BP: (!) 154/96 SpO2: 99 %  EBL: None  Complications: No immediate post-treatment complications observed by team, or reported by patient.  Note: The patient tolerated the entire procedure well. A repeat set of vitals were taken after the procedure and the patient was kept under observation following institutional policy, for this type of procedure. Post-procedural neurological assessment was performed, showing return to baseline, prior to discharge. The patient was provided with post-procedure discharge instructions, including a section on how to identify potential problems. Should any problems arise concerning this procedure, the patient was given instructions to immediately contact us, at any time, without hesitation. In any case, we plan to contact the patient by telephone for a follow-up status report regarding  this interventional procedure.  Comments:  No additional relevant information.  Plan of Care  Orders:  Orders Placed This Encounter  Procedures  LUMBAR FACET(MEDIAL BRANCH NERVE BLOCK) MBNB    Scheduling Instructions:     Procedure: Lumbar facet block (AKA.: Lumbosacral medial branch nerve block) (without steroids)     Side: Bilateral     Level: L3-4, L4-5, & L5-S1 Facets (L2, L3, L4, L5, & S1 Medial Branch Nerves)     Sedation: Patient's choice.     Timeframe: Today    Order Specific Question:   Where will this procedure be performed?    Answer:   [facility]   DG [facility] C-ARM 1-60 MIN NO REPORT    Intraoperative interpretation by procedural physician at [facility].    Standing Status:   Standing    Number of Occurrences:   1    Order Specific Question:   Reason for exam:    Answer:   Assistance in needle guidance and placement for procedures requiring needle placement in or near specific anatomical locations not easily accessible without such assistance.   Informed Consent Details: Physician/Practitioner Attestation; Transcribe to consent form and obtain patient signature    Nursing Order: Transcribe to consent form and obtain patient signature. Note: Always confirm laterality of pain with [name], before procedure.    Order Specific Question:   Physician/Practitioner attestation of informed consent for procedure/surgical case    Answer:   I, the physician/practitioner, attest that I have discussed with the patient the benefits, risks, side effects, alternatives, likelihood of achieving goals and potential problems during recovery for the procedure that I have provided informed consent.    Order Specific Question:   Procedure    Answer:   Lumbar Facet Block  under fluoroscopic guidance    Order Specific Question:   Physician/Practitioner performing the procedure    Answer:   Yuritzy Zehring A. Dossie Arbour MD    Order Specific Question:   Indication/Reason     Answer:   Low Back Pain, with our without leg pain, due to Facet Joint Arthralgia (Joint Pain) Spondylosis (Arthritis of the Spine), without myelopathy or radiculopathy (Nerve Damage).   Provide equipment / supplies at bedside    "Block Tray" (Disposable  single use) Needle type: SpinalSpinal Amount/quantity: 4 Size: Regular (3.5-inch) Gauge: 22G    Standing Status:   Standing    Number of Occurrences:   1    Order Specific Question:   Specify    Answer:   Block Tray    Chronic Opioid Analgesic:  No chronic opioid analgesics therapy prescribed by our practice. Tramadol 50 mg, 1 tab PO q 8 hrs (150 mg/day of tramadol) MME/day: 15 mg/day.   Medications ordered for procedure: Meds ordered this encounter  Medications   lidocaine (XYLOCAINE) 2 % (with pres) injection 400 mg   pentafluoroprop-tetrafluoroeth (GEBAUERS) aerosol   lactated ringers infusion 1,000 mL   midazolam (VERSED) 5 MG/5ML injection 0.5-2 mg    Make sure Flumazenil is available in the pyxis when using this medication. If oversedation occurs, administer 0.2 mg IV over 15 sec. If after 45 sec no response, administer 0.2 mg again over 1 min; may repeat at 1 min intervals; not to exceed 4 doses (1 mg)   ropivacaine (PF) 2 mg/mL (0.2%) (NAROPIN) injection 18 mL   triamcinolone acetonide (KENALOG-40) injection 40 mg    Medications administered: We administered lidocaine, pentafluoroprop-tetrafluoroeth, ropivacaine (PF) 2 mg/mL (0.2%), and triamcinolone acetonide.  See the medical record for exact dosing, route, and time of administration.  Follow-up plan:   Return in about 2 weeks (around 10/23/2020) for Proc-day (  T,Th), (VV), (PPE).       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 + steroids)  Diagnostic bilateral genicular NB  Possible bilateral genicular nerve RFA    Palliative PRN treatment(s):   Palliative left L5 TFESI #4 + left L4-5 interlaminar LESI #4  Palliative left L5 TFESI #4 Palliative left L4-5 interlaminar LESI #4  Palliative left L5-S1 LESI #3  Palliative bilateral Hyalgan knee series S2N1 (last done on 02/25/2018)     Recent Visits Date Type Provider Dept  09/03/20 Office Visit Milinda Pointer, MD [facility]  Showing recent visits within past 90 days and meeting all other requirements Today's Visits Date Type Provider Dept  10/09/20 Procedure visit Milinda Pointer, MD [facility]  Showing today's visits and meeting all other requirements Future Appointments Date Type Provider Dept  10/25/20 Appointment Milinda Pointer, MD [facility]  Showing future appointments within next 90 days and meeting all other requirements Disposition: Discharge home  Discharge (Date  Time): 10/09/2020; 1133 hrs.   Primary Care Physician: Tracie Harrier, MD Location: [facility] Note by: Gaspar Cola, MD Date: 10/09/2020; Time: 12:11 PM  Disclaimer:  Medicine is not an Chief Strategy Officer. The only guarantee in medicine is that nothing is guaranteed. It is important to note that the decision to proceed with this intervention was based on the information collected from the patient. The Data and conclusions were drawn from the patient's questionnaire, the interview, and the physical examination. Because the information was provided in large part by the patient, it cannot be guaranteed that it has not been purposely or unconsciously manipulated. Every effort has been made to obtain as much relevant data as possible for this evaluation. It is important to note that the conclusions that lead to this procedure are derived in large part from the available data. Always take  into account that the treatment will also be dependent on availability of resources and existing treatment guidelines, considered by other Pain Management Practitioners as being common knowledge and practice, at the time of the intervention. For Medico-Legal purposes, it is also important to point out that variation in procedural techniques and pharmacological choices are the acceptable norm. The indications, contraindications, technique, and results of the above procedure should only be interpreted and judged by a Board-Certified Interventional Pain Specialist with extensive familiarity and expertise in the same exact procedure and technique.

## 2020-10-09 ENCOUNTER — Ambulatory Visit
Admission: RE | Admit: 2020-10-09 | Discharge: 2020-10-09 | Disposition: A | Discharge status: Home / Self Care | Payer: Medicare Other | Source: Ambulatory Visit | Attending: Pain Medicine | Admitting: Pain Medicine

## 2020-10-09 ENCOUNTER — Ambulatory Visit (HOSPITAL_BASED_OUTPATIENT_CLINIC_OR_DEPARTMENT_OTHER): Payer: Medicare Other | Admitting: Pain Medicine

## 2020-10-09 ENCOUNTER — Encounter: Payer: Self-pay | Admitting: Pain Medicine

## 2020-10-09 ENCOUNTER — Other Ambulatory Visit: Payer: Self-pay

## 2020-10-09 VITALS — BP 154/96 | HR 78 | Temp 97.2°F | Resp 15 | Ht 59.0 in | Wt 150.0 lb

## 2020-10-09 DIAGNOSIS — E1165 Type 2 diabetes mellitus with hyperglycemia: Secondary | ICD-10-CM | POA: Diagnosis present

## 2020-10-09 DIAGNOSIS — M5442 Lumbago with sciatica, left side: Secondary | ICD-10-CM | POA: Insufficient documentation

## 2020-10-09 DIAGNOSIS — M47817 Spondylosis without myelopathy or radiculopathy, lumbosacral region: Secondary | ICD-10-CM | POA: Insufficient documentation

## 2020-10-09 DIAGNOSIS — M47816 Spondylosis without myelopathy or radiculopathy, lumbar region: Secondary | ICD-10-CM | POA: Diagnosis present

## 2020-10-09 DIAGNOSIS — G8929 Other chronic pain: Secondary | ICD-10-CM | POA: Insufficient documentation

## 2020-10-09 DIAGNOSIS — M545 Low back pain, unspecified: Secondary | ICD-10-CM | POA: Insufficient documentation

## 2020-10-09 DIAGNOSIS — M4316 Spondylolisthesis, lumbar region: Secondary | ICD-10-CM

## 2020-10-09 DIAGNOSIS — M5136 Other intervertebral disc degeneration, lumbar region: Secondary | ICD-10-CM | POA: Diagnosis present

## 2020-10-09 DIAGNOSIS — M51369 Other intervertebral disc degeneration, lumbar region without mention of lumbar back pain or lower extremity pain: Secondary | ICD-10-CM

## 2020-10-09 MED ORDER — PENTAFLUOROPROP-TETRAFLUOROETH EX AERO
INHALATION_SPRAY | Freq: Once | CUTANEOUS | Status: AC
  Administered 2020-10-09: 1 via TOPICAL
  Filled 2020-10-09: qty 116

## 2020-10-09 MED ORDER — LIDOCAINE HCL (PF) 2 % IJ SOLN
INTRAMUSCULAR | Status: AC
  Filled 2020-10-09: qty 20

## 2020-10-09 MED ORDER — TRIAMCINOLONE ACETONIDE 40 MG/ML IJ SUSP
INTRAMUSCULAR | Status: AC
  Filled 2020-10-09: qty 1

## 2020-10-09 MED ORDER — TRIAMCINOLONE ACETONIDE 40 MG/ML IJ SUSP
40.0000 mg | Freq: Once | INTRAMUSCULAR | Status: AC
  Administered 2020-10-09: 40 mg

## 2020-10-09 MED ORDER — ROPIVACAINE HCL 2 MG/ML IJ SOLN
INTRAMUSCULAR | Status: AC
  Filled 2020-10-09: qty 20

## 2020-10-09 MED ORDER — LACTATED RINGERS IV SOLN: 1000.0000 mL | Freq: Once | INTRAVENOUS | Status: DC

## 2020-10-09 MED ORDER — LIDOCAINE HCL 2 % IJ SOLN
20.0000 mL | Freq: Once | INTRAMUSCULAR | Status: AC
  Administered 2020-10-09: 400 mg
  Filled 2020-10-09: qty 20

## 2020-10-09 MED ORDER — MIDAZOLAM HCL 5 MG/5ML IJ SOLN: 0.5000 mg | Freq: Once | INTRAMUSCULAR | Status: DC

## 2020-10-09 MED ORDER — ROPIVACAINE HCL 2 MG/ML IJ SOLN
18.0000 mL | Freq: Once | INTRAMUSCULAR | Status: AC
  Administered 2020-10-09: 18 mL via PERINEURAL

## 2020-10-09 NOTE — Patient Instructions (Signed)

## 2020-10-09 NOTE — Progress Notes (Signed)
Safety precautions to be maintained throughout the outpatient stay will include: orient to surroundings, keep bed in low position, maintain call bell within reach at all times, provide assistance with transfer out of bed and ambulation.  

## 2020-10-10 ENCOUNTER — Telehealth: Payer: Self-pay

## 2020-10-10 NOTE — Telephone Encounter (Signed)
Post procedure phone call.  Patient states she is doing well.  

## 2020-10-24 NOTE — Progress Notes (Signed)
Unsuccessful attempt to contact patient for Virtual Visit (Pain Management Telehealth)   Patient provided contact information:  [phone] (home); There is no such number on file (mobile).; (Preferred) [phone] [email or web address]   Pre-screening:  Our staff was successful in contacting Ms. Enyeart using the above provided information.   I unsuccessfully attempted to make contact with Georga Kaufmann twice on 10/25/2020 via telephone. I was unable to complete the virtual encounter due to call going directly to voicemail. I was able to leave a message.  The information below was obtained by the nursing staff during the previsit assessment.  Post-Procedure Evaluation  Procedure (10/09/2020):  Procedure:           Anesthesia, Analgesia, Anxiolysis:  Type: Lumbar Facet, Medial Branch Block(s) #1 Primary Purpose: Diagnostic Region: Posterolateral Lumbosacral Spine Level: L2, L3, L4, L5, & S1 Medial Branch Level(s). Injecting these levels blocks the L3-4, L4-5, and L5-S1 lumbar facet joints. Laterality: Bilateral   Type: Local Anesthesia Local Anesthetic: Lidocaine 1-2% Sedation: Minimal Anxiolysis  Indication(s): Anxiety & Analgesia Route: Infiltration ([place]/IM) IV Access: Available     Position: Prone    Indications: 1. Lumbar facet syndrome (Bilateral)   2. Lumbar facet hypertrophy   3. Lumbar facet arthropathy   4. Arthropathy of lumbar facet joint   5. Spondylosis without myelopathy or radiculopathy, lumbosacral region   6. DDD (degenerative disc disease), lumbar   7. Currently low back pain (Bilateral) w/o sciatica   8. Uncontrolled type 2 diabetes mellitus with hyperglycemia (HCC)     Pain Score: Pre-procedure: 1 /10 Post-procedure: 0-No pain/10   Anxiolysis: Please see nurses note.  Effectiveness during initial hour after procedure (Ultra-Short Term Relief): 90 %.  Local anesthetic used: Long-acting (4-6 hours) Effectiveness: Defined as any analgesic benefit  obtained secondary to the administration of local anesthetics. This carries significant diagnostic value as to the etiological location, or anatomical origin, of the pain. Duration of benefit is expected to coincide with the duration of the local anesthetic used.  Effectiveness during initial 4-6 hours after procedure (Short-Term Relief): 90 %.  Long-term benefit: Defined as any relief past the pharmacologic duration of the local anesthetics.  Effectiveness past the initial 6 hours after procedure (Long-Term Relief): 90 % (pain relief last approx 2 days.  pain is improved when standing.).  Pharmacotherapy Assessment  Analgesic: No chronic opioid analgesics therapy prescribed by our practice. Tramadol 50 mg, 1 tab PO q 8 hrs (150 mg/day of tramadol) MME/day: 15 mg/day.   Follow-up plan:   Reschedule Visit.    Interventional management options:  Considering:   NOTE: UNCONTROLLED IDDM (NO STEROIDS)  Diagnostic left LSB  Possible left-sided lumbar sympathetic RFA  Diagnostic right L5 TFESI  Diagnostic bilateral lumbar facet block  Possible bilateral lumbar facet RFA  Diagnostic bilateral IA knee joint injection (w/ L.A. + steroids)  Diagnostic bilateral genicular NB  Possible bilateral genicular nerve RFA    Palliative PRN treatment(s):   Palliative left L5 TFESI #4 + left L4-5 interlaminar LESI #4  Palliative left L5 TFESI #4 Palliative left L4-5 interlaminar LESI #4  Palliative left L5-S1 LESI #3  Palliative bilateral Hyalgan knee series S2N1 (last done on 02/25/2018)      Recent Visits Date Type Provider Dept  10/09/20 Procedure visit Milinda Pointer, MD [facility]  09/03/20 Office Visit Milinda Pointer, MD [facility]  Showing recent visits within past 90 days and meeting all other requirements Today's Visits Date Type Provider Dept  10/25/20 Office Visit Milinda Pointer,  MD [facility]  Showing today's visits and meeting all other  requirements Future Appointments No visits were found meeting these conditions. Showing future appointments within next 90 days and meeting all other requirements   Note by: Gaspar Cola, MD Date: 10/25/2020; Time: 2:50 PM

## 2020-10-25 ENCOUNTER — Other Ambulatory Visit: Payer: Self-pay

## 2020-10-25 ENCOUNTER — Ambulatory Visit: Payer: Medicare Other | Attending: Pain Medicine | Admitting: Pain Medicine

## 2020-10-25 ENCOUNTER — Telehealth: Payer: Self-pay | Admitting: *Deleted

## 2020-10-25 DIAGNOSIS — M545 Low back pain, unspecified: Secondary | ICD-10-CM

## 2020-10-25 DIAGNOSIS — M4316 Spondylolisthesis, lumbar region: Secondary | ICD-10-CM

## 2020-10-25 DIAGNOSIS — M5136 Other intervertebral disc degeneration, lumbar region: Secondary | ICD-10-CM

## 2020-10-25 DIAGNOSIS — M47816 Spondylosis without myelopathy or radiculopathy, lumbar region: Secondary | ICD-10-CM

## 2020-10-25 DIAGNOSIS — G8929 Other chronic pain: Secondary | ICD-10-CM

## 2020-10-25 NOTE — Telephone Encounter (Signed)
0806 Attempted to call patient for pre visit assessment. No answer. LVM.

## 2020-11-05 ENCOUNTER — Other Ambulatory Visit: Payer: Self-pay | Admitting: Internal Medicine

## 2020-11-05 ENCOUNTER — Telehealth: Payer: Medicare Other | Admitting: Pain Medicine

## 2020-11-05 DIAGNOSIS — Z1231 Encounter for screening mammogram for malignant neoplasm of breast: Secondary | ICD-10-CM

## 2020-11-13 ENCOUNTER — Telehealth: Payer: Medicare Other | Admitting: Pain Medicine

## 2020-12-05 ENCOUNTER — Telehealth: Payer: Self-pay | Admitting: *Deleted

## 2020-12-05 NOTE — Progress Notes (Signed)
[name]: Charlene [name]  Service Category: E/M  Provider: Gaspar Cola, MD  DOB: 1960-03-31  DOS: 12/06/2020  Location: Office  MRN: [id number]  Setting: Ambulatory outpatient  Referring Provider: Tracie Harrier, MD  Type: Established [name]  Specialty: Interventional Pain Management  PCP: Tracie Harrier, MD  Location: Remote location  Delivery: TeleHealth     Virtual Encounter - Pain Management PROVIDER NOTE: Information contained herein reflects review and annotations entered in association with encounter. Interpretation of such information and data should be left to medically-trained personnel. Information provided to [name] can be located elsewhere in Charlene medical record under "[name] Instructions". Document created using STT-dictation technology, any transcriptional errors that may result from process are unintentional.    Contact & Pharmacy Preferred: [phone] Home: [phone] (home) Mobile: [phone] (mobile) E-mail: [email or web address]  [place], [name] - [address] [name] [address] Phone: [phone] Fax: [phone]   Pre-screening  Charlene [name] offered "in-person" vs "virtual" encounter. She indicated preferring virtual for this encounter.   Reason COVID-19*  Social distancing based on CDC and AMA recommendations.   I contacted Charlene [name] on 12/06/2020 via telephone.      I clearly identified myself as Gaspar Cola, MD. I verified that I was speaking with Charlene correct person using two identifiers (Name: Charlene [name], and date of birth: [birth year]).  Consent I sought verbal advanced consent from Charlene [name] for virtual visit interactions. I informed Charlene [name] of possible security and privacy concerns, risks, and limitations associated with providing "not-in-person" medical evaluation and management services. I also informed Charlene [name] of Charlene availability of "in-person"  appointments. Finally, I informed her that there would be a charge for Charlene virtual visit and that she could be  personally, fully or partially, financially responsible for it. Charlene [name] expressed understanding and agreed to proceed.   Historic Elements   Charlene [name] is a 60 y.o. year old, female [name] evaluated today after our last contact on 10/25/2020. Charlene [name]  has a past medical history of Arthritis, Degenerative disc disease, lumbar, Diabetes mellitus without complication ([place]), GERD (gastroesophageal reflux disease), Hypertension, Neuropathy, Polyneuropathy, and Polyneuropathy. She also  has a past surgical history that includes arthroscopic rotator cuff; Colonoscopy; Abdominal hysterectomy; endosopic sinus; and Cataract extraction w/PHACO (Right, 11/25/2017). Charlene [name] has a current medication list which includes Charlene following prescription(s): alendronate, carvedilol, dicyclomine, duloxetine, hydralazine, toujeo max solostar, insulin lispro, lisinopril, metformin, pantoprazole, polyethylene glycol, pravastatin, pregabalin, sucralfate, tramadol, amlodipine, baclofen, vitamin d3, and magnesium. She  reports that she has never smoked. She has never used smokeless tobacco. She reports that she does not currently use alcohol. She reports that she does not use drugs. Charlene [name] has No Known Allergies.   HPI  Today, she is being contacted for both, medication management and a post-procedure assessment.  Charlene [name] indicates having attained 100% relief of Charlene pain for Charlene duration of Charlene local anesthetic on both sides of Charlene lower back however, after 2 days, Charlene pain on Charlene right side of Charlene lower back started to come back.  When reviewing Charlene [name]'s x-rays we can easily see that Charlene facet joint arthropathy is much worse on Charlene right side when compared to Charlene left.  Today I talked to Charlene [name] about some alternatives and we have decided to proceed with Charlene second diagnostic lumbar facet block  on Charlene right side.  If she again gets good relief of Charlene pain,  at least for Charlene duration of Charlene local anesthetic, then we will proceed with radiofrequency ablation.  Because Charlene [name] has uncontrolled IDDM, we will not be adding steroids to Charlene second diagnostic injection to avoid increasing her blood sugar.   Charlene [name] indicates doing well with Charlene current medication regimen. No adverse reactions or side effects reported to Charlene medications.  Today I will send a prescription for another 30 days on her tramadol.  When she returns for her second diagnostic injection, we will also have a conversation regarding her recent UDS (+) for cocaine.  Post-Procedure Evaluation  Procedure (10/09/2020):  Type: Lumbar Facet, Medial Branch Block(s) #1 Primary Purpose: Diagnostic Region: Posterolateral Lumbosacral Spine Level: L2, L3, L4, L5, & S1 Medial Branch Level(s). Injecting these levels blocks Charlene L3-4, L4-5, and L5-S1 lumbar facet joints. Laterality: Bilateral   Type: Local Anesthesia Local Anesthetic: Lidocaine 1-2% Sedation: Minimal Anxiolysis  Indication(s): Anxiety & Analgesia Route: Infiltration ([place]/IM) IV Access: Available     Position: Prone    Indications: 1. Lumbar facet syndrome (Bilateral)   2. Lumbar facet hypertrophy   3. Lumbar facet arthropathy   4. Arthropathy of lumbar facet joint   5. Spondylosis without myelopathy or radiculopathy, lumbosacral region   6. DDD (degenerative disc disease), lumbar   7. Currently low back pain (Bilateral) w/o sciatica   8. Uncontrolled type 2 diabetes mellitus with hyperglycemia (HCC)     Pain Score: Pre-procedure: 1 /10 Post-procedure: 0-No pain/10    Anxiolysis: Please see nurses note.  Effectiveness during initial hour after procedure (Ultra-Short Term Relief): 100 %.   Local anesthetic used: Long-acting (4-6 hours) Effectiveness: Defined as any analgesic benefit obtained secondary to Charlene administration of local anesthetics. This  carries significant diagnostic value as to Charlene etiological location, or anatomical origin, of Charlene pain. Duration of benefit is expected to coincide with Charlene duration of Charlene local anesthetic used.  Effectiveness during initial 4-6 hours after procedure (Short-Term Relief): 100 %.   Long-term benefit: Defined as any relief past Charlene pharmacologic duration of Charlene local anesthetics.  Effectiveness past Charlene initial 6 hours after procedure (Long-Term Relief): 90 % (pain relief last approx 2 days.  pain is improved when standing.).   Benefits, current: Defined as benefit present at Charlene time of this evaluation.   Analgesia: Currently Charlene [name] is experiencing 100% relief of Charlene pain in Charlene left lower back and only Charlene pain on Charlene right lower back has began to come back. Function: Somewhat improved ROM: Somewhat improved  Pharmacotherapy Assessment   Analgesic: No chronic opioid analgesics therapy prescribed by our practice. ABNORMAL UDS (09/03/2020) (+) for COCAINE.   Monitoring: [place] PMP: PDMP reviewed during this encounter.       Pharmacotherapy: No side-effects or adverse reactions reported. Compliance: No problems identified. Effectiveness: Clinically acceptable. Plan: Refer to "POC". UDS:  Summary  Date Value Ref Range Status  09/03/2020 Note  Final    Comment:    ==================================================================== Compliance Drug Analysis, Ur ==================================================================== Test                             Result       Flag       Units  Drug Present and Declared for Prescription Verification   Pregabalin                     PRESENT      EXPECTED   Baclofen  PRESENT      EXPECTED   Duloxetine                     PRESENT      EXPECTED  Drug Present not Declared for Prescription Verification   Benzoylecgonine                38           UNEXPECTED ng/mg creat    Benzoylecgonine is a metabolite of cocaine; its  presence indicates    use of this drug.  Source is most commonly illicit, but cocaine is    present in some topical anesthetic solutions.    Nortriptyline                  PRESENT      UNEXPECTED    Nortriptyline may be administered as a prescription drug; it is also    an expected metabolite of amitriptyline.  Drug Absent but Declared for Prescription Verification   Diazepam                       Not Detected UNEXPECTED ng/mg creat   Tramadol                       Not Detected UNEXPECTED ng/mg creat   Lidocaine                      Not Detected UNEXPECTED    Lidocaine, as indicated in Charlene declared medication list, is not    always detected even when used as directed.  ==================================================================== Test                      Result    Flag   Units      Ref Range   Creatinine              145              mg/dL      >=20 ==================================================================== Declared Medications:  Charlene flagging and interpretation on this report are based on Charlene  following declared medications.  Unexpected results may arise from  inaccuracies in Charlene declared medications.   **Note: Charlene testing scope of this panel includes these medications:   Baclofen (Lioresal)  Diazepam (Valium)  Duloxetine (Cymbalta)  Pregabalin (Lyrica)  Tramadol (Ultram)   **Note: Charlene testing scope of this panel does not include small to  moderate amounts of these reported medications:   Topical Lidocaine   **Note: Charlene testing scope of this panel does not include Charlene  following reported medications:   Alendronate (Fosamax)  Amlodipine (Norvasc)  Carvedilol (Coreg)  Dapagliflozin (Xigduo)  Dicyclomine (Bentyl)  Hydralazine (Apresoline)  Insulin (Toujeo)  Lisinopril (Zestril)  Magnesium  Menthol  Metformin (Xigduo)  Metformin (Glucophage)  Pantoprazole (Protonix)  Polyethylene Glycol (MiraLAX)  Sucralfate (Carafate)  Vitamin  D3 ==================================================================== For clinical consultation, please call [phone]. ====================================================================      Laboratory Chemistry Profile   Renal Lab Results  Component Value Date   BUN 13 12/23/2018   CREATININE 0.59 12/23/2018   BCR 17 10/12/2017   GFRAA >60 12/23/2018   GFRNONAA >60 12/23/2018    Hepatic Lab Results  Component Value Date   AST 17 10/21/2018   ALT 21 10/21/2018   ALBUMIN 3.6 10/21/2018   ALKPHOS 81 10/21/2018   LIPASE 20 10/21/2018    Electrolytes Lab  Results  Component Value Date   NA 137 12/23/2018   K 4.1 12/23/2018   CL 100 12/23/2018   CALCIUM 9.8 12/23/2018   MG 2.0 10/12/2017    Bone Lab Results  Component Value Date   25OHVITD1 17 (L) 10/12/2017   25OHVITD2 <1.0 10/12/2017   25OHVITD3 17 10/12/2017    Inflammation (CRP: Acute Phase) (ESR: Chronic Phase) Lab Results  Component Value Date   CRP 4 10/12/2017   ESRSEDRATE 12 10/12/2017         Note: Above Lab results reviewed.  Imaging  DG PAIN CLINIC C-ARM 1-60 MIN NO REPORT Fluoro was used, but no Radiologist interpretation will be provided.  Please refer to "NOTES" tab for provider progress note.  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  Plan of Care  Problem-specific:  No problem-specific Assessment & Plan notes found for this encounter.  Ms. KYNNEDI ZWEIG has a current medication list which includes Charlene following long-term medication(s): carvedilol, dicyclomine, duloxetine, hydralazine, toujeo max solostar, insulin lispro, lisinopril, pantoprazole, pravastatin, pregabalin, sucralfate, tramadol, amlodipine, baclofen, vitamin d3, and magnesium.  Pharmacotherapy (Medications Ordered): Meds ordered this encounter  Medications   traMADol (ULTRAM) 50 MG tablet    Sig: Take 1 tablet (50 mg total) by mouth every 8 (eight) hours as needed for severe pain.    Dispense:  90 tablet    Refill:  0    Not a duplicate. Do NOT delete! Dispense 1 day early if closed on fill date. Warn not to take CNS-depressants 8 hours before or after taking opioid. Do not send refill request. Renewal requires appointment.    Orders:  Orders Placed This Encounter  Procedures   LUMBAR FACET(MEDIAL BRANCH NERVE BLOCK) MBNB    Standing Status:   Future    Standing Expiration Date:   03/08/2021    Scheduling Instructions:     Procedure: Lumbar facet block (AKA.: Lumbosacral medial branch nerve block)     Side: Right-sided     Level: L3-4, L4-5, & L5-S1 Facets (L2, L3, L4, L5, & S1 Medial Branch Nerves)     Sedation: [name]'s choice.     Timeframe: ASAA    Order Specific Question:   Where will this procedure be performed?    Answer:   [facility]    Follow-up plan:   Return for (Clinic) procedure: (R) L-FCT Blk #2 (NO STEROIDS) (Sed-anx).     Interventional management options:  Considering:   NOTE: UNCONTROLLED IDDM (NO STEROIDS)  Diagnostic left LSB  Possible left-sided lumbar sympathetic RFA  Diagnostic right L5 TFESI  Diagnostic bilateral lumbar facet block  Possible bilateral lumbar facet RFA  Diagnostic bilateral IA knee joint injection (w/ L.A. + steroids)  Diagnostic bilateral genicular NB  Possible bilateral  genicular nerve RFA    Palliative PRN treatment(s):   Palliative left L5 TFESI #4 + left L4-5 interlaminar LESI #4  Palliative left L5 TFESI #4  Palliative left L4-5 interlaminar LESI #4  Palliative left L5-S1 LESI #3  Palliative bilateral Hyalgan knee series S2N1 (last done on 02/25/2018)       Recent Visits Date Type Provider Dept  10/25/20 Office Visit Milinda Pointer, MD [facility]  10/09/20 Procedure visit Milinda Pointer, MD [facility]  Showing recent visits within past 90 days and meeting all other requirements Today's Visits Date Type Provider Dept  12/06/20 Office Visit Milinda Pointer, MD [facility]  Showing today's visits and meeting all other requirements Future Appointments No visits were found meeting these conditions. Showing future appointments within next 90 days and meeting all other requirements I discussed Charlene assessment and treatment plan with Charlene [name]. Charlene [name] was provided an opportunity to ask questions and all were answered. Charlene [name] agreed with Charlene plan and demonstrated an understanding of Charlene instructions.  [name] advised to call back or seek an in-person evaluation if Charlene symptoms or condition worsens.  Duration of encounter: 24 minutes.  Note by: Gaspar Cola, MD Date: 12/06/2020; Time: 5:38 PM

## 2020-12-05 NOTE — Telephone Encounter (Signed)
Attempted to call for pre appointment review of allergies/meds. Unable to leave a message, mailbox full.

## 2020-12-06 ENCOUNTER — Encounter: Payer: Self-pay | Admitting: Pain Medicine

## 2020-12-06 ENCOUNTER — Other Ambulatory Visit: Payer: Self-pay

## 2020-12-06 ENCOUNTER — Ambulatory Visit: Payer: Medicare Other | Attending: Pain Medicine | Admitting: Pain Medicine

## 2020-12-06 DIAGNOSIS — M4316 Spondylolisthesis, lumbar region: Secondary | ICD-10-CM

## 2020-12-06 DIAGNOSIS — M79605 Pain in left leg: Secondary | ICD-10-CM

## 2020-12-06 DIAGNOSIS — M25561 Pain in right knee: Secondary | ICD-10-CM

## 2020-12-06 DIAGNOSIS — M25572 Pain in left ankle and joints of left foot: Secondary | ICD-10-CM

## 2020-12-06 DIAGNOSIS — Z79891 Long term (current) use of opiate analgesic: Secondary | ICD-10-CM

## 2020-12-06 DIAGNOSIS — F1991 Other psychoactive substance use, unspecified, in remission: Secondary | ICD-10-CM

## 2020-12-06 DIAGNOSIS — M5442 Lumbago with sciatica, left side: Secondary | ICD-10-CM | POA: Diagnosis not present

## 2020-12-06 DIAGNOSIS — F1491 Cocaine use, unspecified, in remission: Secondary | ICD-10-CM

## 2020-12-06 DIAGNOSIS — M47816 Spondylosis without myelopathy or radiculopathy, lumbar region: Secondary | ICD-10-CM | POA: Diagnosis not present

## 2020-12-06 DIAGNOSIS — E1142 Type 2 diabetes mellitus with diabetic polyneuropathy: Secondary | ICD-10-CM

## 2020-12-06 DIAGNOSIS — G894 Chronic pain syndrome: Secondary | ICD-10-CM

## 2020-12-06 DIAGNOSIS — Z79899 Other long term (current) drug therapy: Secondary | ICD-10-CM

## 2020-12-06 DIAGNOSIS — R892 Abnormal level of other drugs, medicaments and biological substances in specimens from other organs, systems and tissues: Secondary | ICD-10-CM | POA: Insufficient documentation

## 2020-12-06 DIAGNOSIS — M79604 Pain in right leg: Secondary | ICD-10-CM

## 2020-12-06 DIAGNOSIS — G8929 Other chronic pain: Secondary | ICD-10-CM

## 2020-12-06 DIAGNOSIS — Z9114 Patient's other noncompliance with medication regimen: Secondary | ICD-10-CM

## 2020-12-06 DIAGNOSIS — F1990 Other psychoactive substance use, unspecified, uncomplicated: Secondary | ICD-10-CM

## 2020-12-06 DIAGNOSIS — M25562 Pain in left knee: Secondary | ICD-10-CM

## 2020-12-06 MED ORDER — TRAMADOL HCL 50 MG PO TABS: 50.0000 mg | ORAL_TABLET | Freq: Three times a day (TID) | ORAL | 0 refills | Status: DC | PRN

## 2020-12-07 NOTE — Patient Instructions (Signed)
______________________________________________________________________  Preparing for Procedure with Sedation  NOTICE: Due to recent regulatory changes, starting on August 20, 2020, procedures requiring intravenous (IV) sedation will no longer be performed at the [facility].  These types of procedures are required to be performed at [facility] ambulatory surgery facility.  We are very sorry for the inconvenience.  Procedure appointments are limited to planned procedures: No Prescription Refills. No disability issues will be discussed. No medication changes will be discussed.  Instructions: Oral Intake: Do not eat or drink anything for at least 8 hours prior to your procedure. (Exception: Blood Pressure Medication. See below.) Transportation: A driver is required. You may not drive yourself after the procedure. Blood Pressure Medicine: Do not forget to take your blood pressure medicine with a sip of water the morning of the procedure. If your Diastolic (lower reading) is above 100 mmHg, elective cases will be cancelled/rescheduled. Blood thinners: These will need to be stopped for procedures. Notify our staff if you are taking any blood thinners. Depending on which one you take, there will be specific instructions on how and when to stop it. Diabetics on insulin: Notify the staff so that you can be scheduled 1st case in the morning. If your diabetes requires high dose insulin, take only  of your normal insulin dose the morning of the procedure and notify the staff that you have done so. Preventing infections: Shower with an antibacterial soap the morning of your procedure. Build-up your immune system: Take 1000 mg of Vitamin C with every meal (3 times a day) the day prior to your procedure. Antibiotics: Inform the staff if you have a condition or reason that requires you to take antibiotics before dental procedures. Pregnancy: If you are pregnant, call and cancel the procedure. Sickness: If  you have a cold, fever, or any active infections, call and cancel the procedure. Arrival: You must be in the facility at least 30 minutes prior to your scheduled procedure. Children: Do not bring children with you. Dress appropriately: Bring dark clothing that you would not mind if they get stained. Valuables: Do not bring any jewelry or valuables.  Reasons to call and reschedule or cancel your procedure: (Following these recommendations will minimize the risk of a serious complication.) Surgeries: Avoid having procedures within 2 weeks of any surgery. (Avoid for 2 weeks before or after any surgery). Flu Shots: Avoid having procedures within 2 weeks of a flu shots. (Avoid for 2 weeks before or after immunizations). Barium: Avoid having a procedure within 7-10 days after having had a radiological study involving the use of radiological contrast. (Myelograms, Barium swallow or enema study). Heart attacks: Avoid any elective procedures or surgeries for the initial 6 months after a "Myocardial Infarction" (Heart Attack). Blood thinners: It is imperative that you stop these medications before procedures. Let us know if you if you take any blood thinner.  Infection: Avoid procedures during or within two weeks of an infection (including chest colds or gastrointestinal problems). Symptoms associated with infections include: Localized redness, fever, chills, night sweats or profuse sweating, burning sensation when voiding, cough, congestion, stuffiness, runny nose, sore throat, diarrhea, nausea, vomiting, cold or Flu symptoms, recent or current infections. It is specially important if the infection is over the area that we intend to treat. Heart and lung problems: Symptoms that may suggest an active cardiopulmonary problem include: cough, chest pain, breathing difficulties or shortness of breath, dizziness, ankle swelling, uncontrolled high or unusually low blood pressure, and/or palpitations. If you are    experiencing any of these symptoms, cancel your procedure and contact your primary care physician for an evaluation.  Remember:  Regular Business hours are:  Monday to Thursday 8:00 AM to 4:00 PM  Provider's Schedule: Francisco Naveira, MD:  Procedure days: Tuesday and Thursday 7:30 AM to 4:00 PM  Bilal Lateef, MD:  Procedure days: Monday and Wednesday 7:30 AM to 4:00 PM ______________________________________________________________________   

## 2020-12-17 DIAGNOSIS — E11319 Type 2 diabetes mellitus with unspecified diabetic retinopathy without macular edema: Secondary | ICD-10-CM | POA: Insufficient documentation

## 2020-12-17 DIAGNOSIS — E113299 Type 2 diabetes mellitus with mild nonproliferative diabetic retinopathy without macular edema, unspecified eye: Secondary | ICD-10-CM | POA: Insufficient documentation

## 2020-12-17 DIAGNOSIS — H35033 Hypertensive retinopathy, bilateral: Secondary | ICD-10-CM | POA: Insufficient documentation

## 2020-12-17 DIAGNOSIS — Z961 Presence of intraocular lens: Secondary | ICD-10-CM | POA: Insufficient documentation

## 2020-12-17 DIAGNOSIS — E079 Disorder of thyroid, unspecified: Secondary | ICD-10-CM | POA: Insufficient documentation

## 2020-12-25 ENCOUNTER — Ambulatory Visit: Payer: Medicare Other | Admitting: Pain Medicine

## 2020-12-27 ENCOUNTER — Other Ambulatory Visit: Payer: Self-pay

## 2020-12-27 ENCOUNTER — Ambulatory Visit
Admission: RE | Admit: 2020-12-27 | Discharge: 2020-12-27 | Disposition: A | Discharge status: Home / Self Care | Payer: Medicare Other | Source: Ambulatory Visit | Attending: Pain Medicine | Admitting: Pain Medicine

## 2020-12-27 ENCOUNTER — Encounter: Payer: Self-pay | Admitting: Pain Medicine

## 2020-12-27 ENCOUNTER — Ambulatory Visit (HOSPITAL_BASED_OUTPATIENT_CLINIC_OR_DEPARTMENT_OTHER): Payer: Medicare Other | Admitting: Pain Medicine

## 2020-12-27 VITALS — BP 158/81 | HR 81 | Temp 97.7°F | Resp 16 | Ht 59.0 in | Wt 158.0 lb

## 2020-12-27 DIAGNOSIS — M47816 Spondylosis without myelopathy or radiculopathy, lumbar region: Secondary | ICD-10-CM | POA: Diagnosis not present

## 2020-12-27 DIAGNOSIS — M5136 Other intervertebral disc degeneration, lumbar region: Secondary | ICD-10-CM | POA: Insufficient documentation

## 2020-12-27 DIAGNOSIS — M47817 Spondylosis without myelopathy or radiculopathy, lumbosacral region: Secondary | ICD-10-CM | POA: Diagnosis present

## 2020-12-27 DIAGNOSIS — M4316 Spondylolisthesis, lumbar region: Secondary | ICD-10-CM

## 2020-12-27 DIAGNOSIS — G8929 Other chronic pain: Secondary | ICD-10-CM | POA: Diagnosis present

## 2020-12-27 DIAGNOSIS — M545 Low back pain, unspecified: Secondary | ICD-10-CM | POA: Insufficient documentation

## 2020-12-27 DIAGNOSIS — M5442 Lumbago with sciatica, left side: Secondary | ICD-10-CM | POA: Insufficient documentation

## 2020-12-27 MED ORDER — LIDOCAINE HCL 2 % IJ SOLN
20.0000 mL | Freq: Once | INTRAMUSCULAR | Status: AC
Start: 2020-12-27 — End: 2020-12-27
  Administered 2020-12-27: 400 mg

## 2020-12-27 MED ORDER — ROPIVACAINE HCL 2 MG/ML IJ SOLN
INTRAMUSCULAR | Status: AC
  Filled 2020-12-27: qty 20

## 2020-12-27 MED ORDER — PENTAFLUOROPROP-TETRAFLUOROETH EX AERO
INHALATION_SPRAY | Freq: Once | CUTANEOUS | Status: DC
  Filled 2020-12-27: qty 116

## 2020-12-27 MED ORDER — ROPIVACAINE HCL 2 MG/ML IJ SOLN
9.0000 mL | Freq: Once | INTRAMUSCULAR | Status: AC
  Administered 2020-12-27: 9 mL via PERINEURAL

## 2020-12-27 MED ORDER — LIDOCAINE HCL (PF) 1 % IJ SOLN
INTRAMUSCULAR | Status: AC
  Filled 2020-12-27: qty 5

## 2020-12-27 MED ORDER — LACTATED RINGERS IV SOLN
1000.0000 mL | Freq: Once | INTRAVENOUS | Status: AC
  Administered 2020-12-27: 1000 mL via INTRAVENOUS

## 2020-12-27 MED ORDER — MIDAZOLAM HCL 5 MG/5ML IJ SOLN
0.5000 mg | Freq: Once | INTRAMUSCULAR | Status: AC
Start: 2020-12-27 — End: 2020-12-27
  Administered 2020-12-27: 2 mg via INTRAVENOUS
  Filled 2020-12-27: qty 5

## 2020-12-27 NOTE — Patient Instructions (Signed)

## 2020-12-27 NOTE — Progress Notes (Addendum)
PROVIDER NOTE: Information contained herein reflects review and annotations entered in association with encounter. Interpretation of such information and data should be left to medically-trained personnel. Information provided to patient can be located elsewhere in the medical record under "Patient Instructions". Document created using STT-dictation technology, any transcriptional errors that may result from process are unintentional.    Patient: Georga Kaufmann  Service Category: Procedure Provider: Gaspar Cola, MD DOB: 27-Nov-1960 DOS: 12/27/2020 Location: [facility] MRN: [id number] Setting: Ambulatory - outpatient Referring Provider: Milinda Pointer, MD Type: Established Patient Specialty: Interventional Pain Management PCP: Tracie Harrier, MD  Primary Reason for Visit: Interventional Pain Management Treatment. CC: Back Pain (Right, lower)    Procedure:          Type: Lumbar Facet, Medial Branch Block(s)          Primary Purpose: Diagnostic (no steroids) Region: Posterolateral Lumbosacral Spine Level: L2, L3, L4, L5, & S1 Medial Branch Level(s). Injecting these levels blocks the L3-4, L4-5, and L5-S1 lumbar facet joints. Laterality: Right Anesthesia: Local (1-2% Lidocaine)  Anxiolysis: IV (Versed 2 mg) Sedation: None  Guidance: Fluoroscopy          Indications: 1. Lumbar facet syndrome (Bilateral)   2. Spondylosis without myelopathy or radiculopathy, lumbosacral region   3. Lumbar facet hypertrophy   4. Lumbar facet arthropathy   5. 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 DDD (degenerative disc disease), lumbar   7. Chronic low back pain (Bilateral) w/o sciatica    Pain Score: Pre-procedure: 1 /10 Post-procedure: 0-No pain/10     Position: Prone  Pre-op H&P Assessment:  Ms. Baumbach is a 60 y.o. (year old), female patient, seen today for interventional treatment. She  has a past surgical history that includes arthroscopic rotator cuff;  Colonoscopy; Abdominal hysterectomy; endosopic sinus; and Cataract extraction w/PHACO (Right, 11/25/2017). Ms. Broadfoot has a current medication list which includes the following prescription(s): alendronate, carvedilol, dicyclomine, duloxetine, hydralazine, toujeo max solostar, insulin lispro, lisinopril, metformin, pantoprazole, polyethylene glycol, pravastatin, pregabalin, sucralfate, tramadol, amlodipine, baclofen, vitamin d3, freestyle libre sensor system, and magnesium, and the following Facility-Administered Medications: pentafluoroprop-tetrafluoroeth. Her primarily concern today is the Back Pain (Right, lower)  Initial Vital Signs:  Pulse/HCG Rate: 82ECG Heart Rate: 99 Temp: (!) 97.5 F (36.4 C) Resp: 16 BP: 130/75 SpO2: 100 %  BMI: Estimated body mass index is 31.91 kg/m as calculated from the following:   Height as of this encounter: 4\' 11"  (1.499 m).   Weight as of this encounter: 158 lb (71.7 kg).  Risk Assessment: Allergies: Reviewed. She has No Known Allergies.  Allergy Precautions: None required Coagulopathies: Reviewed. None identified.  Blood-thinner therapy: None at this time Active Infection(s): Reviewed. None identified. Ms. Hibberd is afebrile  Site Confirmation: Ms. Selvey was asked to confirm the procedure and laterality before marking the site Procedure checklist: Completed Consent: Before the procedure and under the influence of no sedative(s), amnesic(s), or anxiolytics, the patient was informed of the treatment options, risks and possible complications. To fulfill our ethical and legal obligations, as recommended by the American Medical Association's Code of Ethics, I have informed the patient of my clinical impression; the nature and purpose of the treatment or procedure; the risks, benefits, and possible complications of the intervention; the alternatives, including doing nothing; the risk(s) and benefit(s) of the alternative treatment(s) or procedure(s); and the  risk(s) and benefit(s) of doing nothing. The patient was provided information about the general risks and possible complications associated with the  procedure. These may include, but are not limited to: failure to achieve desired goals, infection, bleeding, organ or nerve damage, allergic reactions, paralysis, and death. In addition, the patient was informed of those risks and complications associated to Spine-related procedures, such as failure to decrease pain; infection (i.e.: Meningitis, epidural or intraspinal abscess); bleeding (i.e.: epidural hematoma, subarachnoid hemorrhage, or any other type of intraspinal or peri-dural bleeding); organ or nerve damage (i.e.: Any type of peripheral nerve, nerve root, or spinal cord injury) with subsequent damage to sensory, motor, and/or autonomic systems, resulting in permanent pain, numbness, and/or weakness of one or several areas of the body; allergic reactions; (i.e.: anaphylactic reaction); and/or death. Furthermore, the patient was informed of those risks and complications associated with the medications. These include, but are not limited to: allergic reactions (i.e.: anaphylactic or anaphylactoid reaction(s)); adrenal axis suppression; blood sugar elevation that in diabetics may result in ketoacidosis or comma; water retention that in patients with history of congestive heart failure may result in shortness of breath, pulmonary edema, and decompensation with resultant heart failure; weight gain; swelling or edema; medication-induced neural toxicity; particulate matter embolism and blood vessel occlusion with resultant organ, and/or nervous system infarction; and/or aseptic necrosis of one or more joints. Finally, the patient was informed that Medicine is not an exact science; therefore, there is also the possibility of unforeseen or unpredictable risks and/or possible complications that may result in a catastrophic outcome. The patient indicated having  understood very clearly. We have given the patient no guarantees and we have made no promises. Enough time was given to the patient to ask questions, all of which were answered to the patient's satisfaction. Ms. Rippetoe has indicated that she wanted to continue with the procedure. Attestation: I, the ordering provider, attest that I have discussed with the patient the benefits, risks, side-effects, alternatives, likelihood of achieving goals, and potential problems during recovery for the procedure that I have provided informed consent. Date  Time: 12/27/2020  8:32 AM  Pre-Procedure Preparation:  Monitoring: As per clinic protocol. Respiration, ETCO2, SpO2, BP, heart rate and rhythm monitor placed and checked for adequate function Safety Precautions: Patient was assessed for positional comfort and pressure points before starting the procedure. Time-out: I initiated and conducted the "Time-out" before starting the procedure, as per protocol. The patient was asked to participate by confirming the accuracy of the "Time Out" information. Verification of the correct person, site, and procedure were performed and confirmed by me, the nursing staff, and the patient. "Time-out" conducted as per Joint Commission's Universal Protocol (UP.01.01.01). Time: 0912  Description of Procedure:          Laterality: Right Levels:  L2, L3, L4, L5, & S1 Medial Branch Level(s) Area Prepped: Posterior Lumbosacral Region DuraPrep (Iodine Povacrylex [0.7% available iodine] and Isopropyl Alcohol, 74% w/w) Safety Precautions: Aspiration looking for blood return was conducted prior to all injections. At no point did we inject any substances, as a needle was being advanced. Before injecting, the patient was told to immediately notify me if she was experiencing any new onset of "ringing in the ears, or metallic taste in the mouth". No attempts were made at seeking any paresthesias. Safe injection practices and needle disposal  techniques used. Medications properly checked for expiration dates. SDV (single dose vial) medications used. After the completion of the procedure, all disposable equipment used was discarded in the proper designated medical waste containers. Local Anesthesia: Protocol guidelines were followed. The patient was positioned over the fluoroscopy table. The  area was prepped in the usual manner. The time-out was completed. The target area was identified using fluoroscopy. A 12-in long, straight, sterile hemostat was used with fluoroscopic guidance to locate the targets for each level blocked. Once located, the skin was marked with an approved surgical skin marker. Once all sites were marked, the skin (epidermis, dermis, and hypodermis), as well as deeper tissues (fat, connective tissue and muscle) were infiltrated with a small amount of a short-acting local anesthetic, loaded on a 10cc syringe with a 25G, 1.5-in  Needle. An appropriate amount of time was allowed for local anesthetics to take effect before proceeding to the next step. Local Anesthetic: Lidocaine 2.0% The unused portion of the local anesthetic was discarded in the proper designated containers. Technical explanation of process:  L2 Medial Branch Nerve Block (MBB): The target area for the L2 medial branch is at the junction of the postero-lateral aspect of the superior articular process and the superior, posterior, and medial edge of the transverse process of L3. Under fluoroscopic guidance, a Quincke needle was inserted until contact was made with os over the superior postero-lateral aspect of the pedicular shadow (target area). After negative aspiration for blood, 0.5 mL of the nerve block solution was injected without difficulty or complication. The needle was removed intact. L3 Medial Branch Nerve Block (MBB): The target area for the L3 medial branch is at the junction of the postero-lateral aspect of the superior articular process and the superior,  posterior, and medial edge of the transverse process of L4. Under fluoroscopic guidance, a Quincke needle was inserted until contact was made with os over the superior postero-lateral aspect of the pedicular shadow (target area). After negative aspiration for blood, 0.5 mL of the nerve block solution was injected without difficulty or complication. The needle was removed intact. L4 Medial Branch Nerve Block (MBB): The target area for the L4 medial branch is at the junction of the postero-lateral aspect of the superior articular process and the superior, posterior, and medial edge of the transverse process of L5. Under fluoroscopic guidance, a Quincke needle was inserted until contact was made with os over the superior postero-lateral aspect of the pedicular shadow (target area). After negative aspiration for blood, 0.5 mL of the nerve block solution was injected without difficulty or complication. The needle was removed intact. L5 Medial Branch Nerve Block (MBB): The target area for the L5 medial branch is at the junction of the postero-lateral aspect of the superior articular process and the superior, posterior, and medial edge of the sacral ala. Under fluoroscopic guidance, a Quincke needle was inserted until contact was made with os over the superior postero-lateral aspect of the pedicular shadow (target area). After negative aspiration for blood, 0.5 mL of the nerve block solution was injected without difficulty or complication. The needle was removed intact. S1 Medial Branch Nerve Block (MBB): The target area for the S1 medial branch is at the posterior and inferior 6 o'clock position of the L5-S1 facet joint. Under fluoroscopic guidance, the Quincke needle inserted for the L5 MBB was redirected until contact was made with os over the inferior and postero aspect of the sacrum, at the 6 o' clock position under the L5-S1 facet joint (Target area). After negative aspiration for blood, 0.5 mL of the nerve block  solution was injected without difficulty or complication. The needle was removed intact.  Nerve block solution: 0.2% PF-Ropivacaine           The unused portion of the solution  was discarded in the proper designated containers. Procedural Needles: 22-gauge, 3.5-inch, Quincke needles used for all levels.  Once the entire procedure was completed, the treated area was cleaned, making sure to leave some of the prepping solution back to take advantage of its long term bactericidal properties.      Illustration of the posterior view of the lumbar spine and the posterior neural structures. Laminae of L2 through S1 are labeled. DPRL5, dorsal primary ramus of L5; DPRS1, dorsal primary ramus of S1; DPR3, dorsal primary ramus of L3; FJ, facet (zygapophyseal) joint L3-L4; I, inferior articular process of L4; LB1, lateral branch of dorsal primary ramus of L1; IAB, inferior articular branches from L3 medial branch (supplies L4-L5 facet joint); IBP, intermediate branch plexus; MB3, medial branch of dorsal primary ramus of L3; NR3, third lumbar nerve root; S, superior articular process of L5; SAB, superior articular branches from L4 (supplies L4-5 facet joint also); TP3, transverse process of L3.  Vitals:   12/27/20 0910 12/27/20 0915 12/27/20 0920 12/27/20 0923  BP: (!) 149/93 (!) 155/90 (!) 151/89 (!) 158/81  Pulse: 88 80 81   Resp: 12 13 13 16   Temp:    97.7 F (36.5 C)  TempSrc:    Temporal  SpO2: 98% 97% 98% 100%  Weight:      Height:         Start Time: 0912 hrs. End Time: 0917 hrs.  Imaging Guidance (Spinal):          Type of Imaging Technique: Fluoroscopy Guidance (Spinal) Indication(s): Assistance in needle guidance and placement for procedures requiring needle placement in or near specific anatomical locations not easily accessible without such assistance. Exposure Time: Please see nurses notes. Contrast: None used. Fluoroscopic Guidance: I was personally present during the use of  fluoroscopy. "Tunnel Vision Technique" used to obtain the best possible view of the target area. Parallax error corrected before commencing the procedure. "Direction-depth-direction" technique used to introduce the needle under continuous pulsed fluoroscopy. Once target was reached, antero-posterior, oblique, and lateral fluoroscopic projection used confirm needle placement in all planes. Images permanently stored in EMR. Interpretation: No contrast injected. I personally interpreted the imaging intraoperatively. Adequate needle placement confirmed in multiple planes. Permanent images saved into the patient's record.  Antibiotic Prophylaxis:   Anti-infectives (From admission, onward)    None      Indication(s): None identified  Post-operative Assessment:  Post-procedure Vital Signs:  Pulse/HCG Rate: 81 (nsr)99 Temp: 97.7 F (36.5 C) Resp: 16 BP: (!) 158/81 SpO2: 100 %  EBL: None  Complications: No immediate post-treatment complications observed by team, or reported by patient.  Note: The patient tolerated the entire procedure well. A repeat set of vitals were taken after the procedure and the patient was kept under observation following institutional policy, for this type of procedure. Post-procedural neurological assessment was performed, showing return to baseline, prior to discharge. The patient was provided with post-procedure discharge instructions, including a section on how to identify potential problems. Should any problems arise concerning this procedure, the patient was given instructions to immediately contact us, at any time, without hesitation. In any case, we plan to contact the patient by telephone for a follow-up status report regarding this interventional procedure.  Comments:  No additional relevant information.  Plan of Care  Orders:  Orders Placed This Encounter  Procedures   LUMBAR FACET(MEDIAL BRANCH NERVE BLOCK) MBNB    Scheduling Instructions:      Procedure: Lumbar facet block (AKA.: Lumbosacral medial branch nerve block)  Side: Right-sided     Level: L3-4, L4-5, & L5-S1 Facets (L2, L3, L4, L5, & S1 Medial Branch Nerves)     Sedation: Patient's choice.     Timeframe: Today    Order Specific Question:   Where will this procedure be performed?    Answer:   [facility]   DG [facility] C-ARM 1-60 MIN NO REPORT    Intraoperative interpretation by procedural physician at [facility].    Standing Status:   Standing    Number of Occurrences:   1    Order Specific Question:   Reason for exam:    Answer:   Assistance in needle guidance and placement for procedures requiring needle placement in or near specific anatomical locations not easily accessible without such assistance.   Informed Consent Details: Physician/Practitioner Attestation; Transcribe to consent form and obtain patient signature    Nursing Order: Transcribe to consent form and obtain patient signature. Note: Always confirm laterality of pain with [name], before procedure.    Order Specific Question:   Physician/Practitioner attestation of informed consent for procedure/surgical case    Answer:   I, the physician/practitioner, attest that I have discussed with the patient the benefits, risks, side effects, alternatives, likelihood of achieving goals and potential problems during recovery for the procedure that I have provided informed consent.    Order Specific Question:   Procedure    Answer:   Lumbar Facet Block  under fluoroscopic guidance    Order Specific Question:   Physician/Practitioner performing the procedure    Answer:   Rivers Hamrick A. Dossie Arbour MD    Order Specific Question:   Indication/Reason    Answer:   Low Back Pain, with our without leg pain, due to Facet Joint Arthralgia (Joint Pain) Spondylosis (Arthritis of the Spine), without myelopathy or radiculopathy (Nerve Damage).   Provide equipment / supplies at bedside    "Block Tray"  (Disposable  single use) Needle type: SpinalSpinal Amount/quantity: 4 Size: Regular (3.5-inch) Gauge: 22G    Standing Status:   Standing    Number of Occurrences:   1    Order Specific Question:   Specify    Answer:   Block Tray    Chronic Opioid Analgesic:  No chronic opioid analgesics therapy prescribed by our practice. ABNORMAL UDS (09/03/2020) (+) for COCAINE.   Medications ordered for procedure: Meds ordered this encounter  Medications   lidocaine (XYLOCAINE) 2 % (with pres) injection 400 mg   pentafluoroprop-tetrafluoroeth (GEBAUERS) aerosol   lactated ringers infusion 1,000 mL   midazolam (VERSED) 5 MG/5ML injection 0.5-2 mg    Make sure Flumazenil is available in the pyxis when using this medication. If oversedation occurs, administer 0.2 mg IV over 15 sec. If after 45 sec no response, administer 0.2 mg again over 1 min; may repeat at 1 min intervals; not to exceed 4 doses (1 mg)   ropivacaine (PF) 2 mg/mL (0.2%) (NAROPIN) injection 9 mL    Medications administered: We administered lidocaine, lactated ringers, midazolam, and ropivacaine (PF) 2 mg/mL (0.2%).  See the medical record for exact dosing, route, and time of administration.  Follow-up plan:   Return in about 2 weeks (around 01/10/2021) for Proc-day (T,Th), (VV), (PPE).     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 + steroids)  Diagnostic bilateral genicular NB  Possible bilateral genicular nerve RFA    Palliative PRN treatment(s):   Palliative left L5 TFESI #4 + left L4-5 interlaminar LESI #4  Palliative left L5 TFESI #4 Palliative left L4-5 interlaminar LESI #4  Palliative left L5-S1 LESI #3  Palliative bilateral Hyalgan knee series  S2N1 (last done on 02/25/2018)       Recent Visits Date Type Provider Dept  12/06/20 Office Visit Milinda Pointer, MD [facility]  10/25/20 Office Visit Milinda Pointer, MD [facility]  10/09/20 Procedure visit Milinda Pointer, MD [facility]  Showing recent visits within past 90 days and meeting all other requirements Today's Visits Date Type Provider Dept  12/27/20 Procedure visit Milinda Pointer, MD [facility]  Showing today's visits and meeting all other requirements Future Appointments Date Type Provider Dept  01/15/21 Appointment Milinda Pointer, MD [facility]  Showing future appointments within next 90 days and meeting all other requirements Disposition: Discharge home  Discharge (Date  Time): 12/27/2020; 0929 hrs.   Primary Care Physician: Tracie Harrier, MD Location: [facility] Note by: Gaspar Cola, MD Date: 12/27/2020; Time: 10:41 AM  Disclaimer:  Medicine is not an Chief Strategy Officer. The only guarantee in medicine is that nothing is guaranteed. It is important to note that the decision to proceed with this intervention was based on the information collected from the patient. The Data and conclusions were drawn from the patient's questionnaire, the interview, and the physical examination. Because the information was provided in large part by the patient, it cannot be guaranteed that it has not been purposely or unconsciously manipulated. Every effort has been made to obtain as much relevant data as possible for this evaluation. It is important to note that the conclusions that lead to this procedure are derived in large part from the available data. Always take into account that the treatment will also be dependent on availability of resources and existing treatment guidelines, considered by other Pain Management Practitioners as being common knowledge and practice, at the time of  the intervention. For Medico-Legal purposes, it is also important to point out that variation in procedural techniques and pharmacological choices are the acceptable norm. The indications, contraindications, technique, and results of the above procedure should only be interpreted and judged by a Board-Certified Interventional Pain Specialist with extensive familiarity and expertise in the same exact procedure and technique.

## 2020-12-28 ENCOUNTER — Telehealth: Payer: Self-pay

## 2020-12-28 NOTE — Telephone Encounter (Signed)
Post procedure phone call.  Patient states she is doing fine.  

## 2021-01-14 NOTE — Progress Notes (Signed)
Patient: Charlene [name]  Service Category: E/M  Provider: Gaspar Cola, MD  DOB: 1960-12-15  DOS: 01/15/2021  Location: Office  MRN: [id number]  Setting: Ambulatory outpatient  Referring Provider: Tracie Harrier, MD  Type: Established Patient  Specialty: Interventional Pain Management  PCP: Tracie Harrier, MD  Location: Remote location  Delivery: TeleHealth     Virtual Encounter - Pain Management PROVIDER NOTE: Information contained herein reflects review and annotations entered in association with encounter. Interpretation of such information and data should be left to medically-trained personnel. Information provided to patient can be located elsewhere in the medical record under "Patient Instructions". Document created using STT-dictation technology, any transcriptional errors that may result from process are unintentional.    Contact & Pharmacy Preferred: [phone] Home: [phone] (home) Mobile: [phone] (mobile) E-mail: [email or web address]  [place], [name] - [address] [name] [address] Phone: [phone] Fax: [phone]   Pre-screening  Charlene [name] offered "in-person" vs "virtual" encounter. She indicated preferring virtual for this encounter.   Reason COVID-19*   Social distancing based on CDC and AMA recommendations.   I contacted Charlene [name] on 01/15/2021 via telephone.      I clearly identified myself as Gaspar Cola, MD. I verified that I was speaking with the correct person using two identifiers (Name: Charlene [name], and date of birth: 1960-10-07).  Consent I sought verbal advanced consent from Charlene [name] for virtual visit interactions. I informed Charlene [name] of possible security and privacy concerns, risks, and limitations associated with providing "not-in-person" medical evaluation and management services. I also informed Charlene [name] of the availability of "in-person"  appointments. Finally, I informed her that there would be a charge for the virtual visit and that she could be  personally, fully or partially, financially responsible for it. Charlene [name] expressed understanding and agreed to proceed.   Historic Elements   Charlene [name] is a 60 y.o. year old, female patient evaluated today after our last contact on 12/27/2020. Charlene [name]  has a past medical history of Arthritis, Degenerative disc disease, lumbar, Diabetes mellitus without complication (Gray), GERD (gastroesophageal reflux disease), Hypertension, Neuropathy, Polyneuropathy, and Polyneuropathy. She also  has a past surgical history that includes arthroscopic rotator cuff; Colonoscopy; Abdominal hysterectomy; endosopic sinus; and Cataract extraction w/PHACO (Right, 11/25/2017). Charlene [name] has a current medication list which includes the following prescription(s): alendronate, carvedilol, freestyle libre sensor system, dicyclomine, duloxetine, hydralazine, toujeo max solostar, insulin lispro, lisinopril, metformin, pantoprazole, polyethylene glycol, pravastatin, pregabalin, sucralfate, amlodipine, baclofen, vitamin d3, magnesium, and tramadol. She  reports that she has never smoked. She has never used smokeless tobacco. She reports that she does not currently use alcohol. She reports that she does not use drugs. Charlene [name] is allergic to semaglutide.   HPI  Today, she is being contacted for a post-procedure assessment.  The patient indicates having attained 100% relief of the pain for the duration of the local anesthetic which in fact has persisted and at this point she has an ongoing 100% relief despite the fact that we did not use any steroids.  The patient indicates currently doing well at this point and not needing to have anything else done for the time being.  I have encouraged the patient to give me a call when the pain starts coming back.  Post-procedure evaluation     Procedure:          Type:  Lumbar Facet, Medial Branch Block(s)  Primary Purpose: Diagnostic (no steroids) °Region: Posterolateral Lumbosacral Spine °Level: L2, L3, L4, L5, & S1 Medial Branch Level(s). Injecting these levels blocks the L3-4, L4-5, and L5-S1 lumbar facet joints. °Laterality: Right °Anesthesia: Local (1-2% Lidocaine)  °Anxiolysis: IV (Versed 2 mg) °Sedation: None  °Guidance: Fluoroscopy         ° °Indications: °1. Lumbar facet syndrome (Bilateral)   °2. Spondylosis without myelopathy or radiculopathy, lumbosacral region   °3. Lumbar facet hypertrophy   °4. Lumbar facet arthropathy   °5. 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)   °6. DDD (degenerative disc disease), lumbar   °7. Chronic low back pain (Bilateral) w/o sciatica   ° °Pain Score: °Pre-procedure: 1 /10 °Post-procedure: 0-No pain/10  ° °   °Effectiveness:  °Initial hour after procedure: 100 %. °Subsequent 4-6 hours post-procedure: 100 %. °Analgesia past initial 6 hours: 75 % (ongoing). °Ongoing improvement:  °Analgesic: The patient indicates having an ongoing 100% relief of the pain. °Function: Charlene [name] reports improvement in function °ROM: Charlene [name] reports improvement in ROM ° °Pharmacotherapy Assessment  ° °Opioid Analgesic: No chronic opioid analgesics therapy prescribed by our practice. ABNORMAL UDS (09/03/2020) (+) for COCAINE.  ° °Monitoring: °[place] PMP: PDMP reviewed during this encounter.       °Pharmacotherapy: No side-effects or adverse reactions reported. °Compliance: No problems identified. °Effectiveness: Clinically acceptable. °Plan: Refer to "POC". UDS:  °Summary  °Date Value Ref Range Status  °09/03/2020 Note  Final  °  Comment:  °  ==================================================================== °Compliance Drug Analysis, Ur °==================================================================== °Test                             Result       Flag       Units ° °Drug Present and Declared for Prescription Verification °  Pregabalin                      PRESENT      EXPECTED °  Baclofen                       PRESENT      EXPECTED °  Duloxetine                     PRESENT      EXPECTED ° °Drug Present not Declared for Prescription Verification °  Benzoylecgonine                38           UNEXPECTED ng/mg creat °   Benzoylecgonine is a metabolite of cocaine; its presence indicates °   use of this drug.  Source is most commonly illicit, but cocaine is °   present in some topical anesthetic solutions. ° °  Nortriptyline                  PRESENT      UNEXPECTED °   Nortriptyline may be administered as a prescription drug; it is also °   an expected metabolite of amitriptyline. ° °Drug Absent but Declared for Prescription Verification °  Diazepam                       Not Detected UNEXPECTED ng/mg creat °  Tramadol                       Not Detected   UNEXPECTED ng/mg creat   Lidocaine                      Not Detected UNEXPECTED    Lidocaine, as indicated in the declared medication list, is not    always detected even when used as directed.  ==================================================================== Test                      Result    Flag   Units      Ref Range   Creatinine              145              mg/dL      >=20 ==================================================================== Declared Medications:  The flagging and interpretation on this report are based on the  following declared medications.  Unexpected results may arise from  inaccuracies in the declared medications.   **Note: The testing scope of this panel includes these medications:   Baclofen (Lioresal)  Diazepam (Valium)  Duloxetine (Cymbalta)  Pregabalin (Lyrica)  Tramadol (Ultram)   **Note: The testing scope of this panel does not include small to  moderate amounts of these reported medications:   Topical Lidocaine   **Note: The testing scope of this panel does not include the  following reported medications:   Alendronate (Fosamax)   Amlodipine (Norvasc)  Carvedilol (Coreg)  Dapagliflozin (Xigduo)  Dicyclomine (Bentyl)  Hydralazine (Apresoline)  Insulin (Toujeo)  Lisinopril (Zestril)  Magnesium  Menthol  Metformin (Xigduo)  Metformin (Glucophage)  Pantoprazole (Protonix)  Polyethylene Glycol (MiraLAX)  Sucralfate (Carafate)  Vitamin D3 ==================================================================== For clinical consultation, please call [phone]. ====================================================================      Laboratory Chemistry Profile   Renal Lab Results  Component Value Date   BUN 13 12/23/2018   CREATININE 0.59 12/23/2018   BCR 17 10/12/2017   GFRAA >60 12/23/2018   GFRNONAA >60 12/23/2018    Hepatic Lab Results  Component Value Date   AST 17 10/21/2018   ALT 21 10/21/2018   ALBUMIN 3.6 10/21/2018   ALKPHOS 81 10/21/2018   LIPASE 20 10/21/2018    Electrolytes Lab Results  Component Value Date   NA 137 12/23/2018   K 4.1 12/23/2018   CL 100 12/23/2018   CALCIUM 9.8 12/23/2018   MG 2.0 10/12/2017    Bone Lab Results  Component Value Date   25OHVITD1 17 (L) 10/12/2017   25OHVITD2 <1.0 10/12/2017   25OHVITD3 17 10/12/2017    Inflammation (CRP: Acute Phase) (ESR: Chronic Phase) Lab Results  Component Value Date   CRP 4 10/12/2017   ESRSEDRATE 12 10/12/2017         Note: Above Lab results reviewed.  Imaging  DG PAIN CLINIC C-ARM 1-60 MIN NO REPORT Fluoro was used, but no Radiologist interpretation will be provided.  Please refer to "NOTES" tab for provider progress note.  Assessment  The primary encounter diagnosis was Lumbar facet syndrome (Bilateral). Diagnoses of Chronic low back pain (Bilateral) w/o sciatica and 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) were also pertinent to this visit.  Plan of Care  Problem-specific:  No problem-specific Assessment & Plan notes found for this encounter.  Charlene [name] has a current medication list  which includes the following long-term medication(s): carvedilol, dicyclomine, duloxetine, hydralazine, toujeo max solostar, insulin lispro, lisinopril, pantoprazole, pravastatin, pregabalin, sucralfate, amlodipine, baclofen, vitamin d3, magnesium, and tramadol.  Pharmacotherapy (Medications Ordered): No orders of the defined types were placed in this  encounter.  Orders:  No orders of the defined types were placed in this encounter.  Follow-up plan:   Return if symptoms worsen or fail to improve.     Interventional Therapies  Risk   Complexity Considerations:   Estimated body mass index is 31.91 kg/m as calculated from the following:   Height as of 12/27/20: 4' 11" (1.499 m).   Weight as of 12/27/20: 158 lb (71.7 kg).   NOTE: UNCONTROLLED IDDM (NO STEROIDS)     Planned   Pending:      Under consideration:   Possible bilateral lumbar facet RFA  Diagnostic bilateral genicular NB    Completed:   Diagnostic right lumbar facet MBB x2 (no steroid) (12/27/2020) (100/100/75/100)  Diagnostic left lumbar facet MBB x1 (w/ steroid) (10/09/2020) (100/100/90/L-100; R-50)  Therapeutic left L5 TFESI x4 (09/21/2018) (100/100/50/70)  Therapeutic left L4-5 LESI x4 (09/21/2018) (100/100/50/70)  Therapeutic left L5-S1 LESI x2 (02/25/2018) (100/100/100/100)  Therapeutic bilateral Hyalgan knee inj. x5 (02/25/2018) (100/100/100 x2 weeks/<50)  Therapeutic left steroid injection x1 (11/23/2017) (100/100/0/0)    Therapeutic   Palliative (PRN) options:   Palliative L5 TFESI  Palliative L4-5 LESI   Palliative L5-S1 LESI   Palliative Hyalgan knee inj.    Recent Visits Date Type Provider Dept  12/27/20 Procedure visit Milinda Pointer, MD [facility]  12/06/20 Office Visit Milinda Pointer, MD [facility]  10/25/20 Office Visit Milinda Pointer, MD [facility]  Showing recent visits within past 90 days and meeting all other requirements Today's Visits Date Type Provider  Dept  01/15/21 Office Visit Milinda Pointer, MD [facility]  Showing today's visits and meeting all other requirements Future Appointments No visits were found meeting these conditions. Showing future appointments within next 90 days and meeting all other requirements  I discussed the assessment and treatment plan with the patient. The patient was provided an opportunity to ask questions and all were answered. The patient agreed with the plan and demonstrated an understanding of the instructions.  Patient advised to call back or seek an in-person evaluation if the symptoms or condition worsens.  Duration of encounter: 13 minutes.  Note by: Gaspar Cola, MD Date: 01/15/2021; Time: 4:23 PM

## 2021-01-15 ENCOUNTER — Ambulatory Visit: Payer: Medicare Other | Attending: Pain Medicine | Admitting: Pain Medicine

## 2021-01-15 ENCOUNTER — Other Ambulatory Visit: Payer: Self-pay

## 2021-01-15 DIAGNOSIS — M4316 Spondylolisthesis, lumbar region: Secondary | ICD-10-CM

## 2021-01-15 DIAGNOSIS — M545 Low back pain, unspecified: Secondary | ICD-10-CM | POA: Diagnosis not present

## 2021-01-15 DIAGNOSIS — M47816 Spondylosis without myelopathy or radiculopathy, lumbar region: Secondary | ICD-10-CM | POA: Diagnosis not present

## 2021-01-15 DIAGNOSIS — G8929 Other chronic pain: Secondary | ICD-10-CM | POA: Diagnosis not present

## 2021-03-10 NOTE — Progress Notes (Addendum)
PROVIDER NOTE: Information contained herein reflects review and annotations entered in association with encounter. Interpretation of such information and data should be left to medically-trained personnel. Information provided to patient can be located elsewhere in the medical record under "Patient Instructions". Document created using STT-dictation technology, any transcriptional errors that may result from process are unintentional.    Patient: Charlene [name]  Service Category: E/M  Provider: Gaspar Cola, MD  DOB: 11-13-1960  DOS: 03/11/2021  Specialty: Interventional Pain Management  MRN: [id number]  Setting: Ambulatory outpatient  PCP: Tracie Harrier, MD  Type: Established Patient    Referring Provider: Tracie Harrier, MD  Location: Office  Delivery: Face-to-face     HPI  Charlene [name], a 61 y.o. year old female, is here today because of her Chronic bilateral low back pain with left-sided sciatica [M54.42, G89.29]. Charlene [name] primary complain today is Back Pain (lower) Last encounter: My last encounter with her was on 01/15/2021. 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[place]); Chronic ankle pain (Left); Chronic foot pain (Left); Chronic lower extremity pain (2ry area of Pain) (Bilateral) (L>R); Chronic knee pain (4th area of Pain) (Bilateral) (L>R); Chronic low back pain (3ry area of Pain) (Bilateral) (L>R) w/ sciatica (Left); Chronic pain syndrome; DM type 2 with diabetic peripheral neuropathy (HCC); DDD (degenerative disc disease), lumbar; Lumbar facet arthropathy; Lumbar facet syndrome; Lumbar facet hypertrophy; Lumbar foraminal stenosis (L5-S1) (Right); Osteoarthritis of knee (Bilateral); Chronic musculoskeletal pain; Abnormal EMG (07/06/2017); Osteoarthritis involving multiple joints; 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 ([place]); Knee pain; Back pain with left-sided sciatica; Meningioma ([place]); Neck pain; Chronic pain disorder; Left leg weakness; Meralgia paraesthetica, left; Pain in both lower extremities; Arthropathy of lumbar facet joint; Chronic ankle and foot pain (1ry area of Pain) (Left); 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 Pain Assessment: Severity of Chronic pain is reported as a 2 /10. Location: Back Right, Lower/Denies. Onset: More than a month ago. Quality: Dull, Aching. Timing: Constant. Modifying factor(s): meds. Vitals:  height is $RemoveB'4\' 11"'PNAlhGnq$  (9.379 m) and weight is 158 lb (71.7 kg). Her temperature is 98.1 F (36.7 C). Her blood pressure is 146/82 (abnormal) and her pulse is 80. Her oxygen saturation is 100%.   Reason for encounter: patient-requested evaluation.  The patient comes into the clinic today indicating that she has had a recurrence of her low back pain.  It started all across the lower back, but it now has subsided to only the right side.  Physical exam today was positive for lumbar facet arthralgia on the right side with hyperextension on rotation maneuver and Kemp maneuver.  Provocative Patrick maneuver was also positive on the right side for sacroiliac joint arthralgia.  Negative for hip joint pain or decreased range of motion.  Straight leg raise was negative bilaterally today.  Today the patient requested a refill on her tramadol, but we had stopped secondary to a UDS positive for cocaine.  Today we will go ahead and order another UDS for reevaluation.  Today the patient continues to deny her use of cocaine and refers that she has no idea where that came from. UDS ordered today.   Today we will be ordering  x-rays  of the lumbar spine and sacroiliac joint and we will tentatively schedule the patient to return for a diagnostic/therapeutic right-sided lumbar facet block.  Pharmacotherapy Assessment  Analgesic: No chronic opioid analgesics therapy prescribed by our practice. ABNORMAL UDS (09/03/2020) (+) for COCAINE.   Monitoring: [place] PMP: PDMP reviewed during this encounter.       Pharmacotherapy: No side-effects or adverse reactions reported. Compliance: No problems identified. Effectiveness: Clinically acceptable.  Brigitte Pulse, RN  03/11/2021  9:05 AM  Signed Safety precautions to be maintained throughout the outpatient stay will include: orient to surroundings, keep bed in low position, maintain call bell within reach at all times, provide assistance with transfer out of bed and ambulation.     UDS:  Summary  Date Value Ref Range Status  09/03/2020 Note  Final    Comment:    ==================================================================== Compliance Drug Analysis, Ur ==================================================================== Test                             Result       Flag       Units  Drug Present and Declared for Prescription Verification   Pregabalin                     PRESENT      EXPECTED   Baclofen                       PRESENT      EXPECTED   Duloxetine                     PRESENT      EXPECTED  Drug Present not Declared for Prescription Verification   Benzoylecgonine                38           UNEXPECTED ng/mg creat    Benzoylecgonine is a metabolite of cocaine; its presence indicates    use of this drug.  Source is most commonly illicit, but cocaine is    present in some topical anesthetic solutions.    Nortriptyline                  PRESENT      UNEXPECTED    Nortriptyline may be administered as a prescription drug; it is also    an expected metabolite of amitriptyline.  Drug Absent but Declared for Prescription Verification   Diazepam                        Not Detected UNEXPECTED ng/mg creat   Tramadol                       Not Detected UNEXPECTED ng/mg creat   Lidocaine                      Not Detected UNEXPECTED    Lidocaine, as indicated in the declared medication list, is not    always detected even when used as directed.  ==================================================================== Test                      Result    Flag   Units      Ref Range   Creatinine              145  mg/dL      >=20 ==================================================================== Declared Medications:  The flagging and interpretation on this report are based on the  following declared medications.  Unexpected results may arise from  inaccuracies in the declared medications.   **Note: The testing scope of this panel includes these medications:   Baclofen (Lioresal)  Diazepam (Valium)  Duloxetine (Cymbalta)  Pregabalin (Lyrica)  Tramadol (Ultram)   **Note: The testing scope of this panel does not include small to  moderate amounts of these reported medications:   Topical Lidocaine   **Note: The testing scope of this panel does not include the  following reported medications:   Alendronate (Fosamax)  Amlodipine (Norvasc)  Carvedilol (Coreg)  Dapagliflozin (Xigduo)  Dicyclomine (Bentyl)  Hydralazine (Apresoline)  Insulin (Toujeo)  Lisinopril (Zestril)  Magnesium  Menthol  Metformin (Xigduo)  Metformin (Glucophage)  Pantoprazole (Protonix)  Polyethylene Glycol (MiraLAX)  Sucralfate (Carafate)  Vitamin D3 ==================================================================== For clinical consultation, please call [phone]. ====================================================================      ROS  Constitutional: Denies any fever or chills Gastrointestinal: No reported hemesis, hematochezia, vomiting, or acute GI distress Musculoskeletal: Denies any acute onset joint swelling, redness, loss of ROM, or  weakness Neurological: No reported episodes of acute onset apraxia, aphasia, dysarthria, agnosia, amnesia, paralysis, loss of coordination, or loss of consciousness  Medication Review  DULoxetine, FreeStyle Libre Sensor System, Magnesium, Vitamin D3, alendronate, amLODipine, baclofen, carvedilol, dicyclomine, hydrALAZINE, insulin glargine (2 Unit Dial), insulin lispro, lisinopril, metFORMIN, pantoprazole, polyethylene glycol, pravastatin, pregabalin, and sucralfate  History Review  Allergy: Ms. Saville is allergic to semaglutide. Drug: Ms. Clegg  reports no history of drug use. Alcohol:  reports that she does not currently use alcohol. Tobacco:  reports that she has never smoked. She has never used smokeless tobacco. Social: Ms. Sozio  reports that she has never smoked. She has never used smokeless tobacco. She reports that she does not currently use alcohol. She reports that she does not use drugs. Medical:  has a past medical history of Arthritis, Degenerative disc disease, lumbar, Diabetes mellitus without complication ([place]), GERD (gastroesophageal reflux disease), Hypertension, Neuropathy, Polyneuropathy, and Polyneuropathy. Surgical: Ms. Messing  has a past surgical history that includes arthroscopic rotator cuff; Colonoscopy; Abdominal hysterectomy; endosopic sinus; and Cataract extraction w/PHACO (Right, 11/25/2017). Family: family history includes Alcohol abuse in her father; Breast cancer in her maternal aunt; Cancer in her father.  Laboratory Chemistry Profile   Renal Lab Results  Component Value Date   BUN 13 12/23/2018   CREATININE 0.59 12/23/2018   BCR 17 10/12/2017   GFRAA >60 12/23/2018   GFRNONAA >60 12/23/2018    Hepatic Lab Results  Component Value Date   AST 17 10/21/2018   ALT 21 10/21/2018   ALBUMIN 3.6 10/21/2018   ALKPHOS 81 10/21/2018   LIPASE 20 10/21/2018    Electrolytes Lab Results  Component Value Date   NA 137 12/23/2018   K 4.1 12/23/2018   CL 100  12/23/2018   CALCIUM 9.8 12/23/2018   MG 2.0 10/12/2017    Bone Lab Results  Component Value Date   25OHVITD1 17 (L) 10/12/2017   25OHVITD2 <1.0 10/12/2017   25OHVITD3 17 10/12/2017    Inflammation (CRP: Acute Phase) (ESR: Chronic Phase) Lab Results  Component Value Date   CRP 4 10/12/2017   ESRSEDRATE 12 10/12/2017         Note: Above Lab results reviewed.  Recent Imaging Review  DG PAIN CLINIC C-ARM 1-60 MIN NO REPORT Fluoro was used, but no Radiologist interpretation will  be provided.  Please refer to "NOTES" tab for provider progress note. Note: Reviewed        Physical Exam  General appearance: Well nourished, well developed, and well hydrated. In no apparent acute distress Mental status: Alert, oriented x 3 (person, place, & time)       Respiratory: No evidence of acute respiratory distress Eyes: PERLA Vitals: BP (!) 146/82    Pulse 80    Temp 98.1 F (36.7 C)    Ht $R'4\' 11"'ca$  (1.499 m)    Wt 158 lb (71.7 kg)    SpO2 100%    BMI 31.91 kg/m  BMI: Estimated body mass index is 31.91 kg/m as calculated from the following:   Height as of this encounter: $RemoveBeforeD'4\' 11"'idTVFgzyDmSnNp$  (1.499 m).   Weight as of this encounter: 158 lb (71.7 kg). Ideal: Patient must be at least 60 in tall to calculate ideal body weight  Assessment   Status Diagnosis  Controlled Controlled Controlled 1. Chronic low back pain (3ry area of Pain) (Bilateral) (L>R) w/ sciatica (Left)   2. DM type 2 with diabetic peripheral neuropathy ([place])   3. 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 Pain medication agreement broken (12/06/2020)   5. Lumbar facet syndrome   6. Lumbar facet hypertrophy   7. Lumbar facet arthropathy   8. Chronic sacroiliac joint pain (Right)   9. Chronic pain syndrome   10. Pharmacologic therapy   11. Abnormal drug screen (09/03/2020)   12. History of illicit drug use (5/63/8756)   13. History of cocaine use (09/03/2020)      Updated Problems: No problems updated.   Plan of Care   Problem-specific:  No problem-specific Assessment & Plan notes found for this encounter.  Ms. GIANELLA CHISMAR has a current medication list which includes the following long-term medication(s): carvedilol, dicyclomine, duloxetine, hydralazine, toujeo max solostar, insulin lispro, lisinopril, pantoprazole, pravastatin, pregabalin, sucralfate, amlodipine, baclofen, vitamin d3, and magnesium.  Pharmacotherapy (Medications Ordered): No orders of the defined types were placed in this encounter.  Orders:  Orders Placed This Encounter  Procedures   LUMBAR FACET(MEDIAL BRANCH NERVE BLOCK) MBNB    Standing Status:   Future    Standing Expiration Date:   06/09/2021    Scheduling Instructions:     Procedure: Lumbar facet block (AKA.: Lumbosacral medial branch nerve block)     Side: Right-sided     Level: L3-4 & L5-S1 Facets (L2, L3, L4, L5, & S1 Medial Branch Nerves)     Sedation: Patient's choice.     Timeframe: ASAA    Order Specific Question:   Where will this procedure be performed?    Answer:   [facility]   DG Lumbar Spine Complete W/Bend    Patient presents with axial pain with possible radicular component. Please assist [place] in identifying specific level(s) and laterality of any additional findings such as: 1. Facet (Zygapophyseal) joint DJD (Hypertrophy, space narrowing, subchondral sclerosis, and/or osteophyte formation) 2. DDD and/or IVDD (Loss of disc height, desiccation, gas patterns, osteophytes, endplate sclerosis, or "Black disc disease") 3. Pars defects 4. Spondylolisthesis, spondylosis, and/or spondyloarthropathies (include Degree/Grade of displacement in mm) (stability) 5. Vertebral body Fractures (acute/chronic) (state percentage of collapse) 6. Demineralization (osteopenia/osteoporotic) 7. Bone pathology 8. Foraminal narrowing  9. Surgical changes    Standing Status:   Future    Standing Expiration Date:   04/08/2021    Scheduling Instructions:     Imaging must be  done as soon as possible.  Inform patient that order will expire within 30 days and I will not renew it.    Order Specific Question:   Reason for Exam (SYMPTOM  OR DIAGNOSIS REQUIRED)    Answer:   Low back pain    Order Specific Question:   Is patient pregnant?    Answer:   No    Order Specific Question:   Preferred imaging location?    Answer:   [facility]    Order Specific Question:   Call Results- Best Contact Number?    Answer:   [phone] ([facility])    Order Specific Question:   Radiology Contrast Protocol - do NOT remove file path    Answer:   [email or web address]    Order Specific Question:   Release to patient    Answer:   Immediate   DG Si Joints    Standing Status:   Future    Standing Expiration Date:   04/08/2021    Scheduling Instructions:     Imaging must be done as soon as possible. Inform patient that order will expire within 30 days and I will not renew it.    Order Specific Question:   Reason for Exam (SYMPTOM  OR DIAGNOSIS REQUIRED)    Answer:   Sacroiliac joint pain    Order Specific Question:   Is the patient pregnant?    Answer:   No    Order Specific Question:   Preferred imaging location?    Answer:   [facility]    Order Specific Question:   Call Results- Best Contact Number?    Answer:   [phone] ([facility])    Order Specific Question:   Release to patient    Answer:   Immediate   ToxASSURE Select 13 (MW), Urine    Volume: 30 ml(s). Minimum 3 ml of urine is needed. Document temperature of fresh sample. Indications: Long term (current) use of opiate analgesic (V76.160)    Order Specific Question:   Release to patient    Answer:   Immediate   Follow-up plan:   Return for (Clinic) procedure: (R) L-FCT Blk.     Interventional Therapies  Risk   Complexity Considerations:   Estimated body mass index is 31.91 kg/m as calculated from the following:   Height as of 12/27/20: $RemoveBef'4\' 11"'pfVPBixADW$   (1.499 m).   Weight as of 12/27/20: 158 lb (71.7 kg).   NOTE: UNCONTROLLED IDDM (NO STEROIDS)     Planned   Pending:   Diagnostic/therapeutic right lumbar facet MBB    Under consideration:   Possible bilateral lumbar facet RFA  Diagnostic bilateral genicular NB    Completed:   Diagnostic right lumbar facet MBB x2 (no steroid) (12/27/2020) (100/100/75/100)  Diagnostic left lumbar facet MBB x1 (w/ steroid) (10/09/2020) (100/100/90/L-100; R-50)  Therapeutic left L5 TFESI x4 (09/21/2018) (100/100/50/70)  Therapeutic left L4-5 LESI x4 (09/21/2018) (100/100/50/70)  Therapeutic left L5-S1 LESI x2 (02/25/2018) (100/100/100/100)  Therapeutic bilateral Hyalgan knee inj. x5 (02/25/2018) (100/100/100 x2 weeks/<50)  Therapeutic left steroid injection x1 (11/23/2017) (100/100/0/0)    Therapeutic   Palliative (PRN) options:   Palliative L5 TFESI  Palliative L4-5 LESI   Palliative L5-S1 LESI   Palliative Hyalgan knee inj.     Recent Visits Date Type Provider Dept  03/11/21 Office Visit Milinda Pointer, MD [facility]  01/15/21 Office Visit Milinda Pointer, MD [facility]  12/27/20 Procedure visit Milinda Pointer, MD [facility]  Showing recent visits within past 90 days and  meeting all other requirements Future Appointments No visits were found meeting these conditions. Showing future appointments within next 90 days and meeting all other requirements  I discussed the assessment and treatment plan with the patient. The patient was provided an opportunity to ask questions and all were answered. The patient agreed with the plan and demonstrated an understanding of the instructions.  Patient advised to call back or seek an in-person evaluation if the symptoms or condition worsens.  Duration of encounter: 30 minutes.  Note by: Gaspar Cola, MD Date: 03/11/2021; Time: 10:07 AM

## 2021-03-11 ENCOUNTER — Other Ambulatory Visit: Payer: Self-pay

## 2021-03-11 ENCOUNTER — Ambulatory Visit: Payer: Medicare Other | Attending: Pain Medicine | Admitting: Pain Medicine

## 2021-03-11 ENCOUNTER — Encounter: Payer: Self-pay | Admitting: Pain Medicine

## 2021-03-11 VITALS — BP 146/82 | HR 80 | Temp 98.1°F | Ht 59.0 in | Wt 158.0 lb

## 2021-03-11 DIAGNOSIS — Z79899 Other long term (current) drug therapy: Secondary | ICD-10-CM | POA: Diagnosis present

## 2021-03-11 DIAGNOSIS — M47816 Spondylosis without myelopathy or radiculopathy, lumbar region: Secondary | ICD-10-CM | POA: Diagnosis not present

## 2021-03-11 DIAGNOSIS — M533 Sacrococcygeal disorders, not elsewhere classified: Secondary | ICD-10-CM

## 2021-03-11 DIAGNOSIS — G8929 Other chronic pain: Secondary | ICD-10-CM | POA: Insufficient documentation

## 2021-03-11 DIAGNOSIS — M5442 Lumbago with sciatica, left side: Secondary | ICD-10-CM | POA: Insufficient documentation

## 2021-03-11 DIAGNOSIS — F1491 Cocaine use, unspecified, in remission: Secondary | ICD-10-CM | POA: Insufficient documentation

## 2021-03-11 DIAGNOSIS — G894 Chronic pain syndrome: Secondary | ICD-10-CM | POA: Diagnosis present

## 2021-03-11 DIAGNOSIS — F1991 Other psychoactive substance use, unspecified, in remission: Secondary | ICD-10-CM | POA: Insufficient documentation

## 2021-03-11 DIAGNOSIS — R892 Abnormal level of other drugs, medicaments and biological substances in specimens from other organs, systems and tissues: Secondary | ICD-10-CM | POA: Diagnosis present

## 2021-03-11 DIAGNOSIS — E1142 Type 2 diabetes mellitus with diabetic polyneuropathy: Secondary | ICD-10-CM

## 2021-03-11 DIAGNOSIS — Z9114 Patient's other noncompliance with medication regimen: Secondary | ICD-10-CM

## 2021-03-11 NOTE — Patient Instructions (Signed)
______________________________________________________________________  Preparing for Procedure with Sedation  NOTICE: Due to recent regulatory changes, starting on August 20, 2020, procedures requiring intravenous (IV) sedation will no longer be performed at the [facility].  These types of procedures are required to be performed at [facility] ambulatory surgery facility.  We are very sorry for the inconvenience.  Procedure appointments are limited to planned procedures: No Prescription Refills. No disability issues will be discussed. No medication changes will be discussed.  Instructions: Oral Intake: Do not eat or drink anything for at least 8 hours prior to your procedure. (Exception: Blood Pressure Medication. See below.) Transportation: A driver is required. You may not drive yourself after the procedure. Blood Pressure Medicine: Do not forget to take your blood pressure medicine with a sip of water the morning of the procedure. If your Diastolic (lower reading) is above 100 mmHg, elective cases will be cancelled/rescheduled. Blood thinners: These will need to be stopped for procedures. Notify our staff if you are taking any blood thinners. Depending on which one you take, there will be specific instructions on how and when to stop it. Diabetics on insulin: Notify the staff so that you can be scheduled 1st case in the morning. If your diabetes requires high dose insulin, take only  of your normal insulin dose the morning of the procedure and notify the staff that you have done so. Preventing infections: Shower with an antibacterial soap the morning of your procedure. Build-up your immune system: Take 1000 mg of Vitamin C with every meal (3 times a day) the day prior to your procedure. Antibiotics: Inform the staff if you have a condition or reason that requires you to take antibiotics before dental procedures. Pregnancy: If you are pregnant, call and cancel the procedure. Sickness: If  you have a cold, fever, or any active infections, call and cancel the procedure. Arrival: You must be in the facility at least 30 minutes prior to your scheduled procedure. Children: Do not bring children with you. Dress appropriately: Bring dark clothing that you would not mind if they get stained. Valuables: Do not bring any jewelry or valuables.  Reasons to call and reschedule or cancel your procedure: (Following these recommendations will minimize the risk of a serious complication.) Surgeries: Avoid having procedures within 2 weeks of any surgery. (Avoid for 2 weeks before or after any surgery). Flu Shots: Avoid having procedures within 2 weeks of a flu shots. (Avoid for 2 weeks before or after immunizations). Barium: Avoid having a procedure within 7-10 days after having had a radiological study involving the use of radiological contrast. (Myelograms, Barium swallow or enema study). Heart attacks: Avoid any elective procedures or surgeries for the initial 6 months after a "Myocardial Infarction" (Heart Attack). Blood thinners: It is imperative that you stop these medications before procedures. Let us know if you if you take any blood thinner.  Infection: Avoid procedures during or within two weeks of an infection (including chest colds or gastrointestinal problems). Symptoms associated with infections include: Localized redness, fever, chills, night sweats or profuse sweating, burning sensation when voiding, cough, congestion, stuffiness, runny nose, sore throat, diarrhea, nausea, vomiting, cold or Flu symptoms, recent or current infections. It is specially important if the infection is over the area that we intend to treat. Heart and lung problems: Symptoms that may suggest an active cardiopulmonary problem include: cough, chest pain, breathing difficulties or shortness of breath, dizziness, ankle swelling, uncontrolled high or unusually low blood pressure, and/or palpitations. If you are    experiencing any of these symptoms, cancel your procedure and contact your primary care physician for an evaluation.  Remember:  Regular Business hours are:  Monday to Thursday 8:00 AM to 4:00 PM  Provider's Schedule: Jackeline Gutknecht, MD:  Procedure days: Tuesday and Thursday 7:30 AM to 4:00 PM  Bilal Lateef, MD:  Procedure days: Monday and Wednesday 7:30 AM to 4:00 PM ______________________________________________________________________  ____________________________________________________________________________________________  General Risks and Possible Complications  Patient Responsibilities: It is important that you read this as it is part of your informed consent. It is our duty to inform you of the risks and possible complications associated with treatments offered to you. It is your responsibility as a patient to read this and to ask questions about anything that is not clear or that you believe was not covered in this document.  Patient's Rights: You have the right to refuse treatment. You also have the right to change your mind, even after initially having agreed to have the treatment done. However, under this last option, if you wait until the last second to change your mind, you may be charged for the materials used up to that point.  Introduction: Medicine is not an exact science. Everything in Medicine, including the lack of treatment(s), carries the potential for danger, harm, or loss (which is by definition: Risk). In Medicine, a complication is a secondary problem, condition, or disease that can aggravate an already existing one. All treatments carry the risk of possible complications. The fact that a side effects or complications occurs, does not imply that the treatment was conducted incorrectly. It must be clearly understood that these can happen even when everything is done following the highest safety standards.  No treatment: You can choose not to proceed with the  proposed treatment alternative. The "PRO(s)" would include: avoiding the risk of complications associated with the therapy. The "CON(s)" would include: not getting any of the treatment benefits. These benefits fall under one of three categories: diagnostic; therapeutic; and/or palliative. Diagnostic benefits include: getting information which can ultimately lead to improvement of the disease or symptom(s). Therapeutic benefits are those associated with the successful treatment of the disease. Finally, palliative benefits are those related to the decrease of the primary symptoms, without necessarily curing the condition (example: decreasing the pain from a flare-up of a chronic condition, such as incurable terminal cancer).  General Risks and Complications: These are associated to most interventional treatments. They can occur alone, or in combination. They fall under one of the following six (6) categories: no benefit or worsening of symptoms; bleeding; infection; nerve damage; allergic reactions; and/or death. No benefits or worsening of symptoms: In Medicine there are no guarantees, only probabilities. No healthcare provider can ever guarantee that a medical treatment will work, they can only state the probability that it may. Furthermore, there is always the possibility that the condition may worsen, either directly, or indirectly, as a consequence of the treatment. Bleeding: This is more common if the patient is taking a blood thinner, either prescription or over the counter (example: Goody Powders, Fish oil, Aspirin, Garlic, etc.), or if suffering a condition associated with impaired coagulation (example: Hemophilia, cirrhosis of the liver, low platelet counts, etc.). However, even if you do not have one on these, it can still happen. If you have any of these conditions, or take one of these drugs, make sure to notify your treating physician. Infection: This is more common in patients with a compromised  immune system, either due to disease (example:   diabetes, cancer, human immunodeficiency virus [HIV], etc.), or due to medications or treatments (example: therapies used to treat cancer and rheumatological diseases). However, even if you do not have one on these, it can still happen. If you have any of these conditions, or take one of these drugs, make sure to notify your treating physician. Nerve Damage: This is more common when the treatment is an invasive one, but it can also happen with the use of medications, such as those used in the treatment of cancer. The damage can occur to small secondary nerves, or to large primary ones, such as those in the spinal cord and brain. This damage may be temporary or permanent and it may lead to impairments that can range from temporary numbness to permanent paralysis and/or brain death. Allergic Reactions: Any time a substance or material comes in contact with our body, there is the possibility of an allergic reaction. These can range from a mild skin rash (contact dermatitis) to a severe systemic reaction (anaphylactic reaction), which can result in death. Death: In general, any medical intervention can result in death, most of the time due to an unforeseen complication. ____________________________________________________________________________________________  

## 2021-03-11 NOTE — Progress Notes (Signed)
Safety precautions to be maintained throughout the outpatient stay will include: orient to surroundings, keep bed in low position, maintain call bell within reach at all times, provide assistance with transfer out of bed and ambulation.  

## 2021-03-12 ENCOUNTER — Other Ambulatory Visit: Payer: Self-pay | Admitting: Internal Medicine

## 2021-03-12 DIAGNOSIS — M25552 Pain in left hip: Secondary | ICD-10-CM

## 2021-03-12 DIAGNOSIS — R1032 Left lower quadrant pain: Secondary | ICD-10-CM

## 2021-03-12 NOTE — Addendum Note (Signed)
Addended by: Milinda Pointer A on: 03/12/2021 10:07 AM   Modules accepted: Orders

## 2021-03-14 ENCOUNTER — Ambulatory Visit
Admission: RE | Admit: 2021-03-14 | Discharge: 2021-03-14 | Disposition: A | Discharge status: Home / Self Care | Payer: Medicare Other | Attending: Pain Medicine | Admitting: Pain Medicine

## 2021-03-14 ENCOUNTER — Ambulatory Visit
Admission: RE | Admit: 2021-03-14 | Discharge: 2021-03-14 | Disposition: A | Discharge status: Home / Self Care | Payer: Medicare Other | Source: Ambulatory Visit | Attending: Pain Medicine | Admitting: Pain Medicine

## 2021-03-14 DIAGNOSIS — G8929 Other chronic pain: Secondary | ICD-10-CM

## 2021-03-14 DIAGNOSIS — M533 Sacrococcygeal disorders, not elsewhere classified: Secondary | ICD-10-CM | POA: Insufficient documentation

## 2021-03-14 DIAGNOSIS — M5442 Lumbago with sciatica, left side: Secondary | ICD-10-CM | POA: Diagnosis present

## 2021-03-14 DIAGNOSIS — M47816 Spondylosis without myelopathy or radiculopathy, lumbar region: Secondary | ICD-10-CM

## 2021-03-15 LAB — TOXASSURE SELECT 13 (MW), URINE

## 2021-03-18 ENCOUNTER — Ambulatory Visit
Admission: RE | Admit: 2021-03-18 | Discharge: 2021-03-18 | Disposition: A | Discharge status: Home / Self Care | Payer: Medicare Other | Source: Ambulatory Visit | Attending: Internal Medicine | Admitting: Internal Medicine

## 2021-03-18 ENCOUNTER — Other Ambulatory Visit: Payer: Self-pay

## 2021-03-18 DIAGNOSIS — M25552 Pain in left hip: Secondary | ICD-10-CM | POA: Insufficient documentation

## 2021-03-18 DIAGNOSIS — R1032 Left lower quadrant pain: Secondary | ICD-10-CM | POA: Insufficient documentation

## 2021-03-18 MED ORDER — IOHEXOL 300 MG/ML  SOLN
100.0000 mL | Freq: Once | INTRAMUSCULAR | Status: AC | PRN
  Administered 2021-03-18: 100 mL via INTRAVENOUS

## 2021-03-26 ENCOUNTER — Encounter: Payer: Self-pay | Admitting: Pain Medicine

## 2021-03-26 ENCOUNTER — Other Ambulatory Visit: Payer: Self-pay

## 2021-03-26 ENCOUNTER — Ambulatory Visit (HOSPITAL_BASED_OUTPATIENT_CLINIC_OR_DEPARTMENT_OTHER): Payer: Medicare Other | Admitting: Pain Medicine

## 2021-03-26 ENCOUNTER — Ambulatory Visit
Admission: RE | Admit: 2021-03-26 | Discharge: 2021-03-26 | Disposition: A | Discharge status: Home / Self Care | Payer: Medicare Other | Source: Ambulatory Visit | Attending: Pain Medicine | Admitting: Pain Medicine

## 2021-03-26 VITALS — BP 166/86 | HR 89 | Temp 97.4°F | Resp 16 | Ht 59.0 in | Wt 150.0 lb

## 2021-03-26 DIAGNOSIS — M545 Low back pain, unspecified: Secondary | ICD-10-CM

## 2021-03-26 DIAGNOSIS — G8929 Other chronic pain: Secondary | ICD-10-CM

## 2021-03-26 DIAGNOSIS — M4316 Spondylolisthesis, lumbar region: Secondary | ICD-10-CM

## 2021-03-26 DIAGNOSIS — M5442 Lumbago with sciatica, left side: Secondary | ICD-10-CM | POA: Diagnosis present

## 2021-03-26 DIAGNOSIS — M47816 Spondylosis without myelopathy or radiculopathy, lumbar region: Secondary | ICD-10-CM

## 2021-03-26 DIAGNOSIS — M47817 Spondylosis without myelopathy or radiculopathy, lumbosacral region: Secondary | ICD-10-CM

## 2021-03-26 DIAGNOSIS — M5136 Other intervertebral disc degeneration, lumbar region: Secondary | ICD-10-CM | POA: Diagnosis present

## 2021-03-26 MED ORDER — ROPIVACAINE HCL 2 MG/ML IJ SOLN
9.0000 mL | Freq: Once | INTRAMUSCULAR | Status: AC
  Administered 2021-03-26: 9 mL via PERINEURAL
  Filled 2021-03-26: qty 20

## 2021-03-26 MED ORDER — PENTAFLUOROPROP-TETRAFLUOROETH EX AERO
INHALATION_SPRAY | Freq: Once | CUTANEOUS | Status: DC
  Filled 2021-03-26: qty 116

## 2021-03-26 MED ORDER — LACTATED RINGERS IV SOLN
1000.0000 mL | Freq: Once | INTRAVENOUS | Status: AC
  Administered 2021-03-26: 1000 mL via INTRAVENOUS

## 2021-03-26 MED ORDER — LIDOCAINE HCL 2 % IJ SOLN
20.0000 mL | Freq: Once | INTRAMUSCULAR | Status: AC
  Administered 2021-03-26: 400 mg
  Filled 2021-03-26: qty 20

## 2021-03-26 MED ORDER — MIDAZOLAM HCL 5 MG/5ML IJ SOLN
0.5000 mg | Freq: Once | INTRAMUSCULAR | Status: AC
  Administered 2021-03-26: 2 mg via INTRAVENOUS
  Filled 2021-03-26: qty 5

## 2021-03-26 NOTE — Patient Instructions (Addendum)
____________________________________________________________________________________________  Post-Procedure Discharge Instructions  Instructions: Apply ice:  Purpose: This will minimize any swelling and discomfort after procedure.  When: Day of procedure, as soon as you get home. How: Fill a plastic sandwich bag with crushed ice. Cover it with a small towel and apply to injection site. How long: (15 min on, 15 min off) Apply for 15 minutes then remove x 15 minutes.  Repeat sequence on day of procedure, until you go to bed. Apply heat:  Purpose: To treat any soreness and discomfort from the procedure. When: Starting the next day after the procedure. How: Apply heat to procedure site starting the day following the procedure. How long: May continue to repeat daily, until discomfort goes away. Food intake: Start with clear liquids (like water) and advance to regular food, as tolerated.  Physical activities: Keep activities to a minimum for the first 8 hours after the procedure. After that, then as tolerated. Driving: If you have received any sedation, be responsible and do not drive. You are not allowed to drive for 24 hours after having sedation. Blood thinner: (Applies only to those taking blood thinners) You may restart your blood thinner 6 hours after your procedure. Insulin: (Applies only to Diabetic patients taking insulin) As soon as you can eat, you may resume your normal dosing schedule. Infection prevention: Keep procedure site clean and dry. Shower daily and clean area with soap and water. Post-procedure Pain Diary: Extremely important that this be done correctly and accurately. Recorded information will be used to determine the next step in treatment. For the purpose of accuracy, follow these rules: Evaluate only the area treated. Do not report or include pain from an untreated area. For the purpose of this evaluation, ignore all other areas of pain, except for the treated area. After  your procedure, avoid taking a long nap and attempting to complete the pain diary after you wake up. Instead, set your alarm clock to go off every hour, on the hour, for the initial 8 hours after the procedure. Document the duration of the numbing medicine, and the relief you are getting from it. Do not go to sleep and attempt to complete it later. It will not be accurate. If you received sedation, it is likely that you were given a medication that may cause amnesia. Because of this, completing the diary at a later time may cause the information to be inaccurate. This information is needed to plan your care. Follow-up appointment: Keep your post-procedure follow-up evaluation appointment after the procedure (usually 2 weeks for most procedures, 6 weeks for radiofrequencies). DO NOT FORGET to bring you pain diary with you.   Expect: (What should I expect to see with my procedure?) From numbing medicine (AKA: Local Anesthetics): Numbness or decrease in pain. You may also experience some weakness, which if present, could last for the duration of the local anesthetic. Onset: Full effect within 15 minutes of injected. Duration: It will depend on the type of local anesthetic used. On the average, 1 to 8 hours.  From procedure: Some discomfort is to be expected once the numbing medicine wears off. This should be minimal if ice and heat are applied as instructed.  Call if: (When should I call?) You experience numbness and weakness that gets worse with time, as opposed to wearing off. New onset bowel or bladder incontinence. (Applies only to procedures done in the spine)  Emergency Numbers: Durning business hours (Monday - Thursday, 8:00 AM - 4:00 PM) (Friday, 9:00 AM -  12:00 Noon): [phone] After hours: [phone] NOTE: If you are having a problem and are unable connect with, or to talk to a provider, then go to your nearest urgent care or emergency department. If the problem is serious and  urgent, please call 911. ____________________________________________________________________________________________  Pain Management Discharge Instructions  General Discharge Instructions :  If you need to reach your doctor call: Monday-Friday 8:00 am - 4:00 pm at [phone] or toll free [phone].  After clinic hours [phone] to have operator reach doctor.  Bring all of your medication bottles to all your appointments in the pain clinic.  To cancel or reschedule your appointment with Pain Management please remember to call 24 hours in advance to avoid a fee.  Refer to the educational materials which you have been given on: General Risks, I had my Procedure. Discharge Instructions, Post Sedation.  Post Procedure Instructions:  The drugs you were given will stay in your system until tomorrow, so for the next 24 hours you should not drive, make any legal decisions or drink any alcoholic beverages.  You may eat anything you prefer, but it is better to start with liquids then soups and crackers, and gradually work up to solid foods.  Please notify your doctor immediately if you have any unusual bleeding, trouble breathing or pain that is not related to your normal pain.  Depending on the type of procedure that was done, some parts of your body may feel week and/or numb.  This usually clears up by tonight or the next day.  Walk with the use of an assistive device or accompanied by an adult for the 24 hours.  You may use ice on the affected area for the first 24 hours.  Put ice in a Ziploc bag and cover with a towel and place against area 15 minutes on 15 minutes off.  You may switch to heat after 24 hours.Facet Blocks Patient Information  Description: The facets are joints in the spine between the vertebrae.  Like any joints in the body, facets can become irritated and painful.  Arthritis can also effect the facets.  By injecting steroids and local anesthetic in and around these  joints, we can temporarily block the nerve supply to them.  Steroids act directly on irritated nerves and tissues to reduce selling and inflammation which often leads to decreased pain.  Facet blocks may be done anywhere along the spine from the neck to the low back depending upon the location of your pain.   After numbing the skin with local anesthetic (like Novocaine), a small needle is passed onto the facet joints under x-ray guidance.  You may experience a sensation of pressure while this is being done.  The entire block usually lasts about 15-25 minutes.   Conditions which may be treated by facet blocks:  Low back/buttock pain Neck/shoulder pain Certain types of headaches  Preparation for the injection:  Do not eat any solid food or dairy products within 8 hours of your appointment. You may drink clear liquid up to 3 hours before appointment.  Clear liquids include water, black coffee, juice or soda.  No milk or cream please. You may take your regular medication, including pain medications, with a sip of water before your appointment.  Diabetics should hold regular insulin (if taken separately) and take 1/2 normal NPH dose the morning of the procedure.  Carry some sugar containing items with you to your appointment. A driver must accompany you and be prepared to drive  you home after your procedure. Bring all your current medications with you. An IV may be inserted and sedation may be given at the discretion of the physician. A blood pressure cuff, EKG and other monitors will often be applied during the procedure.  Some patients may need to have extra oxygen administered for a short period. You will be asked to provide medical information, including your allergies and medications, prior to the procedure.  We must know immediately if you are taking blood thinners (like Coumadin/Warfarin) or if you are allergic to IV iodine contrast (dye).  We must know if you could possible be  pregnant.  Possible side-effects:  Bleeding from needle site Infection (rare, may require surgery) Nerve injury (rare) Numbness & tingling (temporary) Difficulty urinating (rare, temporary) Spinal headache (a headache worse with upright posture) Light-headedness (temporary) Pain at injection site (serveral days) Decreased blood pressure (rare, temporary) Weakness in arm/leg (temporary) Pressure sensation in back/neck (temporary)   Call if you experience:  Fever/chills associated with headache or increased back/neck pain Headache worsened by an upright position New onset, weakness or numbness of an extremity below the injection site Hives or difficulty breathing (go to the emergency room) Inflammation or drainage at the injection site(s) Severe back/neck pain greater than usual New symptoms which are concerning to you  Please note:  Although the local anesthetic injected can often make your back or neck feel good for several hours after the injection, the pain will likely return. It takes 3-7 days for steroids to work.  You may not notice any pain relief for at least one week.  If effective, we will often do a series of 2-3 injections spaced 3-6 weeks apart to maximally decrease your pain.  After the initial series, you may be a candidate for a more permanent nerve block of the facets.  If you have any questions, please call #[phone]) [facility]

## 2021-03-26 NOTE — Progress Notes (Signed)
PROVIDER NOTE: Interpretation of information contained herein should be left to medically-trained personnel. Specific patient instructions are provided elsewhere under "Patient Instructions" section of medical record. This document was created in part using STT-dictation technology, any transcriptional errors that may result from this process are unintentional.  Patient: Charlene [name] Type: Established DOB: [birth year] MRN: [id number] PCP: Tracie Harrier, MD  Service: Procedure DOS: 03/26/2021 Setting: Ambulatory Location: Ambulatory outpatient facility Delivery: Face-to-face Provider: Gaspar Cola, MD Specialty: Interventional Pain Management Specialty designation: 09 Location: Outpatient facility Ref. Prov.: Milinda Pointer, MD    Primary Reason for Visit: Interventional Pain Management Treatment. CC: Back Pain (lower)   Procedure:           Type: Lumbar Facet, Medial Branch Block(s) #3 (NO STEROIDS) Laterality: Right  Level: L2, L3, L4, L5, & S1 Medial Branch Level(s). Injecting these levels blocks the L3-4 and L5-S1 lumbar facet joints. Imaging: Fluoroscopic guidance Anesthesia: Local anesthesia (1-2% Lidocaine) Anxiolysis: IV Versed         Sedation: None. DOS: 03/26/2021 Performed by: Gaspar Cola, MD  Primary Purpose: Diagnostic/Therapeutic Indications: Low back pain severe enough to impact quality of life or function. 1. Lumbar facet syndrome   2. Lumbar facet hypertrophy   3. Spondylosis without myelopathy or radiculopathy, lumbosacral region   4. 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 Lumbar facet arthropathy   6. DDD (degenerative disc disease), lumbar   7. Chronic low back pain (Bilateral) w/o sciatica    NAS-11 Pain score:   Pre-procedure: 1 /10   Post-procedure: 0-No pain/10   The patient indicates that the day she is having pain on both sides of her lower back, but since she was approved only to do the right side, we will be  doing just that side.     Position / Prep / Materials:  Position: Prone  Prep solution: DuraPrep (Iodine Povacrylex [0.7% available iodine] and Isopropyl Alcohol, 74% w/w) Area Prepped: Posterolateral Lumbosacral Spine (Wide prep: From the lower border of the scapula down to the end of the tailbone and from flank to flank.)  Materials:  Tray: Block Needle(s):  Type: Spinal  Gauge (G): 22  Length: 3.5-in Qty: 4  Pre-op H&P Assessment:  Charlene [name] is a 61 y.o. (year old), female patient, seen today for interventional treatment. She  has a past surgical history that includes arthroscopic rotator cuff; Colonoscopy; Abdominal hysterectomy; endosopic sinus; and Cataract extraction w/PHACO (Right, 11/25/2017). Charlene [name] has a current medication list which includes the following prescription(s): carvedilol, freestyle libre sensor system, dicyclomine, duloxetine, hydralazine, toujeo max solostar, insulin lispro, lisinopril, metformin, pantoprazole, polyethylene glycol, pravastatin, pregabalin, sucralfate, amlodipine, baclofen, vitamin d3, and magnesium, and the following Facility-Administered Medications: pentafluoroprop-tetrafluoroeth. Her primarily concern today is the Back Pain (lower)  Initial Vital Signs:  Pulse/HCG Rate: 89ECG Heart Rate: 88 Temp: (!) 97.4 F (36.3 C) Resp: 14 BP: (!) 144/83 SpO2: 99 %  BMI: Estimated body mass index is 30.3 kg/m as calculated from the following:   Height as of this encounter: '4\' 11"'$  (1.499 m).   Weight as of this encounter: 150 lb (68 kg).  Risk Assessment: Allergies: Reviewed. She is allergic to semaglutide.  Allergy Precautions: None required Coagulopathies: Reviewed. None identified.  Blood-thinner therapy: None at this time Active Infection(s): Reviewed. None identified. Charlene [name] is afebrile  Site Confirmation: Charlene [name] was asked to confirm the procedure and laterality before marking the site Procedure checklist: Completed Consent:  Before the procedure and under the  influence of no sedative(s), amnesic(s), or anxiolytics, the patient was informed of the treatment options, risks and possible complications. To fulfill our ethical and legal obligations, as recommended by the American Medical Association's Code of Ethics, I have informed the patient of my clinical impression; the nature and purpose of the treatment or procedure; the risks, benefits, and possible complications of the intervention; the alternatives, including doing nothing; the risk(s) and benefit(s) of the alternative treatment(s) or procedure(s); and the risk(s) and benefit(s) of doing nothing. The patient was provided information about the general risks and possible complications associated with the procedure. These may include, but are not limited to: failure to achieve desired goals, infection, bleeding, organ or nerve damage, allergic reactions, paralysis, and death. In addition, the patient was informed of those risks and complications associated to Spine-related procedures, such as failure to decrease pain; infection (i.e.: Meningitis, epidural or intraspinal abscess); bleeding (i.e.: epidural hematoma, subarachnoid hemorrhage, or any other type of intraspinal or peri-dural bleeding); organ or nerve damage (i.e.: Any type of peripheral nerve, nerve root, or spinal cord injury) with subsequent damage to sensory, motor, and/or autonomic systems, resulting in permanent pain, numbness, and/or weakness of one or several areas of the body; allergic reactions; (i.e.: anaphylactic reaction); and/or death. Furthermore, the patient was informed of those risks and complications associated with the medications. These include, but are not limited to: allergic reactions (i.e.: anaphylactic or anaphylactoid reaction(s)); adrenal axis suppression; blood sugar elevation that in diabetics may result in ketoacidosis or comma; water retention that in patients with history of congestive heart  failure may result in shortness of breath, pulmonary edema, and decompensation with resultant heart failure; weight gain; swelling or edema; medication-induced neural toxicity; particulate matter embolism and blood vessel occlusion with resultant organ, and/or nervous system infarction; and/or aseptic necrosis of one or more joints. Finally, the patient was informed that Medicine is not an exact science; therefore, there is also the possibility of unforeseen or unpredictable risks and/or possible complications that may result in a catastrophic outcome. The patient indicated having understood very clearly. We have given the patient no guarantees and we have made no promises. Enough time was given to the patient to ask questions, all of which were answered to the patient's satisfaction. Ms. Hammitt has indicated that she wanted to continue with the procedure. Attestation: I, the ordering provider, attest that I have discussed with the patient the benefits, risks, side-effects, alternatives, likelihood of achieving goals, and potential problems during recovery for the procedure that I have provided informed consent. Date   Time: 03/26/2021  9:35 AM  Pre-Procedure Preparation:  Monitoring: As per clinic protocol. Respiration, ETCO2, SpO2, BP, heart rate and rhythm monitor placed and checked for adequate function Safety Precautions: Patient was assessed for positional comfort and pressure points before starting the procedure. Time-out: I initiated and conducted the "Time-out" before starting the procedure, as per protocol. The patient was asked to participate by confirming the accuracy of the "Time Out" information. Verification of the correct person, site, and procedure were performed and confirmed by me, the nursing staff, and the patient. "Time-out" conducted as per Joint Commission's Universal Protocol (UP.01.01.01). Time: 1011  Description of Procedure:          Laterality: Right Targeted Levels:  L2, L3,  L4, L5, & S1 Medial Branch Level(s)  Safety Precautions: Aspiration looking for blood return was conducted prior to all injections. At no point did we inject any substances, as a needle was being advanced. Before injecting,  the patient was told to immediately notify me if she was experiencing any new onset of "ringing in the ears, or metallic taste in the mouth". No attempts were made at seeking any paresthesias. Safe injection practices and needle disposal techniques used. Medications properly checked for expiration dates. SDV (single dose vial) medications used. After the completion of the procedure, all disposable equipment used was discarded in the proper designated medical waste containers. Local Anesthesia: Protocol guidelines were followed. The patient was positioned over the fluoroscopy table. The area was prepped in the usual manner. The time-out was completed. The target area was identified using fluoroscopy. A 12-in long, straight, sterile hemostat was used with fluoroscopic guidance to locate the targets for each level blocked. Once located, the skin was marked with an approved surgical skin marker. Once all sites were marked, the skin (epidermis, dermis, and hypodermis), as well as deeper tissues (fat, connective tissue and muscle) were infiltrated with a small amount of a short-acting local anesthetic, loaded on a 10cc syringe with a 25G, 1.5-in  Needle. An appropriate amount of time was allowed for local anesthetics to take effect before proceeding to the next step. Local Anesthetic: Lidocaine 2.0% The unused portion of the local anesthetic was discarded in the proper designated containers. Technical description of process:  L2 Medial Branch Nerve Block (MBB): The target area for the L2 medial branch is at the junction of the postero-lateral aspect of the superior articular process and the superior, posterior, and medial edge of the transverse process of L3. Under fluoroscopic guidance, a  Quincke needle was inserted until contact was made with os over the superior postero-lateral aspect of the pedicular shadow (target area). After negative aspiration for blood, 0.5 mL of the nerve block solution was injected without difficulty or complication. The needle was removed intact. L3 Medial Branch Nerve Block (MBB): The target area for the L3 medial branch is at the junction of the postero-lateral aspect of the superior articular process and the superior, posterior, and medial edge of the transverse process of L4. Under fluoroscopic guidance, a Quincke needle was inserted until contact was made with os over the superior postero-lateral aspect of the pedicular shadow (target area). After negative aspiration for blood, 0.5 mL of the nerve block solution was injected without difficulty or complication. The needle was removed intact. L4 Medial Branch Nerve Block (MBB): The target area for the L4 medial branch is at the junction of the postero-lateral aspect of the superior articular process and the superior, posterior, and medial edge of the transverse process of L5. Under fluoroscopic guidance, a Quincke needle was inserted until contact was made with os over the superior postero-lateral aspect of the pedicular shadow (target area). After negative aspiration for blood, 0.5 mL of the nerve block solution was injected without difficulty or complication. The needle was removed intact. L5 Medial Branch Nerve Block (MBB): The target area for the L5 medial branch is at the junction of the postero-lateral aspect of the superior articular process and the superior, posterior, and medial edge of the sacral ala. Under fluoroscopic guidance, a Quincke needle was inserted until contact was made with os over the superior postero-lateral aspect of the pedicular shadow (target area). After negative aspiration for blood, 0.5 mL of the nerve block solution was injected without difficulty or complication. The needle was  removed intact. S1 Medial Branch Nerve Block (MBB): The target area for the S1 medial branch is at the posterior and inferior 6 o'clock position of the L5-S1  facet joint. Under fluoroscopic guidance, the Quincke needle inserted for the L5 MBB was redirected until contact was made with os over the inferior and postero aspect of the sacrum, at the 6 o' clock position under the L5-S1 facet joint (Target area). After negative aspiration for blood, 0.5 mL of the nerve block solution was injected without difficulty or complication. The needle was removed intact.  Once the entire procedure was completed, the treated area was cleaned, making sure to leave some of the prepping solution back to take advantage of its long term bactericidal properties.      Illustration of the posterior view of the lumbar spine and the posterior neural structures. Laminae of L2 through S1 are labeled. DPRL5, dorsal primary ramus of L5; DPRS1, dorsal primary ramus of S1; DPR3, dorsal primary ramus of L3; FJ, facet (zygapophyseal) joint L3-L4; I, inferior articular process of L4; LB1, lateral branch of dorsal primary ramus of L1; IAB, inferior articular branches from L3 medial branch (supplies L4-L5 facet joint); IBP, intermediate branch plexus; MB3, medial branch of dorsal primary ramus of L3; NR3, third lumbar nerve root; S, superior articular process of L5; SAB, superior articular branches from L4 (supplies L4-5 facet joint also); TP3, transverse process of L3.  Vitals:   03/26/21 1000 03/26/21 1011 03/26/21 1016 03/26/21 1024  BP: (!) 195/108 (!) 189/106 (!) 179/101 (!) 166/86  Pulse:      Resp: '12 14 15 16  '$ Temp:      TempSrc:      SpO2: 97% 96% 97% 98%  Weight:      Height:         Start Time: 1011 hrs. End Time: 1015 hrs.  Imaging Guidance (Spinal):          Type of Imaging Technique: Fluoroscopy Guidance (Spinal) Indication(s): Assistance in needle guidance and placement for procedures requiring needle  placement in or near specific anatomical locations not easily accessible without such assistance. Exposure Time: Please see nurses notes. Contrast: None used. Fluoroscopic Guidance: I was personally present during the use of fluoroscopy. "Tunnel Vision Technique" used to obtain the best possible view of the target area. Parallax error corrected before commencing the procedure. "Direction-depth-direction" technique used to introduce the needle under continuous pulsed fluoroscopy. Once target was reached, antero-posterior, oblique, and lateral fluoroscopic projection used confirm needle placement in all planes. Images permanently stored in EMR. Interpretation: No contrast injected. I personally interpreted the imaging intraoperatively. Adequate needle placement confirmed in multiple planes. Permanent images saved into the patient's record.  Antibiotic Prophylaxis:   Anti-infectives (From admission, onward)    None      Indication(s): None identified  Post-operative Assessment:  Post-procedure Vital Signs:  Pulse/HCG Rate: 8987 Temp: (!) 97.4 F (36.3 C) Resp: 16 BP: (!) 166/86 SpO2: 98 %  EBL: None  Complications: No immediate post-treatment complications observed by team, or reported by patient.  Note: The patient tolerated the entire procedure well. A repeat set of vitals were taken after the procedure and the patient was kept under observation following institutional policy, for this type of procedure. Post-procedural neurological assessment was performed, showing return to baseline, prior to discharge. The patient was provided with post-procedure discharge instructions, including a section on how to identify potential problems. Should any problems arise concerning this procedure, the patient was given instructions to immediately contact us, at any time, without hesitation. In any case, we plan to contact the patient by telephone for a follow-up status report regarding this  interventional procedure.  Comments:  No additional relevant information.  Plan of Care  Orders:  Orders Placed This Encounter  Procedures   LUMBAR FACET(MEDIAL BRANCH NERVE BLOCK) MBNB    Scheduling Instructions:     Procedure: Lumbar facet block (AKA.: Lumbosacral medial branch nerve block)     Side: Right-sided     Level: L3-4 & L5-S1 Facets (L2, L3, L4, L5, & S1 Medial Branch Nerves)     Sedation: Patient's choice.     Timeframe: Today    Order Specific Question:   Where will this procedure be performed?    Answer:   [facility]   DG [facility] C-ARM 1-60 MIN NO REPORT    Intraoperative interpretation by procedural physician at [facility].    Standing Status:   Standing    Number of Occurrences:   1    Order Specific Question:   Reason for exam:    Answer:   Assistance in needle guidance and placement for procedures requiring needle placement in or near specific anatomical locations not easily accessible without such assistance.   Informed Consent Details: Physician/Practitioner Attestation; Transcribe to consent form and obtain patient signature    Nursing Order: Transcribe to consent form and obtain patient signature. Note: Always confirm laterality of pain with [name], before procedure.    Order Specific Question:   Physician/Practitioner attestation of informed consent for procedure/surgical case    Answer:   I, the physician/practitioner, attest that I have discussed with the patient the benefits, risks, side effects, alternatives, likelihood of achieving goals and potential problems during recovery for the procedure that I have provided informed consent.    Order Specific Question:   Procedure    Answer:   Lumbar Facet Block  under fluoroscopic guidance    Order Specific Question:   Physician/Practitioner performing the procedure    Answer:   Camara Renstrom A. Dossie Arbour MD    Order Specific Question:   Indication/Reason    Answer:   Low Back Pain, with  our without leg pain, due to Facet Joint Arthralgia (Joint Pain) Spondylosis (Arthritis of the Spine), without myelopathy or radiculopathy (Nerve Damage).   Provide equipment / supplies at bedside    "Block Tray" (Disposable   single use) Needle type: SpinalSpinal Amount/quantity: 4 Size: Regular (3.5-inch) Gauge: 22G    Standing Status:   Standing    Number of Occurrences:   1    Order Specific Question:   Specify    Answer:   Block Tray   Chronic Opioid Analgesic:  No chronic opioid analgesics therapy prescribed by our practice. ABNORMAL UDS (09/03/2020) (+) for COCAINE.   Medications ordered for procedure: Meds ordered this encounter  Medications   lidocaine (XYLOCAINE) 2 % (with pres) injection 400 mg   pentafluoroprop-tetrafluoroeth (GEBAUERS) aerosol   lactated ringers infusion 1,000 mL   midazolam (VERSED) 5 MG/5ML injection 0.5-2 mg    Make sure Flumazenil is available in the pyxis when using this medication. If oversedation occurs, administer 0.2 mg IV over 15 sec. If after 45 sec no response, administer 0.2 mg again over 1 min; may repeat at 1 min intervals; not to exceed 4 doses (1 mg)   ropivacaine (PF) 2 mg/mL (0.2%) (NAROPIN) injection 9 mL   Medications administered: We administered lidocaine, lactated ringers, midazolam, and ropivacaine (PF) 2 mg/mL (0.2%).  See the medical record for exact dosing, route, and time of administration.  Follow-up plan:   Return in about 2 weeks (around 04/09/2021) for Proc-day (T,Th), (  F2F), (PPE).       Interventional Therapies  Risk   Complexity Considerations:   Estimated body mass index is 31.91 kg/m as calculated from the following:   Height as of 12/27/20: '4\' 11"'$  (1.499 m).   Weight as of 12/27/20: 158 lb (71.7 kg).   NOTE: UNCONTROLLED IDDM (NO STEROIDS)     Planned   Pending:   Diagnostic/therapeutic right lumbar facet MBB #3 (no steroid) (03/26/2021)    Under consideration:   Possible bilateral lumbar facet RFA   Diagnostic bilateral genicular NB    Completed:   Diagnostic right lumbar facet MBB x2 (no steroid) (12/27/2020) (100/100/75/100)  Diagnostic left lumbar facet MBB x1 (w/ steroid) (10/09/2020) (100/100/90/L-100; R-50)  Therapeutic left L5 TFESI x4 (09/21/2018) (100/100/50/70)  Therapeutic left L4-5 LESI x4 (09/21/2018) (100/100/50/70)  Therapeutic left L5-S1 LESI x2 (02/25/2018) (100/100/100/100)  Therapeutic bilateral Hyalgan knee inj. x5 (02/25/2018) (100/100/100 x2 weeks/<50)  Therapeutic left IA steroid knee injection x1 (11/23/2017) (100/100/0/0)    Therapeutic   Palliative (PRN) options:   Palliative L5 TFESI  Palliative L4-5 LESI   Palliative L5-S1 LESI   Palliative Hyalgan knee inj.    Recent Visits Date Type Provider Dept  03/11/21 Office Visit Milinda Pointer, MD [facility]  01/15/21 Office Visit Milinda Pointer, MD [facility]  12/27/20 Procedure visit Milinda Pointer, MD [facility]  Showing recent visits within past 90 days and meeting all other requirements Today's Visits Date Type Provider Dept  03/26/21 Procedure visit Milinda Pointer, MD [facility]  Showing today's visits and meeting all other requirements Future Appointments Date Type Provider Dept  04/16/21 Appointment Milinda Pointer, MD [facility]  Showing future appointments within next 90 days and meeting all other requirements  Disposition: Discharge home  Discharge (Date   Time): 03/26/2021; 1030 hrs.   Primary Care Physician: Tracie Harrier, MD Location: [facility] Note by: Gaspar Cola, MD Date: 03/26/2021; Time: 10:41 AM  Disclaimer:  Medicine is not an Chief Strategy Officer. The only guarantee in medicine is that nothing is guaranteed. It is important to note that the decision to proceed with this intervention was based on the information collected from the patient. The Data and conclusions were drawn from  the patient's questionnaire, the interview, and the physical examination. Because the information was provided in large part by the patient, it cannot be guaranteed that it has not been purposely or unconsciously manipulated. Every effort has been made to obtain as much relevant data as possible for this evaluation. It is important to note that the conclusions that lead to this procedure are derived in large part from the available data. Always take into account that the treatment will also be dependent on availability of resources and existing treatment guidelines, considered by other Pain Management Practitioners as being common knowledge and practice, at the time of the intervention. For Medico-Legal purposes, it is also important to point out that variation in procedural techniques and pharmacological choices are the acceptable norm. The indications, contraindications, technique, and results of the above procedure should only be interpreted and judged by a Board-Certified Interventional Pain Specialist with extensive familiarity and expertise in the same exact procedure and technique.

## 2021-03-27 ENCOUNTER — Telehealth: Payer: Self-pay | Admitting: *Deleted

## 2021-03-27 NOTE — Telephone Encounter (Signed)
Attempted to call for post procedure follow-up. Message left. 

## 2021-04-16 ENCOUNTER — Other Ambulatory Visit: Payer: Self-pay

## 2021-04-16 ENCOUNTER — Encounter: Payer: Self-pay | Admitting: Pain Medicine

## 2021-04-16 ENCOUNTER — Ambulatory Visit: Payer: Medicare Other | Attending: Pain Medicine | Admitting: Pain Medicine

## 2021-04-16 VITALS — BP 182/98 | HR 99 | Temp 97.3°F | Resp 16 | Ht 59.0 in | Wt 158.0 lb

## 2021-04-16 DIAGNOSIS — E1142 Type 2 diabetes mellitus with diabetic polyneuropathy: Secondary | ICD-10-CM | POA: Insufficient documentation

## 2021-04-16 DIAGNOSIS — M545 Low back pain, unspecified: Secondary | ICD-10-CM | POA: Diagnosis not present

## 2021-04-16 DIAGNOSIS — G8929 Other chronic pain: Secondary | ICD-10-CM | POA: Diagnosis present

## 2021-04-16 DIAGNOSIS — M4316 Spondylolisthesis, lumbar region: Secondary | ICD-10-CM | POA: Insufficient documentation

## 2021-04-16 DIAGNOSIS — M5136 Other intervertebral disc degeneration, lumbar region: Secondary | ICD-10-CM | POA: Diagnosis not present

## 2021-04-16 DIAGNOSIS — M47816 Spondylosis without myelopathy or radiculopathy, lumbar region: Secondary | ICD-10-CM | POA: Insufficient documentation

## 2021-04-16 NOTE — Progress Notes (Signed)
PROVIDER NOTE: Information contained herein reflects review and annotations entered in association with encounter. Interpretation of such information and data should be left to medically-trained personnel. Information provided to patient can be located elsewhere in the medical record under "Patient Instructions". Document created using STT-dictation technology, any transcriptional errors that may result from process are unintentional.  ?  ?Patient: Charlene [name]  Service Category: E/M  Provider: Gaspar Cola, MD  ?DOB: 05-11-1960  DOS: 04/16/2021  Specialty: Interventional Pain Management  ?MRN: [id number]  Setting: Ambulatory outpatient  PCP: Tracie Harrier, MD  ?Type: Established Patient    Referring Provider: Tracie Harrier, MD  ?Location: Office  Delivery: Face-to-face    ? ?HPI  ?[name], a 61 y.o. year old female, is here today because of her Chronic bilateral low back pain without sciatica [M54.50, G89.29]. [name] primary complain today is Back Pain (lower) ?Last encounter: My last encounter with her was on 03/26/2021. ?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[place]); Chronic ankle pain (Left); Chronic foot pain (Left); Chronic lower extremity pain (2ry area of Pain) (Bilateral) (L>R); Chronic knee pain (4th area of Pain) (Bilateral) (L>R); Chronic low back pain (3ry area of Pain) (Bilateral) (L>R) w/ sciatica (Left); Chronic pain syndrome; DM type 2 with diabetic peripheral neuropathy (HCC); DDD (degenerative disc disease), lumbar; Lumbar facet arthropathy; Lumbar facet syndrome; Lumbar facet hypertrophy; Lumbar foraminal stenosis (L5-S1) (Right); Osteoarthritis of knee (Bilateral); Chronic musculoskeletal pain; Abnormal EMG (07/06/2017); Osteoarthritis involving multiple joints; 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  ([place]); Knee pain; Back pain with left-sided sciatica; Meningioma ([place]); Neck pain; Chronic pain disorder; Left leg weakness; Meralgia paraesthetica, left; Pain in both lower extremities; Arthropathy of lumbar facet joint; Chronic ankle and foot pain (1ry area of Pain) (Left); 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 ?Pain Assessment: Severity of Chronic pain is reported as a 0-No pain/10. Location: Back  /denies. Onset: More than a month ago. Quality: Aching, Dull. Timing:  . Modifying factor(s): rest, procedure, motrin. ?Vitals:  height is '4\' 11"'$  (1.499 m) and weight is 158 lb (71.7 kg). Her temperature is 97.3 ?F (36.3 ?C) (abnormal). Her blood pressure is 182/98 (abnormal) and her pulse is 99. Her respiration is 16 and oxygen saturation is 100%.  ? ?Reason for encounter: post-procedure evaluation and assessment.  Surprisingly, the patient again has attained some persistent relief of the pain despite the fact that we did not use any steroids in the procedure.  She refers having attained 100% relief of the pain for the duration of the local anesthetic which then went down to a 54% improvement in her back pain past the duration of those anesthetics.  Today we had a conversation regarding the options including the possibility of radiofrequency ablation.  She was encouraged to think about that option for possible long-term management.  Today the patient indicated that she has not had physical therapy for her lower back problems and therefore I will be referring her to have some, just in case we do decide to move onto the radiofrequency ablation. ? ?Post-procedure evaluation  ?  Type: Lumbar Facet, Medial Branch Block(s) #3 (NO  STEROIDS) ?Laterality: Right  ?Level: L2, L3, L4, L5, &  S1 Medial Branch Level(s). Injecting these levels blocks the L3-4 and L5-S1 lumbar facet joints. ?Imaging: Fluoroscopic guidance ?Anesthesia: Local anesthesia (1-2% Lidocaine) ?Anxiolysis: IV Versed         ?Sedation: None. ?DOS: 03/26/2021 ?Performed by: Gaspar Cola, MD ? ?Primary Purpose: Diagnostic/Therapeutic ?Indications: Low back pain severe enough to impact quality of life or function. ?1. Lumbar facet syndrome   ?2. Lumbar facet hypertrophy   ?3. Spondylosis without myelopathy or radiculopathy, lumbosacral region   ?4. 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 Lumbar facet arthropathy   ?6. DDD (degenerative disc disease), lumbar   ?7. Chronic low back pain (Bilateral) w/o sciatica   ? ?NAS-11 Pain score:  ? Pre-procedure: 1 /10  ? Post-procedure: 0-No pain/10  ? ?The patient indicates that the day she is having pain on both sides of her lower back, but since she was approved only to do the right side, we will be doing just that side. ? ?   ?Effectiveness:  ?Initial hour after procedure: 100 %. ?Subsequent 4-6 hours post-procedure: 100 %. ?Analgesia past initial 6 hours: 50 %. ?Ongoing improvement:  ?Analgesic: The patient indicates currently enjoying an ongoing 50% relief of the pain in the area of her lower back.  She did describes having some spasms and discomfort that traveled towards the abdominal region the day after the procedure, but that has gone away. ?Function: [name] reports improvement in function ?ROM: [name] reports improvement in ROM ? ?Pharmacotherapy Assessment  ?Analgesic: No chronic opioid analgesics therapy prescribed by our practice. ABNORMAL UDS (09/03/2020) (+) for COCAINE.  ? ?Monitoring: ?[place] PMP: PDMP reviewed during this encounter.       ?Pharmacotherapy: No side-effects or adverse reactions reported. ?Compliance: No problems identified. ?Effectiveness: Clinically acceptable. ? ?Ignatius Specking, RN   04/16/2021  2:37 PM  Sign when Signing Visit ?Safety precautions to be maintained throughout the outpatient stay will include: orient to surroundings, keep bed in low position, maintain call bell within reach at all times, provide assistance with transfer out of bed and ambulation.  ?   UDS:  ?Summary  ?Date Value Ref Range Status  ?03/11/2021 Note  Final  ?  Comment:  ?  ==================================================================== ?ToxASSURE Select 13 (MW) ?==================================================================== ?Test                             Result       Flag       Units ? ?  NO DRUGS DETECTED. ?==================================================================== ?Test                      Result    Flag   Units      Ref Range ?  Creatinine              123              mg/dL      >=20 ?==================================================================== ?Declared Medications: ? The flagging and interpretation on this report are based on the ? following declared medications.  Unexpected results may arise from ? inaccuracies in the declared medications. ? ? **Note: The testing scope of this panel does not include the ? following reported medications: ? ? Alendronate (Fosamax) ? Amlodipine (Norvasc) ? Baclofen (Lioresal) ? Carvedilol (Coreg) ? Dicyclomine (Bentyl) ? Duloxetine (Cymbalta) ? Hydralazine (Apresoline) ? Insulin (Toujeo) ? Lisinopril (Zestril) ? Magnesium ? Metformin (Glucophage) ? Pantoprazole (Protonix) ? Polyethylene Glycol ?  Pravastatin (Pravachol) ? Pregabalin (Lyrica) ? Sucralfate (Carafate) ? Vitamin D3 ?==================================================================== ?For clinical consultation, please call [phone]. ?==================================================================== ?  ?  ? ?ROS  ?Constitutional: Denies any fever or chills ?Gastrointestinal: No reported hemesis, hematochezia, vomiting, or acute GI distress ?Musculoskeletal: Denies any acute  onset joint swelling, redness, loss of ROM, or weakness ?Neurological: No reported episodes of acute onset apraxia, aphasia, dysarthria, agnosia, amnesia, paralysis, loss of coordination, or loss of consciousness ? ?Medic

## 2021-04-16 NOTE — Progress Notes (Signed)
Safety precautions to be maintained throughout the outpatient stay will include: orient to surroundings, keep bed in low position, maintain call bell within reach at all times, provide assistance with transfer out of bed and ambulation.  

## 2021-05-02 ENCOUNTER — Ambulatory Visit: Payer: Medicare Other | Attending: Pain Medicine | Admitting: Physical Therapy

## 2021-05-02 DIAGNOSIS — M6281 Muscle weakness (generalized): Secondary | ICD-10-CM | POA: Insufficient documentation

## 2021-05-02 DIAGNOSIS — R278 Other lack of coordination: Secondary | ICD-10-CM | POA: Insufficient documentation

## 2021-05-02 DIAGNOSIS — M5459 Other low back pain: Secondary | ICD-10-CM | POA: Insufficient documentation

## 2021-05-02 DIAGNOSIS — R293 Abnormal posture: Secondary | ICD-10-CM | POA: Insufficient documentation

## 2021-05-10 ENCOUNTER — Ambulatory Visit: Payer: Medicare Other | Admitting: Physical Therapy

## 2021-05-10 DIAGNOSIS — M6281 Muscle weakness (generalized): Secondary | ICD-10-CM | POA: Diagnosis present

## 2021-05-10 DIAGNOSIS — M5459 Other low back pain: Secondary | ICD-10-CM

## 2021-05-10 DIAGNOSIS — R293 Abnormal posture: Secondary | ICD-10-CM

## 2021-05-10 DIAGNOSIS — R278 Other lack of coordination: Secondary | ICD-10-CM | POA: Diagnosis present

## 2021-05-10 NOTE — Therapy (Addendum)
[facility] MAIN [facility] SERVICES [address] [place], [place], [address] Phone: [phone]   Fax:  [phone]  Physical Therapy Treatment  Patient Details  Name: Charlene [name] MRN: [id number] Date of Birth: April 12, 1960 Referring Provider (PT): Delano Metz, MD   Encounter Date: 05/10/2021   PT End of Session - 05/10/21 1047     Visit Number 1   Number of Visits 16    Date for PT Re-Evaluation 07/05/21    Progress Note Due on Visit 10    PT Start Time 1027    PT Stop Time 1120    PT Time Calculation (min) 53 min    Activity Tolerance Patient tolerated treatment well    Behavior During Therapy [facility] for tasks assessed/performed             Past Medical History:  Diagnosis Date   Arthritis    knees   Degenerative disc disease, lumbar    Diabetes mellitus without complication (HCC)    GERD (gastroesophageal reflux disease)    Hypertension    Neuropathy    left lower leg   Polyneuropathy    Polyneuropathy     Past Surgical History:  Procedure Laterality Date   ABDOMINAL HYSTERECTOMY     arthroscopic rotator cuff     CATARACT EXTRACTION W/PHACO Right 11/25/2017   Procedure: CATARACT EXTRACTION PHACO AND INTRAOCULAR LENS PLACEMENT (IOC) RIGHT DIABETIC;  Surgeon: Lockie Mola, MD;  Location: [facility];  Service: Ophthalmology;  Laterality: Right;  Diabetic - insulin and oral meds   COLONOSCOPY     endosopic sinus      There were no vitals filed for this visit.   Subjective Assessment - 05/10/21 1029     Subjective Pt reports she has had back pain chronically and she has to get injections every 4-5 months for management of low back pain. Pt lives in [place] which is about a 45 min drive from clinic. Pt ambulates with cane to clinic and has been ambulating with cane for 3 years. Pt reports no falls in the last 6 months but has had some incidences of loss of balance where she feels she is leaning  posteriorlly. Pt reports her lower back pain is worse when she has to stand for prolonged periods but she reports she is able to walk for prolonged periods without back pain but has some pain in the legs with prolonged walking. Pt reports pain in the area of her low back that wraps following her weist line and no complaints of radiating pain down her legs. Pt back pain began around 5 years ago and was a gradual progression in pain and discomfort. Pt reports standing on hard floors for many years of working may have contributing.    Pertinent History Pt reports she has had back pain chronically and she has to get injections every 4-5 months for management of low back pain. Pt lives in [place] which is about a 45 min drive from clinic. Pt ambulates with cane to clinic and has been ambulating with cane for 3 years. Pt reports no falls in the last 6 months but has had some incidences of loss of balance where she feels she is leaning posteriorlly. Pt reports her lower back pain is worse when she has to stand for prolonged periods but she reports she is able to walk for prolonged periods without back pain but has some pain in the legs with prolonged walking. Pt reports pain  in the area of her low back that wraps following her weist line and no complaints of radiating pain down her legs. Pt back pain began around 5 years ago and was a gradual progression in pain and discomfort. Pt reports standing on hard floors for many years of working may have contributing. 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 Unlimited    How long can you stand comfortably? Pain following 20 minutes of standing and after 30 minutes she has to sit    How long can you walk comfortably? Not sure, able to walk to clinic, c/o leg pain and fatigue with prolonged walking    Currently in Pain? Yes    Pain Score 1     Pain Location Back    Pain Orientation Lower    Pain Descriptors / Indicators Aching    Pain Type Chronic pain    Pain Onset More than a month ago    Pain Frequency Intermittent    Aggravating Factors  standing    Pain Relieving Factors rest, muscle relaxers, injections,    Effect of Pain on Daily Activities cooking, cleaning, chores involving prolonged standing,    Multiple Pain Sites No    Pain Score 1    Pain Location Leg    Pain Orientation Left    Pain Descriptors / Indicators Sore                [facility] PT Assessment - 05/10/21 0001       Assessment   Medical Diagnosis Low back pain    Referring Provider (PT) Delano Metz, MD    Onset Date/Surgical Date 04/20/17    Hand Dominance Right    Prior Therapy None for this problem      Precautions   Precautions Fall      Restrictions   Weight Bearing Restrictions No      Balance Screen   Has the patient fallen in the past 6 months No    How many times? 0    Has the patient had a decrease in activity level because of a fear of falling?  Yes    Is the patient reluctant to leave their home because of a fear of falling?  No      Prior Function   Level of Independence Independent with basic ADLs;Independent with household mobility with device      Cognition   Overall Cognitive Status Within Functional Limits for tasks assessed      Observation/Other Assessments   Focus on Therapeutic Outcomes (FOTO)  48     Other Surveys  Oswestry Disability Index    Oswestry Disability Index  40%      Standardized Balance Assessment   Standardized Balance Assessment Five Times Sit to Stand    Five times sit to stand comments  19.92 no UE               PAIN: 1/10 at start of session, pain increases  with prolonged standing   POSTURE: increased lordosis in standing,   Increased height minutes and piriformis muscle bilaterally.  No pain with flexion or lateral sidebending but did have increased pain with extension range of motion in standing  STRENGTH:  Graded on a 0-5 scale Muscle Group Left Right                          Hip Flex 4- 4  Hip Abd 4- 4  Hip Add    Hip Ext 4- 4      Knee Flex 4 4+  Knee Ext 4 4+           GAIT: Patient ambulates with SPC into clinic.  OUTCOME MEASURES: TEST Outcome Interpretation  5 times sit<>stand 19.88sec >89 yo, >15 sec indicates increased risk for falls              Oswestry disability index 40% 40% disability based on outcome measure  FOTO 48% 55% projected                         PT Education - 05/10/21 1042     Education Details POC    Person(s) Educated Patient    Methods Explanation;Demonstration    Comprehension Verbalized understanding              PT Short Term Goals - 05/10/21 1050       PT SHORT TERM GOAL #1   Title Patient will be independent in home exercise program to improve strength/mobility for better functional independence with ADLs.     Baseline No low back HEP    Time 4    Period Weeks    Status New    Target Date 06/07/21               PT Long Term Goals - 05/10/21 1054       PT LONG TERM GOAL #1   Title Patient will reduce modified Oswestry score to 20% as to demonstrate minimal disability with ADLs including improved sleeping tolerance, walking/sitting tolerance etc for better mobility with ADLs.    Baseline 05/10/21: 20/50 / 40%    Time 8    Period Weeks    Status New     Target Date 07/05/21      PT LONG TERM GOAL #2   Title Patient will increase FOTO score to equal to or greater than     to demonstrate statistically significant improvement in mobility and quality of life.    Time 8    Period Weeks    Status New    Target Date 07/05/21      PT LONG TERM GOAL #3   Title Patient will improve bilateral hip strength to 4+ out of 5 or greater for hip abduction, flexion, and extension in order to improve hip muscle strength and stability for prolonged standing    Baseline See initial evaluation    Time 8    Period Weeks    Status New    Target Date 07/05/21      PT LONG TERM GOAL #4   Title Patient report ability to stand for 30 minutes or longer without reports of increased low back pain in order to improve her ability to perform cooking and other standing ADLs without pain and discomfort    Baseline Able to stand 20 minutes prior to onset of low back pain    Time 8  Period Weeks    Status New    Target Date 07/05/21                   Plan - 05/10/21 1048     Clinical Impression Statement Patient presents for evaluation of low back pain.  Patient's low back pain is chronic in nature and she has been experiencing it for 4 to 5 years.  Patient does receive injections regularly in her low back which helps make her pain more manageable.  Patient presents with deficits in hip strength bilaterally but increased impairments on the left side compared to the right size as demonstrated in manual muscle testing.  Patient has increased tightness in her piriformis muscles bilaterally as well as increased pain discomfort with extension range of motion and standing.  Patient will benefit from home exercise program including hip strengthening, lower back strengthening and core strengthening, as well as low back stretches to relieve pain and discomfort associated with prolonged extension postures.  Patient will benefit from skilled physical therapy intervention in  order to improve her hip strength, improve her tolerance for standing positions, and improve her ability to complete ADLs involving prolonged standing as well as improve her overall quality of life and to prevent progression of low back pain.    Personal Factors and Comorbidities Comorbidity 3+;Time since onset of injury/illness/exacerbation    Comorbidities DM type II, severe diabetic neuropathy, HTN, arthritis, DDD, GERD, brain tumor    Examination-Activity Limitations Bend;Caring for Others;Carry;Reach Overhead;Squat;Stairs;Stand;Transfers    Examination-Participation Restrictions Cleaning;Community Activity;Meal Prep;Yard Work    Public house manager Low    Rehab Potential Fair    Clinical Impairments Affecting Rehab Potential medical comorbidities, chronicity    PT Frequency 2x / week    PT Duration 8 weeks    PT Treatment/Interventions ADLs/Self Care Home Management;Aquatic Therapy;Biofeedback;Cryotherapy;Electrical Stimulation;Iontophoresis 4mg /ml Dexamethasone;Moist Heat;Traction;Ultrasound;DME Instruction;Gait training;Stair training;Functional mobility training;Therapeutic activities;Therapeutic exercise;Balance training;Neuromuscular re-education;Patient/family education;Cognitive remediation;Orthotic Fit/Training;Manual techniques;Passive range of motion;Compression bandaging;Energy conservation;Splinting;Taping;Visual/perceptual remediation/compensation;Dry needling;Spinal Manipulations    PT Next Visit Plan Initiate physical therapy interventions as well as provide home exercise program including low back stretches which may include piriformis stretch, knee-to-chest stretch, and hip strengthening exercises.    PT Home Exercise Plan To be developed next session.    Consulted and Agree with Plan of Care Patient             Patient will benefit from skilled therapeutic intervention in order to improve the  following deficits and impairments:  Abnormal gait, Decreased activity tolerance, Decreased endurance, Decreased mobility, Decreased strength, Decreased range of motion, Improper body mechanics, Postural dysfunction, Pain, Impaired flexibility, Impaired perceived functional ability, Decreased balance  Visit Diagnosis: Other low back pain  Muscle weakness (generalized)  Posture abnormality     Problem List Patient Active Problem List   Diagnosis Date Noted   Chronic sacroiliac joint pain (Right) 03/11/2021   Diabetic retinopathy (HCC) 12/17/2020   Hypertensive retinopathy of both eyes 12/17/2020   NPDR (nonproliferative diabetic retinopathy) (HCC) 12/17/2020   Pseudophakia of right eye 12/17/2020   Disease of thyroid gland 12/17/2020   Abnormal drug screen (09/03/2020) 12/06/2020   Pain medication agreement broken (12/06/2020) 12/06/2020   History of cocaine use (09/03/2020) 12/06/2020   History of illicit drug use (09/03/2020) 12/06/2020   Substance use disorder 12/06/2020   Spondylosis without myelopathy or radiculopathy, lumbosacral region 10/09/2020   Chronic low back pain (Bilateral) w/o sciatica 10/09/2020   Chronic ankle and  foot pain (1ry area of Pain) (Left) 09/03/2020   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) 09/03/2020   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) 09/03/2020   Chronic use of opiate for therapeutic purpose 07/08/2020   Pain medication agreement signed 11/08/2019   Meningioma (HCC) 11/01/2019   Hx of resection of meningioma 09/14/2019   Osteoporosis 08/05/2019   Neck pain 03/11/2019   Knee pain 11/18/2018   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) 10/06/2018   Osteoarthritis involving multiple joints 08/04/2018   Type 2 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, with long-term current use of insulin (HCC) 03/22/2018   Abnormal EMG (07/06/2017) 11/23/2017    Long-term insulin use (HCC) 11/10/2017   Uncontrolled type 2 diabetes mellitus with hyperglycemia (HCC) 11/10/2017   Vitamin D deficiency 10/21/2017   DM type 2 with diabetic peripheral neuropathy (HCC) 10/21/2017   DDD (degenerative disc disease), lumbar 10/21/2017   Lumbar facet arthropathy 10/21/2017   Lumbar facet syndrome 10/21/2017   Lumbar facet hypertrophy 10/21/2017   Lumbar foraminal stenosis (L5-S1) (Right) 10/21/2017   Osteoarthritis of knee (Bilateral) 10/21/2017   Chronic musculoskeletal pain 10/21/2017   Arthropathy of lumbar facet joint 10/21/2017   Essential hypertension 10/12/2017   Chronic ankle pain (Left) 10/12/2017   Chronic foot pain (Left) 10/12/2017   Chronic lower extremity pain (2ry area of Pain) (Bilateral) (L>R) 10/12/2017   Chronic knee pain (4th area of Pain) (Bilateral) (L>R) 10/12/2017   Chronic low back pain (3ry area of Pain) (Bilateral) (L>R) w/ sciatica (Left) 10/12/2017   Chronic pain syndrome 10/12/2017   Opiate use 10/12/2017   Pharmacologic therapy 10/12/2017   Disorder of skeletal system 10/12/2017   Problems influencing health status 10/12/2017   Chronic pain disorder 10/12/2017   Neuropathy 08/11/2017   Burning sensation of feet 07/06/2017   Lower extremity numbness and tingling (Left) 07/06/2017   Left leg weakness 07/06/2017   Pain in both lower extremities 06/24/2017   Polyneuropathy associated with underlying disease (HCC) 05/29/2017   DM type 2, uncontrolled, with neuropathy 05/05/2017   Back pain with left-sided sciatica 10/16/2015   Meralgia paraesthetica, left 10/16/2015   Neuropathic pain 06/29/2015   GERD without esophagitis 01/16/2014   Diabetes mellitus (HCC) 01/21/2003    Norman Herrlich, PT 05/10/2021, 11:51 AM  [facility] MAIN [facility] SERVICES [address] [place], [place], [address] Phone: [phone]   Fax:  [phone]  Name: DEAUNNA RAGLE MRN: [id number] Date of  Birth: 1960/03/14

## 2021-05-14 ENCOUNTER — Ambulatory Visit: Payer: Medicare Other

## 2021-05-14 ENCOUNTER — Ambulatory Visit: Payer: Medicare Other | Admitting: Physical Therapy

## 2021-05-14 ENCOUNTER — Encounter: Payer: Self-pay | Admitting: Physical Therapy

## 2021-05-14 DIAGNOSIS — M5459 Other low back pain: Secondary | ICD-10-CM | POA: Diagnosis not present

## 2021-05-14 DIAGNOSIS — M6281 Muscle weakness (generalized): Secondary | ICD-10-CM

## 2021-05-14 DIAGNOSIS — R293 Abnormal posture: Secondary | ICD-10-CM

## 2021-05-14 NOTE — Patient Instructions (Signed)
Access Code: [email or web address] ?URL: [email or web address] ?Date: 05/14/2021 ?Prepared by: Blanche East ? ?Exercises ?- Supine Lower Trunk Rotation  - 1 x daily - 7 x weekly - 1 sets - 10 reps ?- Hooklying Single Knee to Chest Stretch  - 1 x daily - 7 x weekly - 1 sets - 2-3 reps - 15 sec hold ?- Supine Posterior Pelvic Tilt  - 1 x daily - 7 x weekly - 1 sets - 10 reps - 5 sec hold ?- Supine Bridge  - 1 x daily - 7 x weekly - 1 sets - 10 reps ?- Hooklying Isometric Clamshell  - 1 x daily - 7 x weekly - 1 sets - 10 reps ?- Supine March  - 1 x daily - 7 x weekly - 1 sets - 10 reps ?- Seated Table Hamstring Stretch  - 1 x daily - 7 x weekly - 1 sets - 2-3 reps - 20 sec hold ?

## 2021-05-14 NOTE — Therapy (Signed)
[facility] ?[place], [name], [address] ?Phone: [phone]   Fax:  [phone] ? ?Physical Therapy Treatment ? ?Patient Details  ?Name: Charlene [name] ?MRN: [id number] ?Date of Birth: 1960/06/08 ?Referring Provider (PT): Milinda Pointer, MD ? ? ?Encounter Date: 05/14/2021 ? ? PT End of Session - 05/14/21 1050   ? ? Visit Number 2   ? Number of Visits 16   ? Date for PT Re-Evaluation 07/05/21   ? Progress Note Due on Visit 10   ? PT Start Time 1034   ? PT Stop Time 1115   ? PT Time Calculation (min) 41 min   ? Activity Tolerance Patient tolerated treatment well;No increased pain   ? Behavior During Therapy [facility] for tasks assessed/performed   ? ?  ?  ? ?  ? ? ?Past Medical History:  ?Diagnosis Date  ? Arthritis   ? knees  ? Degenerative disc disease, lumbar   ? Diabetes mellitus without complication ([place])   ? GERD (gastroesophageal reflux disease)   ? Hypertension   ? Neuropathy   ? left lower leg  ? Polyneuropathy   ? Polyneuropathy   ? ? ?Past Surgical History:  ?Procedure Laterality Date  ? ABDOMINAL HYSTERECTOMY    ? arthroscopic rotator cuff    ? CATARACT EXTRACTION W/PHACO Right 11/25/2017  ? Procedure: CATARACT EXTRACTION PHACO AND INTRAOCULAR LENS PLACEMENT ([place]) RIGHT DIABETIC;  Surgeon: Leandrew Koyanagi, MD;  Location: [place];  Service: Ophthalmology;  Laterality: Right;  Diabetic - insulin and oral meds  ? COLONOSCOPY    ? endosopic sinus    ? ? ?There were no vitals filed for this visit. ? ? Subjective Assessment - 05/14/21 1045   ? ? Subjective Patient reports doing well. no pain currently;   ? Pertinent History Pt reports she has had back pain chronically and she has to get injections every 4-5 months for management of low back pain. Pt lives in [place] which is about a 45 min drive from clinic. Pt ambulates with cane to clinic and has been ambulating with cane for 3 years. Pt reports no falls in the last 6  months but has had some incidences of loss of balance where she feels she is leaning posteriorlly. Pt reports her lower back pain is worse when she has to stand for prolonged periods but she reports she is able to walk for prolonged periods without back pain but has some pain in the legs with prolonged walking. Pt reports pain in the area of her low back that wraps following her weist line and no complaints of radiating pain down her legs. Pt back pain began around 5 years ago and was a gradual progression in pain and discomfort. Pt reports standing on hard floors for many years of working may have contributing. 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 Limitations Standing   ? How long can you sit comfortably? Unlimited   ? How long can you stand comfortably? Pain following  20 minutes of standing and after 30 minutes she has to sit   ? How long can you walk comfortably? Not sure, able to walk to clinic, c/o leg pain and fatigue with prolonged walking   ? Currently in Pain? No/denies   ? Pain Onset More than a month ago   ? ?  ?  ? ?  ? ? ? ? ? ? ? ?TREATMENT: ?Initiated HEP: ?Instructed patient in lumbar/hip stretches as well as core strengthening exercise to help with long term management of back pain ? ?Hooklying: ?Feet apart windshield wipers x10 reps ?Single knee to chest stretch 20 sec hold x2 reps each LE ?Posterior pelvic tilt 5 sec  hold x10 reps, moderate VCs and demonstration for proper exercise technique. Patient has significant difficulty doing exercise ?Bridges with arms by side x10 reps with good control and positioning; denies any increase in pain with bridge, ? ?PT applied red tband around BLE: ?-hip abduction/ER x10 reps with cues to avoid painful ROM ?-hip flexion march x10 reps each LE with tension on band and cues to increase core stabilization for better hip control/strengthening; ? ?Windshield wipers trunk rotation x10 reps; educated patient in use of heat to help with pain control and to help alleviate stiffness; ? ?Long sitting hamstring stretch 20 sec hold ? Progressed with ankle DF/PF for neural glide x10 reps each LE ? ?Pt denies any increase in pain with exercise;  ? ? ?Provided written HEP for better adherence.  ? ? ? ? ? ? ? ? ? ? ? ? ? ? ? ? ? ? ? ? ? ? ? PT Education - 05/14/21 1046   ? ? Education Details core strengthening/lumbar flexion;   ? Person(s) Educated Patient   ? Methods Explanation;Verbal cues   ? Comprehension Verbalized understanding;Returned demonstration;Verbal cues required;Need further instruction   ? ?  ?  ? ?  ? ? ? PT Short Term Goals - 05/10/21 1050   ? ?  ? PT SHORT TERM GOAL #1  ? Title Patient will be independent in home exercise program to improve strength/mobility for better functional independence with ADLs.    ? Baseline No low back HEP   ? Time 4   ? Period Weeks   ? Status New   ? Target Date 06/07/21   ? ?  ?  ? ?  ? ? ? ? PT Long Term Goals - 05/10/21 1054   ? ?  ? PT LONG TERM GOAL #1  ? Title Patient will reduce modified Oswestry score to 20% as to demonstrate minimal disability with ADLs including improved sleeping tolerance, walking/sitting tolerance etc for better mobility with ADLs.   ? Baseline 05/10/21: 20/50 / 40%   ? Time 8   ? Period Weeks   ? Status New   ? Target Date 07/05/21   ?  ? PT LONG TERM GOAL #2  ? Title Patient will increase FOTO score to equal to or greater than      to demonstrate statistically significant improvement in mobility and quality of life.   ? Time 8   ? Period Weeks   ? Status New   ? Target Date 07/05/21   ?  ? PT LONG TERM GOAL #3  ? Title Patient will improve bilateral hip strength to 4+ out of 5 or greater for hip abduction, flexion, and extension in order to improve hip muscle strength and stability for prolonged standing   ? Baseline See initial evaluation   ?  Time 8   ? Period Weeks   ? Status New   ? Target Date 07/05/21   ?  ? PT LONG TERM GOAL #4  ? Title Patient report ability to stand for 30 minutes or longer without reports of increased low back pain in order to improve her ability to perform cooking and other standing ADLs without pain and discomfort   ? Baseline Able to stand 20 minutes prior to onset of low back pain   ? Time 8   ? Period Weeks   ? Status New   ? Target Date 07/05/21   ? ?  ?  ? ?  ? ? ? ? ? ? ? ? Plan - 05/14/21 1115   ? ? Clinical Impression Statement patient motivated and participated well within session. She was instructed in HEP for lumbar flexion exercise. Patient does require min VCs for correct exercise technique. She denies any increase in pain with exercise. She verbalized understanding. Patient would benefit from additional skilled PT Intervention to improve strength and mobility. She did have difficulty with posterior pelvic tilt and would benefit from additional instruction for proper technique.   ? Personal Factors and Comorbidities Comorbidity 3+;Time since onset of injury/illness/exacerbation   ? Comorbidities DM type II, severe diabetic neuropathy, HTN, arthritis, DDD, GERD, brain tumor   ? Examination-Activity Limitations Bend;Caring for Others;Carry;Reach Overhead;Squat;Stairs;Stand;Transfers   ? Examination-Participation Restrictions Cleaning;Community Activity;Meal Prep;Valla Leaver Work   ? Stability/Clinical Decision Making Evolving/Moderate complexity   ? Rehab Potential Fair   ? Clinical Impairments Affecting Rehab  Potential medical comorbidities, chronicity   ? PT Frequency 2x / week   ? PT Duration 8 weeks   ? PT Treatment/Interventions ADLs/Self Care Home Management;Aquatic Therapy;Biofeedback;Cryotherapy;Electr

## 2021-05-16 ENCOUNTER — Ambulatory Visit: Payer: Medicare Other

## 2021-05-16 DIAGNOSIS — R278 Other lack of coordination: Secondary | ICD-10-CM

## 2021-05-16 DIAGNOSIS — M6281 Muscle weakness (generalized): Secondary | ICD-10-CM

## 2021-05-16 DIAGNOSIS — M5459 Other low back pain: Secondary | ICD-10-CM

## 2021-05-16 NOTE — Therapy (Signed)
[facility] ?[place], [name], [address] ?Phone: [phone]   Fax:  [phone] ? ?Physical Therapy Treatment ? ?Patient Details  ?Name: Charlene [name] ?MRN: [id number] ?Date of Birth: 10-Dec-1960 ?Referring Provider (PT): Milinda Pointer, MD ? ? ?Encounter Date: 05/16/2021 ? ? PT End of Session - 05/16/21 1227   ? ? Visit Number 3   ? Number of Visits 16   ? Date for PT Re-Evaluation 07/05/21   ? Progress Note Due on Visit 10   ? PT Start Time 1201   ? PT Stop Time 1230   ? PT Time Calculation (min) 29 min   ? Activity Tolerance Patient tolerated treatment well;No increased pain   ? Behavior During Therapy [facility] for tasks assessed/performed   ? ?  ?  ? ?  ? ? ?Past Medical History:  ?Diagnosis Date  ? Arthritis   ? knees  ? Degenerative disc disease, lumbar   ? Diabetes mellitus without complication ([place])   ? GERD (gastroesophageal reflux disease)   ? Hypertension   ? Neuropathy   ? left lower leg  ? Polyneuropathy   ? Polyneuropathy   ? ? ?Past Surgical History:  ?Procedure Laterality Date  ? ABDOMINAL HYSTERECTOMY    ? arthroscopic rotator cuff    ? CATARACT EXTRACTION W/PHACO Right 11/25/2017  ? Procedure: CATARACT EXTRACTION PHACO AND INTRAOCULAR LENS PLACEMENT ([place]) RIGHT DIABETIC;  Surgeon: Leandrew Koyanagi, MD;  Location: [facility];  Service: Ophthalmology;  Laterality: Right;  Diabetic - insulin and oral meds  ? COLONOSCOPY    ? endosopic sinus    ? ? ?There were no vitals filed for this visit. ? ? Subjective Assessment - 05/16/21 1201   ? ? Subjective Pt reports back pain is 0/10 currently. She reports back pain over the past week has reached 3/10 when she starts getting spasms. She reports spasms depend on how long she is standing.   ? Pertinent History Pt reports she has had back pain chronically and she has to get injections every 4-5 months for management of low back pain. Pt lives in [place] [employer] which is about a 45  min drive from clinic. Pt ambulates with cane to clinic and has been ambulating with cane for 3 years. Pt reports no falls in the last 6 months but has had some incidences of loss of balance where she feels she is leaning posteriorlly. Pt reports her lower back pain is worse when she has to stand for prolonged periods but she reports she is able to walk for prolonged periods without back pain but has some pain in the legs with prolonged walking. Pt reports pain in the area of her low back that wraps following her weist line and no complaints of radiating pain down her legs. Pt back pain began around 5 years ago and was a gradual progression in pain and discomfort. Pt reports standing on hard floors for many years of working may have contributing. 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  Limitations Standing   ? How long can you sit comfortably? Unlimited   ? How long can you stand comfortably? Pain following 20 minutes of standing and after 30 minutes she has to sit   ? How long can you walk comfortably? Not sure, able to walk to clinic, c/o leg pain and fatigue with prolonged walking   ? Currently in Pain? No/denies   ? Pain Onset More than a month ago   ? ?  ?  ? ?  ? ?INTERVENTIONS -  ? ?TREATMENT: ? ?On mat table, hooklying: ?Feet apart windshield wipers x20 reps ? ?Single knee to chest stretch 30 sec hold x2 reps each LE ? ?Piriformis  stretch 2x30 sec each LE ? ?Bridges, arms at side 2x10 denies any increase in pain with bridge ? ?Green phsyioball hamstring curls 2x10  ? ?LTRs 10x  ? ?Open book 10x B ? ?Clamshells 12x B; pt  rates as easy. TC/VC for technique. Reports no pain ? ?Pelvic tilts - anterior and posterior 12x for each. Improved technique compared to what was noted in prior session.  ? ?Long sitting hamstring stretch 30 sec hold BLEs ? ? ?Pt educated throughout session about proper posture and technique with exercises. Improved exercise technique, movement at target joints, use of target muscles after min to mod verbal, visual, tactile cues. ? ? ? PT Education - 05/16/21 1217   ? ? Education Details exercise technique   ? Person(s) Educated Patient   ? Methods Explanation;Demonstration;Verbal cues;Tactile cues   ? Comprehension Returned demonstration;Verbalized understanding;Need further instruction;Verbal cues required   ? ?  ?  ? ?  ? ? ? PT Short Term Goals - 05/10/21 1050   ? ?  ? PT SHORT TERM GOAL #1  ? Title Patient will be independent in home exercise program to improve strength/mobility for better functional independence with ADLs.    ? Baseline No low back HEP   ? Time 4   ? Period Weeks   ? Status New   ? Target Date 06/07/21   ? ?  ?  ? ?  ? ? ? ? PT Long Term Goals - 05/10/21 1054   ? ?  ? PT LONG TERM GOAL #1  ? Title Patient will reduce modified Oswestry score to 20% as to demonstrate minimal disability with ADLs including improved sleeping tolerance, walking/sitting tolerance etc for better mobility with ADLs.   ? Baseline 05/10/21: 20/50 / 40%   ? Time 8   ? Period Weeks   ? Status New   ? Target Date 07/05/21   ?  ? PT LONG TERM GOAL #2  ? Title Patient will increase FOTO score to equal to or greater than     to demonstrate statistically significant improvement in mobility and quality of life.   ? Time 8   ? Period Weeks   ? Status New   ? Target Date 07/05/21   ?  ? PT LONG TERM GOAL #3  ? Title Patient will improve  bilateral hip strength to 4+ out of 5 or greater for hip abduction, flexion, and extension in order to improve hip muscle strength and stability for prolonged standing   ? Baseline See initial evaluation   ? Time 8   ? Period Weeks   ? Status New   ? Target Date 07/05/21   ?  ? PT LONG TERM GOAL #4  ? Title Patient report ability to stand for 30 minutes or longer without reports  of increased low back pain in order to improve her ability to perform cooking and other standing ADLs without pain and discomfort   ? Baseline Able to stand 20 minutes prior to onset of low back pain   ? Time 8   ? Period Weeks   ? Status New   ? Target Date 07/05/21   ? ?  ?  ? ?  ? ? ? ? ? ? ? ? Plan - 05/16/21 1337   ? ? Clinical Impression Statement Session limited secondary to pt late arrival. However, pt highly motivated to participate in PT. She tolerates interventions well without pain. Pt exhibits improved motor control/coordination on this date with pelvic tilts follwing minimal cuing. The pt will benefit from further skilled PT to continue to improve pain, strength and mobility.   ? Personal Factors and Comorbidities Comorbidity 3+;Time since onset of injury/illness/exacerbation   ? Comorbidities DM type II, severe diabetic neuropathy, HTN, arthritis, DDD, GERD, brain tumor   ? Examination-Activity Limitations Bend;Caring for Others;Carry;Reach Overhead;Squat;Stairs;Stand;Transfers   ? Examination-Participation Restrictions Cleaning;Community Activity;Meal Prep;Valla Leaver Work   ? Stability/Clinical Decision Making Evolving/Moderate complexity   ? Rehab Potential Fair   ? Clinical Impairments Affecting Rehab Potential medical comorbidities, chronicity   ? PT Frequency 2x / week   ? PT Duration 8 weeks   ? PT Treatment/Interventions ADLs/Self Care Home Management;Aquatic Therapy;Biofeedback;Cryotherapy;Electrical Stimulation;Iontophoresis '4mg'$ /ml Dexamethasone;Moist Heat;Traction;Ultrasound;DME Instruction;Gait training;Stair  training;Functional mobility training;Therapeutic activities;Therapeutic exercise;Balance training;Neuromuscular re-education;Patient/family education;Cognitive remediation;Orthotic Fit/Training;Manual techniques;Pass

## 2021-05-21 ENCOUNTER — Ambulatory Visit: Payer: Medicare Other | Attending: Pain Medicine | Admitting: Physical Therapy

## 2021-05-21 ENCOUNTER — Encounter: Payer: Medicare Other | Admitting: Physical Therapy

## 2021-05-21 ENCOUNTER — Encounter: Payer: Self-pay | Admitting: Physical Therapy

## 2021-05-21 DIAGNOSIS — R278 Other lack of coordination: Secondary | ICD-10-CM | POA: Diagnosis present

## 2021-05-21 DIAGNOSIS — M6281 Muscle weakness (generalized): Secondary | ICD-10-CM | POA: Diagnosis present

## 2021-05-21 DIAGNOSIS — M5459 Other low back pain: Secondary | ICD-10-CM | POA: Diagnosis present

## 2021-05-21 DIAGNOSIS — R293 Abnormal posture: Secondary | ICD-10-CM | POA: Insufficient documentation

## 2021-05-21 DIAGNOSIS — R2689 Other abnormalities of gait and mobility: Secondary | ICD-10-CM | POA: Diagnosis present

## 2021-05-21 NOTE — Therapy (Addendum)
[facility] ?[place], [name], [address] ?Phone: [phone]   Fax:  [phone] ? ?Physical Therapy Treatment ? ?Patient Details  ?Name: Charlene [name] ?MRN: [id number] ?Date of Birth: 04-Apr-1960 ?Referring Provider (PT): Milinda Pointer, MD ? ? ?Encounter Date: 05/21/2021 ? ? PT End of Session - 05/21/21 1117   ? ? Visit Number 4   ? Number of Visits 16   ? Date for PT Re-Evaluation 07/05/21   ? Progress Note Due on Visit 10   ? PT Start Time 1108   ? PT Stop Time 1147   ? PT Time Calculation (min) 39 min   ? Activity Tolerance Patient tolerated treatment well;No increased pain   ? Behavior During Therapy [facility] for tasks assessed/performed   ? ?  ?  ? ?  ? ? ?Past Medical History:  ?Diagnosis Date  ? Arthritis   ? knees  ? Degenerative disc disease, lumbar   ? Diabetes mellitus without complication ([place])   ? GERD (gastroesophageal reflux disease)   ? Hypertension   ? Neuropathy   ? left lower leg  ? Polyneuropathy   ? Polyneuropathy   ? ? ?Past Surgical History:  ?Procedure Laterality Date  ? ABDOMINAL HYSTERECTOMY    ? arthroscopic rotator cuff    ? CATARACT EXTRACTION W/PHACO Right 11/25/2017  ? Procedure: CATARACT EXTRACTION PHACO AND INTRAOCULAR LENS PLACEMENT ([place]) RIGHT DIABETIC;  Surgeon: Leandrew Koyanagi, MD;  Location: [facility];  Service: Ophthalmology;  Laterality: Right;  Diabetic - insulin and oral meds  ? COLONOSCOPY    ? endosopic sinus    ? ? ?There were no vitals filed for this visit. ? ? Subjective Assessment - 05/21/21 1115   ? ? Subjective Patient reports doing okay. She states her back pain has been a little better. She is not hurting much right now. Her legs were sore after last session with stretching legs;   ? Pertinent History Pt reports she has had back pain chronically and she has to get injections every 4-5 months for management of low back pain. Pt lives in [place] which is about a 45 min drive  from clinic. Pt ambulates with cane to clinic and has been ambulating with cane for 3 years. Pt reports no falls in the last 6 months but has had some incidences of loss of balance where she feels she is leaning posteriorlly. Pt reports her lower back pain is worse when she has to stand for prolonged periods but she reports she is able to walk for prolonged periods without back pain but has some pain in the legs with prolonged walking. Pt reports pain in the area of her low back that wraps following her weist line and no complaints of radiating pain down her legs. Pt back pain began around 5 years ago and was a gradual progression in pain and discomfort. Pt reports standing on hard floors for many years of working may have contributing. 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  Limitations Standing   ? How long can you sit comfortably? Unlimited   ? How long can you stand comfortably? Pain following 20 minutes of standing and after 30 minutes she has to sit   ? How long can you walk comfortably? Not sure, able to walk to clinic, c/o leg pain and fatigue with prolonged walking   ? Currently in Pain? Yes   ? Pain Score 1    ? Pain Location Back   ? Pain Orientation Lower   ? Pain Descriptors / Indicators Aching;Sore   ? Pain Type Chronic pain   ? Pain Onset More than a month ago   ? Pain Frequency Intermittent   ? Aggravating Factors  standing    ? Pain Relieving Factors rest/muscle relaxors/injections   ? Effect of Pain on Daily Activities cooking/cleaning/chores with prolonged standing;   ? Multiple Pain Sites No   ? ?  ?  ? ?  ? ? ? ? ?  ?TREATMENT: ?Initiated HEP: ?Instructed patient in lumbar/hip stretches as well as core strengthening exercise to help with long term management of back pain ?  ?Hooklying: ?Feet apart windshield wipers x10 reps ?Single knee to chest stretch 20 sec hold x2 reps each LE ?Hip circles clockwise/counterclockwise x5 reps each; ?Posterior pelvic tilt 5 sec hold x10 reps, moderate VCs and demonstration for proper exercise technique. Patient has significant difficulty doing exercise ?Bridges with arms by side x12 reps with good control and positioning; denies any increase in pain with bridge, ? Hooklying SLR hamstring stretch 20 sec hold ?            Progressed with ankle DF/PF for neural glide x10 reps each LE ?  ?Educated patient in using towel roll to support low back in sitting ?Also educated patient in posterior pelvic tilt 5 sec hold x10 reps; ? ? ?Educated patient in proper body mechanics with household chores including vacuuming, washing dishes etc. ?Provided written handout with proper body mechanics for patient to review;  ?  ?Provided written HEP for better adherence.  ?  ? ? ? ? ? ? ? ? ? ? ? ? ? ? ? ? ? ? ? ? ? ? ? ? PT Education - 05/21/21 1116   ? ? Education Details exercise technique;   ? Person(s) Educated Patient   ? Methods Explanation;Verbal cues   ? Comprehension Verbalized understanding;Returned demonstration;Verbal cues required;Need further instruction   ? ?  ?  ? ?  ? ? ? PT Short Term Goals - 05/10/21 1050   ? ?  ? PT SHORT TERM GOAL #1  ? Title Patient will be independent in home exercise program to improve strength/mobility for better functional independence with ADLs.    ? Baseline No low back HEP   ? Time 4   ? Period Weeks   ? Status New   ? Target Date 06/07/21   ? ?  ?  ? ?  ? ? ? ? PT Long Term  Goals - 05/10/21 1054   ? ?  ? PT LONG TERM GOAL #1  ? Title Patient will reduce modified Oswestry score to 20% as to demonstrate minimal disability with ADLs including improved sleeping tolerance, walking/sitting tolerance etc for better mobility with ADLs.   ? Baseline 05/10/21: 20/50 / 40%   ? Time 8   ? Period Weeks   ? Status New   ? Target Date 07/05/21   ?  ? PT LONG TERM GOAL #2  ?  Title Patient will increase FOTO score to equal to or greater than     to demonstrate statistically significant improvement in mobility and quality of life.   ? Time 8   ? Period Weeks   ? Status New   ? Target Date 07/05/21   ?  ? PT LONG TERM GOAL #3  ? Title Patient will improve bilateral hip strength to 4+ out of 5 or greater for hip abduction, flexion, and extension in order to improve hip muscle strength and stability for prolonged standing   ? Baseline See initial evaluation   ? Time 8   ? Period Weeks   ? Status New   ? Target Date 07/05/21   ?  ? PT LONG TERM GOAL #4  ? Title Patient report ability to stand for 30 minutes or longer without reports of increased low back pain in order to improve her ability to perform cooking and other standing ADLs without pain and discomfort   ? Baseline Able to stand 20 minutes prior to onset of low back pain   ? Time 8   ? Period Weeks   ? Status New   ? Target Date 07/05/21   ? ?  ?  ? ?  ? ? ? ? ? ? ? ? Plan - 05/21/21 1117   ? ? Clinical Impression Statement Patient late to session; She was instructed in core stabilization exercise to reduce back pain. She continues to have difficulty achieving posterior pelvic tilt. Patient was educated in proper sitting posture as well as positioning with various ADLs/household chores to help reduce back pain. Provided written handout with body mechanics to help alleviate back pain with various household chores. She would benefit from additional skilled PT Intervention to improve core/LE strength as well as review of body mechanics;   ? Personal  Factors and Comorbidities Comorbidity 3+;Time since onset of injury/illness/exacerbation   ? Comorbidities DM type II, severe diabetic neuropathy, HTN, arthritis, DDD, GERD, brain tumor   ? Examination-Act

## 2021-05-23 ENCOUNTER — Encounter: Payer: Medicare Other | Admitting: Physical Therapy

## 2021-05-26 NOTE — Progress Notes (Signed)
PROVIDER NOTE: Information contained herein reflects review and annotations entered in association with encounter. Interpretation of such information and data should be left to medically-trained personnel. Information provided to patient can be located elsewhere in the medical record under "Patient Instructions". Document created using STT-dictation technology, any transcriptional errors that may result from process are unintentional.  ?  ?Patient: Charlene [name]  Service Category: E/M  Provider: Gaspar Cola, MD  ?DOB: May 27, 1960  DOS: 05/28/2021  Specialty: Interventional Pain Management  ?MRN: [id number]  Setting: Ambulatory outpatient  PCP: Tracie Harrier, MD  ?Type: Established Patient    Referring Provider: Tracie Harrier, MD  ?Location: Office  Delivery: Face-to-face    ? ?HPI  ?Ms. Georga [name], a 61 y.o. year old female, is here today because of her Chronic pain syndrome [G89.4]. Ms. Martinec primary complain today is Knee Pain ?Last encounter: My last encounter with her was on 04/16/2021. ?Pertinent problems: Ms. Kasperski has Burning sensation of feet; Neuropathic pain; Neuropathy; Lower extremity numbness and tingling (Left); Polyneuropathy associated with underlying disease ([place]); Chronic ankle pain (Left); Chronic foot pain (Left); Chronic lower extremity pain (2ry area of Pain) (Bilateral) (L>R); Chronic knee pain (4th area of Pain) (Bilateral) (L>R); Chronic low back pain (3ry area of Pain) (Bilateral) (L>R) w/ sciatica (Left); Chronic pain syndrome; DM type 2 with diabetic peripheral neuropathy (HCC); DDD (degenerative disc disease), lumbar; Lumbar facet arthropathy; Lumbar facet syndrome; Lumbar facet hypertrophy; Lumbar foraminal stenosis (L5-S1) (Right); Osteoarthritis of knee (Bilateral); Chronic musculoskeletal pain; Abnormal EMG (07/06/2017); Osteoarthritis involving multiple joints; 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[place]); Knee pain; Back pain with left-sided  sciatica; Meningioma ([place]); Neck pain; Chronic pain disorder; Left leg weakness; Meralgia paraesthetica, left; Pain in both lower extremities; Arthropathy of lumbar facet joint; Chronic ankle and foot pain (1ry area of Pain) (Left); 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 ?Pain Assessment: Severity of Chronic pain is reported as a 1 /10. Location: Knee Right, Left, Medial, Anterior/Denies. Onset: More than a month ago. Quality: Constant, Dull. Timing: Constant. Modifying factor(s): Sitting, resting, muscle relaxer, tylenol. ?Vitals:  height is $RemoveB'4\' 11"'HwIlFnUX$  (1.499 m) and weight is 158 lb (71.7 kg). Her temporal temperature is 98.3 ?F (36.8 ?C). Her blood pressure is 169/87 (abnormal) and her pulse is 97. Her respiration is 18 and oxygen saturation is 100%.  ? ?Reason for encounter: medication management.  The patient has indicated that she is currently undergoing physical therapy and that it seems to be helping to a certain degree with the lower back.  She is well aware that if this does not work, the plan is to do radiofrequency ablation of the lumbar facets.  Today she has requested a refill on her tramadol.  She is also aware that because of her abnormal UDS on 09/03/2020, we will be closely monitoring her medication use.  Today I have provided her with a single prescription for tramadol 50 mg #60 to take 1 tablet p.o. twice daily as needed for extreme pain. ? ?Pharmacotherapy Assessment  ?Analgesic: No chronic opioid analgesics therapy prescribed by our practice. ABNORMAL UDS (09/03/2020) (+) for COCAINE.  ? ?Monitoring: ?[place] PMP: PDMP reviewed during this encounter.       ?Pharmacotherapy: No side-effects or adverse reactions reported. ?Compliance:  No problems identified. ?Effectiveness: Clinically  acceptable. ? ?No notes on file  UDS:  ?Summary  ?Date Value Ref Range Status  ?03/11/2021 Note  Final  ?  Comment:  ?  ==================================================================== ?ToxASSURE Select 13 (MW) ?==================================================================== ?Test                             Result       Flag       Units ? ?  NO DRUGS DETECTED. ?==================================================================== ?Test                      Result    Flag   Units      Ref Range ?  Creatinine              123              mg/dL      >=20 ?==================================================================== ?Declared Medications: ? The flagging and interpretation on this report are based on the ? following declared medications.  Unexpected results may arise from ? inaccuracies in the declared medications. ? ? **Note: The testing scope of this panel does not include the ? following reported medications: ? ? Alendronate (Fosamax) ? Amlodipine (Norvasc) ? Baclofen (Lioresal) ? Carvedilol (Coreg) ? Dicyclomine (Bentyl) ? Duloxetine (Cymbalta) ? Hydralazine (Apresoline) ? Insulin (Toujeo) ? Lisinopril (Zestril) ? Magnesium ? Metformin (Glucophage) ? Pantoprazole (Protonix) ? Polyethylene Glycol ? Pravastatin (Pravachol) ? Pregabalin (Lyrica) ? Sucralfate (Carafate) ? Vitamin D3 ?==================================================================== ?For clinical consultation, please call [phone]. ?==================================================================== ?  ?  ? ?ROS  ?Constitutional: Denies any fever or chills ?Gastrointestinal: No reported hemesis, hematochezia, vomiting, or acute GI distress ?Musculoskeletal: Denies any acute onset joint swelling, redness, loss of ROM, or weakness ?Neurological: No reported episodes of acute onset apraxia, aphasia, dysarthria, agnosia, amnesia, paralysis, loss of coordination, or loss of  consciousness ? ?Medication Review  ?DULoxetine, FreeStyle [employer], Magnesium, Vitamin D3, amLODipine, baclofen, carvedilol, ciprofloxacin, dicyclomine, hydrALAZINE, insulin glargine (2 Unit Dial), insulin lispro, lisinopril, metFORMIN, pantoprazole, polyethylene glycol, pravastatin, pregabalin, sucralfate, and traMADol ? ?History Review  ?Allergy: Ms. Gallentine is allergic to semaglutide. ?Drug: Ms. Oviatt  reports no history of drug use. ?Alcohol:  reports that she does not currently use alcohol. ?Tobacco:  reports that she has never smoked. She has never used smokeless tobacco. ?Social: Ms. Broussard  reports that she has never smoked. She has never used smokeless tobacco. She reports that she does not currently use alcohol. She reports that she does not use drugs. ?Medical:  has a past medical history of Arthritis, Degenerative disc disease, lumbar, Diabetes mellitus without complication ([place]), GERD (gastroesophageal reflux disease), Hypertension, Neuropathy, Polyneuropathy, and Polyneuropathy. ?Surgical: Ms. Savary  has a past surgical history that includes arthroscopic rotator cuff; Colonoscopy; Abdominal hysterectomy; endosopic sinus; and Cataract extraction w/PHACO (Right, 11/25/2017). ?Family: family history includes Alcohol abuse in her father; Breast cancer in her maternal aunt; Cancer in her father. ? ?Laboratory Chemistry Profile  ? ?Renal ?Lab Results  ?Component Value Date  ? BUN 13 12/23/2018  ? CREATININE 0.59 12/23/2018  ? BCR 17 10/12/2017  ? GFRAA >60 12/23/2018  ? GFRNONAA >60 12/23/2018  ?  Hepatic ?Lab Results  ?Component Value Date  ? AST 17 10/21/2018  ? ALT 21 10/21/2018  ? ALBUMIN 3.6 10/21/2018  ? ALKPHOS 81 10/21/2018  ? LIPASE 20 10/21/2018  ?  ?Electrolytes ?Lab Results  ?Component Value Date  ? NA 137 12/23/2018  ? K 4.1  12/23/2018  ? CL 100 12/23/2018  ? CALCIUM 9.8 12/23/2018  ? MG 2.0 10/12/2017  ?  Bone ?Lab Results  ?Component Value Date  ? 25OHVITD1 17 (L) 10/12/2017  ?  [id number] <1.0 10/12/2017  ? 25OHVITD3 17 10/12/2017  ?  ?Inflammation (CRP: Acute Phase) (ESR: Chronic Phase) ?Lab Results  ?Component Value Date  ? CRP 4 10/12/2017  ? ESRSEDRATE 12 10/12/2017  ?    ?  ? ?Not

## 2021-05-27 ENCOUNTER — Ambulatory Visit: Payer: Medicare Other | Admitting: Physical Therapy

## 2021-05-27 DIAGNOSIS — M5459 Other low back pain: Secondary | ICD-10-CM | POA: Diagnosis not present

## 2021-05-27 DIAGNOSIS — R293 Abnormal posture: Secondary | ICD-10-CM

## 2021-05-27 DIAGNOSIS — M6281 Muscle weakness (generalized): Secondary | ICD-10-CM

## 2021-05-27 NOTE — Therapy (Signed)
[facility] ?[place], [name], [address] ?Phone: [phone]   Fax:  [phone] ? ?Physical Therapy Treatment ? ?Patient Details  ?Name: Charlene [name] ?MRN: [id number] ?Date of Birth: 1960/10/14 ?Referring Provider (PT): Milinda Pointer, MD ? ? ?Encounter Date: 05/27/2021 ? ? PT End of Session - 05/27/21 0944   ? ? Visit Number 5   ? Number of Visits 16   ? Date for PT Re-Evaluation 07/05/21   ? Progress Note Due on Visit 10   ? PT Start Time [phone]   ? PT Stop Time 1035   ? PT Time Calculation (min) 40 min   ? Activity Tolerance Patient tolerated treatment well;No increased pain   ? Behavior During Therapy [facility] for tasks assessed/performed   ? ?  ?  ? ?  ? ? ?Past Medical History:  ?Diagnosis Date  ? Arthritis   ? knees  ? Degenerative disc disease, lumbar   ? Diabetes mellitus without complication ([place])   ? GERD (gastroesophageal reflux disease)   ? Hypertension   ? Neuropathy   ? left lower leg  ? Polyneuropathy   ? Polyneuropathy   ? ? ?Past Surgical History:  ?Procedure Laterality Date  ? ABDOMINAL HYSTERECTOMY    ? arthroscopic rotator cuff    ? CATARACT EXTRACTION W/PHACO Right 11/25/2017  ? Procedure: CATARACT EXTRACTION PHACO AND INTRAOCULAR LENS PLACEMENT ([place]) RIGHT DIABETIC;  Surgeon: Leandrew Koyanagi, MD;  Location: [facility];  Service: Ophthalmology;  Laterality: Right;  Diabetic - insulin and oral meds  ? COLONOSCOPY    ? endosopic sinus    ? ? ?There were no vitals filed for this visit. ? ? Subjective Assessment - 05/27/21 0944   ? ? Subjective Pt reports no pain at this time. Has some soreness in her LEs bilaterally which she cannot attribute to any particular activity. She localizes soress to quads musculature and anterior tibilais   ? Pertinent History Pt reports she has had back pain chronically and she has to get injections every 4-5 months for management of low back pain. Pt lives in [place] which is about  a 45 min drive from clinic. Pt ambulates with cane to clinic and has been ambulating with cane for 3 years. Pt reports no falls in the last 6 months but has had some incidences of loss of balance where she feels she is leaning posteriorlly. Pt reports her lower back pain is worse when she has to stand for prolonged periods but she reports she is able to walk for prolonged periods without back pain but has some pain in the legs with prolonged walking. Pt reports pain in the area of her low back that wraps following her weist line and no complaints of radiating pain down her legs. Pt back pain began around 5 years ago and was a gradual progression in pain and discomfort. Pt reports standing on hard floors for many years of working may have contributing. 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  Limitations Standing   ? How long can you sit comfortably? Unlimited   ? How long can you stand comfortably? Pain following 20 minutes of standing and after 30 minutes she has to sit   ? How long can you walk comfortably? Not sure, able to walk to clinic, c/o leg pain and fatigue with prolonged walking   ? Pain Onset More than a month ago   ? ?  ?  ? ?  ? ?TREATMENT: ? ?On mat table, hooklying: ? ?Feet apart windshield wipers x10 x 5 second holds ? ?Single knee to chest stretch 30 sec hold x2 reps each LE ? ?Piriformis stretch 2x30 sec each LE ? ?Pelvic  tilts - Posterior pelvic tilts with holds x 5 seconds, cues for " pulling in belly button" and pt demonstrates good technique with this this session.  ? ?PPT with march x 10 ea LE, cues for maintaining stability in transverse plane but following cue pt performs well.  ? ?Bridges, arms at side 2x12 denies any increase in pain with bridge ? ?Clamshells 12x 5 second holds GTB; pt  reports no pain. Moderate difficulty  ? ?Green phsyioball hamstring curls 2x10, reports fatigue in back of legs.  ? ?Long sitting hamstring stretch 30 sec hold BLEs ? ?Wall squat with ball x 10, assistance with position and cues for foot positioning.  ? ? ?Pt educated throughout session about proper posture and technique with exercises. Improved exercise technique, movement at target joints, use of target muscles after min to mod verbal, visual, tactile cues. ? ? ? ? ? ? ? ? ? ? ? ? ? ? ? ? ? ? ? ? ? ? ? ? ? ? ? PT Education - 05/27/21 0944   ? ? Education Details exercise technique   ? Person(s) Educated Patient   ? Methods Explanation;Demonstration   ? Comprehension Verbalized understanding;Returned demonstration   ? ?  ?  ? ?  ? ? ? PT Short Term Goals - 05/10/21 1050   ? ?  ? PT SHORT TERM GOAL #1  ? Title Patient will be independent in home exercise program to improve strength/mobility for better functional independence with ADLs.    ? Baseline No low back HEP   ? Time 4   ? Period Weeks   ? Status New   ? Target Date 06/07/21   ? ?  ?  ? ?  ? ? ? ? PT Long Term Goals - 05/10/21 1054   ? ?  ? PT LONG TERM GOAL #1  ? Title Patient will reduce modified Oswestry score to 20% as to demonstrate minimal disability with ADLs including improved sleeping tolerance, walking/sitting tolerance etc for better mobility with ADLs.   ? Baseline 05/10/21: 20/50 / 40%   ? Time 8   ? Period Weeks   ? Status New   ? Target Date 07/05/21   ?  ? PT LONG TERM GOAL #2  ? Title Patient will increase FOTO score to equal to or greater than     to demonstrate  statistically significant improvement in mobility and quality of life.   ? Time 8   ? Period Weeks   ? Status New   ? Target Date 07/05/21   ?  ? PT LONG TERM GOAL #3  ? Title Patient will improve bilateral hip strength to 4+ out of 5 or greater for hip abduction, flexion, and extension in order to improve hip muscle strength and stability for prolonged  standing   ? Baseline See initial evaluation   ? Time 8   ? Period Weeks   ? Status New   ? Target Date 07/05/21   ?  ? PT LONG TERM GOAL #4  ? Title Patient report ability to stand for 30 minutes or longer without reports of increased low back pain in order to improve her ability to perform cooking and other standing ADLs without pain and discomfort   ? Baseline Able to stand 20 minutes prior to onset of low back pain   ? Time 8   ? Period Weeks   ? Status New   ? Target Date 07/05/21   ? ?  ?  ? ?  ? ? ? ? ? ? ? ? Plan - 05/27/21 1019   ? ? Clinical Impression Statement Pt presents with good motivation for sopmletion of PT exercises. Pt pain levels have improved significantly thoughout PT sessions. Pt progresses with postural muscle activation this session with good response as well as improved form compared to previous sessions. Pt has visit with MD this week for follow up of LBP. Pt will continue to benefit from skilled PT for progression postural and core muscle activation.   ? Personal Factors and Comorbidities Comorbidity 3+;Time since onset of injury/illness/exacerbation   ? Comorbidities DM type II, severe diabetic neuropathy, HTN, arthritis, DDD, GERD, brain tumor   ? Examination-Activity Limitations Bend;Caring for Others;Carry;Reach Overhead;Squat;Stairs;Stand;Transfers   ? Examination-Participation Restrictions Cleaning;Community Activity;Meal Prep;[employer] Work   ? Stability/Clinical Decision Making Evolving/Moderate complexity   ? Rehab Potential Fair   ? Clinical Impairments Affecting Rehab Potential medical comorbidities, chronicity   ? PT Frequency 2x /  week   ? PT Duration 8 weeks   ? PT Treatment/Interventions ADLs/Self Care Home Management;Aquatic Therapy;Biofeedback;Cryotherapy;Electrical Stimulation;Iontophoresis '4mg'$ /ml Dexamethasone;Moist Heat;Tract

## 2021-05-28 ENCOUNTER — Encounter: Payer: Self-pay | Admitting: Pain Medicine

## 2021-05-28 ENCOUNTER — Ambulatory Visit: Payer: Medicare Other | Attending: Pain Medicine | Admitting: Pain Medicine

## 2021-05-28 VITALS — BP 169/87 | HR 97 | Temp 98.3°F | Resp 18 | Ht 59.0 in | Wt 158.0 lb

## 2021-05-28 DIAGNOSIS — G894 Chronic pain syndrome: Secondary | ICD-10-CM | POA: Insufficient documentation

## 2021-05-28 DIAGNOSIS — G8929 Other chronic pain: Secondary | ICD-10-CM | POA: Diagnosis present

## 2021-05-28 DIAGNOSIS — M25561 Pain in right knee: Secondary | ICD-10-CM | POA: Diagnosis present

## 2021-05-28 DIAGNOSIS — M545 Low back pain, unspecified: Secondary | ICD-10-CM | POA: Diagnosis present

## 2021-05-28 DIAGNOSIS — M5442 Lumbago with sciatica, left side: Secondary | ICD-10-CM | POA: Insufficient documentation

## 2021-05-28 DIAGNOSIS — M47816 Spondylosis without myelopathy or radiculopathy, lumbar region: Secondary | ICD-10-CM | POA: Insufficient documentation

## 2021-05-28 DIAGNOSIS — M25562 Pain in left knee: Secondary | ICD-10-CM | POA: Diagnosis present

## 2021-05-28 DIAGNOSIS — M25572 Pain in left ankle and joints of left foot: Secondary | ICD-10-CM | POA: Insufficient documentation

## 2021-05-28 DIAGNOSIS — M4316 Spondylolisthesis, lumbar region: Secondary | ICD-10-CM | POA: Diagnosis present

## 2021-05-28 DIAGNOSIS — E1142 Type 2 diabetes mellitus with diabetic polyneuropathy: Secondary | ICD-10-CM | POA: Diagnosis present

## 2021-05-28 DIAGNOSIS — M79605 Pain in left leg: Secondary | ICD-10-CM | POA: Insufficient documentation

## 2021-05-28 DIAGNOSIS — M79604 Pain in right leg: Secondary | ICD-10-CM | POA: Diagnosis present

## 2021-05-28 MED ORDER — TRAMADOL HCL 50 MG PO TABS: 50.0000 mg | ORAL_TABLET | Freq: Two times a day (BID) | ORAL | 0 refills | Status: DC

## 2021-05-29 ENCOUNTER — Ambulatory Visit: Payer: Medicare Other | Admitting: Physical Therapy

## 2021-05-29 ENCOUNTER — Encounter: Payer: Medicare Other | Admitting: Physical Therapy

## 2021-05-29 DIAGNOSIS — R293 Abnormal posture: Secondary | ICD-10-CM

## 2021-05-29 DIAGNOSIS — M5459 Other low back pain: Secondary | ICD-10-CM

## 2021-05-29 DIAGNOSIS — M6281 Muscle weakness (generalized): Secondary | ICD-10-CM

## 2021-05-29 NOTE — Therapy (Signed)
[place] [facility] ?[place], [name], [address] ?Phone: [phone]   Fax:  [phone] ? ?Physical Therapy Treatment ? ?Patient Details  ?Name: Charlene [name] ?MRN: [id number] ?Date of Birth: February 09, 1960 ?Referring Provider (PT): Milinda Pointer, MD ? ? ?Encounter Date: 05/29/2021 ? ? PT End of Session - 05/29/21 6962   ? ? Visit Number 6   ? Number of Visits 16   ? Date for PT Re-Evaluation 07/05/21   ? Progress Note Due on Visit 10   ? PT Start Time [phone]   ? PT Stop Time 0917   ? PT Time Calculation (min) 45 min   ? Activity Tolerance Patient tolerated treatment well;No increased pain   ? Behavior During Therapy [facility] for tasks assessed/performed   ? ?  ?  ? ?  ? ? ?Past Medical History:  ?Diagnosis Date  ? Arthritis   ? knees  ? Degenerative disc disease, lumbar   ? Diabetes mellitus without complication ([place])   ? GERD (gastroesophageal reflux disease)   ? Hypertension   ? Neuropathy   ? left lower leg  ? Polyneuropathy   ? Polyneuropathy   ? ? ?Past Surgical History:  ?Procedure Laterality Date  ? ABDOMINAL HYSTERECTOMY    ? arthroscopic rotator cuff    ? CATARACT EXTRACTION W/PHACO Right 11/25/2017  ? Procedure: CATARACT EXTRACTION PHACO AND INTRAOCULAR LENS PLACEMENT ([place]) RIGHT DIABETIC;  Surgeon: Leandrew Koyanagi, MD;  Location: [facility];  Service: Ophthalmology;  Laterality: Right;  Diabetic - insulin and oral meds  ? COLONOSCOPY    ? endosopic sinus    ? ? ?There were no vitals filed for this visit. ? ? Subjective Assessment - 05/29/21 0832   ? ? Subjective Pt reports no pain at this time. Has some soreness in her LEs bilaterally which she cannot attribute to any particular activity. She localizes soress to quads musculature and anterior tibilais.   ? Pertinent History Pt reports she has had back pain chronically and she has to get injections every 4-5 months for management of low back pain. Pt lives in [place] [employer] which is  about a 45 min drive from clinic. Pt ambulates with cane to clinic and has been ambulating with cane for 3 years. Pt reports no falls in the last 6 months but has had some incidences of loss of balance where she feels she is leaning posteriorlly. Pt reports her lower back pain is worse when she has to stand for prolonged periods but she reports she is able to walk for prolonged periods without back pain but has some pain in the legs with prolonged walking. Pt reports pain in the area of her low back that wraps following her weist line and no complaints of radiating pain down her legs. Pt back pain began around 5 years ago and was a gradual progression in pain and discomfort. Pt reports standing on hard floors for many years of working may have contributing. 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  Limitations Standing   ? How long can you sit comfortably? Unlimited   ? How long can you stand comfortably? Pain following 20 minutes of standing and after 30 minutes she has to sit   ? How long can you walk comfortably? Not sure, able to walk to clinic, c/o leg pain and fatigue with prolonged walking   ? Currently in Pain? No/denies   ? Pain Onset More than a month ago   ? ?  ?  ? ?  ? ? ?  ? ?TREATMENT: ? ?On mat table, hooklying: ? ?Feet apart windshield wipers x10 x 5 second holds ? ?Single knee to chest stretch 30 sec hold x2 reps each  LE ? ?Piriformis stretch 2x30 sec each LE ? ?Pelvic tilts - Posterior pelvic tilts with holds x 5 seconds, cues for " pulling in belly button" and pt demonstrates good technique with this this session.  ? ?PPT with march x 10 ea LE, cues for maintaining stability in transverse plane but following cue pt performs well.  ? ?Dead bugs UE only x 10 ea  ?-cues for sequencing and proper positioning for adequate muscle activation  ? ?Dead bugs LE only x 10 ea  ?-cues for sequencing and proper positioning for adequate muscle activation  ? ?Bridges, arms at side 2x12 denies any increase in pain with bridge ? ?Clamshells 12 x 5 second holds GTB; pt  reports no pain. Moderate difficulty  ? ?SAQ with PPT 2 x 10 x 4# AW  ?Green phsyioball hamstring curls 2x10, reports fatigue in back of legs.  ? ?Long sitting hamstring stretch 30 sec hold BLEs ? ?Wall squat with ball x 10, assistance with position and cues for foot positioning.  ? ?Pt educated regarding posture with lifting and household activities. Pt provided with live feedback of these activities this session as well as active demonstration and education regarding benefits of these techniques (biomechanically).  ? ?Pt educated throughout session about proper posture and technique with exercises. Improved exercise technique, movement at target joints, use of target muscles after min to mod verbal, visual, tactile cues. ? ? ? ? ? ? ? ? ? ? ? ? ? ? ? ? ? ? ? ? ? ? ? ? ? ? ? ? ? PT Education - 05/29/21 0922   ? ? Education Details Chief Financial Officer with lifting   ? Person(s) Educated Patient   ? Methods Explanation;Demonstration   ? Comprehension Verbalized understanding;Returned demonstration   ? ?  ?  ? ?  ? ? ? PT Short Term Goals - 05/10/21 1050   ? ?  ? PT SHORT TERM GOAL #1  ? Title Patient will be independent in home exercise program to improve strength/mobility for better functional independence with ADLs.    ? Baseline No low back HEP   ? Time 4   ? Period Weeks   ? Status  New   ? Target Date 06/07/21   ? ?  ?  ? ?  ? ? ? ? PT Long Term Goals - 05/10/21 1054   ? ?  ? PT LONG TERM GOAL #1  ? Title Patient will reduce modified Oswestry score to 20% as to demonstrate minimal disability with ADLs including improved sleeping tolerance, walking/sitting tolerance etc for better mobility with ADLs.   ? Baseline 05/10/21: 20/50 / 40%   ? Time 8   ? Period Weeks   ? Status New   ? Target Date 07/05/21   ?  ?  PT LONG TERM GOAL #2  ? Title Patient will increase FOTO score to equal to or greater than     to demonstrate statistically significant improvement in mobility and quality of life.   ? Time 8   ? Period Weeks   ? Status New   ? Target Date 07/05/21   ?  ? PT LONG TERM GOAL #3  ? Title Patient will improve bilateral hip strength to 4+ out of 5 or greater for hip abduction, flexion, and extension in order to improve hip muscle strength and stability for prolonged standing   ? Baseline See initial evaluation   ? Time 8   ? Period Weeks   ? Status New   ? Target Date 07/05/21   ?  ? PT LONG TERM GOAL #4  ? Title Patient report ability to stand for 30 minutes or longer without reports of increased low back pain in order to improve her ability to perform cooking and other standing ADLs without pain and discomfort   ? Baseline Able to stand 20 minutes prior to onset of low back pain   ? Time 8   ? Period Weeks   ? Status New   ? Target Date 07/05/21   ? ?  ?  ? ?  ? ? ? ? ? ? ? ? Plan - 05/29/21 0838   ? ? Clinical Impression Statement Patient presents with excellent motivation for completion of physical therapy activities this date.  Patient continued to progress with postural, core, lower extremity strengthening exercises this session.  Patient progressed with several exercises but did have difficulty with sequencing with higher level activities.  Patient also continued be educated regarding proper body mechanics for activities such as vacuuming and lifting.  Patient instructed on lifting and  patient demonstrated poor posture with this activity and patient educated thoroughly as well as provided with demonstration and practice and this did improve patient's posture with this activity.  We will continu

## 2021-05-31 ENCOUNTER — Encounter: Payer: Medicare Other | Admitting: Physical Therapy

## 2021-06-03 ENCOUNTER — Encounter: Payer: Medicare Other | Admitting: Physical Therapy

## 2021-06-03 ENCOUNTER — Ambulatory Visit: Payer: Medicare Other

## 2021-06-03 DIAGNOSIS — M5459 Other low back pain: Secondary | ICD-10-CM | POA: Diagnosis not present

## 2021-06-03 DIAGNOSIS — R2689 Other abnormalities of gait and mobility: Secondary | ICD-10-CM

## 2021-06-03 DIAGNOSIS — M6281 Muscle weakness (generalized): Secondary | ICD-10-CM

## 2021-06-03 NOTE — Therapy (Signed)
[place] ?[facility] ?[place], [name], [address] ?Phone: [phone]   Fax:  [phone] ? ?Physical Therapy Treatment ? ?Patient Details  ?Name: Charlene [name] ?MRN: [id number] ?Date of Birth: 31-Dec-1960 ?Referring Provider (PT): Milinda Pointer, MD ? ? ?Encounter Date: 06/03/2021 ? ? PT End of Session - 06/03/21 0824   ? ? Visit Number 7   ? Number of Visits 16   ? Date for PT Re-Evaluation 07/05/21   ? Progress Note Due on Visit 10   ? PT Start Time [phone]   ? PT Stop Time 0845   ? PT Time Calculation (min) 31 min   ? Activity Tolerance Patient tolerated treatment well;No increased pain   ? Behavior During Therapy [facility] for tasks assessed/performed   ? ?  ?  ? ?  ? ? ?Past Medical History:  ?Diagnosis Date  ? Arthritis   ? knees  ? Degenerative disc disease, lumbar   ? Diabetes mellitus without complication ([place])   ? GERD (gastroesophageal reflux disease)   ? Hypertension   ? Neuropathy   ? left lower leg  ? Polyneuropathy   ? Polyneuropathy   ? ? ?Past Surgical History:  ?Procedure Laterality Date  ? ABDOMINAL HYSTERECTOMY    ? arthroscopic rotator cuff    ? CATARACT EXTRACTION W/PHACO Right 11/25/2017  ? Procedure: CATARACT EXTRACTION PHACO AND INTRAOCULAR LENS PLACEMENT ([place]) RIGHT DIABETIC;  Surgeon: Leandrew Koyanagi, MD;  Location: [place] [facility];  Service: Ophthalmology;  Laterality: Right;  Diabetic - insulin and oral meds  ? COLONOSCOPY    ? endosopic sinus    ? ? ?There were no vitals filed for this visit. ? ? Subjective Assessment - 06/03/21 0815   ? ? Subjective Pt states, "I haven't really had much pain." Pt reports 0/10 pain.   ? Pertinent History Pt reports she has had back pain chronically and she has to get injections every 4-5 months for management of low back pain. Pt lives in [place] [employer] [facility] which is about a 45 min drive from clinic. Pt ambulates with cane to clinic and has been ambulating with cane for 3 years. Pt reports no  falls in the last 6 months but has had some incidences of loss of balance where she feels she is leaning posteriorlly. Pt reports her lower back pain is worse when she has to stand for prolonged periods but she reports she is able to walk for prolonged periods without back pain but has some pain in the legs with prolonged walking. Pt reports pain in the area of her low back that wraps following her weist line and no complaints of radiating pain down her legs. Pt back pain began around 5 years ago and was a gradual progression in pain and discomfort. Pt reports standing on hard floors for many years of working may have contributing. 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 Limitations Standing   ? How long can you sit comfortably? Unlimited   ? How long can  you stand comfortably? Pain following 20 minutes of standing and after 30 minutes she has to sit   ? How long can you walk comfortably? Not sure, able to walk to clinic, c/o leg pain and fatigue with prolonged walking   ? Currently in Pain? No/denies   ? Pain Onset More than a month ago   ? ?  ?  ? ?  ?  ?TREATMENT: ?Heat donned to low back- ?On mat table, hooklying: ? ?Hamstring stretch 30 sec B ? ?Feet apart windshield wipers x10 1 sec holds, x10 x 5 second holds bilat ? ?Piriformis stretch 2x30 sec each LE ? ?Single knee to chest stretch 30 sec hold x2 reps each LE. Pt reports every once  in a while feeling a "pulling" pain just inferior to last rib on R side that improves out of position. She reports this doesn't always happen. She plans to bring this up with her physician.  ? ?Pelvic tilts - Posterior pelvic tilts 5x with holds x 10 seconds, cues for " pulling in belly button"  ?  ?PPT with march 2 x 15 ea LE, cues for maintaining stability in transverse plane . ? ?Bridges, arms at side 2x15 denies any pain  ?  ?Dead bugs LE only x 10. Pt rates as medium  ? ?Dead bugs LE and UE x 10. Pt rates as medium.  ? ?Side-lying hip abduction 15 x each LE. Pt reports feeling a stretch, rates medium ? ?Seated hamstring stretch 30 sec bilat ? ? ?Pt educated throughout session about proper posture and technique with exercises. Improved exercise technique, movement at target joints, use of target muscles after min to mod verbal, visual, tactile cues. ?  ? ? PT Education - 06/03/21 0824   ? ? Education Details exercise technique   ? Person(s) Educated Patient   ? Methods Explanation;Demonstration;Tactile cues;Verbal cues   ? Comprehension Verbalized understanding;Returned demonstration;Need further instruction   ? ?  ?  ? ?  ? ? ? PT Short Term Goals - 05/10/21 1050   ? ?  ? PT SHORT TERM GOAL #1  ? Title Patient will be independent in home exercise program to improve strength/mobility for better functional independence with ADLs.    ? Baseline No low back HEP   ? Time 4   ? Period Weeks   ? Status New   ? Target Date 06/07/21   ? ?  ?  ? ?  ? ? ? ? PT Long Term Goals - 05/10/21 1054   ? ?  ? PT LONG TERM GOAL #1  ? Title Patient will reduce modified Oswestry score to 20% as to demonstrate minimal disability with ADLs including improved sleeping tolerance, walking/sitting tolerance etc for better mobility with ADLs.   ? Baseline 05/10/21: 20/50 / 40%   ? Time 8   ? Period Weeks   ? Status New   ? Target Date 07/05/21   ?  ? PT LONG TERM GOAL #2  ? Title Patient will increase FOTO score to equal to or greater than      to demonstrate statistically significant improvement in mobility and quality of life.   ? Time 8   ? Period Weeks   ? Status New   ? Target Date 07/05/21   ?  ? PT LONG TERM GOAL #3  ? Title Patient will improve bilateral hip strength to 4+ out of 5 or greater for hip abduction, flexion, and extension in order to improve hip  muscle strength and stability for prolonged standing   ? Baseline See initial evaluation   ? Time 8   ? Period Weeks   ? Status New   ? Target Date 07/05/21   ?  ? PT LONG TERM GOAL #4  ? Title Patient report ability to stand for 30 minutes or longer without reports of increased low back pain in order to improve her ability to perform cooking and other standing ADLs without pain and discomfort   ? Baseline Able to stand 20 minutes prior to onset of low back pain   ? Time 8   ? Period Weeks   ? Status New   ? Target Date 07/05/21   ? ?  ?  ? ?  ? ? ? ? ? ? ? ? Plan - 06/03/21 0835   ? ? Clinical Impression Statement Session limited secondary to pt late arrival, however, pt highly motivated to participate. She shows progress with reporting 0/10 pain currently and outside of PT. Pt tolerates majority of interventions well, with exception of knee to chest stretch where she felt a pull-like pain just inferior R 12th rib and superior-lateral to navel. Pain resolved out of position. Will continue to monitor. The pt will benefit from further skilled PT to improve pain, strength, and mobility to increase QOL.   ? Personal Factors and Comorbidities Comorbidity 3+;Time since onset of injury/illness/exacerbation   ? Comorbidities DM type II, severe diabetic neuropathy, HTN, arthritis, DDD, GERD, brain tumor   ? Examination-Activity Limitations Bend;Caring for Others;Carry;Reach Overhead;Squat;Stairs;Stand;Transfers   ? Examination-Participation Restrictions Cleaning;Community Activity;Meal Prep;Valla Leaver Work   ? Stability/Clinical Decision Making Evolving/Moderate complexity   ? Rehab Potential Fair   ?  Clinical Impairments Affecting Rehab Potential medical comorbidities, chronicity   ? PT Frequency 2x / week   ? PT Duration 8 weeks   ? PT Treatment/Interventions ADLs/Self Care Home Management;Aquatic The

## 2021-06-05 ENCOUNTER — Ambulatory Visit: Payer: Medicare Other

## 2021-06-05 DIAGNOSIS — R2689 Other abnormalities of gait and mobility: Secondary | ICD-10-CM

## 2021-06-05 DIAGNOSIS — R278 Other lack of coordination: Secondary | ICD-10-CM

## 2021-06-05 DIAGNOSIS — M5459 Other low back pain: Secondary | ICD-10-CM | POA: Diagnosis not present

## 2021-06-05 DIAGNOSIS — R293 Abnormal posture: Secondary | ICD-10-CM

## 2021-06-05 DIAGNOSIS — M6281 Muscle weakness (generalized): Secondary | ICD-10-CM

## 2021-06-05 NOTE — Therapy (Signed)
[place] ?[facility] ?[place], [name], [address] ?Phone: [phone]   Fax:  [phone] ? ?Physical Therapy Treatment ? ?Patient Details  ?Name: Charlene [name] ?MRN: [id number] ?Date of Birth: 02-24-1960 ?Referring Provider (PT): Milinda Pointer, MD ? ? ?Encounter Date: 06/05/2021 ? ? PT End of Session - 06/05/21 0848   ? ? Visit Number 8   ? Number of Visits 16   ? Date for PT Re-Evaluation 07/05/21   ? Progress Note Due on Visit 10   ? PT Start Time [phone]   ? PT Stop Time 0845   ? PT Time Calculation (min) 42 min   ? Activity Tolerance Patient tolerated treatment well;No increased pain   ? Behavior During Therapy [facility] for tasks assessed/performed   ? ?  ?  ? ?  ? ? ?Past Medical History:  ?Diagnosis Date  ? Arthritis   ? knees  ? Degenerative disc disease, lumbar   ? Diabetes mellitus without complication ([place])   ? GERD (gastroesophageal reflux disease)   ? Hypertension   ? Neuropathy   ? left lower leg  ? Polyneuropathy   ? Polyneuropathy   ? ? ?Past Surgical History:  ?Procedure Laterality Date  ? ABDOMINAL HYSTERECTOMY    ? arthroscopic rotator cuff    ? CATARACT EXTRACTION W/PHACO Right 11/25/2017  ? Procedure: CATARACT EXTRACTION PHACO AND INTRAOCULAR LENS PLACEMENT ([place]) RIGHT DIABETIC;  Surgeon: Leandrew Koyanagi, MD;  Location: [facility];  Service: Ophthalmology;  Laterality: Right;  Diabetic - insulin and oral meds  ? COLONOSCOPY    ? endosopic sinus    ? ? ?There were no vitals filed for this visit. ? ? Subjective Assessment - 06/05/21 0857   ? ? Subjective Patient continues to report no back pain and states she did well after last visit.   ? Pertinent History Pt reports she has had back pain chronically and she has to get injections every 4-5 months for management of low back pain. Pt lives in [employer] which is about a 45 min drive from clinic. Pt ambulates with cane to clinic and has been ambulating with cane for 3 years.  Pt reports no falls in the last 6 months but has had some incidences of loss of balance where she feels she is leaning posteriorlly. Pt reports her lower back pain is worse when she has to stand for prolonged periods but she reports she is able to walk for prolonged periods without back pain but has some pain in the legs with prolonged walking. Pt reports pain in the area of her low back that wraps following her weist line and no complaints of radiating pain down her legs. Pt back pain began around 5 years ago and was a gradual progression in pain and discomfort. Pt reports standing on hard floors for many years of working may have contributing. 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 Limitations Standing   ? How long can you sit comfortably? Unlimited   ?  How long can you stand comfortably? Pain following 20 minutes of standing and after 30 minutes she has to sit   ? How long can you walk comfortably? Not sure, able to walk to clinic, c/o leg pain and fatigue with prolonged walking   ? Currently in Pain? No/denies   ? ?  ?  ? ?  ? ? ?INTERVENTIONS:  ? ? ? ? ? ? ?TREATMENT: ?Heat donned to low back- ?On mat table, hooklying: ?  ?Manual Therapy: for Low back pain relief and flexibility ? ?Hamstring stretch 30 sec BLE x 4 each ? ?Piriformis stretch 2x30 sec each LE ?  ?Single knee to chest stretch 30 sec hold x 3 reps each LE.  ? ?Lower trunk  rotation B LE x 20 sec hold x 3 each side ? ?Hip circles - CW/CCW- x 20 reps each direction ? ? ?Core stabilization  ?Pelvic tilts - Posterior pelvic tilts 5x with holds x 10 seconds ?  ?Instructed in Transverse Abdominal brace with 5 sec hold each leg and towel under low back to ensure maintain neutral lumbar curve. ?  ?Bridges, arms at side x 10 reps denies any pain  ?  ?Dead bugs LE only x 10. Pt rates as easy ?  ?Dead bugs LE and UE x 10. Pt rates as medium.  ?  ?Side-lying clamshell (no weight today)  with TA contraction x 10 each LE.  ?  ?Seated lumbar flex stretch using green theraball x 12 reps- No pain reported.  ?  ?  ?Pt educated throughout session about proper posture and technique with exercises. Improved exercise technique, movement at target joints, use of target muscles after min to mod verbal, visual, tactile cues. ?  ?  ? ? ? ? ? ? ? ? ? ? ? ? ? ? ? ? ? ? ? ? ? PT Education - 06/05/21 0857   ? ? Education Details Core stabilization technique exercises   ? Person(s) Educated Patient   ? Methods Explanation;Demonstration;Tactile cues;Verbal cues   ? Comprehension Verbalized understanding;Returned demonstration;Verbal cues required;Tactile cues required;Need further instruction   ? ?  ?  ? ?  ? ? ? PT Short Term Goals - 05/10/21 1050   ? ?  ? PT SHORT TERM GOAL #1  ? Title Patient will be independent in home exercise program to improve strength/mobility for better functional independence with ADLs.    ? Baseline No low back HEP   ? Time 4   ? Period Weeks   ? Status New   ? Target Date 06/07/21   ? ?  ?  ? ?  ? ? ? ? PT Long Term Goals - 05/10/21 1054   ? ?  ? PT LONG TERM GOAL #1  ? Title Patient will reduce modified Oswestry score to 20% as to demonstrate minimal disability with ADLs including improved sleeping tolerance, walking/sitting tolerance etc for better mobility with ADLs.   ? Baseline 05/10/21: 20/50 / 40%   ? Time 8   ? Period Weeks   ? Status New   ? Target Date 07/05/21   ?  ? PT LONG  TERM GOAL #2  ? Title Patient will increase FOTO score to equal to or greater than     to demonstrate statistically significant improvement in mobility and quality of life.   ? Time 8   ? Period Weeks   ? Status New   ? Target Date 07/05/21   ?  ? PT  LONG TERM GOAL #3  ? Title Patient will improve bilateral hip strength to 4+ out of 5 or greater for hip abduction, flexion, and extension in order to improve hip muscle strength and stability for prolonged standing   ? Baseline See initial evaluation   ? Time 8   ? Period Weeks   ? Status New   ? Target Date 07/05/21   ?  ? PT LONG TERM GOAL #4  ? Title Patient report ability to stand for 30 minutes or longer without reports of increased low back pain in order to improve her ability to perform cooking and other standing ADLs without pain and discomfort   ? Baseline Able to stand 20 minutes prior to onset of low back pain   ? Time 8   ? Period Weeks   ? Status New   ? Target Date 07/05/21   ? ?  ?  ? ?  ? ? ? ? ? ? ? ? Plan - 06/05/21 0849   ? ? Clinical Impression Statement Patient presents with excellent motivation and no report of pain pre or post treatment. Patient experienced only "tightness" sensation with manual stretching and able to return demonstration well with all core activities today. The pt will benefit from further skilled PT to improve pain, strength, and mobility to increase QOL.   ? Personal Factors and Comorbidities Comorbidity 3+;Time since onset of injury/illness/exacerbation   ? Comorbidities DM type II, severe diabetic neuropathy, HTN, arthritis, DDD, GERD, brain tumor   ? Examination-Activity Limitations Bend;Caring for Others;Carry;Reach Overhead;Squat;Stairs;Stand;Transfers   ? Examination-Participation Restrictions Cleaning;Community Activity;Meal Prep;Valla Leaver Work   ? Stability/Clinical Decision Making Evolving/Moderate complexity   ? Rehab Potential Fair   ? Clinical Impairments Affecting Rehab Potential medical comorbidities, chronicity   ?  PT Frequency 2x / week   ? PT Duration 8 weeks   ? PT Treatment/Interventions ADLs/Self Care Home Management;Aquatic Therapy;Biofeedback;Cryotherapy;Electrical Stimulation;Iontophoresis '4mg'$ /ml Dexamethas

## 2021-06-06 ENCOUNTER — Encounter: Payer: Medicare Other | Admitting: Physical Therapy

## 2021-06-10 ENCOUNTER — Ambulatory Visit: Payer: Medicare Other

## 2021-06-13 ENCOUNTER — Ambulatory Visit: Payer: Medicare Other | Admitting: Physical Therapy

## 2021-06-13 ENCOUNTER — Encounter: Payer: Self-pay | Admitting: Physical Therapy

## 2021-06-13 DIAGNOSIS — M5459 Other low back pain: Secondary | ICD-10-CM | POA: Diagnosis not present

## 2021-06-13 DIAGNOSIS — R293 Abnormal posture: Secondary | ICD-10-CM

## 2021-06-13 DIAGNOSIS — R278 Other lack of coordination: Secondary | ICD-10-CM

## 2021-06-13 DIAGNOSIS — M6281 Muscle weakness (generalized): Secondary | ICD-10-CM

## 2021-06-13 DIAGNOSIS — R2689 Other abnormalities of gait and mobility: Secondary | ICD-10-CM

## 2021-06-13 NOTE — Therapy (Signed)
[place] [facility] SERVICES [address] [place], [name], [address] Phone: [phone]   Fax:  [phone]  Physical Therapy Treatment  Patient Details  Name: Charlene [name] MRN: [id number] Date of Birth: 06-05-1960 Referring Provider (PT): Milinda Pointer, MD   Encounter Date: 06/13/2021   PT End of Session - 06/13/21 0810     Visit Number 9    Number of Visits 16    Date for PT Re-Evaluation 07/05/21    Progress Note Due on Visit 10    PT Start Time 0805    PT Stop Time 0845    PT Time Calculation (min) 40 min    Activity Tolerance Patient tolerated treatment well;No increased pain    Behavior During Therapy WFL for tasks assessed/performed             Past Medical History:  Diagnosis Date   Arthritis    knees   Degenerative disc disease, lumbar    Diabetes mellitus without complication (HCC)    GERD (gastroesophageal reflux disease)    Hypertension    Neuropathy    left lower leg   Polyneuropathy    Polyneuropathy     Past Surgical History:  Procedure Laterality Date   ABDOMINAL HYSTERECTOMY     arthroscopic rotator cuff     CATARACT EXTRACTION W/PHACO Right 11/25/2017   Procedure: CATARACT EXTRACTION PHACO AND INTRAOCULAR LENS PLACEMENT ([place]) RIGHT DIABETIC;  Surgeon: Leandrew Koyanagi, MD;  Location: [facility];  Service: Ophthalmology;  Laterality: Right;  Diabetic - insulin and oral meds   COLONOSCOPY     endosopic sinus      There were no vitals filed for this visit.   Subjective Assessment - 06/13/21 0808     Subjective Patient  reports mild soreness in low back this morning. She reports being busy taking grandkids to various sports events.    Pertinent History Pt reports she has had back pain chronically and she has to get injections every 4-5 months for management of low back pain. Pt lives in [place] which is about a 45 min drive from clinic. Pt ambulates with cane to clinic and has  been ambulating with cane for 3 years. Pt reports no falls in the last 6 months but has had some incidences of loss of balance where she feels she is leaning posteriorlly. Pt reports her lower back pain is worse when she has to stand for prolonged periods but she reports she is able to walk for prolonged periods without back pain but has some pain in the legs with prolonged walking. Pt reports pain in the area of her low back that wraps following her weist line and no complaints of radiating pain down her legs. Pt back pain began around 5 years ago and was a gradual progression in pain and discomfort. Pt reports standing on hard floors for many years of working may have contributing. 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 Per last MD visit pertintant diagnoses includeIndications: Low back pain severe enough to impact quality of life or function, Lumbar facet syndrome, Lumbar facet hypertrophy, Spondylosis without myelopathy or radiculopathy, lumbosacral region, 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), Lumbar facet arthropathy, DDD (degenerative disc disease), lumbar, Chronic low back pain (Bilateral) w/o sciatica    Limitations Standing    How long can you sit  comfortably? Unlimited    How long can you stand comfortably? Pain following 20 minutes of standing and after 30 minutes she has to sit    How long can you walk comfortably? Not sure, able to walk to clinic, c/o leg pain and fatigue with prolonged walking    Currently in Pain? Yes    Pain Score 1     Pain Location Back    Pain Orientation Lower    Pain Descriptors / Indicators Aching;Sore    Pain Type Chronic pain    Pain Onset More than a month ago    Pain Frequency Constant    Aggravating Factors  prolonged standing/housework    Pain Relieving Factors  sitting/rest/muscle relaxer    Effect of Pain on Daily Activities decreased housework tolerance;    Multiple Pain Sites No                TREATMENT: Hooklying with moist heat to low back concurrent with exercise;  Hooklying: Feet apart windshield wipers x10 reps Single knee to chest stretch 20 sec hold x2 reps each LE Hip circles clockwise/counterclockwise x5 reps each; PT performed passive hamstring stretch 30 sec hold x2 reps  Progressed with ankle DF/PF for neural glide x10 reps each LE PT performed passive SLR with hip abduction for adductor stretch with knee flexed/extended 5 sec hold x3 reps each LE;   Posterior pelvic tilt 5 sec hold x10 reps, min VCs and demonstration for proper exercise technique.   Progressed with alternate march with green tband around BLE x10 reps each LE with cues for increased core stabilization to avoid strain on low back;   BLE hip abduction/ER green tband x15 reps  Bridges with arms by side with green tband around BLE and legs apart to increase gluteal activation x15 reps with good control and positioning; denies any increase in pain with bridge,                Hooklying with pball on legs, gentle curl up pushing down into pball for increased abdominal activation 5 sec hold x10 reps;   Instructed patient in advanced lower abdominal activation with BLE leg lift hip/knee flexed at 90 degrees 5 sec hold x5 reps; Required min VCs for proper exercise technique;  Finished with LE stretches: Hooklying: Feet apart windshield wipers x10 reps Single knee to chest stretch 20 sec hold x2 reps each LE Piriformis stretch 20 sec hold modified x2 reps each LE;   Patient tolerated session well. She reports less stiffness and no pain at end of session;                           PT Education - 06/13/21 0810     Education Details core strengthening/exercise technique    Person(s) Educated Patient    Methods Explanation;Verbal cues     Comprehension Verbalized understanding;Returned demonstration;Verbal cues required;Need further instruction              PT Short Term Goals - 05/10/21 1050       PT SHORT TERM GOAL #1   Title Patient will be independent in home exercise program to improve strength/mobility for better functional independence with ADLs.     Baseline No low back HEP    Time 4    Period Weeks    Status New    Target Date 06/07/21               PT Long  Term Goals - 05/10/21 1054       PT LONG TERM GOAL #1   Title Patient will reduce modified Oswestry score to 20% as to demonstrate minimal disability with ADLs including improved sleeping tolerance, walking/sitting tolerance etc for better mobility with ADLs.    Baseline 05/10/21: 20/50 / 40%    Time 8    Period Weeks    Status New    Target Date 07/05/21      PT LONG TERM GOAL #2   Title Patient will increase FOTO score to equal to or greater than     to demonstrate statistically significant improvement in mobility and quality of life.    Time 8    Period Weeks    Status New    Target Date 07/05/21      PT LONG TERM GOAL #3   Title Patient will improve bilateral hip strength to 4+ out of 5 or greater for hip abduction, flexion, and extension in order to improve hip muscle strength and stability for prolonged standing    Baseline See initial evaluation    Time 8    Period Weeks    Status New    Target Date 07/05/21      PT LONG TERM GOAL #4   Title Patient report ability to stand for 30 minutes or longer without reports of increased low back pain in order to improve her ability to perform cooking and other standing ADLs without pain and discomfort    Baseline Able to stand 20 minutes prior to onset of low back pain    Time 8    Period Weeks    Status New    Target Date 07/05/21                   Plan - 06/13/21 2979     Clinical Impression Statement Patient motivated and participated well within session. She was  instructed in advanced core strengthening with LE movement with cues for core stabilization. Patient denies any increase in back pain with advanced exercise. She does require min VCs for proper exercise technique. Patient tolerated stretches well reporting less stiffness at end of session. She reports adherence with HEP and states she has been doing exercises daily. Patient would benefit from additional skilled PT Intervention to improve strength and mobility while reducing back pain;    Personal Factors and Comorbidities Comorbidity 3+;Time since onset of injury/illness/exacerbation    Comorbidities DM type II, severe diabetic neuropathy, HTN, arthritis, DDD, GERD, brain tumor    Examination-Activity Limitations Bend;Caring for Others;Carry;Reach Overhead;Squat;Stairs;Stand;Transfers    Examination-Participation Restrictions Cleaning;Community Activity;Meal Prep;Yard Work    [employer] Evolving/Moderate complexity    Rehab Potential Fair    Clinical Impairments Affecting Rehab Potential medical comorbidities, chronicity    PT Frequency 2x / week    PT Duration 8 weeks    PT Treatment/Interventions ADLs/Self Care Home Management;Aquatic Therapy;Biofeedback;Cryotherapy;Electrical Stimulation;Iontophoresis '4mg'$ /ml Dexamethasone;Moist Heat;Traction;Ultrasound;DME Instruction;Gait training;Stair training;Functional mobility training;Therapeutic activities;Therapeutic exercise;Balance training;Neuromuscular re-education;Patient/family education;Cognitive remediation;Orthotic Fit/Training;Manual techniques;Passive range of motion;Compression bandaging;Energy conservation;Splinting;Taping;Visual/perceptual remediation/compensation;Dry needling;Spinal Manipulations    PT Next Visit Plan Initiate physical therapy interventions as well as provide home exercise program including low back stretches which may include piriformis stretch, knee-to-chest stretch, and hip strengthening exercises.  continue plan    PT Home Exercise Plan no updates    Consulted and Agree with Plan of Care Patient             Patient will benefit from skilled therapeutic intervention  in order to improve the following deficits and impairments:  Abnormal gait, Decreased activity tolerance, Decreased endurance, Decreased mobility, Decreased strength, Decreased range of motion, Improper body mechanics, Postural dysfunction, Pain, Impaired flexibility, Impaired perceived functional ability, Decreased balance  Visit Diagnosis: Muscle weakness (generalized)  Other low back pain  Other abnormalities of gait and mobility  Posture abnormality  Other lack of coordination     Problem List Patient Active Problem List   Diagnosis Date Noted   Chronic sacroiliac joint pain (Right) 03/11/2021   Diabetic retinopathy ([place]) 12/17/2020   Hypertensive retinopathy of both eyes 12/17/2020   NPDR (nonproliferative diabetic retinopathy) ([place]) 12/17/2020   Pseudophakia of right eye 12/17/2020   Disease of thyroid gland 12/17/2020   Abnormal drug screen (09/03/2020) 12/06/2020   Pain medication agreement broken (12/06/2020) 12/06/2020   History of cocaine use (09/03/2020) 63/14/9702   History of illicit drug use (6/37/8588) 12/06/2020   Substance use disorder 12/06/2020   Spondylosis without myelopathy or radiculopathy, lumbosacral region 10/09/2020   Chronic low back pain (Bilateral) w/o sciatica 10/09/2020   Chronic ankle and foot pain (1ry area of Pain) (Left) 09/03/2020   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) 09/03/2020   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) 09/03/2020   Chronic use of opiate for therapeutic purpose 07/08/2020   Pain medication agreement signed 11/08/2019   Meningioma ([place]) 11/01/2019   Hx of resection of meningioma 09/14/2019   Osteoporosis 08/05/2019   Neck pain 03/11/2019   Knee pain 11/18/2018   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)  10/06/2018   Osteoarthritis involving multiple joints 08/04/2018   Type 2 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, with long-term current use of insulin ([place]) 03/22/2018   Abnormal EMG (07/06/2017) 11/23/2017   Long-term insulin use ([place]) 11/10/2017   Uncontrolled type 2 diabetes mellitus with hyperglycemia ([place]) 11/10/2017   Vitamin D deficiency 10/21/2017   DM type 2 with diabetic peripheral neuropathy ([place]) 10/21/2017   DDD (degenerative disc disease), lumbar 10/21/2017   Lumbar facet arthropathy 10/21/2017   Lumbar facet syndrome 10/21/2017   Lumbar facet hypertrophy 10/21/2017   Lumbar foraminal stenosis (L5-S1) (Right) 10/21/2017   Osteoarthritis of knee (Bilateral) 10/21/2017   Chronic musculoskeletal pain 10/21/2017   Arthropathy of lumbar facet joint 10/21/2017   Essential hypertension 10/12/2017   Chronic ankle pain (Left) 10/12/2017   Chronic foot pain (Left) 10/12/2017   Chronic lower extremity pain (2ry area of Pain) (Bilateral) (L>R) 10/12/2017   Chronic knee pain (4th area of Pain) (Bilateral) (L>R) 10/12/2017   Chronic low back pain (3ry area of Pain) (Bilateral) (L>R) w/ sciatica (Left) 10/12/2017   Chronic pain syndrome 10/12/2017   Opiate use 10/12/2017   Pharmacologic therapy 10/12/2017   Disorder of skeletal system 10/12/2017   Problems influencing health status 10/12/2017   Chronic pain disorder 10/12/2017   Neuropathy 08/11/2017   Burning sensation of feet 07/06/2017   Lower extremity numbness and tingling (Left) 07/06/2017   Left leg weakness 07/06/2017   Pain in both lower extremities 06/24/2017   Polyneuropathy associated with underlying disease ([place]) 05/29/2017   DM type 2, uncontrolled, with neuropathy 05/05/2017   Back pain with left-sided sciatica 10/16/2015   Meralgia paraesthetica, left 10/16/2015   Neuropathic pain 06/29/2015   GERD without esophagitis 01/16/2014   Diabetes mellitus  ([place]) 01/21/2003    Jenilyn Magana, PT, DPT 06/13/2021, 8:43 AM  [facility] [address]  [place], [name], [address] Phone: [phone]   Fax:  [phone]  Name: Charlene [name] MRN: [id number] Date of Birth: 02-23-1960

## 2021-06-18 ENCOUNTER — Ambulatory Visit: Payer: Medicare Other | Admitting: Physical Therapy

## 2021-06-18 ENCOUNTER — Encounter: Payer: Medicare Other | Admitting: Physical Therapy

## 2021-06-18 ENCOUNTER — Encounter: Payer: Self-pay | Admitting: Physical Therapy

## 2021-06-18 DIAGNOSIS — R293 Abnormal posture: Secondary | ICD-10-CM

## 2021-06-18 DIAGNOSIS — R2689 Other abnormalities of gait and mobility: Secondary | ICD-10-CM

## 2021-06-18 DIAGNOSIS — R278 Other lack of coordination: Secondary | ICD-10-CM

## 2021-06-18 DIAGNOSIS — M6281 Muscle weakness (generalized): Secondary | ICD-10-CM

## 2021-06-18 DIAGNOSIS — M5459 Other low back pain: Secondary | ICD-10-CM

## 2021-06-18 NOTE — Therapy (Signed)
[place] MAIN [facility] SERVICES [address] [place], [name], [address] Phone: [phone]   Fax:  [phone]  Physical Therapy Treatment Physical Therapy Progress Note   Dates of reporting period  05/10/21   to   06/18/21   Patient Details  Name: Charlene [name] MRN: [id number] Date of Birth: 12-05-1960 Referring Provider (PT): Milinda Pointer, MD   Encounter Date: 06/18/2021   PT End of Session - 06/18/21 0840     Visit Number 10    Number of Visits 16    Date for PT Re-Evaluation 07/05/21    Progress Note Due on Visit 10    PT Start Time 0843    PT Stop Time 0928    PT Time Calculation (min) 45 min    Activity Tolerance Patient tolerated treatment well;No increased pain    Behavior During Therapy WFL for tasks assessed/performed             Past Medical History:  Diagnosis Date   Arthritis    knees   Degenerative disc disease, lumbar    Diabetes mellitus without complication (HCC)    GERD (gastroesophageal reflux disease)    Hypertension    Neuropathy    left lower leg   Polyneuropathy    Polyneuropathy     Past Surgical History:  Procedure Laterality Date   ABDOMINAL HYSTERECTOMY     arthroscopic rotator cuff     CATARACT EXTRACTION W/PHACO Right 11/25/2017   Procedure: CATARACT EXTRACTION PHACO AND INTRAOCULAR LENS PLACEMENT ([place]) RIGHT DIABETIC;  Surgeon: Leandrew Koyanagi, MD;  Location: [facility];  Service: Ophthalmology;  Laterality: Right;  Diabetic - insulin and oral meds   COLONOSCOPY     endosopic sinus      There were no vitals filed for this visit.   Subjective Assessment - 06/18/21 0847     Subjective Patient reports increased soreness on Saturday with pain in low back and inner thigh pinch. She reports cleaning her grandsons room over the weekend and thinks she overdid it.    Pertinent History Pt reports she has had back pain chronically and she has to get injections every 4-5 months  for management of low back pain. Pt lives in [place] which is about a 45 min drive from clinic. Pt ambulates with cane to clinic and has been ambulating with cane for 3 years. Pt reports no falls in the last 6 months but has had some incidences of loss of balance where she feels she is leaning posteriorlly. Pt reports her lower back pain is worse when she has to stand for prolonged periods but she reports she is able to walk for prolonged periods without back pain but has some pain in the legs with prolonged walking. Pt reports pain in the area of her low back that wraps following her weist line and no complaints of radiating pain down her legs. Pt back pain began around 5 years ago and was a gradual progression in pain and discomfort. Pt reports standing on hard floors for many years of working may have contributing. 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 Per last MD visit pertintant diagnoses includeIndications: Low back pain severe enough to impact quality of life or function, Lumbar facet syndrome, Lumbar facet hypertrophy, Spondylosis without myelopathy or radiculopathy, lumbosacral region, 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),  Lumbar facet arthropathy, DDD (degenerative disc disease), lumbar, Chronic low back pain (Bilateral) w/o sciatica    Limitations Standing    How long can you sit comfortably? Unlimited    How long can you stand comfortably? Pain following 20 minutes of standing and after 30 minutes she has to sit    How long can you walk comfortably? Not sure, able to walk to clinic, c/o leg pain and fatigue with prolonged walking    Currently in Pain? Yes    Pain Score 2     Pain Location Back    Pain Orientation Lower    Pain Descriptors / Indicators Aching;Sore    Pain Type Chronic pain    Pain Onset  More than a month ago    Pain Frequency Constant    Aggravating Factors  prolonged standing/housework    Pain Relieving Factors sitting/rest/muscle relaxer    Effect of Pain on Daily Activities decreased housework tolerance;    Multiple Pain Sites No                [facility] PT Assessment - 06/18/21 0001       Observation/Other Assessments   Focus on Therapeutic Outcomes (FOTO)  53% (improved from 48%)    Oswestry Disability Index  20%   improved from 40% on 05/10/21              TREATMENT: Hooklying with moist heat to low back concurrent with exercise;  Hooklying: Feet apart windshield wipers x10 reps Single knee to chest stretch 20 sec hold x2 reps each LE PT performed passive hamstring stretch 30 sec hold x2 reps             Progressed with ankle DF/PF for neural glide x10 reps each LE PT performed passive SLR with hip abduction for adductor stretch with knee flexed/extended 5 sec hold x3 reps each LE;    Posterior pelvic tilt 5 sec hold x10 reps, min VCs and demonstration for proper exercise technique.               PT assessed progress towards goals, see above/below:   STRENGTH:  Graded on a 0-5 scale- today (06/18/21) Muscle Group Left Right                                            Hip Flex 4 4+  Hip Abd 4 4+  Hip Add  4  4  Hip Ext 4 4-         Knee Flex 4 4+  Knee Ext 4 4+                  STRENGTH:  Graded on a 0-5 scale on 05/10/21 Muscle Group Left Right                                            Hip Flex 4- 4  Hip Abd 4- 4  Hip Add      Hip Ext 4- 4         Knee Flex 4 4+  Knee Ext 4 4+                  Standing with red tband around BLE: -hip abduction x10 reps -hip extension x10  reps Side stepping x10 feet x2 laps each direction;  Patient required min-moderate verbal/tactile cues for correct exercise technique including to increase core stabilization for less back discomfort;    Patient tolerated session well. She reports  less stiffness. She does report slight pain of 1/10 in low back after doing standing resisted exercise;   Patient's condition has the potential to improve in response to therapy. Maximum improvement is yet to be obtained. The anticipated improvement is attainable and reasonable in a generally predictable time.  Patient reports adherence with HEP and states overall her pain is better.             PT Education - 06/18/21 0840     Education Details core strengthening/progress towards goals;    Person(s) Educated Patient    Methods Explanation;Verbal cues    Comprehension Verbalized understanding;Returned demonstration;Verbal cues required;Need further instruction              PT Short Term Goals - 06/18/21 0903       PT SHORT TERM GOAL #1   Title Patient will be independent in home exercise program to improve strength/mobility for better functional independence with ADLs.     Baseline No low back HEP, 5/20: adherent, does them 2x a day    Time 4    Period Weeks    Status Achieved    Target Date 06/07/21               PT Long Term Goals - 06/18/21 0904       PT LONG TERM GOAL #1   Title Patient will reduce modified Oswestry score to 20% as to demonstrate minimal disability with ADLs including improved sleeping tolerance, walking/sitting tolerance etc for better mobility with ADLs.    Baseline 05/10/21: 20/50 / 40%, 5/30: 20%    Time 8    Period Weeks    Status Achieved    Target Date 07/05/21      PT LONG TERM GOAL #2   Title Patient will increase FOTO score to equal to or greater than  55%   to demonstrate statistically significant improvement in mobility and quality of life.    Baseline 5/30: 53%    Time 8    Period Weeks    Status Partially Met    Target Date 07/05/21      PT LONG TERM GOAL #3   Title Patient will improve bilateral hip strength to 4+ out of 5 or greater for hip abduction, flexion, and extension in order to improve hip muscle strength and  stability for prolonged standing    Baseline See initial evaluation, 5/30: see note    Time 8    Period Weeks    Status Partially Met    Target Date 07/05/21      PT LONG TERM GOAL #4   Title Patient report ability to stand for 30 minutes or longer without reports of increased low back pain in order to improve her ability to perform cooking and other standing ADLs without pain and discomfort    Baseline Able to stand 20 minutes prior to onset of low back pain, 5/30: able to stand 2 hours    Time 8    Period Weeks    Status Achieved    Target Date 07/05/21                   Plan - 06/18/21 1022     Clinical Impression Statement Patient motivated and participated well within  session. She was instructed in lumbar flexion exercise/LE stretches which she tolerated well. She reports adherence with HEP. Patient responds well to lumbar flexion exercise with reduction in pain. She was instructed in outcome measures to address progress towards goals. She exhibits improvement in pain management as evidenced with decreased modified oswestry score and improve FOTO survey. Patient continues to have some weakness in LLE compared to RLE although her strength has improved since starting PT. She would benefit from additional skiled PT Intervention to improve strength and mobility and reduce pain with ADLs.    Personal Factors and Comorbidities Comorbidity 3+;Time since onset of injury/illness/exacerbation    Comorbidities DM type II, severe diabetic neuropathy, HTN, arthritis, DDD, GERD, brain tumor    Examination-Activity Limitations Bend;Caring for Others;Carry;Reach Overhead;Squat;Stairs;Stand;Transfers    Examination-Participation Restrictions Cleaning;Community Activity;Meal Prep;Yard Work    [employer] Evolving/Moderate complexity    Rehab Potential Fair    Clinical Impairments Affecting Rehab Potential medical comorbidities, chronicity    PT Frequency 2x / week    PT  Duration 8 weeks    PT Treatment/Interventions ADLs/Self Care Home Management;Aquatic Therapy;Biofeedback;Cryotherapy;Electrical Stimulation;Iontophoresis 4mg /ml Dexamethasone;Moist Heat;Traction;Ultrasound;DME Instruction;Gait training;Stair training;Functional mobility training;Therapeutic activities;Therapeutic exercise;Balance training;Neuromuscular re-education;Patient/family education;Cognitive remediation;Orthotic Fit/Training;Manual techniques;Passive range of motion;Compression bandaging;Energy conservation;Splinting;Taping;Visual/perceptual remediation/compensation;Dry needling;Spinal Manipulations    PT Next Visit Plan Initiate physical therapy interventions as well as provide home exercise program including low back stretches which may include piriformis stretch, knee-to-chest stretch, and hip strengthening exercises. continue plan    PT Home Exercise Plan no updates    Consulted and Agree with Plan of Care Patient             Patient will benefit from skilled therapeutic intervention in order to improve the following deficits and impairments:  Abnormal gait, Decreased activity tolerance, Decreased endurance, Decreased mobility, Decreased strength, Decreased range of motion, Improper body mechanics, Postural dysfunction, Pain, Impaired flexibility, Impaired perceived functional ability, Decreased balance  Visit Diagnosis: Muscle weakness (generalized)  Other low back pain  Other abnormalities of gait and mobility  Posture abnormality  Other lack of coordination     Problem List Patient Active Problem List   Diagnosis Date Noted   Chronic sacroiliac joint pain (Right) 03/11/2021   Diabetic retinopathy ([place]) 12/17/2020   Hypertensive retinopathy of both eyes 12/17/2020   NPDR (nonproliferative diabetic retinopathy) ([place]) 12/17/2020   Pseudophakia of right eye 12/17/2020   Disease of thyroid gland 12/17/2020   Abnormal drug screen (09/03/2020) 12/06/2020   Pain  medication agreement broken (12/06/2020) 12/06/2020   History of cocaine use (09/03/2020) 21/11/7354   History of illicit drug use (07/20/4101) 12/06/2020   Substance use disorder 12/06/2020   Spondylosis without myelopathy or radiculopathy, lumbosacral region 10/09/2020   Chronic low back pain (Bilateral) w/o sciatica 10/09/2020   Chronic ankle and foot pain (1ry area of Pain) (Left) 09/03/2020   Diabetic sensory polyneuropathy ([place]) (by NCT) 09/03/2020   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) 09/03/2020   Chronic use of opiate for therapeutic purpose 07/08/2020   Pain medication agreement signed 11/08/2019   Meningioma ([place]) 11/01/2019   Hx of resection of meningioma 09/14/2019   Osteoporosis 08/05/2019   Neck pain 03/11/2019   Knee pain 11/18/2018   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[place]) 10/06/2018   Osteoarthritis involving multiple joints 08/04/2018   Type 2 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, with long-term current use of insulin ([place]) 03/22/2018   Abnormal EMG (07/06/2017) 11/23/2017  Long-term insulin use ([place]) 11/10/2017   Uncontrolled type 2 diabetes mellitus with hyperglycemia ([place]) 11/10/2017   Vitamin D deficiency 10/21/2017   DM type 2 with diabetic peripheral neuropathy ([place]) 10/21/2017   DDD (degenerative disc disease), lumbar 10/21/2017   Lumbar facet arthropathy 10/21/2017   Lumbar facet syndrome 10/21/2017   Lumbar facet hypertrophy 10/21/2017   Lumbar foraminal stenosis (L5-S1) (Right) 10/21/2017   Osteoarthritis of knee (Bilateral) 10/21/2017   Chronic musculoskeletal pain 10/21/2017   Arthropathy of lumbar facet joint 10/21/2017   Essential hypertension 10/12/2017   Chronic ankle pain (Left) 10/12/2017   Chronic foot pain (Left) 10/12/2017   Chronic lower extremity pain (2ry area of Pain) (Bilateral) (L>R) 10/12/2017   Chronic knee pain (4th area of Pain)  (Bilateral) (L>R) 10/12/2017   Chronic low back pain (3ry area of Pain) (Bilateral) (L>R) w/ sciatica (Left) 10/12/2017   Chronic pain syndrome 10/12/2017   Opiate use 10/12/2017   Pharmacologic therapy 10/12/2017   Disorder of skeletal system 10/12/2017   Problems influencing health status 10/12/2017   Chronic pain disorder 10/12/2017   Neuropathy 08/11/2017   Burning sensation of feet 07/06/2017   Lower extremity numbness and tingling (Left) 07/06/2017   Left leg weakness 07/06/2017   Pain in both lower extremities 06/24/2017   Polyneuropathy associated with underlying disease ([place]) 05/29/2017   DM type 2, uncontrolled, with neuropathy 05/05/2017   Back pain with left-sided sciatica 10/16/2015   Meralgia paraesthetica, left 10/16/2015   Neuropathic pain 06/29/2015   GERD without esophagitis 01/16/2014   Diabetes mellitus ([place]) 01/21/2003    Slayter Moorhouse, PT, DPT 06/18/2021, 10:46 AM  [place] MAIN [facility] SERVICES [place], [name], [address] Phone: [phone]   Fax:  [phone]  Name: ADRA SHEPLER MRN: [id number] Date of Birth: 1960-11-22

## 2021-06-20 ENCOUNTER — Encounter: Payer: Medicare Other | Admitting: Physical Therapy

## 2021-06-20 ENCOUNTER — Ambulatory Visit: Payer: Medicare Other | Admitting: Physical Therapy

## 2021-06-25 ENCOUNTER — Ambulatory Visit: Payer: Medicare Other | Attending: Pain Medicine | Admitting: Physical Therapy

## 2021-06-25 ENCOUNTER — Encounter: Payer: Medicare Other | Admitting: Physical Therapy

## 2021-06-25 ENCOUNTER — Encounter: Payer: Self-pay | Admitting: Physical Therapy

## 2021-06-25 DIAGNOSIS — R262 Difficulty in walking, not elsewhere classified: Secondary | ICD-10-CM | POA: Diagnosis present

## 2021-06-25 DIAGNOSIS — M6281 Muscle weakness (generalized): Secondary | ICD-10-CM | POA: Diagnosis present

## 2021-06-25 DIAGNOSIS — R278 Other lack of coordination: Secondary | ICD-10-CM | POA: Diagnosis present

## 2021-06-25 DIAGNOSIS — M5459 Other low back pain: Secondary | ICD-10-CM | POA: Insufficient documentation

## 2021-06-25 DIAGNOSIS — R2689 Other abnormalities of gait and mobility: Secondary | ICD-10-CM | POA: Insufficient documentation

## 2021-06-25 DIAGNOSIS — R293 Abnormal posture: Secondary | ICD-10-CM | POA: Diagnosis present

## 2021-06-25 NOTE — Therapy (Signed)
[place] MAIN [facility] SERVICES [address] [place], [name], [address] Phone: [phone]   Fax:  [phone]  Physical Therapy Treatment  Patient Details  Name: Charlene [name] MRN: [id number] Date of Birth: 11/25/1960 Referring Provider (PT): Milinda Pointer, MD   Encounter Date: 06/25/2021   PT End of Session - 06/25/21 0841     Visit Number 11    Number of Visits 16    Date for PT Re-Evaluation 07/05/21    Progress Note Due on Visit 10    PT Start Time 0843    PT Stop Time 0928    PT Time Calculation (min) 45 min    Activity Tolerance Patient tolerated treatment well;No increased pain    Behavior During Therapy WFL for tasks assessed/performed             Past Medical History:  Diagnosis Date   Arthritis    knees   Degenerative disc disease, lumbar    Diabetes mellitus without complication (HCC)    GERD (gastroesophageal reflux disease)    Hypertension    Neuropathy    left lower leg   Polyneuropathy    Polyneuropathy     Past Surgical History:  Procedure Laterality Date   ABDOMINAL HYSTERECTOMY     arthroscopic rotator cuff     CATARACT EXTRACTION W/PHACO Right 11/25/2017   Procedure: CATARACT EXTRACTION PHACO AND INTRAOCULAR LENS PLACEMENT ([place]) RIGHT DIABETIC;  Surgeon: Leandrew Koyanagi, MD;  Location: [place];  Service: Ophthalmology;  Laterality: Right;  Diabetic - insulin and oral meds   COLONOSCOPY     endosopic sinus      There were no vitals filed for this visit.   Subjective Assessment - 06/25/21 0847     Subjective Patient reports doing well. Denies any back pain. She reports her legs have felt weak/fatigued but no pain;    Pertinent History Pt reports she has had back pain chronically and she has to get injections every 4-5 months for management of low back pain. Pt lives in [place] which is about a 45 min drive from clinic. Pt ambulates with cane to clinic and has been  ambulating with cane for 3 years. Pt reports no falls in the last 6 months but has had some incidences of loss of balance where she feels she is leaning posteriorlly. Pt reports her lower back pain is worse when she has to stand for prolonged periods but she reports she is able to walk for prolonged periods without back pain but has some pain in the legs with prolonged walking. Pt reports pain in the area of her low back that wraps following her weist line and no complaints of radiating pain down her legs. Pt back pain began around 5 years ago and was a gradual progression in pain and discomfort. Pt reports standing on hard floors for many years of working may have contributing. 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 Per last MD visit pertintant diagnoses includeIndications: Low back pain severe enough to impact quality of life or function, Lumbar facet syndrome, Lumbar facet hypertrophy, Spondylosis without myelopathy or radiculopathy, lumbosacral region, 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), Lumbar facet arthropathy, DDD (degenerative disc disease), lumbar, Chronic low back pain (Bilateral) w/o sciatica    Limitations Standing    How long can you sit comfortably? Unlimited  How long can you stand comfortably? Pain following 20 minutes of standing and after 30 minutes she has to sit    How long can you walk comfortably? Not sure, able to walk to clinic, c/o leg pain and fatigue with prolonged walking    Currently in Pain? No/denies    Pain Onset More than a month ago    Multiple Pain Sites No                  TREATMENT: Warm up on Nustep BUE/ BLE level 2 x4 min with cues for steps per minute > 40 to challenge cardiovascular conditioning/strengthening;   Instructed patient in standing LE  strengthening: Standing with red tband around BLE: -hip abduction x10 reps -hip extension x10 reps Side stepping x10 feet x2 laps each direction;  Patient required min-moderate verbal/tactile cues for correct exercise technique including to increase core stabilization for less back discomfort;    Seated hamstring curl green tband x10 reps each LE; required min VCs to slow down LE Movement for better strengthening;   Standing in parallel bars: Forward/backward step over orange hurdle x10 reps unsupported  Side stepping over orange hurdle x10 reps each direction with intermittent rail assist;   Leg press: BLE 40# , 2x10 Required min VCs for proper positioning/exercise technique, reports minimal difficulty;    Patient tolerated session well. She reports less stiffness and no pain at end of session;                           PT Education - 06/25/21 0841     Education Details LE strengthening/positioning;    Person(s) Educated Patient    Methods Explanation;Verbal cues    Comprehension Verbalized understanding;Returned demonstration;Verbal cues required;Need further instruction              PT Short Term Goals - 06/18/21 0903       PT SHORT TERM GOAL #1   Title Patient will be independent in home exercise program to improve strength/mobility for better functional independence with ADLs.     Baseline No low back HEP, 5/20: adherent, does them 2x a day    Time 4    Period Weeks    Status Achieved    Target Date 06/07/21               PT Long Term Goals - 06/18/21 0904       PT LONG TERM GOAL #1   Title Patient will reduce modified Oswestry score to 20% as to demonstrate minimal disability with ADLs including improved sleeping tolerance, walking/sitting tolerance etc for better mobility with ADLs.    Baseline 05/10/21: 20/50 / 40%, 5/30: 20%    Time 8    Period Weeks    Status Achieved    Target Date 07/05/21      PT LONG TERM GOAL #2   Title  Patient will increase FOTO score to equal to or greater than  55%   to demonstrate statistically significant improvement in mobility and quality of life.    Baseline 5/30: 53%    Time 8    Period Weeks    Status Partially Met    Target Date 07/05/21      PT LONG TERM GOAL #3   Title Patient will improve bilateral hip strength to 4+ out of 5 or greater for hip abduction, flexion, and extension in order to improve hip muscle strength and stability for prolonged standing  Baseline See initial evaluation, 5/30: see note    Time 8    Period Weeks    Status Partially Met    Target Date 07/05/21      PT LONG TERM GOAL #4   Title Patient report ability to stand for 30 minutes or longer without reports of increased low back pain in order to improve her ability to perform cooking and other standing ADLs without pain and discomfort    Baseline Able to stand 20 minutes prior to onset of low back pain, 5/30: able to stand 2 hours    Time 8    Period Weeks    Status Achieved    Target Date 07/05/21                   Plan - 06/25/21 0915     Clinical Impression Statement Patient motivated and participated well within session. She denies any back pain this session. Instructed patient in advanced LE strengthening exercise. Utilized resistance band for strengthening. She does require intermittent rail assist when standing for better balance/stance control. Patient does fatigue with prolonged standing but denies any increase in pain; She requires min VCs for proper exercise technique. She would benefit from additional skilled PT intervention to improve strength and mobility while reducing  back pain;    Personal Factors and Comorbidities Comorbidity 3+;Time since onset of injury/illness/exacerbation    Comorbidities DM type II, severe diabetic neuropathy, HTN, arthritis, DDD, GERD, brain tumor    Examination-Activity Limitations Bend;Caring for Others;Carry;Reach  Overhead;Squat;Stairs;Stand;Transfers    Examination-Participation Restrictions Cleaning;Community Activity;Meal Prep;Yard Work    [employer] Evolving/Moderate complexity    Rehab Potential Fair    Clinical Impairments Affecting Rehab Potential medical comorbidities, chronicity    PT Frequency 2x / week    PT Duration 8 weeks    PT Treatment/Interventions ADLs/Self Care Home Management;Aquatic Therapy;Biofeedback;Cryotherapy;Electrical Stimulation;Iontophoresis 4mg /ml Dexamethasone;Moist Heat;Traction;Ultrasound;DME Instruction;Gait training;Stair training;Functional mobility training;Therapeutic activities;Therapeutic exercise;Balance training;Neuromuscular re-education;Patient/family education;Cognitive remediation;Orthotic Fit/Training;Manual techniques;Passive range of motion;Compression bandaging;Energy conservation;Splinting;Taping;Visual/perceptual remediation/compensation;Dry needling;Spinal Manipulations    PT Next Visit Plan Initiate physical therapy interventions as well as provide home exercise program including low back stretches which may include piriformis stretch, knee-to-chest stretch, and hip strengthening exercises. continue plan    PT Home Exercise Plan no updates    Consulted and Agree with Plan of Care Patient             Patient will benefit from skilled therapeutic intervention in order to improve the following deficits and impairments:  Abnormal gait, Decreased activity tolerance, Decreased endurance, Decreased mobility, Decreased strength, Decreased range of motion, Improper body mechanics, Postural dysfunction, Pain, Impaired flexibility, Impaired perceived functional ability, Decreased balance  Visit Diagnosis: Muscle weakness (generalized)  Other low back pain  Other abnormalities of gait and mobility  Posture abnormality  Other lack of coordination     Problem List Patient Active Problem List   Diagnosis Date Noted   Chronic  sacroiliac joint pain (Right) 03/11/2021   Diabetic retinopathy ([place]) 12/17/2020   Hypertensive retinopathy of both eyes 12/17/2020   NPDR (nonproliferative diabetic retinopathy) ([place]) 12/17/2020   Pseudophakia of right eye 12/17/2020   Disease of thyroid gland 12/17/2020   Abnormal drug screen (09/03/2020) 12/06/2020   Pain medication agreement broken (12/06/2020) 12/06/2020   History of cocaine use (09/03/2020) 59/29/2446   History of illicit drug use (2/86/3817) 12/06/2020   Substance use disorder 12/06/2020   Spondylosis without myelopathy or radiculopathy, lumbosacral region 10/09/2020   Chronic low back pain (  Bilateral) w/o sciatica 10/09/2020   Chronic ankle and foot pain (1ry area of Pain) (Left) 09/03/2020   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) 09/03/2020   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) 09/03/2020   Chronic use of opiate for therapeutic purpose 07/08/2020   Pain medication agreement signed 11/08/2019   Meningioma ([place]) 11/01/2019   Hx of resection of meningioma 09/14/2019   Osteoporosis 08/05/2019   Neck pain 03/11/2019   Knee pain 11/18/2018   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) 10/06/2018   Osteoarthritis involving multiple joints 08/04/2018   Type 2 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, with long-term current use of insulin ([place]) 03/22/2018   Abnormal EMG (07/06/2017) 11/23/2017   Long-term insulin use ([place]) 11/10/2017   Uncontrolled type 2 diabetes mellitus with hyperglycemia ([place]) 11/10/2017   Vitamin D deficiency 10/21/2017   DM type 2 with diabetic peripheral neuropathy ([place]) 10/21/2017   DDD (degenerative disc disease), lumbar 10/21/2017   Lumbar facet arthropathy 10/21/2017   Lumbar facet syndrome 10/21/2017   Lumbar facet hypertrophy 10/21/2017   Lumbar foraminal stenosis (L5-S1) (Right) 10/21/2017   Osteoarthritis of knee (Bilateral) 10/21/2017    Chronic musculoskeletal pain 10/21/2017   Arthropathy of lumbar facet joint 10/21/2017   Essential hypertension 10/12/2017   Chronic ankle pain (Left) 10/12/2017   Chronic foot pain (Left) 10/12/2017   Chronic lower extremity pain (2ry area of Pain) (Bilateral) (L>R) 10/12/2017   Chronic knee pain (4th area of Pain) (Bilateral) (L>R) 10/12/2017   Chronic low back pain (3ry area of Pain) (Bilateral) (L>R) w/ sciatica (Left) 10/12/2017   Chronic pain syndrome 10/12/2017   Opiate use 10/12/2017   Pharmacologic therapy 10/12/2017   Disorder of skeletal system 10/12/2017   Problems influencing health status 10/12/2017   Chronic pain disorder 10/12/2017   Neuropathy 08/11/2017   Burning sensation of feet 07/06/2017   Lower extremity numbness and tingling (Left) 07/06/2017   Left leg weakness 07/06/2017   Pain in both lower extremities 06/24/2017   Polyneuropathy associated with underlying disease ([place]) 05/29/2017   DM type 2, uncontrolled, with neuropathy 05/05/2017   Back pain with left-sided sciatica 10/16/2015   Meralgia paraesthetica, left 10/16/2015   Neuropathic pain 06/29/2015   GERD without esophagitis 01/16/2014   Diabetes mellitus ([place]) 01/21/2003    Zabdi Mis, PT, DPT 06/25/2021, 9:25 AM  [place] MAIN [facility] SERVICES [place], [name], [address] Phone: [phone]   Fax:  [phone]  Name: Charlene [name] MRN: [id number] Date of Birth: 31-Dec-1960

## 2021-06-27 ENCOUNTER — Encounter: Payer: Medicare Other | Admitting: Physical Therapy

## 2021-06-27 ENCOUNTER — Encounter: Payer: Self-pay | Admitting: Physical Therapy

## 2021-06-27 ENCOUNTER — Ambulatory Visit: Payer: Medicare Other | Admitting: Physical Therapy

## 2021-06-27 DIAGNOSIS — R2689 Other abnormalities of gait and mobility: Secondary | ICD-10-CM

## 2021-06-27 DIAGNOSIS — R293 Abnormal posture: Secondary | ICD-10-CM

## 2021-06-27 DIAGNOSIS — R278 Other lack of coordination: Secondary | ICD-10-CM

## 2021-06-27 DIAGNOSIS — M5459 Other low back pain: Secondary | ICD-10-CM

## 2021-06-27 DIAGNOSIS — M6281 Muscle weakness (generalized): Secondary | ICD-10-CM | POA: Diagnosis not present

## 2021-06-27 NOTE — Patient Instructions (Signed)
Access Code: [email or web address] URL: [email or web address] Date: 06/27/2021 Prepared by: Blanche East  Exercises - Standing Hip Extension with Resistance at Ankles and Counter Support  - 1 x daily - 5 x weekly - 2 sets - 10 reps - Standing Hip Abduction with Resistance at Ankles and Counter Support  - 1 x daily - 5 x weekly - 2 sets - 10 reps - Side Stepping with Resistance at Ankles and Counter Support  - 1 x daily - 5 x weekly - 2 sets - 5 reps - Lunge with Counter Support  - 1 x daily - 5 x weekly - 1 sets - 10 reps - Heel Raises with Counter Support  - 1 x daily - 5 x weekly - 2 sets - 10 reps

## 2021-06-27 NOTE — Therapy (Signed)
[place] MAIN [facility] SERVICES [address] [place], [name], [address] Phone: [phone]   Fax:  [phone]  Physical Therapy Treatment  Patient Details  Name: Charlene [name] MRN: [id number] Date of Birth: 10/12/1960 Referring Provider (PT): Milinda Pointer, MD   Encounter Date: 06/27/2021   PT End of Session - 06/27/21 1152     Visit Number 12    Number of Visits 16    Date for PT Re-Evaluation 07/05/21    Progress Note Due on Visit 10    PT Start Time 6314    PT Stop Time 1229    PT Time Calculation (min) 42 min    Activity Tolerance Patient tolerated treatment well;No increased pain    Behavior During Therapy WFL for tasks assessed/performed             Past Medical History:  Diagnosis Date   Arthritis    knees   Degenerative disc disease, lumbar    Diabetes mellitus without complication (HCC)    GERD (gastroesophageal reflux disease)    Hypertension    Neuropathy    left lower leg   Polyneuropathy    Polyneuropathy     Past Surgical History:  Procedure Laterality Date   ABDOMINAL HYSTERECTOMY     arthroscopic rotator cuff     CATARACT EXTRACTION W/PHACO Right 11/25/2017   Procedure: CATARACT EXTRACTION PHACO AND INTRAOCULAR LENS PLACEMENT ([place]) RIGHT DIABETIC;  Surgeon: Leandrew Koyanagi, MD;  Location: [facility];  Service: Ophthalmology;  Laterality: Right;  Diabetic - insulin and oral meds   COLONOSCOPY     endosopic sinus      There were no vitals filed for this visit.   Subjective Assessment - 06/27/21 1151     Subjective Patient reports doing well. She reports her back is doing good- not hurting. Her legs were a little sore after last session but better today. No lingering pain.    Pertinent History Pt reports she has had back pain chronically and she has to get injections every 4-5 months for management of low back pain. Pt lives in [place] which is about a 45 min drive from clinic. Pt  ambulates with cane to clinic and has been ambulating with cane for 3 years. Pt reports no falls in the last 6 months but has had some incidences of loss of balance where she feels she is leaning posteriorlly. Pt reports her lower back pain is worse when she has to stand for prolonged periods but she reports she is able to walk for prolonged periods without back pain but has some pain in the legs with prolonged walking. Pt reports pain in the area of her low back that wraps following her weist line and no complaints of radiating pain down her legs. Pt back pain began around 5 years ago and was a gradual progression in pain and discomfort. Pt reports standing on hard floors for many years of working may have contributing. 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 Per last MD visit pertintant diagnoses includeIndications: Low back pain severe enough to impact quality of life or function, Lumbar facet syndrome, Lumbar facet hypertrophy, Spondylosis without myelopathy or radiculopathy, lumbosacral region, 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), Lumbar facet arthropathy, DDD (degenerative disc disease), lumbar, Chronic low back pain (Bilateral) w/o sciatica    Limitations Standing  How long can you sit comfortably? Unlimited    How long can you stand comfortably? Pain following 20 minutes of standing and after 30 minutes she has to sit    How long can you walk comfortably? Not sure, able to walk to clinic, c/o leg pain and fatigue with prolonged walking    Currently in Pain? No/denies    Pain Onset More than a month ago    Multiple Pain Sites No                     TREATMENT: Warm up on Nustep BUE/ BLE level 2 x4 min with cues for steps per minute > 50 to challenge cardiovascular conditioning/strengthening;    Forward step ups on 6 inch step with BUE rail assist x10 reps each LE, no pain in the back   Instructed patient in standing LE strengthening: Standing with red tband around BLE: -hip abduction x10 reps -hip extension x10 reps Side stepping x10 feet x2 laps each direction;  Patient required min-moderate verbal/tactile cues for correct exercise technique including to increase core stabilization for less back discomfort;    Seated hamstring curl green tband 2x10 reps each LE; required min VCs to slow down LE Movement for better strengthening;   Standing: Forward lunge x10 reps each LE with BUE rail assist, no pain reported  Standing  BLE heel/toe raises x10 reps with cues for proper positioning; Patient tolerated well with good ankle ROM;   Standing in parallel bars: Side stepping over orange hurdle x10 reps each direction with intermittent rail assist;     Finished with rolling stick to LE x2 min each LE to help alleviate soreness and prevent tightness;  Patient tolerated session well. She reports less stiffness and no pain at end of session;  Advanced HEP- see patient instructions;                        PT Education - 06/27/21 1152     Education Details LE strengthening/positioning;    Person(s) Educated Patient    Methods Explanation;Verbal cues    Comprehension Verbalized understanding;Returned demonstration;Verbal cues required;Need further instruction              PT Short Term Goals - 06/18/21 0903       PT SHORT TERM GOAL #1   Title Patient will be independent in home exercise program to improve strength/mobility for better functional independence with ADLs.     Baseline No low back HEP, 5/20: adherent, does them 2x a day    Time 4    Period Weeks    Status Achieved    Target Date 06/07/21               PT Long Term Goals - 06/18/21 0904       PT LONG TERM GOAL #1   Title Patient will reduce modified Oswestry score to 20% as to  demonstrate minimal disability with ADLs including improved sleeping tolerance, walking/sitting tolerance etc for better mobility with ADLs.    Baseline 05/10/21: 20/50 / 40%, 5/30: 20%    Time 8    Period Weeks    Status Achieved    Target Date 07/05/21      PT LONG TERM GOAL #2   Title Patient will increase FOTO score to equal to or greater than  55%   to demonstrate statistically significant improvement in mobility and quality of life.    Baseline 5/30: 53%  Time 8    Period Weeks    Status Partially Met    Target Date 07/05/21      PT LONG TERM GOAL #3   Title Patient will improve bilateral hip strength to 4+ out of 5 or greater for hip abduction, flexion, and extension in order to improve hip muscle strength and stability for prolonged standing    Baseline See initial evaluation, 5/30: see note    Time 8    Period Weeks    Status Partially Met    Target Date 07/05/21      PT LONG TERM GOAL #4   Title Patient report ability to stand for 30 minutes or longer without reports of increased low back pain in order to improve her ability to perform cooking and other standing ADLs without pain and discomfort    Baseline Able to stand 20 minutes prior to onset of low back pain, 5/30: able to stand 2 hours    Time 8    Period Weeks    Status Achieved    Target Date 07/05/21                   Plan - 06/27/21 1224     Clinical Impression Statement Patient motivated and participated well within session. She was instructed in advanced LE strengthening. Advanced HEP with LE strengthening exercise. She does require min VCS to increase core stabilization with LE exercise for less back discomfort. She tolerated advanced exercise well. Patient denies any increase in pain/soreness. She would benefit from additional skilled PT Intervention to improve strength and mobility while reducing back pain;    Personal Factors and Comorbidities Comorbidity 3+;Time since onset of  injury/illness/exacerbation    Comorbidities DM type II, severe diabetic neuropathy, HTN, arthritis, DDD, GERD, brain tumor    Examination-Activity Limitations Bend;Caring for Others;Carry;Reach Overhead;Squat;Stairs;Stand;Transfers    Examination-Participation Restrictions Cleaning;Community Activity;Meal Prep;Yard Work    [employer] Evolving/Moderate complexity    Rehab Potential Fair    Clinical Impairments Affecting Rehab Potential medical comorbidities, chronicity    PT Frequency 2x / week    PT Duration 8 weeks    PT Treatment/Interventions ADLs/Self Care Home Management;Aquatic Therapy;Biofeedback;Cryotherapy;Electrical Stimulation;Iontophoresis 15m/ml Dexamethasone;Moist Heat;Traction;Ultrasound;DME Instruction;Gait training;Stair training;Functional mobility training;Therapeutic activities;Therapeutic exercise;Balance training;Neuromuscular re-education;Patient/family education;Cognitive remediation;Orthotic Fit/Training;Manual techniques;Passive range of motion;Compression bandaging;Energy conservation;Splinting;Taping;Visual/perceptual remediation/compensation;Dry needling;Spinal Manipulations    PT Next Visit Plan Initiate physical therapy interventions as well as provide home exercise program including low back stretches which may include piriformis stretch, knee-to-chest stretch, and hip strengthening exercises. continue plan    PT Home Exercise Plan no updates    Consulted and Agree with Plan of Care Patient             Patient will benefit from skilled therapeutic intervention in order to improve the following deficits and impairments:  Abnormal gait, Decreased activity tolerance, Decreased endurance, Decreased mobility, Decreased strength, Decreased range of motion, Improper body mechanics, Postural dysfunction, Pain, Impaired flexibility, Impaired perceived functional ability, Decreased balance  Visit Diagnosis: Muscle weakness (generalized)  Other  low back pain  Other abnormalities of gait and mobility  Posture abnormality  Other lack of coordination     Problem List Patient Active Problem List   Diagnosis Date Noted   Chronic sacroiliac joint pain (Right) 03/11/2021   Diabetic retinopathy ([place] 12/17/2020   Hypertensive retinopathy of both eyes 12/17/2020   NPDR (nonproliferative diabetic retinopathy) ([place] 12/17/2020   Pseudophakia of right eye 12/17/2020   Disease of thyroid gland 12/17/2020  Abnormal drug screen (09/03/2020) 12/06/2020   Pain medication agreement broken (12/06/2020) 12/06/2020   History of cocaine use (09/03/2020) 24/40/1027   History of illicit drug use (2/53/6644) 12/06/2020   Substance use disorder 12/06/2020   Spondylosis without myelopathy or radiculopathy, lumbosacral region 10/09/2020   Chronic low back pain (Bilateral) w/o sciatica 10/09/2020   Chronic ankle and foot pain (1ry area of Pain) (Left) 09/03/2020   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) 09/03/2020   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) 09/03/2020   Chronic use of opiate for therapeutic purpose 07/08/2020   Pain medication agreement signed 11/08/2019   Meningioma ([place]) 11/01/2019   Hx of resection of meningioma 09/14/2019   Osteoporosis 08/05/2019   Neck pain 03/11/2019   Knee pain 11/18/2018   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) 10/06/2018   Osteoarthritis involving multiple joints 08/04/2018   Type 2 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, with long-term current use of insulin (HCC) 03/22/2018   Abnormal EMG (07/06/2017) 11/23/2017   Long-term insulin use ([place]) 11/10/2017   Uncontrolled type 2 diabetes mellitus with hyperglycemia ([place]) 11/10/2017   Vitamin D deficiency 10/21/2017   DM type 2 with diabetic peripheral neuropathy ([place]) 10/21/2017   DDD (degenerative disc disease), lumbar 10/21/2017   Lumbar facet  arthropathy 10/21/2017   Lumbar facet syndrome 10/21/2017   Lumbar facet hypertrophy 10/21/2017   Lumbar foraminal stenosis (L5-S1) (Right) 10/21/2017   Osteoarthritis of knee (Bilateral) 10/21/2017   Chronic musculoskeletal pain 10/21/2017   Arthropathy of lumbar facet joint 10/21/2017   Essential hypertension 10/12/2017   Chronic ankle pain (Left) 10/12/2017   Chronic foot pain (Left) 10/12/2017   Chronic lower extremity pain (2ry area of Pain) (Bilateral) (L>R) 10/12/2017   Chronic knee pain (4th area of Pain) (Bilateral) (L>R) 10/12/2017   Chronic low back pain (3ry area of Pain) (Bilateral) (L>R) w/ sciatica (Left) 10/12/2017   Chronic pain syndrome 10/12/2017   Opiate use 10/12/2017   Pharmacologic therapy 10/12/2017   Disorder of skeletal system 10/12/2017   Problems influencing health status 10/12/2017   Chronic pain disorder 10/12/2017   Neuropathy 08/11/2017   Burning sensation of feet 07/06/2017   Lower extremity numbness and tingling (Left) 07/06/2017   Left leg weakness 07/06/2017   Pain in both lower extremities 06/24/2017   Polyneuropathy associated with underlying disease ([place]) 05/29/2017   DM type 2, uncontrolled, with neuropathy 05/05/2017   Back pain with left-sided sciatica 10/16/2015   Meralgia paraesthetica, left 10/16/2015   Neuropathic pain 06/29/2015   GERD without esophagitis 01/16/2014   Diabetes mellitus ([place]) 01/21/2003    Arnika Larzelere, PT DPT 06/27/2021, 12:28 PM  [place] MAIN [facility] SERVICES [place], [name], [address] Phone: [phone]   Fax:  [phone]  Name: JORDYNE POEHLMAN MRN: [id number] Date of Birth: [birth year]

## 2021-07-02 ENCOUNTER — Ambulatory Visit: Payer: Medicare Other | Admitting: Physical Therapy

## 2021-07-02 ENCOUNTER — Encounter: Payer: Medicare Other | Admitting: Physical Therapy

## 2021-07-02 ENCOUNTER — Encounter: Payer: Self-pay | Admitting: Physical Therapy

## 2021-07-02 DIAGNOSIS — M5459 Other low back pain: Secondary | ICD-10-CM

## 2021-07-02 DIAGNOSIS — R2689 Other abnormalities of gait and mobility: Secondary | ICD-10-CM

## 2021-07-02 DIAGNOSIS — M6281 Muscle weakness (generalized): Secondary | ICD-10-CM

## 2021-07-02 NOTE — Therapy (Signed)
[place] MAIN [facility] SERVICES [address] [place], [name], [address] Phone: [phone]   Fax:  [phone]  Physical Therapy Treatment  Patient Details  Name: Charlene [name] MRN: [id number] Date of Birth: 14-Jul-1960 Referring Provider (PT): Milinda Pointer, MD   Encounter Date: 07/02/2021   PT End of Session - 07/02/21 0906     Visit Number 13    Number of Visits 16    Date for PT Re-Evaluation 07/05/21    Progress Note Due on Visit 10    PT Start Time 0901    PT Stop Time 0929    PT Time Calculation (min) 28 min    Equipment Utilized During Treatment Gait belt    Activity Tolerance Patient tolerated treatment well;No increased pain    Behavior During Therapy WFL for tasks assessed/performed             Past Medical History:  Diagnosis Date   Arthritis    knees   Degenerative disc disease, lumbar    Diabetes mellitus without complication (HCC)    GERD (gastroesophageal reflux disease)    Hypertension    Neuropathy    left lower leg   Polyneuropathy    Polyneuropathy     Past Surgical History:  Procedure Laterality Date   ABDOMINAL HYSTERECTOMY     arthroscopic rotator cuff     CATARACT EXTRACTION W/PHACO Right 11/25/2017   Procedure: CATARACT EXTRACTION PHACO AND INTRAOCULAR LENS PLACEMENT ([place]) RIGHT DIABETIC;  Surgeon: Leandrew Koyanagi, MD;  Location: [place] [facility];  Service: Ophthalmology;  Laterality: Right;  Diabetic - insulin and oral meds   COLONOSCOPY     endosopic sinus      There were no vitals filed for this visit.   Subjective Assessment - 07/02/21 0904     Subjective Pt reports her back has been doing well. She reports a little pain a few days ago but nothing significant. Pt reports no pain at this time.    Pertinent History Pt reports she has had back pain chronically and she has to get injections every 4-5 months for management of low back pain. Pt lives in [place] which is about  a 45 min drive from clinic. Pt ambulates with cane to clinic and has been ambulating with cane for 3 years. Pt reports no falls in the last 6 months but has had some incidences of loss of balance where she feels she is leaning posteriorlly. Pt reports her lower back pain is worse when she has to stand for prolonged periods but she reports she is able to walk for prolonged periods without back pain but has some pain in the legs with prolonged walking. Pt reports pain in the area of her low back that wraps following her weist line and no complaints of radiating pain down her legs. Pt back pain began around 5 years ago and was a gradual progression in pain and discomfort. Pt reports standing on hard floors for many years of working may have contributing. 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 Per last MD visit pertintant diagnoses includeIndications: Low back pain severe enough to impact quality of life or function, Lumbar facet syndrome, Lumbar facet hypertrophy, Spondylosis without myelopathy or radiculopathy, lumbosacral region, 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), Lumbar facet arthropathy, DDD (degenerative disc disease), lumbar, Chronic low back pain (  Bilateral) w/o sciatica    Limitations Standing    How long can you sit comfortably? Unlimited    How long can you stand comfortably? Pain following 20 minutes of standing and after 30 minutes she has to sit    How long can you walk comfortably? Not sure, able to walk to clinic, c/o leg pain and fatigue with prolonged walking    Currently in Pain? No/denies    Pain Onset More than a month ago               TREATMENT: Warm up on Nustep BUE/ BLE level 2 x4 min with cues for steps per minute > 50 to challenge cardiovascular conditioning/strengthening;   Forward step  ups on 6 inch step with BUE rail assist x10 reps each LE, no pain in the back   Instructed patient in standing LE strengthening: Standing with red tband around BLE: -side stepping x 3 laps in // bars to target functional hip ABD  -hip extension/ abduction combined movement 2x10 reps ea LE  Patient required min-moderate verbal/tactile cues for correct exercise technique including to increase core stabilization for less back discomfort;    Standing HS curl 3# Aw 2 x 10 ea LE   Walking lunge in // bars 3 laps with 5 lunges per lap, alternating LE each lunge   Standing  BLE heel/toe raises x10 reps with cues for proper positioning; Patient tolerated well with good ankle ROM;   Pt session was cut a little short today due to pt arriving a few minutes late to scheduled appointment time Pt educated throughout session about proper posture and technique with exercises. Improved exercise technique, movement at target joints, use of target muscles after min to mod verbal, visual, tactile cues. Rationale for Evaluation and Treatment Rehabilitation                                      PT Education - 07/02/21 0906     Education Details LE exercise technique    Person(s) Educated Patient    Methods Explanation;Verbal cues    Comprehension Verbalized understanding;Returned demonstration;Verbal cues required;Tactile cues required              PT Short Term Goals - 06/18/21 0903       PT SHORT TERM GOAL #1   Title Patient will be independent in home exercise program to improve strength/mobility for better functional independence with ADLs.     Baseline No low back HEP, 5/20: adherent, does them 2x a day    Time 4    Period Weeks    Status Achieved    Target Date 06/07/21               PT Long Term Goals - 06/18/21 0904       PT LONG TERM GOAL #1   Title Patient will reduce modified Oswestry score to 20% as to demonstrate minimal disability with ADLs  including improved sleeping tolerance, walking/sitting tolerance etc for better mobility with ADLs.    Baseline 05/10/21: 20/50 / 40%, 5/30: 20%    Time 8    Period Weeks    Status Achieved    Target Date 07/05/21      PT LONG TERM GOAL #2   Title Patient will increase FOTO score to equal to or greater than  55%   to demonstrate statistically significant improvement in mobility and  quality of life.    Baseline 5/30: 53%    Time 8    Period Weeks    Status Partially Met    Target Date 07/05/21      PT LONG TERM GOAL #3   Title Patient will improve bilateral hip strength to 4+ out of 5 or greater for hip abduction, flexion, and extension in order to improve hip muscle strength and stability for prolonged standing    Baseline See initial evaluation, 5/30: see note    Time 8    Period Weeks    Status Partially Met    Target Date 07/05/21      PT LONG TERM GOAL #4   Title Patient report ability to stand for 30 minutes or longer without reports of increased low back pain in order to improve her ability to perform cooking and other standing ADLs without pain and discomfort    Baseline Able to stand 20 minutes prior to onset of low back pain, 5/30: able to stand 2 hours    Time 8    Period Weeks    Status Achieved    Target Date 07/05/21                   Plan - 07/02/21 0907     Clinical Impression Statement Pt motivated and participates well within session. Pt progressed with several LE strengthening exercises with good response overall. Pt has no pain with any activities performed this date. Pt continuing to progress well with therapeutic interventions and will continue to benefit from skilled PT to improve her strenght, mobility and strength.    Personal Factors and Comorbidities Comorbidity 3+;Time since onset of injury/illness/exacerbation    Comorbidities DM type II, severe diabetic neuropathy, HTN, arthritis, DDD, GERD, brain tumor    Examination-Activity Limitations  Bend;Caring for Others;Carry;Reach Overhead;Squat;Stairs;Stand;Transfers    Examination-Participation Restrictions Cleaning;Community Activity;Meal Prep;Yard Work    [employer] Evolving/Moderate complexity    Rehab Potential Fair    Clinical Impairments Affecting Rehab Potential medical comorbidities, chronicity    PT Frequency 2x / week    PT Duration 8 weeks    PT Treatment/Interventions ADLs/Self Care Home Management;Aquatic Therapy;Biofeedback;Cryotherapy;Electrical Stimulation;Iontophoresis 4mg /ml Dexamethasone;Moist Heat;Traction;Ultrasound;DME Instruction;Gait training;Stair training;Functional mobility training;Therapeutic activities;Therapeutic exercise;Balance training;Neuromuscular re-education;Patient/family education;Cognitive remediation;Orthotic Fit/Training;Manual techniques;Passive range of motion;Compression bandaging;Energy conservation;Splinting;Taping;Visual/perceptual remediation/compensation;Dry needling;Spinal Manipulations    PT Next Visit Plan Initiate physical therapy interventions as well as provide home exercise program including low back stretches which may include piriformis stretch, knee-to-chest stretch, and hip strengthening exercises. continue plan    PT Home Exercise Plan no updates    Consulted and Agree with Plan of Care Patient             Patient will benefit from skilled therapeutic intervention in order to improve the following deficits and impairments:  Abnormal gait, Decreased activity tolerance, Decreased endurance, Decreased mobility, Decreased strength, Decreased range of motion, Improper body mechanics, Postural dysfunction, Pain, Impaired flexibility, Impaired perceived functional ability, Decreased balance  Visit Diagnosis: Muscle weakness (generalized)  Other low back pain  Other abnormalities of gait and mobility     Problem List Patient Active Problem List   Diagnosis Date Noted   Chronic sacroiliac joint  pain (Right) 03/11/2021   Diabetic retinopathy ([place]) 12/17/2020   Hypertensive retinopathy of both eyes 12/17/2020   NPDR (nonproliferative diabetic retinopathy) ([place]) 12/17/2020   Pseudophakia of right eye 12/17/2020   Disease of thyroid gland 12/17/2020   Abnormal drug screen (09/03/2020) 12/06/2020  Pain medication agreement broken (12/06/2020) 12/06/2020   History of cocaine use (09/03/2020) 48/27/0786   History of illicit drug use (7/54/4920) 12/06/2020   Substance use disorder 12/06/2020   Spondylosis without myelopathy or radiculopathy, lumbosacral region 10/09/2020   Chronic low back pain (Bilateral) w/o sciatica 10/09/2020   Chronic ankle and foot pain (1ry area of Pain) (Left) 09/03/2020   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) 09/03/2020   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) 09/03/2020   Chronic use of opiate for therapeutic purpose 07/08/2020   Pain medication agreement signed 11/08/2019   Meningioma ([place]) 11/01/2019   Hx of resection of meningioma 09/14/2019   Osteoporosis 08/05/2019   Neck pain 03/11/2019   Knee pain 11/18/2018   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) 10/06/2018   Osteoarthritis involving multiple joints 08/04/2018   Type 2 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, with long-term current use of insulin (HCC) 03/22/2018   Abnormal EMG (07/06/2017) 11/23/2017   Long-term insulin use ([place]) 11/10/2017   Uncontrolled type 2 diabetes mellitus with hyperglycemia ([place]) 11/10/2017   Vitamin D deficiency 10/21/2017   DM type 2 with diabetic peripheral neuropathy ([place]) 10/21/2017   DDD (degenerative disc disease), lumbar 10/21/2017   Lumbar facet arthropathy 10/21/2017   Lumbar facet syndrome 10/21/2017   Lumbar facet hypertrophy 10/21/2017   Lumbar foraminal stenosis (L5-S1) (Right) 10/21/2017   Osteoarthritis of knee (Bilateral) 10/21/2017   Chronic  musculoskeletal pain 10/21/2017   Arthropathy of lumbar facet joint 10/21/2017   Essential hypertension 10/12/2017   Chronic ankle pain (Left) 10/12/2017   Chronic foot pain (Left) 10/12/2017   Chronic lower extremity pain (2ry area of Pain) (Bilateral) (L>R) 10/12/2017   Chronic knee pain (4th area of Pain) (Bilateral) (L>R) 10/12/2017   Chronic low back pain (3ry area of Pain) (Bilateral) (L>R) w/ sciatica (Left) 10/12/2017   Chronic pain syndrome 10/12/2017   Opiate use 10/12/2017   Pharmacologic therapy 10/12/2017   Disorder of skeletal system 10/12/2017   Problems influencing health status 10/12/2017   Chronic pain disorder 10/12/2017   Neuropathy 08/11/2017   Burning sensation of feet 07/06/2017   Lower extremity numbness and tingling (Left) 07/06/2017   Left leg weakness 07/06/2017   Pain in both lower extremities 06/24/2017   Polyneuropathy associated with underlying disease ([place]) 05/29/2017   DM type 2, uncontrolled, with neuropathy 05/05/2017   Back pain with left-sided sciatica 10/16/2015   Meralgia paraesthetica, left 10/16/2015   Neuropathic pain 06/29/2015   GERD without esophagitis 01/16/2014   Diabetes mellitus ([place]) 01/21/2003    Particia Lather, PT 07/02/2021, 12:33 PM  [place] MAIN [facility] SERVICES [place], [name], [address] Phone: [phone]   Fax:  [phone]  Name: Charlene [name] MRN: [id number] Date of Birth: [birth year]

## 2021-07-04 ENCOUNTER — Ambulatory Visit: Payer: Medicare Other

## 2021-07-04 DIAGNOSIS — M6281 Muscle weakness (generalized): Secondary | ICD-10-CM | POA: Diagnosis not present

## 2021-07-04 NOTE — Therapy (Signed)
[facility] MAIN [facility] SERVICES [address] [place], [place], [address] Phone: [phone]   Fax:  [phone]  Physical Therapy Treatment  Patient Details  Name: Charlene [name] MRN: [id number] Date of Birth: Oct 08, 1960 Referring Provider (PT): Delano Metz, MD   Encounter Date: 07/04/2021   PT End of Session - 07/04/21 0858     Visit Number 14    Number of Visits 16    Date for PT Re-Evaluation 07/05/21    Progress Note Due on Visit 10    PT Start Time 0846    PT Stop Time 0929    PT Time Calculation (min) 43 min    Equipment Utilized During Treatment Gait belt    Activity Tolerance Patient tolerated treatment well;No increased pain    Behavior During Therapy WFL for tasks assessed/performed             Past Medical History:  Diagnosis Date   Arthritis    knees   Degenerative disc disease, lumbar    Diabetes mellitus without complication (HCC)    GERD (gastroesophageal reflux disease)    Hypertension    Neuropathy    left lower leg   Polyneuropathy    Polyneuropathy     Past Surgical History:  Procedure Laterality Date   ABDOMINAL HYSTERECTOMY     arthroscopic rotator cuff     CATARACT EXTRACTION W/PHACO Right 11/25/2017   Procedure: CATARACT EXTRACTION PHACO AND INTRAOCULAR LENS PLACEMENT (IOC) RIGHT DIABETIC;  Surgeon: Lockie Mola, MD;  Location: [facility];  Service: Ophthalmology;  Laterality: Right;  Diabetic - insulin and oral meds   COLONOSCOPY     endosopic sinus      There were no vitals filed for this visit.   Subjective Assessment - 07/04/21 0842     Subjective Pt reports back currently feels good, but was hurting yesterday. Pt also had a cramp in her side yesterday for 15 minutes.    Pertinent History Pt reports she has had back pain chronically and she has to get injections every 4-5 months for management of low back pain. Pt lives in [place] which is about a 45 min drive  from clinic. Pt ambulates with cane to clinic and has been ambulating with cane for 3 years. Pt reports no falls in the last 6 months but has had some incidences of loss of balance where she feels she is leaning posteriorlly. Pt reports her lower back pain is worse when she has to stand for prolonged periods but she reports she is able to walk for prolonged periods without back pain but has some pain in the legs with prolonged walking. Pt reports pain in the area of her low back that wraps following her weist line and no complaints of radiating pain down her legs. Pt back pain began around 5 years ago and was a gradual progression in pain and discomfort. Pt reports standing on hard floors for many years of working may have contributing. 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 Per last MD visit pertintant diagnoses includeIndications: Low back pain severe enough to impact quality of life or function, Lumbar facet syndrome, Lumbar facet hypertrophy, Spondylosis without myelopathy or radiculopathy, lumbosacral region, 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), Lumbar facet arthropathy, DDD (degenerative disc disease), lumbar, Chronic low back pain (Bilateral) w/o sciatica  Limitations Standing    How long can you sit comfortably? Unlimited    How long can you stand comfortably? Pain following 20 minutes of standing and after 30 minutes she has to sit    How long can you walk comfortably? Not sure, able to walk to clinic, c/o leg pain and fatigue with prolonged walking    Currently in Pain? No/denies    Pain Onset More than a month ago              TREATMENT:  Warm up on Nustep BUE/ BLE level 2 x4 min with cues for steps per minute > 60 to challenge cardiovascular conditioning/strengthening;    RTB monster walks at support  surface B 10x  3# weights donned each LE: Standing hip ext. 3x10 each LE Standing HS curl 3# Aw 1 x 15 ea LE, 1x15 @ 5# 12 each LE   At cable machine:   Standing anti-rotation isometric 2.5# 2x30 sec each side. Rates medium  Seated: Physioball rollouts FWD/BCKWD and side-to-side x multiple reps of each with 3 sec hold at end range. Pt reports intervention feels good  Forward step ups on 6 inch step with BUE rail assist x12 reps each LE, no pain in the back   Heel raises 20x bilat. Pt rates easy  Seated hamstring stretch 2x30 sec each LE  Standing hip flexor stretch 2x30 sec each LE  Seated figure four stretch 60 sec each LE  Pt educated throughout session about proper posture and technique with exercises. Improved exercise technique, movement at target joints, use of target muscles after min to mod verbal, visual, tactile cues. Rationale for Evaluation and Treatment Rehabilitation     PT Education - 07/04/21 0857     Education Details LE strengthening, exercise technique    Person(s) Educated Patient    Methods Explanation;Verbal cues;Demonstration    Comprehension Verbalized understanding;Returned demonstration;Verbal cues required;Need further instruction              PT Short Term Goals - 06/18/21 0903       PT SHORT TERM GOAL #1   Title Patient will be independent in home exercise program to improve strength/mobility for better functional independence with ADLs.     Baseline No low back HEP, 5/20: adherent, does them 2x a day    Time 4    Period Weeks    Status Achieved    Target Date 06/07/21               PT Long Term Goals - 06/18/21 0904       PT LONG TERM GOAL #1   Title Patient will reduce modified Oswestry score to 20% as to demonstrate minimal disability with ADLs including improved sleeping tolerance, walking/sitting tolerance etc for better mobility with ADLs.    Baseline 05/10/21: 20/50 / 40%, 5/30: 20%    Time 8    Period Weeks     Status Achieved    Target Date 07/05/21      PT LONG TERM GOAL #2   Title Patient will increase FOTO score to equal to or greater than  55%   to demonstrate statistically significant improvement in mobility and quality of life.    Baseline 5/30: 53%    Time 8    Period Weeks    Status Partially Met    Target Date 07/05/21      PT LONG TERM GOAL #3   Title Patient will improve bilateral hip strength to 4+ out  of 5 or greater for hip abduction, flexion, and extension in order to improve hip muscle strength and stability for prolonged standing    Baseline See initial evaluation, 5/30: see note    Time 8    Period Weeks    Status Partially Met    Target Date 07/05/21      PT LONG TERM GOAL #4   Title Patient report ability to stand for 30 minutes or longer without reports of increased low back pain in order to improve her ability to perform cooking and other standing ADLs without pain and discomfort    Baseline Able to stand 20 minutes prior to onset of low back pain, 5/30: able to stand 2 hours    Time 8    Period Weeks    Status Achieved    Target Date 07/05/21                   Plan - 07/04/21 0939     Clinical Impression Statement Pt highly motivated to participate in session. She was able to advance level of resistance used with strengthening therex without pain. This indicates improved LE strength. While pt shows progress, she reported challenge with heel raises and quick onset of fatigue. The pt will benefit from further skilled PT to improve strength, pain and mobility.    Personal Factors and Comorbidities Comorbidity 3+;Time since onset of injury/illness/exacerbation    Comorbidities DM type II, severe diabetic neuropathy, HTN, arthritis, DDD, GERD, brain tumor    Examination-Activity Limitations Bend;Caring for Others;Carry;Reach Overhead;Squat;Stairs;Stand;Transfers    Examination-Participation Restrictions Cleaning;Community Activity;Meal Prep;Yard Work     [employer] Evolving/Moderate complexity    Rehab Potential Fair    Clinical Impairments Affecting Rehab Potential medical comorbidities, chronicity    PT Frequency 2x / week    PT Duration 8 weeks    PT Treatment/Interventions ADLs/Self Care Home Management;Aquatic Therapy;Biofeedback;Cryotherapy;Electrical Stimulation;Iontophoresis 4mg /ml Dexamethasone;Moist Heat;Traction;Ultrasound;DME Instruction;Gait training;Stair training;Functional mobility training;Therapeutic activities;Therapeutic exercise;Balance training;Neuromuscular re-education;Patient/family education;Cognitive remediation;Orthotic Fit/Training;Manual techniques;Passive range of motion;Compression bandaging;Energy conservation;Splinting;Taping;Visual/perceptual remediation/compensation;Dry needling;Spinal Manipulations    PT Next Visit Plan Initiate physical therapy interventions as well as provide home exercise program including low back stretches which may include piriformis stretch, knee-to-chest stretch, and hip strengthening exercises. continue plan    PT Home Exercise Plan no updates    Consulted and Agree with Plan of Care Patient             Patient will benefit from skilled therapeutic intervention in order to improve the following deficits and impairments:  Abnormal gait, Decreased activity tolerance, Decreased endurance, Decreased mobility, Decreased strength, Decreased range of motion, Improper body mechanics, Postural dysfunction, Pain, Impaired flexibility, Impaired perceived functional ability, Decreased balance  Visit Diagnosis: Muscle weakness (generalized)     Problem List Patient Active Problem List   Diagnosis Date Noted   Chronic sacroiliac joint pain (Right) 03/11/2021   Diabetic retinopathy ([place]) 12/17/2020   Hypertensive retinopathy of both eyes 12/17/2020   NPDR (nonproliferative diabetic retinopathy) ([place]) 12/17/2020   Pseudophakia of right eye 12/17/2020   Disease of  thyroid gland 12/17/2020   Abnormal drug screen (09/03/2020) 12/06/2020   Pain medication agreement broken (12/06/2020) 12/06/2020   History of cocaine use (09/03/2020) 70/96/2836   History of illicit drug use (07/19/4763) 12/06/2020   Substance use disorder 12/06/2020   Spondylosis without myelopathy or radiculopathy, lumbosacral region 10/09/2020   Chronic low back pain (Bilateral) w/o sciatica 10/09/2020   Chronic ankle and foot pain (1ry area of Pain) (Left)  09/03/2020   Diabetic sensory polyneuropathy ([place]) (by NCT) 09/03/2020   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) 09/03/2020   Chronic use of opiate for therapeutic purpose 07/08/2020   Pain medication agreement signed 11/08/2019   Meningioma ([place]) 11/01/2019   Hx of resection of meningioma 09/14/2019   Osteoporosis 08/05/2019   Neck pain 03/11/2019   Knee pain 11/18/2018   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) 10/06/2018   Osteoarthritis involving multiple joints 08/04/2018   Type 2 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, with long-term current use of insulin ([place]) 03/22/2018   Abnormal EMG (07/06/2017) 11/23/2017   Long-term insulin use ([place]) 11/10/2017   Uncontrolled type 2 diabetes mellitus with hyperglycemia ([place]) 11/10/2017   Vitamin D deficiency 10/21/2017   DM type 2 with diabetic peripheral neuropathy ([place]) 10/21/2017   DDD (degenerative disc disease), lumbar 10/21/2017   Lumbar facet arthropathy 10/21/2017   Lumbar facet syndrome 10/21/2017   Lumbar facet hypertrophy 10/21/2017   Lumbar foraminal stenosis (L5-S1) (Right) 10/21/2017   Osteoarthritis of knee (Bilateral) 10/21/2017   Chronic musculoskeletal pain 10/21/2017   Arthropathy of lumbar facet joint 10/21/2017   Essential hypertension 10/12/2017   Chronic ankle pain (Left) 10/12/2017   Chronic foot pain (Left) 10/12/2017   Chronic lower extremity pain (2ry area of  Pain) (Bilateral) (L>R) 10/12/2017   Chronic knee pain (4th area of Pain) (Bilateral) (L>R) 10/12/2017   Chronic low back pain (3ry area of Pain) (Bilateral) (L>R) w/ sciatica (Left) 10/12/2017   Chronic pain syndrome 10/12/2017   Opiate use 10/12/2017   Pharmacologic therapy 10/12/2017   Disorder of skeletal system 10/12/2017   Problems influencing health status 10/12/2017   Chronic pain disorder 10/12/2017   Neuropathy 08/11/2017   Burning sensation of feet 07/06/2017   Lower extremity numbness and tingling (Left) 07/06/2017   Left leg weakness 07/06/2017   Pain in both lower extremities 06/24/2017   Polyneuropathy associated with underlying disease ([place]) 05/29/2017   DM type 2, uncontrolled, with neuropathy 05/05/2017   Back pain with left-sided sciatica 10/16/2015   Meralgia paraesthetica, left 10/16/2015   Neuropathic pain 06/29/2015   GERD without esophagitis 01/16/2014   Diabetes mellitus ([place]) 01/21/2003    Zollie Pee, PT 07/04/2021, 9:45 AM  [place] MAIN [facility] SERVICES [place], [name], [address] Phone: [phone]   Fax:  [phone]  Name: Charlene [name] MRN: [id number] Date of Birth: 01-19-1961

## 2021-07-05 ENCOUNTER — Encounter: Payer: Medicare Other | Admitting: Physical Therapy

## 2021-07-08 ENCOUNTER — Ambulatory Visit: Payer: Medicare Other

## 2021-07-08 DIAGNOSIS — M6281 Muscle weakness (generalized): Secondary | ICD-10-CM | POA: Diagnosis not present

## 2021-07-08 DIAGNOSIS — M5459 Other low back pain: Secondary | ICD-10-CM

## 2021-07-08 NOTE — Therapy (Signed)
[place] [facility] [address] [place], [name], [address] Phone: [phone]   Fax:  [phone]  Physical Therapy Treatment/RECERT  Patient Details  Name: Charlene [name] MRN: [id number] Date of Birth: 05-02-1960 Referring Provider (PT): Milinda Pointer, MD   Encounter Date: 07/08/2021   PT End of Session - 07/08/21 0829     Visit Number 15    Number of Visits 16    Date for PT Re-Evaluation 09/02/21    Progress Note Due on Visit 10    PT Start Time 0808    PT Stop Time 0846    PT Time Calculation (min) 38 min    Equipment Utilized During Treatment Gait belt    Activity Tolerance Patient tolerated treatment well;No increased pain    Behavior During Therapy WFL for tasks assessed/performed             Past Medical History:  Diagnosis Date   Arthritis    knees   Degenerative disc disease, lumbar    Diabetes mellitus without complication (HCC)    GERD (gastroesophageal reflux disease)    Hypertension    Neuropathy    left lower leg   Polyneuropathy    Polyneuropathy     Past Surgical History:  Procedure Laterality Date   ABDOMINAL HYSTERECTOMY     arthroscopic rotator cuff     CATARACT EXTRACTION W/PHACO Right 11/25/2017   Procedure: CATARACT EXTRACTION PHACO AND INTRAOCULAR LENS PLACEMENT ([place]) RIGHT DIABETIC;  Surgeon: Leandrew Koyanagi, MD;  Location: [place];  Service: Ophthalmology;  Laterality: Right;  Diabetic - insulin and oral meds   COLONOSCOPY     endosopic sinus      There were no vitals filed for this visit.   Subjective Assessment - 07/08/21 0811     Subjective Pt reports pain level is currently 0/10. Pt reports some back pain over the weekend. States, "it wasn't bad."    Pertinent History Pt reports she has had back pain chronically and she has to get injections every 4-5 months for management of low back pain. Pt lives in [place] which is about a 45 min drive from  clinic. Pt ambulates with cane to clinic and has been ambulating with cane for 3 years. Pt reports no falls in the last 6 months but has had some incidences of loss of balance where she feels she is leaning posteriorlly. Pt reports her lower back pain is worse when she has to stand for prolonged periods but she reports she is able to walk for prolonged periods without back pain but has some pain in the legs with prolonged walking. Pt reports pain in the area of her low back that wraps following her weist line and no complaints of radiating pain down her legs. Pt back pain began around 5 years ago and was a gradual progression in pain and discomfort. Pt reports standing on hard floors for many years of working may have contributing. 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 Per last MD visit pertintant diagnoses includeIndications: Low back pain severe enough to impact quality of life or function, Lumbar facet syndrome, Lumbar facet hypertrophy, Spondylosis without myelopathy or radiculopathy, lumbosacral region, 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), Lumbar facet arthropathy, DDD (degenerative disc disease), lumbar, Chronic low back pain (Bilateral) w/o sciatica    Limitations Standing  How long can you sit comfortably? Unlimited    How long can you stand comfortably? Pain following 20 minutes of standing and after 30 minutes she has to sit    How long can you walk comfortably? Not sure, able to walk to clinic, c/o leg pain and fatigue with prolonged walking    Currently in Pain? No/denies    Pain Onset More than a month ago                TREATMENT:   Warm up on Nustep BUE/ BLE level 2-3 x4 min with cues for steps per minute > 60 to challenge cardiovascular conditioning/strengthening;   FOTO: 63   STS 10x. Pt  rates easy  Standing hip extension 12x each LE  Progressed to performing with 3 lb weights donned 12x 2 sets each LE  Standing hip abduction with 3# weights on each LE 2x15, 1x10 each LE  Single leg heel raises 15x each LE. Pt rates as medium.   At cable machine:    Standing anti-rotation isometric 2.5# 1x30, 7.5# 1x30 sec each side. Rates  7.5# medium   Seated:  Seated hamstring stretch 60 sec each LE  Seated figure four stretch 60 sec each LE   Pt educated throughout session about proper posture and technique with exercises. Improved exercise technique, movement at target joints, use of target muscles after min to mod verbal, visual, tactile cues. Rationale for Evaluation and Treatment Rehabilitation     PT Education - 07/08/21 0829     Education Details exercise technique, goals    Person(s) Educated Patient    Methods Explanation;Demonstration;Verbal cues    Comprehension Verbalized understanding;Returned demonstration;Verbal cues required;Need further instruction              PT Short Term Goals - 07/08/21 0813       PT SHORT TERM GOAL #1   Title Patient will be independent in home exercise program to improve strength/mobility for better functional independence with ADLs.     Baseline No low back HEP, 5/20: adherent, does them 2x a day    Time 4    Period Weeks    Status Achieved    Target Date 06/07/21               PT Long Term Goals - 07/08/21 0816       PT LONG TERM GOAL #1   Title Patient will reduce modified Oswestry score to 20% as to demonstrate minimal disability with ADLs including improved sleeping tolerance, walking/sitting tolerance etc for better mobility with ADLs.    Baseline 05/10/21: 20/50 / 40%, 5/30: 20%    Time 8    Period Weeks    Status Achieved    Target Date 07/05/21      PT LONG TERM GOAL #2   Title Patient will increase FOTO score to equal to or greater than  55%   to demonstrate statistically significant improvement in  mobility and quality of life.    Baseline 5/30: 53%; 6/19: 63%    Time 8    Period Weeks    Status Achieved    Target Date 09/02/21      PT LONG TERM GOAL #3   Title Patient will improve bilateral hip strength to 4+ out of 5 or greater for hip abduction, flexion, and extension in order to improve hip muscle strength and stability for prolonged standing    Baseline See initial evaluation, 5/30: see notel 6/19: B hip strength grossly  4+/5 with exception of ext. 4/5, PF/DF 4-/5 on L    Time 8    Period Weeks    Status Partially Met    Target Date 09/02/21      PT LONG TERM GOAL #4   Title Patient report ability to stand for 30 minutes or longer without reports of increased low back pain in order to improve her ability to perform cooking and other standing ADLs without pain and discomfort    Baseline Able to stand 20 minutes prior to onset of low back pain, 5/30: able to stand 2 hours    Time 8    Period Weeks    Status Achieved    Target Date 07/05/21                   Plan - 07/08/21 0204     Clinical Impression Statement Goals reassessed for progress note. Pt hip strength grossly 4+/5 with exception of hip extensors which are 4/5 B. Pt has achieved her FOTO goal, indicating improved functional mobility and QOL. PT and pt discuss plan for achieving remaining goals and continuing to improve back pain with standing. PT to recert pt for 8 more weeks to accomplish these goals. Pt agreeable to plan. Patient's condition has the potential to improve in response to therapy. Maximum improvement is yet to be obtained. The anticipated improvement is attainable and reasonable in a generally predictable time. The pt will benefit from further skilled PT to improve pain, strength, and mobility.    Personal Factors and Comorbidities Comorbidity 3+;Time since onset of injury/illness/exacerbation    Comorbidities DM type II, severe diabetic neuropathy, HTN, arthritis, DDD, GERD, brain tumor     Examination-Activity Limitations Bend;Caring for Others;Carry;Reach Overhead;Squat;Stairs;Stand;Transfers    Examination-Participation Restrictions Cleaning;Community Activity;Meal Prep;Yard Work    Conservation officer, historic buildings Evolving/Moderate complexity    Rehab Potential Fair    Clinical Impairments Affecting Rehab Potential medical comorbidities, chronicity    PT Frequency 2x / week    PT Duration 8 weeks    PT Treatment/Interventions ADLs/Self Care Home Management;Aquatic Therapy;Biofeedback;Cryotherapy;Electrical Stimulation;Iontophoresis 4mg /ml Dexamethasone;Moist Heat;Traction;Ultrasound;DME Instruction;Gait training;Stair training;Functional mobility training;Therapeutic activities;Therapeutic exercise;Balance training;Neuromuscular re-education;Patient/family education;Cognitive remediation;Orthotic Fit/Training;Manual techniques;Passive range of motion;Compression bandaging;Energy conservation;Splinting;Taping;Visual/perceptual remediation/compensation;Dry needling;Spinal Manipulations    PT Next Visit Plan Initiate physical therapy interventions as well as provide home exercise program including low back stretches which may include piriformis stretch, knee-to-chest stretch, and hip strengthening exercises. continue plan    PT Home Exercise Plan no updates    Consulted and Agree with Plan of Care Patient             Patient will benefit from skilled therapeutic intervention in order to improve the following deficits and impairments:  Abnormal gait, Decreased activity tolerance, Decreased endurance, Decreased mobility, Decreased strength, Decreased range of motion, Improper body mechanics, Postural dysfunction, Pain, Impaired flexibility, Impaired perceived functional ability, Decreased balance  Visit Diagnosis: Other low back pain  Muscle weakness (generalized)     Problem List Patient Active Problem List   Diagnosis Date Noted   Chronic sacroiliac joint pain (Right)  03/11/2021   Diabetic retinopathy (HCC) 12/17/2020   Hypertensive retinopathy of both eyes 12/17/2020   NPDR (nonproliferative diabetic retinopathy) (HCC) 12/17/2020   Pseudophakia of right eye 12/17/2020   Disease of thyroid gland 12/17/2020   Abnormal drug screen (09/03/2020) 12/06/2020   Pain medication agreement broken (12/06/2020) 12/06/2020   History of cocaine use (09/03/2020) 12/06/2020   History of illicit drug use (09/03/2020) 12/06/2020  Substance use disorder 12/06/2020   Spondylosis without myelopathy or radiculopathy, lumbosacral region 10/09/2020   Chronic low back pain (Bilateral) w/o sciatica 10/09/2020   Chronic ankle and foot pain (1ry area of Pain) (Left) 09/03/2020   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) 09/03/2020   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) 09/03/2020   Chronic use of opiate for therapeutic purpose 07/08/2020   Pain medication agreement signed 11/08/2019   Meningioma ([place]) 11/01/2019   Hx of resection of meningioma 09/14/2019   Osteoporosis 08/05/2019   Neck pain 03/11/2019   Knee pain 11/18/2018   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) 10/06/2018   Osteoarthritis involving multiple joints 08/04/2018   Type 2 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, with long-term current use of insulin (HCC) 03/22/2018   Abnormal EMG (07/06/2017) 11/23/2017   Long-term insulin use ([place]) 11/10/2017   Uncontrolled type 2 diabetes mellitus with hyperglycemia ([place]) 11/10/2017   Vitamin D deficiency 10/21/2017   DM type 2 with diabetic peripheral neuropathy ([place]) 10/21/2017   DDD (degenerative disc disease), lumbar 10/21/2017   Lumbar facet arthropathy 10/21/2017   Lumbar facet syndrome 10/21/2017   Lumbar facet hypertrophy 10/21/2017   Lumbar foraminal stenosis (L5-S1) (Right) 10/21/2017   Osteoarthritis of knee (Bilateral) 10/21/2017   Chronic musculoskeletal  pain 10/21/2017   Arthropathy of lumbar facet joint 10/21/2017   Essential hypertension 10/12/2017   Chronic ankle pain (Left) 10/12/2017   Chronic foot pain (Left) 10/12/2017   Chronic lower extremity pain (2ry area of Pain) (Bilateral) (L>R) 10/12/2017   Chronic knee pain (4th area of Pain) (Bilateral) (L>R) 10/12/2017   Chronic low back pain (3ry area of Pain) (Bilateral) (L>R) w/ sciatica (Left) 10/12/2017   Chronic pain syndrome 10/12/2017   Opiate use 10/12/2017   Pharmacologic therapy 10/12/2017   Disorder of skeletal system 10/12/2017   Problems influencing health status 10/12/2017   Chronic pain disorder 10/12/2017   Neuropathy 08/11/2017   Burning sensation of feet 07/06/2017   Lower extremity numbness and tingling (Left) 07/06/2017   Left leg weakness 07/06/2017   Pain in both lower extremities 06/24/2017   Polyneuropathy associated with underlying disease ([place]) 05/29/2017   DM type 2, uncontrolled, with neuropathy 05/05/2017   Back pain with left-sided sciatica 10/16/2015   Meralgia paraesthetica, left 10/16/2015   Neuropathic pain 06/29/2015   GERD without esophagitis 01/16/2014   Diabetes mellitus ([place]) 01/21/2003    Zollie Pee, PT 07/08/2021, 9:52 AM  [place] [facility] SERVICES [place], [name], [address] Phone: [phone]   Fax:  [phone]  Name: Charlene [name] MRN: [id number] Date of Birth: 02-Jun-1960

## 2021-07-09 ENCOUNTER — Encounter: Payer: Medicare Other | Admitting: Physical Therapy

## 2021-07-10 ENCOUNTER — Ambulatory Visit: Payer: Medicare Other

## 2021-07-10 DIAGNOSIS — R262 Difficulty in walking, not elsewhere classified: Secondary | ICD-10-CM

## 2021-07-10 DIAGNOSIS — M6281 Muscle weakness (generalized): Secondary | ICD-10-CM

## 2021-07-10 NOTE — Therapy (Signed)
[place] MAIN [facility] SERVICES [address] [place], [name], [address] Phone: [phone]   Fax:  [phone]  Patient Details  Name: [name] MRN: [id number] Date of Birth: [birth year] Referring Provider:  Milinda Pointer, MD  Encounter Date: 07/10/2021   OUTPATIENT PHYSICAL THERAPY TREATMENT NOTE   Patient Name: Charlene [name] MRN: [id number] DOB:16-Nov-1960, 61 y.o., female Today's Date: 07/10/2021  PCP: Charlene Harrier, MD REFERRING PROVIDER: Milinda Pointer, MD   PT End of Session - 07/10/21 1045     Visit Number 16    Number of Visits 31   updated 4/58 to reflect recert   Date for PT Re-Evaluation 09/02/21    Progress Note Due on Visit 10    PT Start Time 0942    PT Stop Time 1015    PT Time Calculation (min) 33 min    Equipment Utilized During Treatment Gait belt    Activity Tolerance Patient tolerated treatment well;No increased pain    Behavior During Therapy WFL for tasks assessed/performed             Past Medical History:  Diagnosis Date   Arthritis    knees   Degenerative disc disease, lumbar    Diabetes mellitus without complication (HCC)    GERD (gastroesophageal reflux disease)    Hypertension    Neuropathy    left lower leg   Polyneuropathy    Polyneuropathy    Past Surgical History:  Procedure Laterality Date   ABDOMINAL HYSTERECTOMY     arthroscopic rotator cuff     CATARACT EXTRACTION W/PHACO Right 11/25/2017   Procedure: CATARACT EXTRACTION PHACO AND INTRAOCULAR LENS PLACEMENT ([place]) RIGHT DIABETIC;  Surgeon: Charlene Koyanagi, MD;  Location: [place];  Service: Ophthalmology;  Laterality: Right;  Diabetic - insulin and oral meds   COLONOSCOPY     endosopic sinus     Patient Active Problem List   Diagnosis Date Noted   Chronic sacroiliac joint pain (Right) 03/11/2021   Diabetic retinopathy ([place]) 12/17/2020   Hypertensive retinopathy of both eyes 12/17/2020   NPDR  (nonproliferative diabetic retinopathy) ([place]) 12/17/2020   Pseudophakia of right eye 12/17/2020   Disease of thyroid gland 12/17/2020   Abnormal drug screen (09/03/2020) 12/06/2020   Pain medication agreement broken (12/06/2020) 12/06/2020   History of cocaine use (09/03/2020) 59/29/2446   History of illicit drug use (2/86/3817) 12/06/2020   Substance use disorder 12/06/2020   Spondylosis without myelopathy or radiculopathy, lumbosacral region 10/09/2020   Chronic low back pain (Bilateral) w/o sciatica 10/09/2020   Chronic ankle and foot pain (1ry area of Pain) (Left) 09/03/2020   Diabetic sensory polyneuropathy ([place]) (by NCT) 09/03/2020   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) 09/03/2020   Chronic use of opiate for therapeutic purpose 07/08/2020   Pain medication agreement signed 11/08/2019   Meningioma ([place]) 11/01/2019   Hx of resection of meningioma 09/14/2019   Osteoporosis 08/05/2019   Neck pain 03/11/2019   Knee pain 11/18/2018   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[place]) 10/06/2018   Osteoarthritis involving multiple joints 08/04/2018   Type 2 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, with long-term current use of insulin ([place]) 03/22/2018   Abnormal EMG (07/06/2017) 11/23/2017   Long-term insulin use ([place]) 11/10/2017   Uncontrolled type 2 diabetes mellitus with hyperglycemia ([place]) 11/10/2017   Vitamin D deficiency 10/21/2017   DM type 2 with diabetic peripheral neuropathy ([place]) 10/21/2017  DDD (degenerative disc disease), lumbar 10/21/2017   Lumbar facet arthropathy 10/21/2017   Lumbar facet syndrome 10/21/2017   Lumbar facet hypertrophy 10/21/2017   Lumbar foraminal stenosis (L5-S1) (Right) 10/21/2017   Osteoarthritis of knee (Bilateral) 10/21/2017   Chronic musculoskeletal pain 10/21/2017   Arthropathy of lumbar facet joint 10/21/2017   Essential hypertension 10/12/2017   Chronic  ankle pain (Left) 10/12/2017   Chronic foot pain (Left) 10/12/2017   Chronic lower extremity pain (2ry area of Pain) (Bilateral) (L>R) 10/12/2017   Chronic knee pain (4th area of Pain) (Bilateral) (L>R) 10/12/2017   Chronic low back pain (3ry area of Pain) (Bilateral) (L>R) w/ sciatica (Left) 10/12/2017   Chronic pain syndrome 10/12/2017   Opiate use 10/12/2017   Pharmacologic therapy 10/12/2017   Disorder of skeletal system 10/12/2017   Problems influencing health status 10/12/2017   Chronic pain disorder 10/12/2017   Neuropathy 08/11/2017   Burning sensation of feet 07/06/2017   Lower extremity numbness and tingling (Left) 07/06/2017   Left leg weakness 07/06/2017   Pain in both lower extremities 06/24/2017   Polyneuropathy associated with underlying disease ([place]) 05/29/2017   DM type 2, uncontrolled, with neuropathy 05/05/2017   Back pain with left-sided sciatica 10/16/2015   Meralgia paraesthetica, left 10/16/2015   Neuropathic pain 06/29/2015   GERD without esophagitis 01/16/2014   Diabetes mellitus ([place]) 01/21/2003    REFERRING DIAG: Chronic bilateral low back pain without sciatica, Lumbar facet joint syndrome  THERAPY DIAG:  Muscle weakness (generalized)  Difficulty in walking, not elsewhere classified  Rationale for Evaluation and Treatment Rehabilitation  PERTINENT HISTORY: Pt reports she has had back pain chronically and she has to get injections every 4-5 months for management of low back pain. Pt lives in [place] which is about a 45 min drive from clinic. Pt ambulates with cane to clinic and has been ambulating with cane for 3 years. Pt reports no falls in the last 6 months but has had some incidences of loss of balance where she feels she is leaning posteriorlly. Pt reports her lower back pain is worse when she has to stand for prolonged periods but she reports she is able to walk for prolonged periods without back pain but has some pain in the legs with  prolonged walking. Pt reports pain in the area of her low back that wraps following her weist line and no complaints of radiating pain down her legs. Pt back pain began around 5 years ago and was a gradual progression in pain and discomfort. Pt reports standing on hard floors for many years of working may have contributing. 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 Per last MD visit pertintant diagnoses includeIndications: Low back pain severe enough to impact quality of life or function, Lumbar facet syndrome, Lumbar facet hypertrophy, Spondylosis without myelopathy or radiculopathy, lumbosacral region, 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), Lumbar facet arthropathy, DDD (degenerative disc disease), lumbar, Chronic low back pain (Bilateral) w/o sciatica  PRECAUTIONS: fall  SUBJECTIVE: Pt reports no pain currently. She felt OK following last appointment. Pt reports no recent pain with walking/standing.   PAIN:  Are you having pain? No    TODAY'S TREATMENT:   07/10/21:    Warm up on Nustep BUE/ BLE level 3-4 x4 min to challenge cardiovascular conditioning/strengthening (unbilled)   STS 12x 2 sets. Pt reports LE fatigue.  3# weights donned each LE: Standing hip extension 12x2 sets, 1x15 each LE Standing hip abduction 2x15,    Single leg heel raises 2x15-18 each LE. Pt rates medium.   Leg press:  25# 2x15 40# 2x15  Seated: Seated hamstring stretch 2x30 sec each LE Seated figure four stretch 2x30  sec each LE      PATIENT EDUCATION: Education details: Pt educated throughout session about proper posture and technique with exercises. Improved exercise technique, movement at target joints, use of target muscles after min to mod verbal, visual, tactile cues.  Person educated: Patient Education method:  Explanation, Demonstration, and Verbal cues Education comprehension: verbalized understanding, returned demonstration, and needs further education   HOME EXERCISE PROGRAM: From 05/14/2021: Initiated HEP: Instructed patient in lumbar/hip stretches as well as core strengthening exercise to help with long term management of back pain   PT Short Term Goals -       PT SHORT TERM GOAL #1   Title Patient will be independent in home exercise program to improve strength/mobility for better functional independence with ADLs.     Baseline No low back HEP, 5/20: adherent, does them 2x a day    Time 4    Period Weeks    Status Achieved    Target Date 06/07/21              PT Long Term Goals -       PT LONG TERM GOAL #1   Title Patient will reduce modified Oswestry score to 20% as to demonstrate minimal disability with ADLs including improved sleeping tolerance, walking/sitting tolerance etc for better mobility with ADLs.    Baseline 05/10/21: 20/50 / 40%, 5/30: 20%    Time 8    Period Weeks    Status Achieved    Target Date 07/05/21      PT LONG TERM GOAL #2   Title Patient will increase FOTO score to equal to or greater than  55%   to demonstrate statistically significant improvement in mobility and quality of life.    Baseline 5/30: 53%; 6/19: 63%    Time 8    Period Weeks    Status Achieved    Target Date 09/02/21      PT LONG TERM GOAL #3   Title Patient will improve bilateral hip strength to 4+ out of 5 or greater for hip abduction, flexion, and extension in order to improve hip muscle strength and stability for prolonged standing    Baseline See initial evaluation, 5/30: see notel 6/19: B hip strength grossly 4+/5 with exception of ext. 4/5, PF/DF 4-/5 on L    Time 8    Period Weeks    Status Partially Met    Target Date 09/02/21      PT LONG TERM GOAL #4   Title Patient report ability to stand for 30 minutes or longer without reports of increased low back pain in order to  improve her ability to perform cooking and other standing ADLs without pain and discomfort    Baseline Able to stand 20 minutes prior to onset of low back pain, 5/30: able to stand 2 hours    Time 8    Period Weeks    Status Achieved    Target Date 07/05/21              Plan -     Clinical Impression Statement Pt able to advance all strengthening interventions by either increasing level of resistance used or number  of reps performed. She had no pain throughout. While pt showed progress she still fatigues quickly with STS and heel raises. Will continue to focus on improving endurance in these areas. The pt will benefit from further skilled PT to improve pain, strength and mobility.    Personal Factors and Comorbidities Comorbidity 3+;Time since onset of injury/illness/exacerbation    Comorbidities DM type II, severe diabetic neuropathy, HTN, arthritis, DDD, GERD, brain tumor    Examination-Activity Limitations Bend;Caring for Others;Carry;Reach Overhead;Squat;Stairs;Stand;Transfers    Examination-Participation Restrictions Cleaning;Community Activity;Meal Prep;Yard Work    [employer] Evolving/Moderate complexity    Rehab Potential Fair    Clinical Impairments Affecting Rehab Potential medical comorbidities, chronicity    PT Frequency 2x / week    PT Duration 8 weeks    PT Treatment/Interventions ADLs/Self Care Home Management;Aquatic Therapy;Biofeedback;Cryotherapy;Electrical Stimulation;Iontophoresis 4mg /ml Dexamethasone;Moist Heat;Traction;Ultrasound;DME Instruction;Gait training;Stair training;Functional mobility training;Therapeutic activities;Therapeutic exercise;Balance training;Neuromuscular re-education;Patient/family education;Cognitive remediation;Orthotic Fit/Training;Manual techniques;Passive range of motion;Compression bandaging;Energy conservation;Splinting;Taping;Visual/perceptual remediation/compensation;Dry needling;Spinal Manipulations    PT Next  Visit Plan Initiate physical therapy interventions as well as provide home exercise program including low back stretches which may include piriformis stretch, knee-to-chest stretch, and hip strengthening exercises. continue plan    PT Home Exercise Plan no updates    Consulted and Agree with Plan of Care Patient               Zollie Pee, PT 07/10/2021, 10:53 AM    [place] MAIN [facility] SERVICES [address] [place], [name], [address] Phone: [phone]   Fax:  [phone]

## 2021-07-15 ENCOUNTER — Ambulatory Visit: Payer: Medicare Other

## 2021-07-15 DIAGNOSIS — M5459 Other low back pain: Secondary | ICD-10-CM

## 2021-07-15 DIAGNOSIS — M6281 Muscle weakness (generalized): Secondary | ICD-10-CM

## 2021-07-15 DIAGNOSIS — R2689 Other abnormalities of gait and mobility: Secondary | ICD-10-CM

## 2021-07-15 DIAGNOSIS — R262 Difficulty in walking, not elsewhere classified: Secondary | ICD-10-CM

## 2021-07-16 ENCOUNTER — Telehealth: Payer: Self-pay | Admitting: Obstetrics and Gynecology

## 2021-07-16 ENCOUNTER — Encounter: Payer: Medicare Other | Admitting: Physical Therapy

## 2021-07-17 ENCOUNTER — Ambulatory Visit: Payer: Medicare Other

## 2021-07-17 DIAGNOSIS — M6281 Muscle weakness (generalized): Secondary | ICD-10-CM

## 2021-07-17 DIAGNOSIS — M5459 Other low back pain: Secondary | ICD-10-CM

## 2021-07-17 NOTE — Therapy (Signed)
[place] [facility] MAIN [facility] SERVICES [address] [place], [place], [address] Phone: [phone]   Fax:  [phone]  Patient Details  Name: Charlene [name] MRN: [id number] Date of Birth: 19-Sep-1960 Referring Provider:  Delano Metz, MD  Encounter Date: 07/17/2021   OUTPATIENT PHYSICAL THERAPY TREATMENT NOTE   Patient Name: Charlene [name] MRN: [id number] DOB:1960-05-08, 61 y.o., female Today's Date: 07/17/2021  PCP: Barbette Reichmann, MD REFERRING PROVIDER: Delano Metz, MD   PT End of Session - 07/17/21 1105     Visit Number 18    Number of Visits 31   updated 6/21 to reflect recert   Date for PT Re-Evaluation 09/02/21    Progress Note Due on Visit 10    PT Start Time 0906    PT Stop Time 0953    PT Time Calculation (min) 47 min    Equipment Utilized During Treatment Gait belt    Activity Tolerance Patient tolerated treatment well;No increased pain    Behavior During Therapy WFL for tasks assessed/performed              Past Medical History:  Diagnosis Date   Arthritis    knees   Degenerative disc disease, lumbar    Diabetes mellitus without complication (HCC)    GERD (gastroesophageal reflux disease)    Hypertension    Neuropathy    left lower leg   Polyneuropathy    Polyneuropathy    Past Surgical History:  Procedure Laterality Date   ABDOMINAL HYSTERECTOMY     arthroscopic rotator cuff     CATARACT EXTRACTION W/PHACO Right 11/25/2017   Procedure: CATARACT EXTRACTION PHACO AND INTRAOCULAR LENS PLACEMENT (IOC) RIGHT DIABETIC;  Surgeon: Lockie Mola, MD;  Location: [facility];  Service: Ophthalmology;  Laterality: Right;  Diabetic - insulin and oral meds   COLONOSCOPY     endosopic sinus     Patient Active Problem List   Diagnosis Date Noted   Chronic sacroiliac joint pain (Right) 03/11/2021   Diabetic retinopathy (HCC) 12/17/2020   Hypertensive retinopathy of both eyes 12/17/2020   NPDR  (nonproliferative diabetic retinopathy) (HCC) 12/17/2020   Pseudophakia of right eye 12/17/2020   Disease of thyroid gland 12/17/2020   Abnormal drug screen (09/03/2020) 12/06/2020   Pain medication agreement broken (12/06/2020) 12/06/2020   History of cocaine use (09/03/2020) 12/06/2020   History of illicit drug use (09/03/2020) 12/06/2020   Substance use disorder 12/06/2020   Spondylosis without myelopathy or radiculopathy, lumbosacral region 10/09/2020   Chronic low back pain (Bilateral) w/o sciatica 10/09/2020   Chronic ankle and foot pain (1ry area of Pain) (Left) 09/03/2020   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) 09/03/2020   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) 09/03/2020   Chronic use of opiate for therapeutic purpose 07/08/2020   Pain medication agreement signed 11/08/2019   Meningioma (HCC) 11/01/2019   Hx of resection of meningioma 09/14/2019   Osteoporosis 08/05/2019   Neck pain 03/11/2019   Knee pain 11/18/2018   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) 10/06/2018   Osteoarthritis involving multiple joints 08/04/2018   Type 2 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, with long-term current use of insulin (HCC) 03/22/2018   Abnormal EMG (07/06/2017) 11/23/2017   Long-term insulin use (HCC) 11/10/2017   Uncontrolled type 2 diabetes mellitus with hyperglycemia (HCC) 11/10/2017   Vitamin D deficiency 10/21/2017   DM type 2 with diabetic peripheral neuropathy (HCC) 10/21/2017  DDD (degenerative disc disease), lumbar 10/21/2017   Lumbar facet arthropathy 10/21/2017   Lumbar facet syndrome 10/21/2017   Lumbar facet hypertrophy 10/21/2017   Lumbar foraminal stenosis (L5-S1) (Right) 10/21/2017   Osteoarthritis of knee (Bilateral) 10/21/2017   Chronic musculoskeletal pain 10/21/2017   Arthropathy of lumbar facet joint 10/21/2017   Essential hypertension 10/12/2017   Chronic  ankle pain (Left) 10/12/2017   Chronic foot pain (Left) 10/12/2017   Chronic lower extremity pain (2ry area of Pain) (Bilateral) (L>R) 10/12/2017   Chronic knee pain (4th area of Pain) (Bilateral) (L>R) 10/12/2017   Chronic low back pain (3ry area of Pain) (Bilateral) (L>R) w/ sciatica (Left) 10/12/2017   Chronic pain syndrome 10/12/2017   Opiate use 10/12/2017   Pharmacologic therapy 10/12/2017   Disorder of skeletal system 10/12/2017   Problems influencing health status 10/12/2017   Chronic pain disorder 10/12/2017   Neuropathy 08/11/2017   Burning sensation of feet 07/06/2017   Lower extremity numbness and tingling (Left) 07/06/2017   Left leg weakness 07/06/2017   Pain in both lower extremities 06/24/2017   Polyneuropathy associated with underlying disease ([place]) 05/29/2017   DM type 2, uncontrolled, with neuropathy 05/05/2017   Back pain with left-sided sciatica 10/16/2015   Meralgia paraesthetica, left 10/16/2015   Neuropathic pain 06/29/2015   GERD without esophagitis 01/16/2014   Diabetes mellitus ([place]) 01/21/2003    REFERRING DIAG: Chronic bilateral low back pain without sciatica, Lumbar facet joint syndrome  THERAPY DIAG:  Muscle weakness (generalized)  Other low back pain  Rationale for Evaluation and Treatment Rehabilitation  PERTINENT HISTORY: Pt reports she has had back pain chronically and she has to get injections every 4-5 months for management of low back pain. Pt lives in [place] which is about a 45 min drive from clinic. Pt ambulates with cane to clinic and has been ambulating with cane for 3 years. Pt reports no falls in the last 6 months but has had some incidences of loss of balance where she feels she is leaning posteriorlly. Pt reports her lower back pain is worse when she has to stand for prolonged periods but she reports she is able to walk for prolonged periods without back pain but has some pain in the legs with prolonged walking. Pt reports pain  in the area of her low back that wraps following her weist line and no complaints of radiating pain down her legs. Pt back pain began around 5 years ago and was a gradual progression in pain and discomfort. Pt reports standing on hard floors for many years of working may have contributing. 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 Per last MD visit pertintant diagnoses includeIndications: Low back pain severe enough to impact quality of life or function, Lumbar facet syndrome, Lumbar facet hypertrophy, Spondylosis without myelopathy or radiculopathy, lumbosacral region, 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), Lumbar facet arthropathy, DDD (degenerative disc disease), lumbar, Chronic low back pain (Bilateral) w/o sciatica  PRECAUTIONS: fall  SUBJECTIVE: Pt reports increased low back pain. Yesterday she was visiting family. Her grandson was playing basketball and the basketball accidentally hit her back. Afterwards her pain increased. At rest she rates pain 0/10 currently.  PAIN:  Are you having pain? No    TODAY'S TREATMENT:   07/17/21   Nustep lvl 0 x 3 min, lvl 2 x 2 min. Pt reports no  pain with intervention (unbilled).  STS 1x12, 1x15 sets. Pt rates first set as easy. Pt rates second set challenging   Seated: Seated hamstring stretch 2x30 sec each LE Seated figure four stretch 2x30  sec each LE  Seated with # 4 ankle weights  -Seated marches with upright posture, back away from back of chair for abdominal/trunk activation/stabilization, 20x alt LE, 15x each LE -Seated LAQ with 1 second holds, 15x each LE -Seated IR/ER 15x each LE. Pball adductor squeeze with IR to promote correct technique. Pt rates interventions as easy  Standing with # 4 ankle weight: CGA for stability  2 sets of each intervention: -Hip  extension with B upper extremity support 10x each LE,  -Hip abduction with B upper extremity support 10x each LE -Hip flexion with B upper extremity support 10x each LE -Hamstring curl with B upper extremity support, cueing for knee alignment for recruitment of hamstring musculature, 10x each LE  Seated  Pball rollouts FWD/BCKWD, diagonally  8 reps of each bilat Seated thoracic ext over chair 10x Seated trunk twists 6x   PATIENT EDUCATION:  Education details: exercise technique, body mechanics  Person educated: Patient Education method: Consulting civil engineer, Demonstration, and Verbal cues Education comprehension: verbalized understanding, returned demonstration, and needs further education   HOME EXERCISE PROGRAM:  No updates 07/17/2021 From 05/14/2021: Initiated HEP: Instructed patient in lumbar/hip stretches as well as core strengthening exercise to help with long term management of back pain   PT Short Term Goals -       PT SHORT TERM GOAL #1   Title Patient will be independent in home exercise program to improve strength/mobility for better functional independence with ADLs.     Baseline No low back HEP, 5/20: adherent, does them 2x a day    Time 4    Period Weeks    Status Achieved    Target Date 06/07/21              PT Long Term Goals -       PT LONG TERM GOAL #1   Title Patient will reduce modified Oswestry score to 20% as to demonstrate minimal disability with ADLs including improved sleeping tolerance, walking/sitting tolerance etc for better mobility with ADLs.    Baseline 05/10/21: 20/50 / 40%, 5/30: 20%    Time 8    Period Weeks    Status Achieved    Target Date 07/05/21      PT LONG TERM GOAL #2   Title Patient will increase FOTO score to equal to or greater than  55%   to demonstrate statistically significant improvement in mobility and quality of life.    Baseline 5/30: 53%; 6/19: 63%    Time 8    Period Weeks    Status Achieved    Target Date 09/02/21       PT LONG TERM GOAL #3   Title Patient will improve bilateral hip strength to 4+ out of 5 or greater for hip abduction, flexion, and extension in order to improve hip muscle strength and stability for prolonged standing    Baseline See initial evaluation, 5/30: see notel 6/19: B hip strength grossly 4+/5 with exception of ext. 4/5, PF/DF 4-/5 on L    Time 8    Period Weeks    Status Partially Met    Target Date 09/02/21      PT LONG TERM GOAL #4   Title Patient report ability to stand for 30 minutes or longer without reports  of increased low back pain in order to improve her ability to perform cooking and other standing ADLs without pain and discomfort    Baseline Able to stand 20 minutes prior to onset of low back pain, 5/30: able to stand 2 hours    Time 8    Period Weeks    Status Achieved    Target Date 07/05/21              Plan -     Clinical Impression Statement Patient continues with excellent motivation throughout session. Pt able to advance level of resistance with all strengthening therex, rating exercises from easy-challenging. Pt without pain throughout. The pt will benefit from further skilled PT to improve pain, strength and mobility.    Personal Factors and Comorbidities Comorbidity 3+;Time since onset of injury/illness/exacerbation    Comorbidities DM type II, severe diabetic neuropathy, HTN, arthritis, DDD, GERD, brain tumor    Examination-Activity Limitations Bend;Caring for Others;Carry;Reach Overhead;Squat;Stairs;Stand;Transfers    Examination-Participation Restrictions Cleaning;Community Activity;Meal Prep;Yard Work    Merchant navy officer Evolving/Moderate complexity    Rehab Potential Fair    Clinical Impairments Affecting Rehab Potential medical comorbidities, chronicity    PT Frequency 2x / week    PT Duration 8 weeks    PT Treatment/Interventions ADLs/Self Care Home Management;Aquatic Therapy;Biofeedback;Cryotherapy;Electrical  Stimulation;Iontophoresis 4mg /ml Dexamethasone;Moist Heat;Traction;Ultrasound;DME Instruction;Gait training;Stair training;Functional mobility training;Therapeutic activities;Therapeutic exercise;Balance training;Neuromuscular re-education;Patient/family education;Cognitive remediation;Orthotic Fit/Training;Manual techniques;Passive range of motion;Compression bandaging;Energy conservation;Splinting;Taping;Visual/perceptual remediation/compensation;Dry needling;Spinal Manipulations    PT Next Visit Plan Initiate physical therapy interventions as well as provide home exercise program including low back stretches which may include piriformis stretch, knee-to-chest stretch, and hip strengthening exercises. continue plan    PT Home Exercise Plan no updates    Consulted and Agree with Plan of Care Patient             Ricard Dillon PT, DPT   07/17/2021, 11:14 AM    [place] MAIN [facility] SERVICES [address] [place], [name], [address] Phone: [phone]   Fax:  [phone]

## 2021-07-19 ENCOUNTER — Encounter: Payer: Medicare Other | Admitting: Physical Therapy

## 2021-07-22 ENCOUNTER — Encounter: Payer: Medicare Other | Admitting: Physical Therapy

## 2021-07-22 ENCOUNTER — Ambulatory Visit: Payer: Medicare Other | Attending: Pain Medicine

## 2021-07-22 DIAGNOSIS — M6281 Muscle weakness (generalized): Secondary | ICD-10-CM | POA: Insufficient documentation

## 2021-07-22 DIAGNOSIS — M5459 Other low back pain: Secondary | ICD-10-CM | POA: Insufficient documentation

## 2021-07-22 DIAGNOSIS — R262 Difficulty in walking, not elsewhere classified: Secondary | ICD-10-CM | POA: Insufficient documentation

## 2021-07-24 ENCOUNTER — Telehealth: Payer: Self-pay | Admitting: Physical Therapy

## 2021-07-24 ENCOUNTER — Ambulatory Visit: Payer: Medicare Other | Admitting: Physical Therapy

## 2021-07-24 NOTE — Telephone Encounter (Signed)
Pt contacted via telephone and author left voice mail informing of missed appointment and informed pt of future PT appointment date and time.   Wyolene Weimann PT, DPT   

## 2021-07-25 NOTE — Therapy (Signed)
[place] MAIN [facility] SERVICES [address] [place], [name], [address] Phone: [phone]   Fax:  [phone]  Patient Details  Name: Charlene [name] MRN: [id number] Date of Birth: [birth year] Referring Provider:  Milinda Pointer, MD  Encounter Date: 07/29/2021   OUTPATIENT PHYSICAL THERAPY TREATMENT NOTE   Patient Name: Charlene [name] MRN: [id number] DOB:[birth year], 61 y.o., female Today's Date: 07/29/2021  PCP: Tracie Harrier, MD REFERRING PROVIDER: Milinda Pointer, MD   PT End of Session - 07/29/21 0847     Visit Number 19    Number of Visits 31   updated 7/00 to reflect recert   Date for PT Re-Evaluation 09/02/21    Progress Note Due on Visit 10    PT Start Time 0845    PT Stop Time 0929    PT Time Calculation (min) 44 min    Equipment Utilized During Treatment Gait belt    Activity Tolerance Patient tolerated treatment well;No increased pain    Behavior During Therapy WFL for tasks assessed/performed               Past Medical History:  Diagnosis Date   Arthritis    knees   Degenerative disc disease, lumbar    Diabetes mellitus without complication (HCC)    GERD (gastroesophageal reflux disease)    Hypertension    Neuropathy    left lower leg   Polyneuropathy    Polyneuropathy    Past Surgical History:  Procedure Laterality Date   ABDOMINAL HYSTERECTOMY     arthroscopic rotator cuff     CATARACT EXTRACTION W/PHACO Right 11/25/2017   Procedure: CATARACT EXTRACTION PHACO AND INTRAOCULAR LENS PLACEMENT ([place]) RIGHT DIABETIC;  Surgeon: Leandrew Koyanagi, MD;  Location: [place];  Service: Ophthalmology;  Laterality: Right;  Diabetic - insulin and oral meds   COLONOSCOPY     endosopic sinus     Patient Active Problem List   Diagnosis Date Noted   Chronic sacroiliac joint pain (Right) 03/11/2021   Diabetic retinopathy ([place]) 12/17/2020   Hypertensive retinopathy of both eyes 12/17/2020   NPDR  (nonproliferative diabetic retinopathy) ([place]) 12/17/2020   Pseudophakia of right eye 12/17/2020   Disease of thyroid gland 12/17/2020   Abnormal drug screen (09/03/2020) 12/06/2020   Pain medication agreement broken (12/06/2020) 12/06/2020   History of cocaine use (09/03/2020) 17/49/4496   History of illicit drug use (7/59/1638) 12/06/2020   Substance use disorder 12/06/2020   Spondylosis without myelopathy or radiculopathy, lumbosacral region 10/09/2020   Chronic low back pain (Bilateral) w/o sciatica 10/09/2020   Chronic ankle and foot pain (1ry area of Pain) (Left) 09/03/2020   Diabetic sensory polyneuropathy ([place]) (by NCT) 09/03/2020   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) 09/03/2020   Chronic use of opiate for therapeutic purpose 07/08/2020   Pain medication agreement signed 11/08/2019   Meningioma ([place]) 11/01/2019   Hx of resection of meningioma 09/14/2019   Osteoporosis 08/05/2019   Neck pain 03/11/2019   Knee pain 11/18/2018   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[place]) 10/06/2018   Osteoarthritis involving multiple joints 08/04/2018   Type 2 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, with long-term current use of insulin ([place]) 03/22/2018   Abnormal EMG (07/06/2017) 11/23/2017   Long-term insulin use ([place]) 11/10/2017   Uncontrolled type 2 diabetes mellitus with hyperglycemia ([place]) 11/10/2017   Vitamin D deficiency 10/21/2017   DM type 2 with diabetic peripheral neuropathy ([place]) 10/21/2017  DDD (degenerative disc disease), lumbar 10/21/2017   Lumbar facet arthropathy 10/21/2017   Lumbar facet syndrome 10/21/2017   Lumbar facet hypertrophy 10/21/2017   Lumbar foraminal stenosis (L5-S1) (Right) 10/21/2017   Osteoarthritis of knee (Bilateral) 10/21/2017   Chronic musculoskeletal pain 10/21/2017   Arthropathy of lumbar facet joint 10/21/2017   Essential hypertension 10/12/2017   Chronic  ankle pain (Left) 10/12/2017   Chronic foot pain (Left) 10/12/2017   Chronic lower extremity pain (2ry area of Pain) (Bilateral) (L>R) 10/12/2017   Chronic knee pain (4th area of Pain) (Bilateral) (L>R) 10/12/2017   Chronic low back pain (3ry area of Pain) (Bilateral) (L>R) w/ sciatica (Left) 10/12/2017   Chronic pain syndrome 10/12/2017   Opiate use 10/12/2017   Pharmacologic therapy 10/12/2017   Disorder of skeletal system 10/12/2017   Problems influencing health status 10/12/2017   Chronic pain disorder 10/12/2017   Neuropathy 08/11/2017   Burning sensation of feet 07/06/2017   Lower extremity numbness and tingling (Left) 07/06/2017   Left leg weakness 07/06/2017   Pain in both lower extremities 06/24/2017   Polyneuropathy associated with underlying disease ([place]) 05/29/2017   DM type 2, uncontrolled, with neuropathy 05/05/2017   Back pain with left-sided sciatica 10/16/2015   Meralgia paraesthetica, left 10/16/2015   Neuropathic pain 06/29/2015   GERD without esophagitis 01/16/2014   Diabetes mellitus ([place]) 01/21/2003    REFERRING DIAG: Chronic bilateral low back pain without sciatica, Lumbar facet joint syndrome  THERAPY DIAG:  Muscle weakness (generalized)  Other low back pain  Difficulty in walking, not elsewhere classified  Rationale for Evaluation and Treatment Rehabilitation  PERTINENT HISTORY: Pt reports she has had back pain chronically and she has to get injections every 4-5 months for management of low back pain. Pt lives in [place] which is about a 45 min drive from clinic. Pt ambulates with cane to clinic and has been ambulating with cane for 3 years. Pt reports no falls in the last 6 months but has had some incidences of loss of balance where she feels she is leaning posteriorlly. Pt reports her lower back pain is worse when she has to stand for prolonged periods but she reports she is able to walk for prolonged periods without back pain but has some pain in  the legs with prolonged walking. Pt reports pain in the area of her low back that wraps following her weist line and no complaints of radiating pain down her legs. Pt back pain began around 5 years ago and was a gradual progression in pain and discomfort. Pt reports standing on hard floors for many years of working may have contributing. 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 Per last MD visit pertintant diagnoses includeIndications: Low back pain severe enough to impact quality of life or function, Lumbar facet syndrome, Lumbar facet hypertrophy, Spondylosis without myelopathy or radiculopathy, lumbosacral region, 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), Lumbar facet arthropathy, DDD (degenerative disc disease), lumbar, Chronic low back pain (Bilateral) w/o sciatica  PRECAUTIONS: fall  SUBJECTIVE: Patient reports she went to a two family reunion since last session. No falls or LOB since last session.   PAIN:  Are you having pain? No    TODAY'S TREATMENT:       STS 1x15, x2 trials   Seated: Seated hamstring stretch 2x30 sec each LE Seated figure four stretch 2x30  sec each LE  Seated with # 4 ankle weights  -Seated marches with upright posture, back away from back of chair for abdominal/trunk activation/stabilization,  15x each LE -Seated LAQ with 3 second holds, 15x each LE -Seated IR/ER 15x each LE. Pball adductor squeeze with IR to promote correct technique. Pt rates interventions as easy  Standing with # 4 ankle weight: CGA for stability  2 sets of each intervention: -Hip extension with B upper extremity support 10x each LE,  -Hip abduction with B upper extremity support 10x each LE -Hip flexion with B upper extremity support 10x each LE -Heel raise 15x; with UE support  Standing: 6"  step: -step up/down 10x each LE -lateral step up/down 10x each LE; cue for decreasing UE support -eccentric heel tap 10x each LE; heavy BUE support   Seated Seated thoracic ext over chair 10x Seated trunk twists 6x   PATIENT EDUCATION:  Education details: exercise technique, body mechanics  Person educated: Patient Education method: Consulting civil engineer, Demonstration, and Verbal cues Education comprehension: verbalized understanding, returned demonstration, and needs further education   HOME EXERCISE PROGRAM:  No updates 07/17/2021 From 05/14/2021: Initiated HEP: Instructed patient in lumbar/hip stretches as well as core strengthening exercise to help with long term management of back pain   PT Short Term Goals -       PT SHORT TERM GOAL #1   Title Patient will be independent in home exercise program to improve strength/mobility for better functional independence with ADLs.     Baseline No low back HEP, 5/20: adherent, does them 2x a day    Time 4    Period Weeks    Status Achieved    Target Date 06/07/21              PT Long Term Goals -       PT LONG TERM GOAL #1   Title Patient will reduce modified Oswestry score to 20% as to demonstrate minimal disability with ADLs including improved sleeping tolerance, walking/sitting tolerance etc for better mobility with ADLs.    Baseline 05/10/21: 20/50 / 40%, 5/30: 20%    Time 8    Period Weeks    Status Achieved    Target Date 07/05/21      PT LONG TERM GOAL #2   Title Patient will increase FOTO score to equal to or greater than  55%   to demonstrate statistically significant improvement in mobility and quality of life.    Baseline 5/30: 53%; 6/19: 63%    Time 8    Period Weeks    Status Achieved    Target Date 09/02/21      PT LONG TERM GOAL #3   Title Patient will improve bilateral hip strength to 4+ out of 5 or greater for hip abduction, flexion, and extension in order to improve hip muscle strength and stability for  prolonged standing    Baseline See initial evaluation, 5/30: see notel 6/19: B hip strength grossly 4+/5 with exception of ext. 4/5, PF/DF 4-/5 on L    Time 8    Period Weeks    Status Partially Met    Target Date 09/02/21      PT LONG TERM GOAL #4   Title Patient report ability to stand for 30 minutes or longer without reports of increased low back pain in order to improve her ability to perform cooking and other standing ADLs without pain and discomfort    Baseline Able to stand 20 minutes prior  to onset of low back pain, 5/30: able to stand 2 hours    Time 8    Period Weeks    Status Achieved    Target Date 07/05/21              Plan -     Clinical Impression Statement Patient presents with excellent motivation. She does require intermittent rest breaks. She does utilize Ue's for standing interventions for stabilization and reports no pain just fatigue by end of session.  The pt will benefit from further skilled PT to improve pain, strength and mobility.    Personal Factors and Comorbidities Comorbidity 3+;Time since onset of injury/illness/exacerbation    Comorbidities DM type II, severe diabetic neuropathy, HTN, arthritis, DDD, GERD, brain tumor    Examination-Activity Limitations Bend;Caring for Others;Carry;Reach Overhead;Squat;Stairs;Stand;Transfers    Examination-Participation Restrictions Cleaning;Community Activity;Meal Prep;Yard Work    Merchant navy officer Evolving/Moderate complexity    Rehab Potential Fair    Clinical Impairments Affecting Rehab Potential medical comorbidities, chronicity    PT Frequency 2x / week    PT Duration 8 weeks    PT Treatment/Interventions ADLs/Self Care Home Management;Aquatic Therapy;Biofeedback;Cryotherapy;Electrical Stimulation;Iontophoresis 4mg /ml Dexamethasone;Moist Heat;Traction;Ultrasound;DME Instruction;Gait training;Stair training;Functional mobility training;Therapeutic activities;Therapeutic exercise;Balance  training;Neuromuscular re-education;Patient/family education;Cognitive remediation;Orthotic Fit/Training;Manual techniques;Passive range of motion;Compression bandaging;Energy conservation;Splinting;Taping;Visual/perceptual remediation/compensation;Dry needling;Spinal Manipulations    PT Next Visit Plan Initiate physical therapy interventions as well as provide home exercise program including low back stretches which may include piriformis stretch, knee-to-chest stretch, and hip strengthening exercises. continue plan    PT Home Exercise Plan no updates    Consulted and Agree with Plan of Care Patient             Janna Arch, PT, DPT    07/29/2021, 9:30 AM    [place] MAIN [facility] SERVICES [address] [place], [name], [address] Phone: [phone]   Fax:  [phone]

## 2021-07-29 ENCOUNTER — Ambulatory Visit: Payer: Medicare Other

## 2021-07-29 DIAGNOSIS — M6281 Muscle weakness (generalized): Secondary | ICD-10-CM

## 2021-07-29 DIAGNOSIS — M5459 Other low back pain: Secondary | ICD-10-CM

## 2021-07-29 DIAGNOSIS — R262 Difficulty in walking, not elsewhere classified: Secondary | ICD-10-CM

## 2021-07-31 ENCOUNTER — Ambulatory Visit: Payer: Medicare Other | Admitting: Physical Therapy

## 2021-07-31 ENCOUNTER — Encounter: Payer: Self-pay | Admitting: Physical Therapy

## 2021-07-31 DIAGNOSIS — M6281 Muscle weakness (generalized): Secondary | ICD-10-CM

## 2021-07-31 DIAGNOSIS — M5459 Other low back pain: Secondary | ICD-10-CM

## 2021-07-31 DIAGNOSIS — R262 Difficulty in walking, not elsewhere classified: Secondary | ICD-10-CM

## 2021-07-31 NOTE — Therapy (Signed)
[facility] [address] [place], [name], [address] Phone: [phone]   Fax:  [phone]  Patient Details  Name: Charlene [name] MRN: [id number] Date of Birth: 1960-10-12 Referring Provider:  Milinda Pointer, MD  Encounter Date: 07/31/2021   OUTPATIENT PHYSICAL THERAPY TREATMENT NOTE/ Discharge Summary    Patient Name: Charlene [name] MRN: [id number] DOB:04-21-1960, 61 y.o., female Today's Date: 07/31/2021  PCP: Tracie Harrier, MD REFERRING PROVIDER: Milinda Pointer, MD   PT End of Session - 07/31/21 0853     Visit Number 20    Number of Visits 31   updated 1/69 to reflect recert   Date for PT Re-Evaluation 09/02/21    Progress Note Due on Visit 10    PT Start Time 0853    PT Stop Time 0925    PT Time Calculation (min) 32 min    Equipment Utilized During Treatment Gait belt    Activity Tolerance Patient tolerated treatment well;No increased pain    Behavior During Therapy WFL for tasks assessed/performed               Past Medical History:  Diagnosis Date   Arthritis    knees   Degenerative disc disease, lumbar    Diabetes mellitus without complication (HCC)    GERD (gastroesophageal reflux disease)    Hypertension    Neuropathy    left lower leg   Polyneuropathy    Polyneuropathy    Past Surgical History:  Procedure Laterality Date   ABDOMINAL HYSTERECTOMY     arthroscopic rotator cuff     CATARACT EXTRACTION W/PHACO Right 11/25/2017   Procedure: CATARACT EXTRACTION PHACO AND INTRAOCULAR LENS PLACEMENT ([place]) RIGHT DIABETIC;  Surgeon: Leandrew Koyanagi, MD;  Location: [facility];  Service: Ophthalmology;  Laterality: Right;  Diabetic - insulin and oral meds   COLONOSCOPY     endosopic sinus     Patient Active Problem List   Diagnosis Date Noted   Chronic sacroiliac joint pain (Right) 03/11/2021   Diabetic retinopathy ([place]) 12/17/2020   Hypertensive retinopathy of both  eyes 12/17/2020   NPDR (nonproliferative diabetic retinopathy) ([place]) 12/17/2020   Pseudophakia of right eye 12/17/2020   Disease of thyroid gland 12/17/2020   Abnormal drug screen (09/03/2020) 12/06/2020   Pain medication agreement broken (12/06/2020) 12/06/2020   History of cocaine use (09/03/2020) 45/03/8880   History of illicit drug use (8/00/3491) 12/06/2020   Substance use disorder 12/06/2020   Spondylosis without myelopathy or radiculopathy, lumbosacral region 10/09/2020   Chronic low back pain (Bilateral) w/o sciatica 10/09/2020   Chronic ankle and foot pain (1ry area of Pain) (Left) 09/03/2020   Diabetic sensory polyneuropathy ([place]) (by NCT) 09/03/2020   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) 09/03/2020   Chronic use of opiate for therapeutic purpose 07/08/2020   Pain medication agreement signed 11/08/2019   Meningioma ([place]) 11/01/2019   Hx of resection of meningioma 09/14/2019   Osteoporosis 08/05/2019   Neck pain 03/11/2019   Knee pain 11/18/2018   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[place]) 10/06/2018   Osteoarthritis involving multiple joints 08/04/2018   Type 2 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, with long-term current use of insulin ([place]) 03/22/2018   Abnormal EMG (07/06/2017) 11/23/2017   Long-term insulin use ([place]) 11/10/2017   Uncontrolled type 2 diabetes mellitus with hyperglycemia ([place]) 11/10/2017   Vitamin D deficiency 10/21/2017   DM type 2 with diabetic peripheral  neuropathy ([place]) 10/21/2017   DDD (degenerative disc disease), lumbar 10/21/2017   Lumbar facet arthropathy 10/21/2017   Lumbar facet syndrome 10/21/2017   Lumbar facet hypertrophy 10/21/2017   Lumbar foraminal stenosis (L5-S1) (Right) 10/21/2017   Osteoarthritis of knee (Bilateral) 10/21/2017   Chronic musculoskeletal pain 10/21/2017   Arthropathy of lumbar facet joint 10/21/2017   Essential hypertension  10/12/2017   Chronic ankle pain (Left) 10/12/2017   Chronic foot pain (Left) 10/12/2017   Chronic lower extremity pain (2ry area of Pain) (Bilateral) (L>R) 10/12/2017   Chronic knee pain (4th area of Pain) (Bilateral) (L>R) 10/12/2017   Chronic low back pain (3ry area of Pain) (Bilateral) (L>R) w/ sciatica (Left) 10/12/2017   Chronic pain syndrome 10/12/2017   Opiate use 10/12/2017   Pharmacologic therapy 10/12/2017   Disorder of skeletal system 10/12/2017   Problems influencing health status 10/12/2017   Chronic pain disorder 10/12/2017   Neuropathy 08/11/2017   Burning sensation of feet 07/06/2017   Lower extremity numbness and tingling (Left) 07/06/2017   Left leg weakness 07/06/2017   Pain in both lower extremities 06/24/2017   Polyneuropathy associated with underlying disease ([place]) 05/29/2017   DM type 2, uncontrolled, with neuropathy 05/05/2017   Back pain with left-sided sciatica 10/16/2015   Meralgia paraesthetica, left 10/16/2015   Neuropathic pain 06/29/2015   GERD without esophagitis 01/16/2014   Diabetes mellitus ([place]) 01/21/2003    REFERRING DIAG: Chronic bilateral low back pain without sciatica, Lumbar facet joint syndrome  THERAPY DIAG:  No diagnosis found.  Rationale for Evaluation and Treatment Rehabilitation  PERTINENT HISTORY: Pt reports she has had back pain chronically and she has to get injections every 4-5 months for management of low back pain. Pt lives in [place] which is about a 45 min drive from clinic. Pt ambulates with cane to clinic and has been ambulating with cane for 3 years. Pt reports no falls in the last 6 months but has had some incidences of loss of balance where she feels she is leaning posteriorlly. Pt reports her lower back pain is worse when she has to stand for prolonged periods but she reports she is able to walk for prolonged periods without back pain but has some pain in the legs with prolonged walking. Pt reports pain in the area  of her low back that wraps following her weist line and no complaints of radiating pain down her legs. Pt back pain began around 5 years ago and was a gradual progression in pain and discomfort. Pt reports standing on hard floors for many years of working may have contributing. 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 Per last MD visit pertintant diagnoses includeIndications: Low back pain severe enough to impact quality of life or function, Lumbar facet syndrome, Lumbar facet hypertrophy, Spondylosis without myelopathy or radiculopathy, lumbosacral region, 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), Lumbar facet arthropathy, DDD (degenerative disc disease), lumbar, Chronic low back pain (Bilateral) w/o sciatica  PRECAUTIONS: fall  SUBJECTIVE: Patient reports she went to a two family reunion since last session. No falls or LOB since last session.   PAIN:  Are you having pain? No    TODAY'S TREATMENT:  07/31/21 Physical therapy treatment session today consisted of completing assessment of goals and administration of testing as demonstrated in flow sheet. Addition treatments may be found below.  TE:  5X STS 13.3 sec no UE assist, good form without significant difficulty   STS 1x15, no UE A    Seated: Seated hamstring stretch 2x30 sec each LE Seated figure four stretch 2x30  sec each LE  Seated with # 4 ankle weights  -Seated LAQ with 3 second holds, 2*10x each LE -Seated ER 15x  Blue TB   Standing with # 4 ankle weight: UE assist  for stability  2 sets of each intervention: -Hip extension with B upper extremity support 10x each LE,  -Hip abduction with B upper extremity support 10x each LE -Hip flexion with B upper extremity support 10x each LE -Heel raise 15x; with UE support     PATIENT  EDUCATION:  Education details:   Person educated: Patient Education method: Consulting civil engineer, Media planner, and Verbal cues Education comprehension: verbalized understanding, returned demonstration, and needs further education   HOME EXERCISE PROGRAM:  No updates 07/17/2021 From 05/14/2021: Initiated HEP: Instructed patient in lumbar/hip stretches as well as core strengthening exercise to help with long term management of back pain   PT Short Term Goals -       PT SHORT TERM GOAL #1   Title Patient will be independent in home exercise program to improve strength/mobility for better functional independence with ADLs.     Baseline No low back HEP, 5/20: adherent, does them 2x a day    Time 4    Period Weeks    Status Achieved    Target Date 06/07/21              PT Long Term Goals -       PT LONG TERM GOAL #1   Title Patient will reduce modified Oswestry score to 20% as to demonstrate minimal disability with ADLs including improved sleeping tolerance, walking/sitting tolerance etc for better mobility with ADLs.    Baseline 05/10/21: 20/50 / 40%, 5/30: 20%    Time 8    Period Weeks    Status Achieved    Target Date 07/05/21      PT LONG TERM GOAL #2   Title Patient will increase FOTO score to equal to or greater than  55%   to demonstrate statistically significant improvement in mobility and quality of life.    Baseline 5/30: 53%; 6/19: 63%    Time 8    Period Weeks    Status Achieved    Target Date 09/02/21      PT LONG TERM GOAL #3   Title Patient will improve bilateral hip strength to 4+ out of 5 or greater for hip abduction, flexion, and extension in order to improve hip muscle strength and stability for prolonged standing    Baseline See initial evaluation, 5/30: see notel 6/19: B hip strength grossly 4+/5 with exception of ext. 4/5, PF/DF 4-/5 on L    Time 8    Period Weeks    Status Met    Target Date 09/02/21      PT LONG TERM GOAL #4   Title Patient report  ability to stand for 30 minutes or longer without reports of increased low back pain in order to improve her ability to perform cooking and other standing ADLs without pain and discomfort    Baseline Able to stand 20 minutes prior to onset of low back pain, 5/30: able to stand 2 hours    Time 8    Period Weeks    Status Achieved    Target Date 07/05/21  Plan -     Clinical Impression Statement Patient presents to physical therapy this date for progress note.  Patient has achieved all goals from her initial evaluation as is happy with her current progress.  Patient does report some continued balance issues but her low back pain has significantly improved and only bothers her moderately and she feels her current stretching and strengthening program will continue to keep her low back pain from being exacerbated.  Patient informed of physical therapy services that may be provided for balance in the future if indicated.  Patient will be discharged to skilled physical therapy at this time and all questions and needs met.   Personal Factors and Comorbidities Comorbidity 3+;Time since onset of injury/illness/exacerbation    Comorbidities DM type II, severe diabetic neuropathy, HTN, arthritis, DDD, GERD, brain tumor    Examination-Activity Limitations Bend;Caring for Others;Carry;Reach Overhead;Squat;Stairs;Stand;Transfers    Examination-Participation Restrictions Cleaning;Community Activity;Meal Prep;Yard Work    Merchant navy officer Evolving/Moderate complexity    Rehab Potential Fair    Clinical Impairments Affecting Rehab Potential medical comorbidities, chronicity    PT Frequency 2x / week    PT Duration 8 weeks    PT Treatment/Interventions ADLs/Self Care Home Management;Aquatic Therapy;Biofeedback;Cryotherapy;Electrical Stimulation;Iontophoresis 63m/ml Dexamethasone;Moist Heat;Traction;Ultrasound;DME Instruction;Gait training;Stair training;Functional mobility  training;Therapeutic activities;Therapeutic exercise;Balance training;Neuromuscular re-education;Patient/family education;Cognitive remediation;Orthotic Fit/Training;Manual techniques;Passive range of motion;Compression bandaging;Energy conservation;Splinting;Taping;Visual/perceptual remediation/compensation;Dry needling;Spinal Manipulations    PT Next Visit Plan Initiate physical therapy interventions as well as provide home exercise program including low back stretches which may include piriformis stretch, knee-to-chest stretch, and hip strengthening exercises. continue plan    PT Home Exercise Plan no updates    Consulted and Agree with Plan of Care Patient             CRivka BarbaraPT, DPT      07/31/2021, 9:15 AM    [facility] [address] [place] [address]Phone: [phone]  Fax:  [phone]

## 2021-08-05 ENCOUNTER — Ambulatory Visit: Payer: Medicare Other | Admitting: Physical Therapy

## 2021-08-07 ENCOUNTER — Ambulatory Visit: Payer: Medicare Other | Admitting: Physical Therapy

## 2021-08-09 ENCOUNTER — Encounter: Payer: Medicare Other | Admitting: Obstetrics and Gynecology

## 2021-08-20 ENCOUNTER — Encounter: Payer: Medicare Other | Admitting: Physical Therapy

## 2021-08-22 ENCOUNTER — Encounter: Payer: Medicare Other | Admitting: Physical Therapy

## 2021-08-29 ENCOUNTER — Encounter: Payer: Medicare Other | Admitting: Physical Therapy

## 2021-09-03 ENCOUNTER — Encounter: Payer: Medicare Other | Admitting: Physical Therapy

## 2021-09-05 ENCOUNTER — Encounter: Payer: Medicare Other | Admitting: Physical Therapy

## 2021-09-12 ENCOUNTER — Encounter: Payer: Medicare Other | Admitting: Physical Therapy

## 2021-10-09 ENCOUNTER — Encounter: Payer: Medicare Other | Admitting: Obstetrics and Gynecology

## 2021-10-27 NOTE — Progress Notes (Signed)
PROVIDER NOTE: Information contained herein reflects review and annotations entered in association with encounter. Interpretation of such information and data should be left to medically-trained personnel. Information provided to patient can be located elsewhere in the medical record under "Patient Instructions". Document created using STT-dictation technology, any transcriptional errors that may result from process are unintentional.    Patient: Charlene [name]  Service Category: E/M  Provider: Gaspar Cola, MD  DOB: 02-14-1960  DOS: 10/30/2021  Referring Provider: Tracie Harrier, MD  MRN: [id number]  Specialty: Interventional Pain Management  PCP: Tracie Harrier, MD  Type: Established Patient  Setting: Ambulatory outpatient    Location: Office  Delivery: Face-to-face     HPI  [name], a 61 y.o. 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 Ms. Knobloch primary complain today is Back Pain (Mid right) Last encounter: My last encounter with her was on 05/28/2021. Pertinent problems: Ms. Streck has Burning sensation of feet; Neuropathic pain; Neuropathy; Lower extremity numbness and tingling (Left); Polyneuropathy associated with underlying disease ([place]); Chronic ankle pain (Left); Chronic foot pain (Left); Chronic lower extremity pain (2ry area of Pain) (Bilateral) (L>R); Chronic knee pain (4th area of Pain) (Bilateral) (L>R); Chronic low back pain (3ry area of Pain) (Bilateral) (L>R) w/ sciatica (Left); Chronic pain syndrome; DM type 2 with diabetic peripheral neuropathy (HCC); DDD (degenerative disc disease), lumbar; Lumbar facet arthropathy; Lumbar facet syndrome; Lumbar facet hypertrophy; Lumbar foraminal stenosis (L5-S1) (Right); Osteoarthritis of knee (Bilateral); Chronic musculoskeletal pain; Abnormal EMG (07/06/2017); Osteoarthritis involving multiple joints; 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[place]); Knee pain;  Back pain with left-sided sciatica; Meningioma ([place]); Neck pain; Chronic pain disorder; Left leg weakness; Meralgia paraesthetica, left; Pain in both lower extremities; Arthropathy of lumbar facet joint; Chronic ankle and foot pain (1ry area of Pain) (Left); 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 Pain Assessment: Severity of Chronic pain is reported as a 1 /10. Location: Back Right/radiates from right side around to right back. Onset: More than a month ago. Quality: Aching. Timing: Intermittent. Modifying factor(s): sitting, meds. Vitals:  height is $RemoveB'4\' 11"'ACRAYYYy$  (1.499 m) and weight is 158 lb (71.7 kg). Her temperature is 97.1 F (36.2 C) (abnormal). Her blood pressure is 167/84 (abnormal) and her pulse is 91. Her respiration is 18 and oxygen saturation is 100%.   Reason for encounter: evaluation of worsening, or previously known (established) problem.  The patient comes into the clinic today indicating that she still having pain in the lower back and she would like to have some injections done.  She still has the issue of the insulin-dependent diabetes and therefore I have asked her if this was under control.  She indicated that it is.  I reminded her that with the use of steroids her blood sugar would go up.  She stated that she is pending to see her primary care physician tomorrow and I have encouraged her that she discuss the possibility of having some steroids included in the injection.  The steroids are the only ones that have the potential to provide her with  some longer lasting benefit assuming that  we are dealing with an inflammatory process.  However, x-rays of her lumbar spine also demonstrate that she has spondylolisthesis at the L2-3 and L4-5 levels, both of which demonstrated movement between flexion and extension.  For this reason and the fact that the patient is experiencing intermittent episodes of electrical-like sensations, this would suggest the possibility of instability.  For this reason I am entering a referral to neurosurgery.  At this point she refers symptoms to be primarily in the lower back.  She states that she will like to also have a refill on her tramadol.  We are trying to keep her use of opioids to 1 minimum since the patient is high risk for possible substance use disorder.  Her 09/03/2020 UDS was positive for cocaine.  At that time we had completely stopped the medication, but due to further follow-up where the patient indicated that she had completely stopped all use of illicit substances, we decided to do a short trial.  Follow-up UDS on 03/11/2021 came back clean.  Follow-up PMP was also within normal limits.  Today we will go ahead and provide her with some tramadol to be used only when absolutely necessary for severe pain.  She understood and accepted.  Diagnostic x-rays of the lumbar spine (03/14/2021): IMPRESSION: 1. Mild superior endplate compression fracture of the L3 vertebral body, which appears recent. The pedicles and posterior elements are intact. 2. Grade 1 L4-5 facet hypertrophy-related anterolisthesis which widens from 6 mm in extension and neutral to 10.2 mm in flexion. 3. Grade 1 L2-3 retrolisthesis is seen, but only in extension. This was not noted previously. 4. Osteopenia, scoliosis and chronic degenerative change. 5. No SI joint ankylosis. Mild spurring and vacuum phenomenon both SI joints. 6. No findings of significant sacroiliitis.  On 04/16/2021 we talked about the possibility of radiofrequency ablation but  because the patient had not had physical therapy, referral was made at that time.  At the time of her follow-up on 05/28/2021 she indicated currently undergoing physical therapy which she described as possibly helping her lower back.  According to the patient's PMP her last tramadol prescription for 60 pills was filled on 05/28/2021.  In a similar manner her last pregabalin 25 mg prescription was also filled on 05/29/2021.  Because of the prior L3 fracture and history of osteopenia, today I will be ordering a bone density test to be followed by the patient's PCP.  I will also enter an order for a lumbar MRI and a referral to neurosurgery for possible fusion of what appears to be an unstable L4-5 anterolisthesis.  I will also order some x-rays of the thoracic spine to evaluate for possible thoracic spine fractures.  The patient's last available MRI was done in 2019 (ordered by Dr. Hessie Knows).  Pharmacotherapy Assessment  Analgesic: Tramadol 50 mg, 1 tab PO QD. ABNORMAL UDS (09/03/2020) (+) for COCAINE. MME: 5 mg/day   Monitoring: [place] PMP: PDMP reviewed during this encounter.       Pharmacotherapy: No side-effects or adverse reactions reported. Compliance: No problems identified. Effectiveness: Clinically acceptable.  Dewayne Shorter, RN  10/30/2021  3:23 PM  Sign when Signing Visit Nursing Pain Medication Assessment:  Safety precautions to be maintained throughout the outpatient stay will include: orient to surroundings, keep bed in low position, maintain call bell within reach at all times, provide assistance with transfer out of bed and ambulation.  Medication Inspection Compliance: Ms. Manso did not comply with our request to bring her pills to be  counted. She was reminded that bringing the medication bottles, even when empty, is a requirement.  Medication: None brought in. Pill/Patch Count: None available to be counted. Bottle Appearance: No container available. Did not bring bottle(s) to  appointment. Filled Date: N/A Last Medication intake:   2 months ago    No results found for: "CBDTHCR" No results found for: "D8THCCBX" No results found for: "D9THCCBX"  UDS:  Summary  Date Value Ref Range Status  03/11/2021 Note  Final    Comment:    ==================================================================== [employer] Select 13 (MW) ==================================================================== Test                             Result       Flag       Units    NO DRUGS DETECTED. ==================================================================== Test                      Result    Flag   Units      Ref Range   Creatinine              123              mg/dL      >=48 ==================================================================== Declared Medications:  The flagging and interpretation on this report are based on the  following declared medications.  Unexpected results may arise from  inaccuracies in the declared medications.   **Note: The testing scope of this panel does not include the  following reported medications:   Alendronate (Fosamax)  Amlodipine (Norvasc)  Baclofen (Lioresal)  Carvedilol (Coreg)  Dicyclomine (Bentyl)  Duloxetine (Cymbalta)  Hydralazine (Apresoline)  Insulin (Toujeo)  Lisinopril (Zestril)  Magnesium  Metformin (Glucophage)  Pantoprazole (Protonix)  Polyethylene Glycol  Pravastatin (Pravachol)  Pregabalin (Lyrica)  Sucralfate (Carafate)  Vitamin D3 ==================================================================== For clinical consultation, please call [phone]. ====================================================================       ROS  Constitutional: Denies any fever or chills Gastrointestinal: No reported hemesis, hematochezia, vomiting, or acute GI distress Musculoskeletal: Denies any acute onset joint swelling, redness, loss of ROM, or weakness Neurological: No reported episodes of acute onset  apraxia, aphasia, dysarthria, agnosia, amnesia, paralysis, loss of coordination, or loss of consciousness  Medication Review  Cholecalciferol, DULoxetine, FreeStyle Libre Sensor System, Magnesium, amLODipine, amoxicillin, baclofen, carvedilol, dicyclomine, hydrALAZINE, insulin glargine (2 Unit Dial), insulin lispro, lisinopril, metFORMIN, pantoprazole, polyethylene glycol, pravastatin, pregabalin, sucralfate, and traMADol  History Review  Allergy: Ms. Lowrimore is allergic to semaglutide. Drug: Ms. Bacigalupo  reports no history of drug use. Alcohol:  reports that she does not currently use alcohol. Tobacco:  reports that she has never smoked. She has never used smokeless tobacco. Social: Ms. Friesenhahn  reports that she has never smoked. She has never used smokeless tobacco. She reports that she does not currently use alcohol. She reports that she does not use drugs. Medical:  has a past medical history of Arthritis, Degenerative disc disease, lumbar, Diabetes mellitus without complication (HCC), GERD (gastroesophageal reflux disease), Hypertension, Neuropathy, Polyneuropathy, and Polyneuropathy. Surgical: Ms. Deberry  has a past surgical history that includes arthroscopic rotator cuff; Colonoscopy; Abdominal hysterectomy; endosopic sinus; and Cataract extraction w/PHACO (Right, 11/25/2017). Family: family history includes Alcohol abuse in her father; Breast cancer in her maternal aunt; Cancer in her father.  Laboratory Chemistry Profile   Renal Lab Results  Component Value Date   BUN 13 12/23/2018   CREATININE 0.59 12/23/2018   BCR 17 10/12/2017  GFRAA >60 12/23/2018   GFRNONAA >60 12/23/2018    Hepatic Lab Results  Component Value Date   AST 17 10/21/2018   ALT 21 10/21/2018   ALBUMIN 3.6 10/21/2018   ALKPHOS 81 10/21/2018   LIPASE 20 10/21/2018    Electrolytes Lab Results  Component Value Date   NA 137 12/23/2018   K 4.1 12/23/2018   CL 100 12/23/2018   CALCIUM 9.8 12/23/2018   MG  2.0 10/12/2017    Bone Lab Results  Component Value Date   25OHVITD1 17 (L) 10/12/2017   25OHVITD2 <1.0 10/12/2017   25OHVITD3 17 10/12/2017    Inflammation (CRP: Acute Phase) (ESR: Chronic Phase) Lab Results  Component Value Date   CRP 4 10/12/2017   ESRSEDRATE 12 10/12/2017         Note: Above Lab results reviewed.  Recent Imaging Review  DG PAIN CLINIC C-ARM 1-60 MIN NO REPORT Fluoro was used, but no Radiologist interpretation will be provided.  Please refer to "NOTES" tab for provider progress note. Note: Reviewed        Physical Exam  General appearance: Well nourished, well developed, and well hydrated. In no apparent acute distress Mental status: Alert, oriented x 3 (person, place, & time)       Respiratory: No evidence of acute respiratory distress Eyes: PERLA Vitals: BP (!) 167/84   Pulse 91   Temp (!) 97.1 F (36.2 C)   Resp 18   Ht $R'4\' 11"'Zt$  (1.499 m)   Wt 158 lb (71.7 kg)   SpO2 100%   BMI 31.91 kg/m  BMI: Estimated body mass index is 31.91 kg/m as calculated from the following:   Height as of this encounter: $RemoveBeforeD'4\' 11"'gyVwtCdtlNRNnG$  (1.499 m).   Weight as of this encounter: 158 lb (71.7 kg). Ideal: Patient must be at least 60 in tall to calculate ideal body weight  Assessment   Diagnosis Status  1. 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 Chronic low back pain (Bilateral) w/o sciatica   3. Chronic lower extremity pain (2ry area of Pain) (Bilateral) (L>R)   4. Abnormal x-ray of lumbar spine (03/16/2021)   5. Chronic ankle and foot pain (1ry area of Pain) (Left)   6. Chronic low back pain (3ry area of Pain) (Bilateral) (L>R) w/ sciatica (Left)   7. Chronic knee pain (4th area of Pain) (Bilateral) (L>R)   8. 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 Lumbar facet syndrome   10. Chronic pain syndrome   11. Osteopenia determined by x-ray   12. Closed compression fracture of L3 lumbar vertebra, sequela   13. History of compression fracture of  vertebral column   14. Spinal column pain    Controlled Controlled Controlled   Updated Problems: No problems updated.    Plan of Care  Problem-specific:  No problem-specific Assessment & Plan notes found for this encounter.  Ms. CRIMSON DUBBERLY has a current medication list which includes the following long-term medication(s): amlodipine, baclofen, carvedilol, cholecalciferol, dicyclomine, duloxetine, hydralazine, toujeo max solostar, insulin lispro, lisinopril, magnesium, pantoprazole, pravastatin, pregabalin, sucralfate, and tramadol.  Pharmacotherapy (Medications Ordered): Meds ordered this encounter  Medications   traMADol (ULTRAM) 50 MG tablet    Sig: Take 1 tablet (50 mg total) by mouth daily. Must last 30 days    Dispense:  30 tablet    Refill:  5    Chronic Pain: STOP Act (Not applicable) Fill 1 day early if closed on refill date. Avoid benzodiazepines  within 8 hours of opioids   Orders:  Orders Placed This Encounter  Procedures   LUMBAR FACET(MEDIAL BRANCH NERVE BLOCK) MBNB    Standing Status:   Future    Standing Expiration Date:   01/30/2022    Scheduling Instructions:     Procedure: Lumbar facet block (AKA.: Lumbosacral medial branch nerve block)     Side: Bilateral     Level: L3-4 & L5-S1 Facets (L2, L3, L4, L5, & S1 Medial Branch Nerves)     Sedation: Patient's choice.     Timeframe: ASAA    Order Specific Question:   Where will this procedure be performed?    Answer:   [facility]   MR LUMBAR SPINE WO CONTRAST    Patient presents with axial pain with possible radicular component. Please assist [place] in identifying specific level(s) and laterality of any additional findings such as: 1. Facet (Zygapophyseal) joint DJD (Hypertrophy, space narrowing, subchondral sclerosis, and/or osteophyte formation) 2. DDD and/or IVDD (Loss of disc height, desiccation, gas patterns, osteophytes, endplate sclerosis, or "Black disc disease") 3. Pars defects 4.  Spondylolisthesis, spondylosis, and/or spondyloarthropathies (include Degree/Grade of displacement in mm) (stability) 5. Vertebral body Fractures (acute/chronic) (state percentage of collapse) 6. Demineralization (osteopenia/osteoporotic) 7. Bone pathology 8. Foraminal narrowing  9. Surgical changes 10. Central, Lateral Recess, and/or Foraminal Stenosis (include AP diameter of stenosis in mm) 11. Surgical changes (hardware type, status, and presence of fibrosis) 12. Modic Type Changes (MRI only) 13. IVDD (Disc bulge, protrusion, herniation, extrusion) (Level, laterality, extent)    Standing Status:   Future    Standing Expiration Date:   11/30/2021    Scheduling Instructions:     Imaging must be done as soon as possible. Inform patient that order will expire within 30 days and I will not renew it.    Order Specific Question:   What is the patient's sedation requirement?    Answer:   No Sedation    Order Specific Question:   Does the patient have a pacemaker or implanted devices?    Answer:   No    Order Specific Question:   Preferred imaging location?    Answer:   [facility] (table limit-350lbs)    Order Specific Question:   Call Results- Best Contact Number?    Answer:   [phone] ([facility])    Order Specific Question:   Radiology Contrast Protocol - do NOT remove file path    Answer:   [email or web address]   DG BONE DENSITY (DXA)    Standing Status:   Future    Standing Expiration Date:   10/31/2022    Order Specific Question:   Reason for Exam (SYMPTOM  OR DIAGNOSIS REQUIRED)    Answer:   Osteopenia with vertebral body fractures    Order Specific Question:   Is the patient pregnant?    Answer:   No    Order Specific Question:   Preferred imaging location?    Answer:   [facility]   DG Thoracic Spine 4V    Patient presents with axial pain with possible radicular component. Please assist [place] in identifying specific level(s) and  laterality of any additional findings such as: 1. Facet (Zygapophyseal) joint DJD (Hypertrophy, space narrowing, subchondral sclerosis, and/or osteophyte formation) 2. DDD and/or IVDD (Loss of disc height, desiccation, gas patterns, osteophytes, endplate sclerosis, or "Black disc disease") 3. Pars defects 4. Spondylolisthesis, spondylosis, and/or spondyloarthropathies (include Degree/Grade of displacement in mm) (stability) 5. Vertebral body Fractures (acute/chronic) (state percentage  of collapse) 6. Demineralization (osteopenia/osteoporotic) 7. Bone pathology 8. Foraminal narrowing  9. Surgical changes    Standing Status:   Future    Standing Expiration Date:   01/30/2022    Order Specific Question:   Reason for Exam (SYMPTOM  OR DIAGNOSIS REQUIRED)    Answer:   Upper back pain and/or thoracic spine pain.    Order Specific Question:   Is patient pregnant?    Answer:   No    Order Specific Question:   Preferred imaging location?    Answer:   [facility]    Order Specific Question:   Call Results- Best Contact Number?    Answer:   [phone] ([facility])   Ambulatory referral to Neurosurgery    Referral Priority:   Routine    Referral Type:   Surgical    Referral Reason:   Specialty Services Required    Referred to Provider:   [place]    Requested Specialty:   Neurosurgery    Number of Visits Requested:   1   Follow-up plan:   Return for procedure (ECT): (B) L-FCT Blk #R4L2 (NO STEROIDS).     Interventional Therapies  Risk  Complexity Considerations:   Estimated body mass index is 31.91 kg/m as calculated from the following:   Height as of 12/27/20: $RemoveBef'4\' 11"'tyCuqQQWhw$  (1.499 m).   Weight as of 12/27/20: 158 lb (71.7 kg).   NOTE: UNCONTROLLED IDDM (NO STEROIDS)  ABNORMAL UDS (09/03/2020) (+) for COCAINE   Planned  Pending:      Under consideration:   Possible bilateral lumbar facet RFA  Diagnostic bilateral genicular NB     Completed:   Diagnostic right lumbar facet MBB x3 (no steroid) (03/26/2021) (100/100/50/50)  Diagnostic left lumbar facet MBB x1 (w/ steroid) (10/09/2020) (100/100/90/L-100; R-50)  Therapeutic left L5 TFESI x4 (09/21/2018) (100/100/50/70)  Therapeutic left L4-5 LESI x4 (09/21/2018) (100/100/50/70)  Therapeutic left L5-S1 LESI x2 (02/25/2018) (100/100/100/100)  Therapeutic bilateral Hyalgan knee inj. x5 (02/25/2018) (100/100/100 x2 weeks/<50)  Therapeutic left IA steroid knee injection x1 (11/23/2017) (100/100/0/0)    Therapeutic  Palliative (PRN) options:   Palliative L5 TFESI  Palliative L4-5 LESI   Palliative L5-S1 LESI   Palliative Hyalgan knee inj.    Recent Visits Date Type Provider Dept  10/30/21 Office Visit Milinda Pointer, MD [facility]  Showing recent visits within past 90 days and meeting all other requirements Future Appointments No visits were found meeting these conditions. Showing future appointments within next 90 days and meeting all other requirements  I discussed the assessment and treatment plan with the patient. The patient was provided an opportunity to ask questions and all were answered. The patient agreed with the plan and demonstrated an understanding of the instructions.  Patient advised to call back or seek an in-person evaluation if the symptoms or condition worsens.  Duration of encounter: 43 minutes.  Total time on encounter, as per AMA guidelines included both the face-to-face and non-face-to-face time personally spent by the physician and/or other qualified health care professional(s) on the day of the encounter (includes time in activities that require the physician or other qualified health care professional and does not include time in activities normally performed by clinical staff). Physician's time may include the following activities when performed: preparing to see the patient (eg, review of tests, pre-charting review of  records) obtaining and/or reviewing separately obtained history performing a medically appropriate examination and/or evaluation counseling and educating the patient/family/caregiver ordering  medications, tests, or procedures referring and communicating with other health care professionals (when not separately reported) documenting clinical information in the electronic or other health record independently interpreting results (not separately reported) and communicating results to the patient/ family/caregiver care coordination (not separately reported)  Note by: Gaspar Cola, MD Date: 10/30/2021; Time: 8:57 AM

## 2021-10-30 ENCOUNTER — Ambulatory Visit: Payer: Medicare Other | Attending: Pain Medicine | Admitting: Pain Medicine

## 2021-10-30 ENCOUNTER — Encounter: Payer: Self-pay | Admitting: Pain Medicine

## 2021-10-30 VITALS — BP 167/84 | HR 91 | Temp 97.1°F | Resp 18 | Ht 59.0 in | Wt 158.0 lb

## 2021-10-30 DIAGNOSIS — R937 Abnormal findings on diagnostic imaging of other parts of musculoskeletal system: Secondary | ICD-10-CM | POA: Diagnosis not present

## 2021-10-30 DIAGNOSIS — E1142 Type 2 diabetes mellitus with diabetic polyneuropathy: Secondary | ICD-10-CM | POA: Diagnosis present

## 2021-10-30 DIAGNOSIS — G894 Chronic pain syndrome: Secondary | ICD-10-CM | POA: Diagnosis present

## 2021-10-30 DIAGNOSIS — Z8781 Personal history of (healed) traumatic fracture: Secondary | ICD-10-CM | POA: Diagnosis present

## 2021-10-30 DIAGNOSIS — M5442 Lumbago with sciatica, left side: Secondary | ICD-10-CM | POA: Insufficient documentation

## 2021-10-30 DIAGNOSIS — S32030S Wedge compression fracture of third lumbar vertebra, sequela: Secondary | ICD-10-CM

## 2021-10-30 DIAGNOSIS — M549 Dorsalgia, unspecified: Secondary | ICD-10-CM | POA: Diagnosis present

## 2021-10-30 DIAGNOSIS — M79605 Pain in left leg: Secondary | ICD-10-CM | POA: Insufficient documentation

## 2021-10-30 DIAGNOSIS — M545 Low back pain, unspecified: Secondary | ICD-10-CM | POA: Insufficient documentation

## 2021-10-30 DIAGNOSIS — M25561 Pain in right knee: Secondary | ICD-10-CM | POA: Insufficient documentation

## 2021-10-30 DIAGNOSIS — M25562 Pain in left knee: Secondary | ICD-10-CM | POA: Insufficient documentation

## 2021-10-30 DIAGNOSIS — M25572 Pain in left ankle and joints of left foot: Secondary | ICD-10-CM

## 2021-10-30 DIAGNOSIS — G8929 Other chronic pain: Secondary | ICD-10-CM

## 2021-10-30 DIAGNOSIS — M4316 Spondylolisthesis, lumbar region: Secondary | ICD-10-CM | POA: Diagnosis present

## 2021-10-30 DIAGNOSIS — M79604 Pain in right leg: Secondary | ICD-10-CM | POA: Insufficient documentation

## 2021-10-30 DIAGNOSIS — M8580 Other specified disorders of bone density and structure, unspecified site: Secondary | ICD-10-CM | POA: Diagnosis present

## 2021-10-30 DIAGNOSIS — M47816 Spondylosis without myelopathy or radiculopathy, lumbar region: Secondary | ICD-10-CM

## 2021-10-30 MED ORDER — TRAMADOL HCL 50 MG PO TABS: 50.0000 mg | ORAL_TABLET | Freq: Every day | ORAL | 5 refills | Status: DC

## 2021-10-30 NOTE — Progress Notes (Signed)
Nursing Pain Medication Assessment:  Safety precautions to be maintained throughout the outpatient stay will include: orient to surroundings, keep bed in low position, maintain call bell within reach at all times, provide assistance with transfer out of bed and ambulation.  Medication Inspection Compliance: Charlene [name] did not comply with our request to bring her pills to be counted. She was reminded that bringing the medication bottles, even when empty, is a requirement.  Medication: None brought in. Pill/Patch Count: None available to be counted. Bottle Appearance: No container available. Did not bring bottle(s) to appointment. Filled Date: N/A Last Medication intake:   2 months ago

## 2021-10-30 NOTE — Patient Instructions (Addendum)
______________________________________________________________________  Preparing for Procedure with Sedation  NOTICE: Due to recent regulatory changes, starting on August 20, 2020, procedures requiring intravenous (IV) sedation will no longer be performed at the [facility].  These types of procedures are required to be performed at [facility] ambulatory surgery facility.  We are very sorry for the inconvenience.  Procedure appointments are limited to planned procedures: No Prescription Refills. No disability issues will be discussed. No medication changes will be discussed.  Instructions: Oral Intake: Do not eat or drink anything for at least 8 hours prior to your procedure. (Exception: Blood Pressure Medication. See below.) Transportation: A driver is required. You may not drive yourself after the procedure. Blood Pressure Medicine: Do not forget to take your blood pressure medicine with a sip of water the morning of the procedure. If your Diastolic (lower reading) is above 100 mmHg, elective cases will be cancelled/rescheduled. Blood thinners: These will need to be stopped for procedures. Notify our staff if you are taking any blood thinners. Depending on which one you take, there will be specific instructions on how and when to stop it. Diabetics on insulin: Notify the staff so that you can be scheduled 1st case in the morning. If your diabetes requires high dose insulin, take only  of your normal insulin dose the morning of the procedure and notify the staff that you have done so. Preventing infections: Shower with an antibacterial soap the morning of your procedure. Build-up your immune system: Take 1000 mg of Vitamin C with every meal (3 times a day) the day prior to your procedure. Antibiotics: Inform the staff if you have a condition or reason that requires you to take antibiotics before dental procedures. Pregnancy: If you are pregnant, call and cancel the procedure. Sickness: If  you have a cold, fever, or any active infections, call and cancel the procedure. Arrival: You must be in the facility at least 30 minutes prior to your scheduled procedure. Children: Do not bring children with you. Dress appropriately: There is always the possibility that your clothing may get soiled. Valuables: Do not bring any jewelry or valuables.  Reasons to call and reschedule or cancel your procedure: (Following these recommendations will minimize the risk of a serious complication.) Surgeries: Avoid having procedures within 2 weeks of any surgery. (Avoid for 2 weeks before or after any surgery). Flu Shots: Avoid having procedures within 2 weeks of a flu shots. (Avoid for 2 weeks before or after immunizations). Barium: Avoid having a procedure within 7-10 days after having had a radiological study involving the use of radiological contrast. (Myelograms, Barium swallow or enema study). Heart attacks: Avoid any elective procedures or surgeries for the initial 6 months after a "Myocardial Infarction" (Heart Attack). Blood thinners: It is imperative that you stop these medications before procedures. Let us know if you if you take any blood thinner.  Infection: Avoid procedures during or within two weeks of an infection (including chest colds or gastrointestinal problems). Symptoms associated with infections include: Localized redness, fever, chills, night sweats or profuse sweating, burning sensation when voiding, cough, congestion, stuffiness, runny nose, sore throat, diarrhea, nausea, vomiting, cold or Flu symptoms, recent or current infections. It is specially important if the infection is over the area that we intend to treat. Heart and lung problems: Symptoms that may suggest an active cardiopulmonary problem include: cough, chest pain, breathing difficulties or shortness of breath, dizziness, ankle swelling, uncontrolled high or unusually low blood pressure, and/or palpitations. If you are    experiencing any of these symptoms, cancel your procedure and contact your primary care physician for an evaluation.  Remember:  Regular Business hours are:  Monday to Thursday 8:00 AM to 4:00 PM  Provider's Schedule: Francisco Naveira, MD:  Procedure days: Tuesday and Thursday 7:30 AM to 4:00 PM  Bilal Lateef, MD:  Procedure days: Monday and Wednesday 7:30 AM to 4:00 PM ______________________________________________________________________  ____________________________________________________________________________________________  General Risks and Possible Complications  Patient Responsibilities: It is important that you read this as it is part of your informed consent. It is our duty to inform you of the risks and possible complications associated with treatments offered to you. It is your responsibility as a patient to read this and to ask questions about anything that is not clear or that you believe was not covered in this document.  Patient's Rights: You have the right to refuse treatment. You also have the right to change your mind, even after initially having agreed to have the treatment done. However, under this last option, if you wait until the last second to change your mind, you may be charged for the materials used up to that point.  Introduction: Medicine is not an exact science. Everything in Medicine, including the lack of treatment(s), carries the potential for danger, harm, or loss (which is by definition: Risk). In Medicine, a complication is a secondary problem, condition, or disease that can aggravate an already existing one. All treatments carry the risk of possible complications. The fact that a side effects or complications occurs, does not imply that the treatment was conducted incorrectly. It must be clearly understood that these can happen even when everything is done following the highest safety standards.  No treatment: You can choose not to proceed with the  proposed treatment alternative. The "PRO(s)" would include: avoiding the risk of complications associated with the therapy. The "CON(s)" would include: not getting any of the treatment benefits. These benefits fall under one of three categories: diagnostic; therapeutic; and/or palliative. Diagnostic benefits include: getting information which can ultimately lead to improvement of the disease or symptom(s). Therapeutic benefits are those associated with the successful treatment of the disease. Finally, palliative benefits are those related to the decrease of the primary symptoms, without necessarily curing the condition (example: decreasing the pain from a flare-up of a chronic condition, such as incurable terminal cancer).  General Risks and Complications: These are associated to most interventional treatments. They can occur alone, or in combination. They fall under one of the following six (6) categories: no benefit or worsening of symptoms; bleeding; infection; nerve damage; allergic reactions; and/or death. No benefits or worsening of symptoms: In Medicine there are no guarantees, only probabilities. No healthcare provider can ever guarantee that a medical treatment will work, they can only state the probability that it may. Furthermore, there is always the possibility that the condition may worsen, either directly, or indirectly, as a consequence of the treatment. Bleeding: This is more common if the patient is taking a blood thinner, either prescription or over the counter (example: Goody Powders, Fish oil, Aspirin, Garlic, etc.), or if suffering a condition associated with impaired coagulation (example: Hemophilia, cirrhosis of the liver, low platelet counts, etc.). However, even if you do not have one on these, it can still happen. If you have any of these conditions, or take one of these drugs, make sure to notify your treating physician. Infection: This is more common in patients with a compromised  immune system, either due to disease (example:   diabetes, cancer, human immunodeficiency virus [HIV], etc.), or due to medications or treatments (example: therapies used to treat cancer and rheumatological diseases). However, even if you do not have one on these, it can still happen. If you have any of these conditions, or take one of these drugs, make sure to notify your treating physician. Nerve Damage: This is more common when the treatment is an invasive one, but it can also happen with the use of medications, such as those used in the treatment of cancer. The damage can occur to small secondary nerves, or to large primary ones, such as those in the spinal cord and brain. This damage may be temporary or permanent and it may lead to impairments that can range from temporary numbness to permanent paralysis and/or brain death. Allergic Reactions: Any time a substance or material comes in contact with our body, there is the possibility of an allergic reaction. These can range from a mild skin rash (contact dermatitis) to a severe systemic reaction (anaphylactic reaction), which can result in death. Death: In general, any medical intervention can result in death, most of the time due to an unforeseen complication. ____________________________________________________________________________________________ Facet Blocks Patient Information  Description: The facets are joints in the spine between the vertebrae.  Like any joints in the body, facets can become irritated and painful.  Arthritis can also effect the facets.  By injecting steroids and local anesthetic in and around these joints, we can temporarily block the nerve supply to them.  Steroids act directly on irritated nerves and tissues to reduce selling and inflammation which often leads to decreased pain.  Facet blocks may be done anywhere along the spine from the neck to the low back depending upon the location of your pain.   After numbing the skin  with local anesthetic (like Novocaine), a small needle is passed onto the facet joints under x-ray guidance.  You may experience a sensation of pressure while this is being done.  The entire block usually lasts about 15-25 minutes.   Conditions which may be treated by facet blocks:  Low back/buttock pain Neck/shoulder pain Certain types of headaches  Preparation for the injection:  Do not eat any solid food or dairy products within 8 hours of your appointment. You may drink clear liquid up to 3 hours before appointment.  Clear liquids include water, black coffee, juice or soda.  No milk or cream please. You may take your regular medication, including pain medications, with a sip of water before your appointment.  Diabetics should hold regular insulin (if taken separately) and take 1/2 normal NPH dose the morning of the procedure.  Carry some sugar containing items with you to your appointment. A driver must accompany you and be prepared to drive you home after your procedure. Bring all your current medications with you. An IV may be inserted and sedation may be given at the discretion of the physician. A blood pressure cuff, EKG and other monitors will often be applied during the procedure.  Some patients may need to have extra oxygen administered for a short period. You will be asked to provide medical information, including your allergies and medications, prior to the procedure.  We must know immediately if you are taking blood thinners (like Coumadin/Warfarin) or if you are allergic to IV iodine contrast (dye).  We must know if you could possible be pregnant.  Possible side-effects:  Bleeding from needle site Infection (rare, may require surgery) Nerve injury (rare) Numbness & tingling (temporary) Difficulty  urinating (rare, temporary) Spinal headache (a headache worse with upright posture) Light-headedness (temporary) Pain at injection site (serveral days) Decreased blood pressure  (rare, temporary) Weakness in arm/leg (temporary) Pressure sensation in back/neck (temporary)   Call if you experience:  Fever/chills associated with headache or increased back/neck pain Headache worsened by an upright position New onset, weakness or numbness of an extremity below the injection site Hives or difficulty breathing (go to the emergency room) Inflammation or drainage at the injection site(s) Severe back/neck pain greater than usual New symptoms which are concerning to you  Please note:  Although the local anesthetic injected can often make your back or neck feel good for several hours after the injection, the pain will likely return. It takes 3-7 days for steroids to work.  You may not notice any pain relief for at least one week.  If effective, we will often do a series of 2-3 injections spaced 3-6 weeks apart to maximally decrease your pain.  After the initial series, you may be a candidate for a more permanent nerve block of the facets.  If you have any questions, please call #[phone]) [facility]

## 2021-11-05 ENCOUNTER — Ambulatory Visit
Admission: RE | Admit: 2021-11-05 | Discharge: 2021-11-05 | Disposition: A | Discharge status: Home / Self Care | Payer: Medicare Other | Source: Home / Self Care | Attending: Pain Medicine | Admitting: Pain Medicine

## 2021-11-05 ENCOUNTER — Ambulatory Visit
Admission: RE | Admit: 2021-11-05 | Discharge: 2021-11-05 | Disposition: A | Discharge status: Home / Self Care | Payer: Medicare Other | Source: Ambulatory Visit | Attending: Pain Medicine | Admitting: Pain Medicine

## 2021-11-05 ENCOUNTER — Other Ambulatory Visit: Payer: Self-pay | Admitting: Pain Medicine

## 2021-11-05 DIAGNOSIS — M545 Low back pain, unspecified: Secondary | ICD-10-CM

## 2021-11-05 DIAGNOSIS — M8580 Other specified disorders of bone density and structure, unspecified site: Secondary | ICD-10-CM

## 2021-11-05 DIAGNOSIS — M5442 Lumbago with sciatica, left side: Secondary | ICD-10-CM | POA: Diagnosis present

## 2021-11-05 DIAGNOSIS — M47816 Spondylosis without myelopathy or radiculopathy, lumbar region: Secondary | ICD-10-CM

## 2021-11-05 DIAGNOSIS — M549 Dorsalgia, unspecified: Secondary | ICD-10-CM | POA: Insufficient documentation

## 2021-11-05 DIAGNOSIS — M25562 Pain in left knee: Secondary | ICD-10-CM | POA: Diagnosis present

## 2021-11-05 DIAGNOSIS — S32030S Wedge compression fracture of third lumbar vertebra, sequela: Secondary | ICD-10-CM

## 2021-11-05 DIAGNOSIS — Z8781 Personal history of (healed) traumatic fracture: Secondary | ICD-10-CM

## 2021-11-05 DIAGNOSIS — G8929 Other chronic pain: Secondary | ICD-10-CM

## 2021-11-05 DIAGNOSIS — M25572 Pain in left ankle and joints of left foot: Secondary | ICD-10-CM | POA: Diagnosis present

## 2021-11-05 DIAGNOSIS — M79605 Pain in left leg: Secondary | ICD-10-CM | POA: Insufficient documentation

## 2021-11-05 DIAGNOSIS — M25561 Pain in right knee: Secondary | ICD-10-CM | POA: Insufficient documentation

## 2021-11-05 DIAGNOSIS — R937 Abnormal findings on diagnostic imaging of other parts of musculoskeletal system: Secondary | ICD-10-CM | POA: Diagnosis present

## 2021-11-05 DIAGNOSIS — M79604 Pain in right leg: Secondary | ICD-10-CM | POA: Insufficient documentation

## 2021-11-05 DIAGNOSIS — M4316 Spondylolisthesis, lumbar region: Secondary | ICD-10-CM

## 2021-11-11 NOTE — Progress Notes (Signed)
PROVIDER NOTE: Interpretation of information contained herein should be left to medically-trained personnel. Specific patient instructions are provided elsewhere under "Patient Instructions" section of medical record. This document was created in part using STT-dictation technology, any transcriptional errors that may result from this process are unintentional.  Patient: Charlene [name] Type: Established DOB: 05-19-1960 MRN: [id number] PCP: Tracie Harrier, MD  Service: Procedure DOS: 11/12/2021 Setting: Ambulatory Location: Ambulatory outpatient facility Delivery: Face-to-face Provider: Gaspar Cola, MD Specialty: Interventional Pain Management Specialty designation: 09 Location: Outpatient facility Ref. Prov.: Milinda Pointer, MD    Procedure:           Type: Lumbar Facet, Medial Branch Block(s)  R4  L2 (NO STEROIDS) Laterality: Bilateral  Level: L2, L3, L4, L5, & S1 Medial Branch Level(s). Injecting these levels blocks the L3-4, L4-5 and L5-S1 lumbar facet joints. Imaging: Fluoroscopic guidance         Anesthesia: Local anesthesia (1-2% Lidocaine) Anxiolysis: IV Versed 2.0 mg Sedation: Moderate Sedation None required. No Fentanyl administered.         DOS: 11/12/2021 Performed by: Gaspar Cola, MD  Primary Purpose: Diagnostic/Therapeutic Indications: Low back pain severe enough to impact quality of life or function. 1. Lumbar facet syndrome   2. Lumbar facet hypertrophy   3. Lumbar facet arthropathy   4. Spondylosis without myelopathy or radiculopathy, lumbosacral region   5. Chronic low back pain (Bilateral) w/o sciatica   6. DDD (degenerative disc disease), lumbar    NAS-11 Pain score:   Pre-procedure: 1 /10   Post-procedure: 0-No pain/10     Position / Prep / Materials:  Position: Prone  Prep solution: DuraPrep (Iodine Povacrylex [0.7% available iodine] and Isopropyl Alcohol, 74% w/w) Area Prepped: Posterolateral Lumbosacral Spine (Wide prep: From  the lower border of the scapula down to the end of the tailbone and from flank to flank.)  Materials:  Tray: Block Needle(s):  Type: Spinal  Gauge (G): 22  Length: 3.5-in Qty: 4     Pre-op H&P Assessment:  Charlene [name] is a 60 y.o. (year old), female patient, seen today for interventional treatment. She  has a past surgical history that includes arthroscopic rotator cuff; Colonoscopy; Abdominal hysterectomy; endosopic sinus; and Cataract extraction w/PHACO (Right, 11/25/2017). Charlene [name] has a current medication list which includes the following prescription(s): amlodipine, amoxicillin, baclofen, carvedilol, cholecalciferol, freestyle libre sensor system, dicyclomine, duloxetine, hydralazine, toujeo max solostar, insulin lispro, lisinopril, magnesium, metformin, pantoprazole, polyethylene glycol, pravastatin, pregabalin, sucralfate, and tramadol, and the following Facility-Administered Medications: fentanyl. Her primarily concern today is the Back Pain (Lumbar )  Initial Vital Signs:  Pulse/HCG Rate: 71ECG Heart Rate: 80 (NSR) Temp: (!) 97.2 F (36.2 C) Resp: 16 BP: (!) 160/77 SpO2: 100 %  BMI: Estimated body mass index is 29.29 kg/m as calculated from the following:   Height as of this encounter: '4\' 11"'$  (1.499 m).   Weight as of this encounter: 145 lb (65.8 kg).  Risk Assessment: Allergies: Reviewed. She is allergic to semaglutide.  Allergy Precautions: None required Coagulopathies: Reviewed. None identified.  Blood-thinner therapy: None at this time Active Infection(s): Reviewed. None identified. Charlene [name] is afebrile  Site Confirmation: Charlene [name] was asked to confirm the procedure and laterality before marking the site Procedure checklist: Completed Consent: Before the procedure and under the influence of no sedative(s), amnesic(s), or anxiolytics, the patient was informed of the treatment options, risks and possible complications. To fulfill our ethical and legal obligations, as  recommended by the American Medical Association's Code of  Ethics, I have informed the patient of my clinical impression; the nature and purpose of the treatment or procedure; the risks, benefits, and possible complications of the intervention; the alternatives, including doing nothing; the risk(s) and benefit(s) of the alternative treatment(s) or procedure(s); and the risk(s) and benefit(s) of doing nothing. The patient was provided information about the general risks and possible complications associated with the procedure. These may include, but are not limited to: failure to achieve desired goals, infection, bleeding, organ or nerve damage, allergic reactions, paralysis, and death. In addition, the patient was informed of those risks and complications associated to Spine-related procedures, such as failure to decrease pain; infection (i.e.: Meningitis, epidural or intraspinal abscess); bleeding (i.e.: epidural hematoma, subarachnoid hemorrhage, or any other type of intraspinal or peri-dural bleeding); organ or nerve damage (i.e.: Any type of peripheral nerve, nerve root, or spinal cord injury) with subsequent damage to sensory, motor, and/or autonomic systems, resulting in permanent pain, numbness, and/or weakness of one or several areas of the body; allergic reactions; (i.e.: anaphylactic reaction); and/or death. Furthermore, the patient was informed of those risks and complications associated with the medications. These include, but are not limited to: allergic reactions (i.e.: anaphylactic or anaphylactoid reaction(s)); adrenal axis suppression; blood sugar elevation that in diabetics may result in ketoacidosis or comma; water retention that in patients with history of congestive heart failure may result in shortness of breath, pulmonary edema, and decompensation with resultant heart failure; weight gain; swelling or edema; medication-induced neural toxicity; particulate matter embolism and blood vessel  occlusion with resultant organ, and/or nervous system infarction; and/or aseptic necrosis of one or more joints. Finally, the patient was informed that Medicine is not an exact science; therefore, there is also the possibility of unforeseen or unpredictable risks and/or possible complications that may result in a catastrophic outcome. The patient indicated having understood very clearly. We have given the patient no guarantees and we have made no promises. Enough time was given to the patient to ask questions, all of which were answered to the patient's satisfaction. Ms. Devonshire has indicated that she wanted to continue with the procedure. Attestation: I, the ordering provider, attest that I have discussed with the patient the benefits, risks, side-effects, alternatives, likelihood of achieving goals, and potential problems during recovery for the procedure that I have provided informed consent. Date  Time: 11/12/2021  8:55 AM  Pre-Procedure Preparation:  Monitoring: As per clinic protocol. Respiration, ETCO2, SpO2, BP, heart rate and rhythm monitor placed and checked for adequate function Safety Precautions: Patient was assessed for positional comfort and pressure points before starting the procedure. Time-out: I initiated and conducted the "Time-out" before starting the procedure, as per protocol. The patient was asked to participate by confirming the accuracy of the "Time Out" information. Verification of the correct person, site, and procedure were performed and confirmed by me, the nursing staff, and the patient. "Time-out" conducted as per Joint Commission's Universal Protocol (UP.01.01.01). Time: 0931  Description of Procedure:          Laterality: Bilateral. The procedure was performed in identical fashion on both sides. Targeted Levels:  L2, L3, L4, L5, & S1 Medial Branch Level(s)  Safety Precautions: Aspiration looking for blood return was conducted prior to all injections. At no point did  we inject any substances, as a needle was being advanced. Before injecting, the patient was told to immediately notify me if she was experiencing any new onset of "ringing in the ears, or metallic taste in the mouth".  No attempts were made at seeking any paresthesias. Safe injection practices and needle disposal techniques used. Medications properly checked for expiration dates. SDV (single dose vial) medications used. After the completion of the procedure, all disposable equipment used was discarded in the proper designated medical waste containers. Local Anesthesia: Protocol guidelines were followed. The patient was positioned over the fluoroscopy table. The area was prepped in the usual manner. The time-out was completed. The target area was identified using fluoroscopy. A 12-in long, straight, sterile hemostat was used with fluoroscopic guidance to locate the targets for each level blocked. Once located, the skin was marked with an approved surgical skin marker. Once all sites were marked, the skin (epidermis, dermis, and hypodermis), as well as deeper tissues (fat, connective tissue and muscle) were infiltrated with a small amount of a short-acting local anesthetic, loaded on a 10cc syringe with a 25G, 1.5-in  Needle. An appropriate amount of time was allowed for local anesthetics to take effect before proceeding to the next step. Local Anesthetic: Lidocaine 2.0% The unused portion of the local anesthetic was discarded in the proper designated containers. Technical description of process:  L2 Medial Branch Nerve Block (MBB): The target area for the L2 medial branch is at the junction of the postero-lateral aspect of the superior articular process and the superior, posterior, and medial edge of the transverse process of L3. Under fluoroscopic guidance, a Quincke needle was inserted until contact was made with os over the superior postero-lateral aspect of the pedicular shadow (target area). After negative  aspiration for blood, 0.5 mL of the nerve block solution was injected without difficulty or complication. The needle was removed intact. L3 Medial Branch Nerve Block (MBB): The target area for the L3 medial branch is at the junction of the postero-lateral aspect of the superior articular process and the superior, posterior, and medial edge of the transverse process of L4. Under fluoroscopic guidance, a Quincke needle was inserted until contact was made with os over the superior postero-lateral aspect of the pedicular shadow (target area). After negative aspiration for blood, 0.5 mL of the nerve block solution was injected without difficulty or complication. The needle was removed intact. L4 Medial Branch Nerve Block (MBB): The target area for the L4 medial branch is at the junction of the postero-lateral aspect of the superior articular process and the superior, posterior, and medial edge of the transverse process of L5. Under fluoroscopic guidance, a Quincke needle was inserted until contact was made with os over the superior postero-lateral aspect of the pedicular shadow (target area). After negative aspiration for blood, 0.5 mL of the nerve block solution was injected without difficulty or complication. The needle was removed intact. L5 Medial Branch Nerve Block (MBB): The target area for the L5 medial branch is at the junction of the postero-lateral aspect of the superior articular process and the superior, posterior, and medial edge of the sacral ala. Under fluoroscopic guidance, a Quincke needle was inserted until contact was made with os over the superior postero-lateral aspect of the pedicular shadow (target area). After negative aspiration for blood, 0.5 mL of the nerve block solution was injected without difficulty or complication. The needle was removed intact. S1 Medial Branch Nerve Block (MBB): The target area for the S1 medial branch is at the posterior and inferior 6 o'clock position of the L5-S1  facet joint. Under fluoroscopic guidance, the Quincke needle inserted for the L5 MBB was redirected until contact was made with os over the inferior and postero  aspect of the sacrum, at the 6 o' clock position under the L5-S1 facet joint (Target area). After negative aspiration for blood, 0.5 mL of the nerve block solution was injected without difficulty or complication. The needle was removed intact.  Once the entire procedure was completed, the treated area was cleaned, making sure to leave some of the prepping solution back to take advantage of its long term bactericidal properties.         Illustration of the posterior view of the lumbar spine and the posterior neural structures. Laminae of L2 through S1 are labeled. DPRL5, dorsal primary ramus of L5; DPRS1, dorsal primary ramus of S1; DPR3, dorsal primary ramus of L3; FJ, facet (zygapophyseal) joint L3-L4; I, inferior articular process of L4; LB1, lateral branch of dorsal primary ramus of L1; IAB, inferior articular branches from L3 medial branch (supplies L4-L5 facet joint); IBP, intermediate branch plexus; MB3, medial branch of dorsal primary ramus of L3; NR3, third lumbar nerve root; S, superior articular process of L5; SAB, superior articular branches from L4 (supplies L4-5 facet joint also); TP3, transverse process of L3.  Vitals:   11/12/21 0930 11/12/21 0935 11/12/21 0940 11/12/21 0949  BP: (!) 177/99 (!) 166/99 (!) 161/101 (!) 145/82  Pulse:      Resp: '18 18 20 14  '$ Temp:      TempSrc:      SpO2: 100% 100% 100% 95%  Weight:      Height:         Start Time: 0931 hrs. End Time: 0940 hrs.  Imaging Guidance (Spinal):          Type of Imaging Technique: Fluoroscopy Guidance (Spinal) Indication(s): Assistance in needle guidance and placement for procedures requiring needle placement in or near specific anatomical locations not easily accessible without such assistance. Exposure Time: Please see nurses notes. Contrast: None  used. Fluoroscopic Guidance: I was personally present during the use of fluoroscopy. "Tunnel Vision Technique" used to obtain the best possible view of the target area. Parallax error corrected before commencing the procedure. "Direction-depth-direction" technique used to introduce the needle under continuous pulsed fluoroscopy. Once target was reached, antero-posterior, oblique, and lateral fluoroscopic projection used confirm needle placement in all planes. Images permanently stored in EMR. Interpretation: No contrast injected. I personally interpreted the imaging intraoperatively. Adequate needle placement confirmed in multiple planes. Permanent images saved into the patient's record.  Antibiotic Prophylaxis:   Anti-infectives (From admission, onward)    None      Indication(s): None identified  Post-operative Assessment:  Post-procedure Vital Signs:  Pulse/HCG Rate: 7180 Temp: (!) 97.2 F (36.2 C) Resp: 14 BP: (!) 145/82 SpO2: 95 %  EBL: None  Complications: No immediate post-treatment complications observed by team, or reported by patient.  Note: The patient tolerated the entire procedure well. A repeat set of vitals were taken after the procedure and the patient was kept under observation following institutional policy, for this type of procedure. Post-procedural neurological assessment was performed, showing return to baseline, prior to discharge. The patient was provided with post-procedure discharge instructions, including a section on how to identify potential problems. Should any problems arise concerning this procedure, the patient was given instructions to immediately contact us, at any time, without hesitation. In any case, we plan to contact the patient by telephone for a follow-up status report regarding this interventional procedure.  Comments:  No additional relevant information.  Plan of Care  Orders:  Orders Placed This Encounter  Procedures   LUMBAR  FACET(MEDIAL  BRANCH NERVE BLOCK) MBNB    Scheduling Instructions:     Procedure: Lumbar facet block (AKA.: Lumbosacral medial branch nerve block)     Side: Bilateral     Level: L3-4 & L5-S1 Facets (L2, L3, L4, L5, & S1 Medial Branch Nerves)     Sedation: Patient's choice.     Timeframe: Today    Order Specific Question:   Where will this procedure be performed?    Answer:   [facility]   DG [facility] C-ARM 1-60 MIN NO REPORT    Intraoperative interpretation by procedural physician at [place].    Standing Status:   Standing    Number of Occurrences:   1    Order Specific Question:   Reason for exam:    Answer:   Assistance in needle guidance and placement for procedures requiring needle placement in or near specific anatomical locations not easily accessible without such assistance.   Informed Consent Details: Physician/Practitioner Attestation; Transcribe to consent form and obtain patient signature    Nursing Order: Transcribe to consent form and obtain patient signature. Note: Always confirm laterality of pain with [name], before procedure.    Order Specific Question:   Physician/Practitioner attestation of informed consent for procedure/surgical case    Answer:   I, the physician/practitioner, attest that I have discussed with the patient the benefits, risks, side effects, alternatives, likelihood of achieving goals and potential problems during recovery for the procedure that I have provided informed consent.    Order Specific Question:   Procedure    Answer:   Lumbar Facet Block  under fluoroscopic guidance    Order Specific Question:   Physician/Practitioner performing the procedure    Answer:   Oland Arquette A. Dossie Arbour MD    Order Specific Question:   Indication/Reason    Answer:   Low Back Pain, with our without leg pain, due to Facet Joint Arthralgia (Joint Pain) Spondylosis (Arthritis of the Spine), without myelopathy or radiculopathy (Nerve Damage).    Provide equipment / supplies at bedside    "Block Tray" (Disposable  single use) Needle type: SpinalSpinal Amount/quantity: 4 Size: Regular (3.5-inch) Gauge: 22G    Standing Status:   Standing    Number of Occurrences:   1    Order Specific Question:   Specify    Answer:   Block Tray   Chronic Opioid Analgesic:  Tramadol 50 mg, 1 tab PO QD. ABNORMAL UDS (09/03/2020) (+) for COCAINE. MME: 5 mg/day   Medications ordered for procedure: Meds ordered this encounter  Medications   lidocaine (XYLOCAINE) 2 % (with pres) injection 400 mg   pentafluoroprop-tetrafluoroeth (GEBAUERS) aerosol   lactated ringers infusion   midazolam (VERSED) 5 MG/5ML injection 0.5-2 mg    Make sure Flumazenil is available in the pyxis when using this medication. If oversedation occurs, administer 0.2 mg IV over 15 sec. If after 45 sec no response, administer 0.2 mg again over 1 min; may repeat at 1 min intervals; not to exceed 4 doses (1 mg)   fentaNYL (SUBLIMAZE) injection 25-50 mcg    Make sure Narcan is available in the pyxis when using this medication. In the event of respiratory depression (RR< 8/min): Titrate NARCAN (naloxone) in increments of 0.1 to 0.2 mg IV at 2-3 minute intervals, until desired degree of reversal.   ropivacaine (PF) 2 mg/mL (0.2%) (NAROPIN) injection 18 mL   Medications administered: We administered lidocaine, pentafluoroprop-tetrafluoroeth, lactated ringers, midazolam, and ropivacaine (PF) 2 mg/mL (0.2%).  See  the medical record for exact dosing, route, and time of administration.  Follow-up plan:   Return in about 2 weeks (around 11/26/2021) for Proc-day (T,Th), (F2F), (PPE).       Interventional Therapies  Risk  Complexity Considerations:   Estimated body mass index is 31.91 kg/m as calculated from the following:   Height as of 12/27/20: '4\' 11"'$  (1.499 m).   Weight as of 12/27/20: 158 lb (71.7 kg).   NOTE: UNCONTROLLED IDDM (NO STEROIDS)  ABNORMAL UDS (09/03/2020) (+) for  COCAINE   Planned  Pending:      Under consideration:   Possible bilateral lumbar facet RFA  Diagnostic bilateral genicular NB    Completed:   Diagnostic right lumbar facet MBB x3 (no steroid) (03/26/2021) (100/100/50/50)  Diagnostic left lumbar facet MBB x1 (w/ steroid) (10/09/2020) (100/100/90/L-100; R-50)  Therapeutic left L5 TFESI x4 (09/21/2018) (100/100/50/70)  Therapeutic left L4-5 LESI x4 (09/21/2018) (100/100/50/70)  Therapeutic left L5-S1 LESI x2 (02/25/2018) (100/100/100/100)  Therapeutic bilateral Hyalgan knee inj. x5 (02/25/2018) (100/100/100 x2 weeks/<50)  Therapeutic left IA steroid knee injection x1 (11/23/2017) (100/100/0/0)    Therapeutic  Palliative (PRN) options:   Palliative L5 TFESI  Palliative L4-5 LESI   Palliative L5-S1 LESI   Palliative Hyalgan knee inj.     Recent Visits Date Type Provider Dept  10/30/21 Office Visit Milinda Pointer, MD [facility]  Showing recent visits within past 90 days and meeting all other requirements Today's Visits Date Type Provider Dept  11/12/21 Procedure visit Milinda Pointer, MD [facility]  Showing today's visits and meeting all other requirements Future Appointments Date Type Provider Dept  12/03/21 Appointment Milinda Pointer, MD [facility]  Showing future appointments within next 90 days and meeting all other requirements  Disposition: Discharge home  Discharge (Date  Time): 11/12/2021; 1000 hrs.   Primary Care Physician: Tracie Harrier, MD Location: [facility] Note by: Gaspar Cola, MD Date: 11/12/2021; Time: 1:13 PM  Disclaimer:  Medicine is not an Chief Strategy Officer. The only guarantee in medicine is that nothing is guaranteed. It is important to note that the decision to proceed with this intervention was based on the information collected from the patient. The Data and conclusions were drawn from the patient's questionnaire, the  interview, and the physical examination. Because the information was provided in large part by the patient, it cannot be guaranteed that it has not been purposely or unconsciously manipulated. Every effort has been made to obtain as much relevant data as possible for this evaluation. It is important to note that the conclusions that lead to this procedure are derived in large part from the available data. Always take into account that the treatment will also be dependent on availability of resources and existing treatment guidelines, considered by other Pain Management Practitioners as being common knowledge and practice, at the time of the intervention. For Medico-Legal purposes, it is also important to point out that variation in procedural techniques and pharmacological choices are the acceptable norm. The indications, contraindications, technique, and results of the above procedure should only be interpreted and judged by a Board-Certified Interventional Pain Specialist with extensive familiarity and expertise in the same exact procedure and technique.

## 2021-11-12 ENCOUNTER — Ambulatory Visit: Payer: Medicare Other | Attending: Pain Medicine | Admitting: Pain Medicine

## 2021-11-12 ENCOUNTER — Ambulatory Visit
Admission: RE | Admit: 2021-11-12 | Discharge: 2021-11-12 | Disposition: A | Discharge status: Home / Self Care | Payer: Medicare Other | Source: Ambulatory Visit | Attending: Pain Medicine | Admitting: Pain Medicine

## 2021-11-12 ENCOUNTER — Encounter: Payer: Self-pay | Admitting: Pain Medicine

## 2021-11-12 VITALS — BP 145/82 | HR 71 | Temp 97.2°F | Resp 14 | Ht 59.0 in | Wt 145.0 lb

## 2021-11-12 DIAGNOSIS — M4316 Spondylolisthesis, lumbar region: Secondary | ICD-10-CM | POA: Diagnosis not present

## 2021-11-12 DIAGNOSIS — G8929 Other chronic pain: Secondary | ICD-10-CM | POA: Insufficient documentation

## 2021-11-12 DIAGNOSIS — M5136 Other intervertebral disc degeneration, lumbar region: Secondary | ICD-10-CM | POA: Insufficient documentation

## 2021-11-12 DIAGNOSIS — M47817 Spondylosis without myelopathy or radiculopathy, lumbosacral region: Secondary | ICD-10-CM | POA: Insufficient documentation

## 2021-11-12 DIAGNOSIS — M47816 Spondylosis without myelopathy or radiculopathy, lumbar region: Secondary | ICD-10-CM | POA: Diagnosis present

## 2021-11-12 DIAGNOSIS — M545 Low back pain, unspecified: Secondary | ICD-10-CM | POA: Diagnosis present

## 2021-11-12 MED ORDER — LACTATED RINGERS IV SOLN: Freq: Once | INTRAVENOUS | Status: AC

## 2021-11-12 MED ORDER — FENTANYL CITRATE (PF) 100 MCG/2ML IJ SOLN: 25.0000 ug | INTRAMUSCULAR | Status: DC | PRN

## 2021-11-12 MED ORDER — ROPIVACAINE HCL 2 MG/ML IJ SOLN
INTRAMUSCULAR | Status: AC
  Filled 2021-11-12: qty 20

## 2021-11-12 MED ORDER — ROPIVACAINE HCL 2 MG/ML IJ SOLN
18.0000 mL | Freq: Once | INTRAMUSCULAR | Status: AC
  Administered 2021-11-12: 18 mL via PERINEURAL

## 2021-11-12 MED ORDER — PENTAFLUOROPROP-TETRAFLUOROETH EX AERO
INHALATION_SPRAY | Freq: Once | CUTANEOUS | Status: AC
  Administered 2021-11-12: 30 via TOPICAL
  Filled 2021-11-12: qty 116

## 2021-11-12 MED ORDER — MIDAZOLAM HCL 5 MG/5ML IJ SOLN
0.5000 mg | Freq: Once | INTRAMUSCULAR | Status: AC
  Administered 2021-11-12: 2 mg via INTRAVENOUS

## 2021-11-12 MED ORDER — LIDOCAINE HCL 2 % IJ SOLN
20.0000 mL | Freq: Once | INTRAMUSCULAR | Status: AC
  Administered 2021-11-12: 400 mg

## 2021-11-12 NOTE — Patient Instructions (Signed)
____________________________________________________________________________________________  Post-Procedure Discharge Instructions  Instructions: Apply ice:  Purpose: This will minimize any swelling and discomfort after procedure.  When: Day of procedure, as soon as you get home. How: Fill a plastic sandwich bag with crushed ice. Cover it with a small towel and apply to injection site. How long: (15 min on, 15 min off) Apply for 15 minutes then remove x 15 minutes.  Repeat sequence on day of procedure, until you go to bed. Apply heat:  Purpose: To treat any soreness and discomfort from the procedure. When: Starting the next day after the procedure. How: Apply heat to procedure site starting the day following the procedure. How long: May continue to repeat daily, until discomfort goes away. Food intake: Start with clear liquids (like water) and advance to regular food, as tolerated.  Physical activities: Keep activities to a minimum for the first 8 hours after the procedure. After that, then as tolerated. Driving: If you have received any sedation, be responsible and do not drive. You are not allowed to drive for 24 hours after having sedation. Blood thinner: (Applies only to those taking blood thinners) You may restart your blood thinner 6 hours after your procedure. Insulin: (Applies only to Diabetic patients taking insulin) As soon as you can eat, you may resume your normal dosing schedule. Infection prevention: Keep procedure site clean and dry. Shower daily and clean area with soap and water. Post-procedure Pain Diary: Extremely important that this be done correctly and accurately. Recorded information will be used to determine the next step in treatment. For the purpose of accuracy, follow these rules: Evaluate only the area treated. Do not report or include pain from an untreated area. For the purpose of this evaluation, ignore all other areas of pain, except for the treated  area. After your procedure, avoid taking a long nap and attempting to complete the pain diary after you wake up. Instead, set your alarm clock to go off every hour, on the hour, for the initial 8 hours after the procedure. Document the duration of the numbing medicine, and the relief you are getting from it. Do not go to sleep and attempt to complete it later. It will not be accurate. If you received sedation, it is likely that you were given a medication that may cause amnesia. Because of this, completing the diary at a later time may cause the information to be inaccurate. This information is needed to plan your care. Follow-up appointment: Keep your post-procedure follow-up evaluation appointment after the procedure (usually 2 weeks for most procedures, 6 weeks for radiofrequencies). DO NOT FORGET to bring you pain diary with you.   Expect: (What should I expect to see with my procedure?) From numbing medicine (AKA: Local Anesthetics): Numbness or decrease in pain. You may also experience some weakness, which if present, could last for the duration of the local anesthetic. Onset: Full effect within 15 minutes of injected. Duration: It will depend on the type of local anesthetic used. On the average, 1 to 8 hours.  From procedure: Some discomfort is to be expected once the numbing medicine wears off. This should be minimal if ice and heat are applied as instructed.  Call if: (When should I call?) You experience numbness and weakness that gets worse with time, as opposed to wearing off. New onset bowel or bladder incontinence. (Applies only to procedures done in the spine)  Emergency Numbers: Durning business hours (Monday - Thursday, 8:00 AM - 4:00 PM) (Friday, 9:00 AM -   12:00 Noon): [phone] After hours: [phone] NOTE: If you are having a problem and are unable connect with, or to talk to a provider, then go to your nearest urgent care or emergency department. If the problem is  serious and urgent, please call 911. ____________________________________________________________________________________________    

## 2021-11-12 NOTE — Progress Notes (Signed)
1100 Wasted '3mg'$  of Versed and 151mg Fentanyl in waste container with VAlinda Sierrasas witness.

## 2021-11-12 NOTE — Progress Notes (Signed)
Safety precautions to be maintained throughout the outpatient stay will include: orient to surroundings, keep bed in low position, maintain call bell within reach at all times, provide assistance with transfer out of bed and ambulation.  

## 2021-11-13 ENCOUNTER — Telehealth: Payer: Self-pay

## 2021-11-13 NOTE — Telephone Encounter (Signed)
Post procedure phone call.  LM 

## 2021-12-03 ENCOUNTER — Encounter: Payer: Self-pay | Admitting: Pain Medicine

## 2021-12-03 ENCOUNTER — Ambulatory Visit: Payer: Medicare Other | Attending: Pain Medicine | Admitting: Pain Medicine

## 2021-12-03 VITALS — BP 163/94 | HR 71 | Temp 97.2°F | Resp 14 | Ht 59.0 in | Wt 145.0 lb

## 2021-12-03 DIAGNOSIS — M25561 Pain in right knee: Secondary | ICD-10-CM | POA: Insufficient documentation

## 2021-12-03 DIAGNOSIS — M25562 Pain in left knee: Secondary | ICD-10-CM | POA: Insufficient documentation

## 2021-12-03 DIAGNOSIS — G894 Chronic pain syndrome: Secondary | ICD-10-CM | POA: Diagnosis not present

## 2021-12-03 DIAGNOSIS — M545 Low back pain, unspecified: Secondary | ICD-10-CM | POA: Diagnosis present

## 2021-12-03 DIAGNOSIS — M79604 Pain in right leg: Secondary | ICD-10-CM | POA: Diagnosis not present

## 2021-12-03 DIAGNOSIS — M79605 Pain in left leg: Secondary | ICD-10-CM | POA: Insufficient documentation

## 2021-12-03 DIAGNOSIS — M47816 Spondylosis without myelopathy or radiculopathy, lumbar region: Secondary | ICD-10-CM | POA: Insufficient documentation

## 2021-12-03 DIAGNOSIS — G8929 Other chronic pain: Secondary | ICD-10-CM | POA: Insufficient documentation

## 2021-12-03 DIAGNOSIS — M170 Bilateral primary osteoarthritis of knee: Secondary | ICD-10-CM | POA: Diagnosis present

## 2021-12-03 NOTE — Progress Notes (Signed)
PROVIDER NOTE: Information contained herein reflects review and annotations entered in association with encounter. Interpretation of such information and data should be left to medically-trained personnel. Information provided to patient can be located elsewhere in the medical record under "Patient Instructions". Document created using STT-dictation technology, any transcriptional errors that may result from process are unintentional.    Patient: Charlene [name]  Service Category: E/M  Provider: Gaspar Cola, MD  DOB: Jan 11, 1961  DOS: 12/03/2021  Referring Provider: Tracie Harrier, MD  MRN: [id number]  Specialty: Interventional Pain Management  PCP: Tracie Harrier, MD  Type: Established Patient  Setting: Ambulatory outpatient    Location: Office  Delivery: Face-to-face     HPI  Charlene [name], a 61 y.o. year old female, is here today because of her Chronic bilateral low back pain without sciatica [M54.50, G89.29]. Ms. Frazee primary complain today is Knee Pain (left) Last encounter: My last encounter with her was on 11/12/2021. 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[place]); Chronic ankle pain (Left); Chronic foot pain (Left); Chronic lower extremity pain (2ry area of Pain) (Bilateral) (L>R); Chronic knee pain (4th area of Pain) (Bilateral) (L>R); Chronic low back pain (3ry area of Pain) (Bilateral) (L>R) w/ sciatica (Left); Chronic pain syndrome; DM type 2 with diabetic peripheral neuropathy (HCC); DDD (degenerative disc disease), lumbar; Lumbar facet arthropathy; Lumbar facet syndrome; Lumbar facet hypertrophy; Lumbar foraminal stenosis (L5-S1) (Right); Osteoarthritis of knee (Bilateral); Chronic musculoskeletal pain; Abnormal EMG (07/06/2017); Osteoarthritis involving multiple joints; 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[place]); Knee pain; Back pain with left-sided sciatica; Meningioma ([place]); Neck pain; Chronic pain disorder; Left leg weakness; Meralgia paraesthetica, left; Pain in both lower extremities; Arthropathy of lumbar facet joint; Chronic ankle and foot pain (1ry area of Pain) (Left); 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 Pain Assessment: Severity of Chronic pain is reported as a 1 /10. Location: Knee Right/denies. Onset: More than a month ago. Quality: Aching, Dull. Timing: Intermittent. Modifying factor(s): medications, rest. Vitals:  height is _0  (1.499 m) and weight is 145 lb (65.8 kg). Her temporal temperature is 97.2 F (36.2 C) (abnormal). Her blood pressure is 163/94 (abnormal) and her pulse is 71. Her respiration is 14 and oxygen saturation is 100%.   Reason for encounter: post-procedure evaluation and assessment.  The patient refers having attained a 100% relief of the pain from the original local anesthetic which in fact has gone down to an ongoing 90% improvement of her low back pain.  We have already done the 2 diagnostic facet blocks on the left side and on the right side we have done for diagnostic injections all of which have provided her with similar results.  Based on that, I believe the patient to be an excellent candidate for radiofrequency ablation.  Today I have talked to the patient about that procedure and I have informed her that should the pain start going back, that is an option that she has to extend on the duration of  benefit.  Today the patient indicates that the primary pain  that she is experiencing right now is that of the left knee.  Again, because she has uncontrolled diabetes, she is not a good candidate for steroid injections in the knees and therefore I have offered her to do a viscosupplementation injection for her left knee.  In the past we had used some Hyalgan, but we can probably offer her an option where she does not have to have as many injections.  The patient indicated being interested in this option and therefore we will go ahead and schedule her to come back for a left intra-articular Monovisc injection.  Post-procedure evaluation   Type: Lumbar Facet, Medial Branch Block(s)  R4  L2 (NO STEROIDS) Laterality: Bilateral  Level: L2, L3, L4, L5, & S1 Medial Branch Level(s). Injecting these levels blocks the L3-4, L4-5 and L5-S1 lumbar facet joints. Imaging: Fluoroscopic guidance         Anesthesia: Local anesthesia (1-2% Lidocaine) Anxiolysis: IV Versed 2.0 mg Sedation: Moderate Sedation None required. No Fentanyl administered.         DOS: 11/12/2021 Performed by: Gaspar Cola, MD  Primary Purpose: Diagnostic/Therapeutic Indications: Low back pain severe enough to impact quality of life or function. 1. Lumbar facet syndrome   2. Lumbar facet hypertrophy   3. Lumbar facet arthropathy   4. Spondylosis without myelopathy or radiculopathy, lumbosacral region   5. Chronic low back pain (Bilateral) w/o sciatica   6. DDD (degenerative disc disease), lumbar    NAS-11 Pain score:   Pre-procedure: 1 /10   Post-procedure: 0-No pain/10      Effectiveness:  Initial hour after procedure: 100 %. Subsequent 4-6 hours post-procedure: 100 %. Analgesia past initial 6 hours: 90 %. Ongoing improvement:  Analgesic: The patient indicates having attained an ongoing 90% improvement of the low back pain.  This is interesting sounds I did not use any steroids.  This would suggest that she could be a  really good candidate for radiofrequency ablation. Function: Charlene [name] reports improvement in function ROM: Charlene [name] reports improvement in ROM  Pharmacotherapy Assessment  Analgesic: Tramadol 50 mg, 1 tab PO QD. ABNORMAL UDS (09/03/2020) (+) for COCAINE. MME: 5 mg/day   Monitoring: [place] PMP: PDMP reviewed during this encounter.       Pharmacotherapy: No side-effects or adverse reactions reported. Compliance: No problems identified. Effectiveness: Clinically acceptable.  No notes on file  No results found for: "CBDTHCR" No results found for: "D8THCCBX" No results found for: "D9THCCBX"  UDS:  Summary  Date Value Ref Range Status  03/11/2021 Note  Final    Comment:    ==================================================================== [employer] Select 13 (MW) ==================================================================== Test                             Result       Flag       Units    NO DRUGS DETECTED. ==================================================================== Test                      Result    Flag   Units      Ref Range   Creatinine              123              mg/dL      >=20 ==================================================================== Declared Medications:  The flagging and interpretation on this report are based on the  following declared medications.  Unexpected results may arise from  inaccuracies in the declared medications.   **Note: The testing scope of this panel does not include the  following reported medications:   Alendronate (Fosamax)  Amlodipine (Norvasc)  Baclofen (Lioresal)  Carvedilol (Coreg)  Dicyclomine (Bentyl)  Duloxetine (Cymbalta)  Hydralazine (Apresoline)  Insulin (Toujeo)  Lisinopril (Zestril)  Magnesium  Metformin (Glucophage)  Pantoprazole (Protonix)  Polyethylene Glycol  Pravastatin (Pravachol)  Pregabalin (Lyrica)  Sucralfate (Carafate)  Vitamin  D3 ==================================================================== For clinical consultation, please call [phone]. ====================================================================       ROS  Constitutional: Denies any fever or chills Gastrointestinal: No reported hemesis, hematochezia, vomiting, or acute GI distress Musculoskeletal: Denies any acute onset joint swelling, redness, loss of ROM, or weakness Neurological: No reported episodes of acute onset apraxia, aphasia, dysarthria, agnosia, amnesia, paralysis, loss of coordination, or loss of consciousness  Medication Review  Cholecalciferol, DULoxetine, FreeStyle Libre Sensor System, Magnesium, amLODipine, amoxicillin, baclofen, carvedilol, dicyclomine, hydrALAZINE, insulin glargine (2 Unit Dial), insulin lispro, lisinopril, metFORMIN, pantoprazole, polyethylene glycol, pravastatin, pregabalin, sucralfate, and traMADol  History Review  Allergy: Ms. Link is allergic to semaglutide. Drug: Ms. Tusing  reports no history of drug use. Alcohol:  reports that she does not currently use alcohol. Tobacco:  reports that she has never smoked. She has never used smokeless tobacco. Social: Ms. Blew  reports that she has never smoked. She has never used smokeless tobacco. She reports that she does not currently use alcohol. She reports that she does not use drugs. Medical:  has a past medical history of Arthritis, Degenerative disc disease, lumbar, Diabetes mellitus without complication ([place]), GERD (gastroesophageal reflux disease), Hypertension, Neuropathy, Polyneuropathy, and Polyneuropathy. Surgical: Ms. Cuyler  has a past surgical history that includes arthroscopic rotator cuff; Colonoscopy; Abdominal hysterectomy; endosopic sinus; and Cataract extraction w/PHACO (Right, 11/25/2017). Family: family history includes Alcohol abuse in her father; Breast cancer in her maternal aunt; Cancer in her father.  Laboratory Chemistry  Profile   Renal Lab Results  Component Value Date   BUN 13 12/23/2018   CREATININE 0.59 12/23/2018   BCR 17 10/12/2017   GFRAA >60 12/23/2018   GFRNONAA >60 12/23/2018    Hepatic Lab Results  Component Value Date   AST 17 10/21/2018   ALT 21 10/21/2018   ALBUMIN 3.6 10/21/2018   ALKPHOS 81 10/21/2018   LIPASE 20 10/21/2018    Electrolytes Lab Results  Component Value Date   NA 137 12/23/2018   K 4.1 12/23/2018   CL 100 12/23/2018   CALCIUM 9.8 12/23/2018   MG 2.0 10/12/2017    Bone Lab Results  Component Value Date   25OHVITD1 17 (L) 10/12/2017   25OHVITD2 <1.0 10/12/2017   25OHVITD3 17 10/12/2017    Inflammation (CRP: Acute Phase) (ESR: Chronic Phase) Lab Results  Component Value Date   CRP 4 10/12/2017   ESRSEDRATE 12 10/12/2017         Note: Above Lab results reviewed.  Recent Imaging Review  DG PAIN CLINIC C-ARM 1-60 MIN NO REPORT Fluoro was used, but no Radiologist interpretation will be provided.  Please refer to "NOTES" tab for provider progress note. Note: Reviewed        Physical Exam  General appearance: Well nourished, well developed, and well hydrated. In no apparent acute distress Mental status: Alert, oriented x 3 (person, place, & time)       Respiratory: No evidence of acute respiratory distress Eyes: PERLA Vitals: BP (!) 163/94   Pulse 71   Temp (!) 97.2 F (36.2 C) (Temporal)  Resp 14   Ht _0  (1.499 m)   Wt 145 lb (65.8 kg)   SpO2 100%   BMI 29.29 kg/m  BMI: Estimated body mass index is 29.29 kg/m as calculated from the following:   Height as of this encounter: _1  (1.499 m).   Weight as of this encounter: 145 lb (65.8 kg). Ideal: Patient must be at least 60 in tall to calculate ideal body weight  Assessment   Diagnosis Status  1. Chronic low back pain (Bilateral) w/o sciatica   2. Lumbar facet syndrome   3. Chronic lower extremity pain (2ry area of Pain) (Bilateral) (L>R)   4. Chronic pain syndrome   5. Chronic  knee pain (4th area of Pain) (Bilateral) (L>R)   6. Osteoarthritis of knee (Bilateral)    Controlled Controlled Controlled   Updated Problems: No problems updated.   Plan of Care  Problem-specific:  No problem-specific Assessment & Plan notes found for this encounter.  Ms. FENDI MEINHARDT has a current medication list which includes the following long-term medication(s): amlodipine, carvedilol, cholecalciferol, dicyclomine, duloxetine, hydralazine, insulin lispro, lisinopril, pantoprazole, pravastatin, pregabalin, sucralfate, tramadol, baclofen, toujeo max solostar, and magnesium.  Pharmacotherapy (Medications Ordered): No orders of the defined types were placed in this encounter.  Orders:  Orders Placed This Encounter  Procedures   KNEE INJECTION    Indications: Knee arthralgia (pain) due to osteoarthritis (OA) Imaging: None ([id number]) Nursing: Please request pharmacy to have Monovisc (Hyaluronan) available in office.    Standing Status:   Future    Standing Expiration Date:   03/05/2022    Scheduling Instructions:     Procedure: Knee injection Monovisc (Hyaluronan)     Treatment No.: 1     Level: Intra-articular     Laterality: Left Knee     Sedation: Patient's choice.     Timeframe: As soon as schedule allows     Total Hyaluronic acid (AKA hyaluronan or hyaluronate) per injection: 88 mg (22.0 mg/mL x 4 mL). Series of 1 injection.    Order Specific Question:   Where will this procedure be performed?    Answer:   [facility]   Follow-up plan:   Return for procedure (Clinic): (L) IA Monovisc Knee inj. #1.     Interventional Therapies  Risk  Complexity Considerations:   Estimated body mass index is 31.91 kg/m as calculated from the following:   Height as of 12/27/20: _2  (1.499 m).   Weight as of 12/27/20: 158 lb (71.7 kg).   NOTE: UNCONTROLLED IDDM (NO STEROIDS)  ABNORMAL UDS (09/03/2020) (+) for COCAINE   Planned  Pending:   Therapeutic left IA  Monovisc knee injection #1    Under consideration:   Possible bilateral lumbar facet RFA  Diagnostic bilateral genicular NB    Completed:   Diagnostic right lumbar facet MBB x3 (no steroid) (03/26/2021) (100/100/50/50)  Diagnostic left lumbar facet MBB x1 (w/ steroid) (10/09/2020) (100/100/90/L-100; R-50)  Therapeutic left L5 TFESI x4 (09/21/2018) (100/100/50/70)  Therapeutic left L4-5 LESI x4 (09/21/2018) (100/100/50/70)  Therapeutic left L5-S1 LESI x2 (02/25/2018) (100/100/100/100)  Therapeutic bilateral Hyalgan knee inj. x5 (02/25/2018) (100/100/100 x2 weeks/<50)  Therapeutic left IA steroid knee injection x1 (11/23/2017) (100/100/0/0)    Therapeutic  Palliative (PRN) options:   Palliative L5 TFESI  Palliative L4-5 LESI   Palliative L5-S1 LESI   Palliative Hyalgan knee inj.      Recent Visits Date Type Provider Dept  11/12/21 Procedure visit Milinda Pointer, MD [facility]  10/30/21 Office Visit Milinda Pointer, MD [facility]  Showing recent visits within past 90 days and meeting all other requirements Today's Visits Date Type Provider Dept  12/03/21 Office Visit Milinda Pointer, MD [facility]  Showing today's visits and meeting all other requirements Future Appointments No visits were found meeting these conditions. Showing future appointments within next 90 days and meeting all other requirements  I discussed the assessment and treatment plan with the patient. The patient was provided an opportunity to ask questions and all were answered. The patient agreed with the plan and demonstrated an understanding of the instructions.  Patient advised to call back or seek an in-person evaluation if the symptoms or condition worsens.  Duration of encounter: 30 minutes.  Total time on encounter, as per AMA guidelines included both the face-to-face and non-face-to-face time personally spent by the physician and/or other qualified health care  professional(s) on the day of the encounter (includes time in activities that require the physician or other qualified health care professional and does not include time in activities normally performed by clinical staff). Physician's time may include the following activities when performed: preparing to see the patient (eg, review of tests, pre-charting review of records) obtaining and/or reviewing separately obtained history performing a medically appropriate examination and/or evaluation counseling and educating the patient/family/caregiver ordering medications, tests, or procedures referring and communicating with other health care professionals (when not separately reported) documenting clinical information in the electronic or other health record independently interpreting results (not separately reported) and communicating results to the patient/ family/caregiver care coordination (not separately reported)  Note by: Gaspar Cola, MD Date: 12/03/2021; Time: 2:31 PM

## 2021-12-03 NOTE — Patient Instructions (Signed)
______________________________________________________________________  Preparing for your procedure  During your procedure appointment there will be: No Prescription Refills. No disability issues to discussed. No medication changes or discussions.  Instructions: Food intake: Avoid eating anything solid for at least 8 hours prior to your procedure. Clear liquid intake: You may take clear liquids such as water up to 2 hours prior to your procedure. (No carbonated drinks. No soda.) Transportation: Unless otherwise stated by your physician, bring a driver. Morning Medicines: Except for blood thinners, take all of your other morning medications with a sip of water. Make sure to take your heart and blood pressure medicines. If your blood pressure's lower number is above 100, the case will be rescheduled. Blood thinners: If you take a blood thinner, but were not instructed to stop it, call our office [phone] and ask to talk to a nurse. Not stopping a blood thinner prior to certain procedures could lead to serious complications. Diabetics on insulin: Notify the staff so that you can be scheduled 1st case in the morning. If your diabetes requires high dose insulin, take only  of your normal insulin dose the morning of the procedure and notify the staff that you have done so. Preventing infections: Shower with an antibacterial soap the morning of your procedure.  Build-up your immune system: Take 1000 mg of Vitamin C with every meal (3 times a day) the day prior to your procedure. Antibiotics: Inform the nursing staff if you are taking any antibiotics or if you have any conditions that may require antibiotics prior to procedures. (Example: recent joint implants)   Pregnancy: If you are pregnant make sure to notify the nursing staff. Not doing so may result in injury to the fetus, including death.  Sickness: If you have a cold, fever, or any active infections, call and cancel or reschedule your  procedure. Receiving steroids while having an infection may result in complications. Arrival: You must be in the facility at least 30 minutes prior to your scheduled procedure. Tardiness: Your scheduled time is also the cutoff time. If you do not arrive at least 15 minutes prior to your procedure, you will be rescheduled.  Children: Do not bring any children with you. Make arrangements to keep them home. Dress appropriately: There is always a possibility that your clothing may get soiled. Avoid long dresses. Valuables: Do not bring any jewelry or valuables.  Reasons to call and reschedule or cancel your procedure: (Following these recommendations will minimize the risk of a serious complication.) Surgeries: Avoid having procedures within 2 weeks of any surgery. (Avoid for 2 weeks before or after any surgery). Flu Shots: Avoid having procedures within 2 weeks of a flu shots or . (Avoid for 2 weeks before or after immunizations). Barium: Avoid having a procedure within 7-10 days after having had a radiological study involving the use of radiological contrast. (Myelograms, Barium swallow or enema study). Heart attacks: Avoid any elective procedures or surgeries for the initial 6 months after a "Myocardial Infarction" (Heart Attack). Blood thinners: It is imperative that you stop these medications before procedures. Let us know if you if you take any blood thinner.  Infection: Avoid procedures during or within two weeks of an infection (including chest colds or gastrointestinal problems). Symptoms associated with infections include: Localized redness, fever, chills, night sweats or profuse sweating, burning sensation when voiding, cough, congestion, stuffiness, runny nose, sore throat, diarrhea, nausea, vomiting, cold or Flu symptoms, recent or current infections. It is specially important if the infection is   over the area that we intend to treat. Heart and lung problems: Symptoms that may suggest an  active cardiopulmonary problem include: cough, chest pain, breathing difficulties or shortness of breath, dizziness, ankle swelling, uncontrolled high or unusually low blood pressure, and/or palpitations. If you are experiencing any of these symptoms, cancel your procedure and contact your primary care physician for an evaluation.  Remember:  Regular Business hours are:  Monday to Thursday 8:00 AM to 4:00 PM  Provider's Schedule: Elleanor Guyett, MD:  Procedure days: Tuesday and Thursday 7:30 AM to 4:00 PM  Bilal Lateef, MD:  Procedure days: Monday and Wednesday 7:30 AM to 4:00 PM  ______________________________________________________________________    ____________________________________________________________________________________________  General Risks and Possible Complications  Patient Responsibilities: It is important that you read this as it is part of your informed consent. It is our duty to inform you of the risks and possible complications associated with treatments offered to you. It is your responsibility as a patient to read this and to ask questions about anything that is not clear or that you believe was not covered in this document.  Patient's Rights: You have the right to refuse treatment. You also have the right to change your mind, even after initially having agreed to have the treatment done. However, under this last option, if you wait until the last second to change your mind, you may be charged for the materials used up to that point.  Introduction: Medicine is not an exact science. Everything in Medicine, including the lack of treatment(s), carries the potential for danger, harm, or loss (which is by definition: Risk). In Medicine, a complication is a secondary problem, condition, or disease that can aggravate an already existing one. All treatments carry the risk of possible complications. The fact that a side effects or complications occurs, does not imply  that the treatment was conducted incorrectly. It must be clearly understood that these can happen even when everything is done following the highest safety standards.  No treatment: You can choose not to proceed with the proposed treatment alternative. The "PRO(s)" would include: avoiding the risk of complications associated with the therapy. The "CON(s)" would include: not getting any of the treatment benefits. These benefits fall under one of three categories: diagnostic; therapeutic; and/or palliative. Diagnostic benefits include: getting information which can ultimately lead to improvement of the disease or symptom(s). Therapeutic benefits are those associated with the successful treatment of the disease. Finally, palliative benefits are those related to the decrease of the primary symptoms, without necessarily curing the condition (example: decreasing the pain from a flare-up of a chronic condition, such as incurable terminal cancer).  General Risks and Complications: These are associated to most interventional treatments. They can occur alone, or in combination. They fall under one of the following six (6) categories: no benefit or worsening of symptoms; bleeding; infection; nerve damage; allergic reactions; and/or death. No benefits or worsening of symptoms: In Medicine there are no guarantees, only probabilities. No healthcare provider can ever guarantee that a medical treatment will work, they can only state the probability that it may. Furthermore, there is always the possibility that the condition may worsen, either directly, or indirectly, as a consequence of the treatment. Bleeding: This is more common if the patient is taking a blood thinner, either prescription or over the counter (example: Goody Powders, Fish oil, Aspirin, Garlic, etc.), or if suffering a condition associated with impaired coagulation (example: Hemophilia, cirrhosis of the liver, low platelet counts, etc.). However, even if   you  do not have one on these, it can still happen. If you have any of these conditions, or take one of these drugs, make sure to notify your treating physician. Infection: This is more common in patients with a compromised immune system, either due to disease (example: diabetes, cancer, human immunodeficiency virus [HIV], etc.), or due to medications or treatments (example: therapies used to treat cancer and rheumatological diseases). However, even if you do not have one on these, it can still happen. If you have any of these conditions, or take one of these drugs, make sure to notify your treating physician. Nerve Damage: This is more common when the treatment is an invasive one, but it can also happen with the use of medications, such as those used in the treatment of cancer. The damage can occur to small secondary nerves, or to large primary ones, such as those in the spinal cord and brain. This damage may be temporary or permanent and it may lead to impairments that can range from temporary numbness to permanent paralysis and/or brain death. Allergic Reactions: Any time a substance or material comes in contact with our body, there is the possibility of an allergic reaction. These can range from a mild skin rash (contact dermatitis) to a severe systemic reaction (anaphylactic reaction), which can result in death. Death: In general, any medical intervention can result in death, most of the time due to an unforeseen complication. ____________________________________________________________________________________________    

## 2021-12-04 ENCOUNTER — Telehealth: Payer: Self-pay

## 2021-12-04 NOTE — Telephone Encounter (Signed)
Monovisc is not covered by [facility]. We have to use [facility] for her knee injections.

## 2021-12-10 ENCOUNTER — Ambulatory Visit: Payer: Medicare Other | Attending: Pain Medicine | Admitting: Pain Medicine

## 2021-12-10 ENCOUNTER — Encounter: Payer: Self-pay | Admitting: Pain Medicine

## 2021-12-10 VITALS — BP 150/83 | HR 73 | Temp 97.0°F | Resp 18 | Ht 59.0 in | Wt 145.0 lb

## 2021-12-10 DIAGNOSIS — E1165 Type 2 diabetes mellitus with hyperglycemia: Secondary | ICD-10-CM | POA: Diagnosis present

## 2021-12-10 DIAGNOSIS — M25561 Pain in right knee: Secondary | ICD-10-CM | POA: Insufficient documentation

## 2021-12-10 DIAGNOSIS — M170 Bilateral primary osteoarthritis of knee: Secondary | ICD-10-CM | POA: Insufficient documentation

## 2021-12-10 DIAGNOSIS — G8929 Other chronic pain: Secondary | ICD-10-CM | POA: Diagnosis present

## 2021-12-10 DIAGNOSIS — M25562 Pain in left knee: Secondary | ICD-10-CM | POA: Insufficient documentation

## 2021-12-10 DIAGNOSIS — M1712 Unilateral primary osteoarthritis, left knee: Secondary | ICD-10-CM | POA: Diagnosis not present

## 2021-12-10 MED ORDER — ROPIVACAINE HCL 2 MG/ML IJ SOLN
5.0000 mL | Freq: Once | INTRAMUSCULAR | Status: AC
  Administered 2021-12-10: 5 mL via INTRA_ARTICULAR

## 2021-12-10 MED ORDER — SODIUM HYALURONATE (VISCOSUP) 16.8 MG/2ML IX SOSY
16.8000 mg | PREFILLED_SYRINGE | Freq: Once | INTRA_ARTICULAR | Status: AC
  Administered 2021-12-10: 16.8 mg via INTRA_ARTICULAR
  Filled 2021-12-10: qty 2

## 2021-12-10 MED ORDER — LIDOCAINE HCL (PF) 1 % IJ SOLN
INTRAMUSCULAR | Status: AC
  Filled 2021-12-10: qty 5

## 2021-12-10 MED ORDER — ROPIVACAINE HCL 2 MG/ML IJ SOLN
INTRAMUSCULAR | Status: AC
  Filled 2021-12-10: qty 20

## 2021-12-10 MED ORDER — LIDOCAINE HCL (PF) 1 % IJ SOLN
5.0000 mL | Freq: Once | INTRAMUSCULAR | Status: AC
  Administered 2021-12-10: 5 mL

## 2021-12-10 NOTE — Patient Instructions (Signed)
____________________________________________________________________________________________  Post-Procedure Discharge Instructions  Instructions: Apply ice:  Purpose: This will minimize any swelling and discomfort after procedure.  When: Day of procedure, as soon as you get home. How: Fill a plastic sandwich bag with crushed ice. Cover it with a small towel and apply to injection site. How long: (15 min on, 15 min off) Apply for 15 minutes then remove x 15 minutes.  Repeat sequence on day of procedure, until you go to bed. Apply heat:  Purpose: To treat any soreness and discomfort from the procedure. When: Starting the next day after the procedure. How: Apply heat to procedure site starting the day following the procedure. How long: May continue to repeat daily, until discomfort goes away. Food intake: Start with clear liquids (like water) and advance to regular food, as tolerated.  Physical activities: Keep activities to a minimum for the first 8 hours after the procedure. After that, then as tolerated. Driving: If you have received any sedation, be responsible and do not drive. You are not allowed to drive for 24 hours after having sedation. Blood thinner: (Applies only to those taking blood thinners) You may restart your blood thinner 6 hours after your procedure. Insulin: (Applies only to Diabetic patients taking insulin) As soon as you can eat, you may resume your normal dosing schedule. Infection prevention: Keep procedure site clean and dry. Shower daily and clean area with soap and water. Post-procedure Pain Diary: Extremely important that this be done correctly and accurately. Recorded information will be used to determine the next step in treatment. For the purpose of accuracy, follow these rules: Evaluate only the area treated. Do not report or include pain from an untreated area. For the purpose of this evaluation, ignore all other areas of pain, except for the treated  area. After your procedure, avoid taking a long nap and attempting to complete the pain diary after you wake up. Instead, set your alarm clock to go off every hour, on the hour, for the initial 8 hours after the procedure. Document the duration of the numbing medicine, and the relief you are getting from it. Do not go to sleep and attempt to complete it later. It will not be accurate. If you received sedation, it is likely that you were given a medication that may cause amnesia. Because of this, completing the diary at a later time may cause the information to be inaccurate. This information is needed to plan your care. Follow-up appointment: Keep your post-procedure follow-up evaluation appointment after the procedure (usually 2 weeks for most procedures, 6 weeks for radiofrequencies). DO NOT FORGET to bring you pain diary with you.   Expect: (What should I expect to see with my procedure?) From numbing medicine (AKA: Local Anesthetics): Numbness or decrease in pain. You may also experience some weakness, which if present, could last for the duration of the local anesthetic. Onset: Full effect within 15 minutes of injected. Duration: It will depend on the type of local anesthetic used. On the average, 1 to 8 hours.  From steroids (Applies only if steroids were used): Decrease in swelling or inflammation. Once inflammation is improved, relief of the pain will follow. Onset of benefits: Depends on the amount of swelling present. The more swelling, the longer it will take for the benefits to be seen. In some cases, up to 10 days. Duration: Steroids will stay in the system x 2 weeks. Duration of benefits will depend on multiple posibilities including persistent irritating factors. Side-effects: If present, they   may typically last 2 weeks (the duration of the steroids). Frequent: Cramps (if they occur, drink Gatorade and take over-the-counter Magnesium 450-500 mg once to twice a day); water retention with  temporary weight gain; increases in blood sugar; decreased immune system response; increased appetite. Occasional: Facial flushing (red, warm cheeks); mood swings; menstrual changes. Uncommon: Long-term decrease or suppression of natural hormones; bone thinning. (These are more common with higher doses or more frequent use. This is why we prefer that our patients avoid having any injection therapies in other practices.)  Very Rare: Severe mood changes; psychosis; aseptic necrosis. From procedure: Some discomfort is to be expected once the numbing medicine wears off. This should be minimal if ice and heat are applied as instructed.  Call if: (When should I call?) You experience numbness and weakness that gets worse with time, as opposed to wearing off. New onset bowel or bladder incontinence. (Applies only to procedures done in the spine)  Emergency Numbers: Durning business hours (Monday - Thursday, 8:00 AM - 4:00 PM) (Friday, 9:00 AM - 12:00 Noon): [phone] After hours: [phone] NOTE: If you are having a problem and are unable connect with, or to talk to a provider, then go to your nearest urgent care or emergency department. If the problem is serious and urgent, please call 911. ____________________________________________________________________________________________    ______________________________________________________________________  Preparing for your procedure  During your procedure appointment there will be: No Prescription Refills. No disability issues to discussed. No medication changes or discussions.  Instructions: Food intake: Avoid eating anything solid for at least 8 hours prior to your procedure. Clear liquid intake: You may take clear liquids such as water up to 2 hours prior to your procedure. (No carbonated drinks. No soda.) Transportation: Unless otherwise stated by your physician, bring a driver. Morning Medicines: Except for blood thinners, take  all of your other morning medications with a sip of water. Make sure to take your heart and blood pressure medicines. If your blood pressure's lower number is above 100, the case will be rescheduled. Blood thinners: If you take a blood thinner, but were not instructed to stop it, call our office [phone] and ask to talk to a nurse. Not stopping a blood thinner prior to certain procedures could lead to serious complications. Diabetics on insulin: Notify the staff so that you can be scheduled 1st case in the morning. If your diabetes requires high dose insulin, take only  of your normal insulin dose the morning of the procedure and notify the staff that you have done so. Preventing infections: Shower with an antibacterial soap the morning of your procedure.  Build-up your immune system: Take 1000 mg of Vitamin C with every meal (3 times a day) the day prior to your procedure. Antibiotics: Inform the nursing staff if you are taking any antibiotics or if you have any conditions that may require antibiotics prior to procedures. (Example: recent joint implants)   Pregnancy: If you are pregnant make sure to notify the nursing staff. Not doing so may result in injury to the fetus, including death.  Sickness: If you have a cold, fever, or any active infections, call and cancel or reschedule your procedure. Receiving steroids while having an infection may result in complications. Arrival: You must be in the facility at least 30 minutes prior to your scheduled procedure. Tardiness: Your scheduled time is also the cutoff time. If you do not arrive at least 15 minutes prior to your procedure, you will be rescheduled.  Children: Do not bring any children with you. Make arrangements to keep them home. Dress appropriately: There is always a possibility that your clothing may get soiled. Avoid long dresses. Valuables: Do not bring any jewelry or valuables.  Reasons to call and reschedule or cancel your  procedure: (Following these recommendations will minimize the risk of a serious complication.) Surgeries: Avoid having procedures within 2 weeks of any surgery. (Avoid for 2 weeks before or after any surgery). Flu Shots: Avoid having procedures within 2 weeks of a flu shots or . (Avoid for 2 weeks before or after immunizations). Barium: Avoid having a procedure within 7-10 days after having had a radiological study involving the use of radiological contrast. (Myelograms, Barium swallow or enema study). Heart attacks: Avoid any elective procedures or surgeries for the initial 6 months after a "Myocardial Infarction" (Heart Attack). Blood thinners: It is imperative that you stop these medications before procedures. Let us know if you if you take any blood thinner.  Infection: Avoid procedures during or within two weeks of an infection (including chest colds or gastrointestinal problems). Symptoms associated with infections include: Localized redness, fever, chills, night sweats or profuse sweating, burning sensation when voiding, cough, congestion, stuffiness, runny nose, sore throat, diarrhea, nausea, vomiting, cold or Flu symptoms, recent or current infections. It is specially important if the infection is over the area that we intend to treat. Heart and lung problems: Symptoms that may suggest an active cardiopulmonary problem include: cough, chest pain, breathing difficulties or shortness of breath, dizziness, ankle swelling, uncontrolled high or unusually low blood pressure, and/or palpitations. If you are experiencing any of these symptoms, cancel your procedure and contact your primary care physician for an evaluation.  Remember:  Regular Business hours are:  Monday to Thursday 8:00 AM to 4:00 PM  Provider's Schedule: Milinda Pointer, MD:  Procedure days: Tuesday and Thursday 7:30 AM to 4:00 PM  Gillis Santa, MD:  Procedure days: Monday and Wednesday 7:30 AM to 4:00  PM  ______________________________________________________________________    ____________________________________________________________________________________________  General Risks and Possible Complications  Patient Responsibilities: It is important that you read this as it is part of your informed consent. It is our duty to inform you of the risks and possible complications associated with treatments offered to you. It is your responsibility as a patient to read this and to ask questions about anything that is not clear or that you believe was not covered in this document.  Patient's Rights: You have the right to refuse treatment. You also have the right to change your mind, even after initially having agreed to have the treatment done. However, under this last option, if you wait until the last second to change your mind, you may be charged for the materials used up to that point.  Introduction: Medicine is not an Chief Strategy Officer. Everything in Medicine, including the lack of treatment(s), carries the potential for danger, harm, or loss (which is by definition: Risk). In Medicine, a complication is a secondary problem, condition, or disease that can aggravate an already existing one. All treatments carry the risk of possible complications. The fact that a side effects or complications occurs, does not imply that the treatment was conducted incorrectly. It must be clearly understood that these can happen even when everything is done following the highest safety standards.  No treatment: You can choose not to proceed with the proposed treatment alternative. The "PRO(s)" would include: avoiding the risk of complications associated with the therapy. The "CON(s)" would  include: not getting any of the treatment benefits. These benefits fall under one of three categories: diagnostic; therapeutic; and/or palliative. Diagnostic benefits include: getting information which can ultimately lead to  improvement of the disease or symptom(s). Therapeutic benefits are those associated with the successful treatment of the disease. Finally, palliative benefits are those related to the decrease of the primary symptoms, without necessarily curing the condition (example: decreasing the pain from a flare-up of a chronic condition, such as incurable terminal cancer).  General Risks and Complications: These are associated to most interventional treatments. They can occur alone, or in combination. They fall under one of the following six (6) categories: no benefit or worsening of symptoms; bleeding; infection; nerve damage; allergic reactions; and/or death. No benefits or worsening of symptoms: In Medicine there are no guarantees, only probabilities. No healthcare provider can ever guarantee that a medical treatment will work, they can only state the probability that it may. Furthermore, there is always the possibility that the condition may worsen, either directly, or indirectly, as a consequence of the treatment. Bleeding: This is more common if the patient is taking a blood thinner, either prescription or over the counter (example: Goody Powders, Fish oil, Aspirin, Garlic, etc.), or if suffering a condition associated with impaired coagulation (example: Hemophilia, cirrhosis of the liver, low platelet counts, etc.). However, even if you do not have one on these, it can still happen. If you have any of these conditions, or take one of these drugs, make sure to notify your treating physician. Infection: This is more common in patients with a compromised immune system, either due to disease (example: diabetes, cancer, human immunodeficiency virus [HIV], etc.), or due to medications or treatments (example: therapies used to treat cancer and rheumatological diseases). However, even if you do not have one on these, it can still happen. If you have any of these conditions, or take one of these drugs, make sure to notify  your treating physician. Nerve Damage: This is more common when the treatment is an invasive one, but it can also happen with the use of medications, such as those used in the treatment of cancer. The damage can occur to small secondary nerves, or to large primary ones, such as those in the spinal cord and brain. This damage may be temporary or permanent and it may lead to impairments that can range from temporary numbness to permanent paralysis and/or brain death. Allergic Reactions: Any time a substance or material comes in contact with our body, there is the possibility of an allergic reaction. These can range from a mild skin rash (contact dermatitis) to a severe systemic reaction (anaphylactic reaction), which can result in death. Death: In general, any medical intervention can result in death, most of the time due to an unforeseen complication. ____________________________________________________________________________________________

## 2021-12-10 NOTE — Progress Notes (Signed)
Safety precautions to be maintained throughout the outpatient stay will include: orient to surroundings, keep bed in low position, maintain call bell within reach at all times, provide assistance with transfer out of bed and ambulation.  

## 2021-12-10 NOTE — Progress Notes (Signed)
PROVIDER NOTE: Interpretation of information contained herein should be left to medically-trained personnel. Specific patient instructions are provided elsewhere under "Patient Instructions" section of medical record. This document was created in part using STT-dictation technology, any transcriptional errors that may result from this process are unintentional.  Patient: Charlene [name] Type: Established DOB: Apr 12, 1960 MRN: [id number] PCP: Tracie Harrier, MD  Service: Procedure DOS: 12/10/2021 Setting: Ambulatory Location: Ambulatory outpatient facility Delivery: Face-to-face Provider: Gaspar Cola, MD Specialty: Interventional Pain Management Specialty designation: 09 Location: Outpatient facility Ref. Prov.: Tracie Harrier, MD    Primary Reason for Visit: Interventional Pain Management Treatment. CC: Knee Pain (Left )   Procedure:           Type: Gelsyn-3 Intra-articular Knee Injection #1  Laterality: Left (-LT) Level/approach: Lateral Imaging guidance: None required ([id number]) Anesthesia: Local anesthesia (1-2% Lidocaine) Anxiolysis: None                 Sedation: No Sedation                       DOS: 12/10/2021  Performed by: Gaspar Cola, MD  Purpose: Diagnostic/Therapeutic Indications: Knee arthralgia associated to osteoarthritis of the knee 1. Chronic knee pain (Left)   2. Osteoarthritis of knee (Left)   3. Chronic knee pain (4th area of Pain) (Bilateral) (L>R)   4. Osteoarthritis of knee (Bilateral)   5. Uncontrolled type 2 diabetes mellitus with hyperglycemia (HCC)    NAS-11 score:   Pre-procedure: 1 /10   Post-procedure: 1 /10     Pre-Procedure Preparation  Monitoring: As per clinic protocol.  Risk Assessment: Vitals:  FAO:[id number] body mass index is 29.29 kg/m as calculated from the following:   Height as of this encounter: '4\' 11"'$  (1.499 m).   Weight as of this encounter: 145 lb (65.8 kg)., Rate:77 , BP:(!) 141/82, Resp:18, Temp:(!) 97  F (36.1 C), SpO2:100 %  Allergies: She is allergic to semaglutide.  Precautions: No additional precautions required  Blood-thinner(s): None at this time  Coagulopathies: Reviewed. None identified.   Active Infection(s): Reviewed. None identified. Ms. Duda is afebrile   Location setting: Exam room Position: Sitting w/ knee bent 90 degrees Safety Precautions: Patient was assessed for positional comfort and pressure points before starting the procedure. Prepping solution: DuraPrep (Iodine Povacrylex [0.7% available iodine] and Isopropyl Alcohol, 74% w/w) Prep Area: Entire knee region Approach: percutaneous, just above the tibial plateau, lateral to the infrapatellar tendon. Intended target: Intra-articular knee space Materials: Tray: Block Needle(s): Regular Qty: 1/side Length: 1.5-inch Gauge: 25G   Meds ordered this encounter  Medications   lidocaine (PF) (XYLOCAINE) 1 % injection 5 mL   ropivacaine (PF) 2 mg/mL (0.2%) (NAROPIN) injection 5 mL   sodium hyaluronate (viscosup) (GELSYN-3) intra-articular injection 16.8 mg    Do not substitute. Deliver to facility day before procedure.    Orders Placed This Encounter  Procedures   KNEE INJECTION    Indications: Knee arthralgia (pain) due to osteoarthritis (OA) Imaging: None ([id number]) Position: Sitting Equipment/Materials: Block tray  1.5", 25-G (one per side)  Local anesthetic  Monovisc (one per side) Confirm availability (in office) of Monovisc (HMW hyaluronan)    Scheduling Instructions:     Procedure: Knee injection Monovisc (Hyaluronan/Hyaluronic acid)     Treatment No.: 1     Level: Intra-articular     Laterality: Left Knee     Sedation: Patient's choice.    Order Specific Question:   Where will this procedure be performed?  Answer:   [facility]   KNEE INJECTION    Indications: Knee arthralgia (pain) due to osteoarthritis (OA) Imaging: None ([id number]) Nursing: Please request pharmacy to have Gelsyn-3  (Hyaluronan) available in office.    Standing Status:   Future    Standing Expiration Date:   03/12/2022    Scheduling Instructions:     Procedure: Knee injection Monovisc (Hyaluronan)     Treatment No.: 2     Level: Intra-articular     Laterality: Left Knee     Sedation: Patient's choice.     Timeframe: in two (2) weeks     Total Hyaluronic acid (AKA hyaluronan or hyaluronate) per injection: 16.8 mg (8.40 mg/mL x 57m). Series of 3 injections. (Weekly)    Order Specific Question:   Where will this procedure be performed?    Answer:   [facility]   Informed Consent Details: Physician/Practitioner Attestation; Transcribe to consent form and obtain patient signature    Nursing Order: Transcribe to consent form and obtain patient signature. Note: Always confirm laterality of pain with Ms. ODanne Baxter before procedure.    Order Specific Question:   Physician/Practitioner attestation of informed consent for procedure/surgical case    Answer:   I, the physician/practitioner, attest that I have discussed with the patient the benefits, risks, side effects, alternatives, likelihood of achieving goals and potential problems during recovery for the procedure that I have provided informed consent.    Order Specific Question:   Procedure    Answer:   Therapeutic intra-articular viscosupplementation knee injection    Order Specific Question:   Physician/Practitioner performing the procedure    Answer:   Charo Philipp A. NDossie Arbour MD    Order Specific Question:   Indication/Reason    Answer:   Chronic knee pain secondary to primary osteoarthritis of the knee  (Right-M17.11), (Left-M17.12) or (Bilateral-M17.0)   Provide equipment / supplies at bedside    "Block Tray" (Disposable  single use) Skin infiltration needle: Regular - 1.5-in, 25-G, (x1) Block Needle type: Regular Amount/quantity: 1 Size: Short(1.5-inch) Gauge: 22G    Standing Status:   Standing    Number of Occurrences:   1    Order Specific  Question:   Specify    Answer:   Block Tray     Time-out: 13419I initiated and conducted the "Time-out" before starting the procedure, as per protocol. The patient was asked to participate by confirming the accuracy of the "Time Out" information. Verification of the correct person, site, and procedure were performed and confirmed by me, the nursing staff, and the patient. "Time-out" conducted as per Joint Commission's Universal Protocol (UP.01.01.01). Procedure checklist: Completed   H&P (Pre-op  Assessment)  Ms. OSurgesis a 61y.o. (year old), female patient, seen today for interventional treatment. She  has a past surgical history that includes arthroscopic rotator cuff; Colonoscopy; Abdominal hysterectomy; endosopic sinus; and Cataract extraction w/PHACO (Right, 11/25/2017). Ms. OCarrerashas a current medication list which includes the following prescription(s): amlodipine, amoxicillin, carvedilol, cholecalciferol, freestyle libre sensor system, dicyclomine, duloxetine, hydralazine, toujeo max solostar, insulin lispro, lisinopril, metformin, pantoprazole, polyethylene glycol, pravastatin, pregabalin, sucralfate, tramadol, baclofen, and magnesium. Her primarily concern today is the Knee Pain (Left )  She is allergic to semaglutide.   Last encounter: My last encounter with her was on 12/03/2021. Pertinent problems: Ms. OFleekhas Burning sensation of feet; Neuropathic pain; Neuropathy; Lower extremity numbness and tingling (Left); Polyneuropathy associated with underlying disease (H[place]; Chronic ankle pain (Left); Chronic foot pain (Left); Chronic  lower extremity pain (2ry area of Pain) (Bilateral) (L>R); Chronic knee pain (4th area of Pain) (Bilateral) (L>R); Chronic low back pain (3ry area of Pain) (Bilateral) (L>R) w/ sciatica (Left); Chronic pain syndrome; DM type 2 with diabetic peripheral neuropathy (HCC); DDD (degenerative disc disease), lumbar; Lumbar facet arthropathy; Lumbar facet syndrome; Lumbar  facet hypertrophy; Lumbar foraminal stenosis (L5-S1) (Right); Osteoarthritis of knee (Bilateral); Chronic musculoskeletal pain; Abnormal EMG (07/06/2017); Osteoarthritis involving multiple joints; 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[place]); Knee pain; Back pain with left-sided sciatica; Meningioma ([place]); Neck pain; Chronic pain disorder; Left leg weakness; Meralgia paraesthetica, left; Pain in both lower extremities; Arthropathy of lumbar facet joint; Chronic ankle and foot pain (1ry area of Pain) (Left); 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 Pain Assessment: Severity of Chronic pain is reported as a 1 /10. Location: Knee Left/Denies. Onset: More than a month ago. Quality: Aching, Dull, Constant. Timing: Constant. Modifying factor(s): Medication, rest, and knee injection. Vitals:  height is '4\' 11"'$  (1.499 m) and weight is 145 lb (65.8 kg). Her temporal temperature is 97 F (36.1 C) (abnormal). Her blood pressure is 150/83 (abnormal) and her pulse is 73. Her respiration is 18 and oxygen saturation is 100%.   Reason for encounter: "interventional pain management therapy due pain of at least four (4) weeks in duration, with failure to respond and/or inability to tolerate more conservative care.   Site Confirmation: Ms. Cosman was asked to confirm the procedure and laterality before marking the site.  Consent: Before the procedure and under the influence of no sedative(s), amnesic(s),  or anxiolytics, the patient was informed of the treatment options, risks and possible complications. To fulfill our ethical and legal obligations, as recommended by the American Medical Association's Code of Ethics, I have informed the patient of my clinical impression; the nature and purpose of the treatment or procedure; the risks, benefits, and possible complications of the intervention; the alternatives, including doing nothing; the risk(s) and benefit(s) of the alternative treatment(s) or procedure(s); and the risk(s) and benefit(s) of doing nothing. The patient was provided information about the general risks and possible complications associated with the procedure. These may include, but are not limited to: failure to achieve desired goals, infection, bleeding, organ or nerve damage, allergic reactions, paralysis, and death. In addition, the patient was informed of those risks and complications associated to Spine-related procedures, such as failure to decrease pain; infection (i.e.: Meningitis, epidural or intraspinal abscess); bleeding (i.e.: epidural hematoma, subarachnoid hemorrhage, or any other type of intraspinal or peri-dural bleeding); organ or nerve damage (i.e.: Any type of peripheral nerve, nerve root, or spinal cord injury) with subsequent damage to sensory, motor, and/or autonomic systems, resulting in permanent pain, numbness, and/or weakness of one or several areas of the body; allergic reactions; (i.e.: anaphylactic reaction); and/or death. Furthermore, the patient was informed of those risks and complications associated with the medications. These include, but are not limited to: allergic reactions (i.e.: anaphylactic or anaphylactoid reaction(s)); adrenal axis suppression; blood sugar elevation that in diabetics may result in ketoacidosis or comma; water retention that in patients with history of  congestive heart failure may result in shortness of breath, pulmonary edema, and  decompensation with resultant heart failure; weight gain; swelling or edema; medication-induced neural toxicity; particulate matter embolism and blood vessel occlusion with resultant organ, and/or nervous system infarction; and/or aseptic necrosis of one or more joints. Finally, the patient was informed that Medicine is not an exact science; therefore, there is also the possibility of unforeseen or unpredictable risks and/or possible complications that may result in a catastrophic outcome. The patient indicated having understood very clearly. We have given the patient no guarantees and we have made no promises. Enough time was given to the patient to ask questions, all of which were answered to the patient's satisfaction. Ms. Gerling has indicated that she wanted to continue with the procedure. Attestation: I, the ordering provider, attest that I have discussed with the patient the benefits, risks, side-effects, alternatives, likelihood of achieving goals, and potential problems during recovery for the procedure that I have provided informed consent.  Date  Time: 12/10/2021 11:00 AM   Prophylactic antibiotics  Anti-infectives (From admission, onward)    None      Indication(s): None identified   Description of procedure   Start Time: 1109 hrs  Local Anesthesia: Once the patient was positioned, prepped, and time-out was completed. The target area was identified located. The skin was marked with an approved surgical skin marker. Once marked, the skin (epidermis, dermis, and hypodermis), and deeper tissues (fat, connective tissue and muscle) were infiltrated with a small amount of a short-acting local anesthetic, loaded on a 10cc syringe with a 25G, 1.5-in  Needle. An appropriate amount of time was allowed for local anesthetics to take effect before proceeding to the next step. Local Anesthetic: Lidocaine 1-2% The unused portion of the local anesthetic was discarded in the proper designated  containers. Safety Precautions: Aspiration looking for blood return was conducted prior to all injections. At no point did I inject any substances, as a needle was being advanced. Before injecting, the patient was told to immediately notify me if she was experiencing any new onset of "ringing in the ears, or metallic taste in the mouth". No attempts were made at seeking any paresthesias. Safe injection practices and needle disposal techniques used. Medications properly checked for expiration dates. SDV (single dose vial) medications used. After the completion of the procedure, all disposable equipment used was discarded in the proper designated medical waste containers.  Technical description: Protocol guidelines were followed. After positioning, the target area was identified and prepped in the usual manner. Skin & deeper tissues infiltrated with local anesthetic. Appropriate amount of time allowed to pass for local anesthetics to take effect. Proper needle placement secured. Once satisfactory needle placement was confirmed, I proceeded to inject the desired solution in slow, incremental fashion, intermittently assessing for discomfort or any signs of abnormal or undesired spread of substance. Once completed, the needle was removed and disposed of, as per hospital protocols. The area was cleaned, making sure to leave some of the prepping solution back to take advantage of its long term bactericidal properties.  Aspiration:  Negative          Vitals:   12/10/21 1057 12/10/21 1100  BP: (!) 141/82 (!) 150/83  Pulse: 77 73  Resp: 18   Temp: (!) 97 F (36.1 C)   TempSrc: Temporal   SpO2: 100%   Weight: 145 lb (65.8 kg)   Height: '4\' 11"'$  (1.499 m)     End Time: 1110 hrs   Imaging  guidance  Imaging-assisted Technique: None required. Indication(s): N/A Exposure Time: N/A Contrast: None Fluoroscopic Guidance: N/A Ultrasound Guidance: N/A Interpretation: N/A   Post-op assessment   Post-procedure Vital Signs:  Pulse/HCG Rate: 73  Temp: (!) 97 F (36.1 C) Resp: 18 BP: (!) 150/83 SpO2: 100 %  EBL: None  Complications: No immediate post-treatment complications observed by team, or reported by patient.  Note: The patient tolerated the entire procedure well. A repeat set of vitals were taken after the procedure and the patient was kept under observation following institutional policy, for this type of procedure. Post-procedural neurological assessment was performed, showing return to baseline, prior to discharge. The patient was provided with post-procedure discharge instructions, including a section on how to identify potential problems. Should any problems arise concerning this procedure, the patient was given instructions to immediately contact us, at any time, without hesitation. In any case, we plan to contact the patient by telephone for a follow-up status report regarding this interventional procedure.  Comments:  No additional relevant information.   Plan of care  Chronic Opioid Analgesic:  Tramadol 50 mg, 1 tab PO QD. ABNORMAL UDS (09/03/2020) (+) for COCAINE. MME: 5 mg/day   Medications administered: We administered lidocaine (PF), ropivacaine (PF) 2 mg/mL (0.2%), and sodium hyaluronate (viscosup).  Follow-up plan:   Return in about 2 weeks (around 12/24/2021) for [facility]): (L) IA Gelsyn Knee inj. #2 & (PPE).      Interventional Therapies  Risk  Complexity Considerations:   Estimated body mass index is 31.91 kg/m as calculated from the following:   Height as of 12/27/20: '4\' 11"'$  (1.499 m).   Weight as of 12/27/20: 158 lb (71.7 kg).   NOTE: UNCONTROLLED IDDM (NO STEROIDS)  ABNORMAL UDS (09/03/2020) (+) for COCAINE   Planned  Pending:   Therapeutic left IA Monovisc knee injection #1    Under consideration:   Possible bilateral lumbar facet RFA  Diagnostic bilateral genicular NB    Completed:   Diagnostic right lumbar facet MBB x3 (no steroid)  (03/26/2021) (100/100/50/50)  Diagnostic left lumbar facet MBB x1 (w/ steroid) (10/09/2020) (100/100/90/L-100; R-50)  Therapeutic left L5 TFESI x4 (09/21/2018) (100/100/50/70)  Therapeutic left L4-5 LESI x4 (09/21/2018) (100/100/50/70)  Therapeutic left L5-S1 LESI x2 (02/25/2018) (100/100/100/100)  Therapeutic bilateral Hyalgan knee inj. x5 (02/25/2018) (100/100/100 x2 weeks/<50)  Therapeutic left IA steroid knee injection x1 (11/23/2017) (100/100/0/0)    Therapeutic  Palliative (PRN) options:   Palliative L5 TFESI  Palliative L4-5 LESI   Palliative L5-S1 LESI   Palliative Hyalgan knee inj.       Recent Visits Date Type Provider Dept  12/03/21 Office Visit Milinda Pointer, MD [facility]  11/12/21 Procedure visit Milinda Pointer, MD [facility]  10/30/21 Office Visit Milinda Pointer, MD [facility]  Showing recent visits within past 90 days and meeting all other requirements Today's Visits Date Type Provider Dept  12/10/21 Procedure visit Milinda Pointer, MD [facility]  Showing today's visits and meeting all other requirements Future Appointments Date Type Provider Dept  12/24/21 Appointment Milinda Pointer, MD [facility]  Showing future appointments within next 90 days and meeting all other requirements   Disposition: Discharge home  Discharge (Date  Time): 12/10/2021; 1111 hrs.   Primary Care Physician: Tracie Harrier, MD Location: [facility] Note by: Gaspar Cola, MD Date: 12/10/2021; Time: 11:59 AM  DISCLAIMER: Medicine is not an Chief Strategy Officer. It has no guarantees or warranties. The decision to proceed with this  intervention was based on the information collected from the patient. Conclusions were drawn from the patient's questionnaire, interview, and examination. Because information was provided in large part by the patient, it cannot be guaranteed that it has not been  purposely or unconsciously manipulated or altered. Every effort has been made to obtain as much accurate, relevant, available data as possible. Always take into account that the treatment will also be dependent on availability of resources and existing treatment guidelines, considered by other Pain Management Specialists as being common knowledge and practice, at the time of the intervention. It is also important to point out that variation in procedural techniques and pharmacological choices are the acceptable norm. For Medico-Legal review purposes, the indications, contraindications, technique, and results of the these procedures should only be evaluated, judged and interpreted by a Board-Certified Interventional Pain Specialist with extensive familiarity and expertise in the same exact procedure and technique.

## 2021-12-11 ENCOUNTER — Telehealth: Payer: Self-pay | Admitting: *Deleted

## 2021-12-11 NOTE — Telephone Encounter (Signed)
Attempted to call for post procedure follow-up. No answer.Voice mailbox not set up.

## 2021-12-23 NOTE — Progress Notes (Unsigned)
PROVIDER NOTE: Interpretation of information contained herein should be left to medically-trained personnel. Specific patient instructions are provided elsewhere under "Patient Instructions" section of medical record. This document was created in part using STT-dictation technology, any transcriptional errors that may result from this process are unintentional.  Patient: Charlene [name] Type: Established DOB: [birth year] MRN: [id number] PCP: Tracie Harrier, MD  Service: Procedure DOS: 12/24/2021 Setting: Ambulatory Location: Ambulatory outpatient facility Delivery: Face-to-face Provider: Gaspar Cola, MD Specialty: Interventional Pain Management Specialty designation: 09 Location: Outpatient facility Ref. Prov.: Tracie Harrier, MD    Primary Reason for Visit: Interventional Pain Management Treatment. CC: No chief complaint on file.   Procedure:           Type: Gelsyn-3 Intra-articular Knee Injection #2  Laterality: Left (-LT) Level/approach: Lateral Imaging guidance: None required ([id number]) Anesthesia: Local anesthesia (1-2% Lidocaine) Anxiolysis: None                 Sedation:                         DOS: 12/24/2021  Performed by: Gaspar Cola, MD  Purpose: Diagnostic/Therapeutic Indications: Knee arthralgia associated to osteoarthritis of the knee 1. Chronic knee pain (Left)   2. Osteoarthritis of knee (Left)    NAS-11 score:   Pre-procedure:  /10   Post-procedure:  /10     Pre-Procedure Preparation  Monitoring: As per clinic protocol.  Risk Assessment: Vitals:  [id number] body mass index is 29.29 kg/m as calculated from the following:   Height as of 12/10/21: '4\' 11"'$  (1.499 m).   Weight as of 12/10/21: 145 lb (65.8 kg)., Rate:  , BP: , Resp: , Temp: , SpO2:   Allergies: She is allergic to semaglutide.  Precautions: No additional precautions required  Blood-thinner(s): None at this time  Coagulopathies: Reviewed. None identified.   Active  Infection(s): Reviewed. None identified. Charlene [name] is afebrile   Location setting: Exam room Position: Sitting w/ knee bent 90 degrees Safety Precautions: Patient was assessed for positional comfort and pressure points before starting the procedure. Prepping solution: DuraPrep (Iodine Povacrylex [0.7% available iodine] and Isopropyl Alcohol, 74% w/w) Prep Area: Entire knee region Approach: percutaneous, just above the tibial plateau, lateral to the infrapatellar tendon. Intended target: Intra-articular knee space Materials: Tray: Block Needle(s): Regular Qty: 1/side Length: 1.5-inch Gauge: 25G   No orders of the defined types were placed in this encounter.   No orders of the defined types were placed in this encounter.    Time-out:   I initiated and conducted the "Time-out" before starting the procedure, as per protocol. The patient was asked to participate by confirming the accuracy of the "Time Out" information. Verification of the correct person, site, and procedure were performed and confirmed by me, the nursing staff, and the patient. "Time-out" conducted as per Joint Commission's Universal Protocol (UP.01.01.01). Procedure checklist: Completed   H&P (Pre-op  Assessment)  Charlene [name] is a 61 y.o. (year old), female patient, seen today for interventional treatment. She  has a past surgical history that includes arthroscopic rotator cuff; Colonoscopy; Abdominal hysterectomy; endosopic sinus; and Cataract extraction w/PHACO (Right, 11/25/2017). Charlene [name] has a current medication list which includes the following prescription(s): amlodipine, amoxicillin, baclofen, carvedilol, cholecalciferol, freestyle libre sensor system, dicyclomine, duloxetine, hydralazine, toujeo max solostar, insulin lispro, lisinopril, magnesium, metformin, pantoprazole, polyethylene glycol, pravastatin, pregabalin, sucralfate, and tramadol. Her primarily concern today is the No chief complaint on file.  She is  allergic  to semaglutide.   Last encounter: My last encounter with her was on 12/10/2021. 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[place]); Chronic ankle pain (Left); Chronic foot pain (Left); Chronic lower extremity pain (2ry area of Pain) (Bilateral) (L>R); Chronic knee pain (4th area of Pain) (Bilateral) (L>R); Chronic low back pain (3ry area of Pain) (Bilateral) (L>R) w/ sciatica (Left); Chronic pain syndrome; DM type 2 with diabetic peripheral neuropathy (HCC); DDD (degenerative disc disease), lumbar; Lumbar facet arthropathy; Lumbar facet syndrome; Lumbar facet hypertrophy; Lumbar foraminal stenosis (L5-S1) (Right); Osteoarthritis of knee (Bilateral); Chronic musculoskeletal pain; Abnormal EMG (07/06/2017); Osteoarthritis involving multiple joints; 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[place]); Knee pain; Back pain with left-sided sciatica; Meningioma ([place]); Neck pain; Chronic pain disorder; Left leg weakness; Meralgia paraesthetica, left; Pain in both lower extremities; Arthropathy of lumbar facet joint; Chronic ankle and foot pain (1ry area of Pain) (Left); 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 Pain Assessment: Severity of   is reported as a  /10. Location:    / . Onset:  . Quality:  . Timing:  . Modifying factor(s):  Marland Kitchen Vitals:  vitals  were not taken for this visit.   Reason for encounter: "interventional pain management therapy due pain of at least four (4) weeks in duration, with failure to respond and/or inability to tolerate more conservative care.   Site Confirmation: Ms. Fuhrer was asked to confirm the procedure and laterality before marking the site.  Consent: Before the procedure and under the influence of no sedative(s), amnesic(s), or anxiolytics, the patient was informed of the treatment options, risks and possible complications. To fulfill our ethical and legal obligations, as recommended by the American Medical Association's Code of Ethics, I have informed the patient of my clinical impression; the nature and purpose of the treatment or procedure; the risks, benefits, and possible complications of the intervention; the alternatives, including doing nothing; the risk(s) and benefit(s) of the alternative treatment(s) or procedure(s); and the risk(s) and benefit(s) of doing nothing. The patient was provided information about the general risks and possible complications associated with the procedure. These may include, but are not limited to: failure to achieve desired goals, infection, bleeding, organ or nerve damage, allergic reactions, paralysis, and death. In addition, the patient was informed of those risks and complications associated to Spine-related procedures, such as failure to decrease pain; infection (i.e.: Meningitis, epidural or intraspinal abscess); bleeding (i.e.: epidural hematoma, subarachnoid hemorrhage, or any other type of intraspinal or peri-dural bleeding); organ or nerve damage (i.e.: Any type of peripheral nerve, nerve root, or spinal cord injury) with subsequent damage to sensory, motor, and/or autonomic systems, resulting in permanent pain, numbness, and/or weakness of one or several areas of the body; allergic reactions; (i.e.: anaphylactic reaction); and/or death. Furthermore, the patient was informed  of those risks and complications associated with the medications. These include, but are not limited to: allergic reactions (i.e.: anaphylactic or anaphylactoid reaction(s)); adrenal axis suppression; blood sugar elevation that in diabetics may result in ketoacidosis or  comma; water retention that in patients with history of congestive heart failure may result in shortness of breath, pulmonary edema, and decompensation with resultant heart failure; weight gain; swelling or edema; medication-induced neural toxicity; particulate matter embolism and blood vessel occlusion with resultant organ, and/or nervous system infarction; and/or aseptic necrosis of one or more joints. Finally, the patient was informed that Medicine is not an exact science; therefore, there is also the possibility of unforeseen or unpredictable risks and/or possible complications that may result in a catastrophic outcome. The patient indicated having understood very clearly. We have given the patient no guarantees and we have made no promises. Enough time was given to the patient to ask questions, all of which were answered to the patient's satisfaction. Ms. Riemann has indicated that she wanted to continue with the procedure. Attestation: I, the ordering provider, attest that I have discussed with the patient the benefits, risks, side-effects, alternatives, likelihood of achieving goals, and potential problems during recovery for the procedure that I have provided informed consent.  Date  Time: {CH[facility]-PAIN TIME CHOICES:21018001}   Prophylactic antibiotics  Anti-infectives (From admission, onward)    None      Indication(s): None identified   Description of procedure   Start Time:   hrs  Local Anesthesia: Once the patient was positioned, prepped, and time-out was completed. The target area was identified located. The skin was marked with an approved surgical skin marker. Once marked, the skin (epidermis, dermis, and  hypodermis), and deeper tissues (fat, connective tissue and muscle) were infiltrated with a small amount of a short-acting local anesthetic, loaded on a 10cc syringe with a 25G, 1.5-in  Needle. An appropriate amount of time was allowed for local anesthetics to take effect before proceeding to the next step. Local Anesthetic: Lidocaine 1-2% The unused portion of the local anesthetic was discarded in the proper designated containers. Safety Precautions: Aspiration looking for blood return was conducted prior to all injections. At no point did I inject any substances, as a needle was being advanced. Before injecting, the patient was told to immediately notify me if she was experiencing any new onset of "ringing in the ears, or metallic taste in the mouth". No attempts were made at seeking any paresthesias. Safe injection practices and needle disposal techniques used. Medications properly checked for expiration dates. SDV (single dose vial) medications used. After the completion of the procedure, all disposable equipment used was discarded in the proper designated medical waste containers.  Technical description: Protocol guidelines were followed. After positioning, the target area was identified and prepped in the usual manner. Skin & deeper tissues infiltrated with local anesthetic. Appropriate amount of time allowed to pass for local anesthetics to take effect. Proper needle placement secured. Once satisfactory needle placement was confirmed, I proceeded to inject the desired solution in slow, incremental fashion, intermittently assessing for discomfort or any signs of abnormal or undesired spread of substance. Once completed, the needle was removed and disposed of, as per hospital protocols. The area was cleaned, making sure to leave some of the prepping solution back to take advantage of its long term bactericidal properties.  Aspiration:  Negative          There were no vitals filed for this  visit.  End Time:   hrs   Imaging guidance  Imaging-assisted Technique: None required. Indication(s): N/A Exposure Time: N/A Contrast: None Fluoroscopic Guidance: N/A Ultrasound Guidance: N/A Interpretation: N/A   Post-op assessment  Post-procedure Vital Signs:  Pulse/HCG Rate:  Temp:   Resp:   BP:   SpO2:    EBL: None  Complications: No immediate post-treatment complications observed by team, or reported by patient.  Note: The patient tolerated the entire procedure well. A repeat set of vitals were taken after the procedure and the patient was kept under observation following institutional policy, for this type of procedure. Post-procedural neurological assessment was performed, showing return to baseline, prior to discharge. The patient was provided with post-procedure discharge instructions, including a section on how to identify potential problems. Should any problems arise concerning this procedure, the patient was given instructions to immediately contact us, at any time, without hesitation. In any case, we plan to contact the patient by telephone for a follow-up status report regarding this interventional procedure.  Comments:  No additional relevant information.   Plan of care  Chronic Opioid Analgesic:  Tramadol 50 mg, 1 tab PO QD. ABNORMAL UDS (09/03/2020) (+) for COCAINE. MME: 5 mg/day   Medications administered: Terriann L. Burkhammer had no medications administered during this visit.  Follow-up plan:   No follow-ups on file.      Interventional Therapies  Risk  Complexity Considerations:   Estimated body mass index is 31.91 kg/m as calculated from the following:   Height as of 12/27/20: '4\' 11"'$  (1.499 m).   Weight as of 12/27/20: 158 lb (71.7 kg).   NOTE: UNCONTROLLED IDDM (NO STEROIDS)  ABNORMAL UDS (09/03/2020) (+) for COCAINE   Planned  Pending:   Therapeutic left IA Monovisc knee injection #1    Under consideration:   Possible bilateral lumbar facet RFA   Diagnostic bilateral genicular NB    Completed:   Diagnostic right lumbar facet MBB x3 (no steroid) (03/26/2021) (100/100/50/50)  Diagnostic left lumbar facet MBB x1 (w/ steroid) (10/09/2020) (100/100/90/L-100; R-50)  Therapeutic left L5 TFESI x4 (09/21/2018) (100/100/50/70)  Therapeutic left L4-5 LESI x4 (09/21/2018) (100/100/50/70)  Therapeutic left L5-S1 LESI x2 (02/25/2018) (100/100/100/100)  Therapeutic bilateral Hyalgan knee inj. x5 (02/25/2018) (100/100/100 x2 weeks/<50)  Therapeutic left IA steroid knee injection x1 (11/23/2017) (100/100/0/0)    Therapeutic  Palliative (PRN) options:   Palliative L5 TFESI  Palliative L4-5 LESI   Palliative L5-S1 LESI   Palliative Hyalgan knee inj.        Recent Visits Date Type Provider Dept  12/10/21 Procedure visit Milinda Pointer, MD [facility]  12/03/21 Office Visit Milinda Pointer, MD [facility]  11/12/21 Procedure visit Milinda Pointer, MD [facility]  10/30/21 Office Visit Milinda Pointer, MD [facility]  Showing recent visits within past 90 days and meeting all other requirements Future Appointments Date Type Provider Dept  12/24/21 Appointment Milinda Pointer, MD [facility]  Showing future appointments within next 90 days and meeting all other requirements   Disposition: Discharge home  Discharge (Date  Time): 12/24/2021;   hrs.   Primary Care Physician: Tracie Harrier, MD Location: [facility] Note by: Gaspar Cola, MD Date: 12/24/2021; Time: 4:56 PM  DISCLAIMER: Medicine is not an Chief Strategy Officer. It has no guarantees or warranties. The decision to proceed with this intervention was based on the information collected from the patient. Conclusions were drawn from the patient's questionnaire, interview, and examination. Because information was provided in large part by the patient, it cannot be guaranteed that it has not been  purposely or unconsciously manipulated or altered. Every effort has been made to obtain as much accurate, relevant, available data as possible. Always take into account that  the treatment will also be dependent on availability of resources and existing treatment guidelines, considered by other Pain Management Specialists as being common knowledge and practice, at the time of the intervention. It is also important to point out that variation in procedural techniques and pharmacological choices are the acceptable norm. For Medico-Legal review purposes, the indications, contraindications, technique, and results of the these procedures should only be evaluated, judged and interpreted by a Board-Certified Interventional Pain Specialist with extensive familiarity and expertise in the same exact procedure and technique.

## 2021-12-24 ENCOUNTER — Ambulatory Visit (HOSPITAL_BASED_OUTPATIENT_CLINIC_OR_DEPARTMENT_OTHER): Payer: Medicare Other | Admitting: Pain Medicine

## 2021-12-24 DIAGNOSIS — Z91199 Patient's noncompliance with other medical treatment and regimen due to unspecified reason: Secondary | ICD-10-CM

## 2021-12-24 DIAGNOSIS — G8929 Other chronic pain: Secondary | ICD-10-CM

## 2021-12-24 DIAGNOSIS — M1712 Unilateral primary osteoarthritis, left knee: Secondary | ICD-10-CM

## 2021-12-24 NOTE — Patient Instructions (Signed)
____________________________________________________________________________________________  Post-Procedure Discharge Instructions  Instructions: Apply ice:  Purpose: This will minimize any swelling and discomfort after procedure.  When: Day of procedure, as soon as you get home. How: Fill a plastic sandwich bag with crushed ice. Cover it with a small towel and apply to injection site. How long: (15 min on, 15 min off) Apply for 15 minutes then remove x 15 minutes.  Repeat sequence on day of procedure, until you go to bed. Apply heat:  Purpose: To treat any soreness and discomfort from the procedure. When: Starting the next day after the procedure. How: Apply heat to procedure site starting the day following the procedure. How long: May continue to repeat daily, until discomfort goes away. Food intake: Start with clear liquids (like water) and advance to regular food, as tolerated.  Physical activities: Keep activities to a minimum for the first 8 hours after the procedure. After that, then as tolerated. Driving: If you have received any sedation, be responsible and do not drive. You are not allowed to drive for 24 hours after having sedation. Blood thinner: (Applies only to those taking blood thinners) You may restart your blood thinner 6 hours after your procedure. Insulin: (Applies only to Diabetic patients taking insulin) As soon as you can eat, you may resume your normal dosing schedule. Infection prevention: Keep procedure site clean and dry. Shower daily and clean area with soap and water. Post-procedure Pain Diary: Extremely important that this be done correctly and accurately. Recorded information will be used to determine the next step in treatment. For the purpose of accuracy, follow these rules: Evaluate only the area treated. Do not report or include pain from an untreated area. For the purpose of this evaluation, ignore all other areas of pain, except for the treated  area. After your procedure, avoid taking a long nap and attempting to complete the pain diary after you wake up. Instead, set your alarm clock to go off every hour, on the hour, for the initial 8 hours after the procedure. Document the duration of the numbing medicine, and the relief you are getting from it. Do not go to sleep and attempt to complete it later. It will not be accurate. If you received sedation, it is likely that you were given a medication that may cause amnesia. Because of this, completing the diary at a later time may cause the information to be inaccurate. This information is needed to plan your care. Follow-up appointment: Keep your post-procedure follow-up evaluation appointment after the procedure (usually 2 weeks for most procedures, 6 weeks for radiofrequencies). DO NOT FORGET to bring you pain diary with you.   Expect: (What should I expect to see with my procedure?) From numbing medicine (AKA: Local Anesthetics): Numbness or decrease in pain. You may also experience some weakness, which if present, could last for the duration of the local anesthetic. Onset: Full effect within 15 minutes of injected. Duration: It will depend on the type of local anesthetic used. On the average, 1 to 8 hours.  From procedure: Some discomfort is to be expected once the numbing medicine wears off. This should be minimal if ice and heat are applied as instructed.  Call if: (When should I call?) You experience numbness and weakness that gets worse with time, as opposed to wearing off. New onset bowel or bladder incontinence. (Applies only to procedures done in the spine)  Emergency Numbers: Durning business hours (Monday - Thursday, 8:00 AM - 4:00 PM) (Friday, 9:00 AM -  12:00 Noon): [phone] After hours: [phone] NOTE: If you are having a problem and are unable connect with, or to talk to a provider, then go to your nearest urgent care or emergency department. If the problem is  serious and urgent, please call 911. ____________________________________________________________________________________________

## 2021-12-31 ENCOUNTER — Encounter: Payer: Self-pay | Admitting: Pain Medicine

## 2021-12-31 ENCOUNTER — Ambulatory Visit: Payer: Medicare Other | Attending: Pain Medicine | Admitting: Pain Medicine

## 2021-12-31 VITALS — BP 146/80 | HR 88 | Temp 98.2°F | Ht 59.0 in | Wt 145.0 lb

## 2021-12-31 DIAGNOSIS — M1712 Unilateral primary osteoarthritis, left knee: Secondary | ICD-10-CM | POA: Diagnosis present

## 2021-12-31 DIAGNOSIS — M25562 Pain in left knee: Secondary | ICD-10-CM

## 2021-12-31 DIAGNOSIS — G8929 Other chronic pain: Secondary | ICD-10-CM

## 2021-12-31 MED ORDER — ROPIVACAINE HCL 2 MG/ML IJ SOLN
INTRAMUSCULAR | Status: AC
  Filled 2021-12-31: qty 20

## 2021-12-31 MED ORDER — PENTAFLUOROPROP-TETRAFLUOROETH EX AERO
INHALATION_SPRAY | Freq: Once | CUTANEOUS | Status: AC
  Administered 2021-12-31: 30 via TOPICAL

## 2021-12-31 MED ORDER — ROPIVACAINE HCL 2 MG/ML IJ SOLN
5.0000 mL | Freq: Once | INTRAMUSCULAR | Status: AC
  Administered 2021-12-31: 20 mL via INTRA_ARTICULAR

## 2021-12-31 MED ORDER — SODIUM HYALURONATE (VISCOSUP) 16.8 MG/2ML IX SOSY
16.8000 mg | PREFILLED_SYRINGE | Freq: Once | INTRA_ARTICULAR | Status: AC
  Administered 2021-12-31: 16.8 mg via INTRA_ARTICULAR
  Filled 2021-12-31: qty 2

## 2021-12-31 MED ORDER — LIDOCAINE HCL (PF) 1 % IJ SOLN
INTRAMUSCULAR | Status: AC
  Filled 2021-12-31: qty 5

## 2021-12-31 MED ORDER — LIDOCAINE HCL 2 % IJ SOLN: 20.0000 mL | Freq: Once | INTRAMUSCULAR | Status: DC

## 2021-12-31 MED ORDER — LIDOCAINE HCL (PF) 1 % IJ SOLN
5.0000 mL | Freq: Once | INTRAMUSCULAR | Status: AC
  Administered 2021-12-31: 5 mL

## 2021-12-31 NOTE — Patient Instructions (Addendum)
Post-procedure Information What to expect: Most procedures involve the use of a local anesthetic (numbing medicine), and a steroid (anti-inflammatory medicine).  The local anesthetics may cause temporary numbness and weakness of the legs or arms, depending on the location of the block. This numbness/weakness may last 4-6 hours, depending on the local anesthetic used. In rare instances, it can last up to 24 hours. While numb, you must be very careful not to injure the extremity.  After any procedure, you could expect the pain to get better within 15-20 minutes. This relief is temporary and may last 4-6 hours. Once the local anesthetics wears off, you could experience discomfort, possibly more than usual, for up to 10 (ten) days. In the case of radiofrequencies, it may last up to 6 weeks. Surgeries may take up to 8 weeks for the healing process. The discomfort is due to the irritation caused by needles going through skin and muscle. To minimize the discomfort, we recommend using ice the first day, and heat from then on. The ice should be applied for 15 minutes on, and 15 minutes off. Keep repeating this cycle until bedtime. Avoid applying the ice directly to the skin, to prevent frostbite. Heat should be used daily, until the pain improves (4-10 days). Be careful not to burn yourself.  Occasionally you may experience muscle spasms or cramps. These occur as a consequence of the irritation caused by the needle sticks to the muscle and the blood that will inevitably be lost into the surrounding muscle tissue. Blood tends to be very irritating to tissues, which tend to react by going into spasm. These spasms may start the same day of your procedure, but they may also take days to develop. This late onset type of spasm or cramp is usually caused by electrolyte imbalances triggered by the steroids, at the level of the kidney. Cramps and spasms tend to respond well to muscle relaxants, multivitamins (some are  triggered by the procedure, but may have their origins in vitamin deficiencies), and "Gatorade", or any sports drinks that can replenish any electrolyte imbalances. (If you are a diabetic, ask your pharmacist to get you a sugar-free brand.) Warm showers or baths may also be helpful. Stretching exercises are highly recommended. General Instructions:  Be alert for signs of possible infection: redness, swelling, heat, red streaks, elevated temperature, and/or fever. These typically appear 4 to 6 days after the procedure. Immediately notify your doctor if you experience unusual bleeding, difficulty breathing, or loss of bowel or bladder control. If you experience increased pain, do not increase your pain medicine intake, unless instructed by your pain physician. Post-Procedure Care:  Be careful in moving about. Muscle spasms in the area of the injection may occur. Applying ice or heat to the area is often helpful. The incidence of spinal headaches after epidural injections ranges between 1.4% and 6%. If you develop a headache that does not seem to respond to conservative therapy, please let your physician know. This can be treated with an epidural blood patch.   Post-procedure numbness or redness is to be expected, however it should average 4 to 6 hours. If numbness and weakness of your extremities begins to develop 4 to 6 hours after your procedure, and is felt to be progressing and worsening, immediately contact your physician.   Diet:  If you experience nausea, do not eat until this sensation goes away. If you had a "Stellate Ganglion Block" for upper extremity "Reflex Sympathetic Dystrophy", do not eat or drink until your   hoarseness goes away. In any case, always start with liquids first and if you tolerate them well, then slowly progress to more solid foods. Activity:  For the first 4 to 6 hours after the procedure, use caution in moving about as you may experience numbness and/or weakness. Use caution in  cooking, using household electrical appliances, and climbing steps. If you need to reach your Doctor call our office: [phone] Monday-Thursday 8:00 am - 4:00 PM    Fridays: Closed     In case of an emergency: In case of emergency, call 911 or go to the nearest emergency room and have the physician there call us.  Interpretation of Procedure Every nerve block has two components: a diagnostic component, and a treatment component. Unrealistic expectations are the most common causes of "perceived failure".  In a perfect world, a single nerve block should be able to completely and permanently eliminate the pain. Sadly, the world is not perfect.  Most pain management nerve blocks are performed using local anesthetics and steroids. Steroids are responsible for any long-term benefit that you may experience. Their purpose is to decrease any chronic swelling that may exist in the area. Steroids begin to work immediately after being injected. However, most patients will not experience any benefits until 5 to 10 days after the injection, when the swelling has come down to the point where they can tell a difference. Steroids will only help if there is swelling to be treated. As such, they can assist with the diagnosis. If effective, they suggest an inflammatory component to the pain, and if ineffective, they rule out inflammation as the main cause or component of the problem. If the problem is one of mechanical compression, you will get no benefit from those steroids.   In the case of local anesthetics, they have a crucial role in the diagnosis of your condition. Most will begin to work within15 to 20 minutes after injection. The duration will depend on the type used (short- vs. Long-acting). It is of outmost importance that patients keep tract of their pain, after the procedure. To assist with this matter, a "Post-procedure Pain Diary" is provided. Make sure to complete it and to bring it back to your  follow-up appointment.  As long as the patient keeps accurate, detailed records of their symptoms after every procedure, and returns to have those interpreted, every procedure will provide us with invaluable information. Even a block that does not provide the patient with any relief, will always provide us with information about the mechanism and the origin of the pain. The only time a nerve block can be considered a waste of time is when patients do not keep track of the results, or do not keep their post-procedure appointment.  Reporting the results back to your physician The Pain Score  Pain is a subjective complaint. It cannot be seen, touched, or measured. We depend entirely on the patient's report of the pain in order to assess your condition and treatment. To evaluate the pain, we use a pain scale, where "0" means "No Pain", and a "10" is "the worst possible pain that you can even imagine" (i.e. something like been eaten alive by a shark or being torn apart by a lion).   You will frequently be asked to rate your pain. Please be as accurate, remember that medical decisions will be based on your responses. Please do not rate your pain above a 10. Doing so is actually interpreted as "symptom magnification" (exaggeration), as   well as lack of understanding with regards to the scale. To put this into perspective, when you tell us that your pain is at a 10 (ten), what you are saying is that there is nothing we can do to make this pain any worse. (Carefully think about that.)  ____________________________________________________________________________________________  Virtual Visits   ID our calls: Add these numbers to your list of contacts on your smart phone. Label it as "PAIN Management" Nursing: [phone] (Main) Dr. Dossie Arbour: [phone]   What is a "Virtual Visit"? It is an Automotive engineer (medical visit) that takes place on real time (NOT TEXT or E-MAIL) over the  telephone or computer device (desktop, laptop, tablet, smart phone, etc.). It allows for more communication flexibility between the patient and the healthcare provider.  Who decides when these types of visits will be used? The physician.  Who is eligible for these types of visits? Only those patients that can be reliably reached over the telephone.  What do you mean by reliably? We do not have time to call everyone multiple times, therefore those patients that tend to screen calls and then call back later are not suitable candidates for this system. We all hate telemarketers and "Robocalls".  We understand how people are reluctant to pickup on calls from "unknown numbers", therefore, we suggest you add our numbers to your list of contacts. This way, you should be able to identify our calls. All of our numbers are available above.   Who is not eligible? This option is not appropriate for medication management. Patients on controlled substances have to come in for "Face-to-Face" encounters. Monitoring of these substances is mandatory. Virtual visits do not allow for unannounced drug screening tests or pill counts. Not bringing your pills, or the empty bottles, may result in no refill.  When will this type of visits be used? For follow-up after procedures on established patients, as long as they have had the same procedure done before.  Whenever you are physically unable to attend a regular appointment.   Can I request my medication visit to be "Virtual"? Yes. Available on a limited basis, only if you are unable to physically attend your appointment. However, you may only receive a 30-day prescription. Abuse of this option may result in discontinuation of medication due to inability to properly monitor the therapy.  When will I be called?  You will receive an initial call from [phone], from our nursing staff, one business day prior to your appointment. (For Monday appointments you will be  called on Friday.) The purpose of this call is to review your medications and the results of any recent procedure.  If the nursing staff is unable to make contact, your virtual encounter may be canceled and rescheduled to a face-to-face visit.  Your provider will call you on the day of your appointment.  At what time will I be called? Providers will call whenever there is time available. Do not expect calls at any specific time. On the schedule, you will have an appointment time assigned to you however, this is seldom accurate. This is done simply to keep a list of patients to be called. Be advised that calls may come at anytime during the day. Calls start as early as 8:00 AM and go as late as 8:00 PM. This will depend on provider availability. The system is not perfect. If this is inconvenient for you, please request to be changed to an "in-person" appointment.   ____________________________________________________________________________________________  ____________________________________________________________________________________________  Post-Procedure Discharge Instructions  Instructions: Apply ice:  Purpose: This will minimize any swelling and discomfort after procedure.  When: Day of procedure, as soon as you get home. How: Fill a plastic sandwich bag with crushed ice. Cover it with a small towel and apply to injection site. How long: (15 min on, 15 min off) Apply for 15 minutes then remove x 15 minutes.  Repeat sequence on day of procedure, until you go to bed. Apply heat:  Purpose: To treat any soreness and discomfort from the procedure. When: Starting the next day after the procedure. How: Apply heat to procedure site starting the day following the procedure. How long: May continue to repeat daily, until discomfort goes away. Food intake: Start with clear liquids (like water) and advance to regular food, as tolerated.  Physical activities: Keep activities to a minimum for  the first 8 hours after the procedure. After that, then as tolerated. Driving: If you have received any sedation, be responsible and do not drive. You are not allowed to drive for 24 hours after having sedation. Blood thinner: (Applies only to those taking blood thinners) You may restart your blood thinner 6 hours after your procedure. Insulin: (Applies only to Diabetic patients taking insulin) As soon as you can eat, you may resume your normal dosing schedule. Infection prevention: Keep procedure site clean and dry. Shower daily and clean area with soap and water. Post-procedure Pain Diary: Extremely important that this be done correctly and accurately. Recorded information will be used to determine the next step in treatment. For the purpose of accuracy, follow these rules: Evaluate only the area treated. Do not report or include pain from an untreated area. For the purpose of this evaluation, ignore all other areas of pain, except for the treated area. After your procedure, avoid taking a long nap and attempting to complete the pain diary after you wake up. Instead, set your alarm clock to go off every hour, on the hour, for the initial 8 hours after the procedure. Document the duration of the numbing medicine, and the relief you are getting from it. Do not go to sleep and attempt to complete it later. It will not be accurate. If you received sedation, it is likely that you were given a medication that may cause amnesia. Because of this, completing the diary at a later time may cause the information to be inaccurate. This information is needed to plan your care. Follow-up appointment: Keep your post-procedure follow-up evaluation appointment after the procedure (usually 2 weeks for most procedures, 6 weeks for radiofrequencies). DO NOT FORGET to bring you pain diary with you.   Expect: (What should I expect to see with my procedure?) From numbing medicine (AKA: Local Anesthetics): Numbness or decrease  in pain. You may also experience some weakness, which if present, could last for the duration of the local anesthetic. Onset: Full effect within 15 minutes of injected. Duration: It will depend on the type of local anesthetic used. On the average, 1 to 8 hours.   From procedure: Some discomfort is to be expected once the numbing medicine wears off. This should be minimal if ice and heat are applied as instructed.  Call if: (When should I call?) You experience numbness and weakness that gets worse with time, as opposed to wearing off. New onset bowel or bladder incontinence. (Applies only to procedures done in the spine)  Emergency Numbers: Durning business hours (Monday - Thursday, 8:00 AM - 4:00 PM) (Friday, 9:00  AM - 12:00 Noon): [phone] After hours: [phone] NOTE: If you are having a problem and are unable connect with, or to talk to a provider, then go to your nearest urgent care or emergency department. If the problem is serious and urgent, please call 911. ____________________________________________________________________________________________

## 2021-12-31 NOTE — Progress Notes (Signed)
Safety precautions to be maintained throughout the outpatient stay will include: orient to surroundings, keep bed in low position, maintain call bell within reach at all times, provide assistance with transfer out of bed and ambulation.  

## 2021-12-31 NOTE — Progress Notes (Signed)
PROVIDER NOTE: Interpretation of information contained herein should be left to medically-trained personnel. Specific patient instructions are provided elsewhere under "Patient Instructions" section of medical record. This document was created in part using STT-dictation technology, any transcriptional errors that may result from this process are unintentional.  Patient: Charlene [name] Type: Established DOB: Sep 08, 1960 MRN: [id number] PCP: Charlene Harrier, MD  Service: Procedure DOS: 12/31/2021 Setting: Ambulatory Location: Ambulatory outpatient facility Delivery: Face-to-face Provider: Gaspar Cola, MD Specialty: Interventional Pain Management Specialty designation: 09 Location: Outpatient facility Ref. Prov.: Charlene Harrier, MD    Primary Reason for Visit: Interventional Pain Management Treatment. CC: Knee Pain (left)   Procedure:           Type: Gelsyn-3 Intra-articular Knee Injection #2  Laterality: Left (-LT) Level/approach: Lateral Imaging guidance: None required ([id number]) Anesthesia: Local anesthesia (1-2% Lidocaine) Anxiolysis: None                 Sedation: No Sedation                       DOS: 12/31/2021  Performed by: Charlene Cola, MD  Purpose: Diagnostic/Therapeutic Indications: Knee arthralgia associated to osteoarthritis of the knee 1. Chronic knee pain (Left)   2. Osteoarthritis of knee (Left)    NAS-11 score:   Pre-procedure: 0-No pain/10   Post-procedure: 0-No pain/10     Post-procedure evaluation   Type: Gelsyn-3 Intra-articular Knee Injection #1  Laterality: Left (-LT) Level/approach: Lateral Imaging guidance: None required ([id number]) Anesthesia: Local anesthesia (1-2% Lidocaine) Anxiolysis: None                 Sedation: No Sedation                       DOS: 12/10/2021  Performed by: Charlene Cola, MD  Purpose: Diagnostic/Therapeutic Indications: Knee arthralgia associated to osteoarthritis of the knee 1. Chronic  knee pain (Left)   2. Osteoarthritis of knee (Left)   3. Chronic knee pain (4th area of Pain) (Bilateral) (L>R)   4. Osteoarthritis of knee (Bilateral)   5. Uncontrolled type 2 diabetes mellitus with hyperglycemia (HCC)    NAS-11 score:   Pre-procedure: 1 /10   Post-procedure: 1 /10     Effectiveness:  Initial hour after procedure: 100 %. Subsequent 4-6 hours post-procedure: 100 %. Analgesia past initial 6 hours: 100 %. Ongoing improvement:  Analgesic: The patient indicates having attained an ongoing 100% relief of her left knee pain. Function: [name] reports improvement in function ROM: [name] reports improvement in ROM  Pre-Procedure Preparation  Monitoring: As per clinic protocol.  Risk Assessment: Vitals:  [id number] body mass index is 29.29 kg/m as calculated from the following:   Height as of this encounter: '4\' 11"'$  (1.499 m).   Weight as of this encounter: 145 lb (65.8 kg)., Rate:88 , BP:(!) 146/80, Resp: , Temp:98.2 F (36.8 C), SpO2:100 %  Allergies: She is allergic to semaglutide.  Precautions: No additional precautions required  Blood-thinner(s): None at this time  Coagulopathies: Reviewed. None identified.   Active Infection(s): Reviewed. None identified. Ms. Asa is afebrile   Location setting: Exam room Position: Sitting w/ knee bent 90 degrees Safety Precautions: Patient was assessed for positional comfort and pressure points before starting the procedure. Prepping solution: DuraPrep (Iodine Povacrylex [0.7% available iodine] and Isopropyl Alcohol, 74% w/w) Prep Area: Entire knee region Approach: percutaneous, just above the tibial plateau, lateral to the infrapatellar tendon. Intended target: Intra-articular  knee space Materials: Tray: Block Needle(s): Regular Qty: 1/side Length: 1.5-inch Gauge: 25G   Meds ordered this encounter  Medications   lidocaine (XYLOCAINE) 2 % (with pres) injection 400 mg   pentafluoroprop-tetrafluoroeth  (GEBAUERS) aerosol   lidocaine (PF) (XYLOCAINE) 1 % injection 5 mL   ropivacaine (PF) 2 mg/mL (0.2%) (NAROPIN) injection 5 mL   sodium hyaluronate (viscosup) (GELSYN-3) intra-articular injection 16.8 mg    Do not substitute. Deliver to facility day before procedure.    Orders Placed This Encounter  Procedures   KNEE INJECTION    Indications: Knee arthralgia (pain) due to osteoarthritis (OA) Imaging: None ([id number]) Position: Sitting Equipment/Materials: Block tray  1.5", 25-G (one per side)  Local anesthetic  Monovisc (one per side) Confirm availability (in office) of Monovisc (HMW hyaluronan)    Scheduling Instructions:     Procedure: Knee injection Monovisc (Hyaluronan/Hyaluronic acid)     Treatment No.: 2     Level: Intra-articular     Laterality: Left Knee     Sedation: Patient's choice.    Order Specific Question:   Where will this procedure be performed?    Answer:   [facility]   Informed Consent Details: Physician/Practitioner Attestation; Transcribe to consent form and obtain patient signature    Nursing Order: Transcribe to consent form and obtain patient signature. Note: Always confirm laterality of pain with [name], before procedure.    Order Specific Question:   Physician/Practitioner attestation of informed consent for procedure/surgical case    Answer:   I, the physician/practitioner, attest that I have discussed with the patient the benefits, risks, side effects, alternatives, likelihood of achieving goals and potential problems during recovery for the procedure that I have provided informed consent.    Order Specific Question:   Procedure    Answer:   Therapeutic intra-articular viscosupplementation knee injection    Order Specific Question:   Physician/Practitioner performing the procedure    Answer:   Charlene Crossin A. Dossie Arbour, MD    Order Specific Question:   Indication/Reason    Answer:   Chronic knee pain secondary to primary osteoarthritis of the knee   (Right-M17.11), (Left-M17.12) or (Bilateral-M17.0)   Provide equipment / supplies at bedside    Procedure tray: "Block Tray" (Disposable  single use) Skin infiltration needle: Regular 1.5-in, 25-G, (x1) Block Needle type: Regular Amount/quantity: 1 Size: Short(1.5-inch) Gauge: (25G x1) + (22G x1)    Standing Status:   Standing    Number of Occurrences:   1    Order Specific Question:   Specify    Answer:   Block Tray     Time-out: 2330 I initiated and conducted the "Time-out" before starting the procedure, as per protocol. The patient was asked to participate by confirming the accuracy of the "Time Out" information. Verification of the correct person, site, and procedure were performed and confirmed by me, the nursing staff, and the patient. "Time-out" conducted as per Joint Commission's Universal Protocol (UP.01.01.01). Procedure checklist: Completed   H&P (Pre-op  Assessment)  Ms. Kibler is a 61 y.o. (year old), female patient, seen today for interventional treatment. She  has a past surgical history that includes arthroscopic rotator cuff; Colonoscopy; Abdominal hysterectomy; endosopic sinus; and Cataract extraction w/PHACO (Right, 11/25/2017). Ms. Mancebo has a current medication list which includes the following prescription(s): amlodipine, carvedilol, cholecalciferol, freestyle libre sensor system, dicyclomine, duloxetine, hydralazine, toujeo max solostar, insulin lispro, lisinopril, metformin, pantoprazole, polyethylene glycol, pravastatin, pregabalin, sucralfate, tramadol, amoxicillin, baclofen, and magnesium, and the following Facility-Administered Medications: lidocaine  and sodium hyaluronate (viscosup). Her primarily concern today is the Knee Pain (left)  She is allergic to semaglutide.   Last encounter: My last encounter with her was on 12/24/2021. Pertinent problems: Ms. 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[place]); Chronic ankle pain (Left); Chronic foot pain (Left); Chronic lower extremity pain (2ry area of Pain) (Bilateral) (L>R); Chronic knee pain (4th area of Pain) (Bilateral) (L>R); Chronic low back pain (3ry area of Pain) (Bilateral) (L>R) w/ sciatica (Left); Chronic pain syndrome; DM type 2 with diabetic peripheral neuropathy (HCC); DDD (degenerative disc disease), lumbar; Lumbar facet arthropathy; Lumbar facet syndrome; Lumbar facet hypertrophy; Lumbar foraminal stenosis (L5-S1) (Right); Osteoarthritis of knee (Bilateral); Chronic musculoskeletal pain; Abnormal EMG (07/06/2017); Osteoarthritis involving multiple joints; 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[place]); Knee pain; Back pain with left-sided sciatica; Meningioma ([place]); Neck pain; Chronic pain disorder; Left leg weakness; Meralgia paraesthetica, left; Pain in both lower extremities; Arthropathy of lumbar facet joint; Chronic ankle and foot pain (1ry area of Pain) (Left); 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 Pain Assessment: Severity of Chronic pain is reported as a 0-No pain/10. Location: Knee Left/denies. Onset: More than a month ago. Quality: Aching, Constant, Dull. Timing: Constant. Modifying factor(s): meds, rest. Vitals:  height is '4\' 11"'$  (1.499 m) and weight is 145 lb (65.8 kg). Her temporal temperature is 98.2 F (36.8 C). Her blood pressure is 146/80 (abnormal) and her  pulse is 88. Her oxygen saturation is 100%.   Reason for encounter: "interventional pain management therapy due pain of at least four (4) weeks in duration, with failure to respond and/or inability to tolerate more conservative care.   Site Confirmation: Ms. Wiederhold was asked to confirm the procedure and laterality before marking the site.  Consent: Before the procedure and under the influence of no sedative(s), amnesic(s), or anxiolytics, the patient was informed of the treatment options, risks and possible complications. To fulfill our ethical and legal obligations, as recommended by the American Medical Association's Code of Ethics, I have informed the patient of my clinical impression; the nature and purpose of the treatment or procedure; the risks, benefits, and possible complications of the intervention; the alternatives, including doing nothing; the risk(s) and benefit(s) of the alternative treatment(s) or procedure(s); and the risk(s) and benefit(s) of doing nothing. The patient was provided information about the general risks and possible complications associated with the procedure. These may include, but are not limited to: failure to achieve desired goals, infection, bleeding, organ or nerve damage, allergic reactions, paralysis, and death. In addition, the patient was informed of those risks and complications associated to Spine-related procedures, such as failure to decrease pain; infection (i.e.: Meningitis, epidural or intraspinal abscess); bleeding (i.e.: epidural hematoma, subarachnoid hemorrhage, or any other type of intraspinal or peri-dural bleeding); organ or nerve damage (i.e.: Any type of peripheral nerve, nerve root, or spinal cord injury) with subsequent damage to sensory, motor, and/or autonomic systems, resulting in permanent pain, numbness, and/or weakness of one or several areas of the body; allergic reactions; (  i.e.: anaphylactic reaction); and/or death. Furthermore, the patient  was informed of those risks and complications associated with the medications. These include, but are not limited to: allergic reactions (i.e.: anaphylactic or anaphylactoid reaction(s)); adrenal axis suppression; blood sugar elevation that in diabetics may result in ketoacidosis or comma; water retention that in patients with history of congestive heart failure may result in shortness of breath, pulmonary edema, and decompensation with resultant heart failure; weight gain; swelling or edema; medication-induced neural toxicity; particulate matter embolism and blood vessel occlusion with resultant organ, and/or nervous system infarction; and/or aseptic necrosis of one or more joints. Finally, the patient was informed that Medicine is not an exact science; therefore, there is also the possibility of unforeseen or unpredictable risks and/or possible complications that may result in a catastrophic outcome. The patient indicated having understood very clearly. We have given the patient no guarantees and we have made no promises. Enough time was given to the patient to ask questions, all of which were answered to the patient's satisfaction. Ms. Burlison has indicated that she wanted to continue with the procedure. Attestation: I, the ordering provider, attest that I have discussed with the patient the benefits, risks, side-effects, alternatives, likelihood of achieving goals, and potential problems during recovery for the procedure that I have provided informed consent.  Date  Time: 12/31/2021 10:48 AM   Prophylactic antibiotics  Anti-infectives (From admission, onward)    None      Indication(s): None identified   Description of procedure   Start Time: 1103 hrs  Local Anesthesia: Once the patient was positioned, prepped, and time-out was completed. The target area was identified located. The skin was marked with an approved surgical skin marker. Once marked, the skin (epidermis, dermis, and hypodermis),  and deeper tissues (fat, connective tissue and muscle) were infiltrated with a small amount of a short-acting local anesthetic, loaded on a 10cc syringe with a 25G, 1.5-in  Needle. An appropriate amount of time was allowed for local anesthetics to take effect before proceeding to the next step. Local Anesthetic: Lidocaine 1-2% The unused portion of the local anesthetic was discarded in the proper designated containers. Safety Precautions: Aspiration looking for blood return was conducted prior to all injections. At no point did I inject any substances, as a needle was being advanced. Before injecting, the patient was told to immediately notify me if she was experiencing any new onset of "ringing in the ears, or metallic taste in the mouth". No attempts were made at seeking any paresthesias. Safe injection practices and needle disposal techniques used. Medications properly checked for expiration dates. SDV (single dose vial) medications used. After the completion of the procedure, all disposable equipment used was discarded in the proper designated medical waste containers.  Technical description: Protocol guidelines were followed. After positioning, the target area was identified and prepped in the usual manner. Skin & deeper tissues infiltrated with local anesthetic. Appropriate amount of time allowed to pass for local anesthetics to take effect. Proper needle placement secured. Once satisfactory needle placement was confirmed, I proceeded to inject the desired solution in slow, incremental fashion, intermittently assessing for discomfort or any signs of abnormal or undesired spread of substance. Once completed, the needle was removed and disposed of, as per hospital protocols. The area was cleaned, making sure to leave some of the prepping solution back to take advantage of its long term bactericidal properties.  Aspiration:  Negative          Vitals:   12/31/21 1048  BP: (!) 146/80  Pulse: 88   Temp: 98.2 F (36.8 C)  TempSrc: Temporal  SpO2: 100%  Weight: 145 lb (65.8 kg)  Height: '4\' 11"'$  (1.499 m)    End Time: 1105 hrs   Imaging guidance  Imaging-assisted Technique: None required. Indication(s): N/A Exposure Time: N/A Contrast: None Fluoroscopic Guidance: N/A Ultrasound Guidance: N/A Interpretation: N/A   Post-op assessment  Post-procedure Vital Signs:  Pulse/HCG Rate: 88  Temp: 98.2 F (36.8 C) Resp:   BP: (!) 146/80 SpO2: 100 %  EBL: None  Complications: No immediate post-treatment complications observed by team, or reported by patient.  Note: The patient tolerated the entire procedure well. A repeat set of vitals were taken after the procedure and the patient was kept under observation following institutional policy, for this type of procedure. Post-procedural neurological assessment was performed, showing return to baseline, prior to discharge. The patient was provided with post-procedure discharge instructions, including a section on how to identify potential problems. Should any problems arise concerning this procedure, the patient was given instructions to immediately contact us, at any time, without hesitation. In any case, we plan to contact the patient by telephone for a follow-up status report regarding this interventional procedure.  Comments:  No additional relevant information.   Plan of care  Chronic Opioid Analgesic:  Tramadol 50 mg, 1 tab PO QD. ABNORMAL UDS (09/03/2020) (+) for COCAINE. MME: 5 mg/day   Medications administered: We administered pentafluoroprop-tetrafluoroeth, lidocaine (PF), and ropivacaine (PF) 2 mg/mL (0.2%).  Follow-up plan:   Return in about 2 weeks (around 01/14/2022) for Proc-day (T,Th), (VV), (PPE).      Interventional Therapies  Risk  Complexity Considerations:   Estimated body mass index is 31.91 kg/m as calculated from the following:   Height as of 12/27/20: '4\' 11"'$  (1.499 m).   Weight as of 12/27/20: 158 lb  (71.7 kg).   NOTE: UNCONTROLLED IDDM (NO STEROIDS)  ABNORMAL UDS (09/03/2020) (+) for COCAINE   Planned  Pending:   Therapeutic left IA Monovisc knee injection #1    Under consideration:   Possible bilateral lumbar facet RFA  Diagnostic bilateral genicular NB    Completed:   Diagnostic right lumbar facet MBB x3 (no steroid) (03/26/2021) (100/100/50/50)  Diagnostic left lumbar facet MBB x1 (w/ steroid) (10/09/2020) (100/100/90/L-100; R-50)  Therapeutic left L5 TFESI x4 (09/21/2018) (100/100/50/70)  Therapeutic left L4-5 LESI x4 (09/21/2018) (100/100/50/70)  Therapeutic left L5-S1 LESI x2 (02/25/2018) (100/100/100/100)  Therapeutic bilateral Hyalgan knee inj. x5 (02/25/2018) (100/100/100 x2 weeks/<50)  Therapeutic left IA steroid knee injection x1 (11/23/2017) (100/100/0/0)    Therapeutic  Palliative (PRN) options:   Palliative L5 TFESI  Palliative L4-5 LESI   Palliative L5-S1 LESI   Palliative Hyalgan knee inj.        Recent Visits Date Type Provider Dept  12/10/21 Procedure visit Milinda Pointer, MD [facility]  12/03/21 Office Visit Milinda Pointer, MD [facility]  11/12/21 Procedure visit Milinda Pointer, MD [facility]  10/30/21 Office Visit Milinda Pointer, MD [facility]  Showing recent visits within past 90 days and meeting all other requirements Today's Visits Date Type Provider Dept  12/31/21 Procedure visit Milinda Pointer, MD [facility]  Showing today's visits and meeting all other requirements Future Appointments No visits were found meeting these conditions. Showing future appointments within next 90 days and meeting all other requirements   Disposition: Discharge home  Discharge (Date  Time): 12/31/2021; 1110 hrs.   Primary Care Physician: Charlene Harrier, MD  Location: [facility] Note by: Charlene Cola, MD Date: 12/31/2021; Time: 11:10 AM  DISCLAIMER:  Medicine is not an Chief Strategy Officer. It has no guarantees or warranties. The decision to proceed with this intervention was based on the information collected from the patient. Conclusions were drawn from the patient's questionnaire, interview, and examination. Because information was provided in large part by the patient, it cannot be guaranteed that it has not been purposely or unconsciously manipulated or altered. Every effort has been made to obtain as much accurate, relevant, available data as possible. Always take into account that the treatment will also be dependent on availability of resources and existing treatment guidelines, considered by other Pain Management Specialists as being common knowledge and practice, at the time of the intervention. It is also important to point out that variation in procedural techniques and pharmacological choices are the acceptable norm. For Medico-Legal review purposes, the indications, contraindications, technique, and results of the these procedures should only be evaluated, judged and interpreted by a Board-Certified Interventional Pain Specialist with extensive familiarity and expertise in the same exact procedure and technique.

## 2022-01-01 ENCOUNTER — Telehealth: Payer: Self-pay

## 2022-01-01 NOTE — Telephone Encounter (Signed)
Post procedure follow up.  Patient states she id doing well.

## 2022-01-02 ENCOUNTER — Other Ambulatory Visit: Payer: Self-pay

## 2022-01-02 ENCOUNTER — Emergency Department: Payer: Medicare Other

## 2022-01-02 ENCOUNTER — Emergency Department
Admission: EM | Admit: 2022-01-02 | Discharge: 2022-01-02 | Disposition: A | Discharge status: Home / Self Care | Payer: Medicare Other | Attending: Emergency Medicine | Admitting: Emergency Medicine

## 2022-01-02 DIAGNOSIS — Y92410 Unspecified street and highway as the place of occurrence of the external cause: Secondary | ICD-10-CM | POA: Diagnosis not present

## 2022-01-02 DIAGNOSIS — E119 Type 2 diabetes mellitus without complications: Secondary | ICD-10-CM | POA: Insufficient documentation

## 2022-01-02 DIAGNOSIS — M545 Low back pain, unspecified: Secondary | ICD-10-CM | POA: Diagnosis not present

## 2022-01-02 DIAGNOSIS — I10 Essential (primary) hypertension: Secondary | ICD-10-CM | POA: Diagnosis not present

## 2022-01-02 MED ORDER — LIDOCAINE 5 % EX PTCH
1.0000 | MEDICATED_PATCH | CUTANEOUS | Status: DC
  Administered 2022-01-02: 1 via TRANSDERMAL
  Filled 2022-01-02: qty 1

## 2022-01-02 MED ORDER — LIDOCAINE 5 % EX PTCH: 1.0000 | MEDICATED_PATCH | Freq: Two times a day (BID) | CUTANEOUS | 0 refills | Status: AC
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
## 2022-01-21 ENCOUNTER — Ambulatory Visit: Payer: Medicare Other | Attending: Pain Medicine | Admitting: Pain Medicine

## 2022-01-21 DIAGNOSIS — M1712 Unilateral primary osteoarthritis, left knee: Secondary | ICD-10-CM

## 2022-01-21 DIAGNOSIS — M5136 Other intervertebral disc degeneration, lumbar region: Secondary | ICD-10-CM

## 2022-01-21 DIAGNOSIS — G8929 Other chronic pain: Secondary | ICD-10-CM

## 2022-01-21 DIAGNOSIS — M5442 Lumbago with sciatica, left side: Secondary | ICD-10-CM | POA: Diagnosis not present

## 2022-01-21 DIAGNOSIS — M4316 Spondylolisthesis, lumbar region: Secondary | ICD-10-CM | POA: Diagnosis not present

## 2022-01-21 DIAGNOSIS — M79604 Pain in right leg: Secondary | ICD-10-CM

## 2022-01-21 DIAGNOSIS — M25562 Pain in left knee: Secondary | ICD-10-CM

## 2022-01-21 DIAGNOSIS — M79605 Pain in left leg: Secondary | ICD-10-CM

## 2022-01-21 NOTE — Patient Instructions (Signed)
______________________________________________________________________  Preparing for your procedure  During your procedure appointment there will be: No Prescription Refills. No disability issues to discussed. No medication changes or discussions.  Instructions: Food intake: Avoid eating anything solid for at least 8 hours prior to your procedure. Clear liquid intake: You may take clear liquids such as water up to 2 hours prior to your procedure. (No carbonated drinks. No soda.) Transportation: Unless otherwise stated by your physician, bring a driver. Morning Medicines: Except for blood thinners, take all of your other morning medications with a sip of water. Make sure to take your heart and blood pressure medicines. If your blood pressure's lower number is above 100, the case will be rescheduled. Blood thinners: If you take a blood thinner, but were not instructed to stop it, call our office [phone] and ask to talk to a nurse. Not stopping a blood thinner prior to certain procedures could lead to serious complications. Diabetics on insulin: Notify the staff so that you can be scheduled 1st case in the morning. If your diabetes requires high dose insulin, take only  of your normal insulin dose the morning of the procedure and notify the staff that you have done so. Preventing infections: Shower with an antibacterial soap the morning of your procedure.  Build-up your immune system: Take 1000 mg of Vitamin C with every meal (3 times a day) the day prior to your procedure. Antibiotics: Inform the nursing staff if you are taking any antibiotics or if you have any conditions that may require antibiotics prior to procedures. (Example: recent joint implants)   Pregnancy: If you are pregnant make sure to notify the nursing staff. Not doing so may result in injury to the fetus, including death.  Sickness: If you have a cold, fever, or any active infections, call and cancel or reschedule your  procedure. Receiving steroids while having an infection may result in complications. Arrival: You must be in the facility at least 30 minutes prior to your scheduled procedure. Tardiness: Your scheduled time is also the cutoff time. If you do not arrive at least 15 minutes prior to your procedure, you will be rescheduled.  Children: Do not bring any children with you. Make arrangements to keep them home. Dress appropriately: There is always a possibility that your clothing may get soiled. Avoid long dresses. Valuables: Do not bring any jewelry or valuables.  Reasons to call and reschedule or cancel your procedure: (Following these recommendations will minimize the risk of a serious complication.) Surgeries: Avoid having procedures within 2 weeks of any surgery. (Avoid for 2 weeks before or after any surgery). Flu Shots: Avoid having procedures within 2 weeks of a flu shots or . (Avoid for 2 weeks before or after immunizations). Barium: Avoid having a procedure within 7-10 days after having had a radiological study involving the use of radiological contrast. (Myelograms, Barium swallow or enema study). Heart attacks: Avoid any elective procedures or surgeries for the initial 6 months after a "Myocardial Infarction" (Heart Attack). Blood thinners: It is imperative that you stop these medications before procedures. Let us know if you if you take any blood thinner.  Infection: Avoid procedures during or within two weeks of an infection (including chest colds or gastrointestinal problems). Symptoms associated with infections include: Localized redness, fever, chills, night sweats or profuse sweating, burning sensation when voiding, cough, congestion, stuffiness, runny nose, sore throat, diarrhea, nausea, vomiting, cold or Flu symptoms, recent or current infections. It is specially important if the infection is   over the area that we intend to treat. Heart and lung problems: Symptoms that may suggest an  active cardiopulmonary problem include: cough, chest pain, breathing difficulties or shortness of breath, dizziness, ankle swelling, uncontrolled high or unusually low blood pressure, and/or palpitations. If you are experiencing any of these symptoms, cancel your procedure and contact your primary care physician for an evaluation.  Remember:  Regular Business hours are:  Monday to Thursday 8:00 AM to 4:00 PM  Provider's Schedule: Nainika Newlun, MD:  Procedure days: Tuesday and Thursday 7:30 AM to 4:00 PM  Bilal Lateef, MD:  Procedure days: Monday and Wednesday 7:30 AM to 4:00 PM  ______________________________________________________________________    ____________________________________________________________________________________________  General Risks and Possible Complications  Patient Responsibilities: It is important that you read this as it is part of your informed consent. It is our duty to inform you of the risks and possible complications associated with treatments offered to you. It is your responsibility as a patient to read this and to ask questions about anything that is not clear or that you believe was not covered in this document.  Patient's Rights: You have the right to refuse treatment. You also have the right to change your mind, even after initially having agreed to have the treatment done. However, under this last option, if you wait until the last second to change your mind, you may be charged for the materials used up to that point.  Introduction: Medicine is not an exact science. Everything in Medicine, including the lack of treatment(s), carries the potential for danger, harm, or loss (which is by definition: Risk). In Medicine, a complication is a secondary problem, condition, or disease that can aggravate an already existing one. All treatments carry the risk of possible complications. The fact that a side effects or complications occurs, does not imply  that the treatment was conducted incorrectly. It must be clearly understood that these can happen even when everything is done following the highest safety standards.  No treatment: You can choose not to proceed with the proposed treatment alternative. The "PRO(s)" would include: avoiding the risk of complications associated with the therapy. The "CON(s)" would include: not getting any of the treatment benefits. These benefits fall under one of three categories: diagnostic; therapeutic; and/or palliative. Diagnostic benefits include: getting information which can ultimately lead to improvement of the disease or symptom(s). Therapeutic benefits are those associated with the successful treatment of the disease. Finally, palliative benefits are those related to the decrease of the primary symptoms, without necessarily curing the condition (example: decreasing the pain from a flare-up of a chronic condition, such as incurable terminal cancer).  General Risks and Complications: These are associated to most interventional treatments. They can occur alone, or in combination. They fall under one of the following six (6) categories: no benefit or worsening of symptoms; bleeding; infection; nerve damage; allergic reactions; and/or death. No benefits or worsening of symptoms: In Medicine there are no guarantees, only probabilities. No healthcare provider can ever guarantee that a medical treatment will work, they can only state the probability that it may. Furthermore, there is always the possibility that the condition may worsen, either directly, or indirectly, as a consequence of the treatment. Bleeding: This is more common if the patient is taking a blood thinner, either prescription or over the counter (example: Goody Powders, Fish oil, Aspirin, Garlic, etc.), or if suffering a condition associated with impaired coagulation (example: Hemophilia, cirrhosis of the liver, low platelet counts, etc.). However, even if   you  do not have one on these, it can still happen. If you have any of these conditions, or take one of these drugs, make sure to notify your treating physician. Infection: This is more common in patients with a compromised immune system, either due to disease (example: diabetes, cancer, human immunodeficiency virus [HIV], etc.), or due to medications or treatments (example: therapies used to treat cancer and rheumatological diseases). However, even if you do not have one on these, it can still happen. If you have any of these conditions, or take one of these drugs, make sure to notify your treating physician. Nerve Damage: This is more common when the treatment is an invasive one, but it can also happen with the use of medications, such as those used in the treatment of cancer. The damage can occur to small secondary nerves, or to large primary ones, such as those in the spinal cord and brain. This damage may be temporary or permanent and it may lead to impairments that can range from temporary numbness to permanent paralysis and/or brain death. Allergic Reactions: Any time a substance or material comes in contact with our body, there is the possibility of an allergic reaction. These can range from a mild skin rash (contact dermatitis) to a severe systemic reaction (anaphylactic reaction), which can result in death. Death: In general, any medical intervention can result in death, most of the time due to an unforeseen complication. ____________________________________________________________________________________________    

## 2022-02-09 NOTE — Progress Notes (Signed)
PROVIDER NOTE: Interpretation of information contained herein should be left to medically-trained personnel. Specific patient instructions are provided elsewhere under "Patient Instructions" section of medical record. This document was created in part using STT-dictation technology, any transcriptional errors that may result from this process are unintentional.  Patient: Charlene [name] Type: Established DOB: Mar 26, 1960 MRN: [id number] PCP: Tracie Harrier, MD  Service: Procedure DOS: 02/11/2022 Setting: Ambulatory Location: Ambulatory outpatient facility Delivery: Face-to-face Provider: Gaspar Cola, MD Specialty: Interventional Pain Management Specialty designation: 09 Location: Outpatient facility Ref. Prov.: Milinda Pointer, MD     Interventional Therapy          Procedure #1: Gelsyn-3 Intra-articular Knee Injection #3  Laterality: Left (-LT) Level/approach: Lateral Imaging guidance: None required ([id number]) Anesthesia: Local anesthesia (1-2% Lidocaine) Anxiolysis: None                 Sedation: No Sedation                       DOS: 02/11/2022  Performed by: Gaspar Cola, MD  Purpose: Diagnostic/Therapeutic Indications: Knee arthralgia associated to osteoarthritis of the knee 1. Osteoarthritis of knee (Left)   2. Chronic knee pain (Left)    NAS-11 score:   Pre-procedure: 1 /10   Post-procedure: 1 /10   Procedure #2: Lumbar epidural steroid injection (LESI) (interlaminar) #1 (w/o steroids)    Laterality: Left   Level:  L3-4 Level.  Imaging: Fluoroscopic guidance         Anesthesia: Local anesthesia (1-2% Lidocaine) Anxiolysis: IV Versed         Sedation: No Sedation                       DOS: 02/11/2022  Performed by: Gaspar Cola, MD  Purpose: Diagnostic/Therapeutic Indications: Lumbar radicular pain of intraspinal etiology of more than 4 weeks that has failed to respond to conservative therapy and is severe enough to impact quality of life  or function. 1. Chronic low back pain (3ry area of Pain) (Bilateral) (L>R) w/ sciatica (Left)   2. Chronic lower extremity pain (2ry area of Pain) (Bilateral) (L>R)   3. Closed compression fracture of L3 lumbar vertebra, sequela   4. DDD (degenerative disc disease), lumbar   5. 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 Lumbar foraminal stenosis (L5-S1) (Right)   7. Abnormal x-ray of lumbar spine (03/16/2021)   8. Back pain with left-sided sciatica    NAS-11 Pain score:   Pre-procedure: 1 /10   Post-procedure: 1 /10      Position / Prep / Materials:  Procedure #1:  Location setting: Exam room Position: Sitting w/ knee bent 90 degrees Safety Precautions: Patient was assessed for positional comfort and pressure points before starting the procedure. Prepping solution: DuraPrep (Iodine Povacrylex [0.7% available iodine] and Isopropyl Alcohol, 74% w/w) Prep Area: Entire knee region Approach: percutaneous, just above the tibial plateau, lateral to the infrapatellar tendon. Intended target: Intra-articular knee space Materials: Tray: Block Needle(s): Regular Qty: 1/side Length: 1.5-inch Gauge: 25G  Procedure #2: Location: Fluoroscopy suite Position: Prone w/ head of the table raised (slight reverse trendelenburg) to facilitate breathing.  Prep solution: DuraPrep (Iodine Povacrylex [0.7% available iodine] and Isopropyl Alcohol, 74% w/w) Prep Area: Entire Posterior Lumbar Region from lower scapular tip down to mid buttocks area and from flank to flank. Materials:  Tray: Epidural tray Needle(s):  Type: Epidural needle (Tuohy) Gauge (G):  17 Length: Regular (3.5-in) Qty:  1  Pre-op H&P Assessment:  Ms. Olivarez is a 62 y.o. (year old), female patient, seen today for interventional treatment. She  has a past surgical history that includes arthroscopic rotator cuff; Colonoscopy; Abdominal hysterectomy; endosopic sinus; and Cataract extraction w/PHACO (Right, 11/25/2017). Ms.  Marcelli has a current medication list which includes the following prescription(s): amlodipine, carvedilol, cholecalciferol, freestyle libre sensor system, dicyclomine, duloxetine, hydralazine, toujeo max solostar, insulin lispro, lidocaine, lisinopril, metformin, nortriptyline, pantoprazole, polyethylene glycol, pravastatin, pregabalin, sucralfate, tramadol, baclofen, and magnesium, and the following Facility-Administered Medications: lactated ringers. Her primarily concern today is the Back Pain (lower)  Initial Vital Signs:  Pulse/HCG Rate: 91ECG Heart Rate: 96 Temp: (!) 97.1 F (36.2 C) Resp: 18 BP: (!) 147/87 SpO2: 100 %  BMI: Estimated body mass index is 29.29 kg/m as calculated from the following:   Height as of this encounter: '4\' 11"'$  (1.499 m).   Weight as of this encounter: 145 lb (65.8 kg).  Risk Assessment: Allergies: Reviewed. She is allergic to semaglutide.  Allergy Precautions: None required Coagulopathies: Reviewed. None identified.  Blood-thinner therapy: None at this time Active Infection(s): Reviewed. None identified. Ms. Kettering is afebrile  Site Confirmation: Ms. Bowne was asked to confirm the procedure and laterality before marking the site Procedure checklist: Completed Consent: Before the procedure and under the influence of no sedative(s), amnesic(s), or anxiolytics, the patient was informed of the treatment options, risks and possible complications. To fulfill our ethical and legal obligations, as recommended by the American Medical Association's Code of Ethics, I have informed the patient of my clinical impression; the nature and purpose of the treatment or procedure; the risks, benefits, and possible complications of the intervention; the alternatives, including doing nothing; the risk(s) and benefit(s) of the alternative treatment(s) or procedure(s); and the risk(s) and benefit(s) of doing nothing. The patient was provided information about the general risks and  possible complications associated with the procedure. These may include, but are not limited to: failure to achieve desired goals, infection, bleeding, organ or nerve damage, allergic reactions, paralysis, and death. In addition, the patient was informed of those risks and complications associated to Spine-related procedures, such as failure to decrease pain; infection (i.e.: Meningitis, epidural or intraspinal abscess); bleeding (i.e.: epidural hematoma, subarachnoid hemorrhage, or any other type of intraspinal or peri-dural bleeding); organ or nerve damage (i.e.: Any type of peripheral nerve, nerve root, or spinal cord injury) with subsequent damage to sensory, motor, and/or autonomic systems, resulting in permanent pain, numbness, and/or weakness of one or several areas of the body; allergic reactions; (i.e.: anaphylactic reaction); and/or death. Furthermore, the patient was informed of those risks and complications associated with the medications. These include, but are not limited to: allergic reactions (i.e.: anaphylactic or anaphylactoid reaction(s)); adrenal axis suppression; blood sugar elevation that in diabetics may result in ketoacidosis or comma; water retention that in patients with history of congestive heart failure may result in shortness of breath, pulmonary edema, and decompensation with resultant heart failure; weight gain; swelling or edema; medication-induced neural toxicity; particulate matter embolism and blood vessel occlusion with resultant organ, and/or nervous system infarction; and/or aseptic necrosis of one or more joints. Finally, the patient was informed that Medicine is not an exact science; therefore, there is also the possibility of unforeseen or unpredictable risks and/or possible complications that may result in a catastrophic outcome. The patient indicated having understood very clearly. We have given the patient no guarantees and we have made no promises. Enough time was  given to the  patient to ask questions, all of which were answered to the patient's satisfaction. Ms. Krah has indicated that she wanted to continue with the procedure. Attestation: I, the ordering provider, attest that I have discussed with the patient the benefits, risks, side-effects, alternatives, likelihood of achieving goals, and potential problems during recovery for the procedure that I have provided informed consent. Date  Time: 02/11/2022 11:30 AM  Pre-Procedure Preparation:  Monitoring: As per clinic protocol. Respiration, ETCO2, SpO2, BP, heart rate and rhythm monitor placed and checked for adequate function Safety Precautions: Patient was assessed for positional comfort and pressure points before starting the procedure. Time-out: I initiated and conducted the "Time-out" before starting the procedure, as per protocol. The patient was asked to participate by confirming the accuracy of the "Time Out" information. Verification of the correct person, site, and procedure were performed and confirmed by me, the nursing staff, and the patient. "Time-out" conducted as per Joint Commission's Universal Protocol (UP.01.01.01). Time: 1155  Description of procedure #1  Start Time: 1155 hrs  Local Anesthesia: Once the patient was positioned, prepped, and time-out was completed. The target area was identified located. The skin was marked with an approved surgical skin marker. Once marked, the skin (epidermis, dermis, and hypodermis), and deeper tissues (fat, connective tissue and muscle) were infiltrated with a small amount of a short-acting local anesthetic, loaded on a 10cc syringe with a 25G, 1.5-in  Needle. An appropriate amount of time was allowed for local anesthetics to take effect before proceeding to the next step. Local Anesthetic: Lidocaine 1-2% The unused portion of the local anesthetic was discarded in the proper designated containers. Safety Precautions: Aspiration looking for blood return  was conducted prior to all injections. At no point did I inject any substances, as a needle was being advanced. Before injecting, the patient was told to immediately notify me if she was experiencing any new onset of "ringing in the ears, or metallic taste in the mouth". No attempts were made at seeking any paresthesias. Safe injection practices and needle disposal techniques used. Medications properly checked for expiration dates. SDV (single dose vial) medications used. After the completion of the procedure, all disposable equipment used was discarded in the proper designated medical waste containers.  Technical description: Protocol guidelines were followed. After positioning, the target area was identified and prepped in the usual manner. Skin & deeper tissues infiltrated with local anesthetic. Appropriate amount of time allowed to pass for local anesthetics to take effect. Proper needle placement secured. Once satisfactory needle placement was confirmed, I proceeded to inject the desired solution in slow, incremental fashion, intermittently assessing for discomfort or any signs of abnormal or undesired spread of substance. Once completed, the needle was removed and disposed of, as per hospital protocols. The area was cleaned, making sure to leave some of the prepping solution back to take advantage of its long term bactericidal properties.  Aspiration:  Negative          Imaging guidance  Imaging-assisted Technique: None required. Indication(s): N/A Exposure Time: N/A Contrast: None Fluoroscopic Guidance: N/A Ultrasound Guidance: N/A Interpretation: N/A   Description/Narrative of Procedure #2:          Target: Epidural space via interlaminar opening, initially targeting the lower laminar border of the superior vertebral body. Region: Lumbar Approach: Percutaneous paravertebral  Rationale (medical necessity): procedure needed and proper for the diagnosis and/or treatment of the patient's  medical symptoms and needs. Procedural Technique Safety Precautions: Aspiration looking for blood return was conducted prior  to all injections. At no point did we inject any substances, as a needle was being advanced. No attempts were made at seeking any paresthesias. Safe injection practices and needle disposal techniques used. Medications properly checked for expiration dates. SDV (single dose vial) medications used. Description of the Procedure: Protocol guidelines were followed. The procedure needle was introduced through the skin, ipsilateral to the reported pain, and advanced to the target area. Bone was contacted and the needle walked caudad, until the lamina was cleared. The epidural space was identified using "loss-of-resistance technique" with 2-3 ml of PF-NaCl (0.9% NSS), in a 5cc LOR glass syringe.  Vitals:   02/11/22 1152 02/11/22 1157 02/11/22 1202 02/11/22 1208  BP: (!) 175/98 (!) 182/107 (!) 162/108 138/89  Pulse:      Resp: '18 16 18 18  '$ Temp:      SpO2: 100% 96% 97% 97%  Weight:      Height:       End Time: 1202 hrs.  Imaging Guidance (Spinal) for procedure #2:  Type of Imaging Technique: Fluoroscopy Guidance (Spinal) Indication(s): Assistance in needle guidance and placement for procedures requiring needle placement in or near specific anatomical locations not easily accessible without such assistance. Exposure Time: Please see nurses notes. Contrast: Before injecting any contrast, we confirmed that the patient did not have an allergy to iodine, shellfish, or radiological contrast. Once satisfactory needle placement was completed at the desired level, radiological contrast was injected. Contrast injected under live fluoroscopy. No contrast complications. See chart for type and volume of contrast used. Fluoroscopic Guidance: I was personally present during the use of fluoroscopy. "Tunnel Vision Technique" used to obtain the best possible view of the target area. Parallax error  corrected before commencing the procedure. "Direction-depth-direction" technique used to introduce the needle under continuous pulsed fluoroscopy. Once target was reached, antero-posterior, oblique, and lateral fluoroscopic projection used confirm needle placement in all planes. Images permanently stored in EMR. Interpretation: I personally interpreted the imaging intraoperatively. Adequate needle placement confirmed in multiple planes. Appropriate spread of contrast into desired area was observed. No evidence of afferent or efferent intravascular uptake. No intrathecal or subarachnoid spread observed. Permanent images saved into the patient's record.  Post-operative Assessment:  Post-procedure Vital Signs:  Pulse/HCG Rate: 9189 Temp: (!) 97.1 F (36.2 C) Resp: 18 BP: 138/89 SpO2: 97 %  EBL: None  Complications: No immediate post-treatment complications observed by team, or reported by patient.  Note: The patient tolerated the entire procedure well. A repeat set of vitals were taken after the procedure and the patient was kept under observation following institutional policy, for this type of procedure. Post-procedural neurological assessment was performed, showing return to baseline, prior to discharge. The patient was provided with post-procedure discharge instructions, including a section on how to identify potential problems. Should any problems arise concerning this procedure, the patient was given instructions to immediately contact us, at any time, without hesitation. In any case, we plan to contact the patient by telephone for a follow-up status report regarding this interventional procedure.  Comments:  No additional relevant information.  Plan of Care  Orders:  Orders Placed This Encounter  Procedures   Lumbar Epidural Injection    Scheduling Instructions:     Procedure: Interlaminar LESI L3-4     Laterality: Left     Sedation: Patient's choice     Timeframe: Today    Order  Specific Question:   Where will this procedure be performed?    Answer:   [facility]   KNEE INJECTION    Indications: Knee arthralgia (pain) due to osteoarthritis (OA) Imaging: None (CPT-20610) Position: Sitting Equipment/Materials: Block tray  1.5", 25-G (one per side)  Local anesthetic  Monovisc (one per side) Confirm availability (in office) of Monovisc (HMW hyaluronan)    Scheduling Instructions:     Procedure: Knee injection Monovisc (Hyaluronan/Hyaluronic acid)     Treatment No.: 3     Level: Intra-articular     Laterality: Left Knee     Sedation: Patient's choice.    Order Specific Question:   Where will this procedure be performed?    Answer:   [facility]   DG PAIN [facility] C-ARM 1-60 MIN NO REPORT    Intraoperative interpretation by procedural physician at [place].    Standing Status:   Standing    Number of Occurrences:   1    Order Specific Question:   Reason for exam:    Answer:   Assistance in needle guidance and placement for procedures requiring needle placement in or near specific anatomical locations not easily accessible without such assistance.   Informed Consent Details: Physician/Practitioner Attestation; Transcribe to consent form and obtain patient signature    Note: Always confirm laterality of pain with [name], before procedure. Transcribe to consent form and obtain patient signature.    Order Specific Question:   Physician/Practitioner attestation of informed consent for procedure/surgical case    Answer:   I, the physician/practitioner, attest that I have discussed with the patient the benefits, risks, side effects, alternatives, likelihood of achieving goals and potential problems during recovery for the procedure that I have provided informed consent.    Order Specific Question:   Procedure    Answer:   Lumbar epidural steroid injection under fluoroscopic guidance    Order Specific Question:   Physician/Practitioner  performing the procedure    Answer:   Sione Baumgarten A. Dossie Arbour, MD    Order Specific Question:   Indication/Reason    Answer:   Low back and/or lower extremity pain secondary to lumbar radiculitis   Provide equipment / supplies at bedside    Procedural tray: Epidural Tray (Disposable  single use) Skin infiltration needle: Regular 1.5-in, 25-G, (x1) Block needle size: Regular standard Catheter: No catheter required    Standing Status:   Standing    Number of Occurrences:   1    Order Specific Question:   Specify    Answer:   Epidural Tray   Informed Consent Details: Physician/Practitioner Attestation; Transcribe to consent form and obtain patient signature    Nursing Order: Transcribe to consent form and obtain patient signature. Note: Always confirm laterality of pain with [name], before procedure.    Order Specific Question:   Physician/Practitioner attestation of informed consent for procedure/surgical case    Answer:   I, the physician/practitioner, attest that I have discussed with the patient the benefits, risks, side effects, alternatives, likelihood of achieving goals and potential problems during recovery for the procedure that I have provided informed consent.    Order Specific Question:   Procedure    Answer:   Therapeutic intra-articular viscosupplementation knee injection    Order Specific Question:   Physician/Practitioner performing the procedure    Answer:   Valinda Fedie A. Dossie Arbour, MD    Order Specific Question:   Indication/Reason    Answer:   Chronic knee pain secondary to primary osteoarthritis of the knee  (Right-M17.11), (Left-M17.12) or (Bilateral-M17.0)   Chronic Opioid Analgesic:  Tramadol 50 mg,  1 tab PO QD. ABNORMAL UDS (09/03/2020) (+) for COCAINE. MME: 5 mg/day   Medications ordered for procedure: Meds ordered this encounter  Medications   iohexol (OMNIPAQUE) 180 MG/ML injection 10 mL    Must be Myelogram-compatible. If not available, you may substitute with a  water-soluble, non-ionic, hypoallergenic, myelogram-compatible radiological contrast medium.   lidocaine (XYLOCAINE) 2 % (with pres) injection 400 mg   pentafluoroprop-tetrafluoroeth (GEBAUERS) aerosol   lactated ringers infusion   midazolam (VERSED) injection 0.5-2 mg    Make sure Flumazenil is available in the pyxis when using this medication. If oversedation occurs, administer 0.2 mg IV over 15 sec. If after 45 sec no response, administer 0.2 mg again over 1 min; may repeat at 1 min intervals; not to exceed 4 doses (1 mg)   ropivacaine (PF) 2 mg/mL (0.2%) (NAROPIN) injection 2 mL   sodium chloride flush (NS) 0.9 % injection 2 mL   sodium hyaluronate (viscosup) (GELSYN-3) intra-articular injection 16.8 mg    Do not substitute. Deliver to facility day before procedure.   Medications administered: We administered iohexol, lidocaine, pentafluoroprop-tetrafluoroeth, lactated ringers, midazolam, ropivacaine (PF) 2 mg/mL (0.2%), sodium chloride flush, and sodium hyaluronate (viscosup).  See the medical record for exact dosing, route, and time of administration.  Follow-up plan:   Return in about 2 weeks (around 02/25/2022) for Proc-day (T,Th), (F2F), (PPE).        Interventional Therapies  Risk Factors  Considerations:   NOTE: UNCONTROLLED IDDM (NO STEROIDS)  ABNORMAL UDS (09/03/2020) (+) for COCAINE       Planned  Pending:   Therapeutic left IA Gelsyn-3 knee inj. #3 + left L3-4 LESI #1 (w/o steroids)    Under consideration:   Possible bilateral lumbar facet RFA  Diagnostic bilateral genicular NB    Completed:   Diagnostic right lumbar facet MBB x3 (no steroid) (03/26/2021) (100/100/50/50)  Diagnostic left lumbar facet MBB x1 (w/ steroid) (10/09/2020) (100/100/90/L-100; R-50)  Therapeutic left L5 TFESI x4 (09/21/2018) (100/100/50/70)  Therapeutic left L4-5 LESI x4 (09/21/2018) (100/100/50/70)  Therapeutic left L5-S1 LESI x2 (02/25/2018) (100/100/100/100)  Therapeutic bilateral Hyalgan  knee inj. x5 (02/25/2018) (100/100/100 x2 weeks/<50)  Therapeutic left IA steroid knee injection x1 (11/23/2017) (100/100/0/0)  Therapeutic left IA Gelsyn-3 knee inj. x2    Therapeutic  Palliative (PRN) options:   Palliative L5 TFESI  Palliative L4-5 LESI   Palliative L5-S1 LESI   Palliative Hyalgan knee inj.      Recent Visits Date Type Provider Dept  01/21/22 Office Visit Milinda Pointer, MD [facility]  12/31/21 Procedure visit Milinda Pointer, MD [facility]  12/10/21 Procedure visit Milinda Pointer, MD [facility]  12/03/21 Office Visit Milinda Pointer, MD [facility]  Showing recent visits within past 90 days and meeting all other requirements Today's Visits Date Type Provider Dept  02/11/22 Procedure visit Milinda Pointer, MD [facility]  Showing today's visits and meeting all other requirements Future Appointments Date Type Provider Dept  02/26/22 Appointment Milinda Pointer, MD [facility]  Showing future appointments within next 90 days and meeting all other requirements  Disposition: Discharge home  Discharge (Date  Time): 02/11/2022; 1215 hrs.   Primary Care Physician: Tracie Harrier, MD Location: [facility] Note by: Gaspar Cola, MD Date: 02/11/2022; Time: 12:12 PM  Disclaimer:  Medicine is not an Chief Strategy Officer. The only guarantee in medicine is that nothing is guaranteed. It is important to note that the decision to proceed with this intervention was  based on the information collected from the patient. The Data and conclusions were drawn from the patient's questionnaire, the interview, and the physical examination. Because the information was provided in large part by the patient, it cannot be guaranteed that it has not been purposely or unconsciously manipulated. Every effort has been made to obtain as much relevant data as possible for this evaluation.  It is important to note that the conclusions that lead to this procedure are derived in large part from the available data. Always take into account that the treatment will also be dependent on availability of resources and existing treatment guidelines, considered by other Pain Management Practitioners as being common knowledge and practice, at the time of the intervention. For Medico-Legal purposes, it is also important to point out that variation in procedural techniques and pharmacological choices are the acceptable norm. The indications, contraindications, technique, and results of the above procedure should only be interpreted and judged by a Board-Certified Interventional Pain Specialist with extensive familiarity and expertise in the same exact procedure and technique.

## 2022-02-11 ENCOUNTER — Encounter: Payer: Self-pay | Admitting: Pain Medicine

## 2022-02-11 ENCOUNTER — Ambulatory Visit: Payer: Medicare Other | Attending: Pain Medicine | Admitting: Pain Medicine

## 2022-02-11 ENCOUNTER — Telehealth: Payer: Self-pay

## 2022-02-11 ENCOUNTER — Ambulatory Visit
Admission: RE | Admit: 2022-02-11 | Discharge: 2022-02-11 | Disposition: A | Discharge status: Home / Self Care | Payer: Medicare Other | Source: Ambulatory Visit | Attending: Pain Medicine | Admitting: Pain Medicine

## 2022-02-11 VITALS — BP 138/89 | HR 91 | Temp 97.1°F | Resp 18 | Ht 59.0 in | Wt 145.0 lb

## 2022-02-11 DIAGNOSIS — M5442 Lumbago with sciatica, left side: Secondary | ICD-10-CM | POA: Insufficient documentation

## 2022-02-11 DIAGNOSIS — R937 Abnormal findings on diagnostic imaging of other parts of musculoskeletal system: Secondary | ICD-10-CM | POA: Diagnosis present

## 2022-02-11 DIAGNOSIS — M4316 Spondylolisthesis, lumbar region: Secondary | ICD-10-CM | POA: Insufficient documentation

## 2022-02-11 DIAGNOSIS — G8929 Other chronic pain: Secondary | ICD-10-CM | POA: Insufficient documentation

## 2022-02-11 DIAGNOSIS — M79604 Pain in right leg: Secondary | ICD-10-CM | POA: Diagnosis present

## 2022-02-11 DIAGNOSIS — M1712 Unilateral primary osteoarthritis, left knee: Secondary | ICD-10-CM | POA: Insufficient documentation

## 2022-02-11 DIAGNOSIS — S32030S Wedge compression fracture of third lumbar vertebra, sequela: Secondary | ICD-10-CM | POA: Insufficient documentation

## 2022-02-11 DIAGNOSIS — M25562 Pain in left knee: Secondary | ICD-10-CM | POA: Insufficient documentation

## 2022-02-11 DIAGNOSIS — M5432 Sciatica, left side: Secondary | ICD-10-CM | POA: Diagnosis present

## 2022-02-11 DIAGNOSIS — M48061 Spinal stenosis, lumbar region without neurogenic claudication: Secondary | ICD-10-CM | POA: Diagnosis present

## 2022-02-11 DIAGNOSIS — M5136 Other intervertebral disc degeneration, lumbar region: Secondary | ICD-10-CM | POA: Diagnosis present

## 2022-02-11 DIAGNOSIS — M79605 Pain in left leg: Secondary | ICD-10-CM | POA: Diagnosis present

## 2022-02-11 MED ORDER — TRIAMCINOLONE ACETONIDE 40 MG/ML IJ SUSP
INTRAMUSCULAR | Status: AC
  Filled 2022-02-11: qty 1

## 2022-02-11 MED ORDER — MIDAZOLAM HCL 2 MG/2ML IJ SOLN
0.5000 mg | Freq: Once | INTRAMUSCULAR | Status: AC
  Administered 2022-02-11: 2 mg via INTRAVENOUS

## 2022-02-11 MED ORDER — SODIUM CHLORIDE (PF) 0.9 % IJ SOLN
INTRAMUSCULAR | Status: AC
  Filled 2022-02-11: qty 10

## 2022-02-11 MED ORDER — ROPIVACAINE HCL 2 MG/ML IJ SOLN
INTRAMUSCULAR | Status: AC
  Filled 2022-02-11: qty 20

## 2022-02-11 MED ORDER — IOHEXOL 180 MG/ML  SOLN
10.0000 mL | Freq: Once | INTRAMUSCULAR | Status: AC
  Administered 2022-02-11: 10 mL via EPIDURAL

## 2022-02-11 MED ORDER — PENTAFLUOROPROP-TETRAFLUOROETH EX AERO
INHALATION_SPRAY | Freq: Once | CUTANEOUS | Status: AC
  Administered 2022-02-11: 30 via TOPICAL
  Filled 2022-02-11: qty 116

## 2022-02-11 MED ORDER — LIDOCAINE HCL 2 % IJ SOLN
20.0000 mL | Freq: Once | INTRAMUSCULAR | Status: AC
  Administered 2022-02-11: 100 mg

## 2022-02-11 MED ORDER — LIDOCAINE HCL (PF) 2 % IJ SOLN
INTRAMUSCULAR | Status: AC
  Filled 2022-02-11: qty 15

## 2022-02-11 MED ORDER — LACTATED RINGERS IV SOLN: Freq: Once | INTRAVENOUS | Status: AC

## 2022-02-11 MED ORDER — ROPIVACAINE HCL 2 MG/ML IJ SOLN
2.0000 mL | Freq: Once | INTRAMUSCULAR | Status: AC
  Administered 2022-02-11: 2 mL via EPIDURAL

## 2022-02-11 MED ORDER — SODIUM HYALURONATE (VISCOSUP) 16.8 MG/2ML IX SOSY
16.8000 mg | PREFILLED_SYRINGE | Freq: Once | INTRA_ARTICULAR | Status: AC
  Administered 2022-02-11: 16.8 mg via INTRA_ARTICULAR
  Filled 2022-02-11: qty 2

## 2022-02-11 MED ORDER — SODIUM CHLORIDE 0.9% FLUSH
2.0000 mL | Freq: Once | INTRAVENOUS | Status: AC
  Administered 2022-02-11: 2 mL

## 2022-02-11 MED ORDER — MIDAZOLAM HCL 2 MG/2ML IJ SOLN
INTRAMUSCULAR | Status: AC
  Filled 2022-02-11: qty 2

## 2022-02-11 NOTE — Telephone Encounter (Signed)
My chart message sent re elevators 

## 2022-02-11 NOTE — Patient Instructions (Signed)
____________________________________________________________________________________________  Post-Procedure Discharge Instructions  Instructions: Apply ice:  Purpose: This will minimize any swelling and discomfort after procedure.  When: Day of procedure, as soon as you get home. How: Fill a plastic sandwich bag with crushed ice. Cover it with a small towel and apply to injection site. How long: (15 min on, 15 min off) Apply for 15 minutes then remove x 15 minutes.  Repeat sequence on day of procedure, until you go to bed. Apply heat:  Purpose: To treat any soreness and discomfort from the procedure. When: Starting the next day after the procedure. How: Apply heat to procedure site starting the day following the procedure. How long: May continue to repeat daily, until discomfort goes away. Food intake: Start with clear liquids (like water) and advance to regular food, as tolerated.  Physical activities: Keep activities to a minimum for the first 8 hours after the procedure. After that, then as tolerated. Driving: If you have received any sedation, be responsible and do not drive. You are not allowed to drive for 24 hours after having sedation. Blood thinner: (Applies only to those taking blood thinners) You may restart your blood thinner 6 hours after your procedure. Insulin: (Applies only to Diabetic patients taking insulin) As soon as you can eat, you may resume your normal dosing schedule. Infection prevention: Keep procedure site clean and dry. Shower daily and clean area with soap and water. Post-procedure Pain Diary: Extremely important that this be done correctly and accurately. Recorded information will be used to determine the next step in treatment. For the purpose of accuracy, follow these rules: Evaluate only the area treated. Do not report or include pain from an untreated area. For the purpose of this evaluation, ignore all other areas of pain, except for the treated  area. After your procedure, avoid taking a long nap and attempting to complete the pain diary after you wake up. Instead, set your alarm clock to go off every hour, on the hour, for the initial 8 hours after the procedure. Document the duration of the numbing medicine, and the relief you are getting from it. Do not go to sleep and attempt to complete it later. It will not be accurate. If you received sedation, it is likely that you were given a medication that may cause amnesia. Because of this, completing the diary at a later time may cause the information to be inaccurate. This information is needed to plan your care. Follow-up appointment: Keep your post-procedure follow-up evaluation appointment after the procedure (usually 2 weeks for most procedures, 6 weeks for radiofrequencies). DO NOT FORGET to bring you pain diary with you.   Expect: (What should I expect to see with my procedure?) From numbing medicine (AKA: Local Anesthetics): Numbness or decrease in pain. You may also experience some weakness, which if present, could last for the duration of the local anesthetic. Onset: Full effect within 15 minutes of injected. Duration: It will depend on the type of local anesthetic used. On the average, 1 to 8 hours.  NO STEROIDS USED. From procedure: Some discomfort is to be expected once the numbing medicine wears off. This should be minimal if ice and heat are applied as instructed.  Call if: (When should I call?) You experience numbness and weakness that gets worse with time, as opposed to wearing off. New onset bowel or bladder incontinence. (Applies only to procedures done in the spine)  Emergency Numbers: Durning business hours (Monday - Thursday, 8:00 AM - 4:00 PM) (Friday,  9:00 AM - 12:00 Noon): [phone] After hours: [phone] NOTE: If you are having a problem and are unable connect with, or to talk to a provider, then go to your nearest urgent care or emergency department. If  the problem is serious and urgent, please call 911. ____________________________________________________________________________________________

## 2022-02-11 NOTE — Progress Notes (Signed)
Nursing Pain Medication Assessment:  Safety precautions to be maintained throughout the outpatient stay will include: orient to surroundings, keep bed in low position, maintain call bell within reach at all times, provide assistance with transfer out of bed and ambulation.  Medication Inspection Compliance: empty bottle  Medication: Tramadol (Ultram) Pill/Patch Count:  0 of 30 pills remain Pill/Patch Appearance:  empty bottle Bottle Appearance: Standard pharmacy container. Clearly labeled. Filled Date: 96 / 12 / 2023 Last Medication intake:  Ran out of medicine more than 48 hours ago

## 2022-02-12 ENCOUNTER — Telehealth: Payer: Self-pay

## 2022-02-12 NOTE — Telephone Encounter (Signed)
Called PP, denies any needs at this time. Instructed to call if needed.

## 2022-02-23 NOTE — Progress Notes (Unsigned)
PROVIDER NOTE: Information contained herein reflects review and annotations entered in association with encounter. Interpretation of such information and data should be left to medically-trained personnel. Information provided to patient can be located elsewhere in the medical record under "Patient Instructions". Document created using STT-dictation technology, any transcriptional errors that may result from process are unintentional.    Patient: Charlene [name]  Service Category: E/M  Provider: Gaspar Cola, MD  DOB: 02-Nov-1960  DOS: 02/26/2022  Referring Provider: Tracie Harrier, MD  MRN: [id number]  Specialty: Interventional Pain Management  PCP: Tracie Harrier, MD  Type: Established Patient  Setting: Ambulatory outpatient    Location: Office  Delivery: Face-to-face     HPI  [name], a 62 y.o. year old female, is here today because of her No primary diagnosis found.. [name] primary complain today is No chief complaint on file. Last encounter: My last encounter with her was on 02/11/2022. Pertinent problems: 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[place]); Chronic ankle pain (Left); Chronic foot pain (Left); Chronic lower extremity pain (2ry area of Pain) (Bilateral) (L>R); Chronic knee pain (4th area of Pain) (Bilateral) (L>R); Chronic low back pain (3ry area of Pain) (Bilateral) (L>R) w/ sciatica (Left); Chronic pain syndrome; DM type 2 with diabetic peripheral neuropathy (HCC); DDD (degenerative disc disease), lumbar; Lumbar facet arthropathy; Lumbar facet syndrome; Lumbar facet hypertrophy; Lumbar foraminal stenosis (L5-S1) (Right); Osteoarthritis of knee (Bilateral); Chronic musculoskeletal pain; Abnormal EMG (07/06/2017); Osteoarthritis involving multiple joints; 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[place]); Knee pain; Back pain  with left-sided sciatica; Meningioma ([place]); Neck pain; Chronic pain disorder; Left leg weakness; Meralgia paraesthetica, left; Pain in both lower extremities; Arthropathy of lumbar facet joint; Chronic ankle and foot pain (1ry area of Pain) (Left); 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 Pain Assessment: Severity of   is reported as a  /10. Location:    / . Onset:  . Quality:  . Timing:  . Modifying factor(s):  Marland Kitchen Vitals:  vitals were not taken for this visit.  BMI: Estimated body mass index is 29.29 kg/m as calculated from the following:   Height as of 02/11/22: '4\' 11"'$  (1.499 m).   Weight as of 02/11/22: 145 lb (65.8 kg).  Reason for encounter: post-procedure evaluation and assessment. ***  Post-procedure evaluation   Procedure #1: Gelsyn-3 Intra-articular Knee Injection #3  Laterality: Left (-LT) Level/approach: Lateral Imaging guidance: None required ([id number]) Anesthesia: Local anesthesia (1-2% Lidocaine) Anxiolysis: None                 Sedation: No Sedation                       DOS: 02/11/2022  Performed by: Gaspar Cola, MD  Purpose: Diagnostic/Therapeutic Indications: Knee arthralgia associated to osteoarthritis of the knee 1. Osteoarthritis of knee (Left)   2. Chronic knee pain (Left)    NAS-11 score:   Pre-procedure: 1 /10   Post-procedure: 1 /10   Procedure #2: Lumbar epidural  steroid injection (LESI) (interlaminar) #1 (w/o steroids)    Laterality: Left   Level:   L3-4 Level.  Imaging: Fluoroscopic guidance         Anesthesia: Local anesthesia (1-2% Lidocaine) Anxiolysis: IV Versed         Sedation: No Sedation                       DOS: 02/11/2022  Performed by: Gaspar Cola, MD  Purpose: Diagnostic/Therapeutic Indications: Lumbar radicular pain of intraspinal etiology of more than 4 weeks that has failed to respond to conservative therapy and is severe enough to impact quality of life or function. 1. Chronic low back pain (3ry area of Pain) (Bilateral) (L>R) w/ sciatica (Left)   2. Chronic lower extremity pain (2ry area of Pain) (Bilateral) (L>R)   3. Closed compression fracture of L3 lumbar vertebra, sequela   4. DDD (degenerative disc disease), lumbar   5. 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 Lumbar foraminal stenosis (L5-S1) (Right)   7. Abnormal x-ray of lumbar spine (03/16/2021)   8. Back pain with left-sided sciatica    NAS-11 Pain score:   Pre-procedure: 1 /10   Post-procedure: 1 /10      Effectiveness:  Initial hour after procedure:   ***. Subsequent 4-6 hours post-procedure:   ***. Analgesia past initial 6 hours:   ***. Ongoing improvement:  Analgesic:  *** Function:    ***    ROM:    ***     Pharmacotherapy Assessment  Analgesic: Tramadol 50 mg, 1 tab PO QD. ABNORMAL UDS (09/03/2020) (+) for COCAINE. MME: 5 mg/day   Monitoring: [place] PMP: PDMP reviewed during this encounter.       Pharmacotherapy: No side-effects or adverse reactions reported. Compliance: No problems identified. Effectiveness: Clinically acceptable.  No notes on file  No results found for: "CBDTHCR" No results found for: "D8THCCBX" No results found for: "D9THCCBX"  UDS:  Summary  Date Value Ref Range Status  03/11/2021 Note  Final    Comment:    ==================================================================== [employer] Select 13 (MW) ==================================================================== Test                              Result       Flag       Units    NO DRUGS DETECTED. ==================================================================== Test                      Result    Flag   Units      Ref Range   Creatinine              123              mg/dL      >=20 ==================================================================== Declared Medications:  The flagging and interpretation on this report are based on the  following declared medications.  Unexpected results may arise from  inaccuracies in the declared medications.   **Note: The testing scope of this panel does not include the  following reported medications:   Alendronate (Fosamax)  Amlodipine (Norvasc)  Baclofen (Lioresal)  Carvedilol (Coreg)  Dicyclomine (Bentyl)  Duloxetine (Cymbalta)  Hydralazine (Apresoline)  Insulin (Toujeo)  Lisinopril (Zestril)  Magnesium  Metformin (Glucophage)  Pantoprazole (Protonix)  Polyethylene Glycol  Pravastatin (Pravachol)  Pregabalin (Lyrica)  Sucralfate (Carafate)  Vitamin D3 ==================================================================== For clinical consultation, please call [phone]. ====================================================================  ROS  Constitutional: Denies any fever or chills Gastrointestinal: No reported hemesis, hematochezia, vomiting, or acute GI distress Musculoskeletal: Denies any acute onset joint swelling, redness, loss of ROM, or weakness Neurological: No reported episodes of acute onset apraxia, aphasia, dysarthria, agnosia, amnesia, paralysis, loss of coordination, or loss of consciousness  Medication Review  Cholecalciferol, DULoxetine, FreeStyle Libre Sensor System, Magnesium, amLODipine, baclofen, carvedilol, dicyclomine, hydrALAZINE, insulin glargine (2 Unit Dial), insulin lispro, lidocaine, lisinopril, metFORMIN, nortriptyline, pantoprazole, polyethylene glycol, pravastatin, pregabalin, sucralfate, and traMADol  History  Review  Allergy: Ms. Prajapati is allergic to semaglutide. Drug: Ms. Huwe  reports no history of drug use. Alcohol:  reports that she does not currently use alcohol. Tobacco:  reports that she has never smoked. She has never used smokeless tobacco. Social: Ms. Mullinax  reports that she has never smoked. She has never used smokeless tobacco. She reports that she does not currently use alcohol. She reports that she does not use drugs. Medical:  has a past medical history of Arthritis, Degenerative disc disease, lumbar, Diabetes mellitus without complication ([place]), GERD (gastroesophageal reflux disease), Hypertension, Neuropathy, Polyneuropathy, and Polyneuropathy. Surgical: Ms. Eskin  has a past surgical history that includes arthroscopic rotator cuff; Colonoscopy; Abdominal hysterectomy; endosopic sinus; and Cataract extraction w/PHACO (Right, 11/25/2017). Family: family history includes Alcohol abuse in her father; Breast cancer in her maternal aunt; Cancer in her father.  Laboratory Chemistry Profile   Renal Lab Results  Component Value Date   BUN 13 12/23/2018   CREATININE 0.59 12/23/2018   BCR 17 10/12/2017   GFRAA >60 12/23/2018   GFRNONAA >60 12/23/2018    Hepatic Lab Results  Component Value Date   AST 17 10/21/2018   ALT 21 10/21/2018   ALBUMIN 3.6 10/21/2018   ALKPHOS 81 10/21/2018   LIPASE 20 10/21/2018    Electrolytes Lab Results  Component Value Date   NA 137 12/23/2018   K 4.1 12/23/2018   CL 100 12/23/2018   CALCIUM 9.8 12/23/2018   MG 2.0 10/12/2017    Bone Lab Results  Component Value Date   25OHVITD1 17 (L) 10/12/2017   25OHVITD2 <1.0 10/12/2017   25OHVITD3 17 10/12/2017    Inflammation (CRP: Acute Phase) (ESR: Chronic Phase) Lab Results  Component Value Date   CRP 4 10/12/2017   ESRSEDRATE 12 10/12/2017         Note: Above Lab results reviewed.  Recent Imaging Review  DG PAIN CLINIC C-ARM 1-60 MIN NO REPORT Fluoro was used, but no Radiologist  interpretation will be provided.  Please refer to "NOTES" tab for provider progress note. Note: Reviewed        Physical Exam  General appearance: Well nourished, well developed, and well hydrated. In no apparent acute distress Mental status: Alert, oriented x 3 (person, place, & time)       Respiratory: No evidence of acute respiratory distress Eyes: PERLA Vitals: There were no vitals taken for this visit. BMI: Estimated body mass index is 29.29 kg/m as calculated from the following:   Height as of 02/11/22: '4\' 11"'$  (1.499 m).   Weight as of 02/11/22: 145 lb (65.8 kg). Ideal: Patient must be at least 60 in tall to calculate ideal body weight  Assessment   Diagnosis Status  No diagnosis found. Controlled Controlled Controlled   Updated Problems: No problems updated.  Plan of Care  Problem-specific:  No problem-specific Assessment & Plan notes found for this encounter.  Ms. LIEN LYMAN has a current medication list which includes the  following long-term medication(s): amlodipine, baclofen, carvedilol, cholecalciferol, dicyclomine, duloxetine, hydralazine, toujeo max solostar, insulin lispro, lisinopril, magnesium, nortriptyline, pantoprazole, pravastatin, pregabalin, sucralfate, and tramadol.  Pharmacotherapy (Medications Ordered): No orders of the defined types were placed in this encounter.  Orders:  No orders of the defined types were placed in this encounter.  Follow-up plan:   No follow-ups on file.     Interventional Therapies  Risk Factors  Considerations:   NOTE: UNCONTROLLED IDDM (NO STEROIDS)  ABNORMAL UDS (09/03/2020) (+) for COCAINE       Planned  Pending:   Therapeutic left IA Gelsyn-3 knee inj. #3 + left L3-4 LESI #1 (w/o steroids)    Under consideration:   Possible bilateral lumbar facet RFA  Diagnostic bilateral genicular NB    Completed:   Diagnostic right lumbar facet MBB x3 (no steroid) (03/26/2021) (100/100/50/50)  Diagnostic left lumbar  facet MBB x1 (w/ steroid) (10/09/2020) (100/100/90/L-100; R-50)  Therapeutic left L5 TFESI x4 (09/21/2018) (100/100/50/70)  Therapeutic left L4-5 LESI x4 (09/21/2018) (100/100/50/70)  Therapeutic left L5-S1 LESI x2 (02/25/2018) (100/100/100/100)  Therapeutic bilateral Hyalgan knee inj. x5 (02/25/2018) (100/100/100 x2 weeks/<50)  Therapeutic left IA steroid knee injection x1 (11/23/2017) (100/100/0/0)  Therapeutic left IA Gelsyn-3 knee inj. x2    Therapeutic  Palliative (PRN) options:   Palliative L5 TFESI  Palliative L4-5 LESI   Palliative L5-S1 LESI   Palliative Hyalgan knee inj.       Recent Visits Date Type Provider Dept  02/11/22 Procedure visit Milinda Pointer, MD [facility]  01/21/22 Office Visit Milinda Pointer, MD [facility]  12/31/21 Procedure visit Milinda Pointer, MD [facility]  12/10/21 Procedure visit Milinda Pointer, MD [facility]  12/03/21 Office Visit Milinda Pointer, MD [facility]  Showing recent visits within past 90 days and meeting all other requirements Future Appointments Date Type Provider Dept  02/26/22 Appointment Milinda Pointer, MD [facility]  Showing future appointments within next 90 days and meeting all other requirements  I discussed the assessment and treatment plan with the patient. The patient was provided an opportunity to ask questions and all were answered. The patient agreed with the plan and demonstrated an understanding of the instructions.  Patient advised to call back or seek an in-person evaluation if the symptoms or condition worsens.  Duration of encounter: *** minutes.  Total time on encounter, as per AMA guidelines included both the face-to-face and non-face-to-face time personally spent by the physician and/or other qualified health care professional(s) on the day of the encounter (includes time in activities that require the physician or other qualified  health care professional and does not include time in activities normally performed by clinical staff). Physician's time may include the following activities when performed: Preparing to see the patient (e.g., pre-charting review of records, searching for previously ordered imaging, lab work, and nerve conduction tests) Review of prior analgesic pharmacotherapies. Reviewing PMP Interpreting ordered tests (e.g., lab work, imaging, nerve conduction tests) Performing post-procedure evaluations, including interpretation of diagnostic procedures Obtaining and/or reviewing separately obtained history Performing a medically appropriate examination and/or evaluation Counseling and educating the patient/family/caregiver Ordering medications, tests, or procedures Referring and communicating with other health care professionals (when not separately reported) Documenting clinical information in the electronic or other health record Independently interpreting results (not separately reported) and communicating results to the patient/ family/caregiver Care coordination (not separately reported)  Note by: Gaspar Cola, MD Date: 02/26/2022; Time: 3:59 PM

## 2022-02-26 ENCOUNTER — Encounter: Payer: Self-pay | Admitting: Pain Medicine

## 2022-02-26 ENCOUNTER — Ambulatory Visit: Payer: Medicare Other | Attending: Pain Medicine | Admitting: Pain Medicine

## 2022-02-26 VITALS — BP 131/81 | HR 82 | Temp 98.2°F | Resp 16 | Ht 59.0 in | Wt 144.0 lb

## 2022-02-26 DIAGNOSIS — M25562 Pain in left knee: Secondary | ICD-10-CM | POA: Insufficient documentation

## 2022-02-26 DIAGNOSIS — G894 Chronic pain syndrome: Secondary | ICD-10-CM | POA: Diagnosis present

## 2022-02-26 DIAGNOSIS — G8929 Other chronic pain: Secondary | ICD-10-CM | POA: Diagnosis present

## 2022-02-26 DIAGNOSIS — M79605 Pain in left leg: Secondary | ICD-10-CM | POA: Diagnosis not present

## 2022-02-26 DIAGNOSIS — F1491 Cocaine use, unspecified, in remission: Secondary | ICD-10-CM | POA: Insufficient documentation

## 2022-02-26 DIAGNOSIS — F1991 Other psychoactive substance use, unspecified, in remission: Secondary | ICD-10-CM | POA: Diagnosis present

## 2022-02-26 DIAGNOSIS — M5442 Lumbago with sciatica, left side: Secondary | ICD-10-CM | POA: Insufficient documentation

## 2022-02-26 DIAGNOSIS — R892 Abnormal level of other drugs, medicaments and biological substances in specimens from other organs, systems and tissues: Secondary | ICD-10-CM | POA: Insufficient documentation

## 2022-02-26 DIAGNOSIS — E1142 Type 2 diabetes mellitus with diabetic polyneuropathy: Secondary | ICD-10-CM | POA: Insufficient documentation

## 2022-02-26 DIAGNOSIS — M79604 Pain in right leg: Secondary | ICD-10-CM | POA: Diagnosis not present

## 2022-02-26 DIAGNOSIS — S32030S Wedge compression fracture of third lumbar vertebra, sequela: Secondary | ICD-10-CM | POA: Insufficient documentation

## 2022-02-26 DIAGNOSIS — Z79899 Other long term (current) drug therapy: Secondary | ICD-10-CM | POA: Diagnosis present

## 2022-02-26 DIAGNOSIS — Z79891 Long term (current) use of opiate analgesic: Secondary | ICD-10-CM | POA: Diagnosis present

## 2022-02-26 DIAGNOSIS — M4316 Spondylolisthesis, lumbar region: Secondary | ICD-10-CM | POA: Diagnosis present

## 2022-02-26 NOTE — Patient Instructions (Signed)
____________________________________________________________________________________________  Patient Information update  To: All of our patients.  Re: Name change.  It has been made official that our current name, "[facility]"   will soon be changed to "[facility]".   The purpose of this change is to eliminate any confusion created by the concept of our practice being a "Medication Management Pain Clinic". In the past this has led to the misconception that we treat pain primarily by the use of prescription medications.  Nothing can be farther from the truth.   Understanding PAIN MANAGEMENT: To further understand what our practice does, you first have to understand that "Pain Management" is a subspecialty that requires additional training once a physician has completed their specialty training, which can be in either Anesthesia, Neurology, Psychiatry, or Physical Medicine and Rehabilitation (PMR). Each one of these contributes to the final approach taken by each physician to the management of their patient's pain. To be a "Pain Management Specialist" you must have first completed one of the specialty trainings below.  Anesthesiologists - trained in clinical pharmacology and interventional techniques such as nerve blockade and regional as well as central neuroanatomy. They are trained to block pain before, during, and after surgical interventions.  Neurologists - trained in the diagnosis and pharmacological treatment of complex neurological conditions, such as Multiple Sclerosis, Parkinson's, spinal cord injuries, and other systemic conditions that may be associated with symptoms that may include but are not limited to pain. They tend to rely primarily on the treatment of chronic pain using prescription medications.  Psychiatrist - trained in conditions affecting the psychosocial  wellbeing of patients including but not limited to depression, anxiety, schizophrenia, personality disorders, addiction, and other substance use disorders that may be associated with chronic pain. They tend to rely primarily on the treatment of chronic pain using prescription medications.   Physical Medicine and Rehabilitation (PMR) physicians, also known as physiatrists - trained to treat a wide variety of medical conditions affecting the brain, spinal cord, nerves, bones, joints, ligaments, muscles, and tendons. Their training is primarily aimed at treating patients that have suffered injuries that have caused severe physical impairment. Their training is primarily aimed at the physical therapy and rehabilitation of those patients. They may also work alongside orthopedic surgeons or neurosurgeons using their expertise in assisting surgical patients to recover after their surgeries.  INTERVENTIONAL PAIN MANAGEMENT is sub-subspecialty of Pain Management.  Our physicians are Board-certified in Anesthesia, Pain Management, and Interventional Pain Management.  This meaning that not only have they been trained and Board-certified in their specialty of Anesthesia, and subspecialty of Pain Management, but they have also received further training in the sub-subspecialty of Interventional Pain Management, in order to become Board-certified as INTERVENTIONAL PAIN MANAGEMENT SPECIALIST.    Mission: Our goal is to use our skills in  INTERVENTIONAL PAIN MANAGEMENT as alternatives to the chronic use of prescription opioid medications for the treatment of pain. To make this more clear, we have changed our name to reflect what we do and offer. We will continue to offer medication management assessment and recommendations, but we will not be taking over any patient's medication management.  ____________________________________________________________________________________________      ____________________________________________________________________________________________  Opioid Pain Medication Update  To: All patients taking opioid pain medications. (I.e.: hydrocodone, hydromorphone, oxycodone, oxymorphone, morphine, codeine, methadone, tapentadol, tramadol, buprenorphine, fentanyl, etc.)  Re: Updated review of side effects and adverse reactions of opioid analgesics, as well as new   information about long term effects of this class of medications.  Direct risks of long-term opioid therapy are not limited to opioid addiction and overdose. Potential medical risks include serious fractures, breathing problems during sleep, hyperalgesia, immunosuppression, chronic constipation, bowel obstruction, myocardial infarction, and tooth decay secondary to xerostomia.  Unpredictable adverse effects that can occur even if you take your medication correctly: Cognitive impairment, respiratory depression, and death. Most people think that if they take their medication "correctly", and "as instructed", that they will be safe. Nothing could be farther from the truth. In reality, a significant amount of recorded deaths associated with the use of opioids has occurred in individuals that had taken the medication for a long time, and were taking their medication correctly. The following are examples of how this can happen: Patient taking his/her medication for a long time, as instructed, without any side effects, is given a certain antibiotic or another unrelated medication, which in turn triggers a "Drug-to-drug interaction" leading to disorientation, cognitive impairment, impaired reflexes, respiratory depression or an untoward event leading to serious bodily harm or injury, including death.  Patient taking his/her medication for a long time, as instructed, without any side effects, develops an acute impairment of liver and/or kidney function. This will lead to a rapid inability of the body to  breakdown and eliminate their pain medication, which will result in effects similar to an "overdose", but with the same medicine and dose that they had always taken. This again may lead to disorientation, cognitive impairment, impaired reflexes, respiratory depression or an untoward event leading to serious bodily harm or injury, including death.  A similar problem will occur with patients as they grow older and their liver and kidney function begins to decrease as part of the aging process.  Background information: Historically, the original case for using long-term opioid therapy to treat chronic noncancer pain was based on safety assumptions that subsequent experience has called into question. In 1996, the American Pain Society and the [place] Academy of [employer] issued a consensus statement supporting long-term opioid therapy. This statement acknowledged the dangers of opioid prescribing but concluded that the risk for addiction was low; respiratory depression induced by opioids was short-lived, occurred mainly in opioid-naive patients, and was antagonized by pain; tolerance was not a common problem; and efforts to control diversion should not constrain opioid prescribing. This has now proven to be wrong. Experience regarding the risks for opioid addiction, misuse, and overdose in community practice has failed to support these assumptions.  According to the Centers for Disease Control and Prevention, fatal overdoses involving opioid analgesics have increased sharply over the past decade. Currently, more than 96,700 people die from drug overdoses every year. Opioids are a factor in 7 out of every 10 overdose deaths. Deaths from drug overdose have surpassed motor vehicle accidents as the leading cause of death for individuals between the ages of 18 and 54.  Clinical data suggest that neuroendocrine dysfunction may be very common in both men and women, potentially causing hypogonadism, erectile  dysfunction, infertility, decreased libido, osteoporosis, and depression. Recent studies linked higher opioid dose to increased opioid-related mortality. Controlled observational studies reported that long-term opioid therapy may be associated with increased risk for cardiovascular events. Subsequent meta-analysis concluded that the safety of long-term opioid therapy in elderly patients has not been proven.   Side Effects and adverse reactions: Common side effects: Drowsiness (sedation). Dizziness. Nausea and vomiting. Constipation. Physical dependence -- Dependence often manifests with withdrawal symptoms when opioids are discontinued  or decreased. Tolerance -- As you take repeated doses of opioids, you require increased medication to experience the same effect of pain relief. Respiratory depression -- This can occur in healthy people, especially with higher doses. However, people with COPD, asthma or other lung conditions may be even more susceptible to fatal respiratory impairment.  Uncommon side effects: An increased sensitivity to feeling pain and extreme response to pain (hyperalgesia). Chronic use of opioids can lead to this. Delayed gastric emptying (the process by which the contents of your stomach are moved into your small intestine). Muscle rigidity. Immune system and hormonal dysfunction. Quick, involuntary muscle jerks (myoclonus). Arrhythmia. Itchy skin (pruritus). Dry mouth (xerostomia).  Long-term side effects: Chronic constipation. Sleep-disordered breathing (SDB). Increased risk of bone fractures. Hypothalamic-pituitary-adrenal dysregulation. Increased risk of overdose.  RISKS: Fractures and Falls:  Opioids increase the risk and incidence of falls. This is of particular importance in elderly patients.  Endocrine System:  Long-term administration is associated with endocrine abnormalities. Influences on both the hypothalamic-pituitary-adrenal axis?and the  hypothalamic-pituitary-gonadal axis have been demonstrated with consequent hypogonadism and adrenal insufficiency in both sexes. Hypogonadism and decreased levels of dehydroepiandrosterone sulfate have been reported in men and women. Endocrine effects can lead to: Amenorrhoea in women Reduced libido in both sexes Erectile dysfunction in men Infertility Depression and fatigue Patients (particularly women of childbearing age) should avoid opioids. There is insufficient evidence to recommend routine monitoring of asymptomatic patients taking opioids in the long-term for hormonal deficiencies.  Immune System: Human studies have demonstrated that opioids have an immunomodulating effect. These effects are mediated via opioid receptors both on immune effector cells and in the central nervous system. Opioids have been demonstrated to have adverse effects on antimicrobial response and anti-tumour surveillance. Buprenorphine has been demonstrated to have no impact on immune function.  Opioid Induced Hyperalgesia: Human studies have demonstrated that prolonged use of opioids can lead to a state of abnormal pain sensitivity, sometimes called opioid induced hyperalgesia (OIH). Opioid induced hyperalgesia is not usually seen in the absence of tolerance to opioid analgesia. Clinically, hyperalgesia may be diagnosed if the patient on long-term opioid therapy presents with increased pain. This might be qualitatively and anatomically distinct from pain related to disease progression or to breakthrough pain resulting from development of opioid tolerance. Pain associated with hyperalgesia tends to be more diffuse than the pre-existing pain and less defined in quality. Management of opioid induced hyperalgesia requires opioid dose reduction.  Cancer: Chronic opioid therapy has been associated with an increased risk of cancer among noncancer patients with chronic pain. This association was more evident in chronic  strong opioid users. Chronic opioid consumption causes significant pathological changes in the small intestine and colon. Epidemiological studies have found that there is a link between opium dependence and initiation of gastrointestinal cancers. Cancer is the second leading cause of death after cardiovascular disease. Chronic use of opioids can cause multiple conditions such as GERD, immunosuppression and renal damage as well as carcinogenic effects, which are associated with the incidence of cancers.   Mortality: Long-term opioid use has been associated with increased mortality among patients with chronic non-cancer pain (CNCP).  Prescription of long-acting opioids for chronic noncancer pain was associated with a significantly increased risk of all-cause mortality, including deaths from causes other than overdose.  Reference: Von Korff M, Kolodny A, Deyo RA, Chou R. Long-term opioid therapy reconsidered. Ann Intern Med. 2011 Sep 6;155(5):325-8. doi: 10.7326/[id number]. PMID: 21893626; PMCID: PMC3280085. Bedson J, Chen Y, Ashworth J, Hayward RA, Dunn KM,   [place] KP. Risk of adverse events in patients prescribed long-term opioids: A cohort study in the [place] [employer]. Eur J Pain. 2019 May;23(5):908-922. doi: 10.1002/ejp.1357. Epub 2019 Jan 31. PMID: 43154008. Colameco S, Coren JS, Ciervo CA. Continuous opioid treatment for chronic noncancer pain: a time for moderation in prescribing. Postgrad Med. 2009 Jul;121(4):61-6. doi: 10.3810/pgm.2009.07.2032. PMID: 67619509. Heywood Bene RN, [place] SD, Blazina I, Rosalio Loud, Bougatsos C, Deyo RA. The effectiveness and risks of long-term opioid therapy for chronic pain: a systematic review for a [employer] of [employer] Pathways to [employer]. Ann Intern Med. 2015 Feb 17;162(4):276-86. doi: 32.6712/W58-0998. PMID: 33825053. Marjory Sneddon [facility], [place] DM. NCHS Data Brief No. 22. [place]:  Centers for Disease Control and Prevention; 2009. Sep, Increase in Fatal Poisonings Involving Opioid Analgesics in the [place], 1999-2006. Song IA, Choi HR, Oh TK. Long-term opioid use and mortality in patients with chronic non-cancer pain: Ten-year follow-up study in [place] from 2010 through 2019. EClinicalMedicine. 2022 Jul 18;51:101558. doi: 10.1016/j.eclinm.[id number]. PMID: 19379024; PMCID: [id number]. Huser, W., Schubert, T., Vogelmann, T. et al. All-cause mortality in patients with long-term opioid therapy compared with non-opioid analgesics for chronic non-cancer pain: a database study. [place] Med 18, 162 (2020). [email or web address]/ Rashidian H, Roxy Cedar, Malekzadeh R, Haghdoost AA. An Ecological Study of the Association between Opiate Use and Incidence of Cancers. Addict Health. 2016 Fall;8(4):252-260. PMID: 42683419; PMCID: [id number].  Our Goal: Our goal is to control your pain with means other than the use of opioid pain medications.  Our Recommendation: Talk to your physician about coming off of these medications. We can assist you with the tapering down and stopping these medicines. Based on the new information, even if you cannot completely stop the medication, a decrease in the dose may be associated with a lesser risk. Ask for other means of controlling the pain. Decrease or eliminate those factors that significantly contribute to your pain such as smoking, obesity, and a diet heavily tilted towards "inflammatory" nutrients.  ____________________________________________________________________________________________

## 2022-02-26 NOTE — Progress Notes (Signed)
Safety precautions to be maintained throughout the outpatient stay will include: orient to surroundings, keep bed in low position, maintain call bell within reach at all times, provide assistance with transfer out of bed and ambulation.  

## 2022-03-01 LAB — TOXASSURE SELECT 13 (MW), URINE

## 2022-03-11 ENCOUNTER — Telehealth: Payer: Self-pay | Admitting: Pain Medicine

## 2022-03-11 NOTE — Telephone Encounter (Signed)
Looks like there is a prn order in for knee but nothing for back

## 2022-03-11 NOTE — Telephone Encounter (Signed)
PT called to see if doctor had put in an order to get an injection done in her left knee and back. Please give patient a call. TY

## 2022-03-18 ENCOUNTER — Encounter: Payer: Self-pay | Admitting: Pain Medicine

## 2022-03-18 ENCOUNTER — Ambulatory Visit: Payer: Medicare Other | Attending: Pain Medicine | Admitting: Pain Medicine

## 2022-03-18 VITALS — BP 165/92 | HR 76 | Temp 97.5°F | Resp 14 | Ht 59.0 in | Wt 145.0 lb

## 2022-03-18 DIAGNOSIS — G8929 Other chronic pain: Secondary | ICD-10-CM | POA: Insufficient documentation

## 2022-03-18 DIAGNOSIS — M1712 Unilateral primary osteoarthritis, left knee: Secondary | ICD-10-CM

## 2022-03-18 DIAGNOSIS — M25562 Pain in left knee: Secondary | ICD-10-CM | POA: Diagnosis present

## 2022-03-18 MED ORDER — ROPIVACAINE HCL 2 MG/ML IJ SOLN
INTRAMUSCULAR | Status: AC
  Filled 2022-03-18: qty 20

## 2022-03-18 MED ORDER — LIDOCAINE HCL (PF) 2 % IJ SOLN
INTRAMUSCULAR | Status: AC
  Filled 2022-03-18: qty 5

## 2022-03-18 MED ORDER — HYALURONAN 88 MG/4ML IX SOSY
88.0000 mg | PREFILLED_SYRINGE | Freq: Once | INTRA_ARTICULAR | Status: AC
  Administered 2022-03-18: 88 mg via INTRA_ARTICULAR

## 2022-03-18 MED ORDER — ROPIVACAINE HCL 2 MG/ML IJ SOLN
5.0000 mL | Freq: Once | INTRAMUSCULAR | Status: AC
  Administered 2022-03-18: 5 mL via INTRA_ARTICULAR

## 2022-03-18 MED ORDER — PENTAFLUOROPROP-TETRAFLUOROETH EX AERO
INHALATION_SPRAY | Freq: Once | CUTANEOUS | Status: AC
  Administered 2022-03-18: 30 via TOPICAL
  Filled 2022-03-18: qty 116

## 2022-03-18 MED ORDER — LIDOCAINE HCL (PF) 2 % IJ SOLN
5.0000 mL | Freq: Once | INTRAMUSCULAR | Status: AC
  Administered 2022-03-18: 5 mL

## 2022-03-18 NOTE — Progress Notes (Signed)
PROVIDER NOTE: Interpretation of information contained herein should be left to medically-trained personnel. Specific patient instructions are provided elsewhere under "Patient Instructions" section of medical record. This document was created in part using STT-dictation technology, any transcriptional errors that may result from this process are unintentional.  Patient: Charlene [name] Type: Established DOB: November 30, 1960 MRN: [id number] PCP: Tracie Harrier, MD  Service: Procedure DOS: 03/18/2022 Setting: Ambulatory Location: Ambulatory outpatient facility Delivery: Face-to-face Provider: Gaspar Cola, MD Specialty: Interventional Pain Management Specialty designation: 09 Location: Outpatient facility Ref. Prov.: Tracie Harrier, MD       Interventional Therapy   Procedure:           Type: Monovisc Intra-articular Knee Injection #1  Laterality: Left (-LT) Level/approach: Superior ([place]-Femoral) Imaging guidance: None required [id number]) Anesthesia: Local anesthesia (1-2% Lidocaine) Anxiolysis: None                 Sedation: No Sedation                       DOS: 03/18/2022  Performed by: Gaspar Cola, MD  Purpose: Diagnostic/Therapeutic Indications: Knee arthralgia associated to osteoarthritis of the knee 1. Pain of patellofemoral joint (Left)   2. Chronic knee pain (Left)   3. Osteoarthritis of knee (Left)    NAS-11 score:   Pre-procedure: 2 /10   Post-procedure: 1  (standing/walking)/10     Pre-Procedure Preparation  Monitoring: As per clinic protocol.  Risk Assessment: Vitals:  [id number] body mass index is 29.29 kg/m as calculated from the following:   Height as of this encounter: '4\' 11"'$  (1.499 m).   Weight as of this encounter: 145 lb (65.8 kg)., Rate:76 , BP:(!) 165/92, Resp:14, Temp:(!) 97.5 F (36.4 C), SpO2:100 %  Allergies: She is allergic to semaglutide.  Precautions: No additional precautions required  Blood-thinner(s): None at this  time  Coagulopathies: Reviewed. None identified.   Active Infection(s): Reviewed. None identified. Charlene [name] is afebrile   Location setting: Exam room Position: Sitting w/ knee bent 90 degrees Safety Precautions: Patient was assessed for positional comfort and pressure points before starting the procedure. Prepping solution: DuraPrep (Iodine Povacrylex [0.7% available iodine] and Isopropyl Alcohol, 74% w/w) Prep Area: Entire knee region Approach: percutaneous, just above the tibial plateau, lateral to the infrapatellar tendon. Intended target: Intra-articular knee space Materials: Tray: Block Needle(s): Regular Qty: 1/side Length: 1.5-inch Gauge: 25G (x1) + 22G (x1)  Meds ordered this encounter  Medications   pentafluoroprop-tetrafluoroeth (GEBAUERS) aerosol   Hyaluronan (MONOVISC) intra-articular injection 88 mg    Do not substitute for formulary alternative without first contacting physician for approval. Deliver to facility day before procedure.   lidocaine HCl (PF) (XYLOCAINE) 2 % injection 5 mL   ropivacaine (PF) 2 mg/mL (0.2%) (NAROPIN) injection 5 mL    Orders Placed This Encounter  Procedures   KNEE INJECTION    Indications: Knee arthralgia (pain) due to osteoarthritis (OA) Imaging: None ([id number]) Position: Sitting Equipment/Materials: Block tray  1.5", 25-G (one per side)  Local anesthetic  Monovisc (one per side) Confirm availability (in office) of Monovisc (HMW hyaluronan)    Scheduling Instructions:     Procedure: Knee injection Monovisc (Hyaluronan/Hyaluronic acid)     Treatment No.: 1     Level: Intra-articular     Laterality: Left Knee     Sedation: Patient's choice.    Order Specific Question:   Where will this procedure be performed?    Answer:   [facility]   Informed  Consent Details: Physician/Practitioner Attestation; Transcribe to consent form and obtain patient signature    Nursing Order: Transcribe to consent form and obtain patient  signature. Note: Always confirm laterality of pain with Charlene [name], before procedure.    Order Specific Question:   Physician/Practitioner attestation of informed consent for procedure/surgical case    Answer:   I, the physician/practitioner, attest that I have discussed with the patient the benefits, risks, side effects, alternatives, likelihood of achieving goals and potential problems during recovery for the procedure that I have provided informed consent.    Order Specific Question:   Procedure    Answer:   Therapeutic intra-articular viscosupplementation knee injection    Order Specific Question:   Physician/Practitioner performing the procedure    Answer:   Mickal Meno A. Dossie Arbour, MD    Order Specific Question:   Indication/Reason    Answer:   Chronic knee pain secondary to primary osteoarthritis of the knee  (Right-M17.11), (Left-M17.12) or (Bilateral-M17.0)   Provide equipment / supplies at bedside    Procedure tray: "Block Tray" (Disposable  single use) Skin infiltration needle: Regular 1.5-in, 25-G, (x1) Block Needle type: Regular Amount/quantity: 1 Size: Short(1.5-inch) Gauge: 22G    Standing Status:   Standing    Number of Occurrences:   1    Order Specific Question:   Specify    Answer:   Block Tray     Time-out: [id number] I initiated and conducted the "Time-out" before starting the procedure, as per protocol. The patient was asked to participate by confirming the accuracy of the "Time Out" information. Verification of the correct person, site, and procedure were performed and confirmed by me, the nursing staff, and the patient. "Time-out" conducted as per Joint Commission's Universal Protocol (UP.01.01.01). Procedure checklist: Completed  H&P (Pre-op  Assessment)  Charlene [name] is a 62 y.o. (year old), female patient, seen today for interventional treatment. She  has a past surgical history that includes arthroscopic rotator cuff; Colonoscopy; Abdominal hysterectomy; endosopic sinus;  and Cataract extraction w/PHACO (Right, 11/25/2017). Charlene [name] has a current medication list which includes the following prescription(s): amlodipine, carvedilol, cholecalciferol, freestyle libre sensor system, dicyclomine, duloxetine, hydralazine, toujeo max solostar, insulin lispro, lidocaine, lisinopril, metformin, nortriptyline, pantoprazole, polyethylene glycol, pregabalin, sucralfate, tramadol, baclofen, magnesium, and pravastatin. Her primarily concern today is the Knee Pain (left)  She is allergic to semaglutide.   Last encounter: My last encounter with her was on 03/11/2022. 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[place]); Chronic ankle pain (Left); Chronic foot pain (Left); Chronic lower extremity pain (2ry area of Pain) (Bilateral) (L>R); Chronic knee pain (4th area of Pain) (Bilateral) (L>R); Chronic low back pain (3ry area of Pain) (Bilateral) (L>R) w/ sciatica (Left); Chronic pain syndrome; DM type 2 with diabetic peripheral neuropathy (HCC); DDD (degenerative disc disease), lumbar; Lumbar facet arthropathy; Lumbar facet syndrome; Lumbar facet hypertrophy; Lumbar foraminal stenosis (L5-S1) (Right); Osteoarthritis of knee (Bilateral); Chronic musculoskeletal pain; Abnormal EMG (07/06/2017); Osteoarthritis involving multiple joints; 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[place]); Knee pain; Back pain with left-sided sciatica; Meningioma ([place]); Neck pain; Chronic pain disorder; Left leg weakness; Meralgia paraesthetica, left; Pain in both lower extremities; Arthropathy of lumbar facet joint; Chronic ankle and foot pain (1ry area of Pain) (Left); 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 Pain Assessment: Severity of Chronic pain is reported as a 2 /10. Location: Knee Left/denies. Onset: More than a month ago. Quality: Throbbing. Timing: Intermittent. Modifying factor(s): injections, medications. Vitals:  height is '4\' 11"'$  (1.499 m) and weight is 145 lb (65.8 kg). Her temporal temperature is 97.5 F (36.4 C) (abnormal). Her blood pressure is 165/92 (abnormal) and her pulse is 76. Her respiration is 14 and oxygen saturation is 100%.   Reason for encounter: "interventional pain management therapy due pain of at least four (4) weeks in duration, with failure to respond and/or inability to tolerate more conservative care.  Site Confirmation: Charlene [name] was asked to confirm the procedure and laterality before marking the site.  Consent: Before the procedure and under the influence of no sedative(s), amnesic(s), or anxiolytics, the patient was informed of the treatment options, risks and possible complications. To fulfill our ethical and legal obligations, as recommended by the American Medical Association's Code of Ethics, I have informed the patient of my clinical impression; the nature and purpose of the treatment or procedure; the risks, benefits, and possible complications of the intervention; the alternatives, including doing nothing; the risk(s) and benefit(s) of the alternative treatment(s) or procedure(s); and the risk(s) and benefit(s) of doing nothing. The patient was provided information about the general risks and possible complications associated with the procedure. These may include, but are not  limited to: failure to achieve desired goals, infection, bleeding, organ or nerve damage, allergic reactions, paralysis, and death. In addition, the patient was informed of those risks and complications associated to Spine-related procedures, such as failure to decrease pain; infection (i.e.: Meningitis, epidural or intraspinal abscess); bleeding (i.e.: epidural hematoma, subarachnoid hemorrhage, or any other type of intraspinal or peri-dural bleeding); organ or nerve damage (i.e.: Any type of peripheral nerve, nerve root, or spinal cord injury) with subsequent damage to sensory, motor, and/or autonomic systems, resulting in permanent pain, numbness, and/or weakness of one or several areas of the body; allergic reactions; (i.e.: anaphylactic reaction); and/or death. Furthermore, the patient was informed of those risks and complications associated with the medications. These include, but are not limited to: allergic reactions (i.e.: anaphylactic or anaphylactoid reaction(s)); adrenal axis suppression; blood sugar elevation that in diabetics may result in ketoacidosis or comma; water retention that in patients with history of congestive heart failure may result in shortness of breath, pulmonary edema, and decompensation with resultant heart failure; weight gain; swelling or edema; medication-induced neural toxicity; particulate matter embolism and blood vessel occlusion with resultant organ, and/or nervous system infarction; and/or aseptic necrosis of one or more joints. Finally, the patient was informed that Medicine is not an exact science; therefore, there is also the possibility of unforeseen or unpredictable risks and/or possible complications that may result in a catastrophic outcome. The patient indicated having understood very clearly. We have given the patient no guarantees and we have made no promises. Enough time was given to the patient to ask questions, all of which were answered to the patient's  satisfaction. Charlene [name] has indicated that she wanted to continue with the procedure. Attestation: I, the ordering provider, attest that I have discussed with the patient the benefits, risks, side-effects, alternatives, likelihood of achieving  goals, and potential problems during recovery for the procedure that I have provided informed consent.  Date  Time: 03/18/2022 10:07 AM  Description of procedure  Start Time: 1025 hrs  Local Anesthesia: Once the patient was positioned, prepped, and time-out was completed. The target area was identified located. The skin was marked with an approved surgical skin marker. Once marked, the skin (epidermis, dermis, and hypodermis), and deeper tissues (fat, connective tissue and muscle) were infiltrated with a small amount of a short-acting local anesthetic, loaded on a 10cc syringe with a 25G, 1.5-in  Needle. An appropriate amount of time was allowed for local anesthetics to take effect before proceeding to the next step. Local Anesthetic: Lidocaine 1-2% The unused portion of the local anesthetic was discarded in the proper designated containers. Safety Precautions: Aspiration looking for blood return was conducted prior to all injections. At no point did I inject any substances, as a needle was being advanced. Before injecting, the patient was told to immediately notify me if she was experiencing any new onset of "ringing in the ears, or metallic taste in the mouth". No attempts were made at seeking any paresthesias. Safe injection practices and needle disposal techniques used. Medications properly checked for expiration dates. SDV (single dose vial) medications used. After the completion of the procedure, all disposable equipment used was discarded in the proper designated medical waste containers.  Technical description: Protocol guidelines were followed. After positioning, the target area was identified and prepped in the usual manner. Skin & deeper tissues  infiltrated with local anesthetic. Appropriate amount of time allowed to pass for local anesthetics to take effect. Proper needle placement secured. Once satisfactory needle placement was confirmed, I proceeded to inject the desired solution in slow, incremental fashion, intermittently assessing for discomfort or any signs of abnormal or undesired spread of substance. Once completed, the needle was removed and disposed of, as per hospital protocols. The area was cleaned, making sure to leave some of the prepping solution back to take advantage of its long term bactericidal properties.  Aspiration:  Negative        Vitals:   03/18/22 1006  BP: (!) 165/92  Pulse: 76  Resp: 14  Temp: (!) 97.5 F (36.4 C)  TempSrc: Temporal  SpO2: 100%  Weight: 145 lb (65.8 kg)  Height: '4\' 11"'$  (1.499 m)    End Time: 1029 hrs  Imaging guidance  Imaging-assisted Technique: None required. Indication(s): N/A Exposure Time: N/A Contrast: None Fluoroscopic Guidance: N/A Ultrasound Guidance: N/A Interpretation: N/A  Post-op assessment  Post-procedure Vital Signs:  Pulse/HCG Rate: 76  Temp: (!) 97.5 F (36.4 C) Resp: 14 BP: (!) 165/92 SpO2: 100 %  EBL: None  Complications: No immediate post-treatment complications observed by team, or reported by patient.  Note: The patient tolerated the entire procedure well. A repeat set of vitals were taken after the procedure and the patient was kept under observation following institutional policy, for this type of procedure. Post-procedural neurological assessment was performed, showing return to baseline, prior to discharge. The patient was provided with post-procedure discharge instructions, including a section on how to identify potential problems. Should any problems arise concerning this procedure, the patient was given instructions to immediately contact us, at any time, without hesitation. In any case, we plan to contact the patient by telephone for a  follow-up status report regarding this interventional procedure.  Comments:  No additional relevant information.  Plan of care  Chronic Opioid Analgesic:  Tramadol 50 mg, 1 tab PO  QD. ABNORMAL UDS (09/03/2020) (+) for COCAINE. MME: 5 mg/day   Medications administered: We administered pentafluoroprop-tetrafluoroeth, Hyaluronan, lidocaine HCl (PF), and ropivacaine (PF) 2 mg/mL (0.2%).  Follow-up plan:   Return in about 2 weeks (around 04/01/2022) for Proc-day (T,Th), (VV), (PPE).      Interventional Therapies  Risk Factors  Considerations:   NOTE: UNCONTROLLED IDDM (NO STEROIDS)  ABNORMAL UDS (09/03/2020) (+) for COCAINE       Planned  Pending:  (NO STEROIDS)  Therapeutic left IA Gelsyn-3 knee inj. #3 + left L3-4 LESI #1 (w/o steroids)    Under consideration:   Possible bilateral lumbar facet RFA  Diagnostic bilateral genicular NB    Completed:   Diagnostic right lumbar facet MBB x3 (no steroid) (03/26/2021) (100/100/50/50)  Diagnostic left lumbar facet MBB x1 (w/ steroid) (10/09/2020) (100/100/90/L-100; R-50)  Therapeutic left L5 TFESI x4 (09/21/2018) (100/100/50/70)  Therapeutic left L4-5 LESI x4 (09/21/2018) (100/100/50/70)  Therapeutic left L5-S1 LESI x2 (02/25/2018) (100/100/100/100)  Therapeutic bilateral Hyalgan knee inj. x5 (02/25/2018) (100/100/100 x2 weeks/<50)  Therapeutic left IA steroid knee injection x1 (11/23/2017) (100/100/0/0)  Therapeutic left IA Gelsyn-3 knee inj. x2    Therapeutic  Palliative (PRN) options:  (NO PRN PROCEDURES W/O MD F2F EVALUATION) Palliative L5 TFESI  Palliative L4-5 LESI   Palliative L5-S1 LESI   Palliative Hyalgan knee inj.        Recent Visits Date Type Provider Dept  02/26/22 Office Visit Milinda Pointer, MD [facility]  02/11/22 Procedure visit Milinda Pointer, MD [facility]  01/21/22 Office Visit Milinda Pointer, MD [facility]  12/31/21 Procedure visit Milinda Pointer, MD [facility]  Showing recent visits within past 90 days and meeting all other requirements Today's Visits Date Type Provider Dept  03/18/22 Procedure visit Milinda Pointer, MD [facility]  Showing today's visits and meeting all other requirements Future Appointments Date Type Provider Dept  04/01/22 Appointment Milinda Pointer, MD [facility]  04/23/22 Appointment Milinda Pointer, MD [facility]  Showing future appointments within next 90 days and meeting all other requirements   Disposition: Discharge home  Discharge (Date  Time): 03/18/2022; 1030 hrs.   Primary Care Physician: Tracie Harrier, MD Location: [facility] Note by: Gaspar Cola, MD Date: 03/18/2022; Time: 11:55 AM  DISCLAIMER: Medicine is not an Chief Strategy Officer. It has no guarantees or warranties. The decision to proceed with this intervention was based on the information collected from the patient. Conclusions were drawn from the patient's questionnaire, interview, and examination. Because information was provided in large part by the patient, it cannot be guaranteed that it has not been purposely or unconsciously manipulated or altered. Every effort has been made to obtain as much accurate, relevant, available data as possible. Always take into account that the treatment will also be dependent on availability of resources and existing treatment guidelines, considered by other Pain Management Specialists as being common knowledge and practice, at the time of the intervention. It is also important to point out that variation in procedural techniques and pharmacological choices are the acceptable norm. For Medico-Legal review purposes, the indications, contraindications, technique, and results of the these procedures should only be evaluated, judged and interpreted by a Board-Certified Interventional Pain Specialist with extensive familiarity and expertise in the  same exact procedure and technique.

## 2022-03-18 NOTE — Patient Instructions (Addendum)
  site. How long: (15 min on, 15 min off) Apply for 15 minutes then remove x 15 minutes.  Repeat sequence on day of procedure, until you go to  bed. Apply heat:  Purpose: To treat any soreness and discomfort from the procedure. When: Starting the next day after the procedure. How: Apply heat to procedure site starting the day following the procedure. How long: May continue to repeat daily, until discomfort goes away. Food intake: Start with clear liquids (like water) and advance to regular food, as tolerated.  Physical activities: Keep activities to a minimum for the first 8 hours after the procedure. After that, then as tolerated. Driving: If you have received any sedation, be responsible and do not drive. You are not allowed to drive for 24 hours after having sedation. Blood thinner: (Applies only to those taking blood thinners) You may restart your blood thinner 6 hours after your procedure. Insulin: (Applies only to Diabetic patients taking insulin) As soon as you can eat, you may resume your normal dosing schedule. Infection prevention: Keep procedure site clean and dry. Shower daily and clean area with soap and water. Post-procedure Pain Diary: Extremely important that this be done correctly and accurately. Recorded information will be used to determine the next step in treatment. For the purpose of accuracy, follow these rules: Evaluate only the area treated. Do not report or include pain from an untreated area. For the purpose of this evaluation, ignore all other areas of pain, except for the treated area. After your procedure, avoid taking a long nap and attempting to complete the pain diary after you wake up. Instead, set your alarm clock to go off every hour, on the hour, for the initial 8 hours after the procedure. Document the duration of the numbing medicine, and the relief you are getting from it. Do not go to sleep and attempt to complete it later. It will not be accurate. If you received sedation, it is likely that you were given a medication that may cause amnesia. Because of this, completing the diary at a later time may  cause the information to be inaccurate. This information is needed to plan your care. Follow-up appointment: Keep your post-procedure follow-up evaluation appointment after the procedure (usually 2 weeks for most procedures, 6 weeks for radiofrequencies). DO NOT FORGET to bring you pain diary with you.   Expect: (What should I expect to see with my procedure?) From numbing medicine (AKA: Local Anesthetics): Numbness or decrease in pain. You may also experience some weakness, which if present, could last for the duration of the local anesthetic. Onset: Full effect within 15 minutes of injected. Duration: It will depend on the type of local anesthetic used. On the average, 1 to 8 hours.   From procedure: Some discomfort is to be expected once the numbing medicine wears off. This should be minimal if ice and heat are applied as instructed.  Call if: (When should I call?) You experience numbness and weakness that gets worse with time, as opposed to wearing off. New onset bowel or bladder incontinence. (Applies only to procedures done in the spine)  Emergency Numbers: Durning business hours (Monday - Thursday, 8:00 AM - 4:00 PM) (Friday, 9:00 AM - 12:00 Noon): [phone] After hours: [phone] NOTE: If you are having a problem and are unable connect with, or to talk to a provider, then go to your nearest urgent care or emergency department. If the problem is serious and urgent, please call 911. ____________________________________________________________________________________________

## 2022-03-19 ENCOUNTER — Telehealth: Payer: Self-pay | Admitting: *Deleted

## 2022-03-19 MED FILL — Hyaluronan Intra-articular Soln Prefilled Syringe 88 MG/4ML: INTRA_ARTICULAR | Qty: 4 | Status: AC

## 2022-03-19 MED FILL — Hyaluronan Intra-articular Soln Prefilled Syringe 88 MG/4ML: INTRA_ARTICULAR | Qty: 4 | Status: CN

## 2022-03-19 NOTE — Telephone Encounter (Signed)
No problems post procedure. 

## 2022-03-20 ENCOUNTER — Other Ambulatory Visit: Payer: Self-pay | Admitting: Internal Medicine

## 2022-03-20 DIAGNOSIS — Z1231 Encounter for screening mammogram for malignant neoplasm of breast: Secondary | ICD-10-CM

## 2022-04-01 ENCOUNTER — Ambulatory Visit: Payer: Medicare Other | Attending: Pain Medicine | Admitting: Pain Medicine

## 2022-04-01 DIAGNOSIS — M1712 Unilateral primary osteoarthritis, left knee: Secondary | ICD-10-CM | POA: Diagnosis not present

## 2022-04-01 DIAGNOSIS — M25562 Pain in left knee: Secondary | ICD-10-CM | POA: Diagnosis not present

## 2022-04-01 DIAGNOSIS — G8929 Other chronic pain: Secondary | ICD-10-CM

## 2022-04-01 DIAGNOSIS — G894 Chronic pain syndrome: Secondary | ICD-10-CM

## 2022-04-01 NOTE — Progress Notes (Signed)
Patient: Charlene [name]  Service Category: E/M  Provider: Gaspar Cola, MD  DOB: 04/04/1960  DOS: 04/01/2022  Location: Office  MRN: [id number]  Setting: Ambulatory outpatient  Referring Provider: Tracie Harrier, MD  Type: Established Patient  Specialty: Interventional Pain Management  PCP: Tracie Harrier, MD  Location: Remote location  Delivery: TeleHealth     Virtual Encounter - Pain Management PROVIDER NOTE: Information contained herein reflects review and annotations entered in association with encounter. Interpretation of such information and data should be left to medically-trained personnel. Information provided to patient can be located elsewhere in the medical record under "Patient Instructions". Document created using STT-dictation technology, any transcriptional errors that may result from process are unintentional.    Contact & Pharmacy Preferred: [phone] Home: [phone] (home) Mobile: There is no such number on file (mobile). E-mail: [email or web address] .[place], [name] - [address] [name] [address] Phone: [phone] Fax: [phone]   Pre-screening  Ms. Bresnick offered "in-person" vs "virtual" encounter. She indicated preferring virtual for this encounter.   Reason COVID-19*  Social distancing based on CDC and AMA recommendations.   I contacted Charlene [name] on 04/01/2022 via telephone.      I clearly identified myself as Gaspar Cola, MD. I verified that I was speaking with the correct person using two identifiers (Name: DONSHA MEHOK, and date of birth: [birth year]).  Consent I sought verbal advanced consent from Charlene [name] for virtual visit interactions. I informed Ms. Holdt of possible security and privacy concerns, risks, and limitations associated with providing "not-in-person" medical evaluation and management services. I also informed Ms. Detty of the availability of  "in-person" appointments. Finally, I informed her that there would be a charge for the virtual visit and that she could be  personally, fully or partially, financially responsible for it. Ms. Hutmacher expressed understanding and agreed to proceed.   Historic Elements   Ms. TRUDE LUCKEN is a 62 y.o. year old, female patient evaluated today after our last contact on 03/18/2022. Ms. Charo  has a past medical history of Arthritis, Degenerative disc disease, lumbar, Diabetes mellitus without complication ([place]), GERD (gastroesophageal reflux disease), Hypertension, Neuropathy, Polyneuropathy, and Polyneuropathy. She also  has a past surgical history that includes arthroscopic rotator cuff; Colonoscopy; Abdominal hysterectomy; endosopic sinus; and Cataract extraction w/PHACO (Right, 11/25/2017). Ms. Ricardo has a current medication list which includes the following prescription(s): amlodipine, carvedilol, cholecalciferol, freestyle libre sensor system, dicyclomine, duloxetine, hydralazine, toujeo max solostar, insulin lispro, lidocaine, lisinopril, metformin, nortriptyline, pantoprazole, polyethylene glycol, pravastatin, pregabalin, sucralfate, tramadol, baclofen, and magnesium. She  reports that she has never smoked. She has never used smokeless tobacco. She reports that she does not currently use alcohol. She reports that she does not use drugs. Ms. Katona is allergic to semaglutide.  BMI: Estimated body mass index is 29.29 kg/m as calculated from the following:   Height as of 03/18/22: '4\' 11"'$  (1.499 m).   Weight as of 03/18/22: 145 lb (65.8 kg). Last encounter: 02/26/2022. Last procedure: 03/18/2022.  HPI  Today, she is being contacted for a post-procedure assessment.  Post-procedure evaluation   Type: Monovisc Intra-articular Knee Injection #1  Laterality: Left (-LT) Level/approach: Superior ([place]ello-Femoral) Imaging guidance: None required UK:[id number]) Anesthesia: Local anesthesia (1-2%  Lidocaine) Anxiolysis: None                 Sedation: No Sedation  DOS: 03/18/2022  Performed by: Gaspar Cola, MD  Purpose: Diagnostic/Therapeutic Indications: Knee arthralgia associated to osteoarthritis of the knee 1. Pain of patellofemoral joint (Left)   2. Chronic knee pain (Left)   3. Osteoarthritis of knee (Left)    NAS-11 score:   Pre-procedure: 2 /10   Post-procedure: 1  (standing/walking)/10     Effectiveness:  Initial hour after procedure: 100 %. Subsequent 4-6 hours post-procedure: 100 %. Analgesia past initial 6 hours: 50 % (ongoing). Ongoing improvement:  Analgesic: The patient indicates having attained 100% relief of the pain for the duration of the local anesthetic followed by a decrease to 1 ongoing 50% improvement.  She describes that the pain has switched from being constant to now being intermittent.  Currently she states that she is not having any pain but when she does have the pain she describes that it is usually right over the area of the knee.  She cannot localize the pain to any one of the compartments.  She denies pain in the medial, lateral, and superior aspect of the knee.  She also denies any pain in the posterior aspect of the knee. Function: Somewhat improved ROM: Somewhat improved  Pharmacotherapy Assessment   Opioid Analgesic: Tramadol 50 mg, 1 tab PO QD. ABNORMAL UDS (09/03/2020) (+) for COCAINE. MME: 5 mg/day   Monitoring: [place] PMP: PDMP reviewed during this encounter.       Pharmacotherapy: No side-effects or adverse reactions reported. Compliance: No problems identified. Effectiveness: Clinically acceptable. Plan: Refer to "POC". UDS:  Summary  Date Value Ref Range Status  02/26/2022 Note  Final    Comment:    ==================================================================== [employer] Select 13 (MW) ==================================================================== Test                             Result        Flag       Units  Drug Present and Declared for Prescription Verification   Tramadol                       4758         EXPECTED   ng/mg creat   O-Desmethyltramadol            >6173        EXPECTED   ng/mg creat   N-Desmethyltramadol            1183         EXPECTED   ng/mg creat    Source of tramadol is a prescription medication. O-desmethyltramadol    and N-desmethyltramadol are expected metabolites of tramadol.  ==================================================================== Test                      Result    Flag   Units      Ref Range   Creatinine              81               mg/dL      >=20 ==================================================================== Declared Medications:  The flagging and interpretation on this report are based on the  following declared medications.  Unexpected results may arise from  inaccuracies in the declared medications.   **Note: The testing scope of this panel includes these medications:   Tramadol (Ultram)   **Note: The testing scope of this panel does not include the  following reported medications:   Amlodipine (  Norvasc)  Baclofen (Lioresal)  Carvedilol (Coreg)  Cholecalciferol  Dicyclomine (Bentyl)  Duloxetine (Cymbalta)  Hydralazine (Apresoline)  Insulin (Toujeo)  Lisinopril (Zestril)  Magnesium  Metformin (Glucophage)  Nortriptyline (Pamelor)  Pantoprazole (Protonix)  Polyethylene Glycol (MiraLAX)  Pravastatin (Pravachol)  Pregabalin (Lyrica)  Sucralfate (Carafate)  Topical Lidocaine (Lidoderm) ==================================================================== For clinical consultation, please call [phone]. ====================================================================    No results found for: "CBDTHCR", "D8THCCBX", "D9THCCBX"   Laboratory Chemistry Profile   Renal Lab Results  Component Value Date   BUN 13 12/23/2018   CREATININE 0.59 12/23/2018   BCR 17 10/12/2017   GFRAA >60 12/23/2018    GFRNONAA >60 12/23/2018    Hepatic Lab Results  Component Value Date   AST 17 10/21/2018   ALT 21 10/21/2018   ALBUMIN 3.6 10/21/2018   ALKPHOS 81 10/21/2018   LIPASE 20 10/21/2018    Electrolytes Lab Results  Component Value Date   NA 137 12/23/2018   K 4.1 12/23/2018   CL 100 12/23/2018   CALCIUM 9.8 12/23/2018   MG 2.0 10/12/2017    Bone Lab Results  Component Value Date   25OHVITD1 17 (L) 10/12/2017   25OHVITD2 <1.0 10/12/2017   25OHVITD3 17 10/12/2017    Inflammation (CRP: Acute Phase) (ESR: Chronic Phase) Lab Results  Component Value Date   CRP 4 10/12/2017   ESRSEDRATE 12 10/12/2017         Note: Above Lab results reviewed.  Imaging  DG PAIN CLINIC C-ARM 1-60 MIN NO REPORT Fluoro was used, but no Radiologist interpretation will be provided.  Please refer to "NOTES" tab for provider progress note.  Assessment  The primary encounter diagnosis was Pain of patellofemoral joint (Left). Diagnoses of Chronic knee pain (Left), Osteoarthritis of knee (Left), and Chronic pain syndrome were also pertinent to this visit.  Plan of Care  Problem-specific:  No problem-specific Assessment & Plan notes found for this encounter.  Ms. VERMELL COLLUM has a current medication list which includes the following long-term medication(s): amlodipine, carvedilol, cholecalciferol, dicyclomine, duloxetine, hydralazine, toujeo max solostar, insulin lispro, lisinopril, nortriptyline, pantoprazole, pravastatin, pregabalin, sucralfate, tramadol, baclofen, and magnesium.  Pharmacotherapy (Medications Ordered): No orders of the defined types were placed in this encounter.  Orders:  No orders of the defined types were placed in this encounter.  Follow-up plan:   Return for scheduled encounter.      Interventional Therapies  Risk Factors  Considerations:   NOTE: UNCONTROLLED IDDM (NO STEROIDS)  ABNORMAL UDS (09/03/2020) (+) for COCAINE       Planned  Pending:  (NO STEROIDS)   Therapeutic left IA Gelsyn-3 knee inj. #3 + left L3-4 LESI #1 (w/o steroids)    Under consideration:   Possible bilateral lumbar facet RFA  Diagnostic bilateral genicular NB    Completed:   Diagnostic right lumbar facet MBB x3 (no steroid) (03/26/2021) (100/100/50/50)  Diagnostic left lumbar facet MBB x1 (w/ steroid) (10/09/2020) (100/100/90/L-100; R-50)  Therapeutic left L5 TFESI x4 (09/21/2018) (100/100/50/70)  Therapeutic left L4-5 LESI x4 (09/21/2018) (100/100/50/70)  Therapeutic left L5-S1 LESI x2 (02/25/2018) (100/100/100/100)  Therapeutic bilateral Hyalgan knee inj. x5 (02/25/2018) (100/100/100 x2 weeks/<50)  Therapeutic left IA steroid knee injection x1 (11/23/2017) (100/100/0/0)  Therapeutic left IA Gelsyn-3 knee inj. x2    Therapeutic  Palliative (PRN) options:  (NO PRN PROCEDURES W/O MD F2F EVALUATION) Palliative L5 TFESI  Palliative L4-5 LESI   Palliative L5-S1 LESI   Palliative Hyalgan knee inj.          Recent Visits Date  Type Provider Dept  03/18/22 Procedure visit Milinda Pointer, MD [facility]  02/26/22 Office Visit Milinda Pointer, MD [facility]  02/11/22 Procedure visit Milinda Pointer, MD [facility]  01/21/22 Office Visit Milinda Pointer, MD [facility]  Showing recent visits within past 90 days and meeting all other requirements Today's Visits Date Type Provider Dept  04/01/22 Office Visit Milinda Pointer, MD [facility]  Showing today's visits and meeting all other requirements Future Appointments Date Type Provider Dept  04/23/22 Appointment Milinda Pointer, MD [facility]  Showing future appointments within next 90 days and meeting all other requirements  I discussed the assessment and treatment plan with the patient. The patient was provided an opportunity to ask questions and all were answered. The patient agreed with the plan and demonstrated an understanding of the  instructions.  Patient advised to call back or seek an in-person evaluation if the symptoms or condition worsens.  Duration of encounter: 13 minutes.  Note by: Gaspar Cola, MD Date: 04/01/2022; Time: 1:18 PM

## 2022-04-21 NOTE — Progress Notes (Unsigned)
PROVIDER NOTE: Information contained herein reflects review and annotations entered in association with encounter. Interpretation of such information and data should be left to medically-trained personnel. Information provided to patient can be located elsewhere in the medical record under "Patient Instructions". Document created using STT-dictation technology, any transcriptional errors that may result from process are unintentional.    Patient: Charlene [name]  Service Category: E/M  Provider: Gaspar Cola, MD  DOB: [birth year]  DOS: 04/23/2022  Referring Provider: Tracie Harrier, MD  MRN: [id number]  Specialty: Interventional Pain Management  PCP: Tracie Harrier, MD  Type: Established Patient  Setting: Ambulatory outpatient    Location: Office  Delivery: Face-to-face     HPI  [name], a 62 y.o. year old female, is here today because of her No primary diagnosis found.. Charlene [name] primary complain today is No chief complaint on file.  Pertinent problems: Charlene [name] has Burning sensation of feet; Neuropathic pain; Neuropathy; Lower extremity numbness and tingling (Left); Polyneuropathy associated with underlying disease; Chronic ankle pain (Left); Chronic foot pain (Left); Chronic lower extremity pain (2ry area of Pain) (Bilateral) (L>R); Chronic knee pain (4th area of Pain) (Bilateral) (L>R); Chronic low back pain (3ry area of Pain) (Bilateral) (L>R) w/ sciatica (Left); Chronic pain syndrome; DM type 2 with diabetic peripheral neuropathy; DDD (degenerative disc disease), lumbar; Lumbar facet arthropathy; Lumbar facet syndrome; Lumbar facet hypertrophy; Lumbar foraminal stenosis (L5-S1) (Right); Osteoarthritis of knee (Bilateral); Chronic musculoskeletal pain; Abnormal EMG (07/06/2017); Osteoarthritis involving multiple joints; 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; Knee pain; Back pain with left-sided sciatica; Meningioma; Neck pain; Chronic pain disorder; Left  leg weakness; Meralgia paraesthetica, left; Pain in both lower extremities; Arthropathy of lumbar facet joint; Chronic ankle and foot pain (1ry area of Pain) (Left); 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 Pain Assessment: Severity of   is reported as a  /10. Location:    / . Onset:  . Quality:  . Timing:  . Modifying factor(s):  Marland Kitchen Vitals:  vitals were not taken for this visit.  BMI: Estimated body mass index is 29.29 kg/m as calculated from the following:   Height as of 03/18/22: 4\' 11"  (1.499 m).   Weight as of 03/18/22: 145 lb (65.8 kg). Last encounter: 04/01/2022. Last procedure: 03/18/2022.  Reason for encounter:  *** . ***  Pharmacotherapy Assessment  Analgesic: Tramadol 50 mg, 1 tab PO QD. ABNORMAL UDS (09/03/2020) (+) for COCAINE. MME: 5 mg/day   Monitoring: [place] PMP: PDMP reviewed during this encounter.       Pharmacotherapy: No side-effects or adverse reactions reported. Compliance: No problems identified. Effectiveness: Clinically acceptable.  No notes on file  No results found for: "CBDTHCR" No results found for: "D8THCCBX" No results found for: "D9THCCBX"  UDS:  Summary  Date Value Ref Range Status  02/26/2022 Note  Final    Comment:    ==================================================================== [employer] [facility] (MW) ==================================================================== Test  Result        Flag       Units  Drug Present and Declared for Prescription Verification   Tramadol                       4758         EXPECTED   ng/mg creat   O-Desmethyltramadol            >6173        EXPECTED   ng/mg creat   N-Desmethyltramadol            1183         EXPECTED   ng/mg creat    Source of tramadol is a prescription medication. O-desmethyltramadol    and N-desmethyltramadol are expected metabolites of tramadol.  ==================================================================== Test                      Result    Flag   Units      Ref Range   Creatinine              81               mg/dL      >=20 ==================================================================== Declared Medications:  The flagging and interpretation on this report are based on the  following declared medications.  Unexpected results may arise from  inaccuracies in the declared medications.   **Note: The testing scope of this panel includes these medications:   Tramadol (Ultram)   **Note: The testing scope of this panel does not include the  following reported medications:   Amlodipine (Norvasc)  Baclofen (Lioresal)  Carvedilol (Coreg)  Cholecalciferol  Dicyclomine (Bentyl)  Duloxetine (Cymbalta)  Hydralazine (Apresoline)  Insulin (Toujeo)  Lisinopril (Zestril)  Magnesium  Metformin (Glucophage)  Nortriptyline (Pamelor)  Pantoprazole (Protonix)  Polyethylene Glycol (MiraLAX)  Pravastatin (Pravachol)  Pregabalin (Lyrica)  Sucralfate (Carafate)  Topical Lidocaine (Lidoderm) ==================================================================== For clinical consultation, please call [phone]. ====================================================================       ROS  Constitutional: Denies any fever or chills Gastrointestinal: No reported hemesis, hematochezia, vomiting, or acute GI distress Musculoskeletal: Denies any acute onset joint swelling, redness, loss of ROM, or  weakness Neurological: No reported episodes of acute onset apraxia, aphasia, dysarthria, agnosia, amnesia, paralysis, loss of coordination, or loss of consciousness  Medication Review  Cholecalciferol, DULoxetine, FreeStyle Libre Sensor System, Magnesium, amLODipine, baclofen, carvedilol, dicyclomine, hydrALAZINE, insulin glargine (2 Unit Dial), insulin lispro, lidocaine, lisinopril, metFORMIN, nortriptyline, pantoprazole, polyethylene glycol, pravastatin, pregabalin, sucralfate, and traMADol  History Review  Allergy: Charlene [name] is allergic to semaglutide. Drug: Charlene [name]  reports no history of drug use. Alcohol:  reports that she does not currently use alcohol. Tobacco:  reports that she has never smoked. She has never used smokeless tobacco. Social: Charlene [name]  reports that she has never smoked. She has never used smokeless tobacco. She reports that she does not currently use alcohol. She reports that she does not use drugs. Medical:  has a past medical history of Arthritis, Degenerative disc disease, lumbar, Diabetes mellitus without complication ([place]), GERD (gastroesophageal reflux disease), Hypertension, Neuropathy, Polyneuropathy, and Polyneuropathy. Surgical: Charlene [name]  has a past surgical history that includes arthroscopic rotator cuff; Colonoscopy; Abdominal hysterectomy; endosopic sinus; and Cataract extraction w/PHACO (Right, 11/25/2017). Family: family history includes Alcohol abuse in her father; Breast cancer in her maternal aunt; Cancer in her father.  Laboratory Chemistry Profile   Renal Lab Results  Component Value Date  BUN 13 12/23/2018   CREATININE 0.59 12/23/2018   BCR 17 10/12/2017   GFRAA >60 12/23/2018   GFRNONAA >60 12/23/2018    Hepatic Lab Results  Component Value Date   AST 17 10/21/2018   ALT 21 10/21/2018   ALBUMIN 3.6 10/21/2018   ALKPHOS 81 10/21/2018   LIPASE 20 10/21/2018    Electrolytes Lab Results  Component Value Date   NA 137 12/23/2018    K 4.1 12/23/2018   CL 100 12/23/2018   CALCIUM 9.8 12/23/2018   MG 2.0 10/12/2017    Bone Lab Results  Component Value Date   25OHVITD1 17 (L) 10/12/2017   25OHVITD2 <1.0 10/12/2017   25OHVITD3 17 10/12/2017    Inflammation (CRP: Acute Phase) (ESR: Chronic Phase) Lab Results  Component Value Date   CRP 4 10/12/2017   ESRSEDRATE 12 10/12/2017         Note: Above Lab results reviewed.  Recent Imaging Review  DG PAIN CLINIC C-ARM 1-60 MIN NO REPORT Fluoro was used, but no Radiologist interpretation will be provided.  Please refer to "NOTES" tab for provider progress note. Note: Reviewed        Physical Exam  General appearance: Well nourished, well developed, and well hydrated. In no apparent acute distress Mental status: Alert, oriented x 3 (person, place, & time)       Respiratory: No evidence of acute respiratory distress Eyes: PERLA Vitals: There were no vitals taken for this visit. BMI: Estimated body mass index is 29.29 kg/m as calculated from the following:   Height as of 03/18/22: 4\' 11"  (1.499 m).   Weight as of 03/18/22: 145 lb (65.8 kg). Ideal: Patient weight not recorded  Assessment   Diagnosis Status  No diagnosis found. Controlled Controlled Controlled   Updated Problems: No problems updated.  Plan of Care  Problem-specific:  No problem-specific Assessment & Plan notes found for this encounter.  Charlene [name] has a current medication list which includes the following long-term medication(s): amlodipine, baclofen, carvedilol, cholecalciferol, dicyclomine, duloxetine, hydralazine, toujeo max solostar, insulin lispro, lisinopril, magnesium, nortriptyline, pantoprazole, pravastatin, pregabalin, sucralfate, and tramadol.  Pharmacotherapy (Medications Ordered): No orders of the defined types were placed in this encounter.  Orders:  No orders of the defined types were placed in this encounter.  Follow-up plan:   No follow-ups on file.       Interventional Therapies  Risk Factors  Considerations:   NOTE: UNCONTROLLED IDDM (NO STEROIDS)  ABNORMAL UDS (09/03/2020) (+) for COCAINE       Planned  Pending:  (NO STEROIDS)  Therapeutic left IA Gelsyn-3 knee inj. #3 + left L3-4 LESI #1 (w/o steroids)    Under consideration:   Possible bilateral lumbar facet RFA  Diagnostic bilateral genicular NB    Completed:   Diagnostic right lumbar facet MBB x3 (no steroid) (03/26/2021) (100/100/50/50)  Diagnostic left lumbar facet MBB x1 (w/ steroid) (10/09/2020) (100/100/90/L-100; R-50)  Therapeutic left L5 TFESI x4 (09/21/2018) (100/100/50/70)  Therapeutic left L4-5 LESI x4 (09/21/2018) (100/100/50/70)  Therapeutic left L5-S1 LESI x2 (02/25/2018) (100/100/100/100)  Therapeutic bilateral Hyalgan knee inj. x5 (02/25/2018) (100/100/100 x2 weeks/<50)  Therapeutic left IA steroid knee injection x1 (11/23/2017) (100/100/0/0)  Therapeutic left IA Gelsyn-3 knee inj. x2    Therapeutic  Palliative (PRN) options:  (NO PRN PROCEDURES W/O MD F2F EVALUATION) Palliative L5 TFESI  Palliative L4-5 LESI   Palliative L5-S1 LESI   Palliative Hyalgan knee inj.           Recent Visits  Date Type Provider Dept  04/01/22 Office Visit Milinda Pointer, MD [facility]  03/18/22 Procedure visit Milinda Pointer, MD [facility]  02/26/22 Office Visit Milinda Pointer, MD [facility]  02/11/22 Procedure visit Milinda Pointer, MD [facility]  01/21/22 Office Visit Milinda Pointer, MD [facility]  Showing recent visits within past 90 days and meeting all other requirements Future Appointments Date Type Provider Dept  04/23/22 Appointment Milinda Pointer, MD [facility]  Showing future appointments within next 90 days and meeting all other requirements  I discussed the assessment and treatment plan with the patient. The patient was provided an opportunity to ask questions and all were  answered. The patient agreed with the plan and demonstrated an understanding of the instructions.  Patient advised to call back or seek an in-person evaluation if the symptoms or condition worsens.  Duration of encounter: *** minutes.  Total time on encounter, as per AMA guidelines included both the face-to-face and non-face-to-face time personally spent by the physician and/or other qualified health care professional(s) on the day of the encounter (includes time in activities that require the physician or other qualified health care professional and does not include time in activities normally performed by clinical staff). Physician's time may include the following activities when performed: Preparing to see the patient (e.g., pre-charting review of records, searching for previously ordered imaging, lab work, and nerve conduction tests) Review of prior analgesic pharmacotherapies. Reviewing PMP Interpreting ordered tests (e.g., lab work, imaging, nerve conduction tests) Performing post-procedure evaluations, including interpretation of diagnostic procedures Obtaining and/or reviewing separately obtained history Performing a medically appropriate examination and/or evaluation Counseling and educating the patient/family/caregiver Ordering medications, tests, or procedures Referring and communicating with other health care professionals (when not separately reported) Documenting clinical information in the electronic or other health record Independently interpreting results (not separately reported) and communicating results to the patient/ family/caregiver Care coordination (not separately reported)  Note by: Gaspar Cola, MD Date: 04/23/2022; Time: 12:43 PM

## 2022-04-23 ENCOUNTER — Encounter: Payer: Self-pay | Admitting: Pain Medicine

## 2022-04-23 ENCOUNTER — Ambulatory Visit: Payer: Medicare Other | Attending: Pain Medicine | Admitting: Pain Medicine

## 2022-04-23 VITALS — BP 156/88 | HR 80 | Temp 98.1°F | Ht 59.0 in | Wt 145.0 lb

## 2022-04-23 DIAGNOSIS — Z79891 Long term (current) use of opiate analgesic: Secondary | ICD-10-CM

## 2022-04-23 DIAGNOSIS — E1142 Type 2 diabetes mellitus with diabetic polyneuropathy: Secondary | ICD-10-CM | POA: Diagnosis present

## 2022-04-23 DIAGNOSIS — M25561 Pain in right knee: Secondary | ICD-10-CM

## 2022-04-23 DIAGNOSIS — M4316 Spondylolisthesis, lumbar region: Secondary | ICD-10-CM | POA: Diagnosis not present

## 2022-04-23 DIAGNOSIS — F1491 Cocaine use, unspecified, in remission: Secondary | ICD-10-CM | POA: Diagnosis present

## 2022-04-23 DIAGNOSIS — F1991 Other psychoactive substance use, unspecified, in remission: Secondary | ICD-10-CM

## 2022-04-23 DIAGNOSIS — G894 Chronic pain syndrome: Secondary | ICD-10-CM

## 2022-04-23 DIAGNOSIS — R892 Abnormal level of other drugs, medicaments and biological substances in specimens from other organs, systems and tissues: Secondary | ICD-10-CM

## 2022-04-23 DIAGNOSIS — M79604 Pain in right leg: Secondary | ICD-10-CM | POA: Diagnosis present

## 2022-04-23 DIAGNOSIS — G8929 Other chronic pain: Secondary | ICD-10-CM | POA: Diagnosis present

## 2022-04-23 DIAGNOSIS — M79605 Pain in left leg: Secondary | ICD-10-CM | POA: Diagnosis present

## 2022-04-23 DIAGNOSIS — M47817 Spondylosis without myelopathy or radiculopathy, lumbosacral region: Secondary | ICD-10-CM

## 2022-04-23 DIAGNOSIS — M47816 Spondylosis without myelopathy or radiculopathy, lumbar region: Secondary | ICD-10-CM

## 2022-04-23 DIAGNOSIS — M545 Low back pain, unspecified: Secondary | ICD-10-CM | POA: Diagnosis not present

## 2022-04-23 DIAGNOSIS — Z79899 Other long term (current) drug therapy: Secondary | ICD-10-CM

## 2022-04-23 DIAGNOSIS — M25562 Pain in left knee: Secondary | ICD-10-CM | POA: Insufficient documentation

## 2022-04-23 DIAGNOSIS — M25572 Pain in left ankle and joints of left foot: Secondary | ICD-10-CM

## 2022-04-23 MED ORDER — TRAMADOL HCL 50 MG PO TABS: 50.0000 mg | ORAL_TABLET | Freq: Every day | ORAL | 5 refills | Status: DC

## 2022-04-23 MED ORDER — NALOXONE HCL 4 MG/0.1ML NA LIQD: 1.0000 | NASAL | 0 refills | Status: AC | PRN

## 2022-04-23 NOTE — Progress Notes (Signed)
Nursing Pain Medication Assessment:  Safety precautions to be maintained throughout the outpatient stay will include: orient to surroundings, keep bed in low position, maintain call bell within reach at all times, provide assistance with transfer out of bed and ambulation.  Medication Inspection Compliance: Pill count conducted under aseptic conditions, in front of the patient. Neither the pills nor the bottle was removed from the patient's sight at any time. Once count was completed pills were immediately returned to the patient in their original bottle.  Medication: Tramadol (Ultram) Pill/Patch Count:  29 of 30 pills remain Pill/Patch Appearance: Markings consistent with prescribed medication Bottle Appearance: Standard pharmacy container. Clearly labeled. Filled Date: 3 / 26 / 2024 Last Medication intake:  Day before yesterdaySafety precautions to be maintained throughout the outpatient stay will include: orient to surroundings, keep bed in low position, maintain call bell within reach at all times, provide assistance with transfer out of bed and ambulation.

## 2022-04-23 NOTE — Patient Instructions (Signed)
______________________________________________________________________  Procedure instructions  Do not eat or drink fluids (other than water) for 6 hours before your procedure  No water for 2 hours before your procedure  Take your blood pressure medicine with a sip of water  Arrive 30 minutes before your appointment  Carefully read the "Preparing for your procedure" detailed instructions  If you have questions call us at [phone]  _____________________________________________________________________    ______________________________________________________________________  Preparing for your procedure  Appointments: If you think you may not be able to keep your appointment, call 24-48 hours in advance to cancel. We need time to make it available to others.  During your procedure appointment there will be: No Prescription Refills. No disability issues to discussed. No medication changes or discussions.  Instructions: Food intake: Avoid eating anything solid for at least 8 hours prior to your procedure. Clear liquid intake: You may take clear liquids such as water up to 2 hours prior to your procedure. (No carbonated drinks. No soda.) Transportation: Unless otherwise stated by your physician, bring a driver. Morning Medicines: Except for blood thinners, take all of your other morning medications with a sip of water. Make sure to take your heart and blood pressure medicines. If your blood pressure's lower number is above 100, the case will be rescheduled. Blood thinners: Make sure to stop your blood thinners as instructed.  If you take a blood thinner, but were not instructed to stop it, call our office [phone] and ask to talk to a nurse. Not stopping a blood thinner prior to certain procedures could lead to serious complications. Diabetics on insulin: Notify the staff so that you can be scheduled 1st case in the morning. If your diabetes requires high dose insulin,  take only  of your normal insulin dose the morning of the procedure and notify the staff that you have done so. Preventing infections: Shower with an antibacterial soap the morning of your procedure.  Build-up your immune system: Take 1000 mg of Vitamin C with every meal (3 times a day) the day prior to your procedure. Antibiotics: Inform the nursing staff if you are taking any antibiotics or if you have any conditions that may require antibiotics prior to procedures. (Example: recent joint implants)   Pregnancy: If you are pregnant make sure to notify the nursing staff. Not doing so may result in injury to the fetus, including death.  Sickness: If you have a cold, fever, or any active infections, call and cancel or reschedule your procedure. Receiving steroids while having an infection may result in complications. Arrival: You must be in the facility at least 30 minutes prior to your scheduled procedure. Tardiness: Your scheduled time is also the cutoff time. If you do not arrive at least 15 minutes prior to your procedure, you will be rescheduled.  Children: Do not bring any children with you. Make arrangements to keep them home. Dress appropriately: There is always a possibility that your clothing may get soiled. Avoid long dresses. Valuables: Do not bring any jewelry or valuables.  Reasons to call and reschedule or cancel your procedure: (Following these recommendations will minimize the risk of a serious complication.) Surgeries: Avoid having procedures within 2 weeks of any surgery. (Avoid for 2 weeks before or after any surgery). Flu Shots: Avoid having procedures within 2 weeks of a flu shots or . (Avoid for 2 weeks before or after immunizations). Barium: Avoid having a procedure within 7-10 days after having had a radiological study involving the use of   radiological contrast. (Myelograms, Barium swallow or enema study). Heart attacks: Avoid any elective procedures or surgeries for the  initial 6 months after a "Myocardial Infarction" (Heart Attack). Blood thinners: It is imperative that you stop these medications before procedures. Let us know if you if you take any blood thinner.  Infection: Avoid procedures during or within two weeks of an infection (including chest colds or gastrointestinal problems). Symptoms associated with infections include: Localized redness, fever, chills, night sweats or profuse sweating, burning sensation when voiding, cough, congestion, stuffiness, runny nose, sore throat, diarrhea, nausea, vomiting, cold or Flu symptoms, recent or current infections. It is specially important if the infection is over the area that we intend to treat. Heart and lung problems: Symptoms that may suggest an active cardiopulmonary problem include: cough, chest pain, breathing difficulties or shortness of breath, dizziness, ankle swelling, uncontrolled high or unusually low blood pressure, and/or palpitations. If you are experiencing any of these symptoms, cancel your procedure and contact your primary care physician for an evaluation.  Remember:  Regular Business hours are:  Monday to Thursday 8:00 AM to 4:00 PM  Provider's Schedule: Jenesys Casseus, MD:  Procedure days: Tuesday and Thursday 7:30 AM to 4:00 PM  Bilal Lateef, MD:  Procedure days: Monday and Wednesday 7:30 AM to 4:00 PM  ______________________________________________________________________    ____________________________________________________________________________________________  General Risks and Possible Complications  Patient Responsibilities: It is important that you read this as it is part of your informed consent. It is our duty to inform you of the risks and possible complications associated with treatments offered to you. It is your responsibility as a patient to read this and to ask questions about anything that is not clear or that you believe was not covered in this  document.  Patient's Rights: You have the right to refuse treatment. You also have the right to change your mind, even after initially having agreed to have the treatment done. However, under this last option, if you wait until the last second to change your mind, you may be charged for the materials used up to that point.  Introduction: Medicine is not an exact science. Everything in Medicine, including the lack of treatment(s), carries the potential for danger, harm, or loss (which is by definition: Risk). In Medicine, a complication is a secondary problem, condition, or disease that can aggravate an already existing one. All treatments carry the risk of possible complications. The fact that a side effects or complications occurs, does not imply that the treatment was conducted incorrectly. It must be clearly understood that these can happen even when everything is done following the highest safety standards.  No treatment: You can choose not to proceed with the proposed treatment alternative. The "PRO(s)" would include: avoiding the risk of complications associated with the therapy. The "CON(s)" would include: not getting any of the treatment benefits. These benefits fall under one of three categories: diagnostic; therapeutic; and/or palliative. Diagnostic benefits include: getting information which can ultimately lead to improvement of the disease or symptom(s). Therapeutic benefits are those associated with the successful treatment of the disease. Finally, palliative benefits are those related to the decrease of the primary symptoms, without necessarily curing the condition (example: decreasing the pain from a flare-up of a chronic condition, such as incurable terminal cancer).  General Risks and Complications: These are associated to most interventional treatments. They can occur alone, or in combination. They fall under one of the following six (6) categories: no benefit or worsening of symptoms;    bleeding; infection; nerve damage; allergic reactions; and/or death. No benefits or worsening of symptoms: In Medicine there are no guarantees, only probabilities. No healthcare provider can ever guarantee that a medical treatment will work, they can only state the probability that it may. Furthermore, there is always the possibility that the condition may worsen, either directly, or indirectly, as a consequence of the treatment. Bleeding: This is more common if the patient is taking a blood thinner, either prescription or over the counter (example: Goody Powders, Fish oil, Aspirin, Garlic, etc.), or if suffering a condition associated with impaired coagulation (example: Hemophilia, cirrhosis of the liver, low platelet counts, etc.). However, even if you do not have one on these, it can still happen. If you have any of these conditions, or take one of these drugs, make sure to notify your treating physician. Infection: This is more common in patients with a compromised immune system, either due to disease (example: diabetes, cancer, human immunodeficiency virus [HIV], etc.), or due to medications or treatments (example: therapies used to treat cancer and rheumatological diseases). However, even if you do not have one on these, it can still happen. If you have any of these conditions, or take one of these drugs, make sure to notify your treating physician. Nerve Damage: This is more common when the treatment is an invasive one, but it can also happen with the use of medications, such as those used in the treatment of cancer. The damage can occur to small secondary nerves, or to large primary ones, such as those in the spinal cord and brain. This damage may be temporary or permanent and it may lead to impairments that can range from temporary numbness to permanent paralysis and/or brain death. Allergic Reactions: Any time a substance or material comes in contact with our body, there is the possibility of an  allergic reaction. These can range from a mild skin rash (contact dermatitis) to a severe systemic reaction (anaphylactic reaction), which can result in death. Death: In general, any medical intervention can result in death, most of the time due to an unforeseen complication. ____________________________________________________________________________________________    ____________________________________________________________________________________________  Opioid Pain Medication Update  To: All patients taking opioid pain medications. (I.e.: hydrocodone, hydromorphone, oxycodone, oxymorphone, morphine, codeine, methadone, tapentadol, tramadol, buprenorphine, fentanyl, etc.)  Re: Updated review of side effects and adverse reactions of opioid analgesics, as well as new information about long term effects of this class of medications.  Direct risks of long-term opioid therapy are not limited to opioid addiction and overdose. Potential medical risks include serious fractures, breathing problems during sleep, hyperalgesia, immunosuppression, chronic constipation, bowel obstruction, myocardial infarction, and tooth decay secondary to xerostomia.  Unpredictable adverse effects that can occur even if you take your medication correctly: Cognitive impairment, respiratory depression, and death. Most people think that if they take their medication "correctly", and "as instructed", that they will be safe. Nothing could be farther from the truth. In reality, a significant amount of recorded deaths associated with the use of opioids has occurred in individuals that had taken the medication for a long time, and were taking their medication correctly. The following are examples of how this can happen: Patient taking his/her medication for a long time, as instructed, without any side effects, is given a certain antibiotic or another unrelated medication, which in turn triggers a "Drug-to-drug interaction"  leading to disorientation, cognitive impairment, impaired reflexes, respiratory depression or an untoward event leading to serious bodily harm or injury, including death.  Patient taking   his/her medication for a long time, as instructed, without any side effects, develops an acute impairment of liver and/or kidney function. This will lead to a rapid inability of the body to breakdown and eliminate their pain medication, which will result in effects similar to an "overdose", but with the same medicine and dose that they had always taken. This again may lead to disorientation, cognitive impairment, impaired reflexes, respiratory depression or an untoward event leading to serious bodily harm or injury, including death.  A similar problem will occur with patients as they grow older and their liver and kidney function begins to decrease as part of the aging process.  Background information: Historically, the original case for using long-term opioid therapy to treat chronic noncancer pain was based on safety assumptions that subsequent experience has called into question. In 1996, the American Pain Society and the American Academy of Pain Medicine issued a consensus statement supporting long-term opioid therapy. This statement acknowledged the dangers of opioid prescribing but concluded that the risk for addiction was low; respiratory depression induced by opioids was short-lived, occurred mainly in opioid-naive patients, and was antagonized by pain; tolerance was not a common problem; and efforts to control diversion should not constrain opioid prescribing. This has now proven to be wrong. Experience regarding the risks for opioid addiction, misuse, and overdose in community practice has failed to support these assumptions.  According to the Centers for Disease Control and Prevention, fatal overdoses involving opioid analgesics have increased sharply over the past decade. Currently, more than 96,700 people die  from drug overdoses every year. Opioids are a factor in 7 out of every 10 overdose deaths. Deaths from drug overdose have surpassed motor vehicle accidents as the leading cause of death for individuals between the ages of 35 and 54.  Clinical data suggest that neuroendocrine dysfunction may be very common in both men and women, potentially causing hypogonadism, erectile dysfunction, infertility, decreased libido, osteoporosis, and depression. Recent studies linked higher opioid dose to increased opioid-related mortality. Controlled observational studies reported that long-term opioid therapy may be associated with increased risk for cardiovascular events. Subsequent meta-analysis concluded that the safety of long-term opioid therapy in elderly patients has not been proven.   Side Effects and adverse reactions: Common side effects: Drowsiness (sedation). Dizziness. Nausea and vomiting. Constipation. Physical dependence -- Dependence often manifests with withdrawal symptoms when opioids are discontinued or decreased. Tolerance -- As you take repeated doses of opioids, you require increased medication to experience the same effect of pain relief. Respiratory depression -- This can occur in healthy people, especially with higher doses. However, people with COPD, asthma or other lung conditions may be even more susceptible to fatal respiratory impairment.  Uncommon side effects: An increased sensitivity to feeling pain and extreme response to pain (hyperalgesia). Chronic use of opioids can lead to this. Delayed gastric emptying (the process by which the contents of your stomach are moved into your small intestine). Muscle rigidity. Immune system and hormonal dysfunction. Quick, involuntary muscle jerks (myoclonus). Arrhythmia. Itchy skin (pruritus). Dry mouth (xerostomia).  Long-term side effects: Chronic constipation. Sleep-disordered breathing (SDB). Increased risk of bone  fractures. Hypothalamic-pituitary-adrenal dysregulation. Increased risk of overdose.  RISKS: Fractures and Falls:  Opioids increase the risk and incidence of falls. This is of particular importance in elderly patients.  Endocrine System:  Long-term administration is associated with endocrine abnormalities (endocrinopathies). (Also known as Opioid-induced Endocrinopathy) Influences on both the hypothalamic-pituitary-adrenal axis?and the hypothalamic-pituitary-gonadal axis have been demonstrated with consequent hypogonadism and adrenal   insufficiency in both sexes. Hypogonadism and decreased levels of dehydroepiandrosterone sulfate have been reported in men and women. Endocrine effects include: Amenorrhoea in women (abnormal absence of menstruation) Reduced libido in both sexes Decreased sexual function Erectile dysfunction in men Hypogonadisms (decreased testicular function with shrinkage of testicles) Infertility Depression and fatigue Loss of muscle mass Anxiety Depression Immune suppression Hyperalgesia Weight gain Anemia Osteoporosis Patients (particularly women of childbearing age) should avoid opioids. There is insufficient evidence to recommend routine monitoring of asymptomatic patients taking opioids in the long-term for hormonal deficiencies.  Immune System: Human studies have demonstrated that opioids have an immunomodulating effect. These effects are mediated via opioid receptors both on immune effector cells and in the central nervous system. Opioids have been demonstrated to have adverse effects on antimicrobial response and anti-tumour surveillance. Buprenorphine has been demonstrated to have no impact on immune function.  Opioid Induced Hyperalgesia: Human studies have demonstrated that prolonged use of opioids can lead to a state of abnormal pain sensitivity, sometimes called opioid induced hyperalgesia (OIH). Opioid induced hyperalgesia is not usually seen in the  absence of tolerance to opioid analgesia. Clinically, hyperalgesia may be diagnosed if the patient on long-term opioid therapy presents with increased pain. This might be qualitatively and anatomically distinct from pain related to disease progression or to breakthrough pain resulting from development of opioid tolerance. Pain associated with hyperalgesia tends to be more diffuse than the pre-existing pain and less defined in quality. Management of opioid induced hyperalgesia requires opioid dose reduction.  Cancer: Chronic opioid therapy has been associated with an increased risk of cancer among noncancer patients with chronic pain. This association was more evident in chronic strong opioid users. Chronic opioid consumption causes significant pathological changes in the small intestine and colon. Epidemiological studies have found that there is a link between opium dependence and initiation of gastrointestinal cancers. Cancer is the second leading cause of death after cardiovascular disease. Chronic use of opioids can cause multiple conditions such as GERD, immunosuppression and renal damage as well as carcinogenic effects, which are associated with the incidence of cancers.   Mortality: Long-term opioid use has been associated with increased mortality among patients with chronic non-cancer pain (CNCP).  Prescription of long-acting opioids for chronic noncancer pain was associated with a significantly increased risk of all-cause mortality, including deaths from causes other than overdose.  Reference: Von Korff M, Kolodny A, Deyo RA, Chou R. Long-term opioid therapy reconsidered. Ann Intern Med. 2011 Sep 6;155(5):325-8. doi: 10.7326/[id number]. PMID: 21893626; PMCID: PMC3280085. Bedson J, Chen Y, Ashworth J, Hayward RA, Dunn KM, Jordan KP. Risk of adverse events in patients prescribed long-term opioids: A cohort study in the [place] Clinical Practice Research D[employer]. Eur J Pain. 2019  May;23(5):908-922. doi: 10.1002/ejp.1357. Epub 2019 Jan 31. PMID: 30620116. Colameco S, Coren JS, Ciervo CA. Continuous opioid treatment for chronic noncancer pain: a time for moderation in prescribing. Postgrad Med. 2009 Jul;121(4):61-6. doi: 10.3810/pgm.2009.07.2032. PMID: 19641271. Chou R, Turner JA, Devine EB, Hansen RN, Sullivan SD, Blazina I, Dana T, Bougatsos C, Deyo RA. The effectiveness and risks of long-term opioid therapy for chronic pain: a systematic review for a National Institutes of Health Pathways to Prevention Workshop. Ann Intern Med. 2015 Feb 17;162(4):276-86. doi: 10.7326/M14-2559. PMID: 25581257. Warner M, Chen LH, Makuc DM. NCHS Data Brief No. 22. [place]: Centers for Disease Control and Prevention; 2009. Sep, Increase in Fatal Poisonings Involving Opioid Analgesics in the [place], 1999-2006. Song IA, Choi HR, Oh TK. Long-term opioid use and mortality in patients with   chronic non-cancer pain: Ten-year follow-up study in [place] from 2010 through 2019. EClinicalMedicine. 2022 Jul 18;51:101558. doi: 10.1016/j.eclinm.2022.101558. PMID: 35875817; PMCID: PMC9304910. Huser, W., Schubert, T., Vogelmann, T. et al. All-cause mortality in patients with long-term opioid therapy compared with non-opioid analgesics for chronic non-cancer pain: a database study. BMC Med 18, 162 (2020). https://doi.org/10.1186/[email or web address] Rashidian H, Zendehdel K, Kamangar F, Malekzadeh R, Haghdoost AA. An Ecological Study of the Association between Opiate Use and Incidence of Cancers. Addict Health. 2016 Fall;8(4):252-260. PMID: 28819556; PMCID: [id number].  Our Goal: Our goal is to control your pain with means other than the use of opioid pain medications.  Our Recommendation: Talk to your physician about coming off of these medications. We can assist you with the tapering down and stopping these medicines. Based on the new information, even if you cannot completely stop the medication, a  decrease in the dose may be associated with a lesser risk. Ask for other means of controlling the pain. Decrease or eliminate those factors that significantly contribute to your pain such as smoking, obesity, and a diet heavily tilted towards "inflammatory" nutrients.  Last Updated: 03/19/2022   ____________________________________________________________________________________________     ____________________________________________________________________________________________  Patient Information update  To: All of our patients.  Re: Name change.  It has been made official that our current name, "[facility]"   will soon be changed to "[facility]".   The purpose of this change is to eliminate any confusion created by the concept of our practice being a "Medication Management Pain Clinic". In the past this has led to the misconception that we treat pain primarily by the use of prescription medications.  Nothing can be farther from the truth.   Understanding PAIN MANAGEMENT: To further understand what our practice does, you first have to understand that "Pain Management" is a subspecialty that requires additional training once a physician has completed their specialty training, which can be in either Anesthesia, Neurology, Psychiatry, or Physical Medicine and Rehabilitation (PMR). Each one of these contributes to the final approach taken by each physician to the management of their patient's pain. To be a "Pain Management Specialist" you must have first completed one of the specialty trainings below.  Anesthesiologists - trained in clinical pharmacology and interventional techniques such as nerve blockade and regional as well as central neuroanatomy. They are trained to block pain before, during, and after surgical interventions.  Neurologists - trained in the diagnosis and  pharmacological treatment of complex neurological conditions, such as Multiple Sclerosis, Parkinson's, spinal cord injuries, and other systemic conditions that may be associated with symptoms that may include but are not limited to pain. They tend to rely primarily on the treatment of chronic pain using prescription medications.  Psychiatrist - trained in conditions affecting the psychosocial wellbeing of patients including but not limited to depression, anxiety, schizophrenia, personality disorders, addiction, and other substance use disorders that may be associated with chronic pain. They tend to rely primarily on the treatment of chronic pain using prescription medications.   Physical Medicine and Rehabilitation (PMR) physicians, also known as physiatrists - trained to treat a wide variety of medical conditions affecting the brain, spinal cord, nerves, bones, joints, ligaments, muscles, and tendons. Their training is primarily aimed at treating patients that have suffered injuries that have caused severe physical impairment. Their training is primarily aimed at the physical therapy and rehabilitation of those patients. They may also work alongside orthopedic surgeons or neurosurgeons   using their expertise in assisting surgical patients to recover after their surgeries.  INTERVENTIONAL PAIN MANAGEMENT is sub-subspecialty of Pain Management.  Our physicians are Board-certified in Anesthesia, Pain Management, and Interventional Pain Management.  This meaning that not only have they been trained and Board-certified in their specialty of Anesthesia, and subspecialty of Pain Management, but they have also received further training in the sub-subspecialty of Interventional Pain Management, in order to become Board-certified as INTERVENTIONAL PAIN MANAGEMENT SPECIALIST.    Mission: Our goal is to use our skills in  INTERVENTIONAL PAIN MANAGEMENT as alternatives to the chronic use of prescription opioid  medications for the treatment of pain. To make this more clear, we have changed our name to reflect what we do and offer. We will continue to offer medication management assessment and recommendations, but we will not be taking over any patient's medication management.  ____________________________________________________________________________________________     ____________________________________________________________________________________________  National Pain Medication Shortage  [place] experiencing worsening drug shortages. These have had a negative widespread effect on patient care and treatment. Not expected to improve any time soon. Predicted to last past 2029.   Drug shortage list (generic names) Oxycodone IR Oxycodone/APAP Oxymorphone IR Hydromorphone Hydrocodone/APAP Morphine  Where is the problem?  Manufacturing and supply level.  Will this shortage affect you?  Only if you take any of the above pain medications.  How? You may be unable to fill your prescription.  Your pharmacist may offer a "partial fill" of your prescription. (Warning: Do not accept partial fills.) Prescriptions partially filled cannot be transferred to another pharmacy. Read our Medication Rules and Regulation. Depending on how much medicine you are dependent on, you may experience withdrawals when unable to get the medication.  Recommendations: Consider ending your dependence on opioid pain medications. Ask your pain specialist to assist you with the process. Consider switching to a medication currently not in shortage, such as Buprenorphine. Talk to your pain specialist about this option. Consider decreasing your pain medication requirements by managing tolerance thru "Drug Holidays". This may help minimize withdrawals, should you run out of medicine. Control your pain thru the use of non-pharmacological interventional therapies.   Your prescriber: Prescribers cannot be blamed for  shortages. Medication manufacturing and supply issues cannot be fixed by the prescriber.   NOTE: The prescriber is not responsible for supplying the medication, or solving supply issues. Work with your pharmacist to solve it. The patient is responsible for the decision to take or continue taking the medication and for identifying and securing a legal supply source. By law, supplying the medication is the job and responsibility of the pharmacy. The prescriber is responsible for the evaluation, monitoring, and prescribing of these medications.   Prescribers will NOT: Re-issue prescriptions that have been partially filled. Re-issue prescriptions already sent to a pharmacy.  Re-send prescriptions to a different pharmacy because yours did not have your medication. Ask pharmacist to order more medicine or transfer the prescription to another pharmacy. (Read below.)  New 2023 regulation: "September 20, 2021 Revised Regulation Allows DEA-Registered Pharmacies to Transfer Electronic Prescriptions at a Patient's Request DEA Headquarters Division - Public Information Office Patients now have the ability to request their electronic prescription be transferred to another pharmacy without having to go back to their practitioner to initiate the request. This revised regulation went into effect on Monday, September 16, 2021.     At a patient's request, a DEA-registered retail pharmacy can now transfer an electronic prescription for a controlled substance (schedules   II-V) to another DEA-registered retail pharmacy. Prior to this change, patients would have to go through their practitioner to cancel their prescription and have it re-issued to a different pharmacy. The process was taxing and time consuming for both patients and practitioners.    The Drug Enforcement Administration (DEA) published its intent to revise the process for transferring electronic prescriptions on December 09, 2019.  The final rule was published  in the federal register on August 15, 2021 and went into effect 30 days later.  Under the final rule, a prescription can only be transferred once between pharmacies, and only if allowed under existing state or other applicable law. The prescription must remain in its electronic form; may not be altered in any way; and the transfer must be communicated directly between two licensed pharmacists. It's important to note, any authorized refills transfer with the original prescription, which means the entire prescription will be filled at the same pharmacy".  Reference: https://www.dea.gov/stories/2023/2023-09/2021-09-01/revised-regulation-allows-dea-registered-pharmacies-transfer (DEA website announcement)  https://www.govinfo.gov/content/pkg/FR-2021-08-15/pdf/2023-15847.pdf (Federal Register  Department of Justice)   Federal Register / Vol. 88, No. 143 / Thursday, August 15, 2021 / Rules and Regulations DEPARTMENT OF JUSTICE  Drug Enforcement Administration  21 CFR Part 1306  [Docket No. [id number]]  RIN 1117-AB64 Transfer of Electronic Prescriptions for Schedules II-V Controlled Substances Between Pharmacies for Initial Filling  ____________________________________________________________________________________________     ____________________________________________________________________________________________  Transfer of Pain Medication between Pharmacies  Re: 2023 DEA Clarification on existing regulation  Published on DEA Website: September 20, 2021  Title: Revised Regulation Allows DEA-Registered Pharmacies to Transfer Electronic Prescriptions at a Patient's Request DEA Headquarters Division - Public Information Office  "Patients now have the ability to request their electronic prescription be transferred to another pharmacy without having to go back to their practitioner to initiate the request. This revised regulation went into effect on Monday, September 16, 2021.     At a patient's  request, a DEA-registered retail pharmacy can now transfer an electronic prescription for a controlled substance (schedules II-V) to another DEA-registered retail pharmacy. Prior to this change, patients would have to go through their practitioner to cancel their prescription and have it re-issued to a different pharmacy. The process was taxing and time consuming for both patients and practitioners.    The Drug Enforcement Administration (DEA) published its intent to revise the process for transferring electronic prescriptions on December 09, 2019.  The final rule was published in the federal register on August 15, 2021 and went into effect 30 days later.  Under the final rule, a prescription can only be transferred once between pharmacies, and only if allowed under existing state or other applicable law. The prescription must remain in its electronic form; may not be altered in any way; and the transfer must be communicated directly between two licensed pharmacists. It's important to note, any authorized refills transfer with the original prescription, which means the entire prescription will be filled at the same pharmacy."    REFERENCES: 1. DEA website announcement https://www.dea.gov/stories/2023/2023-09/2021-09-01/revised-regulation-allows-dea-registered-pharmacies-transfer  2. Department of Justice website  https://www.govinfo.gov/content/pkg/FR-2021-08-15/pdf/2023-15847.pdf  3. DEPARTMENT OF JUSTICE Drug Enforcement Administration 21 CFR Part 1306 [Docket No. [id number]] RIN 1117-AB64 "Transfer of Electronic Prescriptions for Schedules II-V Controlled Substances Between Pharmacies for Initial Filling"  ____________________________________________________________________________________________     _______________________________________________________________________  Medication Rules  Purpose: To inform patients, and their family members, of our medication rules and  regulations.  Applies to: All patients receiving prescriptions from our practice (written or electronic).  Pharmacy of record: This is the pharmacy where your   electronic prescriptions will be sent. Make sure we have the correct one.  Electronic prescriptions: In compliance with the [place] Strengthen Opioid Misuse Prevention (STOP) Act of 2017 (Session Law 2017-74/H243), effective January 20, 2018, all controlled substances must be electronically prescribed. Written prescriptions, faxing, or calling prescriptions to a pharmacy will no longer be done.  Prescription refills: These will be provided only during in-person appointments. No medications will be renewed without a "face-to-face" evaluation with your provider. Applies to all prescriptions.  NOTE: The following applies primarily to controlled substances (Opioid* Pain Medications).   Type of encounter (visit): For patients receiving controlled substances, face-to-face visits are required. (Not an option and not up to the patient.)  Patient's responsibilities: Pain Pills: Bring all pain pills to every appointment (except for procedure appointments). Pill Bottles: Bring pills in original pharmacy bottle. Bring bottle, even if empty. Always bring the bottle of the most recent fill.  Medication refills: You are responsible for knowing and keeping track of what medications you are taking and when is it that you will need a refill. The day before your appointment: write a list of all prescriptions that need to be refilled. The day of the appointment: give the list to the admitting nurse. Prescriptions will be written only during appointments. No prescriptions will be written on procedure days. If you forget a medication: it will not be "Called in", "Faxed", or "electronically sent". You will need to get another appointment to get these prescribed. No early refills. Do not call asking to have your prescription filled early. Partial  or short  prescriptions: Occasionally your pharmacy may not have enough pills to fill your prescription.  NEVER ACCEPT a partial fill or a prescription that is short of the total amount of pills that you were prescribed.  With controlled substances the law allows 72 hours for the pharmacy to complete the prescription.  If the prescription is not completed within 72 hours, the pharmacist will require a new prescription to be written. This means that you will be short on your medicine and we WILL NOT send another prescription to complete your original prescription.  Instead, request the pharmacy to send a carrier to a nearby branch to get enough medication to provide you with your full prescription. Prescription Accuracy: You are responsible for carefully inspecting your prescriptions before leaving our office. Have the discharge nurse carefully go over each prescription with you, before taking them home. Make sure that your name is accurately spelled, that your address is correct. Check the name and dose of your medication to make sure it is accurate. Check the number of pills, and the written instructions to make sure they are clear and accurate. Make sure that you are given enough medication to last until your next medication refill appointment. Taking Medication: Take medication as prescribed. When it comes to controlled substances, taking less pills or less frequently than prescribed is permitted and encouraged. Never take more pills than instructed. Never take the medication more frequently than prescribed.  Inform other Doctors: Always inform, all of your healthcare providers, of all the medications you take. Pain Medication from other Providers: You are not allowed to accept any additional pain medication from any other Doctor or Healthcare provider. There are two exceptions to this rule. (see below) In the event that you require additional pain medication, you are responsible for notifying us, as stated  below. Cough Medicine: Often these contain an opioid, such as codeine or hydrocodone. Never accept or take cough   medicine containing these opioids if you are already taking an opioid* medication. The combination may cause respiratory failure and death. Medication Agreement: You are responsible for carefully reading and following our Medication Agreement. This must be signed before receiving any prescriptions from our practice. Safely store a copy of your signed Agreement. Violations to the Agreement will result in no further prescriptions. (Additional copies of our Medication Agreement are available upon request.) Laws, Rules, & Regulations: All patients are expected to follow all Federal and State Laws, Statutes, Rules, & Regulations. Ignorance of the Laws does not constitute a valid excuse.  Illegal drugs and Controlled Substances: The use of illegal substances (including, but not limited to marijuana and its derivatives) and/or the illegal use of any controlled substances is strictly prohibited. Violation of this rule may result in the immediate and permanent discontinuation of any and all prescriptions being written by our practice. The use of any illegal substances is prohibited. Adopted CDC guidelines & recommendations: Target dosing levels will be at or below 60 MME/day. Use of benzodiazepines** is not recommended.  Exceptions: There are only two exceptions to the rule of not receiving pain medications from other Healthcare Providers. Exception #1 (Emergencies): In the event of an emergency (i.e.: accident requiring emergency care), you are allowed to receive additional pain medication. However, you are responsible for: As soon as you are able, call our office [phone], at any time of the day or night, and leave a message stating your name, the date and nature of the emergency, and the name and dose of the medication prescribed. In the event that your call is answered by a member of our staff,  make sure to document and save the date, time, and the name of the person that took your information.  Exception #2 (Planned Surgery): In the event that you are scheduled by another doctor or dentist to have any type of surgery or procedure, you are allowed (for a period no longer than 30 days), to receive additional pain medication, for the acute post-op pain. However, in this case, you are responsible for picking up a copy of our "Post-op Pain Management for Surgeons" handout, and giving it to your surgeon or dentist. This document is available at our office, and does not require an appointment to obtain it. Simply go to our office during business hours (Monday-Thursday from 8:00 AM to 4:00 PM) (Friday 8:00 AM to 12:00 Noon) or if you have a scheduled appointment with us, prior to your surgery, and ask for it by name. In addition, you are responsible for: calling our office [phone], at any time of the day or night, and leaving a message stating your name, name of your surgeon, type of surgery, and date of procedure or surgery. Failure to comply with your responsibilities may result in termination of therapy involving the controlled substances. Medication Agreement Violation. Following the above rules, including your responsibilities will help you in avoiding a Medication Agreement Violation ("Breaking your Pain Medication Contract").  Consequences:  Not following the above rules may result in permanent discontinuation of medication prescription therapy.  *Opioid medications include: morphine, codeine, oxycodone, oxymorphone, hydrocodone, hydromorphone, meperidine, tramadol, tapentadol, buprenorphine, fentanyl, methadone. **Benzodiazepine medications include: diazepam (Valium), alprazolam (Xanax), clonazepam (Klonopine), lorazepam (Ativan), clorazepate (Tranxene), chlordiazepoxide (Librium), estazolam (Prosom), oxazepam (Serax), temazepam (Restoril), triazolam (Halcion) (Last updated:  11/12/2021) ______________________________________________________________________    ______________________________________________________________________  Medication Recommendations and Reminders  Applies to: All patients receiving prescriptions (written and/or electronic).  Medication Rules & Regulations: You   are responsible for reading, knowing, and following our "Medication Rules" document. These exist for your safety and that of others. They are not flexible and neither are we. Dismissing or ignoring them is an act of "non-compliance" that may result in complete and irreversible termination of such medication therapy. For safety reasons, "non-compliance" will not be tolerated. As with the [place] fundamental legal principle of "ignorance of the law is no defense", we will accept no excuses for not having read and knowing the content of documents provided to you by our practice.  Pharmacy of record:  Definition: This is the pharmacy where your electronic prescriptions will be sent.  We do not endorse any particular pharmacy. It is up to you and your insurance to decide what pharmacy to use.  We do not restrict you in your choice of pharmacy. However, once we write for your prescriptions, we will NOT be re-sending more prescriptions to fix restricted supply problems created by your pharmacy, or your insurance.  The pharmacy listed in the electronic medical record should be the one where you want electronic prescriptions to be sent. If you choose to change pharmacy, simply notify our nursing staff. Changes will be made only during your regular appointments and not over the phone.  Recommendations: Keep all of your pain medications in a safe place, under lock and key, even if you live alone. We will NOT replace lost, stolen, or damaged medication. We do not accept "Police Reports" as proof of medications having been stolen. After you fill your prescription, take 1 week's worth of pills and put  them away in a safe place. You should keep a separate, properly labeled bottle for this purpose. The remainder should be kept in the original bottle. Use this as your primary supply, until it runs out. Once it's gone, then you know that you have 1 week's worth of medicine, and it is time to come in for a prescription refill. If you do this correctly, it is unlikely that you will ever run out of medicine. To make sure that the above recommendation works, it is very important that you make sure your medication refill appointments are scheduled at least 1 week before you run out of medicine. To do this in an effective manner, make sure that you do not leave the office without scheduling your next medication management appointment. Always ask the nursing staff to show you in your prescription , when your medication will be running out. Then arrange for the receptionist to get you a return appointment, at least 7 days before you run out of medicine. Do not wait until you have 1 or 2 pills left, to come in. This is very poor planning and does not take into consideration that we may need to cancel appointments due to bad weather, sickness, or emergencies affecting our staff. DO NOT ACCEPT A "Partial Fill": If for any reason your pharmacy does not have enough pills/tablets to completely fill or refill your prescription, do not allow for a "partial fill". The law allows the pharmacy to complete that prescription within 72 hours, without requiring a new prescription. If they do not fill the rest of your prescription within those 72 hours, you will need a separate prescription to fill the remaining amount, which we will NOT provide. If the reason for the partial fill is your insurance, you will need to talk to the pharmacist about payment alternatives for the remaining tablets, but again, DO NOT ACCEPT A PARTIAL FILL, unless you can   trust your pharmacist to obtain the remainder of the pills within 72 hours.  Prescription  refills and/or changes in medication(s):  Prescription refills, and/or changes in dose or medication, will be conducted only during scheduled medication management appointments. (Applies to both, written and electronic prescriptions.) No refills on procedure days. No medication will be changed or started on procedure days. No changes, adjustments, and/or refills will be conducted on a procedure day. Doing so will interfere with the diagnostic portion of the procedure. No phone refills. No medications will be "called into the pharmacy". No Fax refills. No weekend refills. No Holliday refills. No after hours refills.  Remember:  Business hours are:  Monday to Thursday 8:00 AM to 4:00 PM Provider's Schedule: Larraine Argo, MD - Appointments are:  Medication management: Monday and Wednesday 8:00 AM to 4:00 PM Procedure day: Tuesday and Thursday 7:30 AM to 4:00 PM Bilal Lateef, MD - Appointments are:  Medication management: Tuesday and Thursday 8:00 AM to 4:00 PM Procedure day: Monday and Wednesday 7:30 AM to 4:00 PM (Last update: 11/12/2021) ______________________________________________________________________    ____________________________________________________________________________________________  Drug Holidays  What is a "Drug Holiday"? Drug Holiday: is the name given to the process of slowly tapering down and temporarily stopping the pain medication for the purpose of decreasing or eliminating tolerance to the drug.  Benefits Improved effectiveness Decreased required effective dose Improved pain control End dependence on high dose therapy Decrease cost of therapy Uncovering "opioid-induced hyperalgesia". (OIH)  What is "opioid hyperalgesia"? It is a paradoxical increase in pain caused by exposure to opioids. Stopping the opioid pain medication, contrary to the expected, it actually decreases or completely eliminates the pain. Ref.: "A comprehensive review of  opioid-induced hyperalgesia". Marion Lee, et.al. Pain Physician. 2011 Mar-Apr;14(2):145-61.  What is tolerance? Tolerance: the progressive loss of effectiveness of a pain medicine due to repetitive use. A common problem of opioid pain medications.  How long should a "Drug Holiday" last? Effectiveness depends on the patient staying off all opioid pain medicines for a minimum of 14 consecutive days. (2 weeks)  How about just taking less of the medicine? Does not work. Will not accomplish goal of eliminating the excess receptors.  How about switching to a different pain medicine? (AKA. "Opioid rotation") Does not work. Creates the illusion of effectiveness by taking advantage of inaccurate equivalent dose calculations between different opioids. -This "technique" was promoted by studies funded by pharmaceutical companies, such as [employer], creators of "OxyContin".  Can I stop the medicine "cold turkey"? We do not recommend it. You should always coordinate with your prescribing physician to make the transition as smoothly as possible. Avoid stopping the medicine abruptly without consulting. We recommend a "slow taper".  What is a slow taper? Taper: refers to the gradual decrease in dose.   How do I stop/taper the dose? Slowly. Decrease the daily amount of pills that you take by one (1) pill every seven (7) days. This is called a "slow downward taper". Example: if you normally take four (4) pills per day, drop it to three (3) pills per day for seven (7) days, then to two (2) pills per day for seven (7) days, then to one (1) per day for seven (7) days, and then stop the medicine. The 14 day "Drug Holiday" starts on the first day without medicine.   Will I experience withdrawals? Unlikely with a slow taper.  What triggers withdrawals? Withdrawals are triggered by the sudden/abrupt stop of high dose opioids. Withdrawals can be avoided   by slowly decreasing the dose over a prolonged period of  time.  What are withdrawals? Symptoms associated with sudden/abrupt reduction/stopping of high-dose, long-term use of pain medication. Withdrawal are seldom seen on low dose therapy, or patients rarely taking opioid medication.  Early Withdrawal Symptoms may include: Agitation Anxiety Muscle aches Increased tearing Insomnia Runny nose Sweating Yawning  Late symptoms may include: Abdominal cramping Diarrhea Dilated pupils Goose bumps Nausea Vomiting  When could I see withdrawals? Onset: 8-24 hours after last use for most opioids. 12-48 hours for long-acting opioids (i.e.: methadone)  How long could they last? Duration: 4-10 days for most opioids. 14-21 days for long-acting opioids (i.e.: methadone)  What will happen after I complete my "Drug Holiday"? The need and indications for the opioid analgesic will be reviewed before restarting the medication. Dose requirements will likely decrease and the dose will need to be adjusted accordingly.   (Last update: 04/09/2022) ____________________________________________________________________________________________    ____________________________________________________________________________________________  WARNING: CBD (cannabidiol) & Delta (Delta-8 tetrahydrocannabinol) products.   Applicable to:  All individuals currently taking or considering taking CBD (cannabidiol) and, more important, all patients taking opioid analgesic controlled substances (pain medication). (Example: oxycodone; oxymorphone; hydrocodone; hydromorphone; morphine; methadone; tramadol; tapentadol; fentanyl; buprenorphine; butorphanol; dextromethorphan; meperidine; codeine; etc.)  Introduction:  Recently there has been a drive towards the use of "natural" products for the treatment of different conditions, including pain anxiety and sleep disorders. Marijuana and hemp are two varieties of the cannabis genus plants. Marijuana and its derivatives are  illegal, while hemp and its derivatives are not. Cannabidiol (CBD) and tetrahydrocannabinol (THC), are two natural compounds found in plants of the Cannabis genus. They can both be extracted from hemp or marijuana. Both compounds interact with your body's endocannabinoid system in very different ways. CBD is associated with pain relief (analgesia) while THC is associated with the psychoactive effects ("the high") obtained from the use of marijuana products. There are two main types of THC: Delta-9, which comes from the marijuana plant and it is illegal, and Delta-8, which comes from the hemp plant, and it is legal. (Both, Delta-9-THC and Delta-8-THC are psychoactive and give you "the high".)   Legality:  Marijuana and its derivatives: illegal Hemp and its derivatives: Legal (State dependent) UPDATE: (03/08/2021) The Drug Enforcement Agency (DEA) issued a letter stating that "delta" cannabinoids, including Delta-8-THCO and Delta-9-THCO, synthetically derived from hemp do not qualify as hemp and will be viewed as Schedule I drugs. (Schedule I drugs, substances, or chemicals are defined as drugs with no currently accepted medical use and a high potential for abuse. Some examples of Schedule I drugs are: heroin, lysergic acid diethylamide (LSD), marijuana (cannabis), 3,4-methylenedioxymethamphetamine (ecstasy), methaqualone, and peyote.) (https://www.dea.gov)  Legal status of CBD in [place]:  "Conditionally Legal"  Reference: "FDA Regulation of Cannabis and Cannabis-Derived Products, Including Cannabidiol (CBD)" - https://www.fda.gov/news-events/public-health-focus/fda-regulation-cannabis-and-cannabis-derived-products-including-cannabidiol-cbd  Warning:  CBD is not FDA approved and has not undergo the same manufacturing controls as prescription drugs.  This means that the purity and safety of available CBD may be questionable. Most of the time, despite manufacturer's claims, it is contaminated with THC  (delta-9-tetrahydrocannabinol - the chemical in marijuana responsible for the "HIGH").  When this is the case, the THC contaminant will trigger a positive urine drug screen (UDS) test for Marijuana (carboxy-THC).   The FDA recently put out a warning about 5 things that everyone should be aware of regarding Delta-8 THC: Delta-8 THC products have not been evaluated or approved by the FDA for safe use and may   be marketed in ways that put the public health at risk. The FDA has received adverse event reports involving delta-8 THC-containing products. Delta-8 THC has psychoactive and intoxicating effects. Delta-8 THC manufacturing often involve use of potentially harmful chemicals to create the concentrations of delta-8 THC claimed in the marketplace. The final delta-8 THC product may have potentially harmful by-products (contaminants) due to the chemicals used in the process. Manufacturing of delta-8 THC products may occur in uncontrolled or unsanitary settings, which may lead to the presence of unsafe contaminants or other potentially harmful substances. Delta-8 THC products should be kept out of the reach of children and pets.  NOTE: Because a positive UDS for any illicit substance is a violation of our medication agreement, your opioid analgesics (pain medicine) may be permanently discontinued.  MORE ABOUT CBD  General Information: CBD was discovered in 1940 and it is a derivative of the cannabis sativa genus plants (Marijuana and Hemp). It is one of the 113 identified substances found in Marijuana. It accounts for up to 40% of the plant's extract. As of 2018, preliminary clinical studies on CBD included research for the treatment of anxiety, movement disorders, and pain. CBD is available and consumed in multiple forms, including inhalation of smoke or vapor, as an aerosol spray, and by mouth. It may be supplied as an oil containing CBD, capsules, dried cannabis, or as a liquid solution. CBD is thought  not to be as psychoactive as THC (delta-9-tetrahydrocannabinol - the chemical in marijuana responsible for the "HIGH"). Studies suggest that CBD may interact with different biological target receptors in the body, including cannabinoid and other neurotransmitter receptors. As of 2018 the mechanism of action for its biological effects has not been determined.  Side-effects  Adverse reactions: Dry mouth, diarrhea, decreased appetite, fatigue, drowsiness, malaise, weakness, sleep disturbances, and others.  Drug interactions:  CBD may interact with medications such as blood-thinners. CBD causes drowsiness on its own and it will increase drowsiness caused by other medications, including antihistamines (such as Benadryl), benzodiazepines (Xanax, Ativan, Valium), antipsychotics, antidepressants, opioids, alcohol and supplements such as kava, melatonin and St. John's Wort.  Other drug interactions: Brivaracetam (Briviact); Caffeine; Carbamazepine (Tegretol); Citalopram (Celexa); Clobazam (Onfi); Eslicarbazepine (Aptiom); Everolimus (Zostress); Lithium; Methadone (Dolophine); Rufinamide (Banzel); Sedative medications (CNS depressants); Sirolimus (Rapamune); Stiripentol (Diacomit); Tacrolimus (Prograf); Tamoxifen ; Soltamox); Topiramate (Topamax); Valproate; Warfarin (Coumadin); Zonisamide. (Last update: 12/30/2021) ____________________________________________________________________________________________   ____________________________________________________________________________________________  Naloxone Nasal Spray  Why am I receiving this medication? [place] STOP ACT requires that all patients taking high dose opioids or at risk of opioids respiratory depression, be prescribed an opioid reversal agent, such as Naloxone (AKA: Narcan).  What is this medication? NALOXONE (nal OX one) treats opioid overdose, which causes slow or shallow breathing, severe drowsiness, or trouble staying awake. Call  emergency services after using this medication. You may need additional treatment. Naloxone works by reversing the effects of opioids. It belongs to a group of medications called opioid blockers.  COMMON BRAND NAME(S): Kloxxado, Narcan  What should I tell my care team before I take this medication? They need to know if you have any of these conditions: Heart disease Substance use disorder An unusual or allergic reaction to naloxone, other medications, foods, dyes, or preservatives Pregnant or trying to get pregnant Breast-feeding  When to use this medication? This medication is to be used for the treatment of respiratory depression (less than 8 breaths per minute) secondary to opioid overdose.   How to use this medication? This medication is   for use in the nose. Lay the person on their back. Support their neck with your hand and allow the head to tilt back before giving the medication. The nasal spray should be given into 1 nostril. After giving the medication, move the person onto their side. Do not remove or test the nasal spray until ready to use. Get emergency medical help right away after giving the first dose of this medication, even if the person wakes up. You should be familiar with how to recognize the signs and symptoms of a narcotic overdose. If more doses are needed, give the additional dose in the other nostril. Talk to your care team about the use of this medication in children. While this medication may be prescribed for children as young as newborns for selected conditions, precautions do apply.  Naloxone Overdosage: If you think you have taken too much of this medicine contact a poison control center or emergency room at once.  NOTE: This medicine is only for you. Do not share this medicine with others.  What if I miss a dose? This does not apply.  What may interact with this medication? This is only used during an emergency. No interactions are expected during emergency  use. This list may not describe all possible interactions. Give your health care provider a list of all the medicines, herbs, non-prescription drugs, or dietary supplements you use. Also tell them if you smoke, drink alcohol, or use illegal drugs. Some items may interact with your medicine.  What should I watch for while using this medication? Keep this medication ready for use in the case of an opioid overdose. Make sure that you have the phone number of your care team and local hospital ready. You may need to have additional doses of this medication. Each nasal spray contains a single dose. Some emergencies may require additional doses. After use, bring the treated person to the nearest hospital or call 911. Make sure the treating care team knows that the person has received a dose of this medication. You will receive additional instructions on what to do during and after use of this medication before an emergency occurs.  What side effects may I notice from receiving this medication? Side effects that you should report to your care team as soon as possible: Allergic reactions--skin rash, itching, hives, swelling of the face, lips, tongue, or throat Side effects that usually do not require medical attention (report these to your care team if they continue or are bothersome): Constipation Dryness or irritation inside the nose Headache Increase in blood pressure Muscle spasms Stuffy nose Toothache This list may not describe all possible side effects. Call your doctor for medical advice about side effects. You may report side effects to FDA at 1-800-FDA-1088.  Where should I keep my medication? Because this is an emergency medication, you should keep it with you at all times.  Keep out of the reach of children and pets. Store between 20 and 25 degrees C (68 and 77 degrees F). Do not freeze. Throw away any unused medication after the expiration date. Keep in original box until ready to  use.  NOTE: This sheet is a summary. It may not cover all possible information. If you have questions about this medicine, talk to your doctor, pharmacist, or health care provider.   2023 [employer]/Gold Standard (2020-09-14 00:00:00)  ____________________________________________________________________________________________   

## 2022-05-13 ENCOUNTER — Ambulatory Visit
Admission: RE | Admit: 2022-05-13 | Discharge: 2022-05-13 | Disposition: A | Discharge status: Home / Self Care | Payer: Medicare Other | Source: Ambulatory Visit | Attending: Pain Medicine | Admitting: Pain Medicine

## 2022-05-13 ENCOUNTER — Encounter: Payer: Self-pay | Admitting: Pain Medicine

## 2022-05-13 ENCOUNTER — Ambulatory Visit: Payer: Medicare Other | Attending: Pain Medicine | Admitting: Pain Medicine

## 2022-05-13 VITALS — BP 160/88 | HR 83 | Temp 97.5°F | Resp 15 | Ht 59.0 in | Wt 145.0 lb

## 2022-05-13 DIAGNOSIS — Z5189 Encounter for other specified aftercare: Secondary | ICD-10-CM | POA: Insufficient documentation

## 2022-05-13 DIAGNOSIS — E1165 Type 2 diabetes mellitus with hyperglycemia: Secondary | ICD-10-CM | POA: Insufficient documentation

## 2022-05-13 DIAGNOSIS — M545 Low back pain, unspecified: Secondary | ICD-10-CM | POA: Diagnosis present

## 2022-05-13 DIAGNOSIS — G8929 Other chronic pain: Secondary | ICD-10-CM | POA: Diagnosis present

## 2022-05-13 DIAGNOSIS — M47816 Spondylosis without myelopathy or radiculopathy, lumbar region: Secondary | ICD-10-CM | POA: Insufficient documentation

## 2022-05-13 DIAGNOSIS — M4310 Spondylolisthesis, site unspecified: Secondary | ICD-10-CM | POA: Diagnosis present

## 2022-05-13 DIAGNOSIS — R937 Abnormal findings on diagnostic imaging of other parts of musculoskeletal system: Secondary | ICD-10-CM

## 2022-05-13 DIAGNOSIS — M47817 Spondylosis without myelopathy or radiculopathy, lumbosacral region: Secondary | ICD-10-CM | POA: Diagnosis present

## 2022-05-13 DIAGNOSIS — M5136 Other intervertebral disc degeneration, lumbar region: Secondary | ICD-10-CM | POA: Insufficient documentation

## 2022-05-13 DIAGNOSIS — M4316 Spondylolisthesis, lumbar region: Secondary | ICD-10-CM

## 2022-05-13 MED ORDER — LACTATED RINGERS IV SOLN: Freq: Once | INTRAVENOUS | Status: DC

## 2022-05-13 MED ORDER — ROPIVACAINE HCL 2 MG/ML IJ SOLN
10.0000 mL | Freq: Once | INTRAMUSCULAR | Status: AC
  Administered 2022-05-13: 10 mL via PERINEURAL

## 2022-05-13 MED ORDER — MIDAZOLAM HCL 5 MG/5ML IJ SOLN: 0.5000 mg | Freq: Once | INTRAMUSCULAR | Status: DC

## 2022-05-13 MED ORDER — FENTANYL CITRATE (PF) 100 MCG/2ML IJ SOLN: 25.0000 ug | INTRAMUSCULAR | Status: DC | PRN

## 2022-05-13 MED ORDER — LIDOCAINE HCL 2 % IJ SOLN
20.0000 mL | Freq: Once | INTRAMUSCULAR | Status: AC
  Administered 2022-05-13: 400 mg

## 2022-05-13 MED ORDER — PENTAFLUOROPROP-TETRAFLUOROETH EX AERO
INHALATION_SPRAY | Freq: Once | CUTANEOUS | Status: AC
  Administered 2022-05-13: 30 via TOPICAL
  Filled 2022-05-13: qty 116

## 2022-05-13 NOTE — Progress Notes (Signed)
Safety precautions to be maintained throughout the outpatient stay will include: orient to surroundings, keep bed in low position, maintain call bell within reach at all times, provide assistance with transfer out of bed and ambulation.  

## 2022-05-13 NOTE — Patient Instructions (Signed)

## 2022-05-13 NOTE — Progress Notes (Signed)
PROVIDER NOTE: Interpretation of information contained herein should be left to medically-trained personnel. Specific patient instructions are provided elsewhere under "Patient Instructions" section of medical record. This document was created in part using STT-dictation technology, any transcriptional errors that may result from this process are unintentional.  Patient: Charlene [name] Type: Established DOB: Dec 21, 1960 MRN: [id number] PCP: Barbette Reichmann, MD  Service: Procedure DOS: 05/13/2022 Setting: Ambulatory Location: Ambulatory outpatient facility Delivery: Face-to-face Provider: Oswaldo Done, MD Specialty: Interventional Pain Management Specialty designation: 09 Location: Outpatient facility Ref. Prov.: Barbette Reichmann, MD       Interventional Therapy   Procedure: Lumbar Facet, Medial Branch Block(s) #4 (NO STEROIDS)  Laterality: Right  Level: L3, L4, L5, and S1 Medial Branch Level(s). Injecting these levels blocks the L4-5 and L5-S1 lumbar facet joints. Imaging: Fluoroscopic guidance         Anesthesia: Local anesthesia (1-2% Lidocaine) Anxiolysis: None                 Sedation: No Sedation                       DOS: 05/13/2022 Performed by: Oswaldo Done, MD  Primary Purpose: Diagnostic/Therapeutic Indications: Low back pain severe enough to impact quality of life or function. 1. Lumbar facet syndrome   2. Spondylosis without myelopathy or radiculopathy, lumbosacral region   3. Chronic low back pain (Bilateral) w/o sciatica   4. 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 Grade 1 Retrolisthesis of L2/L3   6. Lumbar facet hypertrophy   7. Lumbar facet arthropathy   8. DDD (degenerative disc disease), lumbar    Abnormal x-ray of lumbar spine (03/16/2021)    Uncontrolled type 2 diabetes mellitus with hyperglycemia    NAS-11 Pain score:   Pre-procedure: 1-2/10   Post-procedure: 0-No pain/10     Position / Prep / Materials:  Position: Prone   Prep solution: DuraPrep (Iodine Povacrylex [0.7% available iodine] and Isopropyl Alcohol, 74% w/w) Area Prepped: Posterolateral Lumbosacral Spine (Wide prep: From the lower border of the scapula down to the end of the tailbone and from flank to flank.)  Materials:  Tray: Block Needle(s):  Type: Spinal  Gauge (G): 22  Length: 3.5-in Qty: 3      Pre-op H&P Assessment:  [name] is a 62 y.o. (year old), female patient, seen today for interventional treatment. She  has a past surgical history that includes arthroscopic rotator cuff; Colonoscopy; Abdominal hysterectomy; endosopic sinus; and Cataract extraction w/PHACO (Right, 11/25/2017). [name] has a current medication list which includes the following prescription(s): amlodipine, carvedilol, cholecalciferol, freestyle libre sensor system, dicyclomine, duloxetine, hydralazine, toujeo max solostar, insulin lispro, lidocaine, lisinopril, metformin, naloxone, nortriptyline, pantoprazole, polyethylene glycol, pravastatin, pregabalin, sucralfate, tramadol, baclofen, and magnesium. Her primarily concern today is the Back Pain  Initial Vital Signs:  Pulse/HCG Rate: 81  Temp: (!) 97.5 F (36.4 C) Resp: 18 BP: (!) 149/86 SpO2: 100 %  BMI: Estimated body mass index is 29.29 kg/m as calculated from the following:   Height as of this encounter: 4\' 11"  (1.499 m).   Weight as of this encounter: 145 lb (65.8 kg).  Risk Assessment: Allergies: Reviewed. She is allergic to semaglutide.  Allergy Precautions: None required Coagulopathies: Reviewed. None identified.  Blood-thinner therapy: None at this time Active Infection(s): Reviewed. None identified. [name] is afebrile  Site Confirmation: [name] was asked to confirm the procedure and laterality before marking the site Procedure checklist: Completed Consent:  Before the procedure and under the influence of no sedative(s), amnesic(s), or anxiolytics, the patient was informed of the  treatment options, risks and possible complications. To fulfill our ethical and legal obligations, as recommended by the American Medical Association's Code of Ethics, I have informed the patient of my clinical impression; the nature and purpose of the treatment or procedure; the risks, benefits, and possible complications of the intervention; the alternatives, including doing nothing; the risk(s) and benefit(s) of the alternative treatment(s) or procedure(s); and the risk(s) and benefit(s) of doing nothing. The patient was provided information about the general risks and possible complications associated with the procedure. These may include, but are not limited to: failure to achieve desired goals, infection, bleeding, organ or nerve damage, allergic reactions, paralysis, and death. In addition, the patient was informed of those risks and complications associated to Spine-related procedures, such as failure to decrease pain; infection (i.e.: Meningitis, epidural or intraspinal abscess); bleeding (i.e.: epidural hematoma, subarachnoid hemorrhage, or any other type of intraspinal or peri-dural bleeding); organ or nerve damage (i.e.: Any type of peripheral nerve, nerve root, or spinal cord injury) with subsequent damage to sensory, motor, and/or autonomic systems, resulting in permanent pain, numbness, and/or weakness of one or several areas of the body; allergic reactions; (i.e.: anaphylactic reaction); and/or death. Furthermore, the patient was informed of those risks and complications associated with the medications. These include, but are not limited to: allergic reactions (i.e.: anaphylactic or anaphylactoid reaction(s)); adrenal axis suppression; blood sugar elevation that in diabetics may result in ketoacidosis or comma; water retention that in patients with history of congestive heart failure may result in shortness of breath, pulmonary edema, and decompensation with resultant heart failure; weight gain;  swelling or edema; medication-induced neural toxicity; particulate matter embolism and blood vessel occlusion with resultant organ, and/or nervous system infarction; and/or aseptic necrosis of one or more joints. Finally, the patient was informed that Medicine is not an exact science; therefore, there is also the possibility of unforeseen or unpredictable risks and/or possible complications that may result in a catastrophic outcome. The patient indicated having understood very clearly. We have given the patient no guarantees and we have made no promises. Enough time was given to the patient to ask questions, all of which were answered to the patient's satisfaction. Ms. Coote has indicated that she wanted to continue with the procedure. Attestation: I, the ordering provider, attest that I have discussed with the patient the benefits, risks, side-effects, alternatives, likelihood of achieving goals, and potential problems during recovery for the procedure that I have provided informed consent. Date  Time: 05/13/2022  9:02 AM   Pre-Procedure Preparation:  Monitoring: As per clinic protocol. Respiration, ETCO2, SpO2, BP, heart rate and rhythm monitor placed and checked for adequate function Safety Precautions: Patient was assessed for positional comfort and pressure points before starting the procedure. Time-out: I initiated and conducted the "Time-out" before starting the procedure, as per protocol. The patient was asked to participate by confirming the accuracy of the "Time Out" information. Verification of the correct person, site, and procedure were performed and confirmed by me, the nursing staff, and the patient. "Time-out" conducted as per Joint Commission's Universal Protocol (UP.01.01.01). Time: 0940 Start Time: 0940 hrs.  Description of Procedure:          Laterality: (see above) Targeted Levels: (see above)  Safety Precautions: Aspiration looking for blood return was conducted prior to all  injections. At no point did we inject any substances, as a needle was  being advanced. Before injecting, the patient was told to immediately notify me if she was experiencing any new onset of "ringing in the ears, or metallic taste in the mouth". No attempts were made at seeking any paresthesias. Safe injection practices and needle disposal techniques used. Medications properly checked for expiration dates. SDV (single dose vial) medications used. After the completion of the procedure, all disposable equipment used was discarded in the proper designated medical waste containers. Local Anesthesia: Protocol guidelines were followed. The patient was positioned over the fluoroscopy table. The area was prepped in the usual manner. The time-out was completed. The target area was identified using fluoroscopy. A 12-in long, straight, sterile hemostat was used with fluoroscopic guidance to locate the targets for each level blocked. Once located, the skin was marked with an approved surgical skin marker. Once all sites were marked, the skin (epidermis, dermis, and hypodermis), as well as deeper tissues (fat, connective tissue and muscle) were infiltrated with a small amount of a short-acting local anesthetic, loaded on a 10cc syringe with a 25G, 1.5-in  Needle. An appropriate amount of time was allowed for local anesthetics to take effect before proceeding to the next step. Local Anesthetic: Lidocaine 2.0% The unused portion of the local anesthetic was discarded in the proper designated containers. Technical description of process:   L3 Medial Branch Nerve Block (MBB): The target area for the L3 medial branch is at the junction of the postero-lateral aspect of the superior articular process and the superior, posterior, and medial edge of the transverse process of L4. Under fluoroscopic guidance, a Quincke needle was inserted until contact was made with os over the superior postero-lateral aspect of the pedicular shadow  (target area). After negative aspiration for blood, 0.5 mL of the nerve block solution was injected without difficulty or complication. The needle was removed intact. L4 Medial Branch Nerve Block (MBB): The target area for the L4 medial branch is at the junction of the postero-lateral aspect of the superior articular process and the superior, posterior, and medial edge of the transverse process of L5. Under fluoroscopic guidance, a Quincke needle was inserted until contact was made with os over the superior postero-lateral aspect of the pedicular shadow (target area). After negative aspiration for blood, 0.5 mL of the nerve block solution was injected without difficulty or complication. The needle was removed intact. L5 Medial Branch Nerve Block (MBB): The target area for the L5 medial branch is at the junction of the postero-lateral aspect of the superior articular process and the superior, posterior, and medial edge of the sacral ala. Under fluoroscopic guidance, a Quincke needle was inserted until contact was made with os over the superior postero-lateral aspect of the pedicular shadow (target area). After negative aspiration for blood, 0.5 mL of the nerve block solution was injected without difficulty or complication. The needle was removed intact. S1 Medial Branch Nerve Block (MBB): The target area for the S1 medial branch is at the posterior and inferior 6 o'clock position of the L5-S1 facet joint. Under fluoroscopic guidance, the Quincke needle inserted for the L5 MBB was redirected until contact was made with os over the inferior and postero aspect of the sacrum, at the 6 o' clock position under the L5-S1 facet joint (Target area). After negative aspiration for blood, 0.5 mL of the nerve block solution was injected without difficulty or complication. The needle was removed intact.  Once the entire procedure was completed, the treated area was cleaned, making sure to leave some of  the prepping solution  back to take advantage of its long term bactericidal properties.         Illustration of the posterior view of the lumbar spine and the posterior neural structures. Laminae of L2 through S1 are labeled. DPRL5, dorsal primary ramus of L5; DPRS1, dorsal primary ramus of S1; DPR3, dorsal primary ramus of L3; FJ, facet (zygapophyseal) joint L3-L4; I, inferior articular process of L4; LB1, lateral branch of dorsal primary ramus of L1; IAB, inferior articular branches from L3 medial branch (supplies L4-L5 facet joint); IBP, intermediate branch plexus; MB3, medial branch of dorsal primary ramus of L3; NR3, third lumbar nerve root; S, superior articular process of L5; SAB, superior articular branches from L4 (supplies L4-5 facet joint also); TP3, transverse process of L3.   Facet Joint Innervation (* possible contribution)  L1-2 T12, L1 (L2*)  Medial Branch  L2-3 L1, L2 (L3*)         "          "  L3-4 L2, L3 (L4*)         "          "  L4-5 L3, L4 (L5*)         "          "  L5-S1 L4, L5, S1          "          "    Vitals:   05/13/22 0902 05/13/22 0935 05/13/22 0940 05/13/22 0946  BP: (!) 149/86 (!) 159/90 (!) 164/90 (!) 160/88  Pulse: 81 88 83   Resp: 18 20 16 15   Temp: (!) 97.5 F (36.4 C)     TempSrc: Temporal     SpO2: 100% 100% 100% 100%  Weight: 145 lb (65.8 kg)     Height: 4\' 11"  (1.499 m)        End Time: 0944 hrs.  Imaging Guidance (Spinal):          Type of Imaging Technique: Fluoroscopy Guidance (Spinal) Indication(s): Assistance in needle guidance and placement for procedures requiring needle placement in or near specific anatomical locations not easily accessible without such assistance. Exposure Time: Please see nurses notes. Contrast: None used. Fluoroscopic Guidance: I was personally present during the use of fluoroscopy. "Tunnel Vision Technique" used to obtain the best possible view of the target area. Parallax error corrected before commencing the procedure.  "Direction-depth-direction" technique used to introduce the needle under continuous pulsed fluoroscopy. Once target was reached, antero-posterior, oblique, and lateral fluoroscopic projection used confirm needle placement in all planes. Images permanently stored in EMR. Interpretation: No contrast injected. I personally interpreted the imaging intraoperatively. Adequate needle placement confirmed in multiple planes. Permanent images saved into the patient's record.  Post-operative Assessment:  Post-procedure Vital Signs:  Pulse/HCG Rate: 83  Temp: (!) 97.5 F (36.4 C) Resp: 15 BP: (!) 160/88 SpO2: 100 %  EBL: None  Complications: No immediate post-treatment complications observed by team, or reported by patient.  Note: The patient tolerated the entire procedure well. A repeat set of vitals were taken after the procedure and the patient was kept under observation following institutional policy, for this type of procedure. Post-procedural neurological assessment was performed, showing return to baseline, prior to discharge. The patient was provided with post-procedure discharge instructions, including a section on how to identify potential problems. Should any problems arise concerning this procedure, the patient was given instructions to immediately contact us, at any time, without hesitation. In  any case, we plan to contact the patient by telephone for a follow-up status report regarding this interventional procedure.  Comments:  No additional relevant information.  Plan of Care (POC)  Orders:  Orders Placed This Encounter  Procedures   LUMBAR FACET(MEDIAL BRANCH NERVE BLOCK) MBNB    Scheduling Instructions:     Procedure: Lumbar facet block (AKA.: Lumbosacral medial branch nerve block)     Side: Right-sided     Level: L3-4, L4-5, L5-S1, and TBD Facets (L2, L3, L4, L5, S1, and TBD Medial Branch Nerves)     Sedation: Patient's choice.     Timeframe: Today    Order Specific Question:    Where will this procedure be performed?    Answer:   [facility]   DG [facility] C-ARM 1-60 MIN NO REPORT    Intraoperative interpretation by procedural physician at [facility].    Standing Status:   Standing    Number of Occurrences:   1    Order Specific Question:   Reason for exam:    Answer:   Assistance in needle guidance and placement for procedures requiring needle placement in or near specific anatomical locations not easily accessible without such assistance.   Informed Consent Details: Physician/Practitioner Attestation; Transcribe to consent form and obtain patient signature    Nursing Order: Transcribe to consent form and obtain patient signature. Note: Always confirm laterality of pain with Ms. Joelene Millin, before procedure.    Order Specific Question:   Physician/Practitioner attestation of informed consent for procedure/surgical case    Answer:   I, the physician/practitioner, attest that I have discussed with the patient the benefits, risks, side effects, alternatives, likelihood of achieving goals and potential problems during recovery for the procedure that I have provided informed consent.    Order Specific Question:   Procedure    Answer:   Lumbar Facet Block  under fluoroscopic guidance    Order Specific Question:   Physician/Practitioner performing the procedure    Answer:   Karel Turpen A. Laban Emperor MD    Order Specific Question:   Indication/Reason    Answer:   Low Back Pain, with our without leg pain, due to Facet Joint Arthralgia (Joint Pain) Spondylosis (Arthritis of the Spine), without myelopathy or radiculopathy (Nerve Damage).   Provide equipment / supplies at bedside    Procedure tray: "Block Tray" (Disposable  single use) Skin infiltration needle: Regular 1.5-in, 25-G, (x1) Block Needle type: Spinal Amount/quantity: 4 Size: Regular (3.5-inch) Gauge: 22G    Standing Status:   Standing    Number of Occurrences:   1    Order Specific Question:    Specify    Answer:   Block Tray   Chronic Opioid Analgesic:  Tramadol 50 mg, 1 tab PO QD. ABNORMAL UDS (09/03/2020) (+) for COCAINE. MME: 5 mg/day   Medications ordered for procedure: Meds ordered this encounter  Medications   lidocaine (XYLOCAINE) 2 % (with pres) injection 400 mg   pentafluoroprop-tetrafluoroeth (GEBAUERS) aerosol   DISCONTD: lactated ringers infusion   DISCONTD: midazolam (VERSED) 5 MG/5ML injection 0.5-2 mg    Make sure Flumazenil is available in the pyxis when using this medication. If oversedation occurs, administer 0.2 mg IV over 15 sec. If after 45 sec no response, administer 0.2 mg again over 1 min; may repeat at 1 min intervals; not to exceed 4 doses (1 mg)   DISCONTD: fentaNYL (SUBLIMAZE) injection 25-50 mcg    Make sure Narcan is available in the pyxis when  using this medication. In the event of respiratory depression (RR< 8/min): Titrate NARCAN (naloxone) in increments of 0.1 to 0.2 mg IV at 2-3 minute intervals, until desired degree of reversal.   ropivacaine (PF) 2 mg/mL (0.2%) (NAROPIN) injection 10 mL   Medications administered: We administered lidocaine, pentafluoroprop-tetrafluoroeth, and ropivacaine (PF) 2 mg/mL (0.2%).  See the medical record for exact dosing, route, and time of administration.  Follow-up plan:   Return in about 2 weeks (around 05/27/2022) for Proc-day (T,Th), (Face2F), (PPE).       Interventional Therapies  Risk Factors  Considerations:   NOTE: UNCONTROLLED IDDM (NO STEROIDS)  ABNORMAL UDS (09/03/2020) (+) for COCAINE       Planned  Pending:  (NO STEROIDS)  Diagnostic right L4-5 and L5-S1 lumbar facet MBB (no steroids)  Diagnostic left genicular nerve block #1 (no steroids) (to be done after lumbar facet block.) Therapeutic left IA Gelsyn-3 knee inj. #3 + left L3-4 LESI #1 (w/o steroids)    Under consideration:   Possible bilateral lumbar facet RFA  Diagnostic bilateral genicular NB    Completed:   Diagnostic right  lumbar facet MBB x3 (no steroid) (03/26/2021) (100/100/50/50)  Diagnostic left lumbar facet MBB x1 (w/ steroid) (10/09/2020) (100/100/90/L-100; R-50)  Therapeutic left L5 TFESI x4 (09/21/2018) (100/100/50/70)  Therapeutic left L4-5 LESI x4 (09/21/2018) (100/100/50/70)  Therapeutic left L5-S1 LESI x2 (02/25/2018) (100/100/100/100)  Therapeutic bilateral Hyalgan knee inj. x5 (02/25/2018) (100/100/100 x2 weeks/<50)  Therapeutic left IA steroid knee injection x1 (11/23/2017) (100/100/0/0)  Therapeutic left IA Gelsyn-3 knee inj. x2    Therapeutic  Palliative (PRN) options:  (NO PRN PROCEDURES W/O MD F2F EVALUATION) Palliative L5 TFESI  Palliative L4-5 LESI   Palliative L5-S1 LESI   Palliative Hyalgan knee inj.      Recent Visits Date Type Provider Dept  04/23/22 Office Visit Delano Metz, MD [facility]  04/01/22 Office Visit Delano Metz, MD [facility]  03/18/22 Procedure visit Delano Metz, MD [facility]  02/26/22 Office Visit Delano Metz, MD [facility]  Showing recent visits within past 90 days and meeting all other requirements Today's Visits Date Type Provider Dept  05/13/22 Procedure visit Delano Metz, MD [facility]  Showing today's visits and meeting all other requirements Future Appointments Date Type Provider Dept  05/29/22 Appointment Delano Metz, MD [facility]  Showing future appointments within next 90 days and meeting all other requirements  Disposition: Discharge home  Discharge (Date  Time): 05/13/2022; 0950 hrs.   Primary Care Physician: Barbette Reichmann, MD Location: [facility] Note by: Oswaldo Done, MD (TTS technology used. I apologize for any typographical errors that were not detected and corrected.) Date: 05/13/2022; Time: 9:54 AM  Disclaimer:  Medicine is not an Visual merchandiser. The only guarantee in medicine is that nothing is  guaranteed. It is important to note that the decision to proceed with this intervention was based on the information collected from the patient. The Data and conclusions were drawn from the patient's questionnaire, the interview, and the physical examination. Because the information was provided in large part by the patient, it cannot be guaranteed that it has not been purposely or unconsciously manipulated. Every effort has been made to obtain as much relevant data as possible for this evaluation. It is important to note that the conclusions that lead to this procedure are derived in large part from the available data. Always take into account that the treatment will also be dependent on  availability of resources and existing treatment guidelines, considered by other Pain Management Practitioners as being common knowledge and practice, at the time of the intervention. For Medico-Legal purposes, it is also important to point out that variation in procedural techniques and pharmacological choices are the acceptable norm. The indications, contraindications, technique, and results of the above procedure should only be interpreted and judged by a Board-Certified Interventional Pain Specialist with extensive familiarity and expertise in the same exact procedure and technique.

## 2022-05-14 ENCOUNTER — Telehealth: Payer: Self-pay | Admitting: *Deleted

## 2022-05-14 NOTE — Telephone Encounter (Signed)
Attempted to call for post procedure follow-up. Unable to connect with phone number provided.

## 2022-05-29 ENCOUNTER — Ambulatory Visit: Payer: Medicare Other | Attending: Pain Medicine | Admitting: Pain Medicine

## 2022-05-29 ENCOUNTER — Encounter: Payer: Self-pay | Admitting: Pain Medicine

## 2022-05-29 VITALS — Temp 98.6°F | Ht 59.0 in | Wt 145.0 lb

## 2022-05-29 DIAGNOSIS — M5459 Other low back pain: Secondary | ICD-10-CM

## 2022-05-29 DIAGNOSIS — M79605 Pain in left leg: Secondary | ICD-10-CM | POA: Insufficient documentation

## 2022-05-29 DIAGNOSIS — M545 Low back pain, unspecified: Secondary | ICD-10-CM | POA: Diagnosis present

## 2022-05-29 DIAGNOSIS — M47816 Spondylosis without myelopathy or radiculopathy, lumbar region: Secondary | ICD-10-CM

## 2022-05-29 DIAGNOSIS — E1142 Type 2 diabetes mellitus with diabetic polyneuropathy: Secondary | ICD-10-CM | POA: Diagnosis present

## 2022-05-29 DIAGNOSIS — Z7984 Long term (current) use of oral hypoglycemic drugs: Secondary | ICD-10-CM

## 2022-05-29 DIAGNOSIS — Z794 Long term (current) use of insulin: Secondary | ICD-10-CM

## 2022-05-29 DIAGNOSIS — M79604 Pain in right leg: Secondary | ICD-10-CM

## 2022-05-29 DIAGNOSIS — G8929 Other chronic pain: Secondary | ICD-10-CM | POA: Insufficient documentation

## 2022-05-29 DIAGNOSIS — R208 Other disturbances of skin sensation: Secondary | ICD-10-CM

## 2022-05-29 DIAGNOSIS — Z09 Encounter for follow-up examination after completed treatment for conditions other than malignant neoplasm: Secondary | ICD-10-CM

## 2022-05-29 NOTE — Patient Instructions (Signed)
Capsaicin Patches What is this medication? CAPSAICIN (cap SAY sin) relieves minor pain in your muscles and joints. It works by making your skin feel warm or cool, which blocks pain signals going to the brain. This medicine may be used for other purposes; ask your health care provider or pharmacist if you have questions. COMMON BRAND NAME(S): Qutenza What should I tell my care team before I take this medication? They need to know if you have any of these conditions: Broken or irritated skin High blood pressure History of heart attack or stroke An unusual or allergic reaction to capsaicin, hot peppers, other medications, foods, dyes, or preservatives Pregnant or trying to get pregnant Breast-feeding How should I use this medication? This medication is for external use only. It is applied by your care team in a hospital or clinic setting. Talk to your care team about the use of this medication in children. Special care may be needed. Overdosage: If you think you have taken too much of this medicine contact a poison control center or emergency room at once. NOTE: This medicine is only for you. Do not share this medicine with others. What if I miss a dose? This does not apply. What may interact with this medication? Interactions are not expected. Do not use any other skin products on the affected area without asking your care team. This list may not describe all possible interactions. Give your health care provider a list of all the medicines, herbs, non-prescription drugs, or dietary supplements you use. Also tell them if you smoke, drink alcohol, or use illegal drugs. Some items may interact with your medicine. What should I watch for while using this medication? Your condition will be monitored carefully while you are receiving this medication. Your blood pressure may go up during the procedure. Do not touch the medication patch during treatment. This medication causes red, burning skin. You  may need pain medication for during and after the procedure. This medication can make you more sensitive to heat for a few days after treatment. Be careful in hot showers or baths. Keep out of the sun. Exercise may make the treated skin feel hotter. Tell your care team if your symptoms do not start to get better or if they get worse. What side effects may I notice from receiving this medication? Side effects that you should report to your care team as soon as possible: Allergic reactions--skin rash, itching, hives, swelling of the face, lips, tongue, or throat Side effects that usually do not require medical attention (report these to your care team if they continue or are bothersome): Mild skin irritation, redness, or dryness This list may not describe all possible side effects. Call your doctor for medical advice about side effects. You may report side effects to FDA at 1-800-FDA-1088. Where should I keep my medication? This medication is given in a hospital or clinic. It will not be stored at home. NOTE: This sheet is a summary. It may not cover all possible information. If you have questions about this medicine, talk to your doctor, pharmacist, or health care provider.  2023 Elsevier/Gold Standard (2020-09-05 00:00:00)  

## 2022-05-29 NOTE — Progress Notes (Signed)
Safety precautions to be maintained throughout the outpatient stay will include: orient to surroundings, keep bed in low position, maintain call bell within reach at all times, provide assistance with transfer out of bed and ambulation.  

## 2022-05-29 NOTE — Progress Notes (Signed)
PROVIDER NOTE: Information contained herein reflects review and annotations entered in association with encounter. Interpretation of such information and data should be left to medically-trained personnel. Information provided to patient can be located elsewhere in the medical record under "Patient Instructions". Document created using STT-dictation technology, any transcriptional errors that may result from process are unintentional.    Patient: Charlene [name]  Service Category: E/M  Provider: Oswaldo Done, MD  DOB: [birth year]  DOS: 05/29/2022  Referring Provider: Barbette Reichmann, MD  MRN: [id number]  Specialty: Interventional Pain Management  PCP: Barbette Reichmann, MD  Type: Established Patient  Setting: Ambulatory outpatient    Location: Office  Delivery: Face-to-face     HPI  Ms. ETOLA AMANN, a 62 y.o. year old female, is here today because of her Chronic bilateral low back pain without sciatica [M54.50, G89.29]. Ms. Kolin primary complain today is Back Pain  Pertinent problems: Ms. Goodspeed has Burning sensation of feet; Neuropathic pain; Neuropathy; Lower extremity numbness and tingling (Left); Polyneuropathy associated with underlying disease (HCC); Chronic ankle pain (Left); Chronic foot pain (Left); Chronic lower extremity pain (2ry area of Pain) (Bilateral) (L>R); Chronic knee pain (4th area of Pain) (Bilateral) (L>R); Chronic low back pain (3ry area of Pain) (Bilateral) (L>R) w/ sciatica (Left); Chronic pain syndrome; DM type 2 with diabetic peripheral neuropathy (HCC); DDD (degenerative disc disease), lumbar; Lumbar facet arthropathy; Lumbar facet syndrome; Lumbar facet hypertrophy; Lumbar foraminal stenosis (L5-S1) (Right); Osteoarthritis of knee (Bilateral); Chronic musculoskeletal pain; Abnormal EMG (07/06/2017); Osteoarthritis involving multiple joints; 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); Knee pain; Back pain with left-sided sciatica; Meningioma  (HCC); Neck pain; Chronic pain disorder; Left leg weakness; Meralgia paraesthetica, left; Pain in both lower extremities; Arthropathy of lumbar facet joint; Chronic ankle and foot pain (1ry area of Pain) (Left); 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 Pain Assessment: Severity of Chronic pain is reported as a 0-No pain/10. Location: Back Lower, Right, Left/Radaites from lower back into front of thigh into left knee. Onset: More than a month ago. Quality: Dull. Timing: Intermittent. Modifying factor(s): Procedure, tylenol, sitting, and laying down. Vitals:  height is 4\' 11"  (1.499 m) and weight is 145 lb (65.8 kg). Her temporal temperature is 98.6 F (37 C).  BMI: Estimated body mass index is 29.29 kg/m as calculated from the following:   Height as of this encounter: 4\' 11"  (1.499 m).   Weight as of this encounter: 145 lb (65.8 kg). Last encounter: 04/23/2022. Last procedure: 05/13/2022.  Reason for encounter: post-procedure evaluation and assessment.  This by the fact that we have not been including any steroids in her lumbar facet blocks, the patient still has continued to enjoy prolonged benefits from the injection of the local anesthetics.  She indicates having attained 100% relief of the pain for the duration of the local anesthetic followed by an ongoing 75% improvement.  Post-procedure evaluation   Procedure: Lumbar Facet, Medial  Branch Block(s) #4 (NO STEROIDS)  Laterality: Right  Level: L3, L4, L5, and S1 Medial Branch Level(s). Injecting  these levels blocks the L4-5 and L5-S1 lumbar facet joints. Imaging: Fluoroscopic guidance         Anesthesia: Local anesthesia (1-2% Lidocaine) Anxiolysis: None                 Sedation: No Sedation                       DOS: 05/13/2022 Performed by: Oswaldo Done, MD  Primary Purpose: Diagnostic/Therapeutic Indications: Low back pain severe enough to impact quality of life or function. 1. Lumbar facet syndrome   2. Spondylosis without myelopathy or radiculopathy, lumbosacral region   3. Chronic low back pain (Bilateral) w/o sciatica   4. 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 Grade 1 Retrolisthesis of L2/L3   6. Lumbar facet hypertrophy   7. Lumbar facet arthropathy   8. DDD (degenerative disc disease), lumbar    Abnormal x-ray of lumbar spine (03/16/2021)    Uncontrolled type 2 diabetes mellitus with hyperglycemia    NAS-11 Pain score:   Pre-procedure: 1-2/10   Post-procedure: 0-No pain/10      Effectiveness:  Initial hour after procedure: 100 %. Subsequent 4-6 hours post-procedure: 100 %. Analgesia past initial 6 hours: 75 %. Ongoing improvement:  Analgesic: The patient indicates having an ongoing 75% improvement of her low back pain. Function: Ms. Deguire reports improvement in function ROM: Ms. Posten reports improvement in ROM  Pharmacotherapy Assessment  Analgesic: Tramadol 50 mg, 1 tab PO QD. ABNORMAL UDS (09/03/2020) (+) for COCAINE. MME: 5 mg/day   Monitoring: [place] PMP: PDMP reviewed during this encounter.       Pharmacotherapy: No side-effects or adverse reactions reported. Compliance: No problems identified. Effectiveness: Clinically acceptable.  Earlyne Iba, RN  05/29/2022  1:41 PM  Sign when Signing Visit Safety precautions to be maintained throughout the outpatient stay will include: orient to surroundings, keep bed in low  position, maintain call bell within reach at all times, provide assistance with transfer out of bed and ambulation.     No results found for: "CBDTHCR" No results found for: "D8THCCBX" No results found for: "D9THCCBX"  UDS:  Summary  Date Value Ref Range Status  02/26/2022 Note  Final    Comment:    ==================================================================== [employer] Select 13 (MW) ==================================================================== Test                             Result       Flag       Units  Drug Present and Declared for Prescription Verification   Tramadol                       4758         EXPECTED   ng/mg creat   O-Desmethyltramadol            >6173        EXPECTED   ng/mg creat   N-Desmethyltramadol            1183         EXPECTED   ng/mg creat    Source of tramadol is a prescription medication. O-desmethyltramadol    and N-desmethyltramadol are expected metabolites of tramadol.  ==================================================================== Test                      Result    Flag   Units  Ref Range   Creatinine              81               mg/dL      >=29 ==================================================================== Declared Medications:  The flagging and interpretation on this report are based on the  following declared medications.  Unexpected results may arise from  inaccuracies in the declared medications.   **Note: The testing scope of this panel includes these medications:   Tramadol (Ultram)   **Note: The testing scope of this panel does not include the  following reported medications:   Amlodipine (Norvasc)  Baclofen (Lioresal)  Carvedilol (Coreg)  Cholecalciferol  Dicyclomine (Bentyl)  Duloxetine (Cymbalta)  Hydralazine (Apresoline)  Insulin (Toujeo)  Lisinopril (Zestril)  Magnesium  Metformin (Glucophage)  Nortriptyline (Pamelor)  Pantoprazole (Protonix)  Polyethylene Glycol (MiraLAX)  Pravastatin  (Pravachol)  Pregabalin (Lyrica)  Sucralfate (Carafate)  Topical Lidocaine (Lidoderm) ==================================================================== For clinical consultation, please call [phone]. ====================================================================       ROS  Constitutional: Denies any fever or chills Gastrointestinal: No reported hemesis, hematochezia, vomiting, or acute GI distress Musculoskeletal: Denies any acute onset joint swelling, redness, loss of ROM, or weakness Neurological: No reported episodes of acute onset apraxia, aphasia, dysarthria, agnosia, amnesia, paralysis, loss of coordination, or loss of consciousness  Medication Review  Cholecalciferol, DULoxetine, FreeStyle Libre Sensor System, Magnesium, amLODipine, baclofen, carvedilol, dicyclomine, hydrALAZINE, insulin glargine (2 Unit Dial), insulin lispro, lidocaine, lisinopril, metFORMIN, naloxone, nortriptyline, pantoprazole, polyethylene glycol, pravastatin, pregabalin, sucralfate, and traMADol  History Review  Allergy: Ms. Timp is allergic to semaglutide. Drug: Ms. Stones  reports no history of drug use. Alcohol:  reports that she does not currently use alcohol. Tobacco:  reports that she has never smoked. She has never used smokeless tobacco. Social: Ms. Wesselmann  reports that she has never smoked. She has never used smokeless tobacco. She reports that she does not currently use alcohol. She reports that she does not use drugs. Medical:  has a past medical history of Arthritis, Degenerative disc disease, lumbar, Diabetes mellitus without complication (HCC), GERD (gastroesophageal reflux disease), Hypertension, Neuropathy, Polyneuropathy, and Polyneuropathy. Surgical: Ms. Friederichs  has a past surgical history that includes arthroscopic rotator cuff; Colonoscopy; Abdominal hysterectomy; endosopic sinus; and Cataract extraction w/PHACO (Right, 11/25/2017). Family: family history includes Alcohol  abuse in her father; Breast cancer in her maternal aunt; Cancer in her father.  Laboratory Chemistry Profile   Renal Lab Results  Component Value Date   BUN 13 12/23/2018   CREATININE 0.59 12/23/2018   BCR 17 10/12/2017   GFRAA >60 12/23/2018   GFRNONAA >60 12/23/2018    Hepatic Lab Results  Component Value Date   AST 17 10/21/2018   ALT 21 10/21/2018   ALBUMIN 3.6 10/21/2018   ALKPHOS 81 10/21/2018   LIPASE 20 10/21/2018    Electrolytes Lab Results  Component Value Date   NA 137 12/23/2018   K 4.1 12/23/2018   CL 100 12/23/2018   CALCIUM 9.8 12/23/2018   MG 2.0 10/12/2017    Bone Lab Results  Component Value Date   25OHVITD1 17 (L) 10/12/2017   25OHVITD2 <1.0 10/12/2017   25OHVITD3 17 10/12/2017    Inflammation (CRP: Acute Phase) (ESR: Chronic Phase) Lab Results  Component Value Date   CRP 4 10/12/2017   ESRSEDRATE 12 10/12/2017         Note: Above Lab results reviewed.  Recent Imaging Review  DG PAIN CLINIC C-ARM 1-60 MIN NO REPORT Fluoro was  used, but no Radiologist interpretation will be provided.  Please refer to "NOTES" tab for provider progress note. Note: Reviewed        Physical Exam  General appearance: Well nourished, well developed, and well hydrated. In no apparent acute distress Mental status: Alert, oriented x 3 (person, place, & time)       Respiratory: No evidence of acute respiratory distress Eyes: PERLA Vitals: Temp 98.6 F (37 C) (Temporal)   Ht 4\' 11"  (1.499 m)   Wt 145 lb (65.8 kg)   BMI 29.29 kg/m  BMI: Estimated body mass index is 29.29 kg/m as calculated from the following:   Height as of this encounter: 4\' 11"  (1.499 m).   Weight as of this encounter: 145 lb (65.8 kg). Ideal: Patient must be at least 60 in tall to calculate ideal body weight  Assessment   Diagnosis Status  1. Chronic low back pain (Bilateral) w/o sciatica   2. Lumbar facet joint pain   3. Lumbar facet syndrome   4. Diabetic sensory polyneuropathy  (HCC) (by NCT)   5. Burning sensation of feet   6. DM type 2 with diabetic peripheral neuropathy (HCC)   7. Pain in both lower extremities   8. Postop check    Controlled Controlled Controlled   Updated Problems: No problems updated.   Plan of Care  Problem-specific:  No problem-specific Assessment & Plan notes found for this encounter.  Ms. NEISHA REAMES has a current medication list which includes the following long-term medication(s): amlodipine, carvedilol, cholecalciferol, dicyclomine, duloxetine, hydralazine, toujeo max solostar, insulin lispro, lisinopril, nortriptyline, pantoprazole, pravastatin, pregabalin, sucralfate, tramadol, baclofen, and magnesium.  Pharmacotherapy (Medications Ordered): No orders of the defined types were placed in this encounter.  Orders:  No orders of the defined types were placed in this encounter.  Follow-up plan:   No follow-ups on file.      Interventional Therapies  Risk Factors  Considerations:   NOTE: UNCONTROLLED IDDM (NO STEROIDS)  ABNORMAL UDS (09/03/2020) (+) for COCAINE       Planned  Pending:  (NO STEROIDS)  Diagnostic right L4-5 and L5-S1 lumbar facet MBB (no steroids)  Diagnostic left genicular nerve block #1 (no steroids) (to be done after lumbar facet block.) Therapeutic left IA Gelsyn-3 knee inj. #3 + left L3-4 LESI #1 (w/o steroids)    Under consideration:   Possible bilateral lumbar facet RFA  Diagnostic bilateral genicular NB    Completed:   Diagnostic right lumbar facet MBB x3 (no steroid) (03/26/2021) (100/100/50/50)  Diagnostic left lumbar facet MBB x1 (w/ steroid) (10/09/2020) (100/100/90/L-100; R-50)  Therapeutic left L5 TFESI x4 (09/21/2018) (100/100/50/70)  Therapeutic left L4-5 LESI x4 (09/21/2018) (100/100/50/70)  Therapeutic left L5-S1 LESI x2 (02/25/2018) (100/100/100/100)  Therapeutic bilateral Hyalgan knee inj. x5 (02/25/2018) (100/100/100 x2 weeks/<50)  Therapeutic left IA steroid knee injection x1  (11/23/2017) (100/100/0/0)  Therapeutic left IA Gelsyn-3 knee inj. x2    Therapeutic  Palliative (PRN) options:  (NO PRN PROCEDURES W/O MD F2F EVALUATION) Palliative L5 TFESI  Palliative L4-5 LESI   Palliative L5-S1 LESI   Palliative Hyalgan knee inj.       Recent Visits Date Type Provider Dept  05/29/22 Office Visit Delano Metz, MD [facility]  05/13/22 Procedure visit Delano Metz, MD [facility]  04/23/22 Office Visit Delano Metz, MD [facility]  04/01/22 Office Visit Delano Metz, MD [facility]  03/18/22 Procedure visit Delano Metz, MD [facility]  Showing recent visits within past  90 days and meeting all other requirements Future Appointments No visits were found meeting these conditions. Showing future appointments within next 90 days and meeting all other requirements  I discussed the assessment and treatment plan with the patient. The patient was provided an opportunity to ask questions and all were answered. The patient agreed with the plan and demonstrated an understanding of the instructions.  Patient advised to call back or seek an in-person evaluation if the symptoms or condition worsens.  Duration of encounter: 30 minutes.  Total time on encounter, as per AMA guidelines included both the face-to-face and non-face-to-face time personally spent by the physician and/or other qualified health care professional(s) on the day of the encounter (includes time in activities that require the physician or other qualified health care professional and does not include time in activities normally performed by clinical staff). Physician's time may include the following activities when performed: Preparing to see the patient (e.g., pre-charting review of records, searching for previously ordered imaging, lab work, and nerve conduction tests) Review of prior analgesic pharmacotherapies. Reviewing  PMP Interpreting ordered tests (e.g., lab work, imaging, nerve conduction tests) Performing post-procedure evaluations, including interpretation of diagnostic procedures Obtaining and/or reviewing separately obtained history Performing a medically appropriate examination and/or evaluation Counseling and educating the patient/family/caregiver Ordering medications, tests, or procedures Referring and communicating with other health care professionals (when not separately reported) Documenting clinical information in the electronic or other health record Independently interpreting results (not separately reported) and communicating results to the patient/ family/caregiver Care coordination (not separately reported)  Note by: Oswaldo Done, MD Date: 05/29/2022; Time: 6:48 AM

## 2022-06-27 ENCOUNTER — Ambulatory Visit: Payer: Medicare Other

## 2022-06-27 DIAGNOSIS — R195 Other fecal abnormalities: Secondary | ICD-10-CM

## 2022-06-27 DIAGNOSIS — Z1211 Encounter for screening for malignant neoplasm of colon: Secondary | ICD-10-CM

## 2022-06-27 DIAGNOSIS — K640 First degree hemorrhoids: Secondary | ICD-10-CM

## 2022-07-01 ENCOUNTER — Ambulatory Visit
Admission: RE | Admit: 2022-07-01 | Discharge: 2022-07-01 | Disposition: A | Discharge status: Home / Self Care | Payer: Medicare Other | Source: Ambulatory Visit | Attending: Internal Medicine | Admitting: Internal Medicine

## 2022-07-01 DIAGNOSIS — Z1231 Encounter for screening mammogram for malignant neoplasm of breast: Secondary | ICD-10-CM | POA: Diagnosis not present

## 2022-07-05 ENCOUNTER — Emergency Department: Payer: Medicare Other

## 2022-07-05 ENCOUNTER — Emergency Department
Admission: EM | Admit: 2022-07-05 | Discharge: 2022-07-05 | Disposition: A | Discharge status: Home / Self Care | Payer: Medicare Other | Attending: Student in an Organized Health Care Education/Training Program | Admitting: Student in an Organized Health Care Education/Training Program

## 2022-07-05 ENCOUNTER — Other Ambulatory Visit: Payer: Self-pay

## 2022-07-05 DIAGNOSIS — N3001 Acute cystitis with hematuria: Secondary | ICD-10-CM | POA: Insufficient documentation

## 2022-07-05 DIAGNOSIS — Z794 Long term (current) use of insulin: Secondary | ICD-10-CM | POA: Diagnosis not present

## 2022-07-05 DIAGNOSIS — R739 Hyperglycemia, unspecified: Secondary | ICD-10-CM | POA: Insufficient documentation

## 2022-07-05 LAB — CBC
HCT: 32.1 % — ABNORMAL LOW (ref 36.0–46.0)
Hemoglobin: 10 g/dL — ABNORMAL LOW (ref 12.0–15.0)
MCH: 26.8 pg (ref 26.0–34.0)
MCHC: 31.2 g/dL (ref 30.0–36.0)
MCV: 86.1 fL (ref 80.0–100.0)
Platelets: 339 10*3/uL (ref 150–400)
RBC: 3.73 MIL/uL — ABNORMAL LOW (ref 3.87–5.11)
RDW: 14.8 % (ref 11.5–15.5)
WBC: 8.3 10*3/uL (ref 4.0–10.5)
nRBC: 0 % (ref 0.0–0.2)

## 2022-07-05 LAB — BASIC METABOLIC PANEL
Anion gap: 12 (ref 5–15)
BUN: 24 mg/dL — ABNORMAL HIGH (ref 6–20)
CO2: 22 mmol/L (ref 22–32)
Calcium: 9 mg/dL (ref 8.9–10.3)
Chloride: 103 mmol/L (ref 98–111)
Creatinine, Ser: 1.58 mg/dL — ABNORMAL HIGH (ref 0.44–1.00)
GFR, Estimated: 37 mL/min — ABNORMAL LOW (ref >60)
Glucose, Bld: 182 mg/dL — ABNORMAL HIGH (ref 70–99)
Potassium: 3.2 mmol/L — ABNORMAL LOW (ref 3.5–5.1)
Sodium: 137 mmol/L (ref 135–145)

## 2022-07-05 LAB — URINALYSIS, ROUTINE W REFLEX MICROSCOPIC
Bilirubin Urine: NEGATIVE
Glucose, UA: 50 mg/dL — AB
Hgb urine dipstick: NEGATIVE
Ketones, ur: NEGATIVE mg/dL
Nitrite: NEGATIVE
Protein, ur: 100 mg/dL — AB
Specific Gravity, Urine: 1.016 (ref 1.005–1.030)
WBC, UA: >50 WBC/hpf (ref 0–5)
pH: 5 (ref 5.0–8.0)

## 2022-07-05 LAB — CBG MONITORING, ED: Glucose-Capillary: 178 mg/dL — ABNORMAL HIGH (ref 70–99)

## 2022-07-05 MED ORDER — CEPHALEXIN 500 MG PO CAPS
500.0000 mg | ORAL_CAPSULE | Freq: Once | ORAL | Status: AC
  Administered 2022-07-05: 500 mg via ORAL
  Filled 2022-07-05: qty 1

## 2022-07-05 MED ORDER — CEPHALEXIN 500 MG PO CAPS: 500.0000 mg | ORAL_CAPSULE | Freq: Two times a day (BID) | ORAL | 0 refills | Status: AC

## 2022-07-05 NOTE — ED Triage Notes (Signed)
Pt to ed from home via POV for weakness due to her BGL dropping. Pt spoke with her endocrinologist yesterday and they advised her to decrease long acting to 15 units. Pt states her BGL dropped to 44 yesterday and is reading high now. Pt is CAOx4, in no acute distress and ambulatory in triage.

## 2022-07-05 NOTE — ED Provider Notes (Signed)
[facility] Provider Note    Event Date/Time   First MD Initiated Contact with Patient 07/05/22 2043     (approximate)   History   Hypoglycemia   HPI  Charlene [name] is a 62 y.o. female who presents to the ER for evaluation concern for abnormal blood sugar readings.  Has been having issues with low morning blood sugar readings over the past week was instructed to decrease to her long-acting nightly insulin now is having some issues with higher readings in the 100s.  No nausea or vomiting.  Has had some flank pain and some increased urinary frequency no measured fevers or chills.     Physical Exam   Triage Vital Signs: ED Triage Vitals  Enc Vitals Group     BP 07/05/22 1854 (!) 143/81     Pulse Rate 07/05/22 1854 97     Resp 07/05/22 1854 18     Temp 07/05/22 1854 98.3 F (36.8 C)     Temp Source 07/05/22 1854 Oral     SpO2 07/05/22 1854 98 %     Weight 07/05/22 1908 143 lb 4.8 oz (65 kg)     Height 07/05/22 1908 4\' 11"  (1.499 m)     Head Circumference --      Peak Flow --      Pain Score 07/05/22 1908 0     Pain Loc --      Pain Edu? --      Excl. in GC? --     Most recent vital signs: Vitals:   07/05/22 1854  BP: (!) 143/81  Pulse: 97  Resp: 18  Temp: 98.3 F (36.8 C)  SpO2: 98%     Constitutional: Alert  Eyes: Conjunctivae are normal.  Head: Atraumatic. Nose: No congestion/rhinnorhea. Mouth/Throat: Mucous membranes are moist.   Neck: Painless ROM.  Cardiovascular:   Good peripheral circulation. Respiratory: Normal respiratory effort.  No retractions.  Gastrointestinal: Soft and nontender.  Musculoskeletal:  no deformity Neurologic:  MAE spontaneously. No gross focal neurologic deficits are appreciated.  Skin:  Skin is warm, dry and intact. No rash noted. Psychiatric: Mood and affect are normal. Speech and behavior are normal.    ED Results / Procedures / Treatments   Labs (all labs ordered are listed, but only  abnormal results are displayed) Labs Reviewed  BASIC METABOLIC PANEL - Abnormal; Notable for the following components:      Result Value   Potassium 3.2 (*)    Glucose, Bld 182 (*)    BUN 24 (*)    Creatinine, Ser 1.58 (*)    GFR, Estimated 37 (*)    All other components within normal limits  CBC - Abnormal; Notable for the following components:   RBC 3.73 (*)    Hemoglobin 10.0 (*)    HCT 32.1 (*)    All other components within normal limits  URINALYSIS, ROUTINE W REFLEX MICROSCOPIC - Abnormal; Notable for the following components:   Color, Urine YELLOW (*)    APPearance CLOUDY (*)    Glucose, UA 50 (*)    Protein, ur 100 (*)    Leukocytes,Ua LARGE (*)    Bacteria, UA RARE (*)    All other components within normal limits  CBG MONITORING, ED - Abnormal; Notable for the following components:   Glucose-Capillary 178 (*)    All other components within normal limits     EKG     RADIOLOGY Please see ED Course for my review  and interpretation.  I personally reviewed all radiographic images ordered to evaluate for the above acute complaints and reviewed radiology reports and findings.  These findings were personally discussed with the patient.  Please see medical record for radiology report.    PROCEDURES:  Critical Care performed: No  Procedures   MEDICATIONS ORDERED IN ED: Medications  cephALEXin (KEFLEX) capsule 500 mg (500 mg Oral Given 07/05/22 2138)     IMPRESSION / MDM / ASSESSMENT AND PLAN / ED COURSE  I reviewed the triage vital signs and the nursing notes.                              Differential diagnosis includes, but is not limited to, electrolyte abnormality, DKA, HHS, UTI, pyelonephritis, stone, medication noncompliance  Patient presenting to the ER for evaluation of symptoms as described above.  Based on symptoms, risk factors and considered above differential, this presenting complaint could reflect a potentially life-threatening illness  therefore the patient will be placed on continuous pulse oximetry and telemetry for monitoring.  Laboratory evaluation will be sent to evaluate for the above complaints.  Patient nontoxic-appearing.  Does not meet criteria for DKA or HHS.  Not septic.  Will check urine.   Clinical Course as of 07/05/22 2207  Sat Jul 05, 2022  [address] [facility]: 26.8 [PR]  2138 Urine is positive for UTI.  Is complaining of flank pain will order CT to rule out stone or other cause of pain.  Will give Keflex. [PR]    Clinical Course User Index [PR] Willy Eddy, MD   The imaging on my review and interpretation does not show any evidence of stone.  Patient does appear stable and appropriate for outpatient follow-up.  FINAL CLINICAL IMPRESSION(S) / ED DIAGNOSES   Final diagnoses:  Hyperglycemia  Acute cystitis with hematuria     Rx / DC Orders   ED Discharge Orders          Ordered    cephALEXin (KEFLEX) 500 MG capsule  2 times daily        07/05/22 2207             Note:  This document was prepared using Dragon voice recognition software and may include unintentional dictation errors.    Willy Eddy, MD 07/05/22 2207

## 2022-07-22 DIAGNOSIS — Z9889 Other specified postprocedural states: Secondary | ICD-10-CM | POA: Diagnosis not present

## 2022-07-22 DIAGNOSIS — Z87440 Personal history of urinary (tract) infections: Secondary | ICD-10-CM | POA: Diagnosis not present

## 2022-07-22 DIAGNOSIS — E113299 Type 2 diabetes mellitus with mild nonproliferative diabetic retinopathy without macular edema, unspecified eye: Secondary | ICD-10-CM | POA: Diagnosis not present

## 2022-07-22 DIAGNOSIS — E1140 Type 2 diabetes mellitus with diabetic neuropathy, unspecified: Secondary | ICD-10-CM | POA: Diagnosis not present

## 2022-07-22 DIAGNOSIS — N390 Urinary tract infection, site not specified: Secondary | ICD-10-CM | POA: Diagnosis not present

## 2022-07-22 DIAGNOSIS — K219 Gastro-esophageal reflux disease without esophagitis: Secondary | ICD-10-CM | POA: Diagnosis not present

## 2022-07-22 DIAGNOSIS — E1165 Type 2 diabetes mellitus with hyperglycemia: Secondary | ICD-10-CM | POA: Diagnosis not present

## 2022-07-22 DIAGNOSIS — I10 Essential (primary) hypertension: Secondary | ICD-10-CM | POA: Diagnosis not present

## 2022-07-22 DIAGNOSIS — Z23 Encounter for immunization: Secondary | ICD-10-CM | POA: Diagnosis not present

## 2022-07-22 DIAGNOSIS — E538 Deficiency of other specified B group vitamins: Secondary | ICD-10-CM | POA: Diagnosis not present

## 2022-07-22 DIAGNOSIS — R1011 Right upper quadrant pain: Secondary | ICD-10-CM | POA: Diagnosis not present

## 2022-09-25 DIAGNOSIS — R2689 Other abnormalities of gait and mobility: Secondary | ICD-10-CM | POA: Insufficient documentation

## 2022-10-17 NOTE — Patient Instructions (Incomplete)
____________________________________________________________________________________________  Opioid Pain Medication Update  To: All patients taking opioid pain medications. (I.e.: hydrocodone, hydromorphone, oxycodone, oxymorphone, morphine, codeine, methadone, tapentadol, tramadol, buprenorphine, fentanyl, etc.)  Re: Updated review of side effects and adverse reactions of opioid analgesics, as well as new information about long term effects of this class of medications.  Direct risks of long-term opioid therapy are not limited to opioid addiction and overdose. Potential medical risks include serious fractures, breathing problems during sleep, hyperalgesia, immunosuppression, chronic constipation, bowel obstruction, myocardial infarction, and tooth decay secondary to xerostomia.  Unpredictable adverse effects that can occur even if you take your medication correctly: Cognitive impairment, respiratory depression, and death. Most people think that if they take their medication "correctly", and "as instructed", that they will be safe. Nothing could be farther from the truth. In reality, a significant amount of recorded deaths associated with the use of opioids has occurred in individuals that had taken the medication for a long time, and were taking their medication correctly. The following are examples of how this can happen: Patient taking his/her medication for a long time, as instructed, without any side effects, is given a certain antibiotic or another unrelated medication, which in turn triggers a "Drug-to-drug interaction" leading to disorientation, cognitive impairment, impaired reflexes, respiratory depression or an untoward event leading to serious bodily harm or injury, including death.  Patient taking his/her medication for a long time, as instructed, without any side effects, develops an acute impairment of liver and/or kidney function. This will lead to a rapid inability of the body to  breakdown and eliminate their pain medication, which will result in effects similar to an "overdose", but with the same medicine and dose that they had always taken. This again may lead to disorientation, cognitive impairment, impaired reflexes, respiratory depression or an untoward event leading to serious bodily harm or injury, including death.  A similar problem will occur with patients as they grow older and their liver and kidney function begins to decrease as part of the aging process.  Background information: Historically, the original case for using long-term opioid therapy to treat chronic noncancer pain was based on safety assumptions that subsequent experience has called into question. In 1996, the American Pain Society and the American Academy of Pain Medicine issued a consensus statement supporting long-term opioid therapy. This statement acknowledged the dangers of opioid prescribing but concluded that the risk for addiction was low; respiratory depression induced by opioids was short-lived, occurred mainly in opioid-naive patients, and was antagonized by pain; tolerance was not a common problem; and efforts to control diversion should not constrain opioid prescribing. This has now proven to be wrong. Experience regarding the risks for opioid addiction, misuse, and overdose in community practice has failed to support these assumptions.  According to the Centers for Disease Control and Prevention, fatal overdoses involving opioid analgesics have increased sharply over the past decade. Currently, more than 96,700 people die from drug overdoses every year. Opioids are a factor in 7 out of every 10 overdose deaths. Deaths from drug overdose have surpassed motor vehicle accidents as the leading cause of death for individuals between the ages of 80 and 61.  Clinical data suggest that neuroendocrine dysfunction may be very common in both men and women, potentially causing hypogonadism, erectile  dysfunction, infertility, decreased libido, osteoporosis, and depression. Recent studies linked higher opioid dose to increased opioid-related mortality. Controlled observational studies reported that long-term opioid therapy may be associated with increased risk for cardiovascular events. Subsequent meta-analysis concluded  that the safety of long-term opioid therapy in elderly patients has not been proven.   Side Effects and adverse reactions: Common side effects: Drowsiness (sedation). Dizziness. Nausea and vomiting. Constipation. Physical dependence -- Dependence often manifests with withdrawal symptoms when opioids are discontinued or decreased. Tolerance -- As you take repeated doses of opioids, you require increased medication to experience the same effect of pain relief. Respiratory depression -- This can occur in healthy people, especially with higher doses. However, people with COPD, asthma or other lung conditions may be even more susceptible to fatal respiratory impairment.  Uncommon side effects: An increased sensitivity to feeling pain and extreme response to pain (hyperalgesia). Chronic use of opioids can lead to this. Delayed gastric emptying (the process by which the contents of your stomach are moved into your small intestine). Muscle rigidity. Immune system and hormonal dysfunction. Quick, involuntary muscle jerks (myoclonus). Arrhythmia. Itchy skin (pruritus). Dry mouth (xerostomia).  Long-term side effects: Chronic constipation. Sleep-disordered breathing (SDB). Increased risk of bone fractures. Hypothalamic-pituitary-adrenal dysregulation. Increased risk of overdose.  RISKS: Respiratory depression and death: Opioids increase the risk of respiratory depression and death.  Drug-to-drug interactions: Opioids are relatively contraindicated in combination with benzodiazepines, sleep inducers, and other central nervous system depressants. Other classes of medications  (i.e.: certain antibiotics and even over-the-counter medications) may also trigger or induce respiratory depression in some patients.  Medical conditions: Patients with pre-existing respiratory problems are at higher risk of respiratory failure and/or depression when in combination with opioid analgesics. Opioids are relatively contraindicated in some medical conditions such as central sleep apnea.   Fractures and Falls:  Opioids increase the risk and incidence of falls. This is of particular importance in elderly patients.  Endocrine System:  Long-term administration is associated with endocrine abnormalities (endocrinopathies). (Also known as Opioid-induced Endocrinopathy) Influences on both the hypothalamic-pituitary-adrenal axis?and the hypothalamic-pituitary-gonadal axis have been demonstrated with consequent hypogonadism and adrenal insufficiency in both sexes. Hypogonadism and decreased levels of dehydroepiandrosterone sulfate have been reported in men and women. Endocrine effects include: Amenorrhoea in women (abnormal absence of menstruation) Reduced libido in both sexes Decreased sexual function Erectile dysfunction in men Hypogonadisms (decreased testicular function with shrinkage of testicles) Infertility Depression and fatigue Loss of muscle mass Anxiety Depression Immune suppression Hyperalgesia Weight gain Anemia Osteoporosis Patients (particularly women of childbearing age) should avoid opioids. There is insufficient evidence to recommend routine monitoring of asymptomatic patients taking opioids in the long-term for hormonal deficiencies.  Immune System: Human studies have demonstrated that opioids have an immunomodulating effect. These effects are mediated via opioid receptors both on immune effector cells and in the central nervous system. Opioids have been demonstrated to have adverse effects on antimicrobial response and anti-tumour surveillance. Buprenorphine has  been demonstrated to have no impact on immune function.  Opioid Induced Hyperalgesia: Human studies have demonstrated that prolonged use of opioids can lead to a state of abnormal pain sensitivity, sometimes called opioid induced hyperalgesia (OIH). Opioid induced hyperalgesia is not usually seen in the absence of tolerance to opioid analgesia. Clinically, hyperalgesia may be diagnosed if the patient on long-term opioid therapy presents with increased pain. This might be qualitatively and anatomically distinct from pain related to disease progression or to breakthrough pain resulting from development of opioid tolerance. Pain associated with hyperalgesia tends to be more diffuse than the pre-existing pain and less defined in quality. Management of opioid induced hyperalgesia requires opioid dose reduction.  Cancer: Chronic opioid therapy has been associated with an increased risk of cancer  among noncancer patients with chronic pain. This association was more evident in chronic strong opioid users. Chronic opioid consumption causes significant pathological changes in the small intestine and colon. Epidemiological studies have found that there is a link between opium dependence and initiation of gastrointestinal cancers. Cancer is the second leading cause of death after cardiovascular disease. Chronic use of opioids can cause multiple conditions such as GERD, immunosuppression and renal damage as well as carcinogenic effects, which are associated with the incidence of cancers.   Mortality: Long-term opioid use has been associated with increased mortality among patients with chronic non-cancer pain (CNCP).  Prescription of long-acting opioids for chronic noncancer pain was associated with a significantly increased risk of all-cause mortality, including deaths from causes other than overdose.  Reference: Von Korff M, Kolodny A, Deyo RA, Chou R. Long-term opioid therapy reconsidered. Ann Intern Med. 2011  Sep 6;155(5):325-8. doi: 10.7326/[id number]. PMID: 64403474; PMCID: [id number]. Randon Goldsmith, Hayward RA, Dunn KM, [place] KP. Risk of adverse events in patients prescribed long-term opioids: A cohort study in the [place] [facility] Research Datalink. Eur J Pain. 2019 May;23(5):908-922. doi: 10.1002/ejp.1357. Epub 2019 Jan 31. PMID: 43329518. Colameco S, Coren JS, Ciervo CA. Continuous opioid treatment for chronic noncancer pain: a time for moderation in prescribing. Postgrad Med. 2009 Jul;121(4):61-6. doi: 10.3810/pgm.2009.07.2032. PMID: 84166063. William Hamburger RN, [place] SD, Blazina I, Cristopher Peru, Bougatsos C, Deyo RA. The effectiveness and risks of long-term opioid therapy for chronic pain: a systematic review for a [employer] of [employer] Pathways to [employer]. Ann Intern Med. 2015 Feb 17;162(4):276-86. doi: 10.7326/M14-2559. PMID: 01601093. Caryl Bis [facility], Makuc DM. NCHS Data Brief No. 22. [place]: Centers for Disease Control and Prevention; 2009. Sep, Increase in Fatal Poisonings Involving Opioid Analgesics in the [place], 1999-2006. Song IA, Choi HR, Oh TK. Long-term opioid use and mortality in patients with chronic non-cancer pain: Ten-year follow-up study in [place] from 2010 through 2019. EClinicalMedicine. 2022 Jul 18;51:101558. doi: 10.1016/j.eclinm.2022.235573. PMID: 22025427; PMCID: [id number]. Huser, W., Schubert, T., Vogelmann, T. et al. All-cause mortality in patients with long-term opioid therapy compared with non-opioid analgesics for chronic non-cancer pain: a database study. BMC Med 18, 162 (2020). http://lester.info/ Rashidian H, Karie Kirks, Malekzadeh R, Haghdoost AA. An Ecological Study of the Association between Opiate Use and Incidence of Cancers. Addict Health. 2016 Fall;8(4):252-260. PMID: 51761607; PMCID: [id number].  Our Goal: Our goal is to control your  pain with means other than the use of opioid pain medications.  Our Recommendation: Talk to your physician about coming off of these medications. We can assist you with the tapering down and stopping these medicines. Based on the new information, even if you cannot completely stop the medication, a decrease in the dose may be associated with a lesser risk. Ask for other means of controlling the pain. Decrease or eliminate those factors that significantly contribute to your pain such as smoking, obesity, and a diet heavily tilted towards "inflammatory" nutrients.  Last Updated: 07/28/2022   ____________________________________________________________________________________________     ____________________________________________________________________________________________  National Pain Medication Shortage  The [place] is experiencing worsening drug shortages. These have had a negative widespread effect on patient care and treatment. Not expected to improve any time soon. Predicted to last past 2029.   Drug shortage list (generic names) Oxycodone IR Oxycodone/APAP Oxymorphone IR Hydromorphone Hydrocodone/APAP Morphine  Where is the problem?  Manufacturing and supply level.  Will this shortage affect you?  Only if you  take any of the above pain medications.  How? You may be unable to fill your prescription.  Your pharmacist may offer a "partial fill" of your prescription. (Warning: Do not accept partial fills.) Prescriptions partially filled cannot be transferred to another pharmacy. Read our Medication Rules and Regulation. Depending on how much medicine you are dependent on, you may experience withdrawals when unable to get the medication.  Recommendations: Consider ending your dependence on opioid pain medications. Ask your pain specialist to assist you with the process. Consider switching to a medication currently not in shortage, such as Buprenorphine. Talk to your pain  specialist about this option. Consider decreasing your pain medication requirements by managing tolerance thru "Drug Holidays". This may help minimize withdrawals, should you run out of medicine. Control your pain thru the use of non-pharmacological interventional therapies.   Your prescriber: Prescribers cannot be blamed for shortages. Medication manufacturing and supply issues cannot be fixed by the prescriber.   NOTE: The prescriber is not responsible for supplying the medication, or solving supply issues. Work with your pharmacist to solve it. The patient is responsible for the decision to take or continue taking the medication and for identifying and securing a legal supply source. By law, supplying the medication is the job and responsibility of the pharmacy. The prescriber is responsible for the evaluation, monitoring, and prescribing of these medications.   Prescribers will NOT: Re-issue prescriptions that have been partially filled. Re-issue prescriptions already sent to a pharmacy.  Re-send prescriptions to a different pharmacy because yours did not have your medication. Ask pharmacist to order more medicine or transfer the prescription to another pharmacy. (Read below.)  New 2023 regulation: "September 20, 2021 Revised Regulation Allows DEA-Registered Pharmacies to Transfer Electronic Prescriptions at a Patient's Request DEA Headquarters Division - Public Information Office Patients now have the ability to request their electronic prescription be transferred to another pharmacy without having to go back to their practitioner to initiate the request. This revised regulation went into effect on Monday, September 16, 2021.     At a patient's request, a DEA-registered retail pharmacy can now transfer an electronic prescription for a controlled substance (schedules II-V) to another DEA-registered retail pharmacy. Prior to this change, patients would have to go through their practitioner to  cancel their prescription and have it re-issued to a different pharmacy. The process was taxing and time consuming for both patients and practitioners.    The Drug Enforcement Administration [facility]) published its intent to revise the process for transferring electronic prescriptions on December 09, 2019.  The final rule was published in the federal register on August 15, 2021 and went into effect 30 days later.  Under the final rule, a prescription can only be transferred once between pharmacies, and only if allowed under existing state or other applicable law. The prescription must remain in its electronic form; may not be altered in any way; and the transfer must be communicated directly between two licensed pharmacists. It's important to note, any authorized refills transfer with the original prescription, which means the entire prescription will be filled at the same pharmacy".  Reference: HugeHand.is [facility] website announcement)  [email or web address] [employer] of [employer])   [employer] / Vol. 88, No. 143 / Thursday, August 15, 2021 / Rules and Regulations DEPARTMENT OF JUSTICE  Drug Enforcement Administration  21 CFR Part 1306  [Docket No. [id number]]  RIN [id number] Transfer of Electronic Prescriptions for Schedules II-V Controlled Substances Between Pharmacies for Initial Filling  ____________________________________________________________________________________________  ____________________________________________________________________________________________  Transfer of Pain Medication between Pharmacies  Re: 2023 DEA Clarification on existing regulation  Published on DEA Website: September 20, 2021  Title: Revised Regulation Allows DEA-Registered Pharmacies to Electrical engineer Prescriptions at a Patient's  Request DEA Headquarters Division - [employer]  "Patients now have the ability to request their electronic prescription be transferred to another pharmacy without having to go back to their practitioner to initiate the request. This revised regulation went into effect on Monday, September 16, 2021.     At a patient's request, a DEA-registered retail pharmacy can now transfer an electronic prescription for a controlled substance (schedules II-V) to another DEA-registered retail pharmacy. Prior to this change, patients would have to go through their practitioner to cancel their prescription and have it re-issued to a different pharmacy. The process was taxing and time consuming for both patients and practitioners.    The Drug Enforcement Administration [facility]) published its intent to revise the process for transferring electronic prescriptions on December 09, 2019.  The final rule was published in the federal register on August 15, 2021 and went into effect 30 days later.  Under the final rule, a prescription can only be transferred once between pharmacies, and only if allowed under existing state or other applicable law. The prescription must remain in its electronic form; may not be altered in any way; and the transfer must be communicated directly between two licensed pharmacists. It's important to note, any authorized refills transfer with the original prescription, which means the entire prescription will be filled at the same pharmacy."    REFERENCES: 1. DEA website announcement [email or web address]  2. Department of Justice website  [email or web address].pdf  3. DEPARTMENT OF JUSTICE Drug Enforcement Administration 21 CFR Part 1306 [Docket No. [id number]] RIN [id number] "Transfer of Electronic Prescriptions for Schedules II-V Controlled Substances  Between Pharmacies for Initial Filling"  ____________________________________________________________________________________________     _______________________________________________________________________  Medication Rules  Purpose: To inform patients, and their family members, of our medication rules and regulations.  Applies to: All patients receiving prescriptions from our practice (written or electronic).  Pharmacy of record: This is the pharmacy where your electronic prescriptions will be sent. Make sure we have the correct one.  Electronic prescriptions: In compliance with the [facility] Strengthen Opioid Misuse Prevention (STOP) Act of 2017 (Session Conni Elliot [phone]), effective January 20, 2018, all controlled substances must be electronically prescribed. Written prescriptions, faxing, or calling prescriptions to a pharmacy will no longer be done.  Prescription refills: These will be provided only during in-person appointments. No medications will be renewed without a "face-to-face" evaluation with your provider. Applies to all prescriptions.  NOTE: The following applies primarily to controlled substances (Opioid* Pain Medications).   Type of encounter (visit): For patients receiving controlled substances, face-to-face visits are required. (Not an option and not up to the patient.)  Patient's responsibilities: Pain Pills: Bring all pain pills to every appointment (except for procedure appointments). Pill Bottles: Bring pills in original pharmacy bottle. Bring bottle, even if empty. Always bring the bottle of the most recent fill.  Medication refills: You are responsible for knowing and keeping track of what medications you are taking and when is it that you will need a refill. The day before your appointment: write a list of all prescriptions that need to be refilled. The day of the appointment: give the list to the admitting nurse. Prescriptions will be written only  during appointments. No prescriptions will be written on procedure days. If you forget a  medication: it will not be "Called in", "Faxed", or "electronically sent". You will need to get another appointment to get these prescribed. No early refills. Do not call asking to have your prescription filled early. Partial  or short prescriptions: Occasionally your pharmacy may not have enough pills to fill your prescription.  NEVER ACCEPT a partial fill or a prescription that is short of the total amount of pills that you were prescribed.  With controlled substances the law allows 72 hours for the pharmacy to complete the prescription.  If the prescription is not completed within 72 hours, the pharmacist will require a new prescription to be written. This means that you will be short on your medicine and we WILL NOT send another prescription to complete your original prescription.  Instead, request the pharmacy to send a carrier to a nearby branch to get enough medication to provide you with your full prescription. Prescription Accuracy: You are responsible for carefully inspecting your prescriptions before leaving our office. Have the discharge nurse carefully go over each prescription with you, before taking them home. Make sure that your name is accurately spelled, that your address is correct. Check the name and dose of your medication to make sure it is accurate. Check the number of pills, and the written instructions to make sure they are clear and accurate. Make sure that you are given enough medication to last until your next medication refill appointment. Taking Medication: Take medication as prescribed. When it comes to controlled substances, taking less pills or less frequently than prescribed is permitted and encouraged. Never take more pills than instructed. Never take the medication more frequently than prescribed.  Inform other Doctors: Always inform, all of your healthcare providers, of all the  medications you take. Pain Medication from other Providers: You are not allowed to accept any additional pain medication from any other Doctor or Healthcare provider. There are two exceptions to this rule. (see below) In the event that you require additional pain medication, you are responsible for notifying us, as stated below. Cough Medicine: Often these contain an opioid, such as codeine or hydrocodone. Never accept or take cough medicine containing these opioids if you are already taking an opioid* medication. The combination may cause respiratory failure and death. Medication Agreement: You are responsible for carefully reading and following our Medication Agreement. This must be signed before receiving any prescriptions from our practice. Safely store a copy of your signed Agreement. Violations to the Agreement will result in no further prescriptions. (Additional copies of our Medication Agreement are available upon request.) Laws, Rules, & Regulations: All patients are expected to follow all [address] and [employer], Rules, [employer]. Ignorance of the Laws does not constitute a valid excuse.  Illegal drugs and Controlled Substances: The use of illegal substances (including, but not limited to marijuana and its derivatives) and/or the illegal use of any controlled substances is strictly prohibited. Violation of this rule may result in the immediate and permanent discontinuation of any and all prescriptions being written by our practice. The use of any illegal substances is prohibited. Adopted CDC guidelines & recommendations: Target dosing levels will be at or below 60 MME/day. Use of benzodiazepines** is not recommended.  Exceptions: There are only two exceptions to the rule of not receiving pain medications from other Healthcare Providers. Exception #1 (Emergencies): In the event of an emergency (i.e.: accident requiring emergency care), you are allowed to receive additional pain  medication. However, you are responsible for: As soon as  you are able, call our office [phone], at any time of the day or night, and leave a message stating your name, the date and nature of the emergency, and the name and dose of the medication prescribed. In the event that your call is answered by a member of our staff, make sure to document and save the date, time, and the name of the person that took your information.  Exception #2 (Planned Surgery): In the event that you are scheduled by another doctor or dentist to have any type of surgery or procedure, you are allowed (for a period no longer than 30 days), to receive additional pain medication, for the acute post-op pain. However, in this case, you are responsible for picking up a copy of our "Post-op Pain Management for Surgeons" handout, and giving it to your surgeon or dentist. This document is available at our office, and does not require an appointment to obtain it. Simply go to our office during business hours (Monday-Thursday from 8:00 AM to 4:00 PM) (Friday 8:00 AM to 12:00 Noon) or if you have a scheduled appointment with [place], prior to your surgery, and ask for it by name. In addition, you are responsible for: calling our office [phone], at any time of the day or night, and leaving a message stating your name, name of your surgeon, type of surgery, and date of procedure or surgery. Failure to comply with your responsibilities may result in termination of therapy involving the controlled substances. Medication Agreement Violation. Following the above rules, including your responsibilities will help you in avoiding a Medication Agreement Violation ("Breaking your Pain Medication Contract").  Consequences:  Not following the above rules may result in permanent discontinuation of medication prescription therapy.  *Opioid medications include: morphine, codeine, oxycodone, oxymorphone, hydrocodone, hydromorphone, meperidine, tramadol,  tapentadol, buprenorphine, fentanyl, methadone. **Benzodiazepine medications include: diazepam (Valium), alprazolam (Xanax), clonazepam (Klonopine), lorazepam (Ativan), clorazepate (Tranxene), chlordiazepoxide (Librium), estazolam (Prosom), oxazepam (Serax), temazepam (Restoril), triazolam (Halcion) (Last updated: 11/12/2021) ______________________________________________________________________    ______________________________________________________________________  Medication Recommendations and Reminders  Applies to: All patients receiving prescriptions (written and/or electronic).  Medication Rules & Regulations: You are responsible for reading, knowing, and following our "Medication Rules" document. These exist for your safety and that of others. They are not flexible and neither are we. Dismissing or ignoring them is an act of "non-compliance" that may result in complete and irreversible termination of such medication therapy. For safety reasons, "non-compliance" will not be tolerated. As with the [place] fundamental legal principle of "ignorance of the law is no defense", we will accept no excuses for not having read and knowing the content of documents provided to you by our practice.  Pharmacy of record:  Definition: This is the pharmacy where your electronic prescriptions will be sent.  We do not endorse any particular pharmacy. It is up to you and your insurance to decide what pharmacy to use.  We do not restrict you in your choice of pharmacy. However, once we write for your prescriptions, we will NOT be re-sending more prescriptions to fix restricted supply problems created by your pharmacy, or your insurance.  The pharmacy listed in the electronic medical record should be the one where you want electronic prescriptions to be sent. If you choose to change pharmacy, simply notify our nursing staff. Changes will be made only during your regular appointments and not over the  phone.  Recommendations: Keep all of your pain medications in a safe place, under lock and key, even  if you live alone. We will NOT replace lost, stolen, or damaged medication. We do not accept "Police Reports" as proof of medications having been stolen. After you fill your prescription, take 1 week's worth of pills and put them away in a safe place. You should keep a separate, properly labeled bottle for this purpose. The remainder should be kept in the original bottle. Use this as your primary supply, until it runs out. Once it's gone, then you know that you have 1 week's worth of medicine, and it is time to come in for a prescription refill. If you do this correctly, it is unlikely that you will ever run out of medicine. To make sure that the above recommendation works, it is very important that you make sure your medication refill appointments are scheduled at least 1 week before you run out of medicine. To do this in an effective manner, make sure that you do not leave the office without scheduling your next medication management appointment. Always ask the nursing staff to show you in your prescription , when your medication will be running out. Then arrange for the receptionist to get you a return appointment, at least 7 days before you run out of medicine. Do not wait until you have 1 or 2 pills left, to come in. This is very poor planning and does not take into consideration that we may need to cancel appointments due to bad weather, sickness, or emergencies affecting our staff. DO NOT ACCEPT A "Partial Fill": If for any reason your pharmacy does not have enough pills/tablets to completely fill or refill your prescription, do not allow for a "partial fill". The law allows the pharmacy to complete that prescription within 72 hours, without requiring a new prescription. If they do not fill the rest of your prescription within those 72 hours, you will need a separate prescription to fill the remaining  amount, which we will NOT provide. If the reason for the partial fill is your insurance, you will need to talk to the pharmacist about payment alternatives for the remaining tablets, but again, DO NOT ACCEPT A PARTIAL FILL, unless you can trust your pharmacist to obtain the remainder of the pills within 72 hours.  Prescription refills and/or changes in medication(s):  Prescription refills, and/or changes in dose or medication, will be conducted only during scheduled medication management appointments. (Applies to both, written and electronic prescriptions.) No refills on procedure days. No medication will be changed or started on procedure days. No changes, adjustments, and/or refills will be conducted on a procedure day. Doing so will interfere with the diagnostic portion of the procedure. No phone refills. No medications will be "called into the pharmacy". No Fax refills. No weekend refills. No Holliday refills. No after hours refills.  Remember:  Business hours are:  Monday to Thursday 8:00 AM to 4:00 PM Provider's Schedule: Delano Metz, MD - Appointments are:  Medication management: Monday and Wednesday 8:00 AM to 4:00 PM Procedure day: Tuesday and Thursday 7:30 AM to 4:00 PM Edward Jolly, MD - Appointments are:  Medication management: Tuesday and Thursday 8:00 AM to 4:00 PM Procedure day: Monday and Wednesday 7:30 AM to 4:00 PM (Last update: 11/12/2021) ______________________________________________________________________   ____________________________________________________________________________________________  Naloxone Nasal Spray  Why am I receiving this medication? [place] STOP ACT requires that all patients taking high dose opioids or at risk of opioids respiratory depression, be prescribed an opioid reversal agent, such as Naloxone (AKA: Narcan).  What is this medication? NALOXONE (  nal OX one) treats opioid overdose, which causes slow or shallow breathing,  severe drowsiness, or trouble staying awake. Call emergency services after using this medication. You may need additional treatment. Naloxone works by reversing the effects of opioids. It belongs to a group of medications called opioid blockers.  COMMON BRAND NAME(S): Kloxxado, Narcan  What should I tell my care team before I take this medication? They need to know if you have any of these conditions: Heart disease Substance use disorder An unusual or allergic reaction to naloxone, other medications, foods, dyes, or preservatives Pregnant or trying to get pregnant Breast-feeding  When to use this medication? This medication is to be used for the treatment of respiratory depression (less than 8 breaths per minute) secondary to opioid overdose.   How to use this medication? This medication is for use in the nose. Lay the person on their back. Support their neck with your hand and allow the head to tilt back before giving the medication. The nasal spray should be given into 1 nostril. After giving the medication, move the person onto their side. Do not remove or test the nasal spray until ready to use. Get emergency medical help right away after giving the first dose of this medication, even if the person wakes up. You should be familiar with how to recognize the signs and symptoms of a narcotic overdose. If more doses are needed, give the additional dose in the other nostril. Talk to your care team about the use of this medication in children. While this medication may be prescribed for children as young as newborns for selected conditions, precautions do apply.  Naloxone Overdosage: If you think you have taken too much of this medicine contact a poison control center or emergency room at once.  NOTE: This medicine is only for you. Do not share this medicine with others.  What if I miss a dose? This does not apply.  What may interact with this medication? This is only used during an  emergency. No interactions are expected during emergency use. This list may not describe all possible interactions. Give your health care provider a list of all the medicines, herbs, non-prescription drugs, or dietary supplements you use. Also tell them if you smoke, drink alcohol, or use illegal drugs. Some items may interact with your medicine.  What should I watch for while using this medication? Keep this medication ready for use in the case of an opioid overdose. Make sure that you have the phone number of your care team and local hospital ready. You may need to have additional doses of this medication. Each nasal spray contains a single dose. Some emergencies may require additional doses. After use, bring the treated person to the nearest hospital or call 911. Make sure the treating care team knows that the person has received a dose of this medication. You will receive additional instructions on what to do during and after use of this medication before an emergency occurs.  What side effects may I notice from receiving this medication? Side effects that you should report to your care team as soon as possible: Allergic reactions--skin rash, itching, hives, swelling of the face, lips, tongue, or throat Side effects that usually do not require medical attention (report these to your care team if they continue or are bothersome): Constipation Dryness or irritation inside the nose Headache Increase in blood pressure Muscle spasms Stuffy nose Toothache This list may not describe all possible side effects. Call your doctor for  medical advice about side effects. You may report side effects to FDA at 1-800-FDA-1088.  Where should I keep my medication? Because this is an emergency medication, you should keep it with you at all times.  Keep out of the reach of children and pets. Store between 20 and 25 degrees C (68 and 77 degrees F). Do not freeze. Throw away any unused medication after the  expiration date. Keep in original box until ready to use.  NOTE: This sheet is a summary. It may not cover all possible information. If you have questions about this medicine, talk to your doctor, pharmacist, or health care provider.   2023 [employer]vier/Gold Standard (2020-09-14 00:00:00)  ____________________________________________________________________________________________

## 2022-10-17 NOTE — Progress Notes (Unsigned)
PROVIDER NOTE: Information contained herein reflects review and annotations entered in association with encounter. Interpretation of such information and data should be left to medically-trained personnel. Information provided to patient can be located elsewhere in the medical record under "Patient Instructions". Document created using STT-dictation technology, any transcriptional errors that may result from process are unintentional.    Patient: Charlene [name]  Service Category: E/M  Provider: Oswaldo Done, MD  DOB: February 20, 1960  DOS: 10/22/2022  Referring Provider: Barbette Reichmann, MD  MRN: [id number]  Specialty: Interventional Pain Management  PCP: Barbette Reichmann, MD  Type: Established Patient  Setting: Ambulatory outpatient    Location: Office  Delivery: Face-to-face     HPI  [name], a 62 y.o. year old female, is here today because of her No primary diagnosis found.. Ms. [place] primary complain today is No chief complaint on file.  Pertinent problems: Ms. Dusseault has Burning sensation of feet; Neuropathic pain; Neuropathy; Lower extremity numbness and tingling (Left); Polyneuropathy associated with underlying disease (HCC); Chronic ankle pain (Left); Chronic foot pain (Left); Chronic lower extremity pain (2ry area of Pain) (Bilateral) (L>R); Chronic knee pain (4th area of Pain) (Bilateral) (L>R); Chronic low back pain (3ry area of Pain) (Bilateral) (L>R) w/ sciatica (Left); Chronic pain syndrome; DM type 2 with diabetic peripheral neuropathy (HCC); DDD (degenerative disc disease), lumbar; Lumbar facet arthropathy; Lumbar facet syndrome; Lumbar facet hypertrophy; Lumbar foraminal stenosis (L5-S1) (Right); Osteoarthritis of knee (Bilateral); Chronic musculoskeletal pain; Abnormal EMG (07/06/2017); Osteoarthritis involving multiple joints; 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); Knee pain; Back pain with left-sided sciatica; Meningioma (HCC); Neck pain;  Chronic pain disorder; Left leg weakness; Meralgia paraesthetica, left; Pain in both lower extremities; Arthropathy of lumbar facet joint; Chronic ankle and foot pain (1ry area of Pain) (Left); 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 Pain Assessment: Severity of   is reported as a  /10. Location:    / . Onset:  . Quality:  . Timing:  . Modifying factor(s):  Marland Kitchen Vitals:  vitals were not taken for this visit.  BMI: Estimated body mass index is 28.94 kg/m as calculated from the following:   Height as of 07/05/22: 4\' 11"  (1.499 m).   Weight as of 07/05/22: 143 lb 4.8 oz (65 kg). Last encounter: 05/29/2022. Last procedure: 05/13/2022.  Reason for encounter: medication management. ***  RTCB: 04/23/2023   Pharmacotherapy Assessment  Analgesic: Tramadol 50 mg, 1 tab PO QD. ABNORMAL UDS (09/03/2020) (+) for COCAINE. MME: 5 mg/day   Monitoring: [place] PMP: PDMP reviewed during this encounter.       Pharmacotherapy: No side-effects or adverse reactions reported. Compliance: No problems identified. Effectiveness: Clinically acceptable.  No notes on file  No results found for: "CBDTHCR" No results found for: "D8THCCBX" No results found for: "D9THCCBX"  UDS:  Summary  Date Value Ref Range Status  02/26/2022 Note  Final    Comment:    ==================================================================== [employer] [facility] 13  (MW) ==================================================================== Test  Result       Flag       Units  Drug Present and Declared for Prescription Verification   Tramadol                       4758         EXPECTED   ng/mg creat   O-Desmethyltramadol            >6173        EXPECTED   ng/mg creat   N-Desmethyltramadol            1183         EXPECTED   ng/mg creat    Source of tramadol is a prescription medication. O-desmethyltramadol    and N-desmethyltramadol are expected metabolites of tramadol.  ==================================================================== Test                      Result    Flag   Units      Ref Range   Creatinine              81               mg/dL      >=16 ==================================================================== Declared Medications:  The flagging and interpretation on this report are based on the  following declared medications.  Unexpected results may arise from  inaccuracies in the declared medications.   **Note: The testing scope of this panel includes these medications:   Tramadol (Ultram)   **Note: The testing scope of this panel does not include the  following reported medications:   Amlodipine (Norvasc)  Baclofen (Lioresal)  Carvedilol (Coreg)  Cholecalciferol  Dicyclomine (Bentyl)  Duloxetine (Cymbalta)  Hydralazine (Apresoline)  Insulin (Toujeo)  Lisinopril (Zestril)  Magnesium  Metformin (Glucophage)  Nortriptyline (Pamelor)  Pantoprazole (Protonix)  Polyethylene Glycol (MiraLAX)  Pravastatin (Pravachol)  Pregabalin (Lyrica)  Sucralfate (Carafate)  Topical Lidocaine (Lidoderm) ==================================================================== For clinical consultation, please call [phone]. ====================================================================       ROS  Constitutional: Denies any fever or chills Gastrointestinal: No reported hemesis, hematochezia,  vomiting, or acute GI distress Musculoskeletal: Denies any acute onset joint swelling, redness, loss of ROM, or weakness Neurological: No reported episodes of acute onset apraxia, aphasia, dysarthria, agnosia, amnesia, paralysis, loss of coordination, or loss of consciousness  Medication Review  Cholecalciferol, DULoxetine, FreeStyle Libre Sensor System, Magnesium, amLODipine, baclofen, carvedilol, dicyclomine, hydrALAZINE, insulin glargine (2 Unit Dial), insulin lispro, lidocaine, lisinopril, metFORMIN, naloxone, nortriptyline, pantoprazole, polyethylene glycol, pravastatin, pregabalin, sucralfate, and traMADol  History Review  Allergy: Ms. Merlo is allergic to semaglutide. Drug: Ms. Valois  reports no history of drug use. Alcohol:  reports that she does not currently use alcohol. Tobacco:  reports that she has never smoked. She has never used smokeless tobacco. Social: Ms. Cassey  reports that she has never smoked. She has never used smokeless tobacco. She reports that she does not currently use alcohol. She reports that she does not use drugs. Medical:  has a past medical history of Arthritis, Degenerative disc disease, lumbar, Diabetes mellitus without complication (HCC), GERD (gastroesophageal reflux disease), Hypertension, Neuropathy, Polyneuropathy, and Polyneuropathy. Surgical: Ms. Mcgrann  has a past surgical history that includes arthroscopic rotator cuff; Colonoscopy; Abdominal hysterectomy; endosopic sinus; and Cataract extraction w/PHACO (Right, 11/25/2017). Family: family history includes Alcohol abuse in her father; Breast cancer in her maternal aunt; Cancer in her father.  Laboratory Chemistry Profile   Renal Lab Results  Component Value Date  BUN 24 (H) 07/05/2022   CREATININE 1.58 (H) 07/05/2022   BCR 17 10/12/2017   GFRAA >60 12/23/2018   GFRNONAA 37 (L) 07/05/2022    Hepatic Lab Results  Component Value Date   AST 17 10/21/2018   ALT 21 10/21/2018   ALBUMIN 3.6  10/21/2018   ALKPHOS 81 10/21/2018   LIPASE 20 10/21/2018    Electrolytes Lab Results  Component Value Date   NA 137 07/05/2022   K 3.2 (L) 07/05/2022   CL 103 07/05/2022   CALCIUM 9.0 07/05/2022   MG 2.0 10/12/2017    Bone Lab Results  Component Value Date   25OHVITD1 17 (L) 10/12/2017   25OHVITD2 <1.0 10/12/2017   25OHVITD3 17 10/12/2017    Inflammation (CRP: Acute Phase) (ESR: Chronic Phase) Lab Results  Component Value Date   CRP 4 10/12/2017   ESRSEDRATE 12 10/12/2017         Note: Above Lab results reviewed.  Recent Imaging Review  CT Renal Stone Study CLINICAL DATA:  Abdominal and flank pain, initial encounter  EXAM: CT ABDOMEN AND PELVIS WITHOUT CONTRAST  TECHNIQUE: Multidetector CT imaging of the abdomen and pelvis was performed following the standard protocol without IV contrast.  RADIATION DOSE REDUCTION: This exam was performed according to the departmental dose-optimization program which includes automated exposure control, adjustment of the mA and/or kV according to patient size and/or use of iterative reconstruction technique.  COMPARISON:  03/18/2021  FINDINGS: Lower chest: Mild atelectatic changes are noted bilaterally.  Hepatobiliary: No focal liver abnormality is seen. No gallstones, gallbladder wall thickening, or biliary dilatation.  Pancreas: Unremarkable. No pancreatic ductal dilatation or surrounding inflammatory changes.  Spleen: Normal in size without focal abnormality.  Adrenals/Urinary Tract: Adrenal glands are within normal limits. Kidneys demonstrate a normal appearance bilaterally. No renal calculi or obstructive changes are seen. Bladder is partially distended.  Stomach/Bowel: No obstructive or inflammatory changes of colon are seen. Fecal material is noted throughout the colon without obstructive change. Appendix is within normal limits. Small bowel and stomach are unremarkable.  Vascular/Lymphatic: No significant  vascular findings are present. No enlarged abdominal or pelvic lymph nodes.  Reproductive: Status post hysterectomy. No adnexal masses.  Other: No abdominal wall hernia or abnormality. No abdominopelvic ascites.  Musculoskeletal: Degenerative changes of lumbar spine are noted.  IMPRESSION: No acute abnormality noted.  Electronically Signed   By: Alcide Clever M.D.   On: 07/05/2022 22:00 Note: Reviewed        Physical Exam  General appearance: Well nourished, well developed, and well hydrated. In no apparent acute distress Mental status: Alert, oriented x 3 (person, place, & time)       Respiratory: No evidence of acute respiratory distress Eyes: PERLA Vitals: There were no vitals taken for this visit. BMI: Estimated body mass index is 28.94 kg/m as calculated from the following:   Height as of 07/05/22: 4\' 11"  (1.499 m).   Weight as of 07/05/22: 143 lb 4.8 oz (65 kg). Ideal: Patient weight not recorded  Assessment   Diagnosis Status  No diagnosis found. Controlled Controlled Controlled   Updated Problems: No problems updated.  Plan of Care  Problem-specific:  No problem-specific Assessment & Plan notes found for this encounter.  Ms. MAHRI ECKHOLM has a current medication list which includes the following long-term medication(s): amlodipine, baclofen, carvedilol, cholecalciferol, dicyclomine, duloxetine, hydralazine, toujeo max solostar, insulin lispro, lisinopril, magnesium, nortriptyline, pantoprazole, pravastatin, pregabalin, sucralfate, and tramadol.  Pharmacotherapy (Medications Ordered): No orders of the defined types  were placed in this encounter.  Orders:  No orders of the defined types were placed in this encounter.  Follow-up plan:   No follow-ups on file.      Interventional Therapies  Risk Factors  Considerations:   NOTE: UNCONTROLLED IDDM (NO STEROIDS)  ABNORMAL UDS (09/03/2020) (+) for COCAINE       Planned  Pending:  (NO STEROIDS)   Diagnostic right L4-5 and L5-S1 lumbar facet MBB (no steroids)  Diagnostic left genicular nerve block #1 (no steroids) (to be done after lumbar facet block.) Therapeutic left IA Gelsyn-3 knee inj. #3 + left L3-4 LESI #1 (w/o steroids)    Under consideration:   Possible bilateral lumbar facet RFA  Diagnostic bilateral genicular NB    Completed:   Diagnostic right lumbar facet MBB x3 (no steroid) (03/26/2021) (100/100/50/50)  Diagnostic left lumbar facet MBB x1 (w/ steroid) (10/09/2020) (100/100/90/L-100; R-50)  Therapeutic left L5 TFESI x4 (09/21/2018) (100/100/50/70)  Therapeutic left L4-5 LESI x4 (09/21/2018) (100/100/50/70)  Therapeutic left L5-S1 LESI x2 (02/25/2018) (100/100/100/100)  Therapeutic bilateral Hyalgan knee inj. x5 (02/25/2018) (100/100/100 x2 weeks/<50)  Therapeutic left IA steroid knee injection x1 (11/23/2017) (100/100/0/0)  Therapeutic left IA Gelsyn-3 knee inj. x2    Therapeutic  Palliative (PRN) options:  (NO PRN PROCEDURES W/O MD F2F EVALUATION) Palliative L5 TFESI  Palliative L4-5 LESI   Palliative L5-S1 LESI   Palliative Hyalgan knee inj.        Recent Visits No visits were found meeting these conditions. Showing recent visits within past 90 days and meeting all other requirements Future Appointments Date Type Provider Dept  10/22/22 Appointment Delano Metz, MD [facility]  Showing future appointments within next 90 days and meeting all other requirements  I discussed the assessment and treatment plan with the patient. The patient was provided an opportunity to ask questions and all were answered. The patient agreed with the plan and demonstrated an understanding of the instructions.  Patient advised to call back or seek an in-person evaluation if the symptoms or condition worsens.  Duration of encounter: *** minutes.  Total time on encounter, as per AMA guidelines included both the face-to-face and non-face-to-face time personally spent by  the physician and/or other qualified health care professional(s) on the day of the encounter (includes time in activities that require the physician or other qualified health care professional and does not include time in activities normally performed by clinical staff). Physician's time may include the following activities when performed: Preparing to see the patient (e.g., pre-charting review of records, searching for previously ordered imaging, lab work, and nerve conduction tests) Review of prior analgesic pharmacotherapies. Reviewing PMP Interpreting ordered tests (e.g., lab work, imaging, nerve conduction tests) Performing post-procedure evaluations, including interpretation of diagnostic procedures Obtaining and/or reviewing separately obtained history Performing a medically appropriate examination and/or evaluation Counseling and educating the patient/family/caregiver Ordering medications, tests, or procedures Referring and communicating with other health care professionals (when not separately reported) Documenting clinical information in the electronic or other health record Independently interpreting results (not separately reported) and communicating results to the patient/ family/caregiver Care coordination (not separately reported)  Note by: Oswaldo Done, MD Date: 10/22/2022; Time: 7:38 AM

## 2022-10-20 ENCOUNTER — Encounter: Payer: Medicare Other | Admitting: Pain Medicine

## 2022-10-22 ENCOUNTER — Ambulatory Visit: Payer: Medicare PPO | Attending: Pain Medicine | Admitting: Pain Medicine

## 2022-10-22 ENCOUNTER — Encounter: Payer: Self-pay | Admitting: Pain Medicine

## 2022-10-22 VITALS — BP 122/76 | HR 86 | Temp 97.3°F | Ht 59.0 in | Wt 150.0 lb

## 2022-10-22 DIAGNOSIS — M25562 Pain in left knee: Secondary | ICD-10-CM | POA: Diagnosis present

## 2022-10-22 DIAGNOSIS — G8929 Other chronic pain: Secondary | ICD-10-CM | POA: Diagnosis present

## 2022-10-22 DIAGNOSIS — M4316 Spondylolisthesis, lumbar region: Secondary | ICD-10-CM | POA: Diagnosis present

## 2022-10-22 DIAGNOSIS — E1142 Type 2 diabetes mellitus with diabetic polyneuropathy: Secondary | ICD-10-CM | POA: Diagnosis present

## 2022-10-22 DIAGNOSIS — M25561 Pain in right knee: Secondary | ICD-10-CM | POA: Diagnosis present

## 2022-10-22 DIAGNOSIS — R937 Abnormal findings on diagnostic imaging of other parts of musculoskeletal system: Secondary | ICD-10-CM | POA: Insufficient documentation

## 2022-10-22 DIAGNOSIS — M545 Low back pain, unspecified: Secondary | ICD-10-CM | POA: Diagnosis present

## 2022-10-22 DIAGNOSIS — Z79899 Other long term (current) drug therapy: Secondary | ICD-10-CM | POA: Diagnosis present

## 2022-10-22 DIAGNOSIS — M79604 Pain in right leg: Secondary | ICD-10-CM | POA: Diagnosis present

## 2022-10-22 DIAGNOSIS — M79605 Pain in left leg: Secondary | ICD-10-CM | POA: Diagnosis present

## 2022-10-22 DIAGNOSIS — M25572 Pain in left ankle and joints of left foot: Secondary | ICD-10-CM | POA: Insufficient documentation

## 2022-10-22 DIAGNOSIS — G894 Chronic pain syndrome: Secondary | ICD-10-CM | POA: Diagnosis present

## 2022-10-22 DIAGNOSIS — Z79891 Long term (current) use of opiate analgesic: Secondary | ICD-10-CM | POA: Insufficient documentation

## 2022-10-22 DIAGNOSIS — M5459 Other low back pain: Secondary | ICD-10-CM | POA: Diagnosis present

## 2022-10-22 DIAGNOSIS — M47816 Spondylosis without myelopathy or radiculopathy, lumbar region: Secondary | ICD-10-CM | POA: Diagnosis present

## 2022-10-22 DIAGNOSIS — M47817 Spondylosis without myelopathy or radiculopathy, lumbosacral region: Secondary | ICD-10-CM | POA: Diagnosis present

## 2022-10-22 DIAGNOSIS — Z794 Long term (current) use of insulin: Secondary | ICD-10-CM

## 2022-10-22 MED ORDER — TRAMADOL HCL 50 MG PO TABS: 50.0000 mg | ORAL_TABLET | Freq: Every day | ORAL | 5 refills | Status: DC

## 2022-10-22 NOTE — Progress Notes (Signed)
Nursing Pain Medication Assessment:  Safety precautions to be maintained throughout the outpatient stay will include: orient to surroundings, keep bed in low position, maintain call bell within reach at all times, provide assistance with transfer out of bed and ambulation.  Medication Inspection Compliance: Charlene [name] did not comply with our request to bring her pills to be counted. She was reminded that bringing the medication bottles, even when empty, is a requirement.  Medication: None brought in. Pill/Patch Count: None available to be counted. Bottle Appearance: No container available. Did not bring bottle(s) to appointment. Filled Date: N/A Last Medication intake:  Day before yesterdaySafety precautions to be maintained throughout the outpatient stay will include: orient to surroundings, keep bed in low position, maintain call bell within reach at all times, provide assistance with transfer out of bed and ambulation.

## 2022-10-25 DIAGNOSIS — H25812 Combined forms of age-related cataract, left eye: Secondary | ICD-10-CM | POA: Insufficient documentation

## 2022-11-06 ENCOUNTER — Ambulatory Visit
Admission: RE | Admit: 2022-11-06 | Discharge: 2022-11-06 | Disposition: A | Discharge status: Home / Self Care | Payer: Medicare PPO | Source: Ambulatory Visit | Attending: Pain Medicine | Admitting: Pain Medicine

## 2022-11-06 ENCOUNTER — Ambulatory Visit: Payer: Medicare PPO | Attending: Pain Medicine | Admitting: Pain Medicine

## 2022-11-06 ENCOUNTER — Encounter: Payer: Self-pay | Admitting: Pain Medicine

## 2022-11-06 VITALS — BP 141/89 | HR 92 | Temp 97.2°F | Resp 12 | Ht 59.0 in | Wt 150.0 lb

## 2022-11-06 DIAGNOSIS — E1165 Type 2 diabetes mellitus with hyperglycemia: Secondary | ICD-10-CM | POA: Diagnosis present

## 2022-11-06 DIAGNOSIS — M545 Low back pain, unspecified: Secondary | ICD-10-CM | POA: Diagnosis present

## 2022-11-06 DIAGNOSIS — M4316 Spondylolisthesis, lumbar region: Secondary | ICD-10-CM | POA: Diagnosis present

## 2022-11-06 DIAGNOSIS — M47817 Spondylosis without myelopathy or radiculopathy, lumbosacral region: Secondary | ICD-10-CM | POA: Insufficient documentation

## 2022-11-06 DIAGNOSIS — R937 Abnormal findings on diagnostic imaging of other parts of musculoskeletal system: Secondary | ICD-10-CM | POA: Insufficient documentation

## 2022-11-06 DIAGNOSIS — G8929 Other chronic pain: Secondary | ICD-10-CM | POA: Diagnosis present

## 2022-11-06 DIAGNOSIS — M47816 Spondylosis without myelopathy or radiculopathy, lumbar region: Secondary | ICD-10-CM | POA: Insufficient documentation

## 2022-11-06 DIAGNOSIS — M5459 Other low back pain: Secondary | ICD-10-CM | POA: Insufficient documentation

## 2022-11-06 MED ORDER — ROPIVACAINE HCL 2 MG/ML IJ SOLN
INTRAMUSCULAR | Status: AC
  Filled 2022-11-06: qty 20

## 2022-11-06 MED ORDER — FENTANYL CITRATE (PF) 100 MCG/2ML IJ SOLN
INTRAMUSCULAR | Status: AC
  Filled 2022-11-06: qty 2

## 2022-11-06 MED ORDER — LACTATED RINGERS IV SOLN: Freq: Once | INTRAVENOUS | Status: AC

## 2022-11-06 MED ORDER — MIDAZOLAM HCL 5 MG/5ML IJ SOLN
INTRAMUSCULAR | Status: AC
  Filled 2022-11-06: qty 5

## 2022-11-06 MED ORDER — LIDOCAINE HCL 2 % IJ SOLN
20.0000 mL | Freq: Once | INTRAMUSCULAR | Status: AC
  Administered 2022-11-06: 400 mg

## 2022-11-06 MED ORDER — MIDAZOLAM HCL 5 MG/5ML IJ SOLN
0.5000 mg | Freq: Once | INTRAMUSCULAR | Status: AC
  Administered 2022-11-06: 2 mg via INTRAVENOUS

## 2022-11-06 MED ORDER — ROPIVACAINE HCL 2 MG/ML IJ SOLN
18.0000 mL | Freq: Once | INTRAMUSCULAR | Status: AC
  Administered 2022-11-06: 18 mL via PERINEURAL

## 2022-11-06 MED ORDER — FENTANYL CITRATE (PF) 100 MCG/2ML IJ SOLN: 25.0000 ug | INTRAMUSCULAR | Status: DC | PRN

## 2022-11-06 MED ORDER — LIDOCAINE HCL 2 % IJ SOLN
INTRAMUSCULAR | Status: AC
  Filled 2022-11-06: qty 20

## 2022-11-06 MED ORDER — PENTAFLUOROPROP-TETRAFLUOROETH EX AERO
INHALATION_SPRAY | Freq: Once | CUTANEOUS | Status: AC
  Administered 2022-11-06: 30 via TOPICAL
  Filled 2022-11-06: qty 30

## 2022-11-06 NOTE — Progress Notes (Signed)
PROVIDER NOTE: Interpretation of information contained herein should be left to medically-trained personnel. Specific patient instructions are provided elsewhere under "Patient Instructions" section of medical record. This document was created in part using STT-dictation technology, any transcriptional errors that may result from this process are unintentional.  Patient: Charlene [name] Type: Established DOB: 02/13/1960 MRN: [id number] PCP: Barbette Reichmann, MD  Service: Procedure DOS: 11/06/2022 Setting: Ambulatory Location: Ambulatory outpatient facility Delivery: Face-to-face Provider: Oswaldo Done, MD Specialty: Interventional Pain Management Specialty designation: 09 Location: Outpatient facility Ref. Prov.: Delano Metz, MD       Interventional Therapy   Procedure: Lumbar Facet, Medial Branch Block(s) #1 (w/o steroids) Laterality: Bilateral  Level: L3, L4, L5, and S1 Medial Branch Level(s). Injecting these levels blocks the L4-5 and L5-S1 lumbar facet joints. Imaging: Fluoroscopic guidance Spinal ([id number]) Anesthesia: Local anesthesia (1-2% Lidocaine) Anxiolysis: IV Versed 2.0 mg Sedation: Moderate Sedation None required. No Fentanyl administered.         DOS: 11/06/2022 Performed by: Oswaldo Done, MD  Primary Purpose: Diagnostic/Therapeutic Indications: Low back pain severe enough to impact quality of life or function. 1. Chronic low back pain (Bilateral) w/o sciatica   2. 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 Lumbar facet joint pain   4. Lumbar facet hypertrophy   5. Lumbar facet arthropathy   6. Lumbar facet syndrome   7. Spondylosis without myelopathy or radiculopathy, lumbosacral region   8. Uncontrolled type 2 diabetes mellitus with hyperglycemia (HCC)   (w/o steroids)   NAS-11 Pain score:   Pre-procedure: 2 /10   Post-procedure: 0-No pain/10     Position / Prep / Materials:  Position: Prone  Prep solution: DuraPrep (Iodine  Povacrylex [0.7% available iodine] and Isopropyl Alcohol, 74% w/w) Area Prepped: Posterolateral Lumbosacral Spine (Wide prep: From the lower border of the scapula down to the end of the tailbone and from flank to flank.)  Materials:  Tray: Block Needle(s):  Type: Spinal  Gauge (G): 25  Length: 3.5-in Qty: 3      H&P (Pre-op Assessment):  Ms. Krause is a 62 y.o. (year old), female patient, seen today for interventional treatment. She  has a past surgical history that includes arthroscopic rotator cuff; Colonoscopy; Abdominal hysterectomy; endosopic sinus; Cataract extraction w/PHACO (Right, 11/25/2017); and Eye surgery. Ms. Gieck has a current medication list which includes the following prescription(s): amlodipine, carvedilol, cholecalciferol, freestyle libre sensor system, dicyclomine, duloxetine, hydralazine, toujeo max solostar, insulin lispro, lidocaine, lisinopril, metformin, naloxone, nortriptyline, pantoprazole, polyethylene glycol, pravastatin, pregabalin, sucralfate, tramadol, baclofen, and magnesium, and the following Facility-Administered Medications: fentanyl. Her primarily concern today is the Back Pain  Initial Vital Signs:  Pulse/HCG Rate: 92ECG Heart Rate: 88 Temp: (!) 97.2 F (36.2 C) Resp: (!) 21 BP: (!) 165/85 SpO2: 100 %  BMI: Estimated body mass index is 30.3 kg/m as calculated from the following:   Height as of this encounter: 4\' 11"  (1.499 m).   Weight as of this encounter: 150 lb (68 kg).  Risk Assessment: Allergies: Reviewed. She is allergic to semaglutide.  Allergy Precautions: None required Coagulopathies: Reviewed. None identified.  Blood-thinner therapy: None at this time Active Infection(s): Reviewed. None identified. Ms. Kien is afebrile  Site Confirmation: Ms. Zacarias was asked to confirm the procedure and laterality before marking the site Procedure checklist: Completed Consent: Before the procedure and under the influence of no sedative(s),  amnesic(s), or anxiolytics, the patient was informed of the treatment options, risks and possible complications. To fulfill our ethical and  legal obligations, as recommended by the American Medical Association's Code of Ethics, I have informed the patient of my clinical impression; the nature and purpose of the treatment or procedure; the risks, benefits, and possible complications of the intervention; the alternatives, including doing nothing; the risk(s) and benefit(s) of the alternative treatment(s) or procedure(s); and the risk(s) and benefit(s) of doing nothing. The patient was provided information about the general risks and possible complications associated with the procedure. These may include, but are not limited to: failure to achieve desired goals, infection, bleeding, organ or nerve damage, allergic reactions, paralysis, and death. In addition, the patient was informed of those risks and complications associated to Spine-related procedures, such as failure to decrease pain; infection (i.e.: Meningitis, epidural or intraspinal abscess); bleeding (i.e.: epidural hematoma, subarachnoid hemorrhage, or any other type of intraspinal or peri-dural bleeding); organ or nerve damage (i.e.: Any type of peripheral nerve, nerve root, or spinal cord injury) with subsequent damage to sensory, motor, and/or autonomic systems, resulting in permanent pain, numbness, and/or weakness of one or several areas of the body; allergic reactions; (i.e.: anaphylactic reaction); and/or death. Furthermore, the patient was informed of those risks and complications associated with the medications. These include, but are not limited to: allergic reactions (i.e.: anaphylactic or anaphylactoid reaction(s)); adrenal axis suppression; blood sugar elevation that in diabetics may result in ketoacidosis or comma; water retention that in patients with history of congestive heart failure may result in shortness of breath, pulmonary edema, and  decompensation with resultant heart failure; weight gain; swelling or edema; medication-induced neural toxicity; particulate matter embolism and blood vessel occlusion with resultant organ, and/or nervous system infarction; and/or aseptic necrosis of one or more joints. Finally, the patient was informed that Medicine is not an exact science; therefore, there is also the possibility of unforeseen or unpredictable risks and/or possible complications that may result in a catastrophic outcome. The patient indicated having understood very clearly. We have given the patient no guarantees and we have made no promises. Enough time was given to the patient to ask questions, all of which were answered to the patient's satisfaction. Ms. Iglesia has indicated that she wanted to continue with the procedure. Attestation: I, the ordering provider, attest that I have discussed with the patient the benefits, risks, side-effects, alternatives, likelihood of achieving goals, and potential problems during recovery for the procedure that I have provided informed consent. Date  Time: 11/06/2022  8:31 AM   Pre-Procedure Preparation:  Monitoring: As per clinic protocol. Respiration, ETCO2, SpO2, BP, heart rate and rhythm monitor placed and checked for adequate function Safety Precautions: Patient was assessed for positional comfort and pressure points before starting the procedure. Time-out: I initiated and conducted the "Time-out" before starting the procedure, as per protocol. The patient was asked to participate by confirming the accuracy of the "Time Out" information. Verification of the correct person, site, and procedure were performed and confirmed by me, the nursing staff, and the patient. "Time-out" conducted as per Joint Commission's Universal Protocol (UP.01.01.01). Time: 1036 Start Time: 1036 hrs.  Description of Procedure:          Laterality: (see above) Targeted Levels: (see above)  Safety Precautions:  Aspiration looking for blood return was conducted prior to all injections. At no point did we inject any substances, as a needle was being advanced. Before injecting, the patient was told to immediately notify me if she was experiencing any new onset of "ringing in the ears, or metallic taste in the mouth". No  attempts were made at seeking any paresthesias. Safe injection practices and needle disposal techniques used. Medications properly checked for expiration dates. SDV (single dose vial) medications used. After the completion of the procedure, all disposable equipment used was discarded in the proper designated medical waste containers. Local Anesthesia: Protocol guidelines were followed. The patient was positioned over the fluoroscopy table. The area was prepped in the usual manner. The time-out was completed. The target area was identified using fluoroscopy. A 12-in long, straight, sterile hemostat was used with fluoroscopic guidance to locate the targets for each level blocked. Once located, the skin was marked with an approved surgical skin marker. Once all sites were marked, the skin (epidermis, dermis, and hypodermis), as well as deeper tissues (fat, connective tissue and muscle) were infiltrated with a small amount of a short-acting local anesthetic, loaded on a 10cc syringe with a 25G, 1.5-in  Needle. An appropriate amount of time was allowed for local anesthetics to take effect before proceeding to the next step. Local Anesthetic: Lidocaine 2.0% The unused portion of the local anesthetic was discarded in the proper designated containers. Technical description of process:   L3 Medial Branch Nerve Block (MBB): The target area for the L3 medial branch is at the junction of the postero-lateral aspect of the superior articular process and the superior, posterior, and medial edge of the transverse process of L4. Under fluoroscopic guidance, a Quincke needle was inserted until contact was made with os over  the superior postero-lateral aspect of the pedicular shadow (target area). After negative aspiration for blood, 0.5 mL of the nerve block solution was injected without difficulty or complication. The needle was removed intact. L4 Medial Branch Nerve Block (MBB): The target area for the L4 medial branch is at the junction of the postero-lateral aspect of the superior articular process and the superior, posterior, and medial edge of the transverse process of L5. Under fluoroscopic guidance, a Quincke needle was inserted until contact was made with os over the superior postero-lateral aspect of the pedicular shadow (target area). After negative aspiration for blood, 0.5 mL of the nerve block solution was injected without difficulty or complication. The needle was removed intact. L5 Medial Branch Nerve Block (MBB): The target area for the L5 medial branch is at the junction of the postero-lateral aspect of the superior articular process and the superior, posterior, and medial edge of the sacral ala. Under fluoroscopic guidance, a Quincke needle was inserted until contact was made with os over the superior postero-lateral aspect of the pedicular shadow (target area). After negative aspiration for blood, 0.5 mL of the nerve block solution was injected without difficulty or complication. The needle was removed intact. S1 Medial Branch Nerve Block (MBB): The target area for the S1 medial branch is at the posterior and inferior 6 o'clock position of the L5-S1 facet joint. Under fluoroscopic guidance, the Quincke needle inserted for the L5 MBB was redirected until contact was made with os over the inferior and postero aspect of the sacrum, at the 6 o' clock position under the L5-S1 facet joint (Target area). After negative aspiration for blood, 0.5 mL of the nerve block solution was injected without difficulty or complication. The needle was removed intact.  Once the entire procedure was completed, the treated area was  cleaned, making sure to leave some of the prepping solution back to take advantage of its long term bactericidal properties.         Illustration of the posterior view of the lumbar spine  and the posterior neural structures. Laminae of L2 through S1 are labeled. DPRL5, dorsal primary ramus of L5; DPRS1, dorsal primary ramus of S1; DPR3, dorsal primary ramus of L3; FJ, facet (zygapophyseal) joint L3-L4; I, inferior articular process of L4; LB1, lateral branch of dorsal primary ramus of L1; IAB, inferior articular branches from L3 medial branch (supplies L4-L5 facet joint); IBP, intermediate branch plexus; MB3, medial branch of dorsal primary ramus of L3; NR3, third lumbar nerve root; S, superior articular process of L5; SAB, superior articular branches from L4 (supplies L4-5 facet joint also); TP3, transverse process of L3.   Facet Joint Innervation (* possible contribution)  L1-2 T12, L1 (L2*)  Medial Branch  L2-3 L1, L2 (L3*)         "          "  L3-4 L2, L3 (L4*)         "          "  L4-5 L3, L4 (L5*)         "          "  L5-S1 L4, L5, S1          "          "    Vitals:   11/06/22 1035 11/06/22 1040 11/06/22 1044 11/06/22 1054  BP: (!) 168/117 (!) 172/103 (!) 142/103 (!) 141/89  Pulse:      Resp: 12 12 12    Temp:      SpO2: 100% 100% 100% 99%  Weight:      Height:         End Time: 1043 hrs.  Imaging Guidance (Spinal):          Type of Imaging Technique: Fluoroscopy Guidance (Spinal) Indication(s): Assistance in needle guidance and placement for procedures requiring needle placement in or near specific anatomical locations not easily accessible without such assistance. Exposure Time: Please see nurses notes. Contrast: None used. Fluoroscopic Guidance: I was personally present during the use of fluoroscopy. "Tunnel Vision Technique" used to obtain the best possible view of the target area. Parallax error corrected before commencing the procedure. "Direction-depth-direction"  technique used to introduce the needle under continuous pulsed fluoroscopy. Once target was reached, antero-posterior, oblique, and lateral fluoroscopic projection used confirm needle placement in all planes. Images permanently stored in EMR. Interpretation: No contrast injected. I personally interpreted the imaging intraoperatively. Adequate needle placement confirmed in multiple planes. Permanent images saved into the patient's record.  Post-operative Assessment:  Post-procedure Vital Signs:  Pulse/HCG Rate: 9288 Temp: (!) 97.2 F (36.2 C) Resp: 12 BP: (!) 141/89 SpO2: 99 %  EBL: None  Complications: No immediate post-treatment complications observed by team, or reported by patient.  Note: The patient tolerated the entire procedure well. A repeat set of vitals were taken after the procedure and the patient was kept under observation following institutional policy, for this type of procedure. Post-procedural neurological assessment was performed, showing return to baseline, prior to discharge. The patient was provided with post-procedure discharge instructions, including a section on how to identify potential problems. Should any problems arise concerning this procedure, the patient was given instructions to immediately contact us, at any time, without hesitation. In any case, we plan to contact the patient by telephone for a follow-up status report regarding this interventional procedure.  Comments:  No additional relevant information.  Plan of Care (POC)  Orders:  Orders Placed This Encounter  Procedures   LUMBAR FACET(MEDIAL BRANCH NERVE BLOCK) MBNB  Scheduling Instructions:     Procedure: Lumbar facet block (AKA.: Lumbosacral medial branch nerve block)     Side: Bilateral     Level: L4-5, L5-S1, and TBD Facets (L3, L4, L5, S1, and TBD Medial Branch Nerves)     Sedation: Patient's choice.     Timeframe: Today    Order Specific Question:   Where will this procedure be performed?     Answer:   [facility]   [facility] C-ARM 1-60 MIN NO REPORT    Intraoperative interpretation by procedural physician at [facility].    Standing Status:   Standing    Number of Occurrences:   1    Order Specific Question:   Reason for exam:    Answer:   Assistance in needle guidance and placement for procedures requiring needle placement in or near specific anatomical locations not easily accessible without such assistance.   Informed Consent Details: Physician/Practitioner Attestation; Transcribe to consent form and obtain patient signature    Nursing Order: Transcribe to consent form and obtain patient signature. Note: Always confirm laterality of pain with Ms. Joelene Millin, before procedure.    Order Specific Question:   Physician/Practitioner attestation of informed consent for procedure/surgical case    Answer:   I, the physician/practitioner, attest that I have discussed with the patient the benefits, risks, side effects, alternatives, likelihood of achieving goals and potential problems during recovery for the procedure that I have provided informed consent.    Order Specific Question:   Procedure    Answer:   Lumbar Facet Block  under fluoroscopic guidance    Order Specific Question:   Physician/Practitioner performing the procedure    Answer:   Doye Montilla A. Laban Emperor MD    Order Specific Question:   Indication/Reason    Answer:   Low Back Pain, with our without leg pain, due to Facet Joint Arthralgia (Joint Pain) Spondylosis (Arthritis of the Spine), without myelopathy or radiculopathy (Nerve Damage).   Provide equipment / supplies at bedside    Procedure tray: "Block Tray" (Disposable  single use) Skin infiltration needle: Regular 1.5-in, 25-G, (x1) Block Needle type: Spinal Amount/quantity: 3 Size: Regular (3.5-inch) Gauge: 25G    Standing Status:   Standing    Number of Occurrences:   1    Order Specific Question:   Specify    Answer:   Block Tray    Chronic Opioid Analgesic:  Tramadol 50 mg, 1 tab PO QD. ABNORMAL UDS (09/03/2020) (+) for COCAINE. MME: 5 mg/day   Medications ordered for procedure: Meds ordered this encounter  Medications   lidocaine (XYLOCAINE) 2 % (with pres) injection 400 mg   pentafluoroprop-tetrafluoroeth (GEBAUERS) aerosol   lactated ringers infusion   midazolam (VERSED) 5 MG/5ML injection 0.5-2 mg    Make sure Flumazenil is available in the pyxis when using this medication. If oversedation occurs, administer 0.2 mg IV over 15 sec. If after 45 sec no response, administer 0.2 mg again over 1 min; may repeat at 1 min intervals; not to exceed 4 doses (1 mg)   fentaNYL (SUBLIMAZE) injection 25-50 mcg    Make sure Narcan is available in the pyxis when using this medication. In the event of respiratory depression (RR< 8/min): Titrate NARCAN (naloxone) in increments of 0.1 to 0.2 mg IV at 2-3 minute intervals, until desired degree of reversal.   ropivacaine (PF) 2 mg/mL (0.2%) (NAROPIN) injection 18 mL   Medications administered: We administered lidocaine, pentafluoroprop-tetrafluoroeth, lactated ringers, midazolam, and ropivacaine (PF)  2 mg/mL (0.2%).  See the medical record for exact dosing, route, and time of administration.  Follow-up plan:   Return in about 2 weeks (around 11/20/2022) for (Face2F), (PPE).       Interventional Therapies  Risk Factors  Considerations:   NOTE: UNCONTROLLED IDDM (NO STEROIDS)  ABNORMAL UDS (09/03/2020) (+) for COCAINE       Planned  Pending:  (NO STEROIDS)  Diagnostic bilateral L4-5 and L5-S1 lumbar facet MBB #1 (no steroids)    Under consideration:   Diagnostic L4-5 and L5-S1 lumbar facet MBB (no steroids)  Diagnostic left genicular nerve block #1 (no steroids) (to be done after lumbar facet block.) Possible bilateral lumbar facet RFA  Diagnostic bilateral genicular NB    Completed:   Diagnostic right lumbar facet MBB x4 (no steroid) (05/13/2022) (100/100/75/75)   Diagnostic left lumbar facet MBB x1 (w/ steroid) (10/09/2020) (100/100/90/L-100; R-50)  Therapeutic left L3-4 LESI x1 (02/11/2022) (100/100/75/100)  Therapeutic left L4-5 LESI x4 (09/21/2018) (100/100/50/70)  Therapeutic left L5 TFESI x4 (09/21/2018) (100/100/50/70)  Therapeutic left L5-S1 LESI x2 (02/25/2018) (100/100/100/100)  Therapeutic bilateral Hyalgan knee inj. x5 (02/25/2018) (100/100/100 x2 weeks/<50)  Therapeutic left IA steroid knee inj. x1 (11/23/2017) (100/100/0/0)  Therapeutic left IA Gelsyn-3 knee inj. x3 (02/11/2022) (100/100/75/100) Therapeutic left IA Monovisc knee inj. x1 (03/18/2022) (100/100/50/50)    Therapeutic  Palliative (PRN) options:  (NO PRN PROCEDURES W/O MD F2F EVALUATION) Palliative L5 TFESI  Palliative L4-5 LESI   Palliative L5-S1 LESI   Palliative Hyalgan knee inj.       Recent Visits Date Type Provider Dept  10/22/22 Office Visit Delano Metz, MD [facility]  Showing recent visits within past 90 days and meeting all other requirements Today's Visits Date Type Provider Dept  11/06/22 Procedure visit Delano Metz, MD [facility]  Showing today's visits and meeting all other requirements Future Appointments Date Type Provider Dept  11/20/22 Appointment Delano Metz, MD [facility]  Showing future appointments within next 90 days and meeting all other requirements  Disposition: Discharge home  Discharge (Date  Time): 11/06/2022; 1101 hrs.   Primary Care Physician: Barbette Reichmann, MD Location: [facility] Note by: Oswaldo Done, MD (TTS technology used. I apologize for any typographical errors that were not detected and corrected.) Date: 11/06/2022; Time: 12:25 PM  Disclaimer:  Medicine is not an Visual merchandiser. The only guarantee in medicine is that nothing is guaranteed. It is important to note that the decision to proceed with this intervention was based on the  information collected from the patient. The Data and conclusions were drawn from the patient's questionnaire, the interview, and the physical examination. Because the information was provided in large part by the patient, it cannot be guaranteed that it has not been purposely or unconsciously manipulated. Every effort has been made to obtain as much relevant data as possible for this evaluation. It is important to note that the conclusions that lead to this procedure are derived in large part from the available data. Always take into account that the treatment will also be dependent on availability of resources and existing treatment guidelines, considered by other Pain Management Practitioners as being common knowledge and practice, at the time of the intervention. For Medico-Legal purposes, it is also important to point out that variation in procedural techniques and pharmacological choices are the acceptable norm. The indications, contraindications, technique, and results of the above procedure should only be interpreted and judged by a Board-Certified Interventional Pain Specialist  with extensive familiarity and expertise in the same exact procedure and technique.

## 2022-11-06 NOTE — Patient Instructions (Signed)
______________________________________________________________________    Post-Procedure Discharge Instructions  Instructions: Apply ice:  Purpose: This will minimize any swelling and discomfort after procedure.  When: Day of procedure, as soon as you get home. How: Fill a plastic sandwich bag with crushed ice. Cover it with a small towel and apply to injection site. How long: (15 min on, 15 min off) Apply for 15 minutes then remove x 15 minutes.  Repeat sequence on day of procedure, until you go to bed. Apply heat:  Purpose: To treat any soreness and discomfort from the procedure. When: Starting the next day after the procedure. How: Apply heat to procedure site starting the day following the procedure. How long: May continue to repeat daily, until discomfort goes away. Food intake: Start with clear liquids (like water) and advance to regular food, as tolerated.  Physical activities: Keep activities to a minimum for the first 8 hours after the procedure. After that, then as tolerated. Driving: If you have received any sedation, be responsible and do not drive. You are not allowed to drive for 24 hours after having sedation. Blood thinner: (Applies only to those taking blood thinners) You may restart your blood thinner 6 hours after your procedure. Insulin: (Applies only to Diabetic patients taking insulin) As soon as you can eat, you may resume your normal dosing schedule. Infection prevention: Keep procedure site clean and dry. Shower daily and clean area with soap and water. Post-procedure Pain Diary: Extremely important that this be done correctly and accurately. Recorded information will be used to determine the next step in treatment. For the purpose of accuracy, follow these rules: Evaluate only the area treated. Do not report or include pain from an untreated area. For the purpose of this evaluation, ignore all other areas of pain, except for the treated area. After your procedure,  avoid taking a long nap and attempting to complete the pain diary after you wake up. Instead, set your alarm clock to go off every hour, on the hour, for the initial 8 hours after the procedure. Document the duration of the numbing medicine, and the relief you are getting from it. Do not go to sleep and attempt to complete it later. It will not be accurate. If you received sedation, it is likely that you were given a medication that may cause amnesia. Because of this, completing the diary at a later time may cause the information to be inaccurate. This information is needed to plan your care. Follow-up appointment: Keep your post-procedure follow-up evaluation appointment after the procedure (usually 2 weeks for most procedures, 6 weeks for radiofrequencies). DO NOT FORGET to bring you pain diary with you.   Expect: (What should I expect to see with my procedure?) From numbing medicine (AKA: Local Anesthetics): Numbness or decrease in pain. You may also experience some weakness, which if present, could last for the duration of the local anesthetic. Onset: Full effect within 15 minutes of injected. Duration: It will depend on the type of local anesthetic used. On the average, 1 to 8 hours.  From steroids NONE USED From procedure: Some discomfort is to be expected once the numbing medicine wears off. This should be minimal if ice and heat are applied as instructed.  Call if: (When should I call?) You experience numbness and weakness that gets worse with time, as opposed to wearing off. New onset bowel or bladder incontinence. (Applies only to procedures done in the spine)  Emergency Numbers: Durning business hours (Monday - Thursday, 8:00 AM -  4:00 PM) (Friday, 9:00 AM - 12:00 Noon): [phone] After hours: [phone] NOTE: If you are having a problem and are unable connect with, or to talk to a provider, then go to your nearest urgent care or emergency department. If the problem is serious  and urgent, please call 911. ______________________________________________________________________

## 2022-11-06 NOTE — Progress Notes (Signed)
Safety precautions to be maintained throughout the outpatient stay will include: orient to surroundings, keep bed in low position, maintain call bell within reach at all times, provide assistance with transfer out of bed and ambulation.  

## 2022-11-07 ENCOUNTER — Telehealth: Payer: Self-pay

## 2022-11-07 ENCOUNTER — Telehealth: Payer: Self-pay | Admitting: Pain Medicine

## 2022-11-07 NOTE — Telephone Encounter (Signed)
Post procedue follow up.  LM

## 2022-11-07 NOTE — Telephone Encounter (Signed)
PT called states that she was returning nurse call. Please give patient a call. TY

## 2022-11-07 NOTE — Telephone Encounter (Signed)
Patient returned call for post procedure follow up.  I attempted to call her back and got answering machine again.  lm

## 2022-11-19 NOTE — Progress Notes (Unsigned)
PROVIDER NOTE: Information contained herein reflects review and annotations entered in association with encounter. Interpretation of such information and data should be left to medically-trained personnel. Information provided to patient can be located elsewhere in the medical record under "Patient Instructions". Document created using STT-dictation technology, any transcriptional errors that may result from process are unintentional.    Patient: Charlene [name]  Service Category: E/M  Provider: Oswaldo Done, MD  DOB: [birth year]  DOS: 11/20/2022  Referring Provider: Barbette Reichmann, MD  MRN: [id number]  Specialty: Interventional Pain Management  PCP: Barbette Reichmann, MD  Type: Established Patient  Setting: Ambulatory outpatient    Location: Office  Delivery: Face-to-face     HPI  [name], a 62 y.o. year old female, is here today because of her No primary diagnosis found.. Ms. Temby primary complain today is No chief complaint on file.  Pertinent problems: Ms. Perry has Burning sensation of feet; Neuropathic pain; Neuropathy; Lower extremity numbness and tingling (Left); Polyneuropathy associated with underlying disease (HCC); Chronic ankle pain (Left); Chronic foot pain (Left); Chronic lower extremity pain (2ry area of Pain) (Bilateral) (L>R); Chronic knee pain (4th area of Pain) (Bilateral) (L>R); Chronic low back pain (3ry area of Pain) (Bilateral) (L>R) w/ sciatica (Left); Chronic pain syndrome; DM type 2 with diabetic peripheral neuropathy (HCC); DDD (degenerative disc disease), lumbar; Lumbar facet arthropathy; Lumbar facet syndrome; Lumbar facet hypertrophy; Lumbar foraminal stenosis (L5-S1) (Right); Osteoarthritis of knee (Bilateral); Chronic musculoskeletal pain; Abnormal EMG (07/06/2017); Osteoarthritis involving multiple joints; 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); Knee pain; Back pain with left-sided sciatica; Meningioma (HCC); Neck pain;  Chronic pain disorder; Left leg weakness; Meralgia paraesthetica, left; Pain in both lower extremities; Arthropathy of lumbar facet joint; Chronic ankle and foot pain (1ry area of Pain) (Left); 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 Pain Assessment: Severity of   is reported as a  /10. Location:    / . Onset:  . Quality:  . Timing:  . Modifying factor(s):  Marland Kitchen Vitals:  vitals were not taken for this visit.  BMI: Estimated body mass index is 30.3 kg/m as calculated from the following:   Height as of 11/06/22: 4\' 11"  (1.499 m).   Weight as of 11/06/22: 150 lb (68 kg). Last encounter: 10/22/2022. Last procedure: 11/06/2022.  Reason for encounter: post-procedure evaluation and assessment. ***  Post-procedure evaluation   Procedure: Lumbar Facet, Medial Branch Block(s) #1 (w/o steroids) Laterality: Bilateral  Level: L3, L4, L5, and S1 Medial Branch Level(s). Injecting these levels blocks the L4-5 and L5-S1 lumbar facet joints. Imaging: Fluoroscopic guidance Spinal ([id number]) Anesthesia: Local anesthesia (1-2% Lidocaine) Anxiolysis: IV Versed 2.0 mg Sedation: Moderate Sedation None required. No Fentanyl administered.         DOS: 11/06/2022 Performed by: Oswaldo Done, MD  Primary Purpose: Diagnostic/Therapeutic Indications: Low back pain severe enough to impact quality of life or function. 1. Chronic low back pain  (Bilateral) w/o sciatica   2. 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 Lumbar facet  joint pain   4. Lumbar facet hypertrophy   5. Lumbar facet arthropathy   6. Lumbar facet syndrome   7. Spondylosis without myelopathy or radiculopathy, lumbosacral region   8. Uncontrolled type 2 diabetes mellitus with hyperglycemia (HCC)   (w/o steroids)   NAS-11 Pain score:   Pre-procedure: 2 /10   Post-procedure: 0-No pain/10      Effectiveness:  Initial hour after procedure:   ***. Subsequent 4-6 hours post-procedure:   ***. Analgesia past initial 6 hours:   ***. Ongoing improvement:  Analgesic:  *** Function:    ***    ROM:    ***     Pharmacotherapy Assessment  Analgesic: Tramadol 50 mg, 1 tab PO QD. ABNORMAL UDS (09/03/2020) (+) for COCAINE. MME: 5 mg/day   Monitoring: [place] PMP: PDMP reviewed during this encounter.       Pharmacotherapy: No side-effects or adverse reactions reported. Compliance: No problems identified. Effectiveness: Clinically acceptable.  No notes on file  No results found for: "CBDTHCR" No results found for: "D8THCCBX" No results found for: "D9THCCBX"  UDS:  Summary  Date Value Ref Range Status  02/26/2022 Note  Final    Comment:    ==================================================================== [employer] Select 13 (MW) ==================================================================== Test                             Result       Flag       Units  Drug Present and Declared for Prescription Verification   Tramadol                       4758         EXPECTED   ng/mg creat   O-Desmethyltramadol            >6173        EXPECTED   ng/mg creat   N-Desmethyltramadol            1183         EXPECTED   ng/mg creat    Source of tramadol is a prescription medication. O-desmethyltramadol    and N-desmethyltramadol are expected metabolites of tramadol.  ==================================================================== Test                       Result    Flag   Units      Ref Range   Creatinine              81               mg/dL      >=40 ==================================================================== Declared Medications:  The flagging and interpretation on this report are based on the  following declared medications.  Unexpected results may arise from  inaccuracies in the declared medications.   **Note: The testing scope of this panel includes these medications:   Tramadol (Ultram)   **Note: The testing scope of this panel does not include the  following reported medications:   Amlodipine (Norvasc)  Baclofen (Lioresal)  Carvedilol (Coreg)  Cholecalciferol  Dicyclomine (Bentyl)  Duloxetine (Cymbalta)  Hydralazine (Apresoline)  Insulin (Toujeo)  Lisinopril (Zestril)  Magnesium  Metformin (Glucophage)  Nortriptyline (Pamelor)  Pantoprazole (Protonix)  Polyethylene Glycol (MiraLAX)  Pravastatin (Pravachol)  Pregabalin (Lyrica)  Sucralfate (Carafate)  Topical Lidocaine (Lidoderm) ==================================================================== For clinical consultation, please call [phone]. ====================================================================       ROS  Constitutional: Denies any fever or chills Gastrointestinal: No  reported hemesis, hematochezia, vomiting, or acute GI distress Musculoskeletal: Denies any acute onset joint swelling, redness, loss of ROM, or weakness Neurological: No reported episodes of acute onset apraxia, aphasia, dysarthria, agnosia, amnesia, paralysis, loss of coordination, or loss of consciousness  Medication Review  Cholecalciferol, DULoxetine, FreeStyle Libre Sensor System, Magnesium, amLODipine, baclofen, carvedilol, dicyclomine, hydrALAZINE, insulin glargine (2 Unit Dial), insulin lispro, lidocaine, lisinopril, metFORMIN, naloxone, nortriptyline, pantoprazole, polyethylene glycol, pravastatin, pregabalin, sucralfate, and traMADol  History Review   Allergy: Ms. Harlos is allergic to semaglutide. Drug: Ms. Barroga  reports no history of drug use. Alcohol:  reports that she does not currently use alcohol. Tobacco:  reports that she has never smoked. She has never used smokeless tobacco. Social: Ms. Wadler  reports that she has never smoked. She has never used smokeless tobacco. She reports that she does not currently use alcohol. She reports that she does not use drugs. Medical:  has a past medical history of Arthritis, Degenerative disc disease, lumbar, Diabetes mellitus without complication (HCC), GERD (gastroesophageal reflux disease), Hypertension, Neuropathy, Polyneuropathy, and Polyneuropathy. Surgical: Ms. Boch  has a past surgical history that includes arthroscopic rotator cuff; Colonoscopy; Abdominal hysterectomy; endosopic sinus; Cataract extraction w/PHACO (Right, 11/25/2017); and Eye surgery. Family: family history includes Alcohol abuse in her father; Breast cancer in her maternal aunt; Cancer in her father.  Laboratory Chemistry Profile   Renal Lab Results  Component Value Date   BUN 24 (H) 07/05/2022   CREATININE 1.58 (H) 07/05/2022   BCR 17 10/12/2017   GFRAA >60 12/23/2018   GFRNONAA 37 (L) 07/05/2022    Hepatic Lab Results  Component Value Date   AST 17 10/21/2018   ALT 21 10/21/2018   ALBUMIN 3.6 10/21/2018   ALKPHOS 81 10/21/2018   LIPASE 20 10/21/2018    Electrolytes Lab Results  Component Value Date   NA 137 07/05/2022   K 3.2 (L) 07/05/2022   CL 103 07/05/2022   CALCIUM 9.0 07/05/2022   MG 2.0 10/12/2017    Bone Lab Results  Component Value Date   25OHVITD1 17 (L) 10/12/2017   25OHVITD2 <1.0 10/12/2017   25OHVITD3 17 10/12/2017    Inflammation (CRP: Acute Phase) (ESR: Chronic Phase) Lab Results  Component Value Date   CRP 4 10/12/2017   ESRSEDRATE 12 10/12/2017         Note: Above Lab results reviewed.  Recent Imaging Review  DG PAIN CLINIC C-ARM 1-60 MIN NO REPORT Fluoro was used,  but no Radiologist interpretation will be provided.  Please refer to "NOTES" tab for provider progress note. Note: Reviewed        Physical Exam  General appearance: Well nourished, well developed, and well hydrated. In no apparent acute distress Mental status: Alert, oriented x 3 (person, place, & time)       Respiratory: No evidence of acute respiratory distress Eyes: PERLA Vitals: There were no vitals taken for this visit. BMI: Estimated body mass index is 30.3 kg/m as calculated from the following:   Height as of 11/06/22: 4\' 11"  (1.499 m).   Weight as of 11/06/22: 150 lb (68 kg). Ideal: Patient must be at least 60 in tall to calculate ideal body weight  Assessment   Diagnosis Status  No diagnosis found. Controlled Controlled Controlled   Updated Problems: No problems updated.  Plan of Care  Problem-specific:  No problem-specific Assessment & Plan notes found for this encounter.  Ms. TOBA CARNEY has a current medication list which includes the following long-term medication(s):  amlodipine, baclofen, carvedilol, cholecalciferol, dicyclomine, duloxetine, hydralazine, toujeo max solostar, insulin lispro, lisinopril, magnesium, nortriptyline, pantoprazole, pravastatin, pregabalin, sucralfate, and tramadol.  Pharmacotherapy (Medications Ordered): No orders of the defined types were placed in this encounter.  Orders:  No orders of the defined types were placed in this encounter.  Follow-up plan:   No follow-ups on file.      Interventional Therapies  Risk Factors  Considerations:   NOTE: UNCONTROLLED IDDM (NO STEROIDS)  ABNORMAL UDS (09/03/2020) (+) for COCAINE       Planned  Pending:  (NO STEROIDS)  Diagnostic bilateral L4-5 and L5-S1 lumbar facet MBB #1 (no steroids)    Under consideration:   Diagnostic L4-5 and L5-S1 lumbar facet MBB (no steroids)  Diagnostic left genicular nerve block #1 (no steroids) (to be done after lumbar facet block.) Possible  bilateral lumbar facet RFA  Diagnostic bilateral genicular NB    Completed:   Diagnostic right lumbar facet MBB x4 (no steroid) (05/13/2022) (100/100/75/75)  Diagnostic left lumbar facet MBB x1 (w/ steroid) (10/09/2020) (100/100/90/L-100; R-50)  Therapeutic left L3-4 LESI x1 (02/11/2022) (100/100/75/100)  Therapeutic left L4-5 LESI x4 (09/21/2018) (100/100/50/70)  Therapeutic left L5 TFESI x4 (09/21/2018) (100/100/50/70)  Therapeutic left L5-S1 LESI x2 (02/25/2018) (100/100/100/100)  Therapeutic bilateral Hyalgan knee inj. x5 (02/25/2018) (100/100/100 x2 weeks/<50)  Therapeutic left IA steroid knee inj. x1 (11/23/2017) (100/100/0/0)  Therapeutic left IA Gelsyn-3 knee inj. x3 (02/11/2022) (100/100/75/100) Therapeutic left IA Monovisc knee inj. x1 (03/18/2022) (100/100/50/50)    Therapeutic  Palliative (PRN) options:  (NO PRN PROCEDURES W/O MD F2F EVALUATION) Palliative L5 TFESI  Palliative L4-5 LESI   Palliative L5-S1 LESI   Palliative Hyalgan knee inj.        Recent Visits Date Type Provider Dept  11/06/22 Procedure visit Delano Metz, MD [facility]  10/22/22 Office Visit Delano Metz, MD [facility]  Showing recent visits within past 90 days and meeting all other requirements Future Appointments Date Type Provider Dept  11/20/22 Appointment Delano Metz, MD [facility]  Showing future appointments within next 90 days and meeting all other requirements  I discussed the assessment and treatment plan with the patient. The patient was provided an opportunity to ask questions and all were answered. The patient agreed with the plan and demonstrated an understanding of the instructions.  Patient advised to call back or seek an in-person evaluation if the symptoms or condition worsens.  Duration of encounter: *** minutes.  Total time on encounter, as per AMA guidelines included both the face-to-face and non-face-to-face time personally spent  by the physician and/or other qualified health care professional(s) on the day of the encounter (includes time in activities that require the physician or other qualified health care professional and does not include time in activities normally performed by clinical staff). Physician's time may include the following activities when performed: Preparing to see the patient (e.g., pre-charting review of records, searching for previously ordered imaging, lab work, and nerve conduction tests) Review of prior analgesic pharmacotherapies. Reviewing PMP Interpreting ordered tests (e.g., lab work, imaging, nerve conduction tests) Performing post-procedure evaluations, including interpretation of diagnostic procedures Obtaining and/or reviewing separately obtained history Performing a medically appropriate examination and/or evaluation Counseling and educating the patient/family/caregiver Ordering medications, tests, or procedures Referring and communicating with other health care professionals (when not separately reported) Documenting clinical information in the electronic or other health record Independently interpreting results (not separately reported) and communicating results to the patient/ family/caregiver Care coordination (not separately reported)  Note  by: Oswaldo Done, MD Date: 11/20/2022; Time: 9:06 AM

## 2022-11-20 ENCOUNTER — Ambulatory Visit: Payer: Medicare PPO | Admitting: Pain Medicine

## 2022-11-20 DIAGNOSIS — Z09 Encounter for follow-up examination after completed treatment for conditions other than malignant neoplasm: Secondary | ICD-10-CM

## 2022-11-20 DIAGNOSIS — Z91199 Patient's noncompliance with other medical treatment and regimen due to unspecified reason: Secondary | ICD-10-CM

## 2022-12-12 ENCOUNTER — Ambulatory Visit (HOSPITAL_COMMUNITY): Payer: Medicare PPO | Attending: Neurology

## 2022-12-12 ENCOUNTER — Ambulatory Visit (HOSPITAL_COMMUNITY): Payer: Medicare PPO

## 2022-12-12 ENCOUNTER — Other Ambulatory Visit: Payer: Self-pay

## 2022-12-12 DIAGNOSIS — M5459 Other low back pain: Secondary | ICD-10-CM | POA: Insufficient documentation

## 2022-12-12 DIAGNOSIS — M6281 Muscle weakness (generalized): Secondary | ICD-10-CM | POA: Insufficient documentation

## 2022-12-12 DIAGNOSIS — R262 Difficulty in walking, not elsewhere classified: Secondary | ICD-10-CM | POA: Diagnosis present

## 2022-12-12 DIAGNOSIS — R293 Abnormal posture: Secondary | ICD-10-CM | POA: Insufficient documentation

## 2022-12-12 DIAGNOSIS — R2689 Other abnormalities of gait and mobility: Secondary | ICD-10-CM | POA: Insufficient documentation

## 2022-12-12 NOTE — Therapy (Signed)
OUTPATIENT PHYSICAL THERAPY NEURO EVALUATION   Patient Name: Charlene [name] MRN: [id number] DOB:[birth year], 62 y.o., female Today's Date: 12/12/2022   PCP: Barbette Reichmann, MD REFERRING PROVIDER: Morene Crocker, MD  END OF SESSION:  PT End of Session - 12/12/22 1531     Visit Number 1    Number of Visits 8    Date for PT Re-Evaluation 01/09/23    Authorization Type [employer] (requested 8 visits)    PT Start Time 0810    PT Stop Time 0845    PT Time Calculation (min) 35 min    Equipment Utilized During Treatment Gait belt    Activity Tolerance Patient tolerated treatment well    Behavior During Therapy WFL for tasks assessed/performed            Past Medical History:  Diagnosis Date   Arthritis    knees   Degenerative disc disease, lumbar    Diabetes mellitus without complication (HCC)    GERD (gastroesophageal reflux disease)    Hypertension    Neuropathy    left lower leg   Polyneuropathy    Polyneuropathy    Past Surgical History:  Procedure Laterality Date   ABDOMINAL HYSTERECTOMY     arthroscopic rotator cuff     CATARACT EXTRACTION W/PHACO Right 11/25/2017   Procedure: CATARACT EXTRACTION PHACO AND INTRAOCULAR LENS PLACEMENT (IOC) RIGHT DIABETIC;  Surgeon: Lockie Mola, MD;  Location: [facility];  Service: Ophthalmology;  Laterality: Right;  Diabetic - insulin and oral meds   COLONOSCOPY     endosopic sinus     EYE SURGERY     Patient Active Problem List   Diagnosis Date Noted   Poor balance 09/25/2022   Lumbar facet joint pain 05/29/2022   Grade 1 Retrolisthesis of L2/L3 05/13/2022   Pain of patellofemoral joint (Left) 03/18/2022   Chronic knee pain (Left) 12/10/2021    Class: Chronic   Osteoarthritis of knee (Left) 12/10/2021    Class: Chronic   Abnormal x-ray of lumbar spine (03/16/2021) 10/30/2021   Osteopenia determined by x-ray 10/30/2021   Closed compression fracture of L3 lumbar vertebra, sequela 10/30/2021    History of compression fracture of vertebral column 10/30/2021   Spinal column pain 10/30/2021   Chronic sacroiliac joint pain (Right) 03/11/2021   Diabetic retinopathy (HCC) 12/17/2020   Hypertensive retinopathy of both eyes 12/17/2020   NPDR (nonproliferative diabetic retinopathy) (HCC) 12/17/2020   Pseudophakia of right eye 12/17/2020   Disease of thyroid gland 12/17/2020   Abnormal drug screen (09/03/2020) 12/06/2020   Pain medication agreement broken (12/06/2020) 12/06/2020   History of cocaine use (09/03/2020) 12/06/2020   History of illicit drug use (09/03/2020) 12/06/2020   Substance use disorder 12/06/2020   Spondylosis without myelopathy or radiculopathy, lumbosacral region 10/09/2020   Chronic low back pain (Bilateral) w/o sciatica 10/09/2020   Chronic ankle and foot pain (1ry area of Pain) (Left) 09/03/2020   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) 09/03/2020   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) 09/03/2020   Chronic use of opiate for therapeutic purpose 07/08/2020   Pain medication agreement signed 11/08/2019   Meningioma (HCC) 11/01/2019   Hx of resection of meningioma 09/14/2019   Osteoporosis 08/05/2019   Neck pain 03/11/2019   Knee pain 11/18/2018   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) 10/06/2018   Osteoarthritis involving multiple joints 08/04/2018   Type 2 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, with long-term  current use of insulin (HCC) 03/22/2018   Abnormal EMG (07/06/2017) 11/23/2017   Long-term insulin use (HCC) 11/10/2017   Uncontrolled type 2 diabetes mellitus with hyperglycemia (HCC) 11/10/2017   Vitamin D deficiency 10/21/2017   DM type 2 with diabetic peripheral neuropathy (HCC) 10/21/2017   DDD (degenerative disc disease), lumbar 10/21/2017   Lumbar facet arthropathy 10/21/2017   Lumbar facet syndrome 10/21/2017   Lumbar facet hypertrophy  10/21/2017   Lumbar foraminal stenosis (L5-S1) (Right) 10/21/2017   Osteoarthritis of knee (Bilateral) 10/21/2017   Chronic musculoskeletal pain 10/21/2017   Arthropathy of lumbar facet joint 10/21/2017   Essential hypertension 10/12/2017   Chronic ankle pain (Left) 10/12/2017   Chronic foot pain (Left) 10/12/2017   Chronic lower extremity pain (2ry area of Pain) (Bilateral) (L>R) 10/12/2017   Chronic knee pain (4th area of Pain) (Bilateral) (L>R) 10/12/2017   Chronic low back pain (3ry area of Pain) (Bilateral) (L>R) w/ sciatica (Left) 10/12/2017   Chronic pain syndrome 10/12/2017   Opiate use 10/12/2017   Pharmacologic therapy 10/12/2017   Disorder of skeletal system 10/12/2017   Problems influencing health status 10/12/2017   Chronic pain disorder 10/12/2017   Neuropathy 08/11/2017   Burning sensation of feet 07/06/2017   Lower extremity numbness and tingling (Left) 07/06/2017   Left leg weakness 07/06/2017   Pain in both lower extremities 06/24/2017   Polyneuropathy associated with underlying disease (HCC) 05/29/2017   DM type 2, uncontrolled, with neuropathy 05/05/2017   Back pain with left-sided sciatica 10/16/2015   Meralgia paraesthetica, left 10/16/2015   Neuropathic pain 06/29/2015   GERD without esophagitis 01/16/2014   Diabetes mellitus (HCC) 01/21/2003    ONSET DATE: A year ago  REFERRING DIAG: R29.6 (ICD-10-CM) - Frequent falls  THERAPY DIAG:  Difficulty in walking, not elsewhere classified  Muscle weakness (generalized)  Other abnormalities of gait and mobility  Rationale for Evaluation and Treatment: Rehabilitation  SUBJECTIVE:                                                                                                                                                                                             SUBJECTIVE STATEMENT: EVAL: Arrives to the clinic due to frequent falls and unsteadiness when walking and doing stairs. Patient states that she  can be standing on a sink and then she would have a tendency to lean back. At times, patient has a hard time catching herself. When walking a lot, patient's L knee can give way at times. Denies any dizziness and numbness to the LE. Condition started about a year ago without reason which gradually got progressively worse. 4 months ago,  patient went to her MD. Patient was given exercises by her MD for the balance but claims that she's not doing it. Per patient, no ancillary procedures were done to further examine the issue. Patient had a hx of brain mass removal in 2019. Patient then had a virtual visit and then referred her to outpatient PT evaluation and management. Pt accompanied by: self  PERTINENT HISTORY: hx of brain mass removal in 2019, diabetic retinopath, low back pain  PAIN:  Are you having pain? No  PRECAUTIONS: Fall  RED FLAGS: Bowel or bladder incontinence: No, Spinal tumors: No, Cauda equina syndrome: No, and Compression fracture: No   WEIGHT BEARING RESTRICTIONS: No  FALLS: Has patient fallen in last 6 months? Yes. Number of falls 3-4  LIVING ENVIRONMENT: Lives with: lives with their family Lives in: House/apartment Stairs: Yes: External: 2 steps; bilateral but cannot reach both Has following equipment at home: Single point cane, Walker - 4 wheeled, and shower chair  PLOF: Independent and Independent with basic ADLs  PATIENT GOALS: "to have better strength with my legs and keep my balance"  OBJECTIVE:  Note: Objective measures were completed at Evaluation unless otherwise noted.  DIAGNOSTIC FINDINGS: None performed to date  COGNITION: Overall cognitive status: Within functional limits for tasks assessed   SENSATION: WFL  COORDINATION: Alternating toe taps: intact, WFL Heel-to-shin: intact, WFL  MUSCLE LENGTH: Hamstrings: mild restriction on B Gastrocnemius: moderate restriction on L, mild restriction on R  POSTURE: rounded shoulders and forward  head  LOWER EXTREMITY ROM:  B hip flex/abd, B knee flex/ext, B ankle DF/PF are [facility] when done actively  LOWER EXTREMITY MMT:    MMT Right Eval Left Eval  Hip flexion 4 3+  Hip extension    Hip abduction 4 4  Hip adduction    Hip internal rotation    Hip external rotation    Knee flexion 4+ 4+  Knee extension 4 4  Ankle dorsiflexion 4 3-  Ankle plantarflexion 4 4-  Ankle inversion    Ankle eversion    (Blank rows = not tested)   TRANSFERS: Assistive device utilized: None  Sit to stand: Complete Independence Stand to sit: Complete Independence  STAIRS: Level of Assistance: Complete Independence Stair Negotiation Technique:  Bilateral Rails Number of Stairs: 4  Height of Stairs: 7"  Comments: step-to pattern when ascending, alternating pattern when descending, handrails on both instances  GAIT: Gait pattern: decreased step length- Right, decreased step length- Left, decreased stance time- Right, decreased stance time- Left, decreased stride length, trendelenburg, lateral lean- Right, lateral lean- Left, wide BOS, poor foot clearance- Right, and poor foot clearance- Left Distance walked: 351 ft Assistive device utilized: None Level of assistance: Complete Independence Comments: done during  FUNCTIONAL TESTS:  5 times sit to stand: 13.33 sec 2 minute walk test: 351 ft Dynamic Gait Index: 12  DGI 12/12/22 1. Gait level surface (2) Mild Impairment: Walks 20', uses assistive devices, slower speed, mild gait deviations. 2. Change in gait speed (2) Mild Impairment: Is able to change speed but demonstrates mild gait deviations, or not gait deviations but unable to achieve a significant change in velocity, or uses an assistive device. 3. Gait with horizontal head turns (1) Moderate Impairment: Performs head turns with moderate change in gait velocity, slows down, staggers but recovers, can continue to walk. 4. Gait with vertical head turns 1) Moderate Impairment:  Performs head turns with moderate change in gait velocity, slows down, staggers but recovers, can continue to  walk. 5. Gait and pivot turn (2) Mild Impairment: Pivot turns safely in > 3 seconds and stops with no loss of balance. 6. Step over obstacle (1) Moderate Impairment: Is able to step over box but must stop, then step over. May require verbal cueing. 7. Step around obstacles (2) Mild Impairment: Is able to step around both cones, but must slow down and adjust steps to clear cones. 8. Stairs (1) Moderate Impairment: Two feet to a stair, must use rail.  TOTAL SCORE: 12 / 24   PATIENT SURVEYS:  ABC scale 590/1600 = 36.9%  TODAY'S TREATMENT:                                                                                                                              DATE:  12/12/22 Evaluation and patient education done    PATIENT EDUCATION: Education details: Educated on the pathoanatomy of balance deficits. Educated on the goals and course of rehab. Educated on measures to lower risk of falls at home. Education of possible use of cane on the R. Person educated: Patient Education method: Explanation Education comprehension: verbalized understanding  HOME EXERCISE PROGRAM: None provided to date  GOALS: Goals reviewed with patient? Yes  SHORT TERM GOALS: Target date: 12/26/22  Pt will demonstrate indep in HEP to facilitate carry-over of skilled services and improve functional outcomes Goal status: INITIAL  LONG TERM GOALS: Target date: 01/09/23  Pt will improve ABC scale score to 70% in order to demonstrate improved confidence with balance and safety during ambulation and other ADLs  Baseline: 36.9% Goal status: INITIAL  2.  Pt will improve DGI by at least 8 points in order to demonstrate clinically significant improvement in balance and decreased risk for falls  Baseline: 12 Goal status: INITIAL  3.  Pt will increase by at least 40 ft in order to demonstrate  clinically significant improvement in community ambulation Baseline: 351 ft Goal status: INITIAL  4.  Pt will demonstrate increase in LE strength to 4/5 to facilitate ease and safety in ambulation  Baseline: 3-/5 Goal status: INITIAL  5.  Pt will decrease 5TSTS by at least 3 seconds in order to demonstrate clinically significant improvement in LE strength   Baseline: 13.33 sec Goal status: INITIAL   ASSESSMENT:  CLINICAL IMPRESSION: Patient is a 62 y.o. female who was seen today for physical therapy evaluation and treatment for frequent falls. Patient's condition is further defined by difficulty with walking due to impaired balance/proprioception, weakness, and decreased soft tissue extensibility. Skilled PT is required to address the impairments and functional limitations listed below.    OBJECTIVE IMPAIRMENTS: Abnormal gait, decreased activity tolerance, decreased balance, difficulty walking, decreased strength, and impaired flexibility.   ACTIVITY LIMITATIONS: carrying, lifting, bending, standing, squatting, and stairs  PARTICIPATION LIMITATIONS: meal prep, cleaning, laundry, shopping, community activity, and occupation  PERSONAL FACTORS: 1-2 comorbidities: hx of brain mass removal in 2019, diabetic retinopathy  are also affecting patient's functional  outcome.   REHAB POTENTIAL: Fair    CLINICAL DECISION MAKING: Evolving/moderate complexity  EVALUATION COMPLEXITY: Moderate  PLAN:  PT FREQUENCY: 2x/week  PT DURATION: 4 weeks  PLANNED INTERVENTIONS: 97164- PT Re-evaluation, 97110-Therapeutic exercises, 97530- Therapeutic activity, [id number]- Neuromuscular re-education, 97535- Self Care, 28413- Manual therapy, [phone]- Gait training, Patient/Family education, and Stair training  PLAN FOR NEXT SESSION: Provide HEP. Begin LE strengthening, flexibility, balance, and gait activities with emphasis on ankle strategies.   Tish Frederickson. Sheril Hammond, PT, DPT, OCS Board-Certified Clinical  Specialist in Orthopedic PT PT Compact Privilege # ([place]): [phone] T 12/12/2022, 3:32 PM  [employer] Auth Request  Referring diagnosis code (ICD 10)? R29.6 Treatment diagnosis codes (ICD 10)? (if different than referring diagnosis) R26.2, M62.81, R26.89 What was this (referring dx) caused by? []  Surgery [x]  Fall []  Ongoing issue []  Arthritis []  Other: ____________  Laterality: []  Rt []  Lt [x]  Both  Deficits: []  Pain []  Stiffness [x]  Weakness []  Edema [x]  Balance Deficits []  Coordination [x]  Gait Disturbance []  ROM []  Other   Functional Tool Score: ABC scale score = 36.9%, Dynamic Gait Index =12  CPT codes: See Planned Interventions listed in the Plan section of the Evaluation.

## 2022-12-17 ENCOUNTER — Ambulatory Visit (HOSPITAL_COMMUNITY): Payer: Medicare PPO

## 2022-12-17 ENCOUNTER — Encounter (HOSPITAL_COMMUNITY): Payer: Self-pay

## 2022-12-17 DIAGNOSIS — R293 Abnormal posture: Secondary | ICD-10-CM

## 2022-12-17 DIAGNOSIS — R262 Difficulty in walking, not elsewhere classified: Secondary | ICD-10-CM

## 2022-12-17 DIAGNOSIS — M6281 Muscle weakness (generalized): Secondary | ICD-10-CM

## 2022-12-17 DIAGNOSIS — M5459 Other low back pain: Secondary | ICD-10-CM

## 2022-12-17 DIAGNOSIS — R2689 Other abnormalities of gait and mobility: Secondary | ICD-10-CM

## 2022-12-17 NOTE — Therapy (Signed)
OUTPATIENT PHYSICAL THERAPY NEURO TREATMENT   Patient Name: Charlene [name] MRN: [id number] DOB:1961-01-07, 62 y.o., female Today's Date: 12/17/2022   PCP: Barbette Reichmann, MD REFERRING PROVIDER: Morene Crocker, MD  END OF SESSION:  PT End of Session - 12/17/22 0853     Visit Number 2    Number of Visits 8    Date for PT Re-Evaluation 01/09/23    Authorization Type [employer] (requested 8 visits)    Authorization Time Period cohere approved 8 visits from 11/22-->01/20/23    Authorization - Visit Number 1    Authorization - Number of Visits 8    Progress Note Due on Visit 8    PT Start Time 0854   late sign in   PT Stop Time 0928    PT Time Calculation (min) 34 min            Past Medical History:  Diagnosis Date   Arthritis    knees   Degenerative disc disease, lumbar    Diabetes mellitus without complication (HCC)    GERD (gastroesophageal reflux disease)    Hypertension    Neuropathy    left lower leg   Polyneuropathy    Polyneuropathy    Past Surgical History:  Procedure Laterality Date   ABDOMINAL HYSTERECTOMY     arthroscopic rotator cuff     CATARACT EXTRACTION W/PHACO Right 11/25/2017   Procedure: CATARACT EXTRACTION PHACO AND INTRAOCULAR LENS PLACEMENT (IOC) RIGHT DIABETIC;  Surgeon: Lockie Mola, MD;  Location: [facility];  Service: Ophthalmology;  Laterality: Right;  Diabetic - insulin and oral meds   COLONOSCOPY     endosopic sinus     EYE SURGERY     Patient Active Problem List   Diagnosis Date Noted   Poor balance 09/25/2022   Lumbar facet joint pain 05/29/2022   Grade 1 Retrolisthesis of L2/L3 05/13/2022   Pain of patellofemoral joint (Left) 03/18/2022   Chronic knee pain (Left) 12/10/2021    Class: Chronic   Osteoarthritis of knee (Left) 12/10/2021    Class: Chronic   Abnormal x-ray of lumbar spine (03/16/2021) 10/30/2021   Osteopenia determined by x-ray 10/30/2021   Closed compression fracture of L3 lumbar  vertebra, sequela 10/30/2021   History of compression fracture of vertebral column 10/30/2021   Spinal column pain 10/30/2021   Chronic sacroiliac joint pain (Right) 03/11/2021   Diabetic retinopathy (HCC) 12/17/2020   Hypertensive retinopathy of both eyes 12/17/2020   NPDR (nonproliferative diabetic retinopathy) (HCC) 12/17/2020   Pseudophakia of right eye 12/17/2020   Disease of thyroid gland 12/17/2020   Abnormal drug screen (09/03/2020) 12/06/2020   Pain medication agreement broken (12/06/2020) 12/06/2020   History of cocaine use (09/03/2020) 12/06/2020   History of illicit drug use (09/03/2020) 12/06/2020   Substance use disorder 12/06/2020   Spondylosis without myelopathy or radiculopathy, lumbosacral region 10/09/2020   Chronic low back pain (Bilateral) w/o sciatica 10/09/2020   Chronic ankle and foot pain (1ry area of Pain) (Left) 09/03/2020   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) 09/03/2020   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) 09/03/2020   Chronic use of opiate for therapeutic purpose 07/08/2020   Pain medication agreement signed 11/08/2019   Meningioma (HCC) 11/01/2019   Hx of resection of meningioma 09/14/2019   Osteoporosis 08/05/2019   Neck pain 03/11/2019   Knee pain 11/18/2018   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) 10/06/2018   Osteoarthritis involving multiple joints 08/04/2018   Type 2 diabetes  mellitus with both eyes affected by mild nonproliferative retinopathy without macular edema, with long-term current use of insulin (HCC) 03/22/2018   Abnormal EMG (07/06/2017) 11/23/2017   Long-term insulin use (HCC) 11/10/2017   Uncontrolled type 2 diabetes mellitus with hyperglycemia (HCC) 11/10/2017   Vitamin D deficiency 10/21/2017   DM type 2 with diabetic peripheral neuropathy (HCC) 10/21/2017   DDD (degenerative disc disease), lumbar 10/21/2017   Lumbar facet arthropathy 10/21/2017   Lumbar facet syndrome 10/21/2017    Lumbar facet hypertrophy 10/21/2017   Lumbar foraminal stenosis (L5-S1) (Right) 10/21/2017   Osteoarthritis of knee (Bilateral) 10/21/2017   Chronic musculoskeletal pain 10/21/2017   Arthropathy of lumbar facet joint 10/21/2017   Essential hypertension 10/12/2017   Chronic ankle pain (Left) 10/12/2017   Chronic foot pain (Left) 10/12/2017   Chronic lower extremity pain (2ry area of Pain) (Bilateral) (L>R) 10/12/2017   Chronic knee pain (4th area of Pain) (Bilateral) (L>R) 10/12/2017   Chronic low back pain (3ry area of Pain) (Bilateral) (L>R) w/ sciatica (Left) 10/12/2017   Chronic pain syndrome 10/12/2017   Opiate use 10/12/2017   Pharmacologic therapy 10/12/2017   Disorder of skeletal system 10/12/2017   Problems influencing health status 10/12/2017   Chronic pain disorder 10/12/2017   Neuropathy 08/11/2017   Burning sensation of feet 07/06/2017   Lower extremity numbness and tingling (Left) 07/06/2017   Left leg weakness 07/06/2017   Pain in both lower extremities 06/24/2017   Polyneuropathy associated with underlying disease (HCC) 05/29/2017   DM type 2, uncontrolled, with neuropathy 05/05/2017   Back pain with left-sided sciatica 10/16/2015   Meralgia paraesthetica, left 10/16/2015   Neuropathic pain 06/29/2015   GERD without esophagitis 01/16/2014   Diabetes mellitus (HCC) 01/21/2003    ONSET DATE: A year ago  REFERRING DIAG: R29.6 (ICD-10-CM) - Frequent falls  THERAPY DIAG:  Difficulty in walking, not elsewhere classified  Muscle weakness (generalized)  Other abnormalities of gait and mobility  Other low back pain  Posture abnormality  Rationale for Evaluation and Treatment: Rehabilitation  SUBJECTIVE:                                                                                                                                                                                             SUBJECTIVE STATEMENT: 12/17/22:  Pt reports she has some intermittent  dull achy pain Lt knee pain scale 2/10.  No reports of recent fall since last session.  EVAL: Arrives to the clinic due to frequent falls and unsteadiness when walking and doing stairs. Patient states that she can be standing on a sink and then she would have a tendency  to lean back. At times, patient has a hard time catching herself. When walking a lot, patient's L knee can give way at times. Denies any dizziness and numbness to the LE. Condition started about a year ago without reason which gradually got progressively worse. 4 months ago, patient went to her MD. Patient was given exercises by her MD for the balance but claims that she's not doing it. Per patient, no ancillary procedures were done to further examine the issue. Patient had a hx of brain mass removal in 2019. Patient then had a virtual visit and then referred her to outpatient PT evaluation and management. Pt accompanied by: self  PERTINENT HISTORY: hx of brain mass removal in 2019, diabetic retinopath, low back pain  PAIN:  Are you having pain? No  PRECAUTIONS: Fall  RED FLAGS: Bowel or bladder incontinence: No, Spinal tumors: No, Cauda equina syndrome: No, and Compression fracture: No   WEIGHT BEARING RESTRICTIONS: No  FALLS: Has patient fallen in last 6 months? Yes. Number of falls 3-4  LIVING ENVIRONMENT: Lives with: lives with their family Lives in: House/apartment Stairs: Yes: External: 2 steps; bilateral but cannot reach both Has following equipment at home: Single point cane, Walker - 4 wheeled, and shower chair  PLOF: Independent and Independent with basic ADLs  PATIENT GOALS: "to have better strength with my legs and keep my balance"  OBJECTIVE:  Note: Objective measures were completed at Evaluation unless otherwise noted.  DIAGNOSTIC FINDINGS: None performed to date  COGNITION: Overall cognitive status: Within functional limits for tasks assessed   SENSATION: WFL  COORDINATION: Alternating toe taps:  intact, WFL Heel-to-shin: intact, WFL  MUSCLE LENGTH: Hamstrings: mild restriction on B Gastrocnemius: moderate restriction on L, mild restriction on R  POSTURE: rounded shoulders and forward head  LOWER EXTREMITY ROM:  B hip flex/abd, B knee flex/ext, B ankle DF/PF are [facility] when done actively  LOWER EXTREMITY MMT:    MMT Right Eval Left Eval  Hip flexion 4 3+  Hip extension    Hip abduction 4 4  Hip adduction    Hip internal rotation    Hip external rotation    Knee flexion 4+ 4+  Knee extension 4 4  Ankle dorsiflexion 4 3-  Ankle plantarflexion 4 4-  Ankle inversion    Ankle eversion    (Blank rows = not tested)   TRANSFERS: Assistive device utilized: None  Sit to stand: Complete Independence Stand to sit: Complete Independence  STAIRS: Level of Assistance: Complete Independence Stair Negotiation Technique:  Bilateral Rails Number of Stairs: 4  Height of Stairs: 7"  Comments: step-to pattern when ascending, alternating pattern when descending, handrails on both instances  GAIT: Gait pattern: decreased step length- Right, decreased step length- Left, decreased stance time- Right, decreased stance time- Left, decreased stride length, trendelenburg, lateral lean- Right, lateral lean- Left, wide BOS, poor foot clearance- Right, and poor foot clearance- Left Distance walked: 351 ft Assistive device utilized: None Level of assistance: Complete Independence Comments: done during  FUNCTIONAL TESTS:  5 times sit to stand: 13.33 sec 2 minute walk test: 351 ft Dynamic Gait Index: 12  DGI 12/12/22 1. Gait level surface (2) Mild Impairment: Walks 20', uses assistive devices, slower speed, mild gait deviations. 2. Change in gait speed (2) Mild Impairment: Is able to change speed but demonstrates mild gait deviations, or not gait deviations but unable to achieve a significant change in velocity, or uses an assistive device. 3. Gait with horizontal head turns (  1)  Moderate Impairment: Performs head turns with moderate change in gait velocity, slows down, staggers but recovers, can continue to walk. 4. Gait with vertical head turns 1) Moderate Impairment: Performs head turns with moderate change in gait velocity, slows down, staggers but recovers, can continue to walk. 5. Gait and pivot turn (2) Mild Impairment: Pivot turns safely in > 3 seconds and stops with no loss of balance. 6. Step over obstacle (1) Moderate Impairment: Is able to step over box but must stop, then step over. May require verbal cueing. 7. Step around obstacles (2) Mild Impairment: Is able to step around both cones, but must slow down and adjust steps to clear cones. 8. Stairs (1) Moderate Impairment: Two feet to a stair, must use rail.  TOTAL SCORE: 12 / 24   PATIENT SURVEYS:  ABC scale 590/1600 = 36.9%  TODAY'S TREATMENT:                                                                                                                              DATE:  12/17/22: Reviewed goals Educated importance of HEP compliance  Supine: Bridges 1 hand chest/ 1 hand stomach deep breathing x 1 min TrA activation paired with exhalation x 1 min Marching with ab set  Sidelying:  Clam with RTB 10x 5" holds  Sit to stand no HHA eccentric control x 10  12/12/22 Evaluation and patient education done    PATIENT EDUCATION: Education details: Educated on the pathoanatomy of balance deficits. Educated on the goals and course of rehab. Educated on measures to lower risk of falls at home. Education of possible use of cane on the R. Person educated: Patient Education method: Explanation Education comprehension: verbalized understanding  HOME EXERCISE PROGRAM: Access Code: [email or web address] URL: [email or web address] Date: 12/17/2022 Prepared by: Becky Sax  Exercises - Supine Bridge  - 2 x daily - 7 x weekly - 2 sets - 10 reps - 5" hold - Supine Transversus Abdominis  Bracing - Hands on Stomach  - 2 x daily - 7 x weekly - 2 sets - 10 reps - 5" hold - Clam with Resistance  - 2 x daily - 7 x weekly - 2 sets - 10 reps - 5" hold - Sit to Stand  - 2 x daily - 7 x weekly - 2 sets - 10 reps  GOALS: Goals reviewed with patient? Yes  SHORT TERM GOALS: Target date: 12/26/22  Pt will demonstrate indep in HEP to facilitate carry-over of skilled services and improve functional outcomes Goal status: INITIAL  LONG TERM GOALS: Target date: 01/09/23  Pt will improve ABC scale score to 70% in order to demonstrate improved confidence with balance and safety during ambulation and other ADLs  Baseline: 36.9% Goal status: INITIAL  2.  Pt will improve DGI by at least 8 points in order to demonstrate clinically significant improvement in balance and decreased risk for falls  Baseline: 12 Goal status: INITIAL  3.  Pt will increase by at least 40 ft in order to demonstrate clinically significant improvement in community ambulation Baseline: 351 ft Goal status: INITIAL  4.  Pt will demonstrate increase in LE strength to 4/5 to facilitate ease and safety in ambulation  Baseline: 3-/5 Goal status: INITIAL  5.  Pt will decrease 5TSTS by at least 3 seconds in order to demonstrate clinically significant improvement in LE strength   Baseline: 13.33 sec Goal status: INITIAL   ASSESSMENT:  CLINICAL IMPRESSION: Reviewed goals and educated importance of HEP compliance for maximal benefits which was established this session.  Session focus on core and proximal musculature strengthening.  Pt able to complete all exercises with good form following minimal cueing for form and mechanics, main cue with breathing to reduce holding breath during abdominal sets.  Pt given exercise printout and verbalized understanding.  No reports of pain through session.  Eval:  Patient is a 62 y.o. female who was seen today for physical therapy evaluation and treatment for frequent falls.  Patient's condition is further defined by difficulty with walking due to impaired balance/proprioception, weakness, and decreased soft tissue extensibility. Skilled PT is required to address the impairments and functional limitations listed below.    OBJECTIVE IMPAIRMENTS: Abnormal gait, decreased activity tolerance, decreased balance, difficulty walking, decreased strength, and impaired flexibility.   ACTIVITY LIMITATIONS: carrying, lifting, bending, standing, squatting, and stairs  PARTICIPATION LIMITATIONS: meal prep, cleaning, laundry, shopping, community activity, and occupation  PERSONAL FACTORS: 1-2 comorbidities: hx of brain mass removal in 2019, diabetic retinopathy  are also affecting patient's functional outcome.   REHAB POTENTIAL: Fair    CLINICAL DECISION MAKING: Evolving/moderate complexity  EVALUATION COMPLEXITY: Moderate  PLAN:  PT FREQUENCY: 2x/week  PT DURATION: 4 weeks  PLANNED INTERVENTIONS: 97164- PT Re-evaluation, 97110-Therapeutic exercises, 97530- Therapeutic activity, [id number]- Neuromuscular re-education, 97535- Self Care, 16109- Manual therapy, [phone]- Gait training, Patient/Family education, and Stair training  PLAN FOR NEXT SESSION: Provide HEP. Begin LE strengthening, flexibility, balance, and gait activities with emphasis on ankle strategies.  Becky Sax, LPTA/CLT; CBIS [phone]  Juel Burrow, PTA 12/17/2022, 3:24 PM  12/17/2022, 3:24 PM

## 2022-12-23 ENCOUNTER — Telehealth (HOSPITAL_COMMUNITY): Payer: Self-pay

## 2022-12-23 ENCOUNTER — Encounter (HOSPITAL_COMMUNITY): Payer: Medicare PPO

## 2022-12-23 NOTE — Telephone Encounter (Signed)
No show, called with no answer and mailbox full so unable to leave message regarding missed apt today. ? ?Ihor Austin, LPTA/CLT; CBIS ?[phone] ? ?

## 2022-12-25 ENCOUNTER — Encounter (HOSPITAL_COMMUNITY): Payer: Medicare PPO

## 2022-12-25 ENCOUNTER — Encounter (HOSPITAL_COMMUNITY): Payer: Self-pay

## 2022-12-25 NOTE — Therapy (Signed)
[facility] Outpatient Rehabilitation at [facility] [address] [place], [place], [address] Phone: [phone]   Fax:  [phone]  Patient Details  Name: Charlene [name] MRN: [id number] Date of Birth: 1960-11-19 Referring Provider:  No ref. provider found  Encounter Date: 12/25/2022  Called pt's alternate contact, mailbox full and received pt's voicemail.  Pt's sister in law pickup. I asked pt's SIL to inform pt this is #2 no show. And informed pt's alternate contact of no show policy.   Nelida Meuse, PT 12/25/2022, 12:22 PM  [facility] Outpatient Rehabilitation at [facility] [address] [place], [place], [address] Phone: [phone]   Fax:  [phone]

## 2022-12-29 ENCOUNTER — Encounter (HOSPITAL_COMMUNITY): Payer: Self-pay

## 2022-12-29 ENCOUNTER — Encounter (HOSPITAL_COMMUNITY): Payer: Medicare PPO

## 2022-12-29 NOTE — Therapy (Signed)
[facility] Outpatient Rehabilitation at [facility] [address] [place], [place], [address] Phone: [phone]   Fax:  [phone]  Patient Details  Name: Charlene [name] MRN: [id number] Date of Birth: 26-Jun-1960 Referring Provider:  No ref. provider found  Encounter Date: 12/29/2022  Called pt regarding #3 no show. Pt understood policy and agrees to discharge.   PHYSICAL THERAPY DISCHARGE SUMMARY  Visits from Start of Care: 2  Current functional level related to goals / functional outcomes: Unknown   Remaining deficits: Unknown   Education / Equipment: NA   Patient agrees to discharge. Patient goals were not met. Patient is being discharged due to not returning since the last visit.  Nelida Meuse, PT 12/29/2022, 11:33 AM  [facility] Outpatient Rehabilitation at [facility] [address] [place], [place], [address] Phone: [phone]   Fax:  [phone]

## 2022-12-31 ENCOUNTER — Encounter (HOSPITAL_COMMUNITY): Payer: Medicare PPO

## 2023-01-06 ENCOUNTER — Encounter (HOSPITAL_COMMUNITY): Payer: Medicare PPO

## 2023-01-08 ENCOUNTER — Encounter (HOSPITAL_COMMUNITY): Payer: Medicare PPO

## 2023-01-12 ENCOUNTER — Encounter (HOSPITAL_COMMUNITY): Payer: Medicare PPO

## 2023-01-16 ENCOUNTER — Encounter (HOSPITAL_COMMUNITY): Payer: Medicare PPO

## 2023-01-18 ENCOUNTER — Emergency Department: Payer: Medicare PPO

## 2023-01-18 ENCOUNTER — Other Ambulatory Visit: Payer: Self-pay

## 2023-01-18 ENCOUNTER — Emergency Department
Admission: EM | Admit: 2023-01-18 | Discharge: 2023-01-18 | Discharge status: Against Medical Advice | Payer: Medicare PPO | Attending: Emergency Medicine | Admitting: Emergency Medicine

## 2023-01-18 DIAGNOSIS — I10 Essential (primary) hypertension: Secondary | ICD-10-CM | POA: Insufficient documentation

## 2023-01-18 DIAGNOSIS — R1110 Vomiting, unspecified: Secondary | ICD-10-CM | POA: Diagnosis not present

## 2023-01-18 DIAGNOSIS — E119 Type 2 diabetes mellitus without complications: Secondary | ICD-10-CM | POA: Diagnosis not present

## 2023-01-18 DIAGNOSIS — Z5321 Procedure and treatment not carried out due to patient leaving prior to being seen by health care provider: Secondary | ICD-10-CM | POA: Diagnosis not present

## 2023-01-18 DIAGNOSIS — R109 Unspecified abdominal pain: Secondary | ICD-10-CM | POA: Diagnosis not present

## 2023-01-18 DIAGNOSIS — R531 Weakness: Secondary | ICD-10-CM | POA: Insufficient documentation

## 2023-01-18 LAB — CBC WITH DIFFERENTIAL/PLATELET
Abs Immature Granulocytes: 0.04 10*3/uL (ref 0.00–0.07)
Basophils Absolute: 0.1 10*3/uL (ref 0.0–0.1)
Basophils Relative: 0 %
Eosinophils Absolute: 0.6 10*3/uL — ABNORMAL HIGH (ref 0.0–0.5)
Eosinophils Relative: 5 %
HCT: 34.3 % — ABNORMAL LOW (ref 36.0–46.0)
Hemoglobin: 10.6 g/dL — ABNORMAL LOW (ref 12.0–15.0)
Immature Granulocytes: 0 %
Lymphocytes Relative: 12 %
Lymphs Abs: 1.4 10*3/uL (ref 0.7–4.0)
MCH: 27.2 pg (ref 26.0–34.0)
MCHC: 30.9 g/dL (ref 30.0–36.0)
MCV: 87.9 fL (ref 80.0–100.0)
Monocytes Absolute: 0.9 10*3/uL (ref 0.1–1.0)
Monocytes Relative: 8 %
Neutro Abs: 9.1 10*3/uL — ABNORMAL HIGH (ref 1.7–7.7)
Neutrophils Relative %: 75 %
Platelets: 311 10*3/uL (ref 150–400)
RBC: 3.9 MIL/uL (ref 3.87–5.11)
RDW: 14.5 % (ref 11.5–15.5)
WBC: 12 10*3/uL — ABNORMAL HIGH (ref 4.0–10.5)
nRBC: 0 % (ref 0.0–0.2)

## 2023-01-18 LAB — COMPREHENSIVE METABOLIC PANEL
ALT: 21 U/L (ref 0–44)
AST: 29 U/L (ref 15–41)
Albumin: 3.9 g/dL (ref 3.5–5.0)
Alkaline Phosphatase: 94 U/L (ref 38–126)
Anion gap: 13 (ref 5–15)
BUN: 38 mg/dL — ABNORMAL HIGH (ref 6–20)
CO2: 23 mmol/L (ref 22–32)
Calcium: 9.1 mg/dL (ref 8.9–10.3)
Chloride: 100 mmol/L (ref 98–111)
Creatinine, Ser: 2.1 mg/dL — ABNORMAL HIGH (ref 0.44–1.00)
GFR, Estimated: 26 mL/min — ABNORMAL LOW (ref >60)
Glucose, Bld: 114 mg/dL — ABNORMAL HIGH (ref 70–99)
Potassium: 4.6 mmol/L (ref 3.5–5.1)
Sodium: 136 mmol/L (ref 135–145)
Total Bilirubin: 0.3 mg/dL (ref <1.2)
Total Protein: 7.8 g/dL (ref 6.5–8.1)

## 2023-01-18 LAB — CBG MONITORING, ED: Glucose-Capillary: 95 mg/dL (ref 70–99)

## 2023-01-18 LAB — TROPONIN I (HIGH SENSITIVITY): Troponin I (High Sensitivity): 6 ng/L (ref <18)

## 2023-01-18 LAB — LIPASE, BLOOD: Lipase: 27 U/L (ref 11–51)

## 2023-01-18 NOTE — ED Triage Notes (Signed)
Pt c/o weakness and vomiting that started when she woke up this morning. Pt says she felt normal when she went to bed last night. Pt reports 3 episodes of emesis this morning with associated abdominal pain. Pt has hx of IDDM. Pt reports compliance with medications. Glucose 95 in triage. Pt 89% SpO2 on RA, but no outward signs of difficulty breathing.

## 2023-01-18 NOTE — ED Provider Triage Note (Signed)
Emergency Medicine Provider Triage Evaluation Note  Charlene [name] , a 62 y.o. female  was evaluated in triage.  Pt complains of weakness and vomiting since this morning.  No shaded symptoms include shortness of breath and chest pain.  Patient has history of hypertension and diabetes.  Review of Systems  Positive:  Negative:   Physical Exam  SpO2 (!) 88%  Gen:   Lethargic appearing, able to respond to questions verbally. In no distress.    Resp:  Normal effort LCTAB MSK:   Moves extremities without difficulty  Other:    Medical Decision Making  Medically screening exam initiated at 1:41 PM.  Appropriate orders placed.  Charlene [name] was informed that the remainder of the evaluation will be completed by another provider, this initial triage assessment does not replace that evaluation, and the importance of remaining in the ED until their evaluation is complete.     Romeo Apple, Jawanda Passey A, PA-C 01/18/23 1347

## 2023-01-18 NOTE — ED Triage Notes (Signed)
Arrives from home via [facility].  Family called EMS because patient work up with tremors at 1030 and nausea.  Hx DM, brain tumor removed 4-5 years ago.  VS wnl. CBG:  105.  Sleepy per EMS, leaning to right.  Family on way to hospital.

## 2023-02-28 NOTE — Progress Notes (Deleted)
 PCP: Barbette Reichmann, MD   No chief complaint on file.   HPI:      [name] is a 63 y.o. No obstetric history on file. whose LMP was No LMP recorded. Patient has had a hysterectomy., presents today for her NP annual examination.  Her menses are {norm/abn:715}, lasting {number: 22536} days.  Dysmenorrhea {dysmen:716}. She {does:18564} have intermenstrual bleeding. S/p TAH**** She {does:18564} have vasomotor sx.   Sex activity: {sex active: 315163}. She {does:18564} have vaginal dryness.  Last Pap: Results were: {norm/abn:16707::"no abnormalities"} /neg HPV DNA.  Hx of STDs: {STD hx:14358}  Last mammogram: 07/01/22 Results were: normal--routine follow-up in 12 months There is no FH of breast cancer. There is no FH of ovarian cancer. The patient {does:18564} do self-breast exams.  Colonoscopy: {hx:15363}  Repeat due after 10*** years.   Tobacco use: {tob:20664} Alcohol use: {Alcohol:11675} No drug use Exercise: {exercise:31265}  She {does:18564} get adequate calcium and Vitamin D in her diet.  Labs with PCP.   Patient Active Problem List   Diagnosis Date Noted   Poor balance 09/25/2022   Lumbar facet joint pain 05/29/2022   Grade 1 Retrolisthesis of L2/L3 05/13/2022   Pain of patellofemoral joint (Left) 03/18/2022   Chronic knee pain (Left) 12/10/2021   Osteoarthritis of knee (Left) 12/10/2021   Abnormal x-ray of lumbar spine (03/16/2021) 10/30/2021   Osteopenia determined by x-ray 10/30/2021   Closed compression fracture of L3 lumbar vertebra, sequela 10/30/2021   History of compression fracture of vertebral column 10/30/2021   Spinal column pain 10/30/2021   Chronic sacroiliac joint pain (Right) 03/11/2021   Diabetic retinopathy (HCC) 12/17/2020   Hypertensive retinopathy of both eyes 12/17/2020   NPDR (nonproliferative diabetic retinopathy) (HCC) 12/17/2020   Pseudophakia of right eye 12/17/2020   Disease of thyroid gland 12/17/2020   Abnormal drug  screen (09/03/2020) 12/06/2020   Pain medication agreement broken (12/06/2020) 12/06/2020   History of cocaine use (09/03/2020) 12/06/2020   History of illicit drug use (09/03/2020) 12/06/2020   Substance use disorder 12/06/2020   Spondylosis without myelopathy or radiculopathy, lumbosacral region 10/09/2020   Chronic low back pain (Bilateral) w/o sciatica 10/09/2020   Chronic ankle and foot pain (1ry area of Pain) (Left) 09/03/2020   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) 09/03/2020   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) 09/03/2020   Chronic use of opiate for therapeutic purpose 07/08/2020   Pain medication agreement signed 11/08/2019   Meningioma (HCC) 11/01/2019   Hx of resection of meningioma 09/14/2019   Osteoporosis 08/05/2019   Neck pain 03/11/2019   Knee pain 11/18/2018   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) 10/06/2018   Osteoarthritis involving multiple joints 08/04/2018   Type 2 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, with long-term current use of insulin (HCC) 03/22/2018   Abnormal EMG (07/06/2017) 11/23/2017   Long-term insulin use (HCC) 11/10/2017   Uncontrolled type 2 diabetes mellitus with hyperglycemia (HCC) 11/10/2017   Vitamin D deficiency 10/21/2017   DM type 2 with diabetic peripheral neuropathy (HCC) 10/21/2017   DDD (degenerative disc disease), lumbar 10/21/2017   Lumbar facet arthropathy 10/21/2017   Lumbar facet syndrome 10/21/2017   Lumbar facet hypertrophy 10/21/2017   Lumbar foraminal stenosis (L5-S1) (Right) 10/21/2017   Osteoarthritis of knee (Bilateral) 10/21/2017   Chronic musculoskeletal pain 10/21/2017   Arthropathy of lumbar facet joint 10/21/2017   Essential hypertension 10/12/2017   Chronic ankle pain (Left) 10/12/2017  Chronic foot pain (Left) 10/12/2017   Chronic lower extremity pain (2ry area of Pain) (Bilateral) (L>R) 10/12/2017    Chronic knee pain (4th area of Pain) (Bilateral) (L>R) 10/12/2017   Chronic low back pain (3ry area of Pain) (Bilateral) (L>R) w/ sciatica (Left) 10/12/2017   Chronic pain syndrome 10/12/2017   Opiate use 10/12/2017   Pharmacologic therapy 10/12/2017   Disorder of skeletal system 10/12/2017   Problems influencing health status 10/12/2017   Chronic pain disorder 10/12/2017   Neuropathy 08/11/2017   Burning sensation of feet 07/06/2017   Lower extremity numbness and tingling (Left) 07/06/2017   Left leg weakness 07/06/2017   Pain in both lower extremities 06/24/2017   Polyneuropathy associated with underlying disease (HCC) 05/29/2017   DM type 2, uncontrolled, with neuropathy 05/05/2017   Back pain with left-sided sciatica 10/16/2015   Meralgia paraesthetica, left 10/16/2015   Neuropathic pain 06/29/2015   GERD without esophagitis 01/16/2014   Diabetes mellitus (HCC) 01/21/2003    Past Surgical History:  Procedure Laterality Date   ABDOMINAL HYSTERECTOMY     arthroscopic rotator cuff     CATARACT EXTRACTION W/PHACO Right 11/25/2017   Procedure: CATARACT EXTRACTION PHACO AND INTRAOCULAR LENS PLACEMENT (IOC) RIGHT DIABETIC;  Surgeon: Lockie Mola, MD;  Location: [facility];  Service: Ophthalmology;  Laterality: Right;  Diabetic - insulin and oral meds   COLONOSCOPY     endosopic sinus     EYE SURGERY      Family History  Problem Relation Age of Onset   Breast cancer Maternal Aunt    Cancer Father    Alcohol abuse Father     Social History   Socioeconomic History   Marital status: Widowed    Spouse name: Not on file   Number of children: Not on file   Years of education: Not on file   Highest education level: Not on file  Occupational History   Not on file  Tobacco Use   Smoking status: Never   Smokeless tobacco: Never  Vaping Use   Vaping status: Never Used  Substance and Sexual Activity   Alcohol use: Not Currently    Alcohol/week: 0.0 - 1.0  standard drinks of alcohol   Drug use: No   Sexual activity: Not on file  Other Topics Concern   Not on file  Social History Narrative   Not on file   Social Drivers of Health   Financial Resource Strain: Medium Risk (01/19/2023)   Received from [facility]   Overall Financial Resource Strain (CARDIA)    Difficulty of Paying Living Expenses: Somewhat hard  Food Insecurity: Food Insecurity Present (01/19/2023)   Received from [facility]   Hunger Vital Sign    Worried About Running Out of Food in the Last Year: Sometimes true    Ran Out of Food in the Last Year: Sometimes true  Transportation Needs: No Transportation Needs (01/19/2023)   Received from [facility]    In the past 12 months, has lack of transportation kept you from medical appointments or from getting medications?: No    Lack of Transportation (Non-Medical): No  Physical Activity: Inactive (10/06/2022)   Received from [facility]   Exercise Vital Sign    Days of Exercise per Week: 0 days    Minutes of Exercise per Session: 0 min  Stress: No Stress Concern Present (10/06/2022)   Received from [facility]   [place]  [employer] - Occupational Stress Questionnaire    Feeling of Stress : Only a little  Social Connections: Moderately Integrated (10/06/2022)   Received from [facility]   Social Connection and Isolation Panel [NHANES]    Frequency of Communication with Friends and Family: More than three times a week    Frequency of Social Gatherings with Friends and Family: Three times a week    Attends Religious Services: More than 4 times per year    Active Member of Clubs or Organizations: No    Attends Banker Meetings: More than 4 times per year    Marital Status: Widowed  Intimate Partner Violence: Not on file     Current Outpatient Medications:     amLODipine (NORVASC) 10 MG tablet, Take 1 tablet by mouth daily., Disp: , Rfl:    baclofen (LIORESAL) 10 MG tablet, Take 1 tablet (10 mg total) by mouth 3 (three) times daily., Disp: 90 tablet, Rfl: 5   carvedilol (COREG) 25 MG tablet, Take 25 mg by mouth 2 (two) times daily with a meal. , Disp: , Rfl:    Cholecalciferol 125 MCG (5000 UT) capsule, Take by mouth., Disp: , Rfl:    Continuous Blood Gluc Sensor (FREESTYLE LIBRE SENSOR SYSTEM) MISC, 1 kit., Disp: , Rfl:    dicyclomine (BENTYL) 20 MG tablet, Take 20 mg by mouth 4 (four) times daily., Disp: , Rfl:    DULoxetine (CYMBALTA) 30 MG capsule, TAKE 1 TABLET BY MOUTH TWICE DAILY, Disp: , Rfl:    hydrALAZINE (APRESOLINE) 25 MG tablet, Take 25 mg by mouth as needed. Take when BP is elevated, Disp: , Rfl:    Insulin Glargine, 2 Unit Dial, (TOUJEO MAX SOLOSTAR) 300 UNIT/ML SOPN, Inject 140 Units into the skin daily. 40 units AM and 110 units HS (Patient taking differently: Inject 25 Units into the skin at bedtime.), Disp: 1 pen, Rfl: 1   insulin lispro (HUMALOG) 100 UNIT/ML KwikPen, Inject into the skin at bedtime. Sliding scale, Disp: , Rfl:    lisinopril (PRINIVIL,ZESTRIL) 40 MG tablet, Take 40 mg by mouth daily., Disp: , Rfl:    Magnesium 500 MG CAPS, Take 1 capsule (500 mg total) by mouth 2 (two) times daily at 8 am and 10 pm., Disp: 60 capsule, Rfl: 11   metFORMIN (GLUCOPHAGE-XR) 500 MG 24 hr tablet, Take 1,000 mg by mouth 2 (two) times daily., Disp: , Rfl:    naloxone (NARCAN) nasal spray 4 mg/0.1 mL, Place 1 spray into the nose as needed for up to 365 doses (for opioid-induced respiratory depresssion). In case of emergency (overdose), spray once into each nostril. If no response within 3 minutes, repeat application and call 911., Disp: 1 each, Rfl: 0   nortriptyline (PAMELOR) 50 MG capsule, Take 50 mg by mouth 2 (two) times daily., Disp: , Rfl:    pantoprazole (PROTONIX) 40 MG tablet, Take 40 mg by mouth 2 (two) times daily., Disp: , Rfl:     polyethylene glycol (MIRALAX / GLYCOLAX) packet, Take 17 g by mouth daily. Mix one tablespoon with 8oz of your favorite juice or water every day until you are having soft formed stools. Then start taking once daily if you didn't have a stool the day before. (Patient taking differently: Take 17 g by mouth daily as needed. Mix one tablespoon with 8oz of your favorite juice or water every day until you are having soft formed stools. Then start taking once daily if you didn't have a  stool the day before.), Disp: 30 each, Rfl: 0   pravastatin (PRAVACHOL) 10 MG tablet, Take 1 tablet by mouth at bedtime., Disp: , Rfl:    pregabalin (LYRICA) 25 MG capsule, 25 mg daily. , Disp: , Rfl:    sucralfate (CARAFATE) 1 g tablet, 2 (two) times daily., Disp: , Rfl:    traMADol (ULTRAM) 50 MG tablet, Take 1 tablet (50 mg total) by mouth daily. Must last 30 days, Disp: 30 tablet, Rfl: 5     ROS:  Review of Systems BREAST: No symptoms    Objective: There were no vitals taken for this visit.   OBGyn Exam  Results: No results found for this or any previous visit (from the past 24 hours).  Assessment/Plan:  No diagnosis found.   No orders of the defined types were placed in this encounter.           GYN counsel {counseling: 16159}    F/U  No follow-ups on file.  Gerrald Basu B. Anyae Griffith, PA-C 02/28/2023 4:55 PM

## 2023-03-02 ENCOUNTER — Encounter: Payer: Medicare PPO | Admitting: Obstetrics and Gynecology

## 2023-03-02 DIAGNOSIS — Z01419 Encounter for gynecological examination (general) (routine) without abnormal findings: Secondary | ICD-10-CM

## 2023-03-02 DIAGNOSIS — Z1231 Encounter for screening mammogram for malignant neoplasm of breast: Secondary | ICD-10-CM

## 2023-03-02 DIAGNOSIS — Z1211 Encounter for screening for malignant neoplasm of colon: Secondary | ICD-10-CM

## 2023-03-04 ENCOUNTER — Encounter: Payer: Self-pay | Admitting: Obstetrics and Gynecology

## 2023-04-15 ENCOUNTER — Ambulatory Visit (HOSPITAL_BASED_OUTPATIENT_CLINIC_OR_DEPARTMENT_OTHER): Payer: Medicare PPO | Admitting: Pain Medicine

## 2023-04-15 DIAGNOSIS — Z79899 Other long term (current) drug therapy: Secondary | ICD-10-CM

## 2023-04-15 DIAGNOSIS — Z91199 Patient's noncompliance with other medical treatment and regimen due to unspecified reason: Secondary | ICD-10-CM

## 2023-04-15 NOTE — Progress Notes (Signed)
(  04/15/2023) NO-SHOW to medication management encounter.

## 2023-04-15 NOTE — Patient Instructions (Signed)

## 2023-04-20 ENCOUNTER — Encounter: Payer: Medicare PPO | Admitting: Pain Medicine

## 2023-04-29 ENCOUNTER — Encounter: Admitting: Pain Medicine

## 2023-04-30 NOTE — Progress Notes (Unsigned)
 PROVIDER NOTE: Interpretation of information contained herein should be left to medically-trained personnel. Specific patient instructions are provided elsewhere under "Patient Instructions" section of medical record. This document was created in part using AI and STT-dictation technology, any transcriptional errors that may result from this process are unintentional.  Patient: Charlene [name]  Service: E/M   PCP: Barbette Reichmann, MD  DOB: 04-10-1960  DOS: 05/01/2023  Provider: Bettey Costa, NP  MRN: [id number]  Delivery: Face-to-face  Specialty: Interventional Pain Management  Type: Established Patient  Setting: Ambulatory outpatient facility  Specialty designation: 09  Referring Prov.: Barbette Reichmann, MD  Location: Outpatient office facility       HPI  [name], a 63 y.o. year old female, is here today because of her No primary diagnosis found.. Ms. Pech primary complain today is   Pain Assessment: Severity of   is reported as a  /10. Location:    / . Onset:  . Quality:  . Timing:  . Modifying factor(s):  Marland Kitchen Vitals:  vitals were not taken for this visit.  BMI: Estimated body mass index is 30.3 kg/m as calculated from the following:   Height as of 11/06/22: 4\' 11"  (1.499 m).   Weight as of 11/06/22: 150 lb (68 kg). Last encounter: Visit date not found. Last procedure: Visit date not found.  Reason for encounter: medication management.  The patient indicates doing well with current medication regimen no adverse reaction or side effects reported to the medication.  Pharmacotherapy Assessment  Analgesic: Tramadol (Ultram) 50 mg tablet  Monitoring: [place] PMP: PDMP reviewed during this encounter.       Pharmacotherapy: No side-effects or adverse reactions reported. Compliance: No problems identified. Effectiveness: Clinically acceptable.  No notes on file  No results found for: "CBDTHCR" No results found for: "D8THCCBX" No results found for: "D9THCCBX"  UDS:  Summary   Date Value Ref Range Status  02/26/2022 Note  Final    Comment:    ==================================================================== [employer] Select 13 (MW) ==================================================================== Test                             Result       Flag       Units  Drug Present and Declared for Prescription Verification   Tramadol                       4758         EXPECTED   ng/mg creat   O-Desmethyltramadol            >6173        EXPECTED   ng/mg creat   N-Desmethyltramadol            1183         EXPECTED   ng/mg creat    Source of tramadol is a prescription medication. O-desmethyltramadol    and N-desmethyltramadol are expected metabolites of tramadol.  ==================================================================== Test                      Result    Flag   Units      Ref Range   Creatinine              81               mg/dL      >=78 ==================================================================== Declared Medications:  The flagging and interpretation on this  report are based on the  following declared medications.  Unexpected results may arise from  inaccuracies in the declared medications.   **Note: The testing scope of this panel includes these medications:   Tramadol (Ultram)   **Note: The testing scope of this panel does not include the  following reported medications:   Amlodipine (Norvasc)  Baclofen (Lioresal)  Carvedilol (Coreg)  Cholecalciferol  Dicyclomine (Bentyl)  Duloxetine (Cymbalta)  Hydralazine (Apresoline)  Insulin (Toujeo)  Lisinopril (Zestril)  Magnesium  Metformin (Glucophage)  Nortriptyline (Pamelor)  Pantoprazole (Protonix)  Polyethylene Glycol (MiraLAX)  Pravastatin (Pravachol)  Pregabalin (Lyrica)  Sucralfate (Carafate)  Topical Lidocaine (Lidoderm) ==================================================================== For clinical consultation, please call [phone]. ====================================================================       ROS  Constitutional: Denies any fever or chills Gastrointestinal: No reported hemesis, hematochezia, vomiting, or acute GI distress Musculoskeletal: Denies any acute onset joint swelling, redness, loss of ROM, or weakness Neurological: No reported episodes of acute onset apraxia, aphasia, dysarthria, agnosia, amnesia, paralysis, loss of coordination, or loss of consciousness  Medication Review  Cholecalciferol, DULoxetine, FreeStyle Libre Sensor System, Magnesium, amLODipine, baclofen, carvedilol, dicyclomine, hydrALAZINE, insulin glargine (2 Unit Dial), insulin lispro, lisinopril, metFORMIN, nortriptyline, pantoprazole, polyethylene glycol, pravastatin, pregabalin, sucralfate, and traMADol  History Review  Allergy: Ms. Halberg is allergic to semaglutide. Drug: Ms. Roscher  reports no history of drug use. Alcohol:  reports that she does not currently use alcohol. Tobacco:  reports that she has never smoked. She has never used smokeless tobacco. Social: Ms. Surrette  reports that she has never smoked. She has never used smokeless tobacco. She reports that she does not currently use alcohol. She reports that she does not use drugs. Medical:  has a past medical history of Arthritis, Degenerative disc disease, lumbar, Diabetes mellitus without complication (HCC), GERD (gastroesophageal reflux disease), Hypertension, Neuropathy, Polyneuropathy, and Polyneuropathy. Surgical: Ms. Dossantos  has a past surgical history that includes arthroscopic rotator cuff; Colonoscopy; Abdominal hysterectomy; endosopic sinus; Cataract extraction w/PHACO (Right, 11/25/2017); and Eye surgery. Family: family history includes Alcohol abuse in her father; Breast cancer in her maternal aunt; Cancer in her father.  Laboratory Chemistry Profile   Renal Lab Results  Component Value Date   BUN 38 (H) 01/18/2023   CREATININE 2.10 (H)  01/18/2023   BCR 17 10/12/2017   GFRAA >60 12/23/2018   GFRNONAA 26 (L) 01/18/2023    Hepatic Lab Results  Component Value Date   AST 29 01/18/2023   ALT 21 01/18/2023   ALBUMIN 3.9 01/18/2023   ALKPHOS 94 01/18/2023   LIPASE 27 01/18/2023    Electrolytes Lab Results  Component Value Date   NA 136 01/18/2023   K 4.6 01/18/2023   CL 100 01/18/2023   CALCIUM 9.1 01/18/2023   MG 2.0 10/12/2017    Bone Lab Results  Component Value Date   25OHVITD1 17 (L) 10/12/2017   25OHVITD2 <1.0 10/12/2017   25OHVITD3 17 10/12/2017    Inflammation (CRP: Acute Phase) (ESR: Chronic Phase) Lab Results  Component Value Date   CRP 4 10/12/2017   ESRSEDRATE 12 10/12/2017         Note: Above Lab results reviewed.  Recent Imaging Review  DG Chest 2 View CLINICAL DATA:  Weakness, tremors, and nausea  EXAM: CHEST - 2 VIEW  COMPARISON:  None Available.  FINDINGS: Low lung volumes with bronchovascular crowding. Right mid lung linear opacity. Bibasilar patchy opacities. No pleural effusion or pneumothorax. The heart size and mediastinal contours are within normal limits. No acute osseous abnormality.  IMPRESSION: Low lung volumes with bronchovascular crowding. Right mid lung linear opacity and bibasilar patchy opacities, likely atelectasis. Aspiration or pneumonia can be considered in the appropriate clinical setting.  Electronically Signed   By: Agustin Cree M.D.   On: 01/18/2023 14:23 Note: Reviewed        Physical Exam  General appearance: alert, cooperative, oriented, in mild distress, and Well nourished, well developed, and well hydrated. In no apparent acute distress Mental status: Alert, oriented x 3 (person, place, & time)       Respiratory: No evidence of acute respiratory distress Eyes: PERLA Vitals: There were no vitals taken for this visit. BMI: Estimated body mass index is 30.3 kg/m as calculated from the following:   Height as of 11/06/22: 4\' 11"  (1.499 m).    Weight as of 11/06/22: 150 lb (68 kg). Ideal: Patient weight not recorded  Assessment   Diagnosis Status  1. Chronic pain syndrome   2. 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 Chronic ankle and foot pain (1ry area of Pain) (Left)   4. Lumbar facet syndrome   5. Chronic knee pain (4th area of Pain) (Bilateral) (L>R)   6. Chronic lower extremity pain (2ry area of Pain) (Bilateral) (L>R)   7. Chronic low back pain (Bilateral) w/o sciatica   8. 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 Pharmacologic therapy   10. Chronic use of opiate for therapeutic purpose   11. Encounter for medication management   12. Encounter for chronic pain management    Controlled Controlled Controlled   Updated Problems: No problems updated.  Plan of Care  Problem-specific:  Assessment and Plan  [name] has a current medication list which includes the following long-term medication(s): amlodipine, baclofen, carvedilol, cholecalciferol, dicyclomine, duloxetine, hydralazine, toujeo max solostar, insulin lispro, lisinopril, magnesium, nortriptyline, pantoprazole, pravastatin, pregabalin, sucralfate, and tramadol.  Pharmacotherapy (Medications Ordered): No orders of the defined types were placed in this encounter.  Orders:  No orders of the defined types were placed in this encounter.  Follow-up plan:   No follow-ups on file.     Recent Visits No visits were found meeting these conditions. Showing recent visits within past 90 days and meeting all other requirements Future Appointments Date Type Provider Dept  05/01/23 Appointment Bettey Costa, NP [facility]  Showing future appointments within next 90 days and meeting all other requirements  I discussed the assessment and treatment plan with the patient. The patient was provided an opportunity to ask questions and all were answered. The patient agreed with the plan and demonstrated an understanding of  the instructions.  Patient advised to call back or seek an in-person evaluation if the symptoms or condition worsens.  Duration of encounter: 30 minutes.  Total time on encounter, as per AMA guidelines included both the face-to-face and non-face-to-face time personally spent by the physician and/or other qualified health care professional(s) on the day of the encounter (includes time in activities that require the physician or other qualified health care professional and does not include time in activities normally performed by clinical staff). Physician's time may include the following activities when performed: Preparing to see the patient (e.g., pre-charting review of records, searching for previously ordered imaging, lab work, and nerve conduction tests) Review of prior analgesic pharmacotherapies. Reviewing PMP Interpreting ordered tests (e.g., lab work, imaging, nerve conduction tests) Performing post-procedure evaluations, including interpretation of diagnostic procedures Obtaining and/or reviewing separately obtained history Performing a medically  appropriate examination and/or evaluation Counseling and educating the patient/family/caregiver Ordering medications, tests, or procedures Referring and communicating with other health care professionals (when not separately reported) Documenting clinical information in the electronic or other health record Independently interpreting results (not separately reported) and communicating results to the patient/ family/caregiver Care coordination (not separately reported)  Note by: Bettey Costa, NP (TTS and AI technology used. I apologize for any typographical errors that were not detected and corrected.) Date: 05/01/2023; Time: 4:06 PM

## 2023-05-01 ENCOUNTER — Ambulatory Visit (HOSPITAL_BASED_OUTPATIENT_CLINIC_OR_DEPARTMENT_OTHER): Payer: Self-pay | Admitting: Nurse Practitioner

## 2023-05-01 DIAGNOSIS — G8929 Other chronic pain: Secondary | ICD-10-CM

## 2023-05-01 DIAGNOSIS — M4316 Spondylolisthesis, lumbar region: Secondary | ICD-10-CM

## 2023-05-01 DIAGNOSIS — E1142 Type 2 diabetes mellitus with diabetic polyneuropathy: Secondary | ICD-10-CM

## 2023-05-01 DIAGNOSIS — Z79891 Long term (current) use of opiate analgesic: Secondary | ICD-10-CM

## 2023-05-01 DIAGNOSIS — M545 Low back pain, unspecified: Secondary | ICD-10-CM

## 2023-05-01 DIAGNOSIS — Z79899 Other long term (current) drug therapy: Secondary | ICD-10-CM

## 2023-05-01 DIAGNOSIS — M47816 Spondylosis without myelopathy or radiculopathy, lumbar region: Secondary | ICD-10-CM

## 2023-05-01 DIAGNOSIS — G894 Chronic pain syndrome: Secondary | ICD-10-CM

## 2023-05-01 DIAGNOSIS — M25572 Pain in left ankle and joints of left foot: Secondary | ICD-10-CM

## 2023-05-04 NOTE — Progress Notes (Unsigned)
 PCP: Charlene Reichmann, MD   No chief complaint on file.   HPI:      [name] is a 63 y.o. No obstetric history on file. whose LMP was No LMP recorded. Patient has had a hysterectomy., presents today for her NP annual examination.  Her menses are absent due to HYST???? She {does:18564} have vasomotor sx.   Sex activity: {sex active: 315163}. She {does:18564} have vaginal dryness.  Last Pap: {ZOXW:[id number]}  Results were: {norm/abn:16707::"no abnormalities"} /neg HPV DNA.  Hx of STDs: {STD hx:14358}  Last mammogram: 07/01/22  Results were: normal--routine follow-up in 12 months There is no FH of breast cancer. There is no FH of ovarian cancer. The patient {does:18564} do self-breast exams.  Colonoscopy: {hx:15363}  Repeat due after 10*** years.   Tobacco use: {tob:20664} Alcohol use: {Alcohol:11675} No drug use Exercise: {exercise:31265}  She {does:18564} get adequate calcium and Vitamin D in her diet.  Labs with PCP.   Patient Active Problem List   Diagnosis Date Noted   Poor balance 09/25/2022   Lumbar facet joint pain 05/29/2022   Grade 1 Retrolisthesis of L2/L3 05/13/2022   Pain of patellofemoral joint (Left) 03/18/2022   Chronic knee pain (Left) 12/10/2021   Osteoarthritis of knee (Left) 12/10/2021   Abnormal x-ray of lumbar spine (03/16/2021) 10/30/2021   Osteopenia determined by x-ray 10/30/2021   Closed compression fracture of L3 lumbar vertebra, sequela 10/30/2021   History of compression fracture of vertebral column 10/30/2021   Spinal column pain 10/30/2021   Chronic sacroiliac joint pain (Right) 03/11/2021   Diabetic retinopathy (HCC) 12/17/2020   Hypertensive retinopathy of both eyes 12/17/2020   NPDR (nonproliferative diabetic retinopathy) (HCC) 12/17/2020   Pseudophakia of right eye 12/17/2020   Disease of thyroid gland 12/17/2020   Abnormal drug screen (09/03/2020) 12/06/2020   Pain medication agreement broken (12/06/2020) 12/06/2020    History of cocaine use (09/03/2020) 12/06/2020   History of illicit drug use (09/03/2020) 12/06/2020   Substance use disorder 12/06/2020   Spondylosis without myelopathy or radiculopathy, lumbosacral region 10/09/2020   Chronic low back pain (Bilateral) w/o sciatica 10/09/2020   Chronic ankle and foot pain (1ry area of Pain) (Left) 09/03/2020   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) 09/03/2020   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) 09/03/2020   Chronic use of opiate for therapeutic purpose 07/08/2020   Pain medication agreement signed 11/08/2019   Meningioma (HCC) 11/01/2019   Hx of resection of meningioma 09/14/2019   Osteoporosis 08/05/2019   Neck pain 03/11/2019   Knee pain 11/18/2018   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) 10/06/2018   Osteoarthritis involving multiple joints 08/04/2018   Type 2 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, with long-term current use of insulin (HCC) 03/22/2018   Abnormal EMG (07/06/2017) 11/23/2017   Long-term insulin use (HCC) 11/10/2017   Uncontrolled type 2 diabetes mellitus with hyperglycemia (HCC) 11/10/2017   Vitamin D deficiency 10/21/2017   DM type 2 with diabetic peripheral neuropathy (HCC) 10/21/2017   DDD (degenerative disc disease), lumbar 10/21/2017   Lumbar facet arthropathy 10/21/2017   Lumbar facet syndrome 10/21/2017   Lumbar facet hypertrophy 10/21/2017   Lumbar foraminal stenosis (L5-S1) (Right) 10/21/2017   Osteoarthritis of knee (Bilateral) 10/21/2017   Chronic musculoskeletal pain 10/21/2017   Arthropathy of lumbar facet joint 10/21/2017   Essential hypertension 10/12/2017   Chronic ankle pain (Left) 10/12/2017   Chronic foot pain (Left) 10/12/2017  Chronic lower extremity pain (2ry area of Pain) (Bilateral) (L>R) 10/12/2017   Chronic knee pain (4th area of Pain) (Bilateral) (L>R) 10/12/2017   Chronic low back pain (3ry  area of Pain) (Bilateral) (L>R) w/ sciatica (Left) 10/12/2017   Chronic pain syndrome 10/12/2017   Opiate use 10/12/2017   Pharmacologic therapy 10/12/2017   Disorder of skeletal system 10/12/2017   Problems influencing health status 10/12/2017   Chronic pain disorder 10/12/2017   Neuropathy 08/11/2017   Burning sensation of feet 07/06/2017   Lower extremity numbness and tingling (Left) 07/06/2017   Left leg weakness 07/06/2017   Pain in both lower extremities 06/24/2017   Polyneuropathy associated with underlying disease (HCC) 05/29/2017   DM type 2, uncontrolled, with neuropathy 05/05/2017   Back pain with left-sided sciatica 10/16/2015   Meralgia paraesthetica, left 10/16/2015   Neuropathic pain 06/29/2015   GERD without esophagitis 01/16/2014   Diabetes mellitus (HCC) 01/21/2003    Past Surgical History:  Procedure Laterality Date   ABDOMINAL HYSTERECTOMY     arthroscopic rotator cuff     CATARACT EXTRACTION W/PHACO Right 11/25/2017   Procedure: CATARACT EXTRACTION PHACO AND INTRAOCULAR LENS PLACEMENT (IOC) RIGHT DIABETIC;  Surgeon: Lockie Mola, MD;  Location: [facility];  Service: Ophthalmology;  Laterality: Right;  Diabetic - insulin and oral meds   COLONOSCOPY     endosopic sinus     EYE SURGERY      Family History  Problem Relation Age of Onset   Breast cancer Maternal Aunt    Cancer Father    Alcohol abuse Father     Social History   Socioeconomic History   Marital status: Widowed    Spouse name: Not on file   Number of children: Not on file   Years of education: Not on file   Highest education level: Not on file  Occupational History   Not on file  Tobacco Use   Smoking status: Never   Smokeless tobacco: Never  Vaping Use   Vaping status: Never Used  Substance and Sexual Activity   Alcohol use: Not Currently    Alcohol/week: 0.0 - 1.0 standard drinks of alcohol   Drug use: No   Sexual activity: Not on file  Other Topics Concern    Not on file  Social History Narrative   Not on file   Social Drivers of Health   Financial Resource Strain: Low Risk  (05/04/2023)   Received from [facility]   Overall Financial Resource Strain (CARDIA)    Difficulty of Paying Living Expenses: Not hard at all  Food Insecurity: No Food Insecurity (05/04/2023)   Received from [facility]   Hunger Vital Sign    Worried About Running Out of Food in the Last Year: Never true    Ran Out of Food in the Last Year: Never true  Transportation Needs: No Transportation Needs (05/04/2023)   Received from [facility] - Transportation    In the past 12 months, has lack of transportation kept you from medical appointments or from getting medications?: No    Lack of Transportation (Non-Medical): No  Physical Activity: Inactive (10/06/2022)   Received from [facility]   Exercise Vital Sign    Days of Exercise per Week: 0 days    Minutes of Exercise per Session: 0 min  Stress: No Stress Concern Present (10/06/2022)   Received from [facility] -  Occupational Stress Questionnaire    Feeling of Stress : Only a little  Social Connections: Moderately Integrated (10/06/2022)   Received from [facility]   Social Connection and Isolation Panel [NHANES]    Frequency of Communication with Friends and Family: More than three times a week    Frequency of Social Gatherings with Friends and Family: Three times a week    Attends Religious Services: More than 4 times per year    Active Member of Clubs or Organizations: No    Attends Banker Meetings: More than 4 times per year    Marital Status: Widowed  Intimate Partner Violence: Not on file     Current Outpatient Medications:    amLODipine (NORVASC) 10 MG tablet, Take 1 tablet by mouth daily., Disp: , Rfl:    baclofen (LIORESAL) 10 MG  tablet, Take 1 tablet (10 mg total) by mouth 3 (three) times daily., Disp: 90 tablet, Rfl: 5   carvedilol (COREG) 25 MG tablet, Take 25 mg by mouth 2 (two) times daily with a meal. , Disp: , Rfl:    Cholecalciferol 125 MCG (5000 UT) capsule, Take by mouth., Disp: , Rfl:    Continuous Blood Gluc Sensor (FREESTYLE LIBRE SENSOR SYSTEM) MISC, 1 kit., Disp: , Rfl:    dicyclomine (BENTYL) 20 MG tablet, Take 20 mg by mouth 4 (four) times daily., Disp: , Rfl:    DULoxetine (CYMBALTA) 30 MG capsule, TAKE 1 TABLET BY MOUTH TWICE DAILY, Disp: , Rfl:    hydrALAZINE (APRESOLINE) 25 MG tablet, Take 25 mg by mouth as needed. Take when BP is elevated, Disp: , Rfl:    Insulin Glargine, 2 Unit Dial, (TOUJEO MAX SOLOSTAR) 300 UNIT/ML SOPN, Inject 140 Units into the skin daily. 40 units AM and 110 units HS (Patient taking differently: Inject 25 Units into the skin at bedtime.), Disp: 1 pen, Rfl: 1   insulin lispro (HUMALOG) 100 UNIT/ML KwikPen, Inject into the skin at bedtime. Sliding scale, Disp: , Rfl:    lisinopril (PRINIVIL,ZESTRIL) 40 MG tablet, Take 40 mg by mouth daily., Disp: , Rfl:    Magnesium 500 MG CAPS, Take 1 capsule (500 mg total) by mouth 2 (two) times daily at 8 am and 10 pm., Disp: 60 capsule, Rfl: 11   metFORMIN (GLUCOPHAGE-XR) 500 MG 24 hr tablet, Take 1,000 mg by mouth 2 (two) times daily., Disp: , Rfl:    nortriptyline (PAMELOR) 50 MG capsule, Take 50 mg by mouth 2 (two) times daily., Disp: , Rfl:    pantoprazole (PROTONIX) 40 MG tablet, Take 40 mg by mouth 2 (two) times daily., Disp: , Rfl:    polyethylene glycol (MIRALAX / GLYCOLAX) packet, Take 17 g by mouth daily. Mix one tablespoon with 8oz of your favorite juice or water every day until you are having soft formed stools. Then start taking once daily if you didn't have a stool the day before. (Patient taking differently: Take 17 g by mouth daily as needed. Mix one tablespoon with 8oz of your favorite juice or water every day until you are having  soft formed stools. Then start taking once daily if you didn't have a stool the day before.), Disp: 30 each, Rfl: 0   pravastatin (PRAVACHOL) 10 MG tablet, Take 1 tablet by mouth at bedtime., Disp: , Rfl:    pregabalin (LYRICA) 25 MG capsule, 25 mg daily. , Disp: , Rfl:    sucralfate (CARAFATE) 1 g tablet, 2 (two) times daily., Disp: , Rfl:  traMADol (ULTRAM) 50 MG tablet, Take 1 tablet (50 mg total) by mouth daily. Must last 30 days, Disp: 30 tablet, Rfl: 5     ROS:  Review of Systems BREAST: No symptoms    Objective: There were no vitals taken for this visit.   OBGyn Exam  Results: No results found for this or any previous visit (from the past 24 hours).  Assessment/Plan:  No diagnosis found.   No orders of the defined types were placed in this encounter.           GYN counsel {counseling: 16159}    F/U  No follow-ups on file.  Tre Sanker B. Aiyonna Lucado, PA-C 05/04/2023 4:38 PM

## 2023-05-05 ENCOUNTER — Ambulatory Visit (INDEPENDENT_AMBULATORY_CARE_PROVIDER_SITE_OTHER): Admitting: Obstetrics and Gynecology

## 2023-05-05 ENCOUNTER — Other Ambulatory Visit (HOSPITAL_COMMUNITY)
Admission: RE | Admit: 2023-05-05 | Discharge: 2023-05-05 | Disposition: A | Discharge status: Home / Self Care | Source: Ambulatory Visit | Attending: Obstetrics and Gynecology | Admitting: Obstetrics and Gynecology

## 2023-05-05 ENCOUNTER — Encounter: Payer: Self-pay | Admitting: Obstetrics and Gynecology

## 2023-05-05 VITALS — BP 118/70 | Ht 59.0 in | Wt 151.0 lb

## 2023-05-05 DIAGNOSIS — Z1211 Encounter for screening for malignant neoplasm of colon: Secondary | ICD-10-CM

## 2023-05-05 DIAGNOSIS — Z87410 Personal history of cervical dysplasia: Secondary | ICD-10-CM | POA: Insufficient documentation

## 2023-05-05 DIAGNOSIS — Z1151 Encounter for screening for human papillomavirus (HPV): Secondary | ICD-10-CM

## 2023-05-05 DIAGNOSIS — Z1272 Encounter for screening for malignant neoplasm of vagina: Secondary | ICD-10-CM

## 2023-05-05 DIAGNOSIS — Z01419 Encounter for gynecological examination (general) (routine) without abnormal findings: Secondary | ICD-10-CM | POA: Diagnosis present

## 2023-05-05 DIAGNOSIS — R1032 Left lower quadrant pain: Secondary | ICD-10-CM

## 2023-05-05 DIAGNOSIS — Z124 Encounter for screening for malignant neoplasm of cervix: Secondary | ICD-10-CM

## 2023-05-05 DIAGNOSIS — Z1231 Encounter for screening mammogram for malignant neoplasm of breast: Secondary | ICD-10-CM

## 2023-05-05 NOTE — Patient Instructions (Addendum)
 I value your feedback and you entrusting [place] with your care. If you get a [facility] patient survey, I would appreciate you taking the time to let us know about your experience today. Thank you!  [facility] ([place])--[phone]

## 2023-05-07 ENCOUNTER — Ambulatory Visit
Admission: RE | Admit: 2023-05-07 | Discharge: 2023-05-07 | Disposition: A | Discharge status: Home / Self Care | Source: Ambulatory Visit | Attending: Obstetrics and Gynecology | Admitting: Obstetrics and Gynecology

## 2023-05-07 DIAGNOSIS — R1032 Left lower quadrant pain: Secondary | ICD-10-CM | POA: Diagnosis present

## 2023-05-07 LAB — CYTOLOGY - PAP
Adequacy: ABSENT
Comment: NEGATIVE
Diagnosis: NEGATIVE
High risk HPV: NEGATIVE

## 2023-05-11 ENCOUNTER — Encounter: Payer: Self-pay | Admitting: Nurse Practitioner

## 2023-05-11 ENCOUNTER — Ambulatory Visit: Attending: Nurse Practitioner | Admitting: Nurse Practitioner

## 2023-05-11 DIAGNOSIS — Z79899 Other long term (current) drug therapy: Secondary | ICD-10-CM | POA: Diagnosis present

## 2023-05-11 DIAGNOSIS — G8929 Other chronic pain: Secondary | ICD-10-CM | POA: Insufficient documentation

## 2023-05-11 DIAGNOSIS — M79605 Pain in left leg: Secondary | ICD-10-CM | POA: Diagnosis present

## 2023-05-11 DIAGNOSIS — G894 Chronic pain syndrome: Secondary | ICD-10-CM | POA: Diagnosis present

## 2023-05-11 DIAGNOSIS — M25572 Pain in left ankle and joints of left foot: Secondary | ICD-10-CM | POA: Insufficient documentation

## 2023-05-11 DIAGNOSIS — M47816 Spondylosis without myelopathy or radiculopathy, lumbar region: Secondary | ICD-10-CM | POA: Diagnosis present

## 2023-05-11 DIAGNOSIS — M4316 Spondylolisthesis, lumbar region: Secondary | ICD-10-CM | POA: Diagnosis present

## 2023-05-11 DIAGNOSIS — Z79891 Long term (current) use of opiate analgesic: Secondary | ICD-10-CM | POA: Insufficient documentation

## 2023-05-11 DIAGNOSIS — E1142 Type 2 diabetes mellitus with diabetic polyneuropathy: Secondary | ICD-10-CM | POA: Diagnosis present

## 2023-05-11 DIAGNOSIS — M25561 Pain in right knee: Secondary | ICD-10-CM | POA: Diagnosis present

## 2023-05-11 DIAGNOSIS — M545 Low back pain, unspecified: Secondary | ICD-10-CM | POA: Insufficient documentation

## 2023-05-11 DIAGNOSIS — M25562 Pain in left knee: Secondary | ICD-10-CM | POA: Insufficient documentation

## 2023-05-11 DIAGNOSIS — Z794 Long term (current) use of insulin: Secondary | ICD-10-CM

## 2023-05-11 DIAGNOSIS — M79604 Pain in right leg: Secondary | ICD-10-CM | POA: Insufficient documentation

## 2023-05-11 MED ORDER — TRAMADOL HCL 50 MG PO TABS: 50.0000 mg | ORAL_TABLET | Freq: Every day | ORAL | 0 refills | Status: DC

## 2023-05-11 NOTE — Progress Notes (Signed)
 PROVIDER NOTE: Interpretation of information contained herein should be left to medically-trained personnel. Specific patient instructions are provided elsewhere under "Patient Instructions" section of medical record. This document was created in part using AI and STT-dictation technology, any transcriptional errors that may result from this process are unintentional.  Patient: Charlene [name]  Service: E/M   PCP: Antonio Baumgarten, MD  DOB: October 21, 1960  DOS: 05/11/2023  Provider: Cherylin Corrigan, NP  MRN: [id number]  Delivery: Face-to-face  Specialty: Interventional Pain Management  Type: Established Patient  Setting: Ambulatory outpatient facility  Specialty designation: 09  Referring Prov.: Antonio Baumgarten, MD  Location: Outpatient office facility       HPI  [name], a 63 y.o. year old female, is here today because of her No primary diagnosis found.. Ms. Sorey primary complain today is Back Pain (lower)   Pain Assessment: Severity of   is reported as a 1 /10. Location:    / . Onset:  . Quality:  . Timing:  . Modifying factor(s):  Aaron Aas Vitals:  height is 4\' 11"  (1.499 m) and weight is 150 lb (68 kg). Her temperature is 97.6 F (36.4 C). Her blood pressure is 183/90 (abnormal) and her pulse is 88. Her oxygen saturation is 100%.  BMI: Estimated body mass index is 30.3 kg/m as calculated from the following:   Height as of this encounter: 4\' 11"  (1.499 m).   Weight as of this encounter: 150 lb (68 kg). Last encounter: 05/01/2023.  Reason for encounter: medication management.  The patient is doing well with the current medication regimen.  No adverse reaction or side effects reported to the medication.  Patient did not bring her pain medication bottle for pill count.  Therefore, a 1 month prescription was provided at this time.  Routine UDS ordered today. Pharmacotherapy Assessment  Analgesic: Tramadol  (Ultram ) 50 mg tablet daily at bedtime. MME=10  Monitoring: [place] PMP: PDMP reviewed  during this encounter.       Pharmacotherapy: No side-effects or adverse reactions reported. Compliance: No problems identified. Effectiveness: Clinically acceptable.  Merilyn Staple, RN  05/11/2023 10:50 AM  Sign when Signing Visit Safety precautions to be maintained throughout the outpatient stay will include: orient to surroundings, keep bed in low position, maintain call bell within reach at all times, provide assistance with transfer out of bed and ambulation.   Nursing Pain Medication Assessment:  Safety precautions to be maintained throughout the outpatient stay will include: orient to surroundings, keep bed in low position, maintain call bell within reach at all times, provide assistance with transfer out of bed and ambulation.  Medication Inspection Compliance: Ms. Mceachron did not comply with our request to bring her pills to be counted. She was reminded that bringing the medication bottles, even when empty, is a requirement.  Medication: Tramadol  (Ultram ) Pill/Patch Count: No pills available to be counted. Pill/Patch Appearance: No markings Bottle Appearance: No container available. Did not bring bottle(s) to appointment. Filled Date: no pills to count / no pills to count /  no pills to count Last Medication intake:   pt doesn't remember last time she took one    No results found for: "CBDTHCR" No results found for: "D8THCCBX" No results found for: "D9THCCBX"  UDS:  Summary  Date Value Ref Range Status  02/26/2022 Note  Final    Comment:    ==================================================================== [employer]ox[employer] Select 13 (MW) ==================================================================== Test  Result       Flag       Units  Drug Present and Declared for Prescription Verification   Tramadol                        4758         EXPECTED   ng/mg creat   O-Desmethyltramadol            >6173        EXPECTED   ng/mg creat    N-Desmethyltramadol            1183         EXPECTED   ng/mg creat    Source of tramadol  is a prescription medication. O-desmethyltramadol    and N-desmethyltramadol are expected metabolites of tramadol .  ==================================================================== Test                      Result    Flag   Units      Ref Range   Creatinine              81               mg/dL      >=95 ==================================================================== Declared Medications:  The flagging and interpretation on this report are based on the  following declared medications.  Unexpected results may arise from  inaccuracies in the declared medications.   **Note: The testing scope of this panel includes these medications:   Tramadol  (Ultram )   **Note: The testing scope of this panel does not include the  following reported medications:   Amlodipine  (Norvasc )  Baclofen  (Lioresal )  Carvedilol  (Coreg )  Cholecalciferol   Dicyclomine  (Bentyl )  Duloxetine  (Cymbalta )  Hydralazine (Apresoline)  Insulin  (Toujeo )  Lisinopril  (Zestril )  Magnesium   Metformin (Glucophage)  Nortriptyline (Pamelor)  Pantoprazole  (Protonix )  Polyethylene Glycol (MiraLAX )  Pravastatin (Pravachol)  Pregabalin (Lyrica)  Sucralfate (Carafate)  Topical Lidocaine  (Lidoderm ) ==================================================================== For clinical consultation, please call [phone]. ====================================================================       ROS  Constitutional: Denies any fever or chills Gastrointestinal: No reported hemesis, hematochezia, vomiting, or acute GI distress Musculoskeletal: Denies any acute onset joint swelling, redness, loss of ROM, or weakness Neurological: No reported episodes of acute onset apraxia, aphasia, dysarthria, agnosia, amnesia, paralysis, loss of coordination, or loss of consciousness  Medication Review  Cholecalciferol , DULoxetine , FreeStyle  Libre Sensor System, Glucagon HCl (Diagnostic), Insulin  Aspart FlexPen, Magnesium , albuterol, alendronate, amLODipine , baclofen , carvedilol , dicyclomine , empagliflozin, hydrALAZINE, insulin  glargine (2 Unit Dial), insulin  lispro, losartan, meloxicam , metFORMIN, nortriptyline, ondansetron , pantoprazole , polyethylene glycol, pravastatin, pregabalin, sucralfate, and traMADol   History Review  Allergy: Ms. Dreyfuss is allergic to semaglutide. Drug: Ms. Pritz  reports no history of drug use. Alcohol:  reports that she does not currently use alcohol. Tobacco:  reports that she has never smoked. She has never used smokeless tobacco. Social: Ms. Dory  reports that she has never smoked. She has never used smokeless tobacco. She reports that she does not currently use alcohol. She reports that she does not use drugs. Medical:  has a past medical history of Arthritis, Degenerative disc disease, lumbar, Diabetes mellitus without complication (HCC), GERD (gastroesophageal reflux disease), Hypertension, Neuropathy, Polyneuropathy, and Polyneuropathy. Surgical: Ms. Cannady  has a past surgical history that includes arthroscopic rotator cuff; Colonoscopy; Abdominal hysterectomy; endosopic sinus; Cataract extraction w/PHACO (Right, 11/25/2017); and Eye surgery. Family: family history includes Alcohol abuse in her father; Breast cancer in her maternal aunt; Cancer in her father; Pancreatic cancer  in her maternal aunt.  Laboratory Chemistry Profile   Renal Lab Results  Component Value Date   BUN 38 (H) 01/18/2023   CREATININE 2.10 (H) 01/18/2023   BCR 17 10/12/2017   GFRAA >60 12/23/2018   GFRNONAA 26 (L) 01/18/2023    Hepatic Lab Results  Component Value Date   AST 29 01/18/2023   ALT 21 01/18/2023   ALBUMIN 3.9 01/18/2023   ALKPHOS 94 01/18/2023   LIPASE 27 01/18/2023    Electrolytes Lab Results  Component Value Date   NA 136 01/18/2023   K 4.6 01/18/2023   CL 100 01/18/2023   CALCIUM  9.1  01/18/2023   MG 2.0 10/12/2017    Bone Lab Results  Component Value Date   25OHVITD1 17 (L) 10/12/2017   25OHVITD2 <1.0 10/12/2017   25OHVITD3 17 10/12/2017    Inflammation (CRP: Acute Phase) (ESR: Chronic Phase) Lab Results  Component Value Date   CRP 4 10/12/2017   ESRSEDRATE 12 10/12/2017         Note: Above Lab results reviewed.  Recent Imaging Review  DG Chest 2 View CLINICAL DATA:  Weakness, tremors, and nausea  EXAM: CHEST - 2 VIEW  COMPARISON:  None Available.  FINDINGS: Low lung volumes with bronchovascular crowding. Right mid lung linear opacity. Bibasilar patchy opacities. No pleural effusion or pneumothorax. The heart size and mediastinal contours are within normal limits. No acute osseous abnormality.  IMPRESSION: Low lung volumes with bronchovascular crowding. Right mid lung linear opacity and bibasilar patchy opacities, likely atelectasis. Aspiration or pneumonia can be considered in the appropriate clinical setting.  Electronically Signed   By: Limin  Xu M.D.   On: 01/18/2023 14:23 Note: Reviewed        Physical Exam  General appearance: Well nourished, well developed, and well hydrated. In no apparent acute distress Mental status: Alert, oriented x 3 (person, place, & time)       Respiratory: No evidence of acute respiratory distress Eyes: PERLA Vitals: BP (!) 183/90   Pulse 88   Temp 97.6 F (36.4 C)   Ht 4\' 11"  (1.499 m)   Wt 150 lb (68 kg)   SpO2 100%   BMI 30.30 kg/m  BMI: Estimated body mass index is 30.3 kg/m as calculated from the following:   Height as of this encounter: 4\' 11"  (1.499 m).   Weight as of this encounter: 150 lb (68 kg). Ideal: Patient must be at least 60 in tall to calculate ideal body weight  Assessment   Diagnosis Status  1. Chronic pain syndrome   2. 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 Chronic ankle and foot pain (1ry area of Pain) (Left)   4. Lumbar facet syndrome   5. Chronic knee pain  (4th area of Pain) (Bilateral) (L>R)   6. Chronic lower extremity pain (2ry area of Pain) (Bilateral) (L>R)   7. Chronic low back pain (Bilateral) w/o sciatica   8. 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 Pharmacologic therapy   10. Chronic use of opiate for therapeutic purpose   11. Encounter for medication management   12. Encounter for chronic pain management    Controlled Controlled Controlled   Plan of Care  Assessment and Plan We will continue on current medication regimen.  Prescribing drug monitoring (PDMP) reviewed with this encounter.  Advised patient to bring pain medication bottle for pill count at next visit (06/10/2023).   Interventional Therapies  Risk Factors  Considerations:   NOTE:  UNCONTROLLED IDDM (NO STEROIDS)  ABNORMAL UDS (09/03/2020) (+) for COCAINE          Planned  Pending:  (NO STEROIDS)  Diagnostic bilateral L4-5 and L5-S1 lumbar facet MBB #1 (no steroids)     Under consideration:   Diagnostic L4-5 and L5-S1 lumbar facet MBB (no steroids)  Diagnostic left genicular nerve block #1 (no steroids) (to be done after lumbar facet block.) Possible bilateral lumbar facet RFA  Diagnostic bilateral genicular NB     Completed:   Diagnostic right lumbar facet MBB x4 (no steroid) (05/13/2022) (100/100/75/75)  Diagnostic left lumbar facet MBB x1 (w/ steroid) (10/09/2020) (100/100/90/L-100; R-50)  Therapeutic left L3-4 LESI x1 (02/11/2022) (100/100/75/100)  Therapeutic left L4-5 LESI x4 (09/21/2018) (100/100/50/70)  Therapeutic left L5 TFESI x4 (09/21/2018) (100/100/50/70)  Therapeutic left L5-S1 LESI x2 (02/25/2018) (100/100/100/100)  Therapeutic bilateral Hyalgan knee inj. x5 (02/25/2018) (100/100/100 x2 weeks/<50)  Therapeutic left IA steroid knee inj. x1 (11/23/2017) (100/100/0/0)  Therapeutic left IA Gelsyn-3 knee inj. x3 (02/11/2022) (100/100/75/100) Therapeutic left IA Monovisc knee inj. x1 (03/18/2022) (100/100/50/50)     Therapeutic   Palliative (PRN) options:  (NO PRN PROCEDURES W/O MD F2F EVALUATION) Palliative L5 TFESI  Palliative L4-5 LESI   Palliative L5-S1 LESI   Palliative Hyalgan knee inj.       Pharmacotherapy (Medications Ordered): Meds ordered this encounter  Medications   traMADol  (ULTRAM ) 50 MG tablet    Sig: Take 1 tablet (50 mg total) by mouth daily. Must last 30 days    Dispense:  30 tablet    Refill:  0    Chronic Pain: STOP Act (Not applicable) Fill 1 day early if closed on refill date. Avoid benzodiazepines within 8 hours of opioids   Orders:  Orders Placed This Encounter  Procedures   Tox[employer]SU[employer] Select 13 (MW), Urine    Volume: 30 ml(s). Minimum 3 ml of urine is needed. Document temperature of fresh sample. Indications: Long term (current) use of opiate analgesic (Y78.295)    Release to patient:   Immediate   Follow-up plan:   Return in about 1 month (around 06/10/2023) for (F2F), (MM), Marthe Slain NP.        Recent Visits No visits were found meeting these conditions. Showing recent visits within past 90 days and meeting all other requirements Today's Visits Date Type Provider Dept  05/11/23 Office Visit Betzaida Cremeens K, NP [facility]  Showing today's visits and meeting all other requirements Future Appointments Date Type Provider Dept  06/08/23 Appointment Kyo Cocuzza K, NP [facility]  Showing future appointments within next 90 days and meeting all other requirements  I discussed the assessment and treatment plan with the patient. The patient was provided an opportunity to ask questions and all were answered. The patient agreed with the plan and demonstrated an understanding of the instructions.  Patient advised to call back or seek an in-person evaluation if the symptoms or condition worsens.  Duration of encounter: 30 minutes.  Total time on encounter, as per AMA guidelines included both the face-to-face and non-face-to-face time personally spent by the  physician and/or other qualified health care professional(s) on the day of the encounter (includes time in activities that require the physician or other qualified health care professional and does not include time in activities normally performed by clinical staff). Physician's time may include the following activities when performed: Preparing to see the patient (e.g., pre-charting review of records, searching for previously ordered imaging, lab work, and nerve conduction tests)  Review of prior analgesic pharmacotherapies. Reviewing PMP Interpreting ordered tests (e.g., lab work, imaging, nerve conduction tests) Performing post-procedure evaluations, including interpretation of diagnostic procedures Obtaining and/or reviewing separately obtained history Performing a medically appropriate examination and/or evaluation Counseling and educating the patient/family/caregiver Ordering medications, tests, or procedures Referring and communicating with other health care professionals (when not separately reported) Documenting clinical information in the electronic or other health record Independently interpreting results (not separately reported) and communicating results to the patient/ family/caregiver Care coordination (not separately reported)  Note by: Chaka Jefferys K Adabelle Griffiths, NP (TTS and AI technology used. I apologize for any typographical errors that were not detected and corrected.) Date: 05/11/2023; Time: 11:28 AM

## 2023-05-11 NOTE — Progress Notes (Signed)
 Safety precautions to be maintained throughout the outpatient stay will include: orient to surroundings, keep bed in low position, maintain call bell within reach at all times, provide assistance with transfer out of bed and ambulation.   Nursing Pain Medication Assessment:  Safety precautions to be maintained throughout the outpatient stay will include: orient to surroundings, keep bed in low position, maintain call bell within reach at all times, provide assistance with transfer out of bed and ambulation.  Medication Inspection Compliance: Ms. Kahre did not comply with our request to bring her pills to be counted. She was reminded that bringing the medication bottles, even when empty, is a requirement.  Medication: Tramadol  (Ultram ) Pill/Patch Count: No pills available to be counted. Pill/Patch Appearance: No markings Bottle Appearance: No container available. Did not bring bottle(s) to appointment. Filled Date: no pills to count / no pills to count /  no pills to count Last Medication intake:   pt doesn't remember last time she took one

## 2023-05-15 LAB — TOXASSURE SELECT 13 (MW), URINE

## 2023-05-17 ENCOUNTER — Encounter: Payer: Self-pay | Admitting: Obstetrics and Gynecology

## 2023-06-08 ENCOUNTER — Ambulatory Visit (HOSPITAL_BASED_OUTPATIENT_CLINIC_OR_DEPARTMENT_OTHER): Admitting: Nurse Practitioner

## 2023-06-08 DIAGNOSIS — G894 Chronic pain syndrome: Secondary | ICD-10-CM

## 2023-06-08 DIAGNOSIS — Z91199 Patient's noncompliance with other medical treatment and regimen due to unspecified reason: Secondary | ICD-10-CM

## 2023-06-08 NOTE — Progress Notes (Signed)
 06/08/2023- No Show

## 2023-06-17 ENCOUNTER — Telehealth: Payer: Self-pay

## 2023-06-17 NOTE — Telephone Encounter (Signed)
 Attempted to reach patient to return call. Mailbox was full, so no message left.

## 2023-06-18 ENCOUNTER — Encounter: Payer: Self-pay | Admitting: Pain Medicine

## 2023-06-18 ENCOUNTER — Ambulatory Visit: Attending: Pain Medicine | Admitting: Pain Medicine

## 2023-06-18 VITALS — BP 157/90 | HR 94 | Temp 97.9°F | Resp 16 | Ht 59.0 in | Wt 150.0 lb

## 2023-06-18 DIAGNOSIS — G8929 Other chronic pain: Secondary | ICD-10-CM | POA: Insufficient documentation

## 2023-06-18 DIAGNOSIS — M4316 Spondylolisthesis, lumbar region: Secondary | ICD-10-CM | POA: Insufficient documentation

## 2023-06-18 DIAGNOSIS — R94131 Abnormal electromyogram [EMG]: Secondary | ICD-10-CM | POA: Diagnosis present

## 2023-06-18 DIAGNOSIS — M170 Bilateral primary osteoarthritis of knee: Secondary | ICD-10-CM | POA: Insufficient documentation

## 2023-06-18 DIAGNOSIS — Z90710 Acquired absence of both cervix and uterus: Secondary | ICD-10-CM | POA: Insufficient documentation

## 2023-06-18 DIAGNOSIS — M5459 Other low back pain: Secondary | ICD-10-CM | POA: Insufficient documentation

## 2023-06-18 DIAGNOSIS — M4310 Spondylolisthesis, site unspecified: Secondary | ICD-10-CM | POA: Insufficient documentation

## 2023-06-18 DIAGNOSIS — M47816 Spondylosis without myelopathy or radiculopathy, lumbar region: Secondary | ICD-10-CM | POA: Insufficient documentation

## 2023-06-18 DIAGNOSIS — M545 Low back pain, unspecified: Secondary | ICD-10-CM | POA: Diagnosis present

## 2023-06-18 DIAGNOSIS — M25561 Pain in right knee: Secondary | ICD-10-CM | POA: Diagnosis present

## 2023-06-18 NOTE — Patient Instructions (Signed)

## 2023-06-18 NOTE — Progress Notes (Signed)
 PROVIDER NOTE: Interpretation of information contained herein should be left to medically-trained personnel. Specific patient instructions are provided elsewhere under "Patient Instructions" section of medical record. This document was created in part using AI and STT-dictation technology, any transcriptional errors that may result from this process are unintentional.  Patient: Charlene [name]  Service: E/M   PCP: Antonio Baumgarten, MD  DOB: 03-18-1960  DOS: 06/18/2023  Provider: Candi Chafe, MD  MRN: [id number]  Delivery: Face-to-face  Specialty: Interventional Pain Management  Type: Established Patient  Setting: Ambulatory outpatient facility  Specialty designation: 09  Referring Prov.: Antonio Baumgarten, MD  Location: Outpatient office facility       History of present illness (HPI) Charlene [name], a 63 y.o. year old female, is here today because of her Chronic bilateral low back pain without sciatica [M54.50, G89.29]. Charlene [name] primary complain today is Back Pain (Lower bilateral ) and Knee Pain (Right )  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HCC); Chronic ankle pain (Left); Chronic foot pain (Left); Chronic lower extremity pain (2ry area of Pain) (Bilateral) (L>R); Chronic knee pain (4th area of Pain) (Bilateral) (L>R); Chronic low back pain (3ry area of Pain) (Bilateral) (L>R) w/ sciatica (Left); Chronic pain syndrome; DM type 2 with diabetic peripheral neuropathy (HCC); DDD (degenerative disc disease), lumbar; Lumbar facet arthropathy; Lumbar facet syndrome; Lumbar facet hypertrophy; Lumbar foraminal stenosis (L5-S1) (Right); Osteoarthritis of knee (Bilateral); Chronic musculoskeletal pain; Abnormal EMG (07/06/2017); Osteoarthritis involving multiple joints; 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); Knee pain;  Back pain with left-sided sciatica; Meningioma (HCC); Neck pain; Chronic pain disorder; Left leg weakness; Meralgia paraesthetica, left; Pain in both lower extremities; Arthropathy of lumbar facet joint; Chronic ankle and foot pain (1ry area of Pain) (Left); 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  Pain Assessment: Severity of Chronic pain is reported as a 2 /10. Location: Knee Right (Inner)/Denies. Onset: More than a month ago. Quality: Constant, Aching. Timing: Constant. Modifying factor(s): extra strength tylenol . Vitals:  height is 4\' 11"  (1.499 m) and weight is 150 lb (68 kg). Her temporal temperature is 97.9 F (36.6 C). Her blood pressure is 157/90 (abnormal) and her pulse is 94. Her respiration is 16 and oxygen saturation is 97%.  BMI: Estimated body mass index is 30.3 kg/m as calculated from the following:   Height as of this encounter: 4\' 11"  (1.499 m).   Weight as of this encounter: 150 lb (68 kg).  Last encounter: 11/20/2022. Last procedure: 11/06/2022.  Reason for encounter: evaluation of worsening, or previously known (established) problem.   Discussed the use of AI scribe software for clinical note transcription with the patient, who gave verbal consent to proceed.  History of Present Illness   Charlene [name] is a 63 year old female with diabetes who presents with right knee and lower  back pain.  She experiences pain on the medial aspect of her right  knee, which has been problematic in the past. She recalls a fall that may have contributed to the knee pain. Previously, she has received knee injections with gel, which have been effective in managing her symptoms.  She also experiences lower back pain, more pronounced on the left side, without radiation down the legs. In the past, she has received facet injections for her back pain, which have provided long-term relief.  She has a history of diabetes, which she manages with her blood sugar levels reportedly down to 67. She was hospitalized for dehydration, but it was a while ago and only for one night.  No radiation of back pain down the legs. No use of blood thinners.       Pharmacotherapy Assessment   Analgesic: Tramadol  50 mg, 1 tab PO QD. ABNORMAL UDS (09/03/2020) (+) for COCAINE. MME: 5 mg/day   Monitoring: [place] PMP: PDMP reviewed during this encounter.       Pharmacotherapy: No side-effects or adverse reactions reported. Compliance: No problems identified. Effectiveness: Clinically acceptable.  Huston Maiers, RN  06/18/2023  2:31 PM  Sign when Signing Visit Safety precautions to be maintained throughout the outpatient stay will include: orient to surroundings, keep bed in low position, maintain call bell within reach at all times, provide assistance with transfer out of bed and ambulation.     No results found for: "CBDTHCR" No results found for: "D8THCCBX" No results found for: "D9THCCBX"  UDS:  Summary  Date Value Ref Range Status  05/11/2023 FINAL  Final    Comment:    ==================================================================== [employer] Select 13 (MW) ==================================================================== Test                             Result       Flag       Units  Drug Absent but Declared for Prescription Verification   Tramadol                        Not Detected UNEXPECTED  ng/mg creat ==================================================================== Test                      Result    Flag   Units      Ref Range   Creatinine              21               mg/dL      >=54 ==================================================================== Declared Medications:  The flagging and interpretation on this report are based on the  following declared medications.  Unexpected results may arise from  inaccuracies in the declared medications.   **Note: The testing scope of this panel includes these medications:   Tramadol  (Ultram )   **Note: The testing scope of this panel does not include the  following reported medications:   Albuterol (Ventolin HFA)  Alendronate (Fosamax)  Amlodipine  (Norvasc )  Baclofen  (Lioresal )  Carvedilol  (Coreg )  Dicyclomine  (Bentyl )  Duloxetine  (Cymbalta )  Empagliflozin (Jardiance)  Glucagon  Hydralazine (Apresoline)  Insulin  (NovoLog )  Losartan (Cozaar)  Magnesium   Meloxicam  (Mobic )  Metformin (Glucophage)  Nortriptyline (Pamelor)  Ondansetron  (Zofran )  Pantoprazole  (Protonix )  Polyethylene Glycol (MiraLAX )  Pravastatin (Pravachol)  Pregabalin (Lyrica)  Sucralfate (Carafate)  Vitamin D  ==================================================================== For clinical consultation, please call [phone]. ====================================================================      ROS  Constitutional: Denies any fever or chills Gastrointestinal: No reported hemesis, hematochezia, vomiting, or acute GI distress Musculoskeletal:  Denies any acute onset joint swelling, redness, loss of ROM, or weakness Neurological: No reported episodes of acute onset apraxia, aphasia, dysarthria, agnosia, amnesia, paralysis, loss of coordination, or loss of consciousness  Medication Review  Cholecalciferol , DULoxetine , FreeStyle Libre Sensor System, Glucagon HCl (Diagnostic), Insulin  Aspart FlexPen, Magnesium , albuterol, alendronate,  amLODipine , baclofen , carvedilol , dicyclomine , empagliflozin, hydrALAZINE, insulin  glargine (2 Unit Dial), insulin  lispro, losartan, meloxicam , metFORMIN, nortriptyline, ondansetron , pantoprazole , polyethylene glycol, pravastatin, pregabalin, sucralfate, and traMADol   History Review  Allergy: Charlene [name] is allergic to semaglutide. Drug: Charlene [name]  reports no history of drug use. Alcohol:  reports that she does not currently use alcohol. Tobacco:  reports that she has never smoked. She has never used smokeless tobacco. Social: Charlene [name]  reports that she has never smoked. She has never used smokeless tobacco. She reports that she does not currently use alcohol. She reports that she does not use drugs. Medical:  has a past medical history of Arthritis, Degenerative disc disease, lumbar, Diabetes mellitus without complication (HCC), GERD (gastroesophageal reflux disease), Hypertension, Neuropathy, Polyneuropathy, and Polyneuropathy. Surgical: Charlene [name]  has a past surgical history that includes arthroscopic rotator cuff; Colonoscopy; Abdominal hysterectomy; endosopic sinus; Cataract extraction w/PHACO (Right, 11/25/2017); and Eye surgery. Family: family history includes Alcohol abuse in her father; Breast cancer in her maternal aunt; Cancer in her father; Pancreatic cancer in her maternal aunt.  Laboratory Chemistry Profile   Renal Lab Results  Component Value Date   BUN 38 (H) 01/18/2023   CREATININE 2.10 (H) 01/18/2023   BCR 17 10/12/2017   GFRAA >60 12/23/2018   GFRNONAA 26 (L) 01/18/2023    Hepatic Lab Results  Component Value Date   AST 29 01/18/2023   ALT 21 01/18/2023   ALBUMIN 3.9 01/18/2023   ALKPHOS 94 01/18/2023   LIPASE 27 01/18/2023    Electrolytes Lab Results  Component Value Date   NA 136 01/18/2023   K 4.6 01/18/2023   CL 100 01/18/2023   CALCIUM  9.1 01/18/2023   MG 2.0 10/12/2017    Bone Lab Results  Component Value Date   25OHVITD1 17 (L) 10/12/2017    25OHVITD2 <1.0 10/12/2017   25OHVITD3 17 10/12/2017    Inflammation (CRP: Acute Phase) (ESR: Chronic Phase) Lab Results  Component Value Date   CRP 4 10/12/2017   ESRSEDRATE 12 10/12/2017         Note: Above Lab results reviewed.  Recent Imaging Review  US  PELVIC COMPLETE WITH TRANSVAGINAL CLINICAL DATA:  Left lower quadrant pain  EXAM: TRANSABDOMINAL AND TRANSVAGINAL ULTRASOUND OF PELVIS  TECHNIQUE: Both transabdominal and transvaginal ultrasound examinations of the pelvis were performed. Transabdominal technique was performed for global imaging of the pelvis including uterus, ovaries, adnexal regions, and pelvic cul-de-sac. It was necessary to proceed with endovaginal exam following the transabdominal exam to visualize the adnexa.  COMPARISON:  renal stone protocol CT 08/04/2022  FINDINGS: Uterus  Surgically absent  Right ovary  Not visualized  Left ovary  Not visualized  Other findings  No abnormal free fluid.  IMPRESSION: No significant sonographic abnormality of the pelvis. Nonvisualization of the ovaries.  Electronically Signed   By: Elester Grim M.D.   On: 05/15/2023 16:22 Note: Reviewed         Physical Exam  General appearance: Well nourished, well developed, and well hydrated. In no apparent acute distress Mental status: Alert, oriented x 3 (person, place, & time)       Respiratory: No evidence of acute respiratory distress Eyes: PERLA Vitals: BP Aaron Aas)  157/90 (Patient Position: Sitting, Cuff Size: Normal)   Pulse 94   Temp 97.9 F (36.6 C) (Temporal)   Resp 16   Ht 4\' 11"  (1.499 m)   Wt 150 lb (68 kg)   SpO2 97%   BMI 30.30 kg/m  BMI: Estimated body mass index is 30.3 kg/m as calculated from the following:   Height as of this encounter: 4\' 11"  (1.499 m).   Weight as of this encounter: 150 lb (68 kg). Ideal: Patient must be at least 60 in tall to calculate ideal body weight  Assessment   Diagnosis Status  1. Chronic low back pain  (Bilateral) w/o sciatica   2. Lumbar facet joint pain   3. Chronic knee pain (Right)   4. 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 Grade 1 Retrolisthesis of L2/L3   6. Lumbar facet arthropathy   7. Lumbar facet hypertrophy   8. Lumbar facet syndrome   9. Osteoarthritis of knee (Bilateral)    Having a Flare-up Worsening Worsening   Updated Problems: Problem  Chronic knee pain (Right)  H/O: Hysterectomy  Combined Forms of Age-Related Cataract of Left Eye    Plan of Care  Problem-specific:  Assessment and Plan    Right knee pain   Chronic right knee pain is effectively managed with gel injections, and there has been no recent trauma. She prefers to continue with this treatment. Schedule a Gelsene gel injection series for the right knee, consisting of three shots.  Low back pain   Chronic low back pain is more pronounced on the left side and does not radiate to the legs. Previous facet injections provided relief. Continue injections without steroids to prevent hyperglycemia. Sedation is required for the procedure, so ensure she has a driver.  Type 2 diabetes mellitus   She has type 2 diabetes mellitus with a recent blood glucose level of 67 mg/dL. There have been no recent diabetes-related hospitalizations, though she was previously hospitalized for dehydration. Avoid steroid use in pain management to prevent hyperglycemia.       Charlene [name] has a current medication list which includes the following long-term medication(s): albuterol, amlodipine , carvedilol , cholecalciferol , dicyclomine , duloxetine , hydralazine, toujeo  max solostar, insulin  lispro, losartan, nortriptyline, pantoprazole , pravastatin, pregabalin, sucralfate, baclofen , magnesium , and tramadol .  Pharmacotherapy (Medications Ordered): No orders of the defined types were placed in this encounter.  Orders:  Orders Placed This Encounter  Procedures   KNEE INJECTION    Indications: Knee arthralgia  (pain) due to osteoarthritis (OA) Imaging: None (CPT-20610) Nursing: Please request pharmacy to have Gelsyn-3 (Hyaluronan) available in office.    Standing Status:   Future    Expected Date:   07/02/2023    Expiration Date:   09/18/2023    Scheduling Instructions:     Procedure: Knee injection Gelsyn-3 (hyaluronate 8.40mg /mL) (16.8mg /8mL)     Treatment No.: 1 of 3     Level: Intra-articular     Laterality: Right Knee     Sedation: Patient's choice.     Timeframe: As soon as schedule allows     Total Hyaluronic acid (AKA hyaluronan or hyaluronate) per injection: 16.8 mg (8.40 mg/mL x 2mL). Series of 3 injections. (Weekly)    Where will this procedure be performed?:   [facility]   LUMBAR FACET(MEDIAL BRANCH NERVE BLOCK) MBNB    Diagnosis: Lumbar Facet Syndrome (M47.816); Lumbosacral Facet Syndrome (M47.817); Lumbar Facet Joint Pain (M54.59) Medical Necessity Statement: 1.Severe chronic axial low back pain causing functional  impairment documented by ongoing pain scale assessments. 2.Pain present for longer than 3 months (Chronic) documented to have failed noninvasive conservative therapies. 3.Absence of untreated radiculopathy. 4.There is no radiological evidence of untreated fractures, tumor, infection, or deformity.  Physical Examination Findings: Positive Kemp Maneuver: (Y)  Positive Lumbar Hyperextension-Rotation provocative test: (Y)    Standing Status:   Future    Expiration Date:   09/18/2023    Scheduling Instructions:     Procedure: Lumbar facet Block     Type: Medial Branch Block     Side: Bilateral     Purpose: Therapeutic     Level(s): L3-4, L4-5, and L5-S1 Facets (L2, L3, L4, L5, and S1 Medial Branch)     Sedation: With Sedation.     Timeframe: As soon as schedule allows.    Where will this procedure be performed?:   [facility]     Interventional Therapies  Risk Factors  Considerations:   NOTE: UNCONTROLLED IDDM (NO STEROIDS)  ABNORMAL UDS  (09/03/2020) (+) for COCAINE       Planned  Pending:  (NO STEROIDS)  Diagnostic bilateral L4-5 and L5-S1 lumbar facet MBB #1 (no steroids)    Under consideration:   Diagnostic L4-5 and L5-S1 lumbar facet MBB (no steroids)  Diagnostic left genicular nerve block #1 (no steroids) (to be done after lumbar facet block.) Possible bilateral lumbar facet RFA  Diagnostic bilateral genicular NB    Completed:   Diagnostic right lumbar facet MBB x4 (no steroid) (05/13/2022) (100/100/75/75)  Diagnostic left lumbar facet MBB x1 (w/ steroid) (10/09/2020) (100/100/90/L-100; R-50)  Therapeutic left L3-4 LESI x1 (02/11/2022) (100/100/75/100)  Therapeutic left L4-5 LESI x4 (09/21/2018) (100/100/50/70)  Therapeutic left L5 TFESI x4 (09/21/2018) (100/100/50/70)  Therapeutic left L5-S1 LESI x2 (02/25/2018) (100/100/100/100)  Therapeutic bilateral Hyalgan knee inj. x5 (02/25/2018) (100/100/100 x2 weeks/<50)  Therapeutic left IA steroid knee inj. x1 (11/23/2017) (100/100/0/0)  Therapeutic left IA Gelsyn-3 knee inj. x3 (02/11/2022) (100/100/75/100) Therapeutic left IA Monovisc knee inj. x1 (03/18/2022) (100/100/50/50)    Therapeutic  Palliative (PRN) options:  (NO PRN PROCEDURES W/O MD F2F EVALUATION) Palliative L5 TFESI  Palliative L4-5 LESI   Palliative L5-S1 LESI   Palliative Hyalgan knee inj.     Return for (ECT): (B) L-FCT BLK (w/o STEROIDS) + (R) IA Gelsyn-3 Knee inj. #1.    Recent Visits Date Type Provider Dept  05/11/23 Office Visit Patel, Seema K, NP [facility]  Showing recent visits within past 90 days and meeting all other requirements Today's Visits Date Type Provider Dept  06/18/23 Office Visit Renaldo Caroli, MD [facility]  Showing today's visits and meeting all other requirements Future Appointments Date Type Provider Dept  06/24/23 Appointment Patel, Seema K, NP [facility]  06/30/23 Appointment Renaldo Caroli, MD [facility]  Showing  future appointments within next 90 days and meeting all other requirements  I discussed the assessment and treatment plan with the patient. The patient was provided an opportunity to ask questions and all were answered. The patient agreed with the plan and demonstrated an understanding of the instructions.  Patient advised to call back or seek an in-person evaluation if the symptoms or condition worsens.  Duration of encounter: 30 minutes.  Total time on encounter, as per AMA guidelines included both the face-to-face and non-face-to-face time personally spent by the physician and/or other qualified health care professional(s) on the day of the encounter (includes time in activities that require the physician or other qualified health care professional  and does not include time in activities normally performed by clinical staff). Physician's time may include the following activities when performed: Preparing to see the patient (e.g., pre-charting review of records, searching for previously ordered imaging, lab work, and nerve conduction tests) Review of prior analgesic pharmacotherapies. Reviewing PMP Interpreting ordered tests (e.g., lab work, imaging, nerve conduction tests) Performing post-procedure evaluations, including interpretation of diagnostic procedures Obtaining and/or reviewing separately obtained history Performing a medically appropriate examination and/or evaluation Counseling and educating the patient/family/caregiver Ordering medications, tests, or procedures Referring and communicating with other health care professionals (when not separately reported) Documenting clinical information in the electronic or other health record Independently interpreting results (not separately reported) and communicating results to the patient/ family/caregiver Care coordination (not separately reported)  Note by: Candi Chafe, MD (TTS and AI technology used. I apologize for any  typographical errors that were not detected and corrected.) Date: 06/18/2023; Time: 3:03 PM

## 2023-06-18 NOTE — Progress Notes (Signed)
 Safety precautions to be maintained throughout the outpatient stay will include: orient to surroundings, keep bed in low position, maintain call bell within reach at all times, provide assistance with transfer out of bed and ambulation.

## 2023-06-24 ENCOUNTER — Encounter: Payer: Self-pay | Admitting: Nurse Practitioner

## 2023-06-24 ENCOUNTER — Ambulatory Visit: Attending: Nurse Practitioner | Admitting: Nurse Practitioner

## 2023-06-24 DIAGNOSIS — M25561 Pain in right knee: Secondary | ICD-10-CM | POA: Insufficient documentation

## 2023-06-24 DIAGNOSIS — M79605 Pain in left leg: Secondary | ICD-10-CM | POA: Insufficient documentation

## 2023-06-24 DIAGNOSIS — G8929 Other chronic pain: Secondary | ICD-10-CM | POA: Insufficient documentation

## 2023-06-24 DIAGNOSIS — E1142 Type 2 diabetes mellitus with diabetic polyneuropathy: Secondary | ICD-10-CM | POA: Insufficient documentation

## 2023-06-24 DIAGNOSIS — M545 Low back pain, unspecified: Secondary | ICD-10-CM | POA: Insufficient documentation

## 2023-06-24 DIAGNOSIS — Z79891 Long term (current) use of opiate analgesic: Secondary | ICD-10-CM | POA: Insufficient documentation

## 2023-06-24 DIAGNOSIS — M25562 Pain in left knee: Secondary | ICD-10-CM | POA: Diagnosis present

## 2023-06-24 DIAGNOSIS — Z79899 Other long term (current) drug therapy: Secondary | ICD-10-CM | POA: Insufficient documentation

## 2023-06-24 DIAGNOSIS — M25572 Pain in left ankle and joints of left foot: Secondary | ICD-10-CM | POA: Insufficient documentation

## 2023-06-24 DIAGNOSIS — M47816 Spondylosis without myelopathy or radiculopathy, lumbar region: Secondary | ICD-10-CM | POA: Insufficient documentation

## 2023-06-24 DIAGNOSIS — Z794 Long term (current) use of insulin: Secondary | ICD-10-CM

## 2023-06-24 DIAGNOSIS — M4316 Spondylolisthesis, lumbar region: Secondary | ICD-10-CM | POA: Insufficient documentation

## 2023-06-24 DIAGNOSIS — M79604 Pain in right leg: Secondary | ICD-10-CM | POA: Insufficient documentation

## 2023-06-24 DIAGNOSIS — G894 Chronic pain syndrome: Secondary | ICD-10-CM | POA: Diagnosis not present

## 2023-06-24 MED ORDER — TRAMADOL HCL 50 MG PO TABS: 50.0000 mg | ORAL_TABLET | Freq: Every day | ORAL | 0 refills | Status: DC

## 2023-06-24 NOTE — Progress Notes (Signed)
 Nursing Pain Medication Assessment:  Safety precautions to be maintained throughout the outpatient stay will include: orient to surroundings, keep bed in low position, maintain call bell within reach at all times, provide assistance with transfer out of bed and ambulation.  Medication Inspection Compliance: Pill count conducted under aseptic conditions, in front of the patient. Neither the pills nor the bottle was removed from the patient's sight at any time. Once count was completed pills were immediately returned to the patient in their original bottle.  Medication: Tramadol  (Ultram ) Pill/Patch Count: 20 of 30 pills/patches remain Pill/Patch Appearance: Markings consistent with prescribed medication Bottle Appearance: Standard pharmacy container. Clearly labeled. Filled Date: 4 / 21 / 2025 Last Medication intake:  Yesterday

## 2023-06-24 NOTE — Progress Notes (Signed)
 PROVIDER NOTE: Interpretation of information contained herein should be left to medically-trained personnel. Specific patient instructions are provided elsewhere under "Patient Instructions" section of medical record. This document was created in part using AI and STT-dictation technology, any transcriptional errors that may result from this process are unintentional.  Patient: Charlene [name]  Service: E/M   PCP: Antonio Baumgarten, MD  DOB: 1960-04-16  DOS: 06/24/2023  Provider: Cherylin Corrigan, NP  MRN: [id number]  Delivery: Face-to-face  Specialty: Interventional Pain Management  Type: Established Patient  Setting: Ambulatory outpatient facility  Specialty designation: 09  Referring Prov.: Antonio Baumgarten, MD  Location: Outpatient office facility       History of present illness (HPI) [name], a 63 y.o. year old female, is here today because of her No primary diagnosis found.. [name] primary complain today is Knee Pain (right) and Back Pain  Pertinent problems: [name] has Burning sensation of feet; Neuropathic pain; Neuropathy; Lower extremity numbness and tingling (Left); Polyneuropathy associated with underlying disease (HCC); Chronic ankle pain (Left); Chronic foot pain (Left); Chronic pain syndrome; Lumbar facet syndrome; Lumbar facet arthropathy; Osteoarthritis of knee (Bilateral); Chronic musculoskeletal pain; and DDD (degenerative disc disease) on their pertinent problem list.   Pain Assessment: Severity of Chronic pain is reported as a 1 /10. Location: Knee Right/denies. Onset: More than a month ago. Quality: Aching, Constant. Timing: Constant. Modifying factor(s): meds. Vitals:  height is 4\' 11"  (1.499 m) and weight is 150 lb (68 kg). Her temperature is 97.4 F (36.3 C) (abnormal). Her blood pressure is 164/92 (abnormal) and her pulse is 99. Her respiration is 16 and oxygen saturation is 99%.  BMI: Estimated body mass index is 30.3 kg/m as calculated from the  following:   Height as of this encounter: 4\' 11"  (1.499 m).   Weight as of this encounter: 150 lb (68 kg).  Last encounter: 06/18/2023 Last procedure: 11/06/2022  Reason for encounter: medication management. The patient is doing well with the current medication regimen. No adverse reaction or side effects reported to the medication. The patient complains of right knee pain and back pain.  The patient uses tramadol  strictly on an as-needed basis.  A 30-day supply of tramadol  50 Mg is sufficient to manage pain correct approximately 3 months.  Pain relief is adequately maintained with this usage pattern.   Pharmacotherapy Assessment  Analgesic: Tramadol  (Ultram ) 50 mg tablet daily at bedtime. MME=10  Monitoring: [place] PMP: PDMP reviewed during this encounter.       Pharmacotherapy: No side-effects or adverse reactions reported. Compliance: No problems identified. Effectiveness: Clinically acceptable.  Lennis Rabon, RN  06/24/2023 12:00 PM  Sign when Signing Visit Nursing Pain Medication Assessment:  Safety precautions to be maintained throughout the outpatient stay will include: orient to surroundings, keep bed in low position, maintain call bell within reach at all times, provide assistance with transfer out of bed and ambulation.  Medication Inspection Compliance: Pill count conducted under aseptic conditions, in front of the patient. Neither the pills nor the bottle was removed from the patient's sight at any time. Once count was completed pills were immediately returned to the patient in their original bottle.  Medication: Tramadol  (Ultram ) Pill/Patch Count: 20 of 30 pills/patches remain Pill/Patch Appearance: Markings consistent with prescribed medication Bottle Appearance: Standard pharmacy container. Clearly labeled. Filled Date: 4 / 21 / 2025 Last Medication intake:  Yesterday    No results found for: "CBDTHCR" No results found for: "D8THCCBX" No results found for: "D9THCCBX"  UDS:   Summary  Date Value Ref Range Status  05/11/2023 FINAL  Final    Comment:    ==================================================================== [employer] Select 13 (MW) ==================================================================== Test                             Result       Flag       Units  Drug Absent but Declared for Prescription Verification   Tramadol                        Not Detected UNEXPECTED ng/mg creat ==================================================================== Test                      Result    Flag   Units      Ref Range   Creatinine              21               mg/dL      >=09 ==================================================================== Declared Medications:  The flagging and interpretation on this report are based on the  following declared medications.  Unexpected results may arise from  inaccuracies in the declared medications.   **Note: The testing scope of this panel includes these medications:   Tramadol  (Ultram )   **Note: The testing scope of this panel does not include the  following reported medications:   Albuterol (Ventolin HFA)  Alendronate (Fosamax)  Amlodipine  (Norvasc )  Baclofen  (Lioresal )  Carvedilol  (Coreg )  Dicyclomine  (Bentyl )  Duloxetine  (Cymbalta )  Empagliflozin (Jardiance)  Glucagon  Hydralazine (Apresoline)  Insulin  (NovoLog )  Losartan (Cozaar)  Magnesium   Meloxicam  (Mobic )  Metformin (Glucophage)  Nortriptyline (Pamelor)  Ondansetron  (Zofran )  Pantoprazole  (Protonix )  Polyethylene Glycol (MiraLAX )  Pravastatin (Pravachol)  Pregabalin (Lyrica)  Sucralfate (Carafate)  Vitamin D  ==================================================================== For clinical consultation, please call [phone]. ====================================================================      ROS  Constitutional: Denies any fever or chills Gastrointestinal: No reported hemesis, hematochezia, vomiting, or acute  GI distress Musculoskeletal: Right knee pain, low back pain Neurological: No reported episodes of acute onset apraxia, aphasia, dysarthria, agnosia, amnesia, paralysis, loss of coordination, or loss of consciousness  Medication Review  Cholecalciferol , DULoxetine , FreeStyle Libre Sensor System, Glucagon HCl (Diagnostic), Insulin  Aspart FlexPen, Magnesium , albuterol, alendronate, amLODipine , baclofen , carvedilol , dicyclomine , empagliflozin, hydrALAZINE, insulin  glargine (2 Unit Dial), insulin  lispro, losartan, meloxicam , metFORMIN, nortriptyline, ondansetron , pantoprazole , polyethylene glycol, pravastatin, pregabalin, sucralfate, and traMADol   History Review  Allergy: Ms. Alkins is allergic to semaglutide. Drug: Ms. Steinke  reports no history of drug use. Alcohol:  reports that she does not currently use alcohol. Tobacco:  reports that she has never smoked. She has never used smokeless tobacco. Social: Ms. Panjwani  reports that she has never smoked. She has never used smokeless tobacco. She reports that she does not currently use alcohol. She reports that she does not use drugs. Medical:  has a past medical history of Arthritis, Degenerative disc disease, lumbar, Diabetes mellitus without complication (HCC), GERD (gastroesophageal reflux disease), Hypertension, Neuropathy, Polyneuropathy, and Polyneuropathy. Surgical: Ms. Matar  has a past surgical history that includes arthroscopic rotator cuff; Colonoscopy; Abdominal hysterectomy; endosopic sinus; Cataract extraction w/PHACO (Right, 11/25/2017); and Eye surgery. Family: family history includes Alcohol abuse in her father; Breast cancer in her maternal aunt; Cancer in her father; Pancreatic cancer in her maternal aunt.  Laboratory Chemistry Profile   Renal Lab Results  Component Value Date   BUN 38 (H)  01/18/2023   CREATININE 2.10 (H) 01/18/2023   BCR 17 10/12/2017   GFRAA >60 12/23/2018   GFRNONAA 26 (L) 01/18/2023    Hepatic Lab Results   Component Value Date   AST 29 01/18/2023   ALT 21 01/18/2023   ALBUMIN 3.9 01/18/2023   ALKPHOS 94 01/18/2023   LIPASE 27 01/18/2023    Electrolytes Lab Results  Component Value Date   NA 136 01/18/2023   K 4.6 01/18/2023   CL 100 01/18/2023   CALCIUM  9.1 01/18/2023   MG 2.0 10/12/2017    Bone Lab Results  Component Value Date   25OHVITD1 17 (L) 10/12/2017   25OHVITD2 <1.0 10/12/2017   25OHVITD3 17 10/12/2017    Inflammation (CRP: Acute Phase) (ESR: Chronic Phase) Lab Results  Component Value Date   CRP 4 10/12/2017   ESRSEDRATE 12 10/12/2017         Note: Above Lab results reviewed.  Recent Imaging Review  US  PELVIC COMPLETE WITH TRANSVAGINAL CLINICAL DATA:  Left lower quadrant pain  EXAM: TRANSABDOMINAL AND TRANSVAGINAL ULTRASOUND OF PELVIS  TECHNIQUE: Both transabdominal and transvaginal ultrasound examinations of the pelvis were performed. Transabdominal technique was performed for global imaging of the pelvis including uterus, ovaries, adnexal regions, and pelvic cul-de-sac. It was necessary to proceed with endovaginal exam following the transabdominal exam to visualize the adnexa.  COMPARISON:  renal stone protocol CT 08/04/2022  FINDINGS: Uterus  Surgically absent  Right ovary  Not visualized  Left ovary  Not visualized  Other findings  No abnormal free fluid.  IMPRESSION: No significant sonographic abnormality of the pelvis. Nonvisualization of the ovaries.  Electronically Signed   By: Elester Grim M.D.   On: 05/15/2023 16:22 Note: Reviewed         Physical Exam  General appearance: Well nourished, well developed, and well hydrated. In no apparent acute distress Mental status: Alert, oriented x 3 (person, place, & time)       Respiratory: No evidence of acute respiratory distress Eyes: PERLA Vitals: BP (!) 164/92   Pulse 99   Temp (!) 97.4 F (36.3 C)   Resp 16   Ht 4\' 11"  (1.499 m)   Wt 150 lb (68 kg)   SpO2 99%   BMI  30.30 kg/m  BMI: Estimated body mass index is 30.3 kg/m as calculated from the following:   Height as of this encounter: 4\' 11"  (1.499 m).   Weight as of this encounter: 150 lb (68 kg). Ideal: Patient must be at least 60 in tall to calculate ideal body weight  Assessment   Diagnosis Status  1. Chronic pain syndrome   2. 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 Chronic ankle and foot pain (1ry area of Pain) (Left)   4. Lumbar facet syndrome   5. Chronic knee pain (4th area of Pain) (Bilateral) (L>R)   6. Chronic lower extremity pain (2ry area of Pain) (Bilateral) (L>R)   7. Chronic low back pain (Bilateral) w/o sciatica   8. 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 Pharmacologic therapy   10. Chronic use of opiate for therapeutic purpose   11. Encounter for medication management   12. Encounter for chronic pain management    Controlled Controlled Controlled   Updated Problems: No problems updated.  Plan of Care  Problem-specific:  Assessment and Plan We will continue on current medication regimen.  Prescribing drug monitoring (PDMP) reviewed; findings consistent with the use of prescribed medication and no evidence  of narcotic misuse or abuse.  No other issues or problems reported to this visit.  Schedule follow-up in 90 days.   Ms. PATRIECE ARCHBOLD has a current medication list which includes the following long-term medication(s): albuterol, amlodipine , baclofen , carvedilol , cholecalciferol , dicyclomine , duloxetine , hydralazine, toujeo  max solostar, losartan, magnesium , nortriptyline, pantoprazole , pravastatin, pregabalin, sucralfate, tramadol , and insulin  lispro.  Pharmacotherapy (Medications Ordered): No orders of the defined types were placed in this encounter.  Orders:  No orders of the defined types were placed in this encounter.       No follow-ups on file.    Recent Visits Date Type Provider Dept  06/18/23 Office Visit Renaldo Caroli, MD [facility]  05/11/23 Office Visit Shaily Librizzi K, NP [facility]  Showing recent visits within past 90 days and meeting all other requirements Today's Visits Date Type Provider Dept  06/24/23 Office Visit Alliah Boulanger K, NP [facility]  Showing today's visits and meeting all other requirements Future Appointments Date Type Provider Dept  06/30/23 Appointment Renaldo Caroli, MD [facility]  Showing future appointments within next 90 days and meeting all other requirements  I discussed the assessment and treatment plan with the patient. The patient was provided an opportunity to ask questions and all were answered. The patient agreed with the plan and demonstrated an understanding of the instructions.  Patient advised to call back or seek an in-person evaluation if the symptoms or condition worsens.  Duration of encounter: 30 minutes.  Total time on encounter, as per AMA guidelines included both the face-to-face and non-face-to-face time personally spent by the physician and/or other qualified health care professional(s) on the day of the encounter (includes time in activities that require the physician or other qualified health care professional and does not include time in activities normally performed by clinical staff). Physician's time may include the following activities when performed: Preparing to see the patient (e.g., pre-charting review of records, searching for previously ordered imaging, lab work, and nerve conduction tests) Review of prior analgesic pharmacotherapies. Reviewing PMP Interpreting ordered tests (e.g., lab work, imaging, nerve conduction tests) Performing post-procedure evaluations, including interpretation of diagnostic procedures Obtaining and/or reviewing separately obtained history Performing a medically appropriate examination and/or evaluation Counseling and educating the  patient/family/caregiver Ordering medications, tests, or procedures Referring and communicating with other health care professionals (when not separately reported) Documenting clinical information in the electronic or other health record Independently interpreting results (not separately reported) and communicating results to the patient/ family/caregiver Care coordination (not separately reported)  Note by: Conni Knighton K Ina Poupard, NP (TTS and AI technology used. I apologize for any typographical errors that were not detected and corrected.) Date: 06/24/2023; Time: 12:04 PM

## 2023-06-30 ENCOUNTER — Encounter: Payer: Self-pay | Admitting: Pain Medicine

## 2023-06-30 ENCOUNTER — Ambulatory Visit
Admission: RE | Admit: 2023-06-30 | Discharge: 2023-06-30 | Disposition: A | Discharge status: Home / Self Care | Source: Ambulatory Visit | Attending: Pain Medicine | Admitting: Pain Medicine

## 2023-06-30 ENCOUNTER — Ambulatory Visit: Attending: Pain Medicine | Admitting: Pain Medicine

## 2023-06-30 VITALS — BP 133/69 | HR 94 | Temp 97.3°F | Resp 17 | Ht 59.0 in | Wt 150.0 lb

## 2023-06-30 DIAGNOSIS — M1711 Unilateral primary osteoarthritis, right knee: Secondary | ICD-10-CM | POA: Insufficient documentation

## 2023-06-30 DIAGNOSIS — M4310 Spondylolisthesis, site unspecified: Secondary | ICD-10-CM | POA: Insufficient documentation

## 2023-06-30 DIAGNOSIS — M25561 Pain in right knee: Secondary | ICD-10-CM | POA: Insufficient documentation

## 2023-06-30 DIAGNOSIS — M4316 Spondylolisthesis, lumbar region: Secondary | ICD-10-CM | POA: Insufficient documentation

## 2023-06-30 DIAGNOSIS — M47816 Spondylosis without myelopathy or radiculopathy, lumbar region: Secondary | ICD-10-CM | POA: Diagnosis present

## 2023-06-30 DIAGNOSIS — M545 Low back pain, unspecified: Secondary | ICD-10-CM | POA: Insufficient documentation

## 2023-06-30 DIAGNOSIS — M5459 Other low back pain: Secondary | ICD-10-CM | POA: Insufficient documentation

## 2023-06-30 DIAGNOSIS — M47817 Spondylosis without myelopathy or radiculopathy, lumbosacral region: Secondary | ICD-10-CM | POA: Diagnosis present

## 2023-06-30 DIAGNOSIS — G8929 Other chronic pain: Secondary | ICD-10-CM | POA: Insufficient documentation

## 2023-06-30 MED ORDER — FENTANYL CITRATE (PF) 100 MCG/2ML IJ SOLN
INTRAMUSCULAR | Status: AC
Start: 2023-06-30 — End: ?
  Filled 2023-06-30: qty 2

## 2023-06-30 MED ORDER — PENTAFLUOROPROP-TETRAFLUOROETH EX AERO: INHALATION_SPRAY | Freq: Once | CUTANEOUS | Status: DC

## 2023-06-30 MED ORDER — ROPIVACAINE HCL 2 MG/ML IJ SOLN
INTRAMUSCULAR | Status: AC
  Filled 2023-06-30: qty 40

## 2023-06-30 MED ORDER — MIDAZOLAM HCL 5 MG/5ML IJ SOLN
0.5000 mg | Freq: Once | INTRAMUSCULAR | Status: AC
  Administered 2023-06-30: 2 mg via INTRAVENOUS

## 2023-06-30 MED ORDER — LIDOCAINE HCL 2 % IJ SOLN
INTRAMUSCULAR | Status: AC
Start: 2023-06-30 — End: ?
  Filled 2023-06-30: qty 20

## 2023-06-30 MED ORDER — PENTAFLUOROPROP-TETRAFLUOROETH EX AERO
INHALATION_SPRAY | Freq: Once | CUTANEOUS | Status: AC
  Administered 2023-06-30: 30 via TOPICAL

## 2023-06-30 MED ORDER — LIDOCAINE HCL 2 % IJ SOLN
20.0000 mL | Freq: Once | INTRAMUSCULAR | Status: AC
  Administered 2023-06-30: 400 mg

## 2023-06-30 MED ORDER — ROPIVACAINE HCL 2 MG/ML IJ SOLN
3.0000 mL | Freq: Once | INTRAMUSCULAR | Status: AC
  Administered 2023-06-30: 20 mL via INTRA_ARTICULAR

## 2023-06-30 MED ORDER — FENTANYL CITRATE (PF) 100 MCG/2ML IJ SOLN
25.0000 ug | INTRAMUSCULAR | Status: DC | PRN
  Administered 2023-06-30: 50 ug via INTRAVENOUS

## 2023-06-30 MED ORDER — SODIUM HYALURONATE (VISCOSUP) 16.8 MG/2ML IX SOSY
16.8000 mg | PREFILLED_SYRINGE | Freq: Once | INTRA_ARTICULAR | Status: AC
  Administered 2023-06-30: 16.8 mg via INTRA_ARTICULAR
  Filled 2023-06-30: qty 2

## 2023-06-30 MED ORDER — LIDOCAINE HCL (PF) 1 % IJ SOLN: 5.0000 mL | Freq: Once | INTRAMUSCULAR | Status: DC

## 2023-06-30 MED ORDER — MIDAZOLAM HCL 5 MG/5ML IJ SOLN
INTRAMUSCULAR | Status: AC
  Filled 2023-06-30: qty 5

## 2023-06-30 MED ORDER — LIDOCAINE HCL (PF) 2 % IJ SOLN: 5.0000 mL | Freq: Once | INTRAMUSCULAR | Status: DC

## 2023-06-30 MED ORDER — ROPIVACAINE HCL 2 MG/ML IJ SOLN
18.0000 mL | Freq: Once | INTRAMUSCULAR | Status: AC
  Administered 2023-06-30: 20 mL via PERINEURAL

## 2023-06-30 NOTE — Patient Instructions (Addendum)
 ______________________________________________________________________    Post-Procedure Discharge Instructions  Instructions: Apply ice:  Purpose: This will minimize any swelling and discomfort after procedure.  When: Day of procedure, as soon as you get home. How: Fill a plastic sandwich bag with crushed ice. Cover it with a small towel and apply to injection site. How long: (15 min on, 15 min off) Apply for 15 minutes then remove x 15 minutes.  Repeat sequence on day of procedure, until you go to bed. Apply heat:  Purpose: To treat any soreness and discomfort from the procedure. When: Starting the next day after the procedure. How: Apply heat to procedure site starting the day following the procedure. How long: May continue to repeat daily, until discomfort goes away. Food intake: Start with clear liquids (like water) and advance to regular food, as tolerated.  Physical activities: Keep activities to a minimum for the first 8 hours after the procedure. After that, then as tolerated. Driving: If you have received any sedation, be responsible and do not drive. You are not allowed to drive for 24 hours after having sedation. Blood thinner: (Applies only to those taking blood thinners) You may restart your blood thinner 6 hours after your procedure. Insulin : (Applies only to Diabetic patients taking insulin ) As soon as you can eat, you may resume your normal dosing schedule. Infection prevention: Keep procedure site clean and dry. Shower daily and clean area with soap and water. Post-procedure Pain Diary: Extremely important that this be done correctly and accurately. Recorded information will be used to determine the next step in treatment. For the purpose of accuracy, follow these rules: Evaluate only the area treated. Do not report or include pain from an untreated area. For the purpose of this evaluation, ignore all other areas of pain, except for the treated area. After your procedure,  avoid taking a long nap and attempting to complete the pain diary after you wake up. Instead, set your alarm clock to go off every hour, on the hour, for the initial 8 hours after the procedure. Document the duration of the numbing medicine, and the relief you are getting from it. Do not go to sleep and attempt to complete it later. It will not be accurate. If you received sedation, it is likely that you were given a medication that may cause amnesia. Because of this, completing the diary at a later time may cause the information to be inaccurate. This information is needed to plan your care. Follow-up appointment: Keep your post-procedure follow-up evaluation appointment after the procedure (usually 2 weeks for most procedures, 6 weeks for radiofrequencies). DO NOT FORGET to bring you pain diary with you.   Expect: (What should I expect to see with my procedure?) From numbing medicine (AKA: Local Anesthetics): Numbness or decrease in pain. You may also experience some weakness, which if present, could last for the duration of the local anesthetic. Onset: Full effect within 15 minutes of injected. Duration: It will depend on the type of local anesthetic used. On the average, 1 to 8 hours.  From steroids None used From procedure: Some discomfort is to be expected once the numbing medicine wears off. This should be minimal if ice and heat are applied as instructed.  Call if: (When should I call?) You experience numbness and weakness that gets worse with time, as opposed to wearing off. New onset bowel or bladder incontinence. (Applies only to procedures done in the spine)  Emergency Numbers: Durning business hours (Monday - Thursday, 8:00 AM -  4:00 PM) (Friday, 9:00 AM - 12:00 Noon): [phone] After hours: [phone] NOTE: If you are having a problem and are unable connect with, or to talk to a provider, then go to your nearest urgent care or emergency department. If the problem is serious  and urgent, please call 911. ______________________________________________________________________     ______________________________________________________________________    Procedure instructions  Stop blood-thinners  Do not eat or drink fluids (other than water) for 6 hours before your procedure  No water for 2 hours before your procedure  Take your blood pressure medicine with a sip of water  Arrive 30 minutes before your appointment  If sedation is planned, bring suitable driver. Teddie Favre, [place], & public transportation are NOT APPROVED)  Carefully read the "Preparing for your procedure" detailed instructions  If you have questions call us  at [phone]  Procedure appointments are for procedures only.   NO medication refills or new problem evaluations will be done on procedure days.   Only the scheduled, pre-approved procedure and side will be done.   ______________________________________________________________________      ______________________________________________________________________    Preparing for your procedure  Appointments: If you think you may not be able to keep your appointment, call 24-48 hours in advance to cancel. We need time to make it available to others.  Procedure visits are for procedures only. During your procedure appointment there will be: NO Prescription Refills*. NO medication changes or discussions*. NO discussion of disability issues*. NO unrelated pain problem evaluations*. NO evaluations to order other pain procedures*. *These will be addressed at a separate and distinct evaluation encounter on the provider's evaluation schedule and not during procedure days.  Instructions: Food intake: Avoid eating anything solid for at least 8 hours prior to your procedure. Clear liquid intake: You may take clear liquids such as water up to 2 hours prior to your procedure. (No carbonated drinks. No soda.) Transportation: Unless  otherwise stated by your physician, bring a driver. (Driver cannot be a Market researcher, Pharmacist, community, or any other form of public transportation.) Morning Medicines: Except for blood thinners, take all of your other morning medications with a sip of water. Make sure to take your heart and blood pressure medicines. If your blood pressure's lower number is above 100, the case will be rescheduled. Blood thinners: Make sure to stop your blood thinners as instructed.  If you take a blood thinner, but were not instructed to stop it, call our office [phone] and ask to talk to a nurse. Not stopping a blood thinner prior to certain procedures could lead to serious complications. Diabetics on insulin : Notify the staff so that you can be scheduled 1st case in the morning. If your diabetes requires high dose insulin , take only  of your normal insulin  dose the morning of the procedure and notify the staff that you have done so. Preventing infections: Shower with an antibacterial soap the morning of your procedure.  Build-up your immune system: Take 1000 mg of Vitamin C with every meal (3 times a day) the day prior to your procedure. Antibiotics: Inform the nursing staff if you are taking any antibiotics or if you have any conditions that may require antibiotics prior to procedures. (Example: recent joint implants)   Pregnancy: If you are pregnant make sure to notify the nursing staff. Not doing so may result in injury to the fetus, including death.  Sickness: If you have a cold, fever, or any active infections, call and cancel or reschedule your procedure. Receiving  steroids while having an infection may result in complications. Arrival: You must be in the facility at least 30 minutes prior to your scheduled procedure. Tardiness: Your scheduled time is also the cutoff time. If you do not arrive at least 15 minutes prior to your procedure, you will be rescheduled.  Children: Do not bring any children with you. Make arrangements  to keep them home. Dress appropriately: There is always a possibility that your clothing may get soiled. Avoid long dresses. Valuables: Do not bring any jewelry or valuables.  Reasons to call and reschedule or cancel your procedure: (Following these recommendations will minimize the risk of a serious complication.) Surgeries: Avoid having procedures within 2 weeks of any surgery. (Avoid for 2 weeks before or after any surgery). Flu Shots: Avoid having procedures within 2 weeks of a flu shots or . (Avoid for 2 weeks before or after immunizations). Barium: Avoid having a procedure within 7-10 days after having had a radiological study involving the use of radiological contrast. (Myelograms, Barium swallow or enema study). Heart attacks: Avoid any elective procedures or surgeries for the initial 6 months after a "Myocardial Infarction" (Heart Attack). Blood thinners: It is imperative that you stop these medications before procedures. Let us  know if you if you take any blood thinner.  Infection: Avoid procedures during or within two weeks of an infection (including chest colds or gastrointestinal problems). Symptoms associated with infections include: Localized redness, fever, chills, night sweats or profuse sweating, burning sensation when voiding, cough, congestion, stuffiness, runny nose, sore throat, diarrhea, nausea, vomiting, cold or Flu symptoms, recent or current infections. It is specially important if the infection is over the area that we intend to treat. Heart and lung problems: Symptoms that may suggest an active cardiopulmonary problem include: cough, chest pain, breathing difficulties or shortness of breath, dizziness, ankle swelling, uncontrolled high or unusually low blood pressure, and/or palpitations. If you are experiencing any of these symptoms, cancel your procedure and contact your primary care physician for an evaluation.  Remember:  Regular Business hours are:  Monday to Thursday  8:00 AM to 4:00 PM  Provider's Schedule: Renaldo Caroli, MD:  Procedure days: Tuesday and Thursday 7:30 AM to 4:00 PM  Cephus Collin, MD:  Procedure days: Monday and Wednesday 7:30 AM to 4:00 PM Last  Updated: 12/30/2022 ______________________________________________________________________      ______________________________________________________________________    General Risks and Possible Complications  Patient Responsibilities: It is important that you read this as it is part of your informed consent. It is our duty to inform you of the risks and possible complications associated with treatments offered to you. It is your responsibility as a patient to read this and to ask questions about anything that is not clear or that you believe was not covered in this document.  Patient's Rights: You have the right to refuse treatment. You also have the right to change your mind, even after initially having agreed to have the treatment done. However, under this last option, if you wait until the last second to change your mind, you may be charged for the materials used up to that point.  Introduction: Medicine is not an Visual merchandiser. Everything in Medicine, including the lack of treatment(s), carries the potential for danger, harm, or loss (which is by definition: Risk). In Medicine, a complication is a secondary problem, condition, or disease that can aggravate an already existing one. All treatments carry the risk of possible complications. The fact that a side effects or complications  occurs, does not imply that the treatment was conducted incorrectly. It must be clearly understood that these can happen even when everything is done following the highest safety standards.  No treatment: You can choose not to proceed with the proposed treatment alternative. The "PRO(s)" would include: avoiding the risk of complications associated with the therapy. The "CON(s)" would include: not getting any of  the treatment benefits. These benefits fall under one of three categories: diagnostic; therapeutic; and/or palliative. Diagnostic benefits include: getting information which can ultimately lead to improvement of the disease or symptom(s). Therapeutic benefits are those associated with the successful treatment of the disease. Finally, palliative benefits are those related to the decrease of the primary symptoms, without necessarily curing the condition (example: decreasing the pain from a flare-up of a chronic condition, such as incurable terminal cancer).  General Risks and Complications: These are associated to most interventional treatments. They can occur alone, or in combination. They fall under one of the following six (6) categories: no benefit or worsening of symptoms; bleeding; infection; nerve damage; allergic reactions; and/or death. No benefits or worsening of symptoms: In Medicine there are no guarantees, only probabilities. No healthcare provider can ever guarantee that a medical treatment will work, they can only state the probability that it may. Furthermore, there is always the possibility that the condition may worsen, either directly, or indirectly, as a consequence of the treatment. Bleeding: This is more common if the patient is taking a blood thinner, either prescription or over the counter (example: Goody Powders, Fish oil, Aspirin, Garlic, etc.), or if suffering a condition associated with impaired coagulation (example: Hemophilia, cirrhosis of the liver, low platelet counts, etc.). However, even if you do not have one on these, it can still happen. If you have any of these conditions, or take one of these drugs, make sure to notify your treating physician. Infection: This is more common in patients with a compromised immune system, either due to disease (example: diabetes, cancer, human immunodeficiency virus [HIV], etc.), or due to medications or treatments (example: therapies used to  treat cancer and rheumatological diseases). However, even if you do not have one on these, it can still happen. If you have any of these conditions, or take one of these drugs, make sure to notify your treating physician. Nerve Damage: This is more common when the treatment is an invasive one, but it can also happen with the use of medications, such as those used in the treatment of cancer. The damage can occur to small secondary nerves, or to large primary ones, such as those in the spinal cord and brain. This damage may be temporary or permanent and it may lead to impairments that can range from temporary numbness to permanent paralysis and/or brain death. Allergic Reactions: Any time a substance or material comes in contact with our body, there is the possibility of an allergic reaction. These can range from a mild skin rash (contact dermatitis) to a severe systemic reaction (anaphylactic reaction), which can result in death. Death: In general, any medical intervention can result in death, most of the time due to an unforeseen complication. ______________________________________________________________________    Moderate Conscious Sedation, Adult, Care After After the procedure, it is common to have: Sleepiness for a few hours. Impaired judgment for a few hours. Trouble with balance. Nausea or vomiting if you eat too soon. Follow these instructions at home: For the time period you were told by your health care provider:  Rest. Do not participate in  activities where you could fall or become injured. Do not drive or use machinery. Do not drink alcohol. Do not take sleeping pills or medicines that cause drowsiness. Do not make important decisions or sign legal documents. Do not take care of children on your own. Eating and drinking Follow instructions from your health care provider about what you may eat and drink. Drink enough fluid to keep your urine pale yellow. If you vomit: Drink  clear fluids slowly and in small amounts as you are able. Clear fluids include water, ice chips, low-calorie sports drinks, and fruit juice that has water added to it (diluted fruit juice). Eat light and bland foods in small amounts as you are able. These foods include bananas, applesauce, rice, lean meats, toast, and crackers. General instructions Take over-the-counter and prescription medicines only as told by your health care provider. Have a responsible adult stay with you for the time you are told. Do not use any products that contain nicotine or tobacco. These products include cigarettes, chewing tobacco, and vaping devices, such as e-cigarettes. If you need help quitting, ask your health care provider. Return to your normal activities as told by your health care provider. Ask your health care provider what activities are safe for you. Your health care provider may give you more instructions. Make sure you know what you can and cannot do. Contact a health care provider if: You are still sleepy or having trouble with balance after 24 hours. You feel light-headed. You vomit every time you eat or drink. You get a rash. You have a fever. You have redness or swelling around the IV site. Get help right away if: You have trouble breathing. You start to feel confused at home. These symptoms may be an emergency. Get help right away. Call 911. Do not wait to see if the symptoms will go away. Do not drive yourself to the hospital. This information is not intended to replace advice given to you by your health care provider. Make sure you discuss any questions you have with your health care provider. Document Revised: 07/22/2021 Document Reviewed: 07/22/2021 Elsevier Patient Education  2024 [employer].

## 2023-06-30 NOTE — Progress Notes (Signed)
 PROVIDER NOTE: Interpretation of information contained herein should be left to medically-trained personnel. Specific patient instructions are provided elsewhere under "Patient Instructions" section of medical record. This document was created in part using STT-dictation technology, any transcriptional errors that may result from this process are unintentional.  Patient: Charlene [name] Type: Established DOB: [id number] MRN: [id number] PCP: Antonio Baumgarten, MD  Service: Procedure DOS: 06/30/2023 Setting: Ambulatory Location: Ambulatory outpatient facility Delivery: Face-to-face Provider: Candi Chafe, MD Specialty: Interventional Pain Management Specialty designation: 09 Location: Outpatient facility Ref. Prov.: Antonio Baumgarten, MD       Interventional Therapy   Procedure No.1:  Gelsyn-3 Intra-articular Knee Injection #1  Laterality: Right (-RT) Level/approach: Lateral Imaging guidance: None required ([id number]) Anesthesia: Local anesthesia (1-2% Lidocaine ) Anxiolysis: IV Versed  2.0 mg Sedation: Moderate Sedation Fentanyl  1 mL (50 mcg) DOS: 06/30/2023  Performed by: Candi Chafe, MD  Purpose: Diagnostic/Therapeutic Indications: Knee arthralgia associated to osteoarthritis of the knee 1. Chronic knee pain (Right)   2. Osteoarthritis of knee (Right)    NAS-11 score:   Pre-procedure: 1 /10   Post-procedure: 0-No pain/10   Location setting: Procedure suite Position: Sitting w/ knee bent 90 degrees Safety Precautions: Patient was assessed for positional comfort and pressure points before starting the procedure. Prepping solution: DuraPrep (Iodine Povacrylex [0.7% available iodine] and Isopropyl Alcohol, 74% w/w) Prep Area: Entire knee region Approach: percutaneous, just above the tibial plateau, medial to the infrapatellar tendon. Intended target: Intra-articular knee space  Procedure No.2: Lumbar Facet, Medial Branch Block(s) (w/o STEROIDS) R5L2  Laterality:  Bilateral  Level: L2, L3, L4, L5, and S1 Medial Branch/Dorsal Rami Level(s). Injecting these levels blocks the L3-4, L4-5, and L5-S1 lumbar facet joints. Imaging: Fluoroscopic guidance Spinal ([id number]) Anesthesia: Local anesthesia (1-2% Lidocaine ) Anxiolysis: IV Versed          Sedation: Moderate Sedation                       DOS: 06/30/2023 Performed by: Candi Chafe, MD  Primary Purpose: Diagnostic/Therapeutic Indications: Low back pain severe enough to impact quality of life or function. 1. Chronic low back pain (Bilateral) w/o sciatica   2. Arthropathy of lumbar facet joint   3. 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 Grade 1 Retrolisthesis of L2/L3   5. Lumbar facet joint pain   6. Lumbar facet hypertrophy   7. Lumbar facet arthropathy   8. Lumbar facet syndrome   9. Spondylosis without myelopathy or radiculopathy, lumbosacral region   10. Low back pain of over 3 months duration   11. Intermittent low back pain   12. Mechanical low back pain   13. Multifactorial low back pain    NAS-11 Pain score:   Pre-procedure: 1 /10   Post-procedure: 0-No pain/10     Position / Prep / Materials:  Procedure No.1:  Materials: Tray: Block Needle(s): Regular Qty: 1/side Length: 1.5-inch Gauge: 25G (x1) + 22G (x1)  Procedure No.2:  Position: Prone  Prep solution: ChloraPrep (2% chlorhexidine gluconate and 70% isopropyl alcohol) Area Prepped: Posterolateral Lumbosacral Spine (Wide prep: From the lower border of the scapula down to the end of the tailbone and from flank to flank.)  Materials:  Tray: Block Needle(s):  Type: Spinal  Gauge (G): 22  Length: 3.5-in Qty: 4     H&P (Pre-op Assessment):  Charlene [name] is a 63 y.o. (year old), female patient, seen today for interventional treatment. She  has a past surgical history that includes  arthroscopic rotator cuff; Colonoscopy; Abdominal hysterectomy; endosopic sinus; Cataract extraction w/PHACO (Right,  11/25/2017); and Eye surgery. Charlene [name] has a current medication list which includes the following prescription(s): albuterol, alendronate, amlodipine , carvedilol , cholecalciferol , freestyle libre sensor system, dicyclomine , duloxetine , empagliflozin, glucagon hcl (diagnostic), hydralazine, insulin  aspart flexpen, toujeo  max solostar, insulin  lispro, losartan, meloxicam , metformin, nortriptyline, ondansetron , pantoprazole , polyethylene glycol, pravastatin, pregabalin, sucralfate, tramadol , baclofen , and magnesium , and the following Facility-Administered Medications: fentanyl , lidocaine  (pf), lidocaine  hcl (pf), and pentafluoroprop-tetrafluoroeth. Her primarily concern today is the Back Pain (lower)  Initial Vital Signs:  Pulse/HCG Rate: 94ECG Heart Rate: 85 Temp: 98.6 F (37 C) Resp: 16 BP: (!) 141/82 SpO2: 98 %  BMI: Estimated body mass index is 30.3 kg/m as calculated from the following:   Height as of this encounter: 4\' 11"  (1.499 m).   Weight as of this encounter: 150 lb (68 kg).  Risk Assessment: Allergies: Reviewed. She is allergic to semaglutide.  Allergy Precautions: None required Coagulopathies: Reviewed. None identified.  Blood-thinner therapy: None at this time Active Infection(s): Reviewed. None identified. Charlene [name] is afebrile  Site Confirmation: Charlene [name] was asked to confirm the procedure and laterality before marking the site Procedure checklist: Completed Consent: Before the procedure and under the influence of no sedative(s), amnesic(s), or anxiolytics, the patient was informed of the treatment options, risks and possible complications. To fulfill our ethical and legal obligations, as recommended by the American Medical Association's Code of Ethics, I have informed the patient of my clinical impression; the nature and purpose of the treatment or procedure; the risks, benefits, and possible complications of the intervention; the alternatives, including doing nothing; the  risk(s) and benefit(s) of the alternative treatment(s) or procedure(s); and the risk(s) and benefit(s) of doing nothing. The patient was provided information about the general risks and possible complications associated with the procedure. These may include, but are not limited to: failure to achieve desired goals, infection, bleeding, organ or nerve damage, allergic reactions, paralysis, and death. In addition, the patient was informed of those risks and complications associated to Spine-related procedures, such as failure to decrease pain; infection (i.e.: Meningitis, epidural or intraspinal abscess); bleeding (i.e.: epidural hematoma, subarachnoid hemorrhage, or any other type of intraspinal or peri-dural bleeding); organ or nerve damage (i.e.: Any type of peripheral nerve, nerve root, or spinal cord injury) with subsequent damage to sensory, motor, and/or autonomic systems, resulting in permanent pain, numbness, and/or weakness of one or several areas of the body; allergic reactions; (i.e.: anaphylactic reaction); and/or death. Furthermore, the patient was informed of those risks and complications associated with the medications. These include, but are not limited to: allergic reactions (i.e.: anaphylactic or anaphylactoid reaction(s)); adrenal axis suppression; blood sugar elevation that in diabetics may result in ketoacidosis or comma; water retention that in patients with history of congestive heart failure may result in shortness of breath, pulmonary edema, and decompensation with resultant heart failure; weight gain; swelling or edema; medication-induced neural toxicity; particulate matter embolism and blood vessel occlusion with resultant organ, and/or nervous system infarction; and/or aseptic necrosis of one or more joints. Finally, the patient was informed that Medicine is not an exact science; therefore, there is also the possibility of unforeseen or unpredictable risks and/or possible complications  that may result in a catastrophic outcome. The patient indicated having understood very clearly. We have given the patient no guarantees and we have made no promises. Enough time was given to the patient to ask questions, all of which were answered to the patient's satisfaction.  Charlene [name] has indicated that she wanted to continue with the procedure. Attestation: I, the ordering provider, attest that I have discussed with the patient the benefits, risks, side-effects, alternatives, likelihood of achieving goals, and potential problems during recovery for the procedure that I have provided informed consent. Date  Time: 06/30/2023 11:24 AM  Pre-Procedure Preparation:  Monitoring: As per clinic protocol. Respiration, ETCO2, SpO2, BP, heart rate and rhythm monitor placed and checked for adequate function Safety Precautions: Patient was assessed for positional comfort and pressure points before starting the procedure. Time-out: I initiated and conducted the "Time-out" before starting the procedure, as per protocol. The patient was asked to participate by confirming the accuracy of the "Time Out" information. Verification of the correct person, site, and procedure were performed and confirmed by me, the nursing staff, and the patient. "Time-out" conducted as per Joint Commission's Universal Protocol (UP.01.01.01). Time: 1252 Start Time: 1258 hrs.  Description of procedure No.1:  Start Time: 1258 hrs  Local Anesthesia: Once the patient was positioned, prepped, and time-out was completed. The target area was identified located. The skin was marked with an approved surgical skin marker. Once marked, the skin (epidermis, dermis, and hypodermis), and deeper tissues (fat, connective tissue and muscle) were infiltrated with a small amount of a short-acting local anesthetic, loaded on a 10cc syringe with a 25G, 1.5-in  Needle. An appropriate amount of time was allowed for local anesthetics to take effect before  proceeding to the next step. Local Anesthetic: Lidocaine  1-2% The unused portion of the local anesthetic was discarded in the proper designated containers. Safety Precautions: Aspiration looking for blood return was conducted prior to all injections. At no point did I inject any substances, as a needle was being advanced. Before injecting, the patient was told to immediately notify me if she was experiencing any new onset of "ringing in the ears, or metallic taste in the mouth". No attempts were made at seeking any paresthesias. Safe injection practices and needle disposal techniques used. Medications properly checked for expiration dates. SDV (single dose vial) medications used. After the completion of the procedure, all disposable equipment used was discarded in the proper designated medical waste containers.  Technical description: Protocol guidelines were followed. After positioning, the target area was identified and prepped in the usual manner. Skin & deeper tissues infiltrated with local anesthetic. Appropriate amount of time allowed to pass for local anesthetics to take effect. Proper needle placement secured. Once satisfactory needle placement was confirmed, I proceeded to inject the desired solution in slow, incremental fashion, intermittently assessing for discomfort or any signs of abnormal or undesired spread of substance. Once completed, the needle was removed and disposed of, as per hospital protocols. The area was cleaned, making sure to leave some of the prepping solution back to take advantage of its long term bactericidal properties.  Aspiration:  Negative        Imaging guidance for Procedure No.1:  Imaging-assisted Technique: None required. Indication(s): N/A Exposure Time: N/A Contrast: None Fluoroscopic Guidance: N/A Ultrasound Guidance: N/A Interpretation: N/A   Description of Procedure No.2:          Laterality: (see above) Targeted Levels: (see above)  Safety  Precautions: Aspiration looking for blood return was conducted prior to all injections. At no point did we inject any substances, as a needle was being advanced. Before injecting, the patient was told to immediately notify me if she was experiencing any new onset of "ringing in the ears, or metallic taste in the  mouth". No attempts were made at seeking any paresthesias. Safe injection practices and needle disposal techniques used. Medications properly checked for expiration dates. SDV (single dose vial) medications used. After the completion of the procedure, all disposable equipment used was discarded in the proper designated medical waste containers. Local Anesthesia: Protocol guidelines were followed. The patient was positioned over the fluoroscopy table. The area was prepped in the usual manner. The time-out was completed. The target area was identified using fluoroscopy. A 12-in long, straight, sterile hemostat was used with fluoroscopic guidance to locate the targets for each level blocked. Once located, the skin was marked with an approved surgical skin marker. Once all sites were marked, the skin (epidermis, dermis, and hypodermis), as well as deeper tissues (fat, connective tissue and muscle) were infiltrated with a small amount of a short-acting local anesthetic, loaded on a 10cc syringe with a 25G, 1.5-in  Needle. An appropriate amount of time was allowed for local anesthetics to take effect before proceeding to the next step. Local Anesthetic: Lidocaine  2.0% The unused portion of the local anesthetic was discarded in the proper designated containers. Technical description of process:  Medial Branch  Dorsal Rami Nerve Block (MBB):  Neuroanatomy note: Each lumbar facet joint receives dual innervation from medial branches arising from the posterior primary rami at the same level and one level above. The target for each lumbar medial branch is the junction of the ipsilateral superior articular and  transverse process of the lower vertebral body. (i.e.: The L4-L5 facet joint is innervated by the L4 medial branch [located at L5] and the L3 medial branch [located at L4]. Blocking the L4 Medial Branch is therefore achieved by injecting at the junction of the ipsilateral superior articular and transverse process of the lower vertebral body [L5].).  Exception: The exception to the above rule is the L5-S1 facet joint which has triple innervation requiring the L4 medial branch, as well as the L5 and the S1 Dorsal Rami(s) to be blocked to fully denervate the joint.  Under fluoroscopic guidance, a needle was inserted until contact was made with os over the target area. After negative aspiration, 0.5 mL of the nerve block solution was injected without difficulty or complication. Paresthesia were avoided during injection. The needle(s) were removed intact and without complication.  Once the entire procedure was completed, the treated area was cleaned, making sure to leave some of the prepping solution back to take advantage of its long term bactericidal properties.         Illustration of the posterior view of the lumbar spine and the posterior neural structures. Laminae of L2 through S1 are labeled. DPRL5, dorsal primary ramus of L5; DPRS1, dorsal primary ramus of S1; DPR3, dorsal primary ramus of L3; FJ, facet (zygapophyseal) joint L3-L4; I, inferior articular process of L4; LB1, lateral branch of dorsal primary ramus of L1; IAB, inferior articular branches from L3 medial branch (supplies L4-L5 facet joint); IBP, intermediate branch plexus; MB3, medial branch of dorsal primary ramus of L3; NR3, third lumbar nerve root; S, superior articular process of L5; SAB, superior articular branches from L4 (supplies L4-5 facet joint also); TP3, transverse process of L3.   Facet Joint Innervation (* possible contribution)  L1-2 T12, L1 (L2*)  Medial Branch  L2-3 L1, L2 (L3*)         "          "  L3-4 L2, L3 (L4*)          "          "  L4-5 L3, L4 (L5*)         "          "  L5-S1 L4, L5, S1          "          "    Vitals:   06/30/23 1318 06/30/23 1328 06/30/23 1333 06/30/23 1338  BP: 122/76 (!) 151/124 123/71 133/69  Pulse:      Resp: 11 13 16 17   Temp: (!) 97.1 F (36.2 C)   (!) 97.3 F (36.3 C)  TempSrc: Temporal   Temporal  SpO2: 96% 96% 98% 96%  Weight:      Height:         End Time: 1308 hrs.  Imaging guidance for Procedure No.2: Type of Imaging Technique: Fluoroscopy Guidance (Spinal) Indication(s): Fluoroscopy guidance for needle placement to enhance accuracy in procedures requiring precise needle localization for targeted delivery of medication in or near specific anatomical locations not easily accessible without such real-time imaging assistance. Exposure Time: Please see nurses notes. Contrast: None used. Fluoroscopic Guidance: I was personally present during the use of fluoroscopy. "Tunnel Vision Technique" used to obtain the best possible view of the target area. Parallax error corrected before commencing the procedure. "Direction-depth-direction" technique used to introduce the needle under continuous pulsed fluoroscopy. Once target was reached, antero-posterior, oblique, and lateral fluoroscopic projection used confirm needle placement in all planes. Images permanently stored in EMR. Interpretation: No contrast injected. I personally interpreted the imaging intraoperatively. Adequate needle placement confirmed in multiple planes. Permanent images saved into the patient's record.  Post-operative Assessment:  Post-procedure Vital Signs:  Pulse/HCG Rate: 9485 Temp: (!) 97.3 F (36.3 C) Resp: 17 BP: 133/69 SpO2: 96 %  EBL: None  Complications: No immediate post-treatment complications observed by team, or reported by patient.  Note: The patient tolerated the entire procedure well. A repeat set of vitals were taken after the procedure and the patient was kept under  observation following institutional policy, for this type of procedure. Post-procedural neurological assessment was performed, showing return to baseline, prior to discharge. The patient was provided with post-procedure discharge instructions, including a section on how to identify potential problems. Should any problems arise concerning this procedure, the patient was given instructions to immediately contact us , at any time, without hesitation. In any case, we plan to contact the patient by telephone for a follow-up status report regarding this interventional procedure.  Comments:  No additional relevant information.  Plan of Care (POC)  Orders:  Orders Placed This Encounter  Procedures   LUMBAR FACET(MEDIAL BRANCH NERVE BLOCK) MBNB    Scheduling Instructions:     Procedure: Lumbar facet block (AKA.: Lumbosacral medial branch nerve block)     Side: Bilateral     Level: L3-4, L4-5, and L5-S1 Facets (L2, L3, L4, L5, and S1 Medial Branch Nerves)     Sedation: Patient's choice.     Date: 06/30/2023    Where will this procedure be performed?:   [facility]   KNEE INJECTION    Indications: Knee arthralgia (pain) due to osteoarthritis (OA) Imaging: None (CPT-20610) Position: Sitting Equipment/Materials: Block tray  1.5", 25-G (one per side)  Local anesthetic  Monovisc (one per side) Confirm availability (in office) of Monovisc (HMW hyaluronan)    Scheduling Instructions:     Procedure: Knee injection Monovisc (Hyaluronan/Hyaluronic acid)     Treatment No.: 1     Level: Intra-articular     Laterality: Right Knee  Sedation: Patient's choice.     Date: 06/30/2023    Where will this procedure be performed?:   [facility]   DG PAIN [facility] C-ARM 1-60 MIN NO REPORT    Intraoperative interpretation by procedural physician at [facility].    Standing Status:   Standing    Number of Occurrences:   1    Reason for exam::   Assistance in needle guidance and placement  for procedures requiring needle placement in or near specific anatomical locations not easily accessible without such assistance.   Informed Consent Details: Physician/Practitioner Attestation; Transcribe to consent form and obtain patient signature    Nursing Order: Transcribe to consent form and obtain patient signature. Note: Always confirm laterality of pain with Ms. Joline Ned, before procedure.    Physician/Practitioner attestation of informed consent for procedure/surgical case:   I, the physician/practitioner, attest that I have discussed with the patient the benefits, risks, side effects, alternatives, likelihood of achieving goals and potential problems during recovery for the procedure that I have provided informed consent.    Procedure:   Lumbar Facet Block  under fluoroscopic guidance    Physician/Practitioner performing the procedure:   Taronda Comacho A. Barth Borne MD    Indication/Reason:   Low Back Pain, with our without leg pain, due to Facet Joint Arthralgia (Joint Pain) Spondylosis (Arthritis of the Spine), without myelopathy or radiculopathy (Nerve Damage).   Provide equipment / supplies at bedside    Procedure tray: "Block Tray" (Disposable  single use) Skin infiltration needle: Regular 1.5-in, 25-G, (x1) Block Needle type: Spinal Amount/quantity: 4 Size: Regular (3.5-inch) Gauge: 22G    Standing Status:   Standing    Number of Occurrences:   1    Specify:   Block Tray   Informed Consent Details: Physician/Practitioner Attestation; Transcribe to consent form and obtain patient signature    Nursing Order: Transcribe to consent form and obtain patient signature. Note: Always confirm laterality of pain with Ms. Joline Ned, before procedure.    Physician/Practitioner attestation of informed consent for procedure/surgical case:   I, the physician/practitioner, attest that I have discussed with the patient the benefits, risks, side effects, alternatives, likelihood of achieving goals and  potential problems during recovery for the procedure that I have provided informed consent.    Procedure:   Therapeutic intra-articular viscosupplementation knee injection    Physician/Practitioner performing the procedure:   Gaetan Spieker A. Barth Borne, MD    Indication/Reason:   Chronic knee pain secondary to primary osteoarthritis of the knee  (Right-M17.11), (Left-M17.12) or (Bilateral-M17.0)   Provide equipment / supplies at bedside    Procedure tray: "Block Tray" (Disposable  single use) Skin infiltration needle: Regular 1.5-in, 25-G, (x1) Block Needle type: Regular Amount/quantity: 1 Size: Short(1.5-inch) Gauge: (25G x1) + (22G x1)    Standing Status:   Standing    Number of Occurrences:   1    Specify:   Block Tray   Saline lock IV    Have LR [phone] available and administer at 125 mL/hr if patient becomes hypotensive.    Standing Status:   Standing    Number of Occurrences:   1     Analgesic: Tramadol  50 mg, 1 tab PO QD. ABNORMAL UDS (09/03/2020) (+) for COCAINE. MME: 5 mg/day    Medications ordered for procedure: Meds ordered this encounter  Medications   lidocaine  (XYLOCAINE ) 2 % (with pres) injection 400 mg   pentafluoroprop-tetrafluoroeth (GEBAUERS) aerosol   midazolam  (VERSED ) 5 MG/5ML injection 0.5-2 mg  Make sure Flumazenil is available in the pyxis when using this medication. If oversedation occurs, administer 0.2 mg IV over 15 sec. If after 45 sec no response, administer 0.2 mg again over 1 min; may repeat at 1 min intervals; not to exceed 4 doses (1 mg)   fentaNYL  (SUBLIMAZE ) injection 25-50 mcg    Make sure Narcan  is available in the pyxis when using this medication. In the event of respiratory depression (RR< 8/min): Titrate NARCAN  (naloxone ) in increments of 0.1 to 0.2 mg IV at 2-3 minute intervals, until desired degree of reversal.   ropivacaine  (PF) 2 mg/mL (0.2%) (NAROPIN ) injection 18 mL   pentafluoroprop-tetrafluoroeth (GEBAUERS) aerosol   lidocaine  HCl  (PF) (XYLOCAINE ) 2 % injection 5 mL   ropivacaine  (PF) 2 mg/mL (0.2%) (NAROPIN ) injection 3 mL   lidocaine  (PF) (XYLOCAINE ) 1 % injection 5 mL   sodium hyaluronate (viscosup) (GELSYN-3) intra-articular injection 16.8 mg    Do not substitute. Deliver to facility day before procedure.   Medications administered: We administered lidocaine , midazolam , fentaNYL , ropivacaine  (PF) 2 mg/mL (0.2%), pentafluoroprop-tetrafluoroeth, ropivacaine  (PF) 2 mg/mL (0.2%), and sodium hyaluronate (viscosup).  See the medical record for exact dosing, route, and time of administration.    Interventional Therapies  Risk Factors  Considerations:   NOTE: UNCONTROLLED IDDM (NO STEROIDS)  ABNORMAL UDS (09/03/2020) (+) for COCAINE       Planned  Pending:  (NO STEROIDS)  Diagnostic bilateral L4-5 and L5-S1 lumbar facet MBB #1 (no steroids)    Under consideration:   Diagnostic L4-5 and L5-S1 lumbar facet MBB (no steroids)  Diagnostic left genicular nerve block #1 (no steroids) (to be done after lumbar facet block.) Possible bilateral lumbar facet RFA  Diagnostic bilateral genicular NB    Completed:   Diagnostic right lumbar facet MBB x4 (no steroid) (05/13/2022) (100/100/75/75)  Diagnostic left lumbar facet MBB x1 (w/ steroid) (10/09/2020) (100/100/90/L-100; R-50)  Therapeutic left L3-4 LESI x1 (02/11/2022) (100/100/75/100)  Therapeutic left L4-5 LESI x4 (09/21/2018) (100/100/50/70)  Therapeutic left L5 TFESI x4 (09/21/2018) (100/100/50/70)  Therapeutic left L5-S1 LESI x2 (02/25/2018) (100/100/100/100)  Therapeutic bilateral Hyalgan knee inj. x5 (02/25/2018) (100/100/100 x2 weeks/<50)  Therapeutic left IA steroid knee inj. x1 (11/23/2017) (100/100/0/0)  Therapeutic left IA Gelsyn-3 knee inj. x3 (02/11/2022) (100/100/75/100) Therapeutic left IA Monovisc knee inj. x1 (03/18/2022) (100/100/50/50)    Therapeutic  Palliative (PRN) options:  (NO PRN PROCEDURES W/O MD F2F EVALUATION) Palliative L5 TFESI  Palliative  L4-5 LESI   Palliative L5-S1 LESI   Palliative Hyalgan knee inj.      Follow-up plan:   Return in about 2 weeks (around 07/14/2023) for [facility]): (R) Knee Gelsyn-3 inj. #2, (PPE).     Recent Visits Date Type Provider Dept  06/24/23 Office Visit Patel, Seema K, NP [facility]  06/18/23 Office Visit Renaldo Caroli, MD [facility]  05/11/23 Office Visit Patel, Seema K, NP [facility]  Showing recent visits within past 90 days and meeting all other requirements Today's Visits Date Type Provider Dept  06/30/23 Procedure visit Renaldo Caroli, MD [facility]  Showing today's visits and meeting all other requirements Future Appointments Date Type Provider Dept  07/14/23 Appointment Renaldo Caroli, MD [facility]  09/24/23 Appointment Patel, Seema K, NP [facility]  Showing future appointments within next 90 days and meeting all other requirements   Disposition: Discharge home  Discharge (Date  Time): 06/30/2023; 1343 hrs.   Primary Care Physician: Antonio Baumgarten, MD Location: [facility]  Note by: Candi Chafe, MD (TTS technology used. I apologize for any typographical errors that were not detected and corrected.) Date: 06/30/2023; Time: 4:31 PM  Disclaimer:  Medicine is not an Visual merchandiser. The only guarantee in medicine is that nothing is guaranteed. It is important to note that the decision to proceed with this intervention was based on the information collected from the patient. The Data and conclusions were drawn from the patient's questionnaire, the interview, and the physical examination. Because the information was provided in large part by the patient, it cannot be guaranteed that it has not been purposely or unconsciously manipulated. Every effort has been made to obtain as much relevant data as possible for this evaluation. It is important to note that the conclusions  that lead to this procedure are derived in large part from the available data. Always take into account that the treatment will also be dependent on availability of resources and existing treatment guidelines, considered by other Pain Management Practitioners as being common knowledge and practice, at the time of the intervention. For Medico-Legal purposes, it is also important to point out that variation in procedural techniques and pharmacological choices are the acceptable norm. The indications, contraindications, technique, and results of the above procedure should only be interpreted and judged by a Board-Certified Interventional Pain Specialist with extensive familiarity and expertise in the same exact procedure and technique.

## 2023-07-01 ENCOUNTER — Telehealth: Payer: Self-pay

## 2023-07-01 NOTE — Telephone Encounter (Signed)
 No issues post-procedure.

## 2023-07-14 ENCOUNTER — Ambulatory Visit: Attending: Pain Medicine | Admitting: Pain Medicine

## 2023-07-14 ENCOUNTER — Encounter: Payer: Self-pay | Admitting: Pain Medicine

## 2023-07-14 VITALS — BP 137/72 | HR 85 | Temp 97.8°F | Resp 16 | Ht 59.0 in | Wt 150.0 lb

## 2023-07-14 DIAGNOSIS — M25561 Pain in right knee: Secondary | ICD-10-CM | POA: Insufficient documentation

## 2023-07-14 DIAGNOSIS — G8929 Other chronic pain: Secondary | ICD-10-CM | POA: Diagnosis present

## 2023-07-14 DIAGNOSIS — M170 Bilateral primary osteoarthritis of knee: Secondary | ICD-10-CM | POA: Insufficient documentation

## 2023-07-14 DIAGNOSIS — M25562 Pain in left knee: Secondary | ICD-10-CM | POA: Insufficient documentation

## 2023-07-14 DIAGNOSIS — M1711 Unilateral primary osteoarthritis, right knee: Secondary | ICD-10-CM | POA: Insufficient documentation

## 2023-07-14 DIAGNOSIS — M150 Primary generalized (osteo)arthritis: Secondary | ICD-10-CM | POA: Insufficient documentation

## 2023-07-14 MED ORDER — LIDOCAINE HCL (PF) 2 % IJ SOLN
INTRAMUSCULAR | Status: AC
  Filled 2023-07-14: qty 5

## 2023-07-14 MED ORDER — SODIUM HYALURONATE (VISCOSUP) 16.8 MG/2ML IX SOSY
16.8000 mg | PREFILLED_SYRINGE | Freq: Once | INTRA_ARTICULAR | Status: AC
  Administered 2023-07-14: 16.8 mg via INTRA_ARTICULAR
  Filled 2023-07-14: qty 2

## 2023-07-14 MED ORDER — PENTAFLUOROPROP-TETRAFLUOROETH EX AERO
INHALATION_SPRAY | Freq: Once | CUTANEOUS | Status: AC
  Administered 2023-07-14: 30 via TOPICAL

## 2023-07-14 MED ORDER — LIDOCAINE HCL (PF) 2 % IJ SOLN
5.0000 mL | Freq: Once | INTRAMUSCULAR | Status: AC
  Administered 2023-07-14: 5 mL

## 2023-07-14 NOTE — Progress Notes (Signed)
 PROVIDER NOTE: Interpretation of information contained herein should be left to medically-trained personnel. Specific patient instructions are provided elsewhere under Patient Instructions section of medical record. This document was created in part using STT-dictation technology, any transcriptional errors that may result from this process are unintentional.  Patient: Charlene [name] Type: Established DOB: February 27, 1960 MRN: [id number] PCP: Sadie Manna, MD  Service: Procedure DOS: 07/14/2023 Setting: Ambulatory Location: Ambulatory outpatient facility Delivery: Face-to-face Provider: Eric DELENA Como, MD Specialty: Interventional Pain Management Specialty designation: 09 Location: Outpatient facility Ref. Prov.: Sadie Manna, MD       Interventional Therapy   Type:  Gelsyn-3 Intra-articular Knee Injection #2  Laterality: Right (-RT) Level/approach: Lateral Imaging guidance: None required ([id number]) Anesthesia: Local anesthesia (1-2% Lidocaine ) Anxiolysis: None                 Sedation: No Sedation                       DOS: 07/14/2023  Performed by: Eric DELENA Como, MD  Purpose: Diagnostic/Therapeutic Indications: Knee arthralgia associated to osteoarthritis of the knee 1. Chronic knee pain (Right)   2. Osteoarthritis of knee (Right)   3. Chronic knee pain (4th area of Pain) (Bilateral) (L>R)   4. Osteoarthritis of knee (Bilateral)   5. Osteoarthritis involving multiple joints    NAS-11 score:   Pre-procedure: 1 /10   Post-procedure: 0-No pain/10     Post-Procedure Evaluation   Procedure No.1:  Gelsyn-3 Intra-articular Knee Injection #1  Laterality: Right (-RT) Level/approach: Lateral Imaging guidance: None required ([id number]) Anesthesia: Local anesthesia (1-2% Lidocaine ) Anxiolysis: IV Versed  2.0 mg Sedation: Moderate Sedation Fentanyl  1 mL (50 mcg) DOS: 06/30/2023  Performed by: Eric DELENA Como, MD  Purpose: Diagnostic/Therapeutic Indications:  Knee arthralgia associated to osteoarthritis of the knee  Procedure No.2: Lumbar Facet, Medial Branch Block(s) (w/o STEROIDS) R5L2  Laterality: Bilateral  Level: L2, L3, L4, L5, and S1 Medial Branch/Dorsal Rami Level(s). Injecting these levels blocks the L3-4, L4-5, and L5-S1 lumbar facet joints. Imaging: Fluoroscopic guidance Spinal ([id number]) Anesthesia: Local anesthesia (1-2% Lidocaine ) Anxiolysis: IV Versed          Sedation: Moderate Sedation                       DOS: 06/30/2023 Performed by: Eric DELENA Como, MD  Primary Purpose: Diagnostic/Therapeutic Indications: Low back pain severe enough to impact quality of life or function. 1. Chronic low back pain (Bilateral) w/o sciatica   2. Arthropathy of lumbar facet joint   3. 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 Grade 1 Retrolisthesis of L2/L3   5. Lumbar facet joint pain   6. Lumbar facet hypertrophy   7. Lumbar facet arthropathy   8. Lumbar facet syndrome   9. Spondylosis without myelopathy or radiculopathy, lumbosacral region   10. Low back pain of over 3 months duration   11. Intermittent low back pain   12. Mechanical low back pain   13. Multifactorial low back pain    NAS-11 Pain score:   Pre-procedure: 1 /10   Post-procedure: 0-No pain/10     Effectiveness:  Initial hour after procedure: 100 % (100% back). Subsequent 4-6 hours post-procedure: 100 % (100 back). Analgesia past initial 6 hours: 50 % (lasting 3 days knee, back 0%). Ongoing improvement:  Analgesic: The patient indicates having attained a 100% relief of the pain for the duration of local anesthetic followed by a decrease to a 50%  improvement on her knee and return of her pain in the area of the lower back.  This was not entirely unexpected as the patient cannot really tolerate steroids and the injections were done with local anesthetic for diagnostic purposes.  In the case of the knee she did receive her first in a series of 3 Gelsyn-3  injections into her right knee.  In the past she has had a prior series of Gelsyn injections with significant relief of the pain.  Failure of the series would suggest progression of the patient's right knee osteoarthritis. Function: Transient improvement ROM: Transient improvement   Pre-Procedure Preparation  Monitoring: As per clinic protocol.  Risk Assessment: Vitals:  AFP:Zdupfjuzi body mass index is 30.3 kg/m as calculated from the following:   Height as of this encounter: 4' 11 (1.499 m).   Weight as of this encounter: 150 lb (68 kg)., Rate:91 , BP:(!) 141/75, Resp:16, Temp:98 F (36.7 C), SpO2:95 %  Allergies: She is allergic to semaglutide.  Precautions: No additional precautions required  Blood-thinner(s): None at this time  Coagulopathies: Reviewed. None identified.   Active Infection(s): Reviewed. None identified. Charlene [name] is afebrile   Location setting: Exam room Position: Sitting w/ knee bent 90 degrees Safety Precautions: Patient was assessed for positional comfort and pressure points before starting the procedure. Prepping solution: DuraPrep (Iodine Povacrylex [0.7% available iodine] and Isopropyl Alcohol, 74% w/w) Prep Area: Entire knee region Approach: percutaneous, just above the tibial plateau, lateral to the infrapatellar tendon. Intended target: Intra-articular knee space Materials: Tray: Block Needle(s): Regular Qty: 1/side Length: 1.5-inch Gauge: 25G (x1) + 22G (x1)  Meds ordered this encounter  Medications   pentafluoroprop-tetrafluoroeth (GEBAUERS) aerosol   lidocaine  HCl (PF) (XYLOCAINE ) 2 % injection 5 mL   sodium hyaluronate (viscosup) (GELSYN-3) intra-articular injection 16.8 mg    Do not substitute. Deliver to facility day before procedure.    Orders Placed This Encounter  Procedures   KNEE INJECTION    Indications: Knee arthralgia (pain) due to osteoarthritis (OA) Imaging: None (CPT-20610) Position: Sitting Equipment/Materials: Block tray   1.5, 25-G (one per side)  Local anesthetic  Monovisc (one per side) Confirm availability (in office) of Monovisc (HMW hyaluronan)    Scheduling Instructions:     Procedure: Knee injection Monovisc (Hyaluronan/Hyaluronic acid)     Treatment No.: 2     Level: Intra-articular     Laterality: Right Knee     Sedation: Patient's choice.     Date: 07/14/2023    Where will this procedure be performed?:   [facility]   KNEE INJECTION    Indications: Knee arthralgia (pain) due to osteoarthritis (OA) Imaging: None (CPT-20610) Nursing: Please request pharmacy to have Gelsyn-3 (Hyaluronan) available in office.    Standing Status:   Future    Expected Date:   07/28/2023    Expiration Date:   10/14/2023    Scheduling Instructions:     Procedure: Knee injection Gelsyn-3 (hyaluronate 8.40mg /mL) (16.8mg /31mL)     Treatment No.: 3 of 3     Level: Intra-articular     Laterality: Right Knee     Sedation: Patient's choice.     Timeframe: in two (2) weeks     Total Hyaluronic acid (AKA hyaluronan or hyaluronate) per injection: 16.8 mg (8.40 mg/mL x 2mL). Series of 3 injections. (Weekly)    Where will this procedure be performed?:   [facility]   Informed Consent Details: Physician/Practitioner Attestation; Transcribe to consent form and obtain patient signature  Nursing Order: Transcribe to consent form and obtain patient signature. Note: Always confirm laterality of pain with Charlene [name], before procedure.    Physician/Practitioner attestation of informed consent for procedure/surgical case:   I, the physician/practitioner, attest that I have discussed with the patient the benefits, risks, side effects, alternatives, likelihood of achieving goals and potential problems during recovery for the procedure that I have provided informed consent.    Procedure:   Therapeutic intra-articular viscosupplementation knee injection    Physician/Practitioner performing the procedure:   Neri Vieyra A.  Tanya, MD    Indication/Reason:   Chronic knee pain secondary to primary osteoarthritis of the knee  (Right-M17.11), (Left-M17.12) or (Bilateral-M17.0)   Provide equipment / supplies at bedside    Procedure tray: Block Tray (Disposable  single use) Skin infiltration needle: Regular 1.5-in, 25-G, (x1) Block Needle type: Regular Amount/quantity: 1 Size: Short(1.5-inch) Gauge: (25G x1) + (22G x1)    Standing Status:   Standing    Number of Occurrences:   1    Specify:   Block Tray     Time-out: 0820 I initiated and conducted the Time-out before starting the procedure, as per protocol. The patient was asked to participate by confirming the accuracy of the Time Out information. Verification of the correct person, site, and procedure were performed and confirmed by me, the nursing staff, and the patient. Time-out conducted as per Joint Commission's Universal Protocol (UP.01.01.01). Procedure checklist: Completed  H&P (Pre-op  Assessment)  Charlene [name] is a 63 y.o. (year old), female patient, seen today for interventional treatment. She  has a past surgical history that includes arthroscopic rotator cuff; Colonoscopy; Abdominal hysterectomy; endosopic sinus; Cataract extraction w/PHACO (Right, 11/25/2017); and Eye surgery. Charlene [name] has a current medication list which includes the following prescription(s): albuterol, alendronate, amlodipine , baclofen , carvedilol , cholecalciferol , freestyle libre sensor system, dicyclomine , duloxetine , empagliflozin, glucagon hcl (diagnostic), hydralazine, insulin  aspart flexpen, toujeo  max solostar, insulin  lispro, losartan, magnesium , meloxicam , metformin, nortriptyline, ondansetron , pantoprazole , polyethylene glycol, pravastatin, pregabalin, sucralfate, and tramadol . Her primarily concern today is the Knee Pain (right)  She is allergic to semaglutide.   Last encounter: My last encounter with her was on 06/30/2023. Pertinent problems: Charlene [name] has Burning  sensation of feet; Neuropathic pain; Neuropathy; Lower extremity numbness and tingling (Left); Polyneuropathy associated with underlying disease (HCC); Chronic ankle pain (Left); Chronic foot pain (Left); Chronic lower extremity pain (2ry area of Pain) (Bilateral) (L>R); Chronic knee pain (4th area of Pain) (Bilateral) (L>R); Chronic low back pain (3ry area of Pain) (Bilateral) (L>R) w/ sciatica (Left); Chronic pain syndrome; DM type 2 with diabetic peripheral neuropathy (HCC); DDD (degenerative disc disease), lumbar; Lumbar facet arthropathy; Lumbar facet syndrome; Lumbar facet hypertrophy; Lumbar foraminal stenosis (L5-S1) (Right); Osteoarthritis of knee (Bilateral); Chronic musculoskeletal pain; Abnormal EMG (07/06/2017); Osteoarthritis involving multiple joints; 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); Knee pain; Back pain with left-sided sciatica; Meningioma (HCC); Neck pain; Chronic pain disorder; Left leg weakness; Meralgia paraesthetica, left; Pain in both lower extremities; Arthropathy of lumbar facet joint; Chronic ankle and foot pain (1ry area of Pain) (Left); 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); 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); Spondylosis without myelopathy or radiculopathy, lumbosacral region; Chronic low back pain (Bilateral) w/o sciatica; Chronic sacroiliac joint pain (Right); Abnormal x-ray of lumbar spine (03/16/2021); Closed compression fracture of L3 lumbar vertebra, sequela; History of compression fracture of vertebral column; Spinal column pain; Chronic knee pain (Left); Osteoarthritis of knee (Left); Pain of patellofemoral joint (Left); Grade 1 Retrolisthesis  of L2/L3; Lumbar facet joint pain; Chronic knee pain (Right); Low back pain of over 3 months duration; Intermittent low back pain; Mechanical low back pain; Multifactorial low back pain; and Osteoarthritis of knee (Right) on their pertinent problem list. Pain Assessment: Severity of  Chronic pain is reported as a 1 /10. Location: Knee Right/denies. Onset: More than a month ago. Quality: Aching. Timing: Intermittent. Modifying factor(s): meds, inj. Vitals:  height is 4' 11 (1.499 m) and weight is 150 lb (68 kg). Her temperature is 97.8 F (36.6 C). Her blood pressure is 137/72 and her pulse is 85. Her respiration is 16 and oxygen saturation is 97%.   Reason for encounter: interventional pain management therapy due pain of at least four (4) weeks in duration, with failure to respond and/or inability to tolerate more conservative care.  Site Confirmation: Charlene [name] was asked to confirm the procedure and laterality before marking the site.  Consent: Before the procedure and under the influence of no sedative(s), amnesic(s), or anxiolytics, the patient was informed of the treatment options, risks and possible complications. To fulfill our ethical and legal obligations, as recommended by the American Medical Association's Code of Ethics, I have informed the patient of my clinical impression; the nature and purpose of the treatment or procedure; the risks, benefits, and possible complications of the intervention; the alternatives, including doing nothing; the risk(s) and benefit(s) of the alternative treatment(s) or procedure(s); and the risk(s) and benefit(s) of doing nothing. The patient was provided information about the general risks and possible complications associated with the procedure. These may include, but are not limited to: failure to achieve desired goals, infection, bleeding, organ or nerve damage, allergic reactions, paralysis, and death. In addition, the patient was informed of those risks and complications associated to Spine-related procedures, such as failure to decrease pain; infection (i.e.: Meningitis, epidural or intraspinal abscess); bleeding (i.e.: epidural hematoma, subarachnoid hemorrhage, or any other type of intraspinal or peri-dural bleeding); organ or nerve  damage (i.e.: Any type of peripheral nerve, nerve root, or spinal cord injury) with subsequent damage to sensory, motor, and/or autonomic systems, resulting in permanent pain, numbness, and/or weakness of one or several areas of the body; allergic reactions; (i.e.: anaphylactic reaction); and/or death. Furthermore, the patient was informed of those risks and complications associated with the medications. These include, but are not limited to: allergic reactions (i.e.: anaphylactic or anaphylactoid reaction(s)); adrenal axis suppression; blood sugar elevation that in diabetics may result in ketoacidosis or comma; water retention that in patients with history of congestive heart failure may result in shortness of breath, pulmonary edema, and decompensation with resultant heart failure; weight gain; swelling or edema; medication-induced neural toxicity; particulate matter embolism and blood vessel occlusion with resultant organ, and/or nervous system infarction; and/or aseptic necrosis of one or more joints. Finally, the patient was informed that Medicine is not an exact science; therefore, there is also the possibility of unforeseen or unpredictable risks and/or possible complications that may result in a catastrophic outcome. The patient indicated having understood very clearly. We have given the patient no guarantees and we have made no promises. Enough time was given to the patient to ask questions, all of which were answered to the patient's satisfaction. Charlene [name] has indicated that she wanted to continue with the procedure. Attestation: I, the ordering provider, attest that I have discussed with the patient the benefits, risks, side-effects, alternatives, likelihood of achieving goals, and potential problems during recovery for the procedure that I have provided informed  consent.  Date  Time: 07/14/2023  8:05 AM  Description of procedure  Start Time: 0820 hrs  Local Anesthesia: Once the patient was  positioned, prepped, and time-out was completed. The target area was identified located. The skin was marked with an approved surgical skin marker. Once marked, the skin (epidermis, dermis, and hypodermis), and deeper tissues (fat, connective tissue and muscle) were infiltrated with a small amount of a short-acting local anesthetic, loaded on a 10cc syringe with a 25G, 1.5-in  Needle. An appropriate amount of time was allowed for local anesthetics to take effect before proceeding to the next step. Local Anesthetic: Lidocaine  1-2% The unused portion of the local anesthetic was discarded in the proper designated containers. Safety Precautions: Aspiration looking for blood return was conducted prior to all injections. At no point did I inject any substances, as a needle was being advanced. Before injecting, the patient was told to immediately notify me if she was experiencing any new onset of ringing in the ears, or metallic taste in the mouth. No attempts were made at seeking any paresthesias. Safe injection practices and needle disposal techniques used. Medications properly checked for expiration dates. SDV (single dose vial) medications used. After the completion of the procedure, all disposable equipment used was discarded in the proper designated medical waste containers.  Technical description: Protocol guidelines were followed. After positioning, the target area was identified and prepped in the usual manner. Skin & deeper tissues infiltrated with local anesthetic. Appropriate amount of time allowed to pass for local anesthetics to take effect. Proper needle placement secured. Once satisfactory needle placement was confirmed, I proceeded to inject the desired solution in slow, incremental fashion, intermittently assessing for discomfort or any signs of abnormal or undesired spread of substance. Once completed, the needle was removed and disposed of, as per hospital protocols. The area was cleaned, making  sure to leave some of the prepping solution back to take advantage of its long term bactericidal properties.  Aspiration:  Negative        Vitals:   07/14/23 0805 07/14/23 0823  BP: (!) 141/75 137/72  Pulse: 91 85  Resp: 16   Temp: 98 F (36.7 C) 97.8 F (36.6 C)  SpO2: 95% 97%  Weight: 150 lb (68 kg)   Height: 4' 11 (1.499 m)     End Time: 0821 hrs  Imaging guidance  Imaging-assisted Technique: None required. Indication(s): N/A Exposure Time: N/A Contrast: None Fluoroscopic Guidance: N/A Ultrasound Guidance: N/A Interpretation: N/A  Post-op assessment  Post-procedure Vital Signs:  Pulse/HCG Rate: 85  Temp: 97.8 F (36.6 C) Resp: 16 BP: 137/72 SpO2: 97 %  EBL: None  Complications: No immediate post-treatment complications observed by team, or reported by patient.  Note: The patient tolerated the entire procedure well. A repeat set of vitals were taken after the procedure and the patient was kept under observation following institutional policy, for this type of procedure. Post-procedural neurological assessment was performed, showing return to baseline, prior to discharge. The patient was provided with post-procedure discharge instructions, including a section on how to identify potential problems. Should any problems arise concerning this procedure, the patient was given instructions to immediately contact us , at any time, without hesitation. In any case, we plan to contact the patient by telephone for a follow-up status report regarding this interventional procedure.  Comments:  No additional relevant information.  Plan of care  Chronic Opioid Analgesic:  Analgesic: Tramadol  50 mg, 1 tab PO QD. ABNORMAL UDS (09/03/2020) (+) for  COCAINE. MME: 5 mg/day    Medications administered: We administered pentafluoroprop-tetrafluoroeth, lidocaine  HCl (PF), and sodium hyaluronate (viscosup).    Interventional Therapies  Risk Factors  Considerations:   NOTE:  UNCONTROLLED IDDM (NO STEROIDS)  ABNORMAL UDS (09/03/2020) (+) for COCAINE       Planned  Pending:  (NO STEROIDS)  Therapeutic right Gelsyn-3 knee injection #5 (second of 2025) (07/14/2023)  Therapeutic right Gelsyn-3 knee injection series (3 of 3)  (07/14/2023) today I have recommended to the patient to consider the use of a TENS unit.  (Information provided)    Under consideration:   Possible bilateral lumbar facet RFA #1  Diagnostic bilateral genicular NB #1    Completed:   Diagnostic right lumbar facet MBB x5 (06/30/2023) (100/100/0) (no steroid) (Diagnostic - local anesthetics only.)  Diagnostic left lumbar facet MBB x2 (06/30/2023) (100/100/0) (no steroid) (Diagnostic - local anesthetics only.)  Therapeutic left L3-4 LESI x1 (02/11/2022) (100/100/75/100)  Therapeutic left L4-5 LESI x4 (09/21/2018) (100/100/50/70)  Therapeutic left L5 TFESI x4 (09/21/2018) (100/100/50/70)  Therapeutic left L5-S1 LESI x2 (02/25/2018) (100/100/100/100)  Therapeutic bilateral Hyalgan knee inj. x5 (02/25/2018) (100/100/100 x2 weeks/<50)  Therapeutic left IA steroid knee inj. x1 (11/23/2017) (100/100/0/0)  Therapeutic left IA Gelsyn-3 knee inj. x4 (06/30/2023) (100/100/50/50)  Therapeutic left IA Monovisc knee inj. x1 (03/18/2022) (100/100/50/50)    Therapeutic  Palliative (PRN) options:  (NO PRN PROCEDURES W/O MD F2F EVALUATION) Palliative L5 TFESI  Palliative L4-5 LESI   Palliative L5-S1 LESI   Palliative Hyalgan knee inj.     Follow-up plan:   Return in about 2 weeks (around 07/28/2023) for (Clinic): (R) Gelsyn-3 Knee inj. #3.     Recent Visits Date Type Provider Dept  06/30/23 Procedure visit Tanya Glisson, MD [facility]  06/24/23 Office Visit Patel, Seema K, NP [facility]  06/18/23 Office Visit Tanya Glisson, MD [facility]  05/11/23 Office Visit Patel, Seema K, NP [facility]  Showing recent visits within past 90 days and meeting all other  requirements Today's Visits Date Type Provider Dept  07/14/23 Procedure visit Tanya Glisson, MD [facility]  Showing today's visits and meeting all other requirements Future Appointments Date Type Provider Dept  07/28/23 Appointment Tanya Glisson, MD [facility]  09/24/23 Appointment Patel, Seema K, NP [facility]  Showing future appointments within next 90 days and meeting all other requirements   Disposition: Discharge home  Discharge (Date  Time): 07/14/2023; 0824 hrs.   Primary Care Physician: Sadie Manna, MD Location: [facility] Note by: Glisson DELENA Tanya, MD Date: 07/14/2023; Time: 8:38 AM  DISCLAIMER: Medicine is not an Visual merchandiser. It has no guarantees or warranties. The decision to proceed with this intervention was based on the information collected from the patient. Conclusions were drawn from the patient's questionnaire, interview, and examination. Because information was provided in large part by the patient, it cannot be guaranteed that it has not been purposely or unconsciously manipulated or altered. Every effort has been made to obtain as much accurate, relevant, available data as possible. Always take into account that the treatment will also be dependent on availability of resources and existing treatment guidelines, considered by other Pain Management Specialists as being common knowledge and practice, at the time of the intervention. It is also important to point out that variation in procedural techniques and pharmacological choices are the acceptable norm. For Medico-Legal review purposes, the indications, contraindications, technique, and results of the these procedures should  only be evaluated, judged and interpreted by a Board-Certified Interventional Pain Specialist with extensive familiarity and expertise in the same exact procedure and technique.

## 2023-07-14 NOTE — Progress Notes (Signed)
 Safety precautions to be maintained throughout the outpatient stay will include: orient to surroundings, keep bed in low position, maintain call bell within reach at all times, provide assistance with transfer out of bed and ambulation.

## 2023-07-14 NOTE — Patient Instructions (Addendum)
 ______________________________________________________________________    Post-Procedure Discharge Instructions  Instructions: Apply ice:  Purpose: This will minimize any swelling and discomfort after procedure.  When: Day of procedure, as soon as you get home. How: Fill a plastic sandwich bag with crushed ice. Cover it with a small towel and apply to injection site. How long: (15 min on, 15 min off) Apply for 15 minutes then remove x 15 minutes.  Repeat sequence on day of procedure, until you go to bed. Apply heat:  Purpose: To treat any soreness and discomfort from the procedure. When: Starting the next day after the procedure. How: Apply heat to procedure site starting the day following the procedure. How long: May continue to repeat daily, until discomfort goes away. Food intake: Start with clear liquids (like water) and advance to regular food, as tolerated.  Physical activities: Keep activities to a minimum for the first 8 hours after the procedure. After that, then as tolerated. Driving: If you have received any sedation, be responsible and do not drive. You are not allowed to drive for 24 hours after having sedation. Blood thinner: (Applies only to those taking blood thinners) You may restart your blood thinner 6 hours after your procedure. Insulin : (Applies only to Diabetic patients taking insulin ) As soon as you can eat, you may resume your normal dosing schedule. Infection prevention: Keep procedure site clean and dry. Shower daily and clean area with soap and water. Post-procedure Pain Diary: Extremely important that this be done correctly and accurately. Recorded information will be used to determine the next step in treatment. For the purpose of accuracy, follow these rules: Evaluate only the area treated. Do not report or include pain from an untreated area. For the purpose of this evaluation, ignore all other areas of pain, except for the treated area. After your procedure,  avoid taking a long nap and attempting to complete the pain diary after you wake up. Instead, set your alarm clock to go off every hour, on the hour, for the initial 8 hours after the procedure. Document the duration of the numbing medicine, and the relief you are getting from it. Do not go to sleep and attempt to complete it later. It will not be accurate. If you received sedation, it is likely that you were given a medication that may cause amnesia. Because of this, completing the diary at a later time may cause the information to be inaccurate. This information is needed to plan your care. Follow-up appointment: Keep your post-procedure follow-up evaluation appointment after the procedure (usually 2 weeks for most procedures, 6 weeks for radiofrequencies). DO NOT FORGET to bring you pain diary with you.   Expect: (What should I expect to see with my procedure?) From numbing medicine (AKA: Local Anesthetics): Numbness or decrease in pain. You may also experience some weakness, which if present, could last for the duration of the local anesthetic. Onset: Full effect within 15 minutes of injected. Duration: It will depend on the type of local anesthetic used. On the average, 1 to 8 hours.  From steroids (Applies only if steroids were used): Decrease in swelling or inflammation. Once inflammation is improved, relief of the pain will follow. Onset of benefits: Depends on the amount of swelling present. The more swelling, the longer it will take for the benefits to be seen. In some cases, up to 10 days. Duration: Steroids will stay in the system x 2 weeks. Duration of benefits will depend on multiple posibilities including persistent irritating factors. Side-effects: If  present, they may typically last 2 weeks (the duration of the steroids). Frequent: Cramps (if they occur, drink Gatorade and take over-the-counter Magnesium  450-500 mg once to twice a day); water retention with temporary weight gain;  increases in blood sugar; decreased immune system response; increased appetite. Occasional: Facial flushing (red, warm cheeks); mood swings; menstrual changes. Uncommon: Long-term decrease or suppression of natural hormones; bone thinning. (These are more common with higher doses or more frequent use. This is why we prefer that our patients avoid having any injection therapies in other practices.)  Very Rare: Severe mood changes; psychosis; aseptic necrosis. From procedure: Some discomfort is to be expected once the numbing medicine wears off. This should be minimal if ice and heat are applied as instructed.  Call if: (When should I call?) You experience numbness and weakness that gets worse with time, as opposed to wearing off. New onset bowel or bladder incontinence. (Applies only to procedures done in the spine)  Emergency Numbers: Durning business hours (Monday - Thursday, 8:00 AM - 4:00 PM) (Friday, 9:00 AM - 12:00 Noon): [phone] After hours: [phone] NOTE: If you are having a problem and are unable connect with, or to talk to a provider, then go to your nearest urgent care or emergency department. If the problem is serious and urgent, please call 911. ______________________________________________________________________    ______________________________________________________________________    TENS (Device can be purchased online, without prescription. Search: TENS 7000.) Transcutaneous electrical nerve stimulation (TENS) is a method of pain relief involving the use of a mild electrical current. A TENS machine is a small, battery-operated device that has leads connected to sticky pads called electrodes.   Rechargeable 9V batteries:     Electrode placement:   TENS UNIT SAFETY WARNING SHEET and INFORMATION INDICATIONS AND CONTRAINDICTIONS Read the operation manual before using the device. Freight forwarder ([place] ) restricts this device to sale by or on the order of a  physician. Observe your physician's precise instructions and let him show you where to apply the electrodes. For a successful therapy, the correct application of the electrodes is an important factor. Carefully write down the settings your physician recommended. Indications for use This device is a prescription device and only for symptomatic relief of chronic intractable pain. Contraindications:   Any electrode placement that applies current to the carotid sinus (neck) region.   Patients with implanted electronic devices (for example, a pacemaker) or metallic implants should not undertake.   Any electrode placement that causes current to flow transcerebrally (through the head). The use of unit whenever pain symptoms are undiagnosed, unit etiology is determined.   The use of TENS whenever pain syndromes are undiagnosed, until etiology is established.  WARNINGS AND PRECAUTIONS  Warnings:   The device must be kept out of reach of children.   The safety of device for use during pregnancy or delivery has not been established.   Do not place electrodes on front of the throat. This may result in spasms of the laryngeal and pharyngeal muscles.   Do not place the electrodes over the carotid nerve (side of neck below ear).   The device is not effective for pain of central origin (headaches).   The device may interfere with electronic monitoring equipment (such as ECG monitors and ECG alarms).   Electrodes should not be placed over the eyes, in the mouth, or internally.   These devices have no curative value.   TENS devices should be used only under the continued supervision of a  physician.   TENS is a symptomatic treatment and as such suppresses the sensation of pain which would otherwise serve as a protective mechanism. Precautions/Adverse Reactions   Isolated cases of skin irritation may occur at the site of electrode placement following long-term application.   Stimulation should be stopped and electrodes removed until  the cause of the irritation can be determined.   Effectiveness is highly dependent upon patient selection by a person qualified in the management of pain patients.   If the device treatment becomes ineffective or unpleasant, stimulation should be discontinued until reevaluation by a physician/clinician.   Always turn the device off before applying or removing electrodes.   Skin irritation and electrode burns are potential adverse reactions.  PURPOSE: A Transcutaneous Electrical Nerve Stimulator, or TENS, unit is designed to relieve post-operative, acute and chronic pain. It is used for pain caused by peripheral nerves and not central. TENS units are prescription-only devices.  OPERATION: TENS units work in a couple of ways. The first way they are thought to work is by a method called the [employer]. The [employer] states that our brains can only handle one stimulus at a time. When you have chronic pain, this pain signal is constantly being sent to your brain and recognized as pain. When an electrical stimulus is added to the area of pain the body feels this electrical stimulus, and since the brain can only handle one thing at a time, the pain is not transmitted to the brain. The second method thought to be part of TENS unit's success is by way of stimulating our own bodies to release their own natural painkillers. TENS units do not work for everyone and results may vary. Always follow the instructions and warnings in your user's manual.  USE: One of the most important tasks that must be performed is battery maintenance. If you are using a Engineering geologist, always fully charge it and fully deplete it before charging it again. These batteries can develop memories and by not performing this charging task correctly, your battery's life can be greatly diminished. If your battery does develop a memory you can help expand the memory by charging for 12 - 13 hours and then completely depleting the  battery. Always prepare the skin before applying electrodes. Your skin should be clean and free of any lotions or creams. If you are using electrodes that use conductive gel, apply a small, even layer over the electrode. For carbon, self-adhesive electrodes, apply a drop of water to the electrodes before applying to the skin. The electrodes attach to the lead wires and then the TENS unit. Always grasp the connector and not the cord when inserting or removing. When making adjustments, always make sure the unit's channels (1 and 2) are in the OFF position. The actual settings should be recommended and prescribed by your physician. Medical equipment suppliers don'tset or instruct users as to user settings. When you are using the BURST mode, the unit delivers a series of quick pulses followed by a rest. This cycle repeats itself frequently. Always have channels OFF before changing modes.  For MODULATION mode, the stimulation automatically varies the width of the pulse.  For CONVENTIONAL mode, the stimulation is constant. After the settings have been fine-tuned, set the timer to 30 or 60 minutes. Your physician should also prescribe the use time. When the lights become dim, it means your batteries should be replaced or recharged.  ACCESSORIES: The electrodes and lead wires can be obtained from  your medical equipment supplier. Your medical equipment supplier can set up a recurring delivery to accommodate your needs. Electrodes should be replaced once a month and lead wires once every 6 months.  Video Tutorial https://youtu.be/[email or web address]  ______________________________________________________________________    ______________________________________________________________________    Procedure instructions  Stop blood-thinners  Do not eat or drink fluids (other than water) for 6 hours before your procedure  No water for 2 hours before your procedure  Take your blood pressure medicine  with a sip of water  Arrive 30 minutes before your appointment  If sedation is planned, bring suitable driver. [place], [place], & public transportation are NOT APPROVED)  Carefully read the Preparing for your procedure detailed instructions  If you have questions call us  at [phone]  Procedure appointments are for procedures only.   NO medication refills or new problem evaluations will be done on procedure days.   Only the scheduled, pre-approved procedure and side will be done.   ______________________________________________________________________      ______________________________________________________________________    Preparing for your procedure  Appointments: If you think you may not be able to keep your appointment, call 24-48 hours in advance to cancel. We need time to make it available to others.  Procedure visits are for procedures only. During your procedure appointment there will be: NO Prescription Refills*. NO medication changes or discussions*. NO discussion of disability issues*. NO unrelated pain problem evaluations*. NO evaluations to order other pain procedures*. *These will be addressed at a separate and distinct evaluation encounter on the provider's evaluation schedule and not during procedure days.  Instructions: Food intake: Avoid eating anything solid for at least 8 hours prior to your procedure. Clear liquid intake: You may take clear liquids such as water up to 2 hours prior to your procedure. (No carbonated drinks. No soda.) Transportation: Unless otherwise stated by your physician, bring a driver. (Driver cannot be a Market researcher, Pharmacist, community, or any other form of public transportation.) Morning Medicines: Except for blood thinners, take all of your other morning medications with a sip of water. Make sure to take your heart and blood pressure medicines. If your blood pressure's lower number is above 100, the case will be rescheduled. Blood thinners:  Make sure to stop your blood thinners as instructed.  If you take a blood thinner, but were not instructed to stop it, call our office [phone] and ask to talk to a nurse. Not stopping a blood thinner prior to certain procedures could lead to serious complications. Diabetics on insulin : Notify the staff so that you can be scheduled 1st case in the morning. If your diabetes requires high dose insulin , take only  of your normal insulin  dose the morning of the procedure and notify the staff that you have done so. Preventing infections: Shower with an antibacterial soap the morning of your procedure.  Build-up your immune system: Take 1000 mg of Vitamin C with every meal (3 times a day) the day prior to your procedure. Antibiotics: Inform the nursing staff if you are taking any antibiotics or if you have any conditions that may require antibiotics prior to procedures. (Example: recent joint implants)   Pregnancy: If you are pregnant make sure to notify the nursing staff. Not doing so may result in injury to the fetus, including death.  Sickness: If you have a cold, fever, or any active infections, call and cancel or reschedule your procedure. Receiving steroids while having an infection may result in complications. Arrival: You must be in the facility  at least 30 minutes prior to your scheduled procedure. Tardiness: Your scheduled time is also the cutoff time. If you do not arrive at least 15 minutes prior to your procedure, you will be rescheduled.  Children: Do not bring any children with you. Make arrangements to keep them home. Dress appropriately: There is always a possibility that your clothing may get soiled. Avoid long dresses. Valuables: Do not bring any jewelry or valuables.  Reasons to call and reschedule or cancel your procedure: (Following these recommendations will minimize the risk of a serious complication.) Surgeries: Avoid having procedures within 2 weeks of any surgery. (Avoid for  2 weeks before or after any surgery). Flu Shots: Avoid having procedures within 2 weeks of a flu shots or . (Avoid for 2 weeks before or after immunizations). Barium: Avoid having a procedure within 7-10 days after having had a radiological study involving the use of radiological contrast. (Myelograms, Barium swallow or enema study). Heart attacks: Avoid any elective procedures or surgeries for the initial 6 months after a Myocardial Infarction (Heart Attack). Blood thinners: It is imperative that you stop these medications before procedures. Let us  know if you if you take any blood thinner.  Infection: Avoid procedures during or within two weeks of an infection (including chest colds or gastrointestinal problems). Symptoms associated with infections include: Localized redness, fever, chills, night sweats or profuse sweating, burning sensation when voiding, cough, congestion, stuffiness, runny nose, sore throat, diarrhea, nausea, vomiting, cold or Flu symptoms, recent or current infections. It is specially important if the infection is over the area that we intend to treat. Heart and lung problems: Symptoms that may suggest an active cardiopulmonary problem include: cough, chest pain, breathing difficulties or shortness of breath, dizziness, ankle swelling, uncontrolled high or unusually low blood pressure, and/or palpitations. If you are experiencing any of these symptoms, cancel your procedure and contact your primary care physician for an evaluation.  Remember:  Regular Business hours are:  Monday to Thursday 8:00 AM to 4:00 PM  Provider's Schedule: Eric Como, MD:  Procedure days: Tuesday and Thursday 7:30 AM to 4:00 PM  Wallie Sherry, MD:  Procedure days: Monday and Wednesday 7:30 AM to 4:00 PM Last  Updated: 12/30/2022 ______________________________________________________________________      ______________________________________________________________________    General  Risks and Possible Complications  Patient Responsibilities: It is important that you read this as it is part of your informed consent. It is our duty to inform you of the risks and possible complications associated with treatments offered to you. It is your responsibility as a patient to read this and to ask questions about anything that is not clear or that you believe was not covered in this document.  Patient's Rights: You have the right to refuse treatment. You also have the right to change your mind, even after initially having agreed to have the treatment done. However, under this last option, if you wait until the last second to change your mind, you may be charged for the materials used up to that point.  Introduction: Medicine is not an Visual merchandiser. Everything in Medicine, including the lack of treatment(s), carries the potential for danger, harm, or loss (which is by definition: Risk). In Medicine, a complication is a secondary problem, condition, or disease that can aggravate an already existing one. All treatments carry the risk of possible complications. The fact that a side effects or complications occurs, does not imply that the treatment was conducted incorrectly. It must be clearly understood that  these can happen even when everything is done following the highest safety standards.  No treatment: You can choose not to proceed with the proposed treatment alternative. The "PRO(s)" would include: avoiding the risk of complications associated with the therapy. The "CON(s)" would include: not getting any of the treatment benefits. These benefits fall under one of three categories: diagnostic; therapeutic; and/or palliative. Diagnostic benefits include: getting information which can ultimately lead to improvement of the disease or symptom(s). Therapeutic benefits are those associated with the successful treatment of the disease. Finally, palliative benefits are those related to the decrease of  the primary symptoms, without necessarily curing the condition (example: decreasing the pain from a flare-up of a chronic condition, such as incurable terminal cancer).  General Risks and Complications: These are associated to most interventional treatments. They can occur alone, or in combination. They fall under one of the following six (6) categories: no benefit or worsening of symptoms; bleeding; infection; nerve damage; allergic reactions; and/or death. No benefits or worsening of symptoms: In Medicine there are no guarantees, only probabilities. No healthcare provider can ever guarantee that a medical treatment will work, they can only state the probability that it may. Furthermore, there is always the possibility that the condition may worsen, either directly, or indirectly, as a consequence of the treatment. Bleeding: This is more common if the patient is taking a blood thinner, either prescription or over the counter (example: Goody Powders, Fish oil, Aspirin, Garlic, etc.), or if suffering a condition associated with impaired coagulation (example: Hemophilia, cirrhosis of the liver, low platelet counts, etc.). However, even if you do not have one on these, it can still happen. If you have any of these conditions, or take one of these drugs, make sure to notify your treating physician. Infection: This is more common in patients with a compromised immune system, either due to disease (example: diabetes, cancer, human immunodeficiency virus [HIV], etc.), or due to medications or treatments (example: therapies used to treat cancer and rheumatological diseases). However, even if you do not have one on these, it can still happen. If you have any of these conditions, or take one of these drugs, make sure to notify your treating physician. Nerve Damage: This is more common when the treatment is an invasive one, but it can also happen with the use of medications, such as those used in the treatment of cancer.  The damage can occur to small secondary nerves, or to large primary ones, such as those in the spinal cord and brain. This damage may be temporary or permanent and it may lead to impairments that can range from temporary numbness to permanent paralysis and/or brain death. Allergic Reactions: Any time a substance or material comes in contact with our body, there is the possibility of an allergic reaction. These can range from a mild skin rash (contact dermatitis) to a severe systemic reaction (anaphylactic reaction), which can result in death. Death: In general, any medical intervention can result in death, most of the time due to an unforeseen complication. ______________________________________________________________________

## 2023-07-15 ENCOUNTER — Telehealth: Payer: Self-pay | Admitting: *Deleted

## 2023-07-15 DIAGNOSIS — E1122 Type 2 diabetes mellitus with diabetic chronic kidney disease: Secondary | ICD-10-CM | POA: Insufficient documentation

## 2023-07-15 DIAGNOSIS — D631 Anemia in chronic kidney disease: Secondary | ICD-10-CM | POA: Insufficient documentation

## 2023-07-15 DIAGNOSIS — N1832 Chronic kidney disease, stage 3b: Secondary | ICD-10-CM | POA: Insufficient documentation

## 2023-07-15 DIAGNOSIS — R809 Proteinuria, unspecified: Secondary | ICD-10-CM | POA: Insufficient documentation

## 2023-07-15 DIAGNOSIS — E785 Hyperlipidemia, unspecified: Secondary | ICD-10-CM | POA: Insufficient documentation

## 2023-07-15 DIAGNOSIS — R8290 Unspecified abnormal findings in urine: Secondary | ICD-10-CM | POA: Insufficient documentation

## 2023-07-15 NOTE — Telephone Encounter (Signed)
Attempted to call for post procedure follow-up. No answer, no voicemail. 

## 2023-07-17 ENCOUNTER — Other Ambulatory Visit: Payer: Self-pay | Admitting: Nephrology

## 2023-07-17 DIAGNOSIS — N183 Chronic kidney disease, stage 3 unspecified: Secondary | ICD-10-CM

## 2023-07-17 DIAGNOSIS — R809 Proteinuria, unspecified: Secondary | ICD-10-CM

## 2023-07-17 DIAGNOSIS — E785 Hyperlipidemia, unspecified: Secondary | ICD-10-CM

## 2023-07-17 DIAGNOSIS — I10 Essential (primary) hypertension: Secondary | ICD-10-CM

## 2023-07-17 DIAGNOSIS — D631 Anemia in chronic kidney disease: Secondary | ICD-10-CM

## 2023-07-17 DIAGNOSIS — R8290 Unspecified abnormal findings in urine: Secondary | ICD-10-CM

## 2023-07-22 ENCOUNTER — Ambulatory Visit
Admission: RE | Admit: 2023-07-22 | Discharge: 2023-07-22 | Disposition: A | Discharge status: Home / Self Care | Source: Ambulatory Visit | Attending: Nephrology | Admitting: Nephrology

## 2023-07-22 DIAGNOSIS — R8290 Unspecified abnormal findings in urine: Secondary | ICD-10-CM | POA: Diagnosis present

## 2023-07-22 DIAGNOSIS — I10 Essential (primary) hypertension: Secondary | ICD-10-CM | POA: Insufficient documentation

## 2023-07-22 DIAGNOSIS — N183 Chronic kidney disease, stage 3 unspecified: Secondary | ICD-10-CM | POA: Diagnosis present

## 2023-07-22 DIAGNOSIS — N1832 Chronic kidney disease, stage 3b: Secondary | ICD-10-CM | POA: Diagnosis present

## 2023-07-22 DIAGNOSIS — R809 Proteinuria, unspecified: Secondary | ICD-10-CM | POA: Insufficient documentation

## 2023-07-22 DIAGNOSIS — E1122 Type 2 diabetes mellitus with diabetic chronic kidney disease: Secondary | ICD-10-CM | POA: Diagnosis present

## 2023-07-22 DIAGNOSIS — D631 Anemia in chronic kidney disease: Secondary | ICD-10-CM | POA: Insufficient documentation

## 2023-07-22 DIAGNOSIS — E785 Hyperlipidemia, unspecified: Secondary | ICD-10-CM | POA: Diagnosis present

## 2023-07-28 ENCOUNTER — Encounter: Payer: Self-pay | Admitting: Pain Medicine

## 2023-07-28 ENCOUNTER — Ambulatory Visit: Attending: Pain Medicine | Admitting: Pain Medicine

## 2023-07-28 VITALS — BP 120/82 | HR 73 | Temp 97.3°F | Resp 16 | Ht 59.0 in | Wt 150.0 lb

## 2023-07-28 DIAGNOSIS — Z5189 Encounter for other specified aftercare: Secondary | ICD-10-CM | POA: Insufficient documentation

## 2023-07-28 DIAGNOSIS — Z09 Encounter for follow-up examination after completed treatment for conditions other than malignant neoplasm: Secondary | ICD-10-CM | POA: Diagnosis present

## 2023-07-28 DIAGNOSIS — G8929 Other chronic pain: Secondary | ICD-10-CM | POA: Diagnosis present

## 2023-07-28 DIAGNOSIS — M1711 Unilateral primary osteoarthritis, right knee: Secondary | ICD-10-CM | POA: Diagnosis present

## 2023-07-28 DIAGNOSIS — M150 Primary generalized (osteo)arthritis: Secondary | ICD-10-CM | POA: Insufficient documentation

## 2023-07-28 DIAGNOSIS — M25561 Pain in right knee: Secondary | ICD-10-CM | POA: Insufficient documentation

## 2023-07-28 MED ORDER — ROPIVACAINE HCL 2 MG/ML IJ SOLN
3.0000 mL | Freq: Once | INTRAMUSCULAR | Status: AC
  Administered 2023-07-28: 20 mL via INTRA_ARTICULAR

## 2023-07-28 MED ORDER — PENTAFLUOROPROP-TETRAFLUOROETH EX AERO
INHALATION_SPRAY | Freq: Once | CUTANEOUS | Status: AC
  Administered 2023-07-28: 30 via TOPICAL

## 2023-07-28 MED ORDER — LIDOCAINE HCL (PF) 2 % IJ SOLN
5.0000 mL | Freq: Once | INTRAMUSCULAR | Status: AC
  Administered 2023-07-28: 5 mL

## 2023-07-28 MED ORDER — SODIUM HYALURONATE (VISCOSUP) 16.8 MG/2ML IX SOSY
16.8000 mg | PREFILLED_SYRINGE | Freq: Once | INTRA_ARTICULAR | Status: AC
  Administered 2023-07-28: 16.8 mg via INTRA_ARTICULAR

## 2023-07-28 MED ORDER — ROPIVACAINE HCL 2 MG/ML IJ SOLN
INTRAMUSCULAR | Status: AC
  Filled 2023-07-28: qty 20

## 2023-07-28 MED ORDER — LIDOCAINE HCL (PF) 2 % IJ SOLN
INTRAMUSCULAR | Status: AC
  Filled 2023-07-28: qty 5

## 2023-07-28 NOTE — Progress Notes (Signed)
 PROVIDER NOTE: Interpretation of information contained herein should be left to medically-trained personnel. Specific patient instructions are provided elsewhere under Patient Instructions section of medical record. This document was created in part using STT-dictation technology, any transcriptional errors that may result from this process are unintentional.  Patient: Charlene [name] Type: Established DOB: 1960/09/03 MRN: [id number] PCP: Sadie Manna, MD  Service: Procedure DOS: 07/28/2023 Setting: Ambulatory Location: Ambulatory outpatient facility Delivery: Face-to-face Provider: Eric DELENA Como, MD Specialty: Interventional Pain Management Specialty designation: 09 Location: Outpatient facility Ref. Prov.: Sadie Manna, MD       Interventional Therapy   Type:  Gelsyn-3 Intra-articular Knee Injection #3  Laterality: Right (-RT) Level/approach: Lateral Imaging guidance: None required ([id number]) Anesthesia: Local anesthesia (1-2% Lidocaine ) Anxiolysis: None                 Sedation: No Sedation                       DOS: 07/28/2023  Performed by: Eric DELENA Como, MD  Purpose: Diagnostic/Therapeutic Indications: Knee arthralgia associated to osteoarthritis of the knee 1. Chronic knee pain (Right)   2. Osteoarthritis of knee (Right)   3. Osteoarthritis involving multiple joints   4. Encounter for therapeutic procedure   5. Postop check    NAS-11 score:   Pre-procedure: 1 /10   Post-procedure: 0-No pain/10   Discussed the use of AI scribe software for clinical note transcription with the patient, who gave verbal consent to proceed.  History of Present Illness   Charlene [name] is a 63 year old female who presents for a right knee injection with Gelsung.  She is undergoing a series of three Gelsung injections for her right knee, with this being the final injection. The injections aim to lubricate the joint and stimulate cartilage to produce more lubricant,  potentially reducing inflammation and improving joint function.  Recently, she experienced a fall while ascending steps, injuring her left side and knee. She did not fall completely but sustained a knee injury. Her knee was asymptomatic prior to this incident. There is no evidence of fractures or severe injury from the fall.        Post-Procedure Evaluation   Type:  Gelsyn-3 Intra-articular Knee Injection #2  Laterality: Right (-RT) Level/approach: Lateral Imaging guidance: None required ([id number]) Anesthesia: Local anesthesia (1-2% Lidocaine ) Anxiolysis: None                 Sedation: No Sedation                       DOS: 07/14/2023  Performed by: Eric DELENA Como, MD  Purpose: Diagnostic/Therapeutic Indications: Knee arthralgia associated to osteoarthritis of the knee  NAS-11 score:   Pre-procedure: 1 /10   Post-procedure: 0-No pain/10     Effectiveness:  Initial hour after procedure: 100 %. Subsequent 4-6 hours post-procedure: 100 %. Analgesia past initial 6 hours: 0 % (Patient had great but she had a fall later that day and seemed like pain returned). Ongoing improvement:  Analgesic: The patient indicated having attained 100% relief of the pain with the second Gelsyn 3 injection however, she indicates that soon after having had the treatment she was going up a set of stairs and she lost balance and fell onto her left side and although she did not hit her knee she did hurt her left hip and hit her head.  She went to an urgent care and while she was  being examined, apparently they started checking on her right knee as well and this is when some of the pain returned. Function: Transient improvement ROM: Somewhat improved  Pre-Procedure Preparation  Monitoring: As per clinic protocol.  Risk Assessment: Vitals:  AFP:Zdupfjuzi body mass index is 30.3 kg/m as calculated from the following:   Height as of this encounter: 4' 11 (1.499 m).   Weight as of this encounter: 150 lb  (68 kg)., Rate:73 , BP:120/82, Resp:16, Temp:(!) 97.3 F (36.3 C), SpO2:98 %  Allergies: She is allergic to semaglutide.  Precautions: No additional precautions required  Blood-thinner(s): None at this time  Coagulopathies: Reviewed. None identified.   Active Infection(s): Reviewed. None identified. Ms. Mol is afebrile   Location setting: Exam room Position: Sitting w/ knee bent 90 degrees Safety Precautions: Patient was assessed for positional comfort and pressure points before starting the procedure. Prepping solution: DuraPrep (Iodine Povacrylex [0.7% available iodine] and Isopropyl Alcohol, 74% w/w) Prep Area: Entire knee region Approach: percutaneous, just above the tibial plateau, lateral to the infrapatellar tendon. Intended target: Intra-articular knee space Materials: Tray: Block Needle(s): Regular Qty: 1/side Length: 1.5-inch Gauge: 25G (x1) + 22G (x1)  Meds ordered this encounter  Medications   pentafluoroprop-tetrafluoroeth (GEBAUERS) aerosol   lidocaine  HCl (PF) (XYLOCAINE ) 2 % injection 5 mL   ropivacaine  (PF) 2 mg/mL (0.2%) (NAROPIN ) injection 3 mL   sodium hyaluronate (viscosup) (GELSYN-3) intra-articular injection 16.8 mg    Do not substitute. Deliver to facility day before procedure.    Orders Placed This Encounter  Procedures   KNEE INJECTION    Indications: Knee arthralgia (pain) due to osteoarthritis (OA) Imaging: None (CPT-20610) Position: Sitting Equipment/Materials: Block tray  1.5, 25-G (one per side)  Local anesthetic  Monovisc (one per side) Confirm availability (in office) of Monovisc (HMW hyaluronan)    Scheduling Instructions:     Procedure: Knee injection Monovisc (Hyaluronan/Hyaluronic acid)     Treatment No.: 3     Level: Intra-articular     Laterality: Right Knee     Sedation: Patient's choice.     Date: 07/28/2023    Where will this procedure be performed?:   [facility]   Nursing Instructions:    Please complete this  patient's postprocedure evaluation.    Scheduling Instructions:     Please complete this patient's postprocedure evaluation.   Informed Consent Details: Physician/Practitioner Attestation; Transcribe to consent form and obtain patient signature    Nursing Order: Transcribe to consent form and obtain patient signature. Note: Always confirm laterality of pain with Ms. Chesley, before procedure.    Physician/Practitioner attestation of informed consent for procedure/surgical case:   I, the physician/practitioner, attest that I have discussed with the patient the benefits, risks, side effects, alternatives, likelihood of achieving goals and potential problems during recovery for the procedure that I have provided informed consent.    Procedure:   Therapeutic intra-articular viscosupplementation knee injection    Physician/Practitioner performing the procedure:   Sione Baumgarten A. Tanya, MD    Indication/Reason:   Chronic knee pain secondary to primary osteoarthritis of the knee  (Right-M17.11), (Left-M17.12) or (Bilateral-M17.0)   Provide equipment / supplies at bedside    Procedure tray: Block Tray (Disposable  single use) Skin infiltration needle: Regular 1.5-in, 25-G, (x1) Block Needle type: Regular Amount/quantity: 1 Size: Short(1.5-inch) Gauge: (25G x1) + (22G x1)    Standing Status:   Standing    Number of Occurrences:   1    Specify:   Block Tray  Time-out: 0825 I initiated and conducted the Time-out before starting the procedure, as per protocol. The patient was asked to participate by confirming the accuracy of the Time Out information. Verification of the correct person, site, and procedure were performed and confirmed by me, the nursing staff, and the patient. Time-out conducted as per Joint Commission's Universal Protocol (UP.01.01.01). Procedure checklist: Completed  H&P (Pre-op  Assessment)  Ms. Menefee is a 63 y.o. (year old), female patient, seen today for interventional  treatment. She  has a past surgical history that includes arthroscopic rotator cuff; Colonoscopy; Abdominal hysterectomy; endosopic sinus; Cataract extraction w/PHACO (Right, 11/25/2017); and Eye surgery. Ms. Jeon has a current medication list which includes the following prescription(s): albuterol, alendronate, amlodipine , baclofen , carvedilol , cholecalciferol , freestyle libre sensor system, dicyclomine , duloxetine , empagliflozin, glucagon hcl (diagnostic), hydralazine, insulin  aspart flexpen, toujeo  max solostar, insulin  lispro, losartan, magnesium , meloxicam , metformin, nortriptyline, ondansetron , pantoprazole , polyethylene glycol, pravastatin, pregabalin, sucralfate, and tramadol . Her primarily concern today is the Knee Pain (Right )  She is allergic to semaglutide.   Last encounter: My last encounter with her was on 07/14/2023. Pertinent problems: Ms. Rath has Burning sensation of feet; Neuropathic pain; Neuropathy; Lower extremity numbness and tingling (Left); Polyneuropathy associated with underlying disease (HCC); Chronic ankle pain (Left); Chronic foot pain (Left); Chronic lower extremity pain (2ry area of Pain) (Bilateral) (L>R); Chronic knee pain (4th area of Pain) (Bilateral) (L>R); Chronic low back pain (3ry area of Pain) (Bilateral) (L>R) w/ sciatica (Left); Chronic pain syndrome; DM type 2 with diabetic peripheral neuropathy (HCC); DDD (degenerative disc disease), lumbar; Lumbar facet arthropathy; Lumbar facet syndrome; Lumbar facet hypertrophy; Lumbar foraminal stenosis (L5-S1) (Right); Osteoarthritis of knee (Bilateral); Chronic musculoskeletal pain; Abnormal EMG (07/06/2017); Osteoarthritis involving multiple joints; 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); Knee pain; Back pain with left-sided sciatica; Meningioma (HCC); Neck pain; Chronic pain disorder; Left leg weakness; Meralgia paraesthetica, left; Pain in both lower extremities; Arthropathy of lumbar facet  joint; Chronic ankle and foot pain (1ry area of Pain) (Left); 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); 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); Spondylosis without myelopathy or radiculopathy, lumbosacral region; Chronic low back pain (Bilateral) w/o sciatica; Chronic sacroiliac joint pain (Right); Abnormal x-ray of lumbar spine (03/16/2021); Closed compression fracture of L3 lumbar vertebra, sequela; History of compression fracture of vertebral column; Spinal column pain; Chronic knee pain (Left); Osteoarthritis of knee (Left); Pain of patellofemoral joint (Left); Grade 1 Retrolisthesis of L2/L3; Lumbar facet joint pain; Chronic knee pain (Right); Low back pain of over 3 months duration; Intermittent low back pain; Mechanical low back pain; Multifactorial low back pain; and Osteoarthritis of knee (Right) on their pertinent problem list. Pain Assessment: Severity of Chronic pain is reported as a 1 /10. Location:   Anterior, Lateral/Denies. Onset: More than a month ago. Quality: Aching. Timing: Intermittent. Modifying factor(s): Knee injections and medications. Vitals:  height is 4' 11 (1.499 m) and weight is 150 lb (68 kg). Her temporal temperature is 97.3 F (36.3 C) (abnormal). Her blood pressure is 120/82 and her pulse is 73. Her respiration is 16 and oxygen saturation is 98%.   Reason for encounter: interventional pain management therapy due pain of at least four (4) weeks in duration, with failure to respond and/or inability to tolerate more conservative care.  Site Confirmation: Ms. Rabago was asked to confirm the procedure and laterality before marking the site.  Consent: Before the procedure and under the influence of no sedative(s), amnesic(s), or anxiolytics, the patient was informed  of the treatment options, risks and possible complications. To fulfill our ethical and legal obligations, as recommended by the American Medical Association's Code of Ethics, I have informed the  patient of my clinical impression; the nature and purpose of the treatment or procedure; the risks, benefits, and possible complications of the intervention; the alternatives, including doing nothing; the risk(s) and benefit(s) of the alternative treatment(s) or procedure(s); and the risk(s) and benefit(s) of doing nothing. The patient was provided information about the general risks and possible complications associated with the procedure. These may include, but are not limited to: failure to achieve desired goals, infection, bleeding, organ or nerve damage, allergic reactions, paralysis, and death. In addition, the patient was informed of those risks and complications associated to Spine-related procedures, such as failure to decrease pain; infection (i.e.: Meningitis, epidural or intraspinal abscess); bleeding (i.e.: epidural hematoma, subarachnoid hemorrhage, or any other type of intraspinal or peri-dural bleeding); organ or nerve damage (i.e.: Any type of peripheral nerve, nerve root, or spinal cord injury) with subsequent damage to sensory, motor, and/or autonomic systems, resulting in permanent pain, numbness, and/or weakness of one or several areas of the body; allergic reactions; (i.e.: anaphylactic reaction); and/or death. Furthermore, the patient was informed of those risks and complications associated with the medications. These include, but are not limited to: allergic reactions (i.e.: anaphylactic or anaphylactoid reaction(s)); adrenal axis suppression; blood sugar elevation that in diabetics may result in ketoacidosis or comma; water retention that in patients with history of congestive heart failure may result in shortness of breath, pulmonary edema, and decompensation with resultant heart failure; weight gain; swelling or edema; medication-induced neural toxicity; particulate matter embolism and blood vessel occlusion with resultant organ, and/or nervous system infarction; and/or aseptic necrosis  of one or more joints. Finally, the patient was informed that Medicine is not an exact science; therefore, there is also the possibility of unforeseen or unpredictable risks and/or possible complications that may result in a catastrophic outcome. The patient indicated having understood very clearly. We have given the patient no guarantees and we have made no promises. Enough time was given to the patient to ask questions, all of which were answered to the patient's satisfaction. Ms. Forge has indicated that she wanted to continue with the procedure. Attestation: I, the ordering provider, attest that I have discussed with the patient the benefits, risks, side-effects, alternatives, likelihood of achieving goals, and potential problems during recovery for the procedure that I have provided informed consent.  Date  Time: 07/28/2023  8:10 AM  Description of procedure  Start Time: 0826 hrs  Local Anesthesia: Once the patient was positioned, prepped, and time-out was completed. The target area was identified located. The skin was marked with an approved surgical skin marker. Once marked, the skin (epidermis, dermis, and hypodermis), and deeper tissues (fat, connective tissue and muscle) were infiltrated with a small amount of a short-acting local anesthetic, loaded on a 10cc syringe with a 25G, 1.5-in  Needle. An appropriate amount of time was allowed for local anesthetics to take effect before proceeding to the next step. Local Anesthetic: Lidocaine  1-2% The unused portion of the local anesthetic was discarded in the proper designated containers. Safety Precautions: Aspiration looking for blood return was conducted prior to all injections. At no point did I inject any substances, as a needle was being advanced. Before injecting, the patient was told to immediately notify me if she was experiencing any new onset of ringing in the ears, or metallic taste in the  mouth. No attempts were made at seeking any  paresthesias. Safe injection practices and needle disposal techniques used. Medications properly checked for expiration dates. SDV (single dose vial) medications used. After the completion of the procedure, all disposable equipment used was discarded in the proper designated medical waste containers.  Technical description: Protocol guidelines were followed. After positioning, the target area was identified and prepped in the usual manner. Skin & deeper tissues infiltrated with local anesthetic. Appropriate amount of time allowed to pass for local anesthetics to take effect. Proper needle placement secured. Once satisfactory needle placement was confirmed, I proceeded to inject the desired solution in slow, incremental fashion, intermittently assessing for discomfort or any signs of abnormal or undesired spread of substance. Once completed, the needle was removed and disposed of, as per hospital protocols. The area was cleaned, making sure to leave some of the prepping solution back to take advantage of its long term bactericidal properties.  Aspiration:  Negative        Vitals:   07/28/23 0809  BP: 120/82  Pulse: 73  Resp: 16  Temp: (!) 97.3 F (36.3 C)  TempSrc: Temporal  SpO2: 98%  Weight: 150 lb (68 kg)  Height: 4' 11 (1.499 m)    End Time: 0827 hrs  Imaging guidance  Imaging-assisted Technique: None required. Indication(s): N/A Exposure Time: N/A Contrast: None Fluoroscopic Guidance: N/A Ultrasound Guidance: N/A Interpretation: N/A  Post-op assessment  Post-procedure Vital Signs:  Pulse/HCG Rate: 73  Temp: (!) 97.3 F (36.3 C) Resp: 16 BP: 120/82 SpO2: 98 %  EBL: None  Complications: No immediate post-treatment complications observed by team, or reported by patient.  Note: The patient tolerated the entire procedure well. A repeat set of vitals were taken after the procedure and the patient was kept under observation following institutional policy, for this type  of procedure. Post-procedural neurological assessment was performed, showing return to baseline, prior to discharge. The patient was provided with post-procedure discharge instructions, including a section on how to identify potential problems. Should any problems arise concerning this procedure, the patient was given instructions to immediately contact us , at any time, without hesitation. In any case, we plan to contact the patient by telephone for a follow-up status report regarding this interventional procedure.  Comments:  No additional relevant information.  Plan of care  Analgesic: Tramadol  50 mg, 1 tab PO QD. ABNORMAL UDS (09/03/2020) (+) for COCAINE. MME: 5 mg/day   Assessment and Plan    Chronic right knee pain   Chronic right knee pain is managed with Gelsung injections, which act as a lubricant and stimulate cartilage production, reducing inflammation. A recent fall may have exacerbated the condition. If cartilage is significantly worn, injections may not provide long-term relief, necessitating further imaging such as an MRI to assess cartilage loss. If bone-on-bone contact is present, options include knee replacement surgery or nerve block procedures. Administer the final Gelsung injection in the series. Schedule a follow-up appointment in two weeks to assess response to treatment. Consider an MRI if injections do not provide long-term relief. Discuss potential for knee replacement surgery or nerve block procedures if cartilage loss is significant.  Kidney issues   Kidney issues are a consideration in managing knee pain, particularly regarding potential surgical interventions. Non-surgical options such as nerve block procedures are considered to manage knee pain, which may be preferable given her kidney history. Consider non-surgical pain management options such as nerve block procedures if knee replacement is not feasible due to kidney issues.  Medications administered: We  administered pentafluoroprop-tetrafluoroeth, lidocaine  HCl (PF), ropivacaine  (PF) 2 mg/mL (0.2%), and sodium hyaluronate (viscosup).    Interventional Therapies  Risk Factors  Considerations:   NOTE: UNCONTROLLED IDDM (NO STEROIDS)  ABNORMAL UDS (09/03/2020) (+) for COCAINE       Planned  Pending:  (NO STEROIDS)  Therapeutic right Gelsyn-3 knee injection #5 (second of 2025) (07/14/2023)  Therapeutic right Gelsyn-3 knee injection series (3 of 3)  (07/14/2023) today I have recommended to the patient to consider the use of a TENS unit.  (Information provided)    Under consideration:   Possible bilateral lumbar facet RFA #1  Diagnostic bilateral genicular NB #1    Completed:   Diagnostic right lumbar facet MBB x5 (06/30/2023) (100/100/0) (no steroid) (Diagnostic - local anesthetics only.)  Diagnostic left lumbar facet MBB x2 (06/30/2023) (100/100/0) (no steroid) (Diagnostic - local anesthetics only.)  Therapeutic left L3-4 LESI x1 (02/11/2022) (100/100/75/100)  Therapeutic left L4-5 LESI x4 (09/21/2018) (100/100/50/70)  Therapeutic left L5 TFESI x4 (09/21/2018) (100/100/50/70)  Therapeutic left L5-S1 LESI x2 (02/25/2018) (100/100/100/100)  Therapeutic bilateral Hyalgan knee inj. x5 (02/25/2018) (100/100/100 x2 weeks/<50)  Therapeutic left IA steroid knee inj. x1 (11/23/2017) (100/100/0/0)  Therapeutic left IA Gelsyn-3 knee inj. x4 (06/30/2023) (100/100/50/50)  Therapeutic left IA Monovisc knee inj. x1 (03/18/2022) (100/100/50/50)    Therapeutic  Palliative (PRN) options:  (NO PRN PROCEDURES W/O MD F2F EVALUATION) Palliative L5 TFESI  Palliative L4-5 LESI   Palliative L5-S1 LESI   Palliative Hyalgan knee inj.     Follow-up plan:   Return in about 2 weeks (around 08/11/2023) for (Face2F), (PPE).     Recent Visits Date Type Provider Dept  07/14/23 Procedure visit Tanya Glisson, MD [facility]  06/30/23 Procedure visit Tanya Glisson, MD [facility]   06/24/23 Office Visit Patel, Seema K, NP [facility]  06/18/23 Office Visit Tanya Glisson, MD [facility]  05/11/23 Office Visit Patel, Seema K, NP [facility]  Showing recent visits within past 90 days and meeting all other requirements Today's Visits Date Type Provider Dept  07/28/23 Procedure visit Tanya Glisson, MD [facility]  Showing today's visits and meeting all other requirements Future Appointments Date Type Provider Dept  08/11/23 Appointment Tanya Glisson, MD [facility]  09/24/23 Appointment Patel, Seema K, NP [facility]  Showing future appointments within next 90 days and meeting all other requirements   Disposition: Discharge home  Discharge (Date  Time): 07/28/2023; 0835 hrs.   Primary Care Physician: Sadie Manna, MD Location: [facility] Note by: Glisson DELENA Tanya, MD Date: 07/28/2023; Time: 8:45 AM  DISCLAIMER: Medicine is not an Visual merchandiser. It has no guarantees or warranties. The decision to proceed with this intervention was based on the information collected from the patient. Conclusions were drawn from the patient's questionnaire, interview, and examination. Because information was provided in large part by the patient, it cannot be guaranteed that it has not been purposely or unconsciously manipulated or altered. Every effort has been made to obtain as much accurate, relevant, available data as possible. Always take into account that the treatment will also be dependent on availability of resources and existing treatment guidelines, considered by other Pain Management Specialists as being common knowledge and practice, at the time of the intervention. It is also important to point out that variation in procedural techniques and pharmacological choices are the acceptable norm. For Medico-Legal review purposes, the indications, contraindications, technique,  and results  of the these procedures should only be evaluated, judged and interpreted by a Board-Certified Interventional Pain Specialist with extensive familiarity and expertise in the same exact procedure and technique.

## 2023-07-28 NOTE — Patient Instructions (Signed)
____________________________________________________________________________________________  Post-Procedure Discharge Instructions  Instructions: Apply ice:  Purpose: This will minimize any swelling and discomfort after procedure.  When: Day of procedure, as soon as you get home. How: Fill a plastic sandwich bag with crushed ice. Cover it with a small towel and apply to injection site. How long: (15 min on, 15 min off) Apply for 15 minutes then remove x 15 minutes.  Repeat sequence on day of procedure, until you go to bed. Apply heat:  Purpose: To treat any soreness and discomfort from the procedure. When: Starting the next day after the procedure. How: Apply heat to procedure site starting the day following the procedure. How long: May continue to repeat daily, until discomfort goes away. Food intake: Start with clear liquids (like water) and advance to regular food, as tolerated.  Physical activities: Keep activities to a minimum for the first 8 hours after the procedure. After that, then as tolerated. Driving: If you have received any sedation, be responsible and do not drive. You are not allowed to drive for 24 hours after having sedation. Blood thinner: (Applies only to those taking blood thinners) You may restart your blood thinner 6 hours after your procedure. Insulin: (Applies only to Diabetic patients taking insulin) As soon as you can eat, you may resume your normal dosing schedule. Infection prevention: Keep procedure site clean and dry. Shower daily and clean area with soap and water. Post-procedure Pain Diary: Extremely important that this be done correctly and accurately. Recorded information will be used to determine the next step in treatment. For the purpose of accuracy, follow these rules: Evaluate only the area treated. Do not report or include pain from an untreated area. For the purpose of this evaluation, ignore all other areas of pain, except for the treated  area. After your procedure, avoid taking a long nap and attempting to complete the pain diary after you wake up. Instead, set your alarm clock to go off every hour, on the hour, for the initial 8 hours after the procedure. Document the duration of the numbing medicine, and the relief you are getting from it. Do not go to sleep and attempt to complete it later. It will not be accurate. If you received sedation, it is likely that you were given a medication that may cause amnesia. Because of this, completing the diary at a later time may cause the information to be inaccurate. This information is needed to plan your care. Follow-up appointment: Keep your post-procedure follow-up evaluation appointment after the procedure (usually 2 weeks for most procedures, 6 weeks for radiofrequencies). DO NOT FORGET to bring you pain diary with you.   Expect: (What should I expect to see with my procedure?) From numbing medicine (AKA: Local Anesthetics): Numbness or decrease in pain. You may also experience some weakness, which if present, could last for the duration of the local anesthetic. Onset: Full effect within 15 minutes of injected. Duration: It will depend on the type of local anesthetic used. On the average, 1 to 8 hours.  From steroids (None used) From procedure: Some discomfort is to be expected once the numbing medicine wears off. This should be minimal if ice and heat are applied as instructed.  Call if: (When should I call?) You experience numbness and weakness that gets worse with time, as opposed to wearing off. New onset bowel or bladder incontinence. (Applies only to procedures done in the spine)  Emergency Numbers: Durning business hours (Monday - Thursday, 8:00 AM - 4:00 PM) (  Friday, 9:00 AM - 12:00 Noon): [phone] After hours: [phone] NOTE: If you are having a problem and are unable connect with, or to talk to a provider, then go to your nearest urgent care or emergency  department. If the problem is serious and urgent, please call 911. ____________________________________________________________________________________________    

## 2023-07-29 ENCOUNTER — Telehealth: Payer: Self-pay | Admitting: *Deleted

## 2023-07-29 NOTE — Telephone Encounter (Signed)
Attempted to call for post procedure follow-up. No answer, unable to leave a message. 

## 2023-08-10 NOTE — Progress Notes (Signed)
 Department: [facility]) Date: 08/11/2023  Event: Canceled/Rescheduled by patient..  Encounter Type: (PPE) Post-procedure evaluation.          Advance notice: Less than 24 hr notice.  Reason: Sickness.. She refers having nausea vomiting. Note: n/a (not applicable).

## 2023-08-11 ENCOUNTER — Ambulatory Visit (HOSPITAL_BASED_OUTPATIENT_CLINIC_OR_DEPARTMENT_OTHER): Admitting: Pain Medicine

## 2023-08-11 DIAGNOSIS — Z09 Encounter for follow-up examination after completed treatment for conditions other than malignant neoplasm: Secondary | ICD-10-CM

## 2023-08-11 DIAGNOSIS — M150 Primary generalized (osteo)arthritis: Secondary | ICD-10-CM

## 2023-08-11 DIAGNOSIS — Z91199 Patient's noncompliance with other medical treatment and regimen due to unspecified reason: Secondary | ICD-10-CM

## 2023-08-11 DIAGNOSIS — G8929 Other chronic pain: Secondary | ICD-10-CM

## 2023-08-11 DIAGNOSIS — M1711 Unilateral primary osteoarthritis, right knee: Secondary | ICD-10-CM

## 2023-08-13 ENCOUNTER — Other Ambulatory Visit (INDEPENDENT_AMBULATORY_CARE_PROVIDER_SITE_OTHER): Payer: Self-pay | Admitting: Neurology

## 2023-08-13 DIAGNOSIS — M79606 Pain in leg, unspecified: Secondary | ICD-10-CM

## 2023-08-14 ENCOUNTER — Encounter (INDEPENDENT_AMBULATORY_CARE_PROVIDER_SITE_OTHER): Payer: Self-pay

## 2023-08-16 NOTE — Progress Notes (Unsigned)
 PROVIDER NOTE: Interpretation of information contained herein should be left to medically-trained personnel. Specific patient instructions are provided elsewhere under Patient Instructions section of medical record. This document was created in part using AI and STT-dictation technology, any transcriptional errors that may result from this process are unintentional.  Patient: Charlene [name]  Service: E/M   PCP: Charlene Manna, MD  DOB: [birth year]  DOS: 08/17/2023  Provider: Eric DELENA Como, MD  MRN: [id number]  Delivery: Face-to-face  Specialty: Interventional Pain Management  Type: Established Patient  Setting: Ambulatory outpatient facility  Specialty designation: 09  Referring Prov.: Charlene Manna, MD  Location: Outpatient office facility       History of present illness (HPI) [name], a 63 y.o. year old female, is here today because of her Chronic pain of both knees [M25.561, M25.562, G89.29]. Charlene [name] primary complain today is No chief complaint on file.  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HCC); Chronic ankle pain (Left); Chronic foot pain (Left); Chronic lower extremity pain (2ry area of Pain) (Bilateral) (L>R); Chronic knee pain (4th area of Pain) (Bilateral) (L>R); Chronic low back pain (3ry area of Pain) (Bilateral) (L>R) w/ sciatica (Left); Chronic pain syndrome; DM type 2 with diabetic peripheral neuropathy (HCC); DDD (degenerative disc disease), lumbar; Lumbar facet arthropathy; Lumbar facet syndrome; Lumbar facet hypertrophy; Lumbar foraminal stenosis (L5-S1) (Right); Osteoarthritis of knee (Bilateral); Chronic musculoskeletal pain; Abnormal EMG (07/06/2017); Osteoarthritis involving multiple joints; 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); Knee pain; Back pain with left-sided sciatica;  Meningioma (HCC); Neck pain; Chronic pain disorder; Left leg weakness; Meralgia paraesthetica, left; Pain in both lower extremities; Arthropathy of lumbar facet joint; Chronic ankle and foot pain (1ry area of Pain) (Left); 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); 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); Spondylosis without myelopathy or radiculopathy, lumbosacral region; Chronic low back pain (Bilateral) w/o sciatica; Chronic sacroiliac joint pain (Right); Abnormal x-ray of lumbar spine (03/16/2021); Closed compression fracture of L3 lumbar vertebra, sequela; History of compression fracture of vertebral column; Spinal column pain; Chronic knee pain (Left); Osteoarthritis of knee (Left); Pain of patellofemoral joint (Left); Grade 1 Retrolisthesis of L2/L3; Lumbar facet joint pain; Chronic knee pain (Right); Low back pain of over 3 months duration; Intermittent low back pain; Mechanical low back pain; Multifactorial low back pain; and Osteoarthritis of knee (Right) on their pertinent problem list.  Pain Assessment: Severity of   is reported as a  /10. Location:    / . Onset:  . Quality:  . Timing:  . Modifying factor(s):  SABRA Vitals:  vitals were not taken for this visit.  BMI: Estimated body mass index is 30.3 kg/m as calculated from the following:   Height as of 07/28/23: 4' 11 (1.499 m).   Weight as of 07/28/23: 150 lb (68 kg).  Last encounter: 08/11/2023. Last procedure: 07/28/2023.  Reason for encounter: post-procedure evaluation and assessment.   Discussed the use of AI scribe software for clinical note transcription with the patient, who gave verbal consent to proceed.  History of Present Illness          Post-Procedure Evaluation   Type:  Gelsyn-3 Intra-articular Knee Injection #3  Laterality: Right (-RT) Level/approach: Lateral Imaging guidance: None required ([id number]) Anesthesia: Local anesthesia (1-2% Lidocaine ) Anxiolysis: None                 Sedation: No Sedation  DOS: 07/28/2023  Performed by: Charlene DELENA Como, MD  Purpose: Diagnostic/Therapeutic Indications: Knee arthralgia associated to osteoarthritis of the knee 1. Chronic knee pain (Right)   2. Osteoarthritis of knee (Right)   3. Osteoarthritis involving multiple joints   4. Encounter for therapeutic procedure   5. Postop check    NAS-11 score:   Pre-procedure: 1 /10   Post-procedure: 0-No pain/10   Discussed the use of AI scribe software for clinical note transcription with the patient, who gave verbal consent to proceed.  History of Present Illness   Charlene [name] is a 63 year old female who presents for a right knee injection with Gelsung.  She is undergoing a series of three Gelsung injections for her right knee, with this being the final injection. The injections aim to lubricate the joint and stimulate cartilage to produce more lubricant, potentially reducing inflammation and improving joint function.  Recently, she experienced a fall while ascending steps, injuring her left side and knee. She did not fall completely but sustained a knee injury. Her knee was asymptomatic prior to this incident. There is no evidence of fractures or severe injury from the fall.         Effectiveness:  Initial hour after procedure:   ***. Subsequent 4-6 hours post-procedure:   ***. Analgesia past initial 6 hours:   ***. Ongoing improvement:  Analgesic:  *** Function:    ***    ROM:    ***     Pharmacotherapy Assessment   Analgesic: Tramadol  50 mg, 1 tab PO QD. ABNORMAL UDS (09/03/2020) (+) for COCAINE. MME: 5 mg/day   Monitoring: [place] PMP: PDMP reviewed during this encounter.       Pharmacotherapy: No side-effects or adverse reactions reported. Compliance: No problems identified. Effectiveness: Clinically acceptable.  No notes on file  UDS:  Summary  Date Value Ref Range Status  05/11/2023 FINAL  Final    Comment:     ==================================================================== [employer] Select 13 (MW) ==================================================================== Test                             Result       Flag       Units  Drug Absent but Declared for Prescription Verification   Tramadol                        Not Detected UNEXPECTED ng/mg creat ==================================================================== Test                      Result    Flag   Units      Ref Range   Creatinine              21               mg/dL      >=79 ==================================================================== Declared Medications:  The flagging and interpretation on this report are based on the  following declared medications.  Unexpected results may arise from  inaccuracies in the declared medications.   **Note: The testing scope of this panel includes these medications:   Tramadol  (Ultram )   **Note: The testing scope of this panel does not include the  following reported medications:   Albuterol (Ventolin HFA)  Alendronate (Fosamax)  Amlodipine  (Norvasc )  Baclofen  (Lioresal )  Carvedilol  (Coreg )  Dicyclomine  (Bentyl )  Duloxetine  (Cymbalta )  Empagliflozin (Jardiance)  Glucagon  Hydralazine (Apresoline)  Insulin  (NovoLog )  Losartan (Cozaar)  Magnesium   Meloxicam  (Mobic )  Metformin (Glucophage)  Nortriptyline (Pamelor)  Ondansetron  (Zofran )  Pantoprazole  (Protonix )  Polyethylene Glycol (MiraLAX )  Pravastatin (Pravachol)  Pregabalin (Lyrica)  Sucralfate (Carafate)  Vitamin D  ==================================================================== For clinical consultation, please call [phone]. ====================================================================     No results found for: CBDTHCR No results found for: D8THCCBX No results found for: D9THCCBX  ROS  Constitutional: Denies any fever or chills Gastrointestinal: No reported hemesis, hematochezia,  vomiting, or acute GI distress Musculoskeletal: Denies any acute onset joint swelling, redness, loss of ROM, or weakness Neurological: No reported episodes of acute onset apraxia, aphasia, dysarthria, agnosia, amnesia, paralysis, loss of coordination, or loss of consciousness  Medication Review  Cholecalciferol , DULoxetine , FreeStyle Libre Sensor System, Glucagon HCl (Diagnostic), Insulin  Aspart FlexPen, Magnesium , albuterol, alendronate, amLODipine , baclofen , carvedilol , dicyclomine , empagliflozin, hydrALAZINE, insulin  glargine (2 Unit Dial), insulin  lispro, losartan, meloxicam , metFORMIN, nortriptyline, ondansetron , pantoprazole , polyethylene glycol, pravastatin, pregabalin, sucralfate, and traMADol   History Review  Allergy: Charlene [name] is allergic to semaglutide. Drug: Charlene [name]  reports no history of drug use. Alcohol:  reports that she does not currently use alcohol. Tobacco:  reports that she has never smoked. She has never used smokeless tobacco. Social: Charlene [name]  reports that she has never smoked. She has never used smokeless tobacco. She reports that she does not currently use alcohol. She reports that she does not use drugs. Medical:  has a past medical history of Arthritis, Degenerative disc disease, lumbar, Diabetes mellitus without complication (HCC), GERD (gastroesophageal reflux disease), Hypertension, Neuropathy, Polyneuropathy, and Polyneuropathy. Surgical: Charlene [name]  has a past surgical history that includes arthroscopic rotator cuff; Colonoscopy; Abdominal hysterectomy; endosopic sinus; Cataract extraction w/PHACO (Right, 11/25/2017); and Eye surgery. Family: family history includes Alcohol abuse in her father; Breast cancer in her maternal aunt; Cancer in her father; Pancreatic cancer in her maternal aunt.  Laboratory Chemistry Profile   Renal Lab Results  Component Value Date   BUN 38 (H) 01/18/2023   CREATININE 2.10 (H) 01/18/2023   BCR 17 10/12/2017   GFRAA >60  12/23/2018   GFRNONAA 26 (L) 01/18/2023    Hepatic Lab Results  Component Value Date   AST 29 01/18/2023   ALT 21 01/18/2023   ALBUMIN 3.9 01/18/2023   ALKPHOS 94 01/18/2023   LIPASE 27 01/18/2023    Electrolytes Lab Results  Component Value Date   NA 136 01/18/2023   K 4.6 01/18/2023   CL 100 01/18/2023   CALCIUM  9.1 01/18/2023   MG 2.0 10/12/2017    Bone Lab Results  Component Value Date   25OHVITD1 17 (L) 10/12/2017   25OHVITD2 <1.0 10/12/2017   25OHVITD3 17 10/12/2017    Inflammation (CRP: Acute Phase) (ESR: Chronic Phase) Lab Results  Component Value Date   CRP 4 10/12/2017   ESRSEDRATE 12 10/12/2017         Note: Above Lab results reviewed.  Recent Imaging Review  US  RENAL CLINICAL DATA:  CKD, proteinuria, anemia  EXAM: RENAL / URINARY TRACT ULTRASOUND COMPLETE  COMPARISON:  CT July 05, 2022  FINDINGS: Right Kidney:  Renal measurements: 10 x 4.6 x 4.5 cm = volume: 106.5 mL. Increased echogenicity. No mass or hydronephrosis visualized.  Left Kidney:  Renal measurements: 9.5 x 6.0 x 5.2 cm = volume: 154.6 mL. Increased echogenicity. Subtle hypoechoic 8 mm focus in the left kidney. No hydronephrosis visualized.  Bladder:  Appears normal for degree of bladder distention.  Other:  None.  IMPRESSION: 1. No hydronephrosis. 2. Increased echogenicity of the kidneys, as  can be seen in medical renal disease. 3. Subtle hypoechoic 8 mm focus in the left kidney, which may represent a cyst but is incompletely characterized. Consider follow-up ultrasound in 6 months.  Electronically Signed   By: Reyes Holder M.D.   On: 07/26/2023 09:51 Note: Reviewed        Physical Exam  Vitals: There were no vitals taken for this visit. BMI: Estimated body mass index is 30.3 kg/m as calculated from the following:   Height as of 07/28/23: 4' 11 (1.499 m).   Weight as of 07/28/23: 150 lb (68 kg). Ideal: Patient weight not recorded General appearance: Well  nourished, well developed, and well hydrated. In no apparent acute distress Mental status: Alert, oriented x 3 (person, place, & time)       Respiratory: No evidence of acute respiratory distress Eyes: PERLA   Assessment   Diagnosis Status  1. Chronic knee pain (4th area of Pain) (Bilateral) (L>R)   2. Osteoarthritis of knee (Bilateral)   3. Postop check    Controlled Controlled Controlled   Updated Problems: No problems updated.  Plan of Care  Problem-specific:  Assessment and Plan            Charlene [name] has a current medication list which includes the following long-term medication(s): albuterol, amlodipine , baclofen , carvedilol , cholecalciferol , dicyclomine , duloxetine , hydralazine, toujeo  max solostar, insulin  lispro, losartan, magnesium , nortriptyline, pantoprazole , pravastatin, pregabalin, sucralfate, and tramadol .  Pharmacotherapy (Medications Ordered): No orders of the defined types were placed in this encounter.  Orders:  No orders of the defined types were placed in this encounter.    Interventional Therapies  Risk Factors  Considerations:   NOTE: UNCONTROLLED IDDM (NO STEROIDS)  ABNORMAL UDS (09/03/2020) (+) for COCAINE       Planned  Pending:  (NO STEROIDS)  Therapeutic right Gelsyn-3 knee injection #5 (second of 2025) (07/14/2023)  Therapeutic right Gelsyn-3 knee injection series (3 of 3)  (07/14/2023) today I have recommended to the patient to consider the use of a TENS unit.  (Information provided)    Under consideration:   Possible bilateral lumbar facet RFA #1  Diagnostic bilateral genicular NB #1    Completed:   Diagnostic right lumbar facet MBB x5 (06/30/2023) (100/100/0) (no steroid) (Diagnostic - local anesthetics only.)  Diagnostic left lumbar facet MBB x2 (06/30/2023) (100/100/0) (no steroid) (Diagnostic - local anesthetics only.)  Therapeutic left L3-4 LESI x1 (02/11/2022) (100/100/75/100)  Therapeutic left L4-5 LESI x4  (09/21/2018) (100/100/50/70)  Therapeutic left L5 TFESI x4 (09/21/2018) (100/100/50/70)  Therapeutic left L5-S1 LESI x2 (02/25/2018) (100/100/100/100)  Therapeutic bilateral Hyalgan knee inj. x5 (02/25/2018) (100/100/100 x2 weeks/<50)  Therapeutic left IA steroid knee inj. x1 (11/23/2017) (100/100/0/0)  Therapeutic left IA Gelsyn-3 knee inj. x4 (06/30/2023) (100/100/50/50)  Therapeutic left IA Monovisc knee inj. x1 (03/18/2022) (100/100/50/50)    Therapeutic  Palliative (PRN) options:  (NO PRN PROCEDURES W/O MD F2F EVALUATION) Palliative L5 TFESI  Palliative L4-5 LESI   Palliative L5-S1 LESI   Palliative Hyalgan knee inj.     No follow-ups on file.    Recent Visits Date Type Provider Dept  07/28/23 Procedure visit Tanya Glisson, MD [facility]  07/14/23 Procedure visit Tanya Glisson, MD [facility]  06/30/23 Procedure visit Tanya Glisson, MD [facility]  06/24/23 Office Visit Patel, Seema K, NP [facility]  06/18/23 Office Visit Tanya Glisson, MD [facility]  Showing recent visits within past 90 days and meeting all other requirements Future Appointments  Date Type Provider Dept  08/17/23 Appointment Tanya Glisson, MD [facility]  09/24/23 Appointment Patel, Seema K, NP [facility]  Showing future appointments within next 90 days and meeting all other requirements  I discussed the assessment and treatment plan with the patient. The patient was provided an opportunity to ask questions and all were answered. The patient agreed with the plan and demonstrated an understanding of the instructions.  Patient advised to call back or seek an in-person evaluation if the symptoms or condition worsens.  Duration of encounter: *** minutes.  Total time on encounter, as per AMA guidelines included both the face-to-face and non-face-to-face time personally spent by the physician and/or other qualified health  care professional(s) on the day of the encounter (includes time in activities that require the physician or other qualified health care professional and does not include time in activities normally performed by clinical staff). Physician's time may include the following activities when performed: Preparing to see the patient (e.g., pre-charting review of records, searching for previously ordered imaging, lab work, and nerve conduction tests) Review of prior analgesic pharmacotherapies. Reviewing PMP Interpreting ordered tests (e.g., lab work, imaging, nerve conduction tests) Performing post-procedure evaluations, including interpretation of diagnostic procedures Obtaining and/or reviewing separately obtained history Performing a medically appropriate examination and/or evaluation Counseling and educating the patient/family/caregiver Ordering medications, tests, or procedures Referring and communicating with other health care professionals (when not separately reported) Documenting clinical information in the electronic or other health record Independently interpreting results (not separately reported) and communicating results to the patient/ family/caregiver Care coordination (not separately reported)  Note by: Glisson DELENA Tanya, MD (TTS and AI technology used. I apologize for any typographical errors that were not detected and corrected.) Date: 08/17/2023; Time: 6:37 PM

## 2023-08-17 ENCOUNTER — Encounter: Payer: Self-pay | Admitting: Pain Medicine

## 2023-08-17 ENCOUNTER — Ambulatory Visit: Attending: Pain Medicine | Admitting: Pain Medicine

## 2023-08-17 VITALS — BP 164/84 | HR 80 | Temp 97.7°F | Resp 16 | Ht 59.0 in | Wt 150.0 lb

## 2023-08-17 DIAGNOSIS — M4310 Spondylolisthesis, site unspecified: Secondary | ICD-10-CM | POA: Insufficient documentation

## 2023-08-17 DIAGNOSIS — M25561 Pain in right knee: Secondary | ICD-10-CM | POA: Diagnosis not present

## 2023-08-17 DIAGNOSIS — M25562 Pain in left knee: Secondary | ICD-10-CM | POA: Diagnosis not present

## 2023-08-17 DIAGNOSIS — M5459 Other low back pain: Secondary | ICD-10-CM | POA: Insufficient documentation

## 2023-08-17 DIAGNOSIS — M47816 Spondylosis without myelopathy or radiculopathy, lumbar region: Secondary | ICD-10-CM | POA: Insufficient documentation

## 2023-08-17 DIAGNOSIS — M47817 Spondylosis without myelopathy or radiculopathy, lumbosacral region: Secondary | ICD-10-CM | POA: Diagnosis present

## 2023-08-17 DIAGNOSIS — M545 Low back pain, unspecified: Secondary | ICD-10-CM | POA: Diagnosis present

## 2023-08-17 DIAGNOSIS — M4316 Spondylolisthesis, lumbar region: Secondary | ICD-10-CM | POA: Diagnosis present

## 2023-08-17 DIAGNOSIS — G8929 Other chronic pain: Secondary | ICD-10-CM | POA: Diagnosis present

## 2023-08-17 DIAGNOSIS — M170 Bilateral primary osteoarthritis of knee: Secondary | ICD-10-CM | POA: Insufficient documentation

## 2023-08-17 DIAGNOSIS — Z09 Encounter for follow-up examination after completed treatment for conditions other than malignant neoplasm: Secondary | ICD-10-CM | POA: Diagnosis present

## 2023-08-17 NOTE — Patient Instructions (Signed)

## 2023-08-17 NOTE — Progress Notes (Signed)
 Safety precautions to be maintained throughout the outpatient stay will include: orient to surroundings, keep bed in low position, maintain call bell within reach at all times, provide assistance with transfer out of bed and ambulation.

## 2023-08-19 ENCOUNTER — Other Ambulatory Visit (INDEPENDENT_AMBULATORY_CARE_PROVIDER_SITE_OTHER): Payer: Self-pay

## 2023-08-19 DIAGNOSIS — M79606 Pain in leg, unspecified: Secondary | ICD-10-CM | POA: Diagnosis not present

## 2023-08-20 LAB — VAS US ABI WITH/WO TBI
Left ABI: 1.09
Right ABI: 1.13

## 2023-09-01 ENCOUNTER — Telehealth: Payer: Self-pay | Admitting: Pain Medicine

## 2023-09-01 NOTE — Telephone Encounter (Signed)
 Patient called stated that she is having pain in her right knee again. Please give patient a call. TY

## 2023-09-01 NOTE — Telephone Encounter (Signed)
 Returned patient phone call re; knee pain, right side. Has tried tyelnol and tramadol  and nothing is relieving the pain.  She had gelsyn injection on 07/28/23.  She also c/o lower back pain bilaterally.  I have asked patient if she would like to be seen sooner or if she could wait until 09/22/23.  She is scheduled for BLF on 09/22/23 and elects to keep that appt and will discuss increased right knee pain at that time.  Will continue OTC meds and comfort measures I.e. ice/heat, topicals, elevation.  Patient verbalizes u/o information given.

## 2023-09-22 ENCOUNTER — Ambulatory Visit
Admission: RE | Admit: 2023-09-22 | Discharge: 2023-09-22 | Disposition: A | Discharge status: Home / Self Care | Source: Ambulatory Visit | Attending: Pain Medicine | Admitting: Pain Medicine

## 2023-09-22 ENCOUNTER — Ambulatory Visit (HOSPITAL_BASED_OUTPATIENT_CLINIC_OR_DEPARTMENT_OTHER): Admitting: Pain Medicine

## 2023-09-22 ENCOUNTER — Ambulatory Visit: Admitting: Pain Medicine

## 2023-09-22 ENCOUNTER — Encounter: Payer: Self-pay | Admitting: Pain Medicine

## 2023-09-22 VITALS — BP 122/84 | HR 83 | Temp 98.0°F | Resp 15 | Ht 59.0 in | Wt 150.0 lb

## 2023-09-22 DIAGNOSIS — G894 Chronic pain syndrome: Secondary | ICD-10-CM

## 2023-09-22 DIAGNOSIS — M4310 Spondylolisthesis, site unspecified: Secondary | ICD-10-CM | POA: Diagnosis present

## 2023-09-22 DIAGNOSIS — M4316 Spondylolisthesis, lumbar region: Secondary | ICD-10-CM | POA: Insufficient documentation

## 2023-09-22 DIAGNOSIS — M79605 Pain in left leg: Secondary | ICD-10-CM | POA: Insufficient documentation

## 2023-09-22 DIAGNOSIS — E1142 Type 2 diabetes mellitus with diabetic polyneuropathy: Secondary | ICD-10-CM | POA: Insufficient documentation

## 2023-09-22 DIAGNOSIS — M47817 Spondylosis without myelopathy or radiculopathy, lumbosacral region: Secondary | ICD-10-CM | POA: Diagnosis present

## 2023-09-22 DIAGNOSIS — M25561 Pain in right knee: Secondary | ICD-10-CM | POA: Insufficient documentation

## 2023-09-22 DIAGNOSIS — Z79891 Long term (current) use of opiate analgesic: Secondary | ICD-10-CM

## 2023-09-22 DIAGNOSIS — M25572 Pain in left ankle and joints of left foot: Secondary | ICD-10-CM

## 2023-09-22 DIAGNOSIS — Z79899 Other long term (current) drug therapy: Secondary | ICD-10-CM | POA: Diagnosis present

## 2023-09-22 DIAGNOSIS — M1711 Unilateral primary osteoarthritis, right knee: Secondary | ICD-10-CM

## 2023-09-22 DIAGNOSIS — G8929 Other chronic pain: Secondary | ICD-10-CM | POA: Diagnosis present

## 2023-09-22 DIAGNOSIS — M25562 Pain in left knee: Secondary | ICD-10-CM | POA: Insufficient documentation

## 2023-09-22 DIAGNOSIS — M545 Low back pain, unspecified: Secondary | ICD-10-CM | POA: Diagnosis present

## 2023-09-22 DIAGNOSIS — M5459 Other low back pain: Secondary | ICD-10-CM | POA: Insufficient documentation

## 2023-09-22 DIAGNOSIS — M47816 Spondylosis without myelopathy or radiculopathy, lumbar region: Secondary | ICD-10-CM | POA: Diagnosis present

## 2023-09-22 DIAGNOSIS — M79604 Pain in right leg: Secondary | ICD-10-CM | POA: Insufficient documentation

## 2023-09-22 MED ORDER — ROPIVACAINE HCL 2 MG/ML IJ SOLN
18.0000 mL | Freq: Once | INTRAMUSCULAR | Status: AC
  Administered 2023-09-22: 20 mL via PERINEURAL
  Filled 2023-09-22: qty 20

## 2023-09-22 MED ORDER — IOHEXOL 180 MG/ML  SOLN: 10.0000 mL | Freq: Once | INTRAMUSCULAR | Status: DC

## 2023-09-22 MED ORDER — TRAMADOL HCL 50 MG PO TABS: 50.0000 mg | ORAL_TABLET | Freq: Every day | ORAL | 5 refills | Status: AC

## 2023-09-22 MED ORDER — LIDOCAINE HCL 2 % IJ SOLN
20.0000 mL | Freq: Once | INTRAMUSCULAR | Status: AC
  Administered 2023-09-22: 400 mg
  Filled 2023-09-22: qty 20

## 2023-09-22 MED ORDER — PENTAFLUOROPROP-TETRAFLUOROETH EX AERO
INHALATION_SPRAY | Freq: Once | CUTANEOUS | Status: AC
  Administered 2023-09-22: 30 via TOPICAL

## 2023-09-22 NOTE — Progress Notes (Signed)
 Safety precautions to be maintained throughout the outpatient stay will include: orient to surroundings, keep bed in low position, maintain call bell within reach at all times, provide assistance with transfer out of bed and ambulation.

## 2023-09-22 NOTE — Progress Notes (Signed)
 PROVIDER NOTE: Interpretation of information contained herein should be left to medically-trained personnel. Specific patient instructions are provided elsewhere under Patient Instructions section of medical record. This document was created in part using AI and STT-dictation technology, any transcriptional errors that may result from this process are unintentional.  Patient: Charlene [name]  Service: E/M   PCP: Sadie Manna, MD  DOB: 06-10-1960  DOS: 09/22/2023  Provider: Eric DELENA Como, MD  MRN: [id number]  Delivery: Face-to-face  Specialty: Interventional Pain Management  Type: Established Patient  Setting: Ambulatory outpatient facility  Specialty designation: 09  Referring Prov.: Sadie Manna, MD  Location: Outpatient office facility       History of present illness (HPI) Charlene [name], a 63 y.o. year old female, is here today because of her Chronic bilateral low back pain without sciatica [M54.50, G89.29]. Charlene [name] primary complain today is Back Pain (lower) and Knee Pain (Inner aspect of right knee)  Pertinent problems: [name] has Burning sensation of feet; Neuropathic pain; Neuropathy; Lower extremity numbness and tingling (Left); Polyneuropathy associated with underlying disease (HCC); Chronic ankle pain (Left); Chronic foot pain (Left); Chronic lower extremity pain (2ry area of Pain) (Bilateral) (L>R); Chronic knee pain (4th area of Pain) (Bilateral) (L>R); Chronic low back pain (3ry area of Pain) (Bilateral) (L>R) w/ sciatica (Left); Chronic pain syndrome; DM type 2 with diabetic peripheral neuropathy (HCC); DDD (degenerative disc disease), lumbar; Lumbar facet arthropathy; Lumbar facet syndrome; Lumbar facet hypertrophy; Lumbar foraminal stenosis (L5-S1) (Right); Osteoarthritis of knee (Bilateral); Chronic musculoskeletal pain; Abnormal EMG (07/06/2017); Osteoarthritis involving multiple joints; 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); Knee  pain; Back pain with left-sided sciatica; Meningioma (HCC); Neck pain; Chronic pain disorder; Left leg weakness; Meralgia paraesthetica, left; Pain in both lower extremities; Arthropathy of lumbar facet joint; Chronic ankle and foot pain (1ry area of Pain) (Left); 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); 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); Spondylosis without myelopathy or radiculopathy, lumbosacral region; Chronic low back pain (Bilateral) w/o sciatica; Chronic sacroiliac joint pain (Right); Abnormal x-ray of lumbar spine (03/16/2021); Closed compression fracture of L3 lumbar vertebra, sequela; History of compression fracture of vertebral column; Spinal column pain; Chronic knee pain (Left); Osteoarthritis of knee (Left); Pain of patellofemoral joint (Left); Grade 1 Retrolisthesis of L2/L3; Lumbar facet joint pain; Chronic knee pain (Right); Low back pain of over 3 months duration; Intermittent low back pain; Mechanical low back pain; Multifactorial low back pain; and Osteoarthritis of knee (Right) on their pertinent problem list.  Pain Assessment: Severity of Chronic pain is reported as a 1 /10. Location: Back Lower, Right, Left/denies. Onset:  . Quality: Aching, Constant, Sharp, Stabbing. Timing:  . Modifying factor(s): resting, procedures, extra strength Tylenol , Tramadol . Vitals:  height is 4' 11 (1.499 m) and weight is 150 lb (68 kg). Her temporal temperature is 98 F (36.7 C). Her blood pressure is 122/84 and her pulse is 83. Her respiration is 15 and oxygen saturation is 100%.  BMI: Estimated body mass index is 30.3 kg/m as calculated from the following:   Height as of this encounter: 4' 11 (1.499 m).   Weight as of this encounter: 150 lb (68 kg).  Last encounter: 08/17/2023. Last procedure: 07/28/2023.  Reason for encounter: medication management.  The patient was scheduled to come in the day after tomorrow for her medication refill, but she indicated having transportation  problems and wanted to see if we could refill her medication today.  The patient indicates doing  well with the current medication regimen. No adverse reactions or side effects reported to the medications.    Pharmacotherapy Assessment   Analgesic: Tramadol  50 mg, 1 tab PO QD. ABNORMAL UDS (09/03/2020) (+) for COCAINE. MME: 5 mg/day   Monitoring: [place] PMP: PDMP not reviewed this encounter.       Pharmacotherapy: No side-effects or adverse reactions reported. Compliance: No problems identified. Effectiveness: Clinically acceptable.  [name], RN  09/22/2023  8:49 AM  Sign when Signing Visit Nursing Pain Medication Assessment:  Safety precautions to be maintained throughout the outpatient stay will include: orient to surroundings, keep bed in low position, maintain call bell within reach at all times, provide assistance with transfer out of bed and ambulation.  Medication Inspection Compliance: Pill count conducted under aseptic conditions, in front of the patient. Neither the pills nor the bottle was removed from the patient's sight at any time. Once count was completed pills were immediately returned to the patient in their original bottle.  Medication: Tramadol  (Ultram ) Pill/Patch Count: 05 of 30 pills/patches remain Pill/Patch Appearance: Markings consistent with prescribed medication Bottle Appearance: Standard pharmacy container. Clearly labeled. Filled Date: 07 / 07 / 2025 Last Medication intake:  Day before yesterday  [name], RN  09/22/2023  8:21 AM  Sign when Signing Visit Safety precautions to be maintained throughout the outpatient stay will include: orient to surroundings, keep bed in low position, maintain call bell within reach at all times, provide assistance with transfer out of bed and ambulation.     UDS:  Summary  Date Value Ref Range Status  05/11/2023 FINAL  Final    Comment:     ==================================================================== [employer] Select 13 (MW) ==================================================================== Test                             Result       Flag       Units  Drug Absent but Declared for Prescription Verification   Tramadol                        Not Detected UNEXPECTED ng/mg creat ==================================================================== Test                      Result    Flag   Units      Ref Range   Creatinine              21               mg/dL      >=79 ==================================================================== Declared Medications:  The flagging and interpretation on this report are based on the  following declared medications.  Unexpected results may arise from  inaccuracies in the declared medications.   **Note: The testing scope of this panel includes these medications:   Tramadol  (Ultram )   **Note: The testing scope of this panel does not include the  following reported medications:   Albuterol (Ventolin HFA)  Alendronate (Fosamax)  Amlodipine  (Norvasc )  Baclofen  (Lioresal )  Carvedilol  (Coreg )  Dicyclomine  (Bentyl )  Duloxetine  (Cymbalta )  Empagliflozin (Jardiance)  Glucagon  Hydralazine (Apresoline)  Insulin  (NovoLog )  Losartan (Cozaar)  Magnesium   Meloxicam  (Mobic )  Metformin (Glucophage)  Nortriptyline (Pamelor)  Ondansetron  (Zofran )  Pantoprazole  (Protonix )  Polyethylene Glycol (MiraLAX )  Pravastatin (Pravachol)  Pregabalin (Lyrica)  Sucralfate (Carafate)  Vitamin D  ==================================================================== For clinical consultation, please call [phone]. ====================================================================     No results found  for: CBDTHCR No results found for: D8THCCBX No results found for: D9THCCBX  ROS  Constitutional: Denies any fever or chills Gastrointestinal: No reported hemesis, hematochezia,  vomiting, or acute GI distress Musculoskeletal: Denies any acute onset joint swelling, redness, loss of ROM, or weakness Neurological: No reported episodes of acute onset apraxia, aphasia, dysarthria, agnosia, amnesia, paralysis, loss of coordination, or loss of consciousness  Medication Review  Cholecalciferol , DULoxetine , FreeStyle Libre Sensor System, Glucagon HCl (Diagnostic), Insulin  Aspart FlexPen, Magnesium , albuterol, alendronate, amLODipine , baclofen , carvedilol , dicyclomine , empagliflozin, hydrALAZINE, insulin  glargine (2 Unit Dial), insulin  lispro, losartan, meloxicam , metFORMIN, nortriptyline, ondansetron , pantoprazole , polyethylene glycol, pravastatin, pregabalin, sucralfate, and traMADol   History Review  Allergy: Ms. Illingworth is allergic to semaglutide. Drug: Ms. Lehmkuhl  reports no history of drug use. Alcohol:  reports that she does not currently use alcohol. Tobacco:  reports that she has never smoked. She has never used smokeless tobacco. Social: Ms. Garver  reports that she has never smoked. She has never used smokeless tobacco. She reports that she does not currently use alcohol. She reports that she does not use drugs. Medical:  has a past medical history of Arthritis, Degenerative disc disease, lumbar, Diabetes mellitus without complication (HCC), GERD (gastroesophageal reflux disease), Hypertension, Neuropathy, Polyneuropathy, and Polyneuropathy. Surgical: Ms. Winegar  has a past surgical history that includes arthroscopic rotator cuff; Colonoscopy; Abdominal hysterectomy; endosopic sinus; Cataract extraction w/PHACO (Right, 11/25/2017); and Eye surgery. Family: family history includes Alcohol abuse in her father; Breast cancer in her maternal aunt; Cancer in her father; Pancreatic cancer in her maternal aunt.  Laboratory Chemistry Profile   Renal Lab Results  Component Value Date   BUN 38 (H) 01/18/2023   CREATININE 2.10 (H) 01/18/2023   BCR 17 10/12/2017   GFRAA >60  12/23/2018   GFRNONAA 26 (L) 01/18/2023    Hepatic Lab Results  Component Value Date   AST 29 01/18/2023   ALT 21 01/18/2023   ALBUMIN 3.9 01/18/2023   ALKPHOS 94 01/18/2023   LIPASE 27 01/18/2023    Electrolytes Lab Results  Component Value Date   NA 136 01/18/2023   K 4.6 01/18/2023   CL 100 01/18/2023   CALCIUM  9.1 01/18/2023   MG 2.0 10/12/2017    Bone Lab Results  Component Value Date   25OHVITD1 17 (L) 10/12/2017   25OHVITD2 <1.0 10/12/2017   25OHVITD3 17 10/12/2017    Inflammation (CRP: Acute Phase) (ESR: Chronic Phase) Lab Results  Component Value Date   CRP 4 10/12/2017   ESRSEDRATE 12 10/12/2017         Note: Above Lab results reviewed.  Recent Imaging Review  DG PAIN CLINIC C-ARM 1-60 MIN NO REPORT Fluoro was used, but no Radiologist interpretation will be provided.  Please refer to NOTES tab for provider progress note. Note: Reviewed        Physical Exam  Vitals: BP 122/84   Pulse 83   Temp 98 F (36.7 C) (Temporal)   Resp 15   Ht 4' 11 (1.499 m)   Wt 150 lb (68 kg)   SpO2 100%   BMI 30.30 kg/m  BMI: Estimated body mass index is 30.3 kg/m as calculated from the following:   Height as of this encounter: 4' 11 (1.499 m).   Weight as of this encounter: 150 lb (68 kg). Ideal: Patient must be at least 60 in tall to calculate ideal body weight General appearance: Well nourished, well developed, and well hydrated. In no apparent acute distress Mental status: Alert, oriented x  3 (person, place, & time)       Respiratory: No evidence of acute respiratory distress Eyes: PERLA   Assessment   Diagnosis Status  1. Chronic low back pain (Bilateral) w/o sciatica   2. 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 Grade 1 Retrolisthesis of L2/L3   4. Low back pain of over 3 months duration   5. Lumbar facet joint pain   6. Lumbar facet arthropathy   7. Lumbar facet hypertrophy   8. Lumbar facet syndrome   9. Spondylosis without  myelopathy or radiculopathy, lumbosacral region   10. Chronic knee pain (Right)   11. Osteoarthritis of knee (Right)   12. Chronic pain syndrome   13. 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 Chronic ankle and foot pain (1ry area of Pain) (Left)   15. Chronic knee pain (4th area of Pain) (Bilateral) (L>R)   16. Chronic lower extremity pain (2ry area of Pain) (Bilateral) (L>R)   17. Pharmacologic therapy   18. Chronic use of opiate for therapeutic purpose   19. Encounter for medication management   20. Encounter for chronic pain management   21. Chronic bilateral low back pain without sciatica   22. Anterolisthesis of lumbar spine   23. Retrolisthesis   24. Facet hypertrophy of lumbar region   25. Lumbar facet joint syndrome    Controlled Controlled Controlled   Updated Problems: No problems updated.  Plan of Care  Problem-specific:  Assessment and Plan            [name] has a current medication list which includes the following long-term medication(s): albuterol, amlodipine , baclofen , carvedilol , cholecalciferol , dicyclomine , duloxetine , hydralazine, toujeo  max solostar, insulin  lispro, losartan, magnesium , nortriptyline, pantoprazole , pravastatin, pregabalin, sucralfate, and tramadol .  Pharmacotherapy (Medications Ordered): Meds ordered this encounter  Medications   iohexol  (OMNIPAQUE ) 180 MG/ML injection 10 mL    Must be Myelogram-compatible. If not available, you may substitute with a water-soluble, non-ionic, hypoallergenic, myelogram-compatible radiological contrast medium.   lidocaine  (XYLOCAINE ) 2 % (with pres) injection 400 mg   pentafluoroprop-tetrafluoroeth (GEBAUERS) aerosol   ropivacaine  (PF) 2 mg/mL (0.2%) (NAROPIN ) injection 18 mL   traMADol  (ULTRAM ) 50 MG tablet    Sig: Take 1 tablet (50 mg total) by mouth daily. Must last 30 days    Dispense:  30 tablet    Refill:  5    Chronic Pain: STOP Act (Not applicable) Fill 1 day early if  closed on refill date. Avoid benzodiazepines within 8 hours of opioids   Orders:  Orders Placed This Encounter  Procedures   LUMBAR FACET(MEDIAL BRANCH NERVE BLOCK) MBNB    Scheduling Instructions:     Procedure: Lumbar facet block (AKA.: Lumbosacral medial branch nerve block)     Side: Bilateral     Level: L3-4, L4-5, and L5-S1 Facets (L2, L3, L4, L5, and S1 Medial Branch Nerves)     Sedation: Patient's choice.     Date: 09/22/2023    Where will this procedure be performed?:   [facility]   KNEE INJECTION    Indications: Knee arthralgia (pain) due to osteoarthritis (OA) Imaging: None (CPT-20610) Nursing: Please request pharmacy to have Monovisc (Hyaluronan) available in office.    Standing Status:   Future    Expected Date:   10/06/2023    Expiration Date:   12/22/2023    Scheduling Instructions:     Procedure: Knee injection Monovisc (hyaluronan 22mg /mL) (88mg /31mL)     Treatment No.: 1  of 1     Level: Intra-articular     Laterality: Right Knee     Sedation: Patient's choice.     Timeframe: in two (2) weeks     Total Hyaluronic acid (AKA hyaluronan or hyaluronate) per injection: 88 mg (22.0 mg/mL x 4 mL). Series of 1 injection.    Where will this procedure be performed?:   [facility]   DG PAIN CLINIC C-ARM 1-60 MIN NO REPORT    Intraoperative interpretation by procedural physician at [facility].    Standing Status:   Standing    Number of Occurrences:   1    Reason for exam::   Assistance in needle guidance and placement for procedures requiring needle placement in or near specific anatomical locations not easily accessible without such assistance.   Informed Consent Details: Physician/Practitioner Attestation; Transcribe to consent form and obtain patient signature    Nursing Order: Transcribe to consent form and obtain patient signature. Note: Always confirm laterality of pain with Ms. Chesley, before procedure.    Physician/Practitioner attestation  of informed consent for procedure/surgical case:   I, the physician/practitioner, attest that I have discussed with the patient the benefits, risks, side effects, alternatives, likelihood of achieving goals and potential problems during recovery for the procedure that I have provided informed consent.    Procedure:   Lumbar Facet Block  under fluoroscopic guidance    Physician/Practitioner performing the procedure:   Adelynne Joerger A. Tanya MD    Indication/Reason:   Low Back Pain, with our without leg pain, due to Facet Joint Arthralgia (Joint Pain) Spondylosis (Arthritis of the Spine), without myelopathy or radiculopathy (Nerve Damage).   Provide equipment / supplies at bedside    Procedure tray: Block Tray (Disposable  single use) Skin infiltration needle: Regular 1.5-in, 25-G, (x1) Block Needle type: Spinal Amount/quantity: 4 Size: Regular (3.5-inch) Gauge: 22G    Standing Status:   Standing    Number of Occurrences:   1    Specify:   Block Tray     Interventional Therapies  Risk Factors  Considerations:   NOTE: UNCONTROLLED IDDM (NO STEROIDS)  ABNORMAL UDS (09/03/2020) (+) for COCAINE       Planned  Pending:  (NO STEROIDS)  Palliative bilateral lumbar facet MBB #R6L3 with possible RFA follow-up. (07/14/2023) today I have recommended to the patient to consider the use of a TENS unit.  (Information provided)    Under consideration:   Possible bilateral lumbar facet RFA #1  Diagnostic bilateral genicular NB #1    Completed:   Diagnostic right lumbar facet MBB x5 (06/30/2023) (100/100/0) (no steroid) (Diagnostic - local anesthetics only.)  Diagnostic left lumbar facet MBB x2 (06/30/2023) (100/100/0) (no steroid) (Diagnostic - local anesthetics only.)  Therapeutic left L3-4 LESI x1 (02/11/2022) (100/100/75/100)  Therapeutic left L4-5 LESI x4 (09/21/2018) (100/100/50/70)  Therapeutic left L5 TFESI x4 (09/21/2018) (100/100/50/70)  Therapeutic left L5-S1 LESI x2 (02/25/2018)  (100/100/100/100)  Therapeutic bilateral Hyalgan knee inj. x5 (02/25/2018) (100/100/100 x2 weeks/<50)  Therapeutic left IA steroid knee inj. x1 (11/23/2017) (100/100/0/0)  Therapeutic left IA Gelsyn-3 knee inj. x4 (06/30/2023) (100/100/50/50)  Therapeutic left IA Monovisc knee inj. x1 (03/18/2022) (100/100/50/50)    Therapeutic  Palliative (PRN) options:  (NO PRN PROCEDURES W/O MD F2F EVALUATION) Palliative L5 TFESI  Palliative L4-5 LESI   Palliative L5-S1 LESI   Palliative Hyalgan knee inj.      Return in about 2 weeks (around 10/06/2023) for Eval-day (M,W), (Face2F), (PPE) + (NS) (R) IA Monovisc  Knee inj. #1.    Recent Visits Date Type Provider Dept  08/17/23 Office Visit Tanya Glisson, MD [facility]  07/28/23 Procedure visit Tanya Glisson, MD [facility]  07/14/23 Procedure visit Tanya Glisson, MD [facility]  06/30/23 Procedure visit Tanya Glisson, MD [facility]  06/24/23 Office Visit Patel, Seema K, NP [facility]  Showing recent visits within past 90 days and meeting all other requirements Today's Visits Date Type Provider Dept  09/22/23 Procedure visit Tanya Glisson, MD [facility]  Showing today's visits and meeting all other requirements Future Appointments Date Type Provider Dept  10/07/23 Appointment Tanya Glisson, MD [facility]  12/21/23 Appointment Patel, Seema K, NP [facility]  Showing future appointments within next 90 days and meeting all other requirements  I discussed the assessment and treatment plan with the patient. The patient was provided an opportunity to ask questions and all were answered. The patient agreed with the plan and demonstrated an understanding of the instructions.  Patient advised to call back or seek an in-person evaluation if the symptoms or condition worsens.  Duration of encounter: 30 minutes.  Total time on encounter, as per  AMA guidelines included both the face-to-face and non-face-to-face time personally spent by the physician and/or other qualified health care professional(s) on the day of the encounter (includes time in activities that require the physician or other qualified health care professional and does not include time in activities normally performed by clinical staff). Physician's time may include the following activities when performed: Preparing to see the patient (e.g., pre-charting review of records, searching for previously ordered imaging, lab work, and nerve conduction tests) Review of prior analgesic pharmacotherapies. Reviewing PMP Interpreting ordered tests (e.g., lab work, imaging, nerve conduction tests) Performing post-procedure evaluations, including interpretation of diagnostic procedures Obtaining and/or reviewing separately obtained history Performing a medically appropriate examination and/or evaluation Counseling and educating the patient/family/caregiver Ordering medications, tests, or procedures Referring and communicating with other health care professionals (when not separately reported) Documenting clinical information in the electronic or other health record Independently interpreting results (not separately reported) and communicating results to the patient/ family/caregiver Care coordination (not separately reported)  Note by: Glisson DELENA Tanya, MD (TTS and AI technology used. I apologize for any typographical errors that were not detected and corrected.) Date: 09/22/2023; Time: 8:53 AM

## 2023-09-22 NOTE — Progress Notes (Signed)
 PROVIDER NOTE: Interpretation of information contained herein should be left to medically-trained personnel. Specific patient instructions are provided elsewhere under Patient Instructions section of medical record. This document was created in part using STT-dictation technology, any transcriptional errors that may result from this process are unintentional.  Patient: Charlene [name] Type: Established DOB: [birth year] MRN: [id number] PCP: Sadie Manna, MD  Service: Procedure DOS: 09/22/2023 Setting: Ambulatory Location: Ambulatory outpatient facility Delivery: Face-to-face Provider: Eric DELENA Como, MD Specialty: Interventional Pain Management Specialty designation: 09 Location: Outpatient facility Ref. Prov.: Sadie Manna, MD       Interventional Therapy   Type: Lumbar Facet, Medial Branch Block(s)   R6L3 (w/o steroids) Laterality: Bilateral  Level: L2, L3, L4, L5, and S1 Medial Branch/Dorsal Rami Level(s). Injecting these levels blocks the L3-4, L4-5, and L5-S1 lumbar facet joints. Imaging: Fluoroscopic guidance Spinal ([id number]) Anesthesia: Local anesthesia (1-2% Lidocaine ) Anxiolysis: None no driver.         Sedation: No Sedation                       DOS: 09/22/2023 Performed by: Eric DELENA Como, MD  Primary Purpose: Diagnostic/Therapeutic Indications: Low back pain severe enough to impact quality of life or function. 1. Chronic low back pain (Bilateral) w/o sciatica   2. 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 Grade 1 Retrolisthesis of L2/L3   4. Low back pain of over 3 months duration   5. Lumbar facet joint pain   6. Lumbar facet arthropathy   7. Lumbar facet hypertrophy   8. Lumbar facet syndrome   9. Spondylosis without myelopathy or radiculopathy, lumbosacral region    NAS-11 Pain score:   Pre-procedure: 1 /10   Post-procedure: 0-No pain/10   Note: The patient indicates having an increase right knee pain.  Would like to have the  right knee treated.  Because of her uncontrolled diabetes we will be doing so with gel.    Position / Prep / Materials:  Position: Prone  Prep solution: ChloraPrep (2% chlorhexidine gluconate and 70% isopropyl alcohol) Area Prepped: Posterolateral Lumbosacral Spine (Wide prep: From the lower border of the scapula down to the end of the tailbone and from flank to flank.)  Materials:  Tray: Block Needle(s):  Type: Spinal  Gauge (G): 25  Length: 3.5-in Qty: 4     H&P (Pre-op Assessment):  Charlene [name] is a 63 y.o. (year old), female patient, seen today for interventional treatment. She  has a past surgical history that includes arthroscopic rotator cuff; Colonoscopy; Abdominal hysterectomy; endosopic sinus; Cataract extraction w/PHACO (Right, 11/25/2017); and Eye surgery. Charlene [name] has a current medication list which includes the following prescription(s): albuterol, alendronate, amlodipine , baclofen , carvedilol , cholecalciferol , freestyle libre sensor system, dicyclomine , duloxetine , empagliflozin, glucagon hcl (diagnostic), hydralazine, insulin  aspart flexpen, toujeo  max solostar, insulin  lispro, losartan, magnesium , meloxicam , metformin, nortriptyline, ondansetron , pantoprazole , polyethylene glycol, pravastatin, pregabalin, sucralfate, and tramadol , and the following Facility-Administered Medications: iohexol . Her primarily concern today is the Back Pain (lower) and Knee Pain (Inner aspect of right knee)  Initial Vital Signs:  Pulse/HCG Rate: 83ECG Heart Rate: 80 Temp: 98 F (36.7 C) Resp: 16 BP: 130/76 SpO2: 97 %  BMI: Estimated body mass index is 30.3 kg/m as calculated from the following:   Height as of this encounter: 4' 11 (1.499 m).   Weight as of this encounter: 150 lb (68 kg).  Risk Assessment: Allergies: Reviewed. She is allergic to semaglutide.  Allergy Precautions: None required Coagulopathies: Reviewed.  None identified.  Blood-thinner therapy: None at this time Active  Infection(s): Reviewed. None identified. Charlene [name] is afebrile  Site Confirmation: Charlene [name] was asked to confirm the procedure and laterality before marking the site Procedure checklist: Completed Consent: Before the procedure and under the influence of no sedative(s), amnesic(s), or anxiolytics, the patient was informed of the treatment options, risks and possible complications. To fulfill our ethical and legal obligations, as recommended by the American Medical Association's Code of Ethics, I have informed the patient of my clinical impression; the nature and purpose of the treatment or procedure; the risks, benefits, and possible complications of the intervention; the alternatives, including doing nothing; the risk(s) and benefit(s) of the alternative treatment(s) or procedure(s); and the risk(s) and benefit(s) of doing nothing. The patient was provided information about the general risks and possible complications associated with the procedure. These may include, but are not limited to: failure to achieve desired goals, infection, bleeding, organ or nerve damage, allergic reactions, paralysis, and death. In addition, the patient was informed of those risks and complications associated to Spine-related procedures, such as failure to decrease pain; infection (i.e.: Meningitis, epidural or intraspinal abscess); bleeding (i.e.: epidural hematoma, subarachnoid hemorrhage, or any other type of intraspinal or peri-dural bleeding); organ or nerve damage (i.e.: Any type of peripheral nerve, nerve root, or spinal cord injury) with subsequent damage to sensory, motor, and/or autonomic systems, resulting in permanent pain, numbness, and/or weakness of one or several areas of the body; allergic reactions; (i.e.: anaphylactic reaction); and/or death. Furthermore, the patient was informed of those risks and complications associated with the medications. These include, but are not limited to: allergic reactions (i.e.:  anaphylactic or anaphylactoid reaction(s)); adrenal axis suppression; blood sugar elevation that in diabetics may result in ketoacidosis or comma; water retention that in patients with history of congestive heart failure may result in shortness of breath, pulmonary edema, and decompensation with resultant heart failure; weight gain; swelling or edema; medication-induced neural toxicity; particulate matter embolism and blood vessel occlusion with resultant organ, and/or nervous system infarction; and/or aseptic necrosis of one or more joints. Finally, the patient was informed that Medicine is not an exact science; therefore, there is also the possibility of unforeseen or unpredictable risks and/or possible complications that may result in a catastrophic outcome. The patient indicated having understood very clearly. We have given the patient no guarantees and we have made no promises. Enough time was given to the patient to ask questions, all of which were answered to the patient's satisfaction. Ms. Naser has indicated that she wanted to continue with the procedure. Attestation: I, the ordering provider, attest that I have discussed with the patient the benefits, risks, side-effects, alternatives, likelihood of achieving goals, and potential problems during recovery for the procedure that I have provided informed consent. Date  Time: 09/22/2023  8:12 AM  Pre-Procedure Preparation:  Monitoring: As per clinic protocol. Respiration, ETCO2, SpO2, BP, heart rate and rhythm monitor placed and checked for adequate function Safety Precautions: Patient was assessed for positional comfort and pressure points before starting the procedure. Time-out: I initiated and conducted the Time-out before starting the procedure, as per protocol. The patient was asked to participate by confirming the accuracy of the Time Out information. Verification of the correct person, site, and procedure were performed and confirmed by me,  the nursing staff, and the patient. Time-out conducted as per Joint Commission's Universal Protocol (UP.01.01.01). Time: 0832 Start Time: 0832 hrs.  Description of Procedure:  Laterality: (see above) Targeted Levels: (see above)  Safety Precautions: Aspiration looking for blood return was conducted prior to all injections. At no point did we inject any substances, as a needle was being advanced. Before injecting, the patient was told to immediately notify me if she was experiencing any new onset of ringing in the ears, or metallic taste in the mouth. No attempts were made at seeking any paresthesias. Safe injection practices and needle disposal techniques used. Medications properly checked for expiration dates. SDV (single dose vial) medications used. After the completion of the procedure, all disposable equipment used was discarded in the proper designated medical waste containers. Local Anesthesia: Protocol guidelines were followed. The patient was positioned over the fluoroscopy table. The area was prepped in the usual manner. The time-out was completed. The target area was identified using fluoroscopy. A 12-in long, straight, sterile hemostat was used with fluoroscopic guidance to locate the targets for each level blocked. Once located, the skin was marked with an approved surgical skin marker. Once all sites were marked, the skin (epidermis, dermis, and hypodermis), as well as deeper tissues (fat, connective tissue and muscle) were infiltrated with a small amount of a short-acting local anesthetic, loaded on a 10cc syringe with a 25G, 1.5-in  Needle. An appropriate amount of time was allowed for local anesthetics to take effect before proceeding to the next step. Local Anesthetic: Lidocaine  2.0% The unused portion of the local anesthetic was discarded in the proper designated containers. Technical description of process:  Medial Branch  Dorsal Rami Nerve Block (MBB):  Neuroanatomy  note: Each lumbar facet joint receives dual innervation from medial branches arising from the posterior primary rami at the same level and one level above. The target for each lumbar medial branch is the junction of the ipsilateral superior articular and transverse process of the lower vertebral body. (i.e.: The L4-L5 facet joint is innervated by the L4 medial branch [located at L5] and the L3 medial branch [located at L4]. Blocking the L4 Medial Branch is therefore achieved by injecting at the junction of the ipsilateral superior articular and transverse process of the lower vertebral body [L5].).  Exception: The exception to the above rule is the L5-S1 facet joint which has triple innervation requiring the L4 medial branch, as well as the L5 and the S1 Dorsal Rami(s) to be blocked to fully denervate the joint.  Under fluoroscopic guidance, a needle was inserted until contact was made with os over the target area. After negative aspiration, 0.5 mL of the nerve block solution was injected without difficulty or complication. Paresthesia were avoided during injection. The needle(s) were removed intact and without complication.  Once the entire procedure was completed, the treated area was cleaned, making sure to leave some of the prepping solution back to take advantage of its long term bactericidal properties.         Illustration of the posterior view of the lumbar spine and the posterior neural structures. Laminae of L2 through S1 are labeled. DPRL5, dorsal primary ramus of L5; DPRS1, dorsal primary ramus of S1; DPR3, dorsal primary ramus of L3; FJ, facet (zygapophyseal) joint L3-L4; I, inferior articular process of L4; LB1, lateral branch of dorsal primary ramus of L1; IAB, inferior articular branches from L3 medial branch (supplies L4-L5 facet joint); IBP, intermediate branch plexus; MB3, medial branch of dorsal primary ramus of L3; NR3, third lumbar nerve root; S, superior articular process of L5;  SAB, superior articular branches from L4 (supplies L4-5 facet joint  also); TP3, transverse process of L3.   Facet Joint Innervation (* possible contribution)  L1-2 T12, L1 (L2*)  Medial Branch  L2-3 L1, L2 (L3*)                     L3-4 L2, L3 (L4*)                     L4-5 L3, L4 (L5*)                     L5-S1 L4, L5, S1                        Vitals:   09/22/23 0809 09/22/23 0834 09/22/23 0839 09/22/23 0841  BP: 130/76 137/83 131/86 122/84  Pulse: 83     Resp: 16 11 11 15   Temp: 98 F (36.7 C)     TempSrc: Temporal     SpO2: 97% 100% 100% 100%  Weight: 150 lb (68 kg)     Height: 4' 11 (1.499 m)        End Time: 0841 hrs.  Imaging Guidance (Spinal):         Type of Imaging Technique: Fluoroscopy Guidance (Spinal) Indication(s): Fluoroscopy guidance for needle placement to enhance accuracy in procedures requiring precise needle localization for targeted delivery of medication in or near specific anatomical locations not easily accessible without such real-time imaging assistance. Exposure Time: Please see nurses notes. Contrast: None used. Fluoroscopic Guidance: I was personally present during the use of fluoroscopy. Tunnel Vision Technique used to obtain the best possible view of the target area. Parallax error corrected before commencing the procedure. Direction-depth-direction technique used to introduce the needle under continuous pulsed fluoroscopy. Once target was reached, antero-posterior, oblique, and lateral fluoroscopic projection used confirm needle placement in all planes. Images permanently stored in EMR. Interpretation: No contrast injected. I personally interpreted the imaging intraoperatively. Adequate needle placement confirmed in multiple planes. Permanent images saved into the patient's record.  Post-operative Assessment:  Post-procedure Vital Signs:  Pulse/HCG Rate: 8383 Temp: 98 F (36.7 C) Resp: 15 BP: 122/84 SpO2: 100 %  EBL:  None  Complications: No immediate post-treatment complications observed by team, or reported by patient.  Note: The patient tolerated the entire procedure well. A repeat set of vitals were taken after the procedure and the patient was kept under observation following institutional policy, for this type of procedure. Post-procedural neurological assessment was performed, showing return to baseline, prior to discharge. The patient was provided with post-procedure discharge instructions, including a section on how to identify potential problems. Should any problems arise concerning this procedure, the patient was given instructions to immediately contact us , at any time, without hesitation. In any case, we plan to contact the patient by telephone for a follow-up status report regarding this interventional procedure.  Comments:  No additional relevant information.  Plan of Care (POC)  Orders:  Orders Placed This Encounter  Procedures   LUMBAR FACET(MEDIAL BRANCH NERVE BLOCK) MBNB    Scheduling Instructions:     Procedure: Lumbar facet block (AKA.: Lumbosacral medial branch nerve block)     Side: Bilateral     Level: L3-4, L4-5, and L5-S1 Facets (L2, L3, L4, L5, and S1 Medial Branch Nerves)     Sedation: Patient's choice.     Date: 09/22/2023    Where will this procedure be performed?:   [facility]   KNEE INJECTION  Indications: Knee arthralgia (pain) due to osteoarthritis (OA) Imaging: None (CPT-20610) Nursing: Please request pharmacy to have Monovisc (Hyaluronan) available in office.    Standing Status:   Future    Expected Date:   10/06/2023    Expiration Date:   12/22/2023    Scheduling Instructions:     Procedure: Knee injection Monovisc (hyaluronan 22mg /mL) (88mg /75mL)     Treatment No.: 1 of 1     Level: Intra-articular     Laterality: Right Knee     Sedation: Patient's choice.     Timeframe: in two (2) weeks     Total Hyaluronic acid (AKA hyaluronan or hyaluronate) per  injection: 88 mg (22.0 mg/mL x 4 mL). Series of 1 injection.    Where will this procedure be performed?:   [facility]   DG PAIN [facility] C-ARM 1-60 MIN NO REPORT    Intraoperative interpretation by procedural physician at [facility].    Standing Status:   Standing    Number of Occurrences:   1    Reason for exam::   Assistance in needle guidance and placement for procedures requiring needle placement in or near specific anatomical locations not easily accessible without such assistance.   Informed Consent Details: Physician/Practitioner Attestation; Transcribe to consent form and obtain patient signature    Nursing Order: Transcribe to consent form and obtain patient signature. Note: Always confirm laterality of pain with Ms. Chesley, before procedure.    Physician/Practitioner attestation of informed consent for procedure/surgical case:   I, the physician/practitioner, attest that I have discussed with the patient the benefits, risks, side effects, alternatives, likelihood of achieving goals and potential problems during recovery for the procedure that I have provided informed consent.    Procedure:   Lumbar Facet Block  under fluoroscopic guidance    Physician/Practitioner performing the procedure:   Azarion Hove A. Tanya MD    Indication/Reason:   Low Back Pain, with our without leg pain, due to Facet Joint Arthralgia (Joint Pain) Spondylosis (Arthritis of the Spine), without myelopathy or radiculopathy (Nerve Damage).   Provide equipment / supplies at bedside    Procedure tray: Block Tray (Disposable  single use) Skin infiltration needle: Regular 1.5-in, 25-G, (x1) Block Needle type: Spinal Amount/quantity: 4 Size: Regular (3.5-inch) Gauge: 22G    Standing Status:   Standing    Number of Occurrences:   1    Specify:   Block Tray     Analgesic: Tramadol  50 mg, 1 tab PO QD. ABNORMAL UDS (09/03/2020) (+) for COCAINE. MME: 5 mg/day    Medications ordered for  procedure: Meds ordered this encounter  Medications   iohexol  (OMNIPAQUE ) 180 MG/ML injection 10 mL    Must be Myelogram-compatible. If not available, you may substitute with a water-soluble, non-ionic, hypoallergenic, myelogram-compatible radiological contrast medium.   lidocaine  (XYLOCAINE ) 2 % (with pres) injection 400 mg   pentafluoroprop-tetrafluoroeth (GEBAUERS) aerosol   ropivacaine  (PF) 2 mg/mL (0.2%) (NAROPIN ) injection 18 mL   Medications administered: We administered lidocaine , pentafluoroprop-tetrafluoroeth, and ropivacaine  (PF) 2 mg/mL (0.2%).  See the medical record for exact dosing, route, and time of administration.    Interventional Therapies  Risk Factors  Considerations:   NOTE: UNCONTROLLED IDDM (NO STEROIDS)  ABNORMAL UDS (09/03/2020) (+) for COCAINE       Planned  Pending:  (NO STEROIDS)  Palliative bilateral lumbar facet MBB #R6L3 with possible RFA follow-up. (07/14/2023) today I have recommended to the patient to consider the use of a TENS unit.  (Information  provided)    Under consideration:   Possible bilateral lumbar facet RFA #1  Diagnostic bilateral genicular NB #1    Completed:   Diagnostic right lumbar facet MBB x5 (06/30/2023) (100/100/0) (no steroid) (Diagnostic - local anesthetics only.)  Diagnostic left lumbar facet MBB x2 (06/30/2023) (100/100/0) (no steroid) (Diagnostic - local anesthetics only.)  Therapeutic left L3-4 LESI x1 (02/11/2022) (100/100/75/100)  Therapeutic left L4-5 LESI x4 (09/21/2018) (100/100/50/70)  Therapeutic left L5 TFESI x4 (09/21/2018) (100/100/50/70)  Therapeutic left L5-S1 LESI x2 (02/25/2018) (100/100/100/100)  Therapeutic bilateral Hyalgan knee inj. x5 (02/25/2018) (100/100/100 x2 weeks/<50)  Therapeutic left IA steroid knee inj. x1 (11/23/2017) (100/100/0/0)  Therapeutic left IA Gelsyn-3 knee inj. x4 (06/30/2023) (100/100/50/50)  Therapeutic left IA Monovisc knee inj. x1 (03/18/2022) (100/100/50/50)    Therapeutic   Palliative (PRN) options:  (NO PRN PROCEDURES W/O MD F2F EVALUATION) Palliative L5 TFESI  Palliative L4-5 LESI   Palliative L5-S1 LESI   Palliative Hyalgan knee inj.      Follow-up plan:   Return in about 2 weeks (around 10/06/2023) for Eval-day (M,W), (Face2F), (PPE) + (NS) (R) IA Monovisc Knee inj. #1.     Recent Visits Date Type Provider Dept  08/17/23 Office Visit Tanya Glisson, MD [facility]  07/28/23 Procedure visit Tanya Glisson, MD [facility]  07/14/23 Procedure visit Tanya Glisson, MD [facility]  06/30/23 Procedure visit Tanya Glisson, MD [facility]  06/24/23 Office Visit Patel, Seema K, NP [facility]  Showing recent visits within past 90 days and meeting all other requirements Today's Visits Date Type Provider Dept  09/22/23 Procedure visit Tanya Glisson, MD [facility]  Showing today's visits and meeting all other requirements Future Appointments Date Type Provider Dept  09/24/23 Appointment Patel, Seema K, NP [facility]  Showing future appointments within next 90 days and meeting all other requirements   Disposition: Discharge home  Discharge (Date  Time): 09/22/2023; 0850 hrs.   Primary Care Physician: Sadie Manna, MD Location: [facility] Note by: Glisson DELENA Tanya, MD (TTS technology used. I apologize for any typographical errors that were not detected and corrected.) Date: 09/22/2023; Time: 8:48 AM  Disclaimer:  Medicine is not an Visual merchandiser. The only guarantee in medicine is that nothing is guaranteed. It is important to note that the decision to proceed with this intervention was based on the information collected from the patient. The Data and conclusions were drawn from the patient's questionnaire, the interview, and the physical examination. Because the information was provided in large part by the patient, it cannot be  guaranteed that it has not been purposely or unconsciously manipulated. Every effort has been made to obtain as much relevant data as possible for this evaluation. It is important to note that the conclusions that lead to this procedure are derived in large part from the available data. Always take into account that the treatment will also be dependent on availability of resources and existing treatment guidelines, considered by other Pain Management Practitioners as being common knowledge and practice, at the time of the intervention. For Medico-Legal purposes, it is also important to point out that variation in procedural techniques and pharmacological choices are the acceptable norm. The indications, contraindications, technique, and results of the above procedure should only be interpreted and judged by a Board-Certified Interventional Pain Specialist with extensive familiarity and expertise in the same exact procedure and technique.

## 2023-09-22 NOTE — Patient Instructions (Signed)

## 2023-09-22 NOTE — Progress Notes (Signed)
 Nursing Pain Medication Assessment:  Safety precautions to be maintained throughout the outpatient stay will include: orient to surroundings, keep bed in low position, maintain call bell within reach at all times, provide assistance with transfer out of bed and ambulation.  Medication Inspection Compliance: Pill count conducted under aseptic conditions, in front of the patient. Neither the pills nor the bottle was removed from the patient's sight at any time. Once count was completed pills were immediately returned to the patient in their original bottle.  Medication: Tramadol  (Ultram ) Pill/Patch Count: 05 of 30 pills/patches remain Pill/Patch Appearance: Markings consistent with prescribed medication Bottle Appearance: Standard pharmacy container. Clearly labeled. Filled Date: 07 / 07 / 2025 Last Medication intake:  Day before yesterday

## 2023-09-23 ENCOUNTER — Telehealth: Payer: Self-pay | Admitting: *Deleted

## 2023-09-23 NOTE — Telephone Encounter (Signed)
 Post procedure call; voicemail left

## 2023-09-24 ENCOUNTER — Encounter: Admitting: Nurse Practitioner

## 2023-10-07 ENCOUNTER — Ambulatory Visit: Attending: Pain Medicine | Admitting: Pain Medicine

## 2023-10-07 ENCOUNTER — Encounter: Payer: Self-pay | Admitting: Pain Medicine

## 2023-10-07 VITALS — BP 149/79 | HR 82 | Temp 97.7°F | Ht 59.0 in | Wt 150.0 lb

## 2023-10-07 DIAGNOSIS — M47816 Spondylosis without myelopathy or radiculopathy, lumbar region: Secondary | ICD-10-CM | POA: Diagnosis present

## 2023-10-07 DIAGNOSIS — M25561 Pain in right knee: Secondary | ICD-10-CM | POA: Diagnosis present

## 2023-10-07 DIAGNOSIS — M4310 Spondylolisthesis, site unspecified: Secondary | ICD-10-CM | POA: Insufficient documentation

## 2023-10-07 DIAGNOSIS — M545 Low back pain, unspecified: Secondary | ICD-10-CM | POA: Insufficient documentation

## 2023-10-07 DIAGNOSIS — G8929 Other chronic pain: Secondary | ICD-10-CM | POA: Insufficient documentation

## 2023-10-07 DIAGNOSIS — G8918 Other acute postprocedural pain: Secondary | ICD-10-CM | POA: Insufficient documentation

## 2023-10-07 DIAGNOSIS — M1711 Unilateral primary osteoarthritis, right knee: Secondary | ICD-10-CM | POA: Diagnosis present

## 2023-10-07 DIAGNOSIS — M5459 Other low back pain: Secondary | ICD-10-CM | POA: Insufficient documentation

## 2023-10-07 DIAGNOSIS — M4316 Spondylolisthesis, lumbar region: Secondary | ICD-10-CM | POA: Diagnosis present

## 2023-10-07 NOTE — Patient Instructions (Signed)

## 2023-10-07 NOTE — Progress Notes (Addendum)
 PROVIDER NOTE: Interpretation of information contained herein should be left to medically-trained personnel. Specific patient instructions are provided elsewhere under Patient Instructions section of medical record. This document was created in part using AI and STT-dictation technology, any transcriptional errors that may result from this process are unintentional.  Patient: Charlene [name]  Service: E/M   PCP: Sadie Manna, MD  DOB: [birth year]  DOS: 10/07/2023  Provider: Eric DELENA Como, MD  MRN: [id number]  Delivery: Face-to-face  Specialty: Interventional Pain Management  Type: Established Patient  Setting: Ambulatory outpatient facility  Specialty designation: 09  Referring Prov.: Sadie Manna, MD  Location: Outpatient office facility       History of present illness (HPI) Charlene [name], a 63 y.o. year old female, is here today because of her Chronic pain of right knee [M25.561, G89.29]. Charlene [name] primary complain today is Knee Pain (Right inner knee)  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HCC); Chronic ankle pain (Left); Chronic foot pain (Left); Chronic lower extremity pain (2ry area of Pain) (Bilateral) (L>R); Chronic knee pain (4th area of Pain) (Bilateral) (L>R); Chronic low back pain (3ry area of Pain) (Bilateral) (L>R) w/ sciatica (Left); Chronic pain syndrome; DM type 2 with diabetic peripheral neuropathy (HCC); DDD (degenerative disc disease), lumbar; Lumbar facet arthropathy; Lumbar facet syndrome; Lumbar facet hypertrophy; Lumbar foraminal stenosis (L5-S1) (Right); Osteoarthritis of knee (Bilateral); Chronic musculoskeletal pain; Abnormal EMG (07/06/2017); Osteoarthritis involving multiple joints; 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); Knee pain; Back pain with left-sided sciatica; Meningioma  (HCC); Neck pain; Chronic pain disorder; Left leg weakness; Meralgia paraesthetica, left; Pain in both lower extremities; Arthropathy of lumbar facet joint; Chronic ankle and foot pain (1ry area of Pain) (Left); 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); 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); Spondylosis without myelopathy or radiculopathy, lumbosacral region; Chronic low back pain (Bilateral) w/o sciatica; Chronic sacroiliac joint pain (Right); Abnormal x-ray of lumbar spine (03/16/2021); Closed compression fracture of L3 lumbar vertebra, sequela; History of compression fracture of vertebral column; Spinal column pain; Chronic knee pain (Left); Osteoarthritis of knee (Left); Pain of patellofemoral joint (Left); Grade 1 Retrolisthesis of L2/L3; Lumbar facet joint pain; Chronic knee pain (Right); Low back pain of over 3 months duration; Intermittent low back pain; Mechanical low back pain; Multifactorial low back pain; and Osteoarthritis of knee (Right) on their pertinent problem list.  Pain Assessment: Severity of Acute pain is reported as a 3 /10. Location: Knee Right (inner right knee)/denies. Onset: In the past 7 days. Quality: Sharp. Timing: Constant. Modifying factor(s): denies. Vitals:  height is 4' 11 (1.499 m) and weight is 150 lb (68 kg). Her temperature is 97.7 F (36.5 C). Her blood pressure is 149/79 (abnormal) and her pulse is 82. Her oxygen saturation is 98%.  BMI: Estimated body mass index is 30.3 kg/m as calculated from the following:   Height as of this encounter: 4' 11 (1.499 m).   Weight as of this encounter: 150 lb (68 kg).  Last encounter: 08/17/2023. Last procedure: 09/22/2023.  Reason for encounter: post-procedure evaluation and assessment.   Discussed the use of AI scribe software for clinical note transcription with the patient, who gave verbal consent to proceed.  History of Present Illness   RUE VALLADARES is a 63 year old female who presents with right  knee pain.  She experiences severe pain in her right knee,  significantly impairing her ability to walk. Previously, she received a bilateral lumbar facet medial branch block without steroids, which provided complete relief from her back pain during the local anesthetic's effect.      Post-Procedure Evaluation   Type: Lumbar Facet, Medial Branch Block(s)   R6L3 (w/o steroids) Laterality: Bilateral  Level: L2, L3, L4, L5, and S1 Medial Branch/Dorsal Rami Level(s). Injecting these levels blocks the L3-4, L4-5, and L5-S1 lumbar facet joints. Imaging: Fluoroscopic guidance Spinal ([id number]) Anesthesia: Local anesthesia (1-2% Lidocaine ) Anxiolysis: None no driver.         Sedation: No Sedation                       DOS: 09/22/2023 Performed by: Eric DELENA Como, MD  Primary Purpose: Diagnostic/Therapeutic Indications: Low back pain severe enough to impact quality of life or function. 1. Chronic low back pain (Bilateral) w/o sciatica   2. 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 Grade 1 Retrolisthesis of L2/L3   4. Low back pain of over 3 months duration   5. Lumbar facet joint pain   6. Lumbar facet arthropathy   7. Lumbar facet hypertrophy   8. Lumbar facet syndrome   9. Spondylosis without myelopathy or radiculopathy, lumbosacral region    NAS-11 Pain score:   Pre-procedure: 1 /10   Post-procedure: 0-No pain/10   Note: The patient indicates having an increase right knee pain.  Would like to have the right knee treated.  Because of her uncontrolled diabetes we will be doing so with gel.    Effectiveness:  Initial hour after procedure: 100 %. Subsequent 4-6 hours post-procedure: 100 %. Analgesia past initial 6 hours: 100 %. Ongoing improvement:  Analgesic: The patient indicates having attained 100% relief of the pain for the duration of the local anesthetic which actually has persisted and she has an ongoing 100% relief of the low back pain with resolution of  symptoms. Function: Charlene [name] reports improvement in function ROM: Charlene [name] reports improvement in ROM  Pharmacotherapy Assessment   Analgesic: Tramadol  50 mg, 1 tab PO QD. ABNORMAL UDS (09/03/2020) (+) for COCAINE. MME: 5 mg/day   Monitoring: [place] PMP: PDMP reviewed during this encounter.       Pharmacotherapy: No side-effects or adverse reactions reported. Compliance: No problems identified. Effectiveness: Clinically acceptable.  Charlene Nathanel PARAS, Charlene [name]  10/07/2023  1:49 PM  Signed Safety precautions to be maintained throughout the outpatient stay will include: orient to surroundings, keep bed in low position, maintain call bell within reach at all times, provide assistance with transfer out of bed and ambulation.     UDS:  Summary  Date Value Ref Range Status  05/11/2023 FINAL  Final    Comment:    ==================================================================== [employer] Select 13 (MW) ==================================================================== Test                             Result       Flag       Units  Drug Absent but Declared for Prescription Verification   Tramadol                        Not Detected UNEXPECTED ng/mg creat ==================================================================== Test                      Result    Flag   Units  Ref Range   Creatinine              21               mg/dL      >=79 ==================================================================== Declared Medications:  The flagging and interpretation on this report are based on the  following declared medications.  Unexpected results may arise from  inaccuracies in the declared medications.   **Note: The testing scope of this panel includes these medications:   Tramadol  (Ultram )   **Note: The testing scope of this panel does not include the  following reported medications:   Albuterol (Ventolin HFA)  Alendronate (Fosamax)  Amlodipine  (Norvasc )  Baclofen  (Lioresal )   Carvedilol  (Coreg )  Dicyclomine  (Bentyl )  Duloxetine  (Cymbalta )  Empagliflozin (Jardiance)  Glucagon  Hydralazine (Apresoline)  Insulin  (NovoLog )  Losartan (Cozaar)  Magnesium   Meloxicam  (Mobic )  Metformin (Glucophage)  Nortriptyline (Pamelor)  Ondansetron  (Zofran )  Pantoprazole  (Protonix )  Polyethylene Glycol (MiraLAX )  Pravastatin (Pravachol)  Pregabalin (Lyrica)  Sucralfate (Carafate)  Vitamin D  ==================================================================== For clinical consultation, please call [phone]. ====================================================================     No results found for: CBDTHCR No results found for: D8THCCBX No results found for: D9THCCBX  ROS  Constitutional: Denies any fever or chills Gastrointestinal: No reported hemesis, hematochezia, vomiting, or acute GI distress Musculoskeletal: Denies any acute onset joint swelling, redness, loss of ROM, or weakness Neurological: No reported episodes of acute onset apraxia, aphasia, dysarthria, agnosia, amnesia, paralysis, loss of coordination, or loss of consciousness  Medication Review  Cholecalciferol , DULoxetine , FreeStyle Libre Sensor System, Glucagon HCl (Diagnostic), Insulin  Aspart FlexPen, Magnesium , albuterol, alendronate, amLODipine , baclofen , carvedilol , dicyclomine , empagliflozin, hydrALAZINE, insulin  glargine (2 Unit Dial), insulin  lispro, losartan, meloxicam , metFORMIN, nortriptyline, ondansetron , pantoprazole , polyethylene glycol, pravastatin, pregabalin, sucralfate, and traMADol   History Review  Allergy: Charlene [name] is allergic to semaglutide. Drug: Charlene [name]  reports no history of drug use. Alcohol:  reports that she does not currently use alcohol. Tobacco:  reports that she has never smoked. She has never used smokeless tobacco. Social: Charlene [name]  reports that she has never smoked. She has never used smokeless tobacco. She reports that she does not currently use  alcohol. She reports that she does not use drugs. Medical:  has a past medical history of Arthritis, Degenerative disc disease, lumbar, Diabetes mellitus without complication (HCC), GERD (gastroesophageal reflux disease), Hypertension, Neuropathy, Polyneuropathy, and Polyneuropathy. Surgical: Charlene [name]  has a past surgical history that includes arthroscopic rotator cuff; Colonoscopy; Abdominal hysterectomy; endosopic sinus; Cataract extraction w/PHACO (Right, 11/25/2017); and Eye surgery. Family: family history includes Alcohol abuse in her father; Breast cancer in her maternal aunt; Cancer in her father; Pancreatic cancer in her maternal aunt.  Laboratory Chemistry Profile   Renal Lab Results  Component Value Date   BUN 38 (H) 01/18/2023   CREATININE 2.10 (H) 01/18/2023   BCR 17 10/12/2017   GFRAA >60 12/23/2018   GFRNONAA 26 (L) 01/18/2023    Hepatic Lab Results  Component Value Date   AST 29 01/18/2023   ALT 21 01/18/2023   ALBUMIN 3.9 01/18/2023   ALKPHOS 94 01/18/2023   LIPASE 27 01/18/2023    Electrolytes Lab Results  Component Value Date   NA 136 01/18/2023   K 4.6 01/18/2023   CL 100 01/18/2023   CALCIUM  9.1 01/18/2023   MG 2.0 10/12/2017    Bone Lab Results  Component Value Date   25OHVITD1 17 (L) 10/12/2017   25OHVITD2 <1.0 10/12/2017   25OHVITD3 17 10/12/2017    Inflammation (CRP:  Acute Phase) (ESR: Chronic Phase) Lab Results  Component Value Date   CRP 4 10/12/2017   ESRSEDRATE 12 10/12/2017         Note: Above Lab results reviewed.  Recent Imaging Review  DG PAIN CLINIC C-ARM 1-60 MIN NO REPORT Fluoro was used, but no Radiologist interpretation will be provided.  Please refer to NOTES tab for provider progress note. Note: Reviewed        Physical Exam  Vitals: BP (!) 149/79   Pulse 82   Temp 97.7 F (36.5 C)   Ht 4' 11 (1.499 m)   Wt 150 lb (68 kg)   SpO2 98%   BMI 30.30 kg/m  BMI: Estimated body mass index is 30.3 kg/m as calculated  from the following:   Height as of this encounter: 4' 11 (1.499 m).   Weight as of this encounter: 150 lb (68 kg). Ideal: Patient must be at least 60 in tall to calculate ideal body weight General appearance: Well nourished, well developed, and well hydrated. In no apparent acute distress Mental status: Alert, oriented x 3 (person, place, & time)       Respiratory: No evidence of acute respiratory distress Eyes: PERLA   Assessment   Diagnosis Status  1. Chronic knee pain (Right)   2. Osteoarthritis of knee (Right)   3. Chronic low back pain (Bilateral) w/o sciatica   4. Low back pain of over 3 months duration   5. Lumbar facet joint pain   6. 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 Grade 1 Retrolisthesis of L2/L3   8. Lumbar facet arthropathy   9. Acute postoperative pain    Persistent Deteriorating Resolved   Updated Problems: No problems updated.  Plan of Care  Problem-specific:  Assessment and Plan    Right knee pain   Persistent right knee pain severely impairs ambulation, requiring immediate intervention. Reschedule right knee injection with Monodox  as soon as possible.  Lumbar back pain   Previously treated with bilateral lumbar facet medial branch block without steroids, achieving 100% relief during the local anesthetic's duration. Currently, no back pain is reported.       Charlene [name] has a current medication list which includes the following long-term medication(s): albuterol, amlodipine , baclofen , carvedilol , cholecalciferol , dicyclomine , duloxetine , hydralazine, toujeo  max solostar, insulin  lispro, losartan, magnesium , nortriptyline, pantoprazole , pravastatin, pregabalin, sucralfate, and tramadol .  Pharmacotherapy (Medications Ordered): No orders of the defined types were placed in this encounter.  Orders:  Orders Placed This Encounter  Procedures   KNEE INJECTION    Indications: Knee arthralgia (pain) due to osteoarthritis  (OA) Imaging: None (CPT-20610) Nursing: Please request pharmacy to have Monovisc (Hyaluronan) available in office.    Standing Status:   Future    Expected Date:   10/21/2023    Expiration Date:   01/06/2024    Scheduling Instructions:     Procedure: Knee injection Monovisc (hyaluronan 22mg /mL) (88mg /37mL)     Treatment No.: 1 of 1     Level: Intra-articular     Laterality: Right Knee     Sedation: Patient's choice.     Timeframe: As soon as schedule allows     Total Hyaluronic acid (AKA hyaluronan or hyaluronate) per injection: 88 mg (22.0 mg/mL x 4 mL). Series of 1 injection.    Where will this procedure be performed?:   [facility]     Interventional Therapies  Risk Factors  Considerations:   NOTE: UNCONTROLLED IDDM (NO STEROIDS)  ABNORMAL UDS (09/03/2020) (+) for COCAINE       Planned  Pending:  (NO STEROIDS)  Therapeutic right IA Monovisc knee injection #1  (07/14/2023) I have recommended to the patient to consider the use of a TENS unit.  (Information provided)    Under consideration:   Possible bilateral lumbar facet RFA #1  Diagnostic bilateral genicular NB #1    Completed:   Diagnostic right lumbar facet MBB x6 (09/22/2023) (100/100/100/100) (no steroid) (Diagnostic - local anesthetics only.)  Diagnostic left lumbar facet MBB x3 (09/22/2023) (100/100/100/100) (no steroid) (Diagnostic - local anesthetics only.)  Therapeutic left L3-4 LESI x1 (02/11/2022) (100/100/75/100)  Therapeutic left L4-5 LESI x4 (09/21/2018) (100/100/50/70)  Therapeutic left L5 TFESI x4 (09/21/2018) (100/100/50/70)  Therapeutic left L5-S1 LESI x2 (02/25/2018) (100/100/100/100)  Therapeutic bilateral Hyalgan knee inj. x5 (02/25/2018) (100/100/100 x2 weeks/<50)  Therapeutic left IA steroid knee inj. x1 (11/23/2017) (100/100/0/0)  Therapeutic left IA Gelsyn-3 knee inj. x4 (06/30/2023) (100/100/50/50)  Therapeutic left IA Monovisc knee inj. x1 (03/18/2022) (100/100/50/50)    Therapeutic   Palliative (PRN) options:  (NO PRN PROCEDURES W/O MD F2F EVALUATION) Palliative L5 TFESI  Palliative L4-5 LESI   Palliative L5-S1 LESI   Palliative Hyalgan knee inj.       Return for [facility]): (NS) (R) IA Monovisc Knee inj. #1.    Recent Visits Date Type Provider Dept  09/22/23 Procedure visit Tanya Glisson, MD [facility]  08/17/23 Office Visit Tanya Glisson, MD [facility]  07/28/23 Procedure visit Tanya Glisson, MD [facility]  07/14/23 Procedure visit Tanya Glisson, MD [facility]  Showing recent visits within past 90 days and meeting all other requirements Today's Visits Date Type Provider Dept  10/07/23 Office Visit Tanya Glisson, MD [facility]  Showing today's visits and meeting all other requirements Future Appointments Date Type Provider Dept  10/20/23 Appointment Tanya Glisson, MD [facility]  12/21/23 Appointment Patel, Seema K, NP [facility]  Showing future appointments within next 90 days and meeting all other requirements  I discussed the assessment and treatment plan with the patient. The patient was provided an opportunity to ask questions and all were answered. The patient agreed with the plan and demonstrated an understanding of the instructions.  Patient advised to call back or seek an in-person evaluation if the symptoms or condition worsens.  Duration of encounter: 30 minutes.  Total time on encounter, as per AMA guidelines included both the face-to-face and non-face-to-face time personally spent by the physician and/or other qualified health care professional(s) on the day of the encounter (includes time in activities that require the physician or other qualified health care professional and does not include time in activities normally performed by clinical staff). Physician's time may include the following activities when performed: Preparing to see the  patient (e.g., pre-charting review of records, searching for previously ordered imaging, lab work, and nerve conduction tests) Review of prior analgesic pharmacotherapies. Reviewing PMP Interpreting ordered tests (e.g., lab work, imaging, nerve conduction tests) Performing post-procedure evaluations, including interpretation of diagnostic procedures Obtaining and/or reviewing separately obtained history Performing a medically appropriate examination and/or evaluation Counseling and educating the patient/family/caregiver Ordering medications, tests, or procedures Referring and communicating with other health care professionals (when not separately reported) Documenting clinical information in the electronic or other health record Independently interpreting results (not separately reported) and communicating results to the patient/ family/caregiver Care coordination (not separately reported)  Note by: Glisson DELENA Tanya, MD (TTS and AI technology used.  I apologize for any typographical errors that were not detected and corrected.) Date: 10/07/2023; Time: 2:19 PM

## 2023-10-07 NOTE — Progress Notes (Signed)
 Safety precautions to be maintained throughout the outpatient stay will include: orient to surroundings, keep bed in low position, maintain call bell within reach at all times, provide assistance with transfer out of bed and ambulation.

## 2023-10-13 ENCOUNTER — Ambulatory Visit: Admitting: Pain Medicine

## 2023-10-20 ENCOUNTER — Ambulatory Visit: Attending: Pain Medicine | Admitting: Pain Medicine

## 2023-10-20 ENCOUNTER — Telehealth: Payer: Self-pay

## 2023-10-20 VITALS — BP 148/83 | HR 84 | Temp 97.3°F | Resp 17

## 2023-10-20 DIAGNOSIS — M79604 Pain in right leg: Secondary | ICD-10-CM | POA: Insufficient documentation

## 2023-10-20 DIAGNOSIS — M25561 Pain in right knee: Secondary | ICD-10-CM | POA: Diagnosis present

## 2023-10-20 DIAGNOSIS — M1711 Unilateral primary osteoarthritis, right knee: Secondary | ICD-10-CM | POA: Diagnosis present

## 2023-10-20 DIAGNOSIS — G894 Chronic pain syndrome: Secondary | ICD-10-CM | POA: Diagnosis present

## 2023-10-20 DIAGNOSIS — G8929 Other chronic pain: Secondary | ICD-10-CM | POA: Insufficient documentation

## 2023-10-20 DIAGNOSIS — M79605 Pain in left leg: Secondary | ICD-10-CM | POA: Diagnosis present

## 2023-10-20 MED ORDER — ROPIVACAINE HCL 2 MG/ML IJ SOLN
3.0000 mL | Freq: Once | INTRAMUSCULAR | Status: AC
  Administered 2023-10-20: 3 mL via INTRA_ARTICULAR
  Filled 2023-10-20: qty 20

## 2023-10-20 MED ORDER — LIDOCAINE HCL (PF) 2 % IJ SOLN
5.0000 mL | Freq: Once | INTRAMUSCULAR | Status: AC
  Administered 2023-10-20: 5 mL
  Filled 2023-10-20: qty 5

## 2023-10-20 MED ORDER — HYALURONAN 88 MG/4ML IX SOSY
88.0000 mg | PREFILLED_SYRINGE | Freq: Once | INTRA_ARTICULAR | Status: AC
  Administered 2023-10-20: 88 mg via INTRA_ARTICULAR

## 2023-10-20 MED ORDER — PENTAFLUOROPROP-TETRAFLUOROETH EX AERO
INHALATION_SPRAY | Freq: Once | CUTANEOUS | Status: AC
  Administered 2023-10-20: 30 via TOPICAL

## 2023-10-20 NOTE — Patient Instructions (Signed)
 ______________________________________________________________________    Post-Procedure Discharge Instructions  INSTRUCTIONS Apply ice:  Purpose: This will minimize any swelling and discomfort after procedure.  When: Day of procedure, as soon as you get home. How: Fill a plastic sandwich bag with crushed ice. Cover it with a small towel and apply to injection site. How long: (15 min on, 15 min off) Apply for 15 minutes then remove x 15 minutes.  Repeat sequence on day of procedure, until you go to bed. Apply heat:  Purpose: To treat any soreness and discomfort from the procedure. When: Starting the next day after the procedure. How: Apply heat to procedure site starting the day following the procedure. How long: May continue to repeat daily, until discomfort goes away. Food intake: Start with clear liquids (like water) and advance to regular food, as tolerated.  Physical activities: Keep activities to a minimum for the first 8 hours after the procedure. After that, then as tolerated. Driving: If you have received any sedation, be responsible and do not drive. You are not allowed to drive for 24 hours after having sedation. Blood thinner: (Applies only to those taking blood thinners) You may restart your blood thinner 6 hours after your procedure. Insulin : (Applies only to Diabetic patients taking insulin ) As soon as you can eat, you may resume your normal dosing schedule. Infection prevention: Keep procedure site clean and dry. Shower daily and clean area with soap and water.  PAIN DIARY Post-procedure Pain Diary: Extremely important that this be done correctly and accurately. Recorded information will be used to determine the next step in treatment. For the purpose of accuracy, follow these rules: Evaluate only the area treated. Do not report or include pain from an untreated area. For the purpose of this evaluation, ignore all other areas of pain, except for the treated area. After your  procedure, avoid taking a long nap and attempting to complete the pain diary after you wake up. Instead, set your alarm clock to go off every hour, on the hour, for the initial 8 hours after the procedure. Document the duration of the numbing medicine, and the relief you are getting from it. Do not go to sleep and attempt to complete it later. It will not be accurate. If you received sedation, it is likely that you were given a medication that may cause amnesia. Because of this, completing the diary at a later time may cause the information to be inaccurate. This information is needed to plan your care. Follow-up appointment: Keep your post-procedure follow-up evaluation appointment after the procedure (usually 2 weeks for most procedures, 6 weeks for radiofrequencies). DO NOT FORGET to bring you pain diary with you.   EXPECT... (What should I expect to see with my procedure?) From numbing medicine (AKA: Local Anesthetics): Numbness or decrease in pain. You may also experience some weakness, which if present, could last for the duration of the local anesthetic. Onset: Full effect within 15 minutes of injected. Duration: It will depend on the type of local anesthetic used. On the average, 1 to 8 hours.  From steroids (None used) From procedure: Some discomfort is to be expected once the numbing medicine wears off. This should be minimal if ice and heat are applied as instructed.  CALL IF... (When should I call?) You experience numbness and weakness that gets worse with time, as opposed to wearing off. New onset bowel or bladder incontinence. (Applies only to procedures done in the spine)  Emergency Numbers: Durning business hours (Monday - Thursday,  8:00 AM - 4:00 PM) (Friday, 9:00 AM - 12:00 Noon): [phone] After hours: [phone] NOTE: If you are having a problem and are unable connect with, or to talk to a provider, then go to your nearest urgent care or emergency department. If the  problem is serious and urgent, please call 911. ______________________________________________________________________

## 2023-10-20 NOTE — Telephone Encounter (Signed)
Attempted to call patient. Mailbox was full. 

## 2023-10-20 NOTE — Progress Notes (Signed)
 PROVIDER NOTE: Interpretation of information contained herein should be left to medically-trained personnel. Specific patient instructions are provided elsewhere under Patient Instructions section of medical record. This document was created in part using STT-dictation technology, any transcriptional errors that may result from this process are unintentional.  Patient: Charlene [name] Type: Established DOB: Mar 23, 1960 MRN: [id number] PCP: Sadie Manna, MD  Service: Procedure DOS: 10/20/2023 Setting: Ambulatory Location: Ambulatory outpatient facility Delivery: Face-to-face Provider: Eric DELENA Como, MD Specialty: Interventional Pain Management Specialty designation: 09 Location: Outpatient facility Ref. Prov.: Sadie Manna, MD       Interventional Therapy   Type:  Monovisc Intra-articular Knee Injection #1 (5 cc of fluid removed from the right knee before injection) Laterality: Right (-RT) Level/approach: Lateral Imaging guidance: None required ([id number]) Anesthesia: Local anesthesia (1-2% Lidocaine ) Anxiolysis: None                 Sedation: No Sedation                       DOS: 10/20/2023  Performed by: Eric DELENA Como, MD  Purpose: Diagnostic/Therapeutic Indications: Knee arthralgia associated to osteoarthritis of the knee 1. Chronic knee pain (Right)   2. Osteoarthritis of knee (Right)   3. Pain in both lower extremities   4. Chronic pain syndrome    NAS-11 score:   Pre-procedure:  /10   Post-procedure:  /10     Pre-Procedure Preparation  Monitoring: As per clinic protocol.  Risk Assessment: Vitals:  AFP:Zdupfjuzi body mass index is 30.3 kg/m as calculated from the following:   Height as of 10/07/23: 4' 11 (1.499 m).   Weight as of 10/07/23: 150 lb (68 kg)., Rate:84 , BP:(!) 148/83, Resp:17, Temp:(!) 97.3 F (36.3 C), SpO2:100 %  Allergies: She is allergic to semaglutide.  Precautions: No additional precautions required  Blood-thinner(s): None at  this time  Coagulopathies: Reviewed. None identified.   Active Infection(s): Reviewed. None identified. Charlene [name] is afebrile   Location setting: Exam room Position: Sitting w/ knee bent 90 degrees Safety Precautions: Patient was assessed for positional comfort and pressure points before starting the procedure. Prepping solution: DuraPrep (Iodine Povacrylex [0.7% available iodine] and Isopropyl Alcohol, 74% w/w) Prep Area: Entire knee region Approach: percutaneous, just above the tibial plateau, lateral to the infrapatellar tendon. Intended target: Intra-articular knee space Materials: Tray: Block Needle(s): Regular Qty: 1/side Length: 1.5-inch Gauge: 25G (x1) + 22G (x1)  Meds ordered this encounter  Medications   pentafluoroprop-tetrafluoroeth (GEBAUERS) aerosol   lidocaine  HCl (PF) (XYLOCAINE ) 2 % injection 5 mL   ropivacaine  (PF) 2 mg/mL (0.2%) (NAROPIN ) injection 3 mL   Hyaluronan (MONOVISC) intra-articular injection 88 mg    Do not substitute for formulary alternative without first contacting physician for approval. Deliver to facility day before procedure.    Orders Placed This Encounter  Procedures   KNEE INJECTION    Indications: Knee arthralgia (pain) due to osteoarthritis (OA) Imaging: None (CPT-20610) Position: Sitting Equipment/Materials: Block tray  1.5, 25-G (one per side)  Local anesthetic  Monovisc (one per side) Confirm availability (in office) of Monovisc (HMW hyaluronan)    Scheduling Instructions:     Procedure: Knee injection Monovisc (Hyaluronan/Hyaluronic acid)     Treatment No.: 1     Level: Intra-articular     Laterality: Right Knee     Sedation: Patient's choice.     Date: 10/20/2023    Where will this procedure be performed?:   [facility]   Informed Consent  Details: Physician/Practitioner Attestation; Transcribe to consent form and obtain patient signature    Nursing Order: Transcribe to consent form and obtain patient  signature. Note: Always confirm laterality of pain with Charlene [name], before procedure.    Physician/Practitioner attestation of informed consent for procedure/surgical case:   I, the physician/practitioner, attest that I have discussed with the patient the benefits, risks, side effects, alternatives, likelihood of achieving goals and potential problems during recovery for the procedure that I have provided informed consent.    Procedure:   Therapeutic intra-articular viscosupplementation knee injection    Physician/Practitioner performing the procedure:   Marylynne Keelin A. Tanya, MD    Indication/Reason:   Chronic knee pain secondary to primary osteoarthritis of the knee  (Right-M17.11), (Left-M17.12) or (Bilateral-M17.0)   Provide equipment / supplies at bedside    Procedure tray: Block Tray (Disposable  single use) Skin infiltration needle: Regular 1.5-in, 25-G, (x1) Block Needle type: Regular Amount/quantity: 1 Size: Short(1.5-inch) Gauge: (25G x1) + (22G x1)    Standing Status:   Standing    Number of Occurrences:   1    Specify:   Block Tray     Time-out: 1359 I initiated and conducted the Time-out before starting the procedure, as per protocol. The patient was asked to participate by confirming the accuracy of the Time Out information. Verification of the correct person, site, and procedure were performed and confirmed by me, the nursing staff, and the patient. Time-out conducted as per Joint Commission's Universal Protocol (UP.01.01.01). Procedure checklist: Completed  H&P (Pre-op  Assessment)  Charlene [name] is a 63 y.o. (year old), female patient, seen today for interventional treatment. She  has a past surgical history that includes arthroscopic rotator cuff; Colonoscopy; Abdominal hysterectomy; endosopic sinus; Cataract extraction w/PHACO (Right, 11/25/2017); and Eye surgery. Charlene [name] has a current medication list which includes the following prescription(s): albuterol,  alendronate, amlodipine , baclofen , carvedilol , cholecalciferol , freestyle libre sensor system, dicyclomine , duloxetine , empagliflozin, glucagon hcl (diagnostic), hydralazine, insulin  aspart flexpen, toujeo  max solostar, insulin  lispro, losartan, magnesium , meloxicam , metformin, nortriptyline, ondansetron , pantoprazole , polyethylene glycol, pravastatin, pregabalin, sucralfate, and tramadol . Her primarily concern today is the Knee Pain (right)  She is allergic to semaglutide.   Last encounter: My last encounter with her was on 10/07/2023. Pertinent problems: Charlene [name] has Burning sensation of feet; Neuropathic pain; Neuropathy; Lower extremity numbness and tingling (Left); Polyneuropathy associated with underlying disease; Chronic ankle pain (Left); Chronic foot pain (Left); Chronic lower extremity pain (2ry area of Pain) (Bilateral) (L>R); Chronic knee pain (4th area of Pain) (Bilateral) (L>R); Chronic low back pain (3ry area of Pain) (Bilateral) (L>R) w/ sciatica (Left); Chronic pain syndrome; DM type 2 with diabetic peripheral neuropathy (HCC); DDD (degenerative disc disease), lumbar; Lumbar facet arthropathy; Lumbar facet syndrome; Lumbar facet hypertrophy; Lumbar foraminal stenosis (L5-S1) (Right); Osteoarthritis of knee (Bilateral); Chronic musculoskeletal pain; Abnormal EMG (07/06/2017); Osteoarthritis involving multiple joints; 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); Knee pain; Back pain with left-sided sciatica; Meningioma (HCC); Neck pain; Chronic pain disorder; Left leg weakness; Meralgia paraesthetica, left; Pain in both lower extremities; Arthropathy of lumbar facet joint; Chronic ankle and foot pain (1ry area of Pain) (Left); 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); 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); Spondylosis without myelopathy or radiculopathy, lumbosacral region; Chronic low back pain (Bilateral) w/o sciatica; Chronic sacroiliac joint pain (Right);  Abnormal x-ray of lumbar spine (03/16/2021); Closed compression fracture of L3 lumbar vertebra, sequela; History of compression fracture of vertebral column; Spinal column pain; Chronic knee  pain (Left); Osteoarthritis of knee (Left); Pain of patellofemoral joint (Left); Grade 1 Retrolisthesis of L2/L3; Lumbar facet joint pain; Chronic knee pain (Right); Low back pain of over 3 months duration; Intermittent low back pain; Mechanical low back pain; Multifactorial low back pain; and Osteoarthritis of knee (Right) on their pertinent problem list. Pain Assessment: Severity of   is reported as a  /10. Location:    / . Onset:  . Quality:  . Timing:  . Modifying factor(s):  SABRA Vitals:  temperature is 97.3 F (36.3 C) (abnormal). Her blood pressure is 148/83 (abnormal) and her pulse is 84. Her respiration is 17 and oxygen saturation is 100%.   Reason for encounter: interventional pain management therapy due pain of at least four (4) weeks in duration, with failure to respond and/or inability to tolerate more conservative care.  Site Confirmation: Charlene [name] was asked to confirm the procedure and laterality before marking the site.  Consent: Before the procedure and under the influence of no sedative(s), amnesic(s), or anxiolytics, the patient was informed of the treatment options, risks and possible complications. To fulfill our ethical and legal obligations, as recommended by the American Medical Association's Code of Ethics, I have informed the patient of my clinical impression; the nature and purpose of the treatment or procedure; the risks, benefits, and possible complications of the intervention; the alternatives, including doing nothing; the risk(s) and benefit(s) of the alternative treatment(s) or procedure(s); and the risk(s) and benefit(s) of doing nothing. The patient was provided information about the general risks and possible complications associated with the procedure. These may include, but are not  limited to: failure to achieve desired goals, infection, bleeding, organ or nerve damage, allergic reactions, paralysis, and death. In addition, the patient was informed of those risks and complications associated to Spine-related procedures, such as failure to decrease pain; infection (i.e.: Meningitis, epidural or intraspinal abscess); bleeding (i.e.: epidural hematoma, subarachnoid hemorrhage, or any other type of intraspinal or peri-dural bleeding); organ or nerve damage (i.e.: Any type of peripheral nerve, nerve root, or spinal cord injury) with subsequent damage to sensory, motor, and/or autonomic systems, resulting in permanent pain, numbness, and/or weakness of one or several areas of the body; allergic reactions; (i.e.: anaphylactic reaction); and/or death. Furthermore, the patient was informed of those risks and complications associated with the medications. These include, but are not limited to: allergic reactions (i.e.: anaphylactic or anaphylactoid reaction(s)); adrenal axis suppression; blood sugar elevation that in diabetics may result in ketoacidosis or comma; water retention that in patients with history of congestive heart failure may result in shortness of breath, pulmonary edema, and decompensation with resultant heart failure; weight gain; swelling or edema; medication-induced neural toxicity; particulate matter embolism and blood vessel occlusion with resultant organ, and/or nervous system infarction; and/or aseptic necrosis of one or more joints. Finally, the patient was informed that Medicine is not an exact science; therefore, there is also the possibility of unforeseen or unpredictable risks and/or possible complications that may result in a catastrophic outcome. The patient indicated having understood very clearly. We have given the patient no guarantees and we have made no promises. Enough time was given to the patient to ask questions, all of which were answered to the patient's  satisfaction. Charlene [name] has indicated that she wanted to continue with the procedure. Attestation: I, the ordering provider, attest that I have discussed with the patient the benefits, risks, side-effects, alternatives, likelihood of achieving goals, and potential problems during recovery for the procedure that  I have provided informed consent.  Date  Time: 10/20/2023  2:17 PM  Description of procedure  Start Time: 1359 hrs  Local Anesthesia: Once the patient was positioned, prepped, and time-out was completed. The target area was identified located. The skin was marked with an approved surgical skin marker. Once marked, the skin (epidermis, dermis, and hypodermis), and deeper tissues (fat, connective tissue and muscle) were infiltrated with a small amount of a short-acting local anesthetic, loaded on a 10cc syringe with a 25G, 1.5-in  Needle. An appropriate amount of time was allowed for local anesthetics to take effect before proceeding to the next step. Local Anesthetic: Lidocaine  1-2% The unused portion of the local anesthetic was discarded in the proper designated containers. Safety Precautions: Aspiration looking for blood return was conducted prior to all injections. At no point did I inject any substances, as a needle was being advanced. Before injecting, the patient was told to immediately notify me if she was experiencing any new onset of ringing in the ears, or metallic taste in the mouth. No attempts were made at seeking any paresthesias. Safe injection practices and needle disposal techniques used. Medications properly checked for expiration dates. SDV (single dose vial) medications used. After the completion of the procedure, all disposable equipment used was discarded in the proper designated medical waste containers.  Technical description: Protocol guidelines were followed. After positioning, the target area was identified and prepped in the usual manner. Skin & deeper tissues  infiltrated with local anesthetic. Appropriate amount of time allowed to pass for local anesthetics to take effect. Proper needle placement secured. Once satisfactory needle placement was confirmed, I proceeded to inject the desired solution in slow, incremental fashion, intermittently assessing for discomfort or any signs of abnormal or undesired spread of substance. Once completed, the needle was removed and disposed of, as per hospital protocols. The area was cleaned, making sure to leave some of the prepping solution back to take advantage of its long term bactericidal properties.  Aspiration:  Negative        Vitals:   10/20/23 1416  BP: (!) 148/83  Pulse: 84  Resp: 17  Temp: (!) 97.3 F (36.3 C)  SpO2: 100%    End Time: 1403 hrs  Imaging guidance  Imaging-assisted Technique: None required. Indication(s): N/A Exposure Time: N/A Contrast: None Fluoroscopic Guidance: N/A Ultrasound Guidance: N/A Interpretation: N/A  Post-op assessment  Post-procedure Vital Signs:  Pulse/HCG Rate: 84  Temp: (!) 97.3 F (36.3 C) Resp: 17 BP: (!) 148/83 SpO2: 100 %  EBL: None  Complications: No immediate post-treatment complications observed by team, or reported by patient.  Note: The patient tolerated the entire procedure well. A repeat set of vitals were taken after the procedure and the patient was kept under observation following institutional policy, for this type of procedure. Post-procedural neurological assessment was performed, showing return to baseline, prior to discharge. The patient was provided with post-procedure discharge instructions, including a section on how to identify potential problems. Should any problems arise concerning this procedure, the patient was given instructions to immediately contact us , at any time, without hesitation. In any case, we plan to contact the patient by telephone for a follow-up status report regarding this interventional  procedure.  Comments:  No additional relevant information.  Plan of care  Chronic Opioid Analgesic:  Analgesic: Tramadol  50 mg, 1 tab PO QD. ABNORMAL UDS (09/03/2020) (+) for COCAINE. MME: 5 mg/day    Medications administered: We administered pentafluoroprop-tetrafluoroeth, lidocaine  HCl (PF), ropivacaine  (PF)  2 mg/mL (0.2%), and Hyaluronan.    Interventional Therapies  Risk Factors  Considerations:   NOTE: UNCONTROLLED IDDM (NO STEROIDS)  ABNORMAL UDS (09/03/2020) (+) for COCAINE       Planned  Pending:  (NO STEROIDS)  Therapeutic right IA Monovisc knee injection #1  (07/14/2023) I have recommended to the patient to consider the use of a TENS unit.  (Information provided)    Under consideration:   Possible bilateral lumbar facet RFA #1  Diagnostic bilateral genicular NB #1    Completed:   Diagnostic right lumbar facet MBB x6 (09/22/2023) (100/100/100/100) (no steroid) (Diagnostic - local anesthetics only.)  Diagnostic left lumbar facet MBB x3 (09/22/2023) (100/100/100/100) (no steroid) (Diagnostic - local anesthetics only.)  Therapeutic left L3-4 LESI x1 (02/11/2022) (100/100/75/100)  Therapeutic left L4-5 LESI x4 (09/21/2018) (100/100/50/70)  Therapeutic left L5 TFESI x4 (09/21/2018) (100/100/50/70)  Therapeutic left L5-S1 LESI x2 (02/25/2018) (100/100/100/100)  Therapeutic bilateral Hyalgan knee inj. x5 (02/25/2018) (100/100/100 x2 weeks/<50)  Therapeutic left IA steroid knee inj. x1 (11/23/2017) (100/100/0/0)  Therapeutic left IA Gelsyn-3 knee inj. x4 (06/30/2023) (100/100/50/50)  Therapeutic left IA Monovisc knee inj. x1 (03/18/2022) (100/100/50/50)    Therapeutic  Palliative (PRN) options:  (NO PRN PROCEDURES W/O MD F2F EVALUATION) Palliative L5 TFESI  Palliative L4-5 LESI   Palliative L5-S1 LESI   Palliative Hyalgan knee inj.       Follow-up plan:   Return in about 2 weeks (around 11/03/2023) for (Face2F), (VV).     Recent Visits Date Type Provider Dept   10/07/23 Office Visit Tanya Glisson, MD [facility]  09/22/23 Procedure visit Tanya Glisson, MD [facility]  08/17/23 Office Visit Tanya Glisson, MD [facility]  07/28/23 Procedure visit Tanya Glisson, MD [facility]  Showing recent visits within past 90 days and meeting all other requirements Today's Visits Date Type Provider Dept  10/20/23 Procedure visit Tanya Glisson, MD [facility]  Showing today's visits and meeting all other requirements Future Appointments Date Type Provider Dept  11/04/23 Appointment Tanya Glisson, MD [facility]  12/21/23 Appointment Patel, Seema K, NP [facility]  Showing future appointments within next 90 days and meeting all other requirements   Disposition: Discharge home  Discharge (Date  Time): 10/20/2023; 1409 hrs.   Primary Care Physician: Sadie Manna, MD Location: [facility] Note by: Glisson DELENA Tanya, MD Date: 10/20/2023; Time: 2:41 PM  DISCLAIMER: Medicine is not an Visual merchandiser. It has no guarantees or warranties. The decision to proceed with this intervention was based on the information collected from the patient. Conclusions were drawn from the patient's questionnaire, interview, and examination. Because information was provided in large part by the patient, it cannot be guaranteed that it has not been purposely or unconsciously manipulated or altered. Every effort has been made to obtain as much accurate, relevant, available data as possible. Always take into account that the treatment will also be dependent on availability of resources and existing treatment guidelines, considered by other Pain Management Specialists as being common knowledge and practice, at the time of the intervention. It is also important to point out that variation in procedural techniques and pharmacological choices are the acceptable  norm. For Medico-Legal review purposes, the indications, contraindications, technique, and results of the these procedures should only be evaluated, judged and interpreted by a Board-Certified Interventional Pain Specialist with extensive familiarity and expertise in the same exact procedure and technique.

## 2023-10-21 ENCOUNTER — Telehealth: Payer: Self-pay | Admitting: *Deleted

## 2023-11-03 NOTE — Progress Notes (Unsigned)
 Unsuccessful attempt to contact patient for Virtual Visit (Pain Management Telehealth)   Patient provided contact information:  [phone] (home); [phone] (mobile); (Preferred) [phone] [email or web address]   Pre-screening:  Our staff was unsuccessful in contacting Ms. Cataldi using the above provided information.   I unsuccessfully attempted to make contact with Meade LITTIE Chesley on 11/04/2023 via telephone. I was unable to complete the virtual encounter due to call going directly to voicemail. I was able to leave a message.  Pharmacotherapy Assessment  Analgesic:  Chronic Opioid Analgesic:  Analgesic: Tramadol  50 mg, 1 tab PO QD. ABNORMAL UDS (09/03/2020) (+) for COCAINE. MME: 5 mg/day    Follow-up plan:   Reschedule Visit.     Interventional Therapies  Risk Factors  Considerations:   NOTE: UNCONTROLLED IDDM (NO STEROIDS)  ABNORMAL UDS (09/03/2020) (+) for COCAINE       Planned  Pending:  (NO STEROIDS)  Therapeutic right IA Monovisc knee injection #1  (07/14/2023) I have recommended to the patient to consider the use of a TENS unit.  (Information provided)    Under consideration:   Possible bilateral lumbar facet RFA #1  Diagnostic bilateral genicular NB #1    Completed:   Diagnostic right lumbar facet MBB x6 (09/22/2023) (100/100/100/100) (no steroid) (Diagnostic - local anesthetics only.)  Diagnostic left lumbar facet MBB x3 (09/22/2023) (100/100/100/100) (no steroid) (Diagnostic - local anesthetics only.)  Therapeutic left L3-4 LESI x1 (02/11/2022) (100/100/75/100)  Therapeutic left L4-5 LESI x4 (09/21/2018) (100/100/50/70)  Therapeutic left L5 TFESI x4 (09/21/2018) (100/100/50/70)  Therapeutic left L5-S1 LESI x2 (02/25/2018) (100/100/100/100)  Therapeutic bilateral Hyalgan knee inj. x5 (02/25/2018) (100/100/100 x2 weeks/<50)  Therapeutic left IA steroid knee inj. x1 (11/23/2017) (100/100/0/0)  Therapeutic left IA Gelsyn-3 knee inj. x4 (06/30/2023) (100/100/50/50)   Therapeutic left IA Monovisc knee inj. x1 (03/18/2022) (100/100/50/50)    Therapeutic  Palliative (PRN) options:  (NO PRN PROCEDURES W/O MD F2F EVALUATION) Palliative L5 TFESI  Palliative L4-5 LESI   Palliative L5-S1 LESI   Palliative Hyalgan knee inj.       Recent Visits Date Type Provider Dept  10/20/23 Procedure visit Tanya Glisson, MD [facility]  10/07/23 Office Visit Tanya Glisson, MD [facility]  09/22/23 Procedure visit Tanya Glisson, MD [facility]  08/17/23 Office Visit Tanya Glisson, MD [facility]  Showing recent visits within past 90 days and meeting all other requirements Today's Visits Date Type Provider Dept  11/04/23 Office Visit Tanya Glisson, MD [facility]  Showing today's visits and meeting all other requirements Future Appointments Date Type Provider Dept  12/21/23 Appointment Patel, Seema K, NP [facility]  Showing future appointments within next 90 days and meeting all other requirements   Note by: Glisson DELENA Tanya, MD Date: 11/04/2023; Time: 1:54 PM

## 2023-11-04 ENCOUNTER — Ambulatory Visit: Attending: Pain Medicine | Admitting: Pain Medicine

## 2023-11-04 DIAGNOSIS — Z91199 Patient's noncompliance with other medical treatment and regimen due to unspecified reason: Secondary | ICD-10-CM

## 2023-11-04 DIAGNOSIS — M1711 Unilateral primary osteoarthritis, right knee: Secondary | ICD-10-CM

## 2023-11-04 DIAGNOSIS — Z09 Encounter for follow-up examination after completed treatment for conditions other than malignant neoplasm: Secondary | ICD-10-CM

## 2023-11-04 DIAGNOSIS — M25561 Pain in right knee: Secondary | ICD-10-CM

## 2023-11-04 DIAGNOSIS — G8929 Other chronic pain: Secondary | ICD-10-CM

## 2023-11-04 DIAGNOSIS — M79604 Pain in right leg: Secondary | ICD-10-CM

## 2023-11-10 ENCOUNTER — Telehealth: Payer: Self-pay | Admitting: Pain Medicine

## 2023-11-10 NOTE — Telephone Encounter (Signed)
 Attempted to call patient to clarify what she is asking. Message left.

## 2023-11-10 NOTE — Telephone Encounter (Signed)
 PT said she failed and her doctor said to ask if she could the durlane shot. She spelled it.

## 2023-11-11 NOTE — Telephone Encounter (Signed)
 Requesting a knee injection. I informed her that she would need an eval appt first. Please call patient to schedule.

## 2023-11-12 NOTE — Progress Notes (Signed)
 PROVIDER NOTE: Interpretation of information contained herein should be left to medically-trained personnel. Specific patient instructions are provided elsewhere under Patient Instructions section of medical record. This document was created in part using AI and STT-dictation technology, any transcriptional errors that may result from this process are unintentional.  Patient: Charlene [name]  Service: E/M   PCP: Sadie Manna, MD  DOB: [birth year]  DOS: 11/16/2023  Provider: Eric DELENA Como, MD  MRN: [id number]  Delivery: Face-to-face  Specialty: Interventional Pain Management  Type: Established Patient  Setting: Ambulatory outpatient facility  Specialty designation: 09  Referring Prov.: Sadie Manna, MD  Location: Outpatient office facility       History of present illness (HPI) Charlene [name], a 62 y.o. year old female, is here today because of Charlene [name] Chronic pain of right knee [M25.561, G89.29]. Charlene [name] primary complain today is Knee Pain (right)  Pertinent problems: Charlene [name] has Burning sensation of feet; Neuropathic pain; Neuropathy; Lower extremity numbness and tingling (Left); Polyneuropathy associated with underlying disease; Chronic ankle pain (Left); Chronic foot pain (Left); Chronic lower extremity pain (2ry area of Pain) (Bilateral) (L>R); Chronic knee pain (4th area of Pain) (Bilateral) (L>R); Chronic low back pain (3ry area of Pain) (Bilateral) (L>R) w/ sciatica (Left); Chronic pain syndrome; DM type 2 with diabetic peripheral neuropathy (HCC); DDD (degenerative disc disease), lumbar; Lumbar facet arthropathy; Lumbar facet syndrome; Lumbar facet hypertrophy; Lumbar foraminal stenosis (L5-S1) (Right); Osteoarthritis of knee (Bilateral); Chronic musculoskeletal pain; Abnormal EMG (07/06/2017); Osteoarthritis involving multiple joints; 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); Knee pain; Back pain with left-sided sciatica; Meningioma (HCC); Neck  pain; Chronic pain disorder; Left leg weakness; Meralgia paraesthetica, left; Pain in both lower extremities; Arthropathy of lumbar facet joint; Chronic ankle and foot pain (1ry area of Pain) (Left); 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); 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); Spondylosis without myelopathy or radiculopathy, lumbosacral region; Chronic low back pain (Bilateral) w/o sciatica; Chronic sacroiliac joint pain (Right); Abnormal x-ray of lumbar spine (03/16/2021); Closed compression fracture of L3 lumbar vertebra, sequela; History of compression fracture of vertebral column; Spinal column pain; Chronic knee pain (Left); Osteoarthritis of knee (Left); Pain of patellofemoral joint (Left); Grade 1 Retrolisthesis of L2/L3; Lumbar facet joint pain; Chronic knee pain (Right); Low back pain of over 3 months duration; Intermittent low back pain; Mechanical low back pain; Multifactorial low back pain; and Osteoarthritis of knee (Right) on their pertinent problem list.  Pain Assessment: Severity of Chronic pain is reported as a 2 /10. Location: Knee Right/denies. Onset: More than a month ago. Quality: Dull. Timing: Constant. Modifying factor(s): Tramadol , Tylenol . Vitals:  height is 4' 11 (1.499 m) and weight is 150 lb (68 kg). Charlene [name] temporal temperature is 97.6 F (36.4 C). Charlene [name] blood pressure is 174/90 (abnormal) and Charlene [name] pulse is 82. Charlene [name] respiration is 18 and oxygen saturation is 100%.  BMI: Estimated body mass index is 30.3 kg/m as calculated from the following:   Height as of this encounter: 4' 11 (1.499 m).   Weight as of this encounter: 150 lb (68 kg).  Last encounter: 11/04/2023. Last procedure: 10/20/2023.  Reason for encounter: post-procedure evaluation and assessment.   Discussed the use of AI scribe software for clinical note transcription with the patient, who gave verbal consent to proceed.  History of Present Illness   Charlene [name] is a 63 year old female who  presents with knee pain and swelling following a Monovisc injection.  She received a  right-sided Monovisc injection on October 20, 2023, with no benefit and subsequent knee swelling. An X-ray revealed arthritis and fluid accumulation. Charlene [name] knee is painful, red, and swollen, with no relief from swelling or pain since the injection. There is no fever or signs of infection present.      Post-Procedure Evaluation   Type:  Monovisc Intra-articular Knee Injection #1 (5 cc of fluid removed from the right knee before injection) Laterality: Right (-RT) Level/approach: Lateral Imaging guidance: None required ([id number]) Anesthesia: Local anesthesia (1-2% Lidocaine ) Anxiolysis: None                 Sedation: No Sedation                       DOS: 10/20/2023  Performed by: Eric DELENA Como, MD  Purpose: Diagnostic/Therapeutic Indications: Knee arthralgia associated to osteoarthritis of the knee 1. Chronic knee pain (Right)   2. Osteoarthritis of knee (Right)   3. Pain in both lower extremities   4. Chronic pain syndrome    NAS-11 score:   Pre-procedure:  /10   Post-procedure:  /10     Effectiveness:  Initial hour after procedure: 0 %. Subsequent 4-6 hours post-procedure: 0 %. Analgesia past initial 6 hours: 0 %. Ongoing improvement:  Analgesic: The patient indicates having had a flareup of Charlene [name] pain on the right knee with swelling of the knee.  She went ahead and had it evaluated by the orthopedic surgeons who recommended that she had an injection of Durolane.  Essentially this is Halonate but we do not have it available in the hospital formulary.  She will go ahead and try this at the orthopedic surgeons office.  I have reminded the patient that just like with the Monovisc, she could also have an allergic reaction to it.  We talked about other options including genicular nerve blocks. Function: No improvement ROM: No improvement  Pharmacotherapy Assessment   Chronic Opioid Analgesic:   Analgesic: Tramadol  50 mg, 1 tab PO QD. ABNORMAL UDS (09/03/2020) (+) for COCAINE. MME: 5 mg/day   Monitoring: [place] PMP: PDMP reviewed during this encounter.       Pharmacotherapy: No side-effects or adverse reactions reported. Compliance: No problems identified. Effectiveness: Clinically acceptable.  No notes on file  UDS:  Summary  Date Value Ref Range Status  05/11/2023 FINAL  Final    Comment:    ==================================================================== [employer] Select 13 (MW) ==================================================================== Test                             Result       Flag       Units  Drug Absent but Declared for Prescription Verification   Tramadol                        Not Detected UNEXPECTED ng/mg creat ==================================================================== Test                      Result    Flag   Units      Ref Range   Creatinine              21               mg/dL      >=79 ==================================================================== Declared Medications:  The flagging and interpretation on this report are based on the  following declared medications.  Unexpected results may  arise from  inaccuracies in the declared medications.   **Note: The testing scope of this panel includes these medications:   Tramadol  (Ultram )   **Note: The testing scope of this panel does not include the  following reported medications:   Albuterol (Ventolin HFA)  Alendronate (Fosamax)  Amlodipine  (Norvasc )  Baclofen  (Lioresal )  Carvedilol  (Coreg )  Dicyclomine  (Bentyl )  Duloxetine  (Cymbalta )  Empagliflozin (Jardiance)  Glucagon  Hydralazine (Apresoline)  Insulin  (NovoLog )  Losartan (Cozaar)  Magnesium   Meloxicam  (Mobic )  Metformin (Glucophage)  Nortriptyline (Pamelor)  Ondansetron  (Zofran )  Pantoprazole  (Protonix )  Polyethylene Glycol (MiraLAX )  Pravastatin (Pravachol)  Pregabalin (Lyrica)  Sucralfate (Carafate)   Vitamin D  ==================================================================== For clinical consultation, please call [phone]. ====================================================================     No results found for: CBDTHCR No results found for: D8THCCBX No results found for: D9THCCBX  ROS  Constitutional: Denies any fever or chills Gastrointestinal: No reported hemesis, hematochezia, vomiting, or acute GI distress Musculoskeletal: Denies any acute onset joint swelling, redness, loss of ROM, or weakness Neurological: No reported episodes of acute onset apraxia, aphasia, dysarthria, agnosia, amnesia, paralysis, loss of coordination, or loss of consciousness  Medication Review  Cholecalciferol , DULoxetine , FreeStyle Libre Sensor System, Glucagon HCl (Diagnostic), Insulin  Aspart FlexPen, Magnesium , albuterol, alendronate, amLODipine , baclofen , carvedilol , dicyclomine , empagliflozin, hydrALAZINE, insulin  glargine (2 Unit Dial), insulin  lispro, losartan, meloxicam , metFORMIN, nortriptyline, ondansetron , pantoprazole , polyethylene glycol, pravastatin, pregabalin, sucralfate, and traMADol   History Review  Allergy: Charlene [name] is allergic to semaglutide. Drug: Charlene [name]  reports no history of drug use. Alcohol:  reports that she does not currently use alcohol. Tobacco:  reports that she has never smoked. She has never used smokeless tobacco. Social: Charlene [name]  reports that she has never smoked. She has never used smokeless tobacco. She reports that she does not currently use alcohol. She reports that she does not use drugs. Medical:  has a past medical history of Arthritis, Degenerative disc disease, lumbar, Diabetes mellitus without complication (HCC), GERD (gastroesophageal reflux disease), Hypertension, Neuropathy, Polyneuropathy, and Polyneuropathy. Surgical: Charlene [name]  has a past surgical history that includes arthroscopic rotator cuff; Colonoscopy; Abdominal  hysterectomy; endosopic sinus; Cataract extraction w/PHACO (Right, 11/25/2017); and Eye surgery. Family: family history includes Alcohol abuse in Charlene [name] father; Breast cancer in Charlene [name] maternal aunt; Cancer in Charlene [name] father; Pancreatic cancer in Charlene [name] maternal aunt.  Laboratory Chemistry Profile   Renal Lab Results  Component Value Date   BUN 38 (H) 01/18/2023   CREATININE 2.10 (H) 01/18/2023   BCR 17 10/12/2017   GFRAA >60 12/23/2018   GFRNONAA 26 (L) 01/18/2023    Hepatic Lab Results  Component Value Date   AST 29 01/18/2023   ALT 21 01/18/2023   ALBUMIN 3.9 01/18/2023   ALKPHOS 94 01/18/2023   LIPASE 27 01/18/2023    Electrolytes Lab Results  Component Value Date   NA 136 01/18/2023   K 4.6 01/18/2023   CL 100 01/18/2023   CALCIUM  9.1 01/18/2023   MG 2.0 10/12/2017    Bone Lab Results  Component Value Date   25OHVITD1 17 (L) 10/12/2017   25OHVITD2 <1.0 10/12/2017   25OHVITD3 17 10/12/2017    Inflammation (CRP: Acute Phase) (ESR: Chronic Phase) Lab Results  Component Value Date   CRP 4 10/12/2017   ESRSEDRATE 12 10/12/2017         Note: Above Lab results reviewed.  Recent Imaging Review  DG PAIN CLINIC C-ARM 1-60 MIN NO REPORT Fluoro was used, but no Radiologist interpretation will be provided.  Please refer to NOTES tab  for provider progress note. Note: Reviewed        Physical Exam  Vitals: BP (!) 174/90 (Cuff Size: Normal)   Pulse 82   Temp 97.6 F (36.4 C) (Temporal)   Resp 18   Ht 4' 11 (1.499 m)   Wt 150 lb (68 kg)   SpO2 100%   BMI 30.30 kg/m  BMI: Estimated body mass index is 30.3 kg/m as calculated from the following:   Height as of this encounter: 4' 11 (1.499 m).   Weight as of this encounter: 150 lb (68 kg). Ideal: Patient must be at least 60 in tall to calculate ideal body weight General appearance: Well nourished, well developed, and well hydrated. In no apparent acute distress Mental status: Alert, oriented x 3 (person, place, & time)        Respiratory: No evidence of acute respiratory distress Eyes: PERLA   Assessment   Diagnosis Status  1. Chronic knee pain (Right)   2. Pain in both lower extremities   3. Postop check    Controlled Controlled Controlled   Updated Problems: No problems updated.  Plan of Care  Problem-specific:  Assessment and Plan    Right knee pain with allergic reaction to Monovisc injection   Right knee pain and swelling occurred after a Monovisc injection, suggesting a possible allergic reaction. Allergic reactions can cause swelling, redness, and pain without infection. Check the formulary for Durolane availability. If unavailable, coordinate with the orthopedic surgeon's office for administration. Consider a genicular nerve block if Durolane does not provide relief.  Chronic low back pain   Chronic low back pain exacerbates intermittently, especially with prolonged standing. Previous facet injections without steroids have been effective, considering Charlene [name] diabetes. Offer facet injections without steroids if pain becomes unmanageable. Manage on a PRN basis; she will call if intervention is needed.       Charlene [name] has a current medication list which includes the following long-term medication(s): albuterol, amlodipine , baclofen , carvedilol , cholecalciferol , dicyclomine , duloxetine , hydralazine, toujeo  max solostar, insulin  lispro, losartan, magnesium , nortriptyline, pantoprazole , pravastatin, pregabalin, sucralfate, and tramadol .  Pharmacotherapy (Medications Ordered): No orders of the defined types were placed in this encounter.  Orders:  Orders Placed This Encounter  Procedures   Nursing Instructions:    Please complete this patient's postprocedure evaluation.    Scheduling Instructions:     Please complete this patient's postprocedure evaluation.     Interventional Therapies  Risk Factors  Considerations:   NOTE: UNCONTROLLED IDDM (NO STEROIDS)  ABNORMAL UDS (09/03/2020)  (+) for COCAINE       Planned  Pending:  (NO STEROIDS)  Therapeutic right IA Monovisc knee injection #1  (07/14/2023) I have recommended to the patient to consider the use of a TENS unit.  (Information provided)    Under consideration:   Possible bilateral lumbar facet RFA #1  Diagnostic bilateral genicular NB #1    Completed:   Diagnostic right lumbar facet MBB x6 (09/22/2023) (100/100/100/100) (no steroid) (Diagnostic - local anesthetics only.)  Diagnostic left lumbar facet MBB x3 (09/22/2023) (100/100/100/100) (no steroid) (Diagnostic - local anesthetics only.)  Therapeutic left L3-4 LESI x1 (02/11/2022) (100/100/75/100)  Therapeutic left L4-5 LESI x4 (09/21/2018) (100/100/50/70)  Therapeutic left L5 TFESI x4 (09/21/2018) (100/100/50/70)  Therapeutic left L5-S1 LESI x2 (02/25/2018) (100/100/100/100)  Therapeutic bilateral Hyalgan knee inj. x5 (02/25/2018) (100/100/100 x2 weeks/<50)  Therapeutic left IA steroid knee inj. x1 (11/23/2017) (100/100/0/0)  Therapeutic left IA Gelsyn-3 knee inj. x4 (06/30/2023) (100/100/50/50)  Therapeutic left IA Monovisc  knee inj. x1 (03/18/2022) (100/100/50/50)    Therapeutic  Palliative (PRN) options:  (NO PRN PROCEDURES W/O MD F2F EVALUATION) Palliative L5 TFESI  Palliative L4-5 LESI   Palliative L5-S1 LESI   Palliative Hyalgan knee inj.        No follow-ups on file.    Recent Visits Date Type Provider Dept  11/04/23 Office Visit Tanya Glisson, MD [facility]  10/20/23 Procedure visit Tanya Glisson, MD [facility]  10/07/23 Office Visit Tanya Glisson, MD [facility]  09/22/23 Procedure visit Tanya Glisson, MD [facility]  Showing recent visits within past 90 days and meeting all other requirements Today's Visits Date Type Provider Dept  11/16/23 Office Visit Tanya Glisson, MD [facility]  Showing today's visits and meeting all other requirements Future  Appointments Date Type Provider Dept  12/21/23 Appointment Patel, Seema K, NP [facility]  Showing future appointments within next 90 days and meeting all other requirements  I discussed the assessment and treatment plan with the patient. The patient was provided an opportunity to ask questions and all were answered. The patient agreed with the plan and demonstrated an understanding of the instructions.  Patient advised to call back or seek an in-person evaluation if the symptoms or condition worsens.  Duration of encounter: 30 minutes.  Total time on encounter, as per AMA guidelines included both the face-to-face and non-face-to-face time personally spent by the physician and/or other qualified health care professional(s) on the day of the encounter (includes time in activities that require the physician or other qualified health care professional and does not include time in activities normally performed by clinical staff). Physician's time may include the following activities when performed: Preparing to see the patient (e.g., pre-charting review of records, searching for previously ordered imaging, lab work, and nerve conduction tests) Review of prior analgesic pharmacotherapies. Reviewing PMP Interpreting ordered tests (e.g., lab work, imaging, nerve conduction tests) Performing post-procedure evaluations, including interpretation of diagnostic procedures Obtaining and/or reviewing separately obtained history Performing a medically appropriate examination and/or evaluation Counseling and educating the patient/family/caregiver Ordering medications, tests, or procedures Referring and communicating with other health care professionals (when not separately reported) Documenting clinical information in the electronic or other health record Independently interpreting results (not separately reported) and communicating results to the patient/ family/caregiver Care coordination (not  separately reported)  Note by: Glisson DELENA Tanya, MD (TTS and AI technology used. I apologize for any typographical errors that were not detected and corrected.) Date: 11/16/2023; Time: 4:08 PM

## 2023-11-16 ENCOUNTER — Encounter: Payer: Self-pay | Admitting: Pain Medicine

## 2023-11-16 ENCOUNTER — Ambulatory Visit: Attending: Pain Medicine | Admitting: Pain Medicine

## 2023-11-16 VITALS — BP 174/90 | HR 82 | Temp 97.6°F | Resp 18 | Ht 59.0 in | Wt 150.0 lb

## 2023-11-16 DIAGNOSIS — Z09 Encounter for follow-up examination after completed treatment for conditions other than malignant neoplasm: Secondary | ICD-10-CM | POA: Diagnosis present

## 2023-11-16 DIAGNOSIS — M79605 Pain in left leg: Secondary | ICD-10-CM | POA: Insufficient documentation

## 2023-11-16 DIAGNOSIS — M79604 Pain in right leg: Secondary | ICD-10-CM | POA: Diagnosis not present

## 2023-11-16 DIAGNOSIS — M25561 Pain in right knee: Secondary | ICD-10-CM | POA: Diagnosis present

## 2023-11-16 DIAGNOSIS — G8929 Other chronic pain: Secondary | ICD-10-CM | POA: Insufficient documentation

## 2023-12-11 NOTE — Progress Notes (Signed)
 Charlene [name] is 63 y.o. female seen in follow up. She was last seen on 09/07/2023.     1) Type 2 diabetes. Her Hb A1c is 7.3%. She has a Libre 3 CGM which was downloaded and reviewed. A 2 week blood glucose avg was 94 mg/dl. She had 57% of sugars in range, 4% above range and 39% below range. Sensor usage was just 14% of the time. Pattern shows readings in the 60s - 120 range throughout the day. She ran out of sensors, so only used the [place] for a few days during that time. She does have a glucometer as well. She reports when she checks a fingerstick blood glucose, her sugar is not low.   For diabetes, she is prescribed metformin 1000 mg BID, Lantus  8 units QHS, and NovoLog  insulin  before her supper meal 3 units + correction of 1 units if blood glucose 151 - 200 and 2 units if blood glucose over 201. She holds NovoLog  for blood glucose readings <80 mg/dl before the meal.  Prior medications prescribed for diabetes: Xigduo prescription not refilled as it was too costly.  Glucagon prescription never filled as it was too costly.  Toujeo  Max prescription not filled as it was too costly.   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  She receives intravitreal injections for retinopathy a retinal specialist (Dr Sharalyn) in [place]. She is up to date on annual eye exams.   2) Post-menopausal osteoporosis. A DEXA scan on 02/19/2022 was consistent with osteoporosis. There was interval improvement in the BMD in comparison to a prior DEXA dated 04/28/2019. Alendronate 70 mg weekly was started in 05/2019, stopped in 09/2021 and the restarted in 01/2022. No history of fractures. Not a smoker.  No calcium  or thyroid disorders.   3) Hypertension. Her BP is controlled today. She reports compliance with Coreg , losartan, amlodipine , and hydralazine.  4) Hyperkalemia. Labs last week notable for eGFR declined to 28 and potassium increased  to 5.7 mmol/L.  Past Medical History:  Diagnosis Date  . Arthritis   . Cataract   . GERD (gastroesophageal reflux disease)   . Hypertension   . Meningioma (CMS/HHS-HCC)    s/o craniotomy 06/01/2019  . Osteoporosis   . Sleep apnea   . Stage 3 chronic kidney disease (CMS-HCC)   . Type 2 diabetes mellitus (CMS/HHS-HCC)     Outpatient Medications Marked as Taking for the 12/11/23 encounter (Office Visit) with [name], [name], MD  Medication Sig Dispense Refill  . albuterol MDI, PROVENTIL, VENTOLIN, PROAIR, HFA 90 mcg/actuation inhaler Inhale 2 inhalations into the lungs every 6 (six) hours as needed for Wheezing 1 each 2  . alendronate (FOSAMAX) 70 MG tablet TAKE 1 TABLET BY MOUTH ONCE WEEKLY ON MONDAY AT 7AM. TAKE WITH 6-8 OZ OF WATER AND REMAIN UPRIGHT FOR 30 MINUTES AFTER ADMINISTRATION 12 tablet 3  . amLODIPine  (NORVASC ) 10 MG tablet TAKE ONE TABLET (10 MG TOTAL) BY MOUTH DAILY 90 tablet 11  . baclofen  (LIORESAL ) 10 MG tablet TAKE ONE TABLET BY MOUTH THREE TIMES DAILY 90 tablet 2  . carvediloL  (COREG ) 25 MG tablet TAKE ONE TABLET (25 MG TOTAL) BY MOUTH TWICE DAILY WITH MEALS 180 tablet 1  . cholecalciferol  (VITAMIN D3) 1000 unit tablet TAKE ONE TABLET (1,000 UNITS TOTAL) BY MOUTH DAILY 90 tablet 1  . dicyclomine  (BENTYL ) 20 mg tablet TAKE ONE TABLET BY MOUTH FOUR TIMES DAILY AS NEEDED 360 tablet 1  . DULoxetine  (  CYMBALTA ) 30 MG DR capsule TAKE ONE CAPSULE BY MOUTH TWICE DAILY 60 capsule 2  . empagliflozin (JARDIANCE) 25 mg tablet Take 1 tablet (25 mg total) by mouth once daily 90 tablet 3  . glucagon (GLUCAGEN) 1 mg/mL injection Inject 1 mL (1 mg total) into the muscle as directed for Low blood sugar 1 each 1  . hydrALAZINE (APRESOLINE) 25 MG tablet TAKE 1 TABLET BY MOUTH TWICE DAILY 180 tablet 1  . insulin  ASPART (NOVOLOG  FLEXPEN) pen injector (concentration 100 units/mL) Take 6-10 units before eating as directed. Max daily dose is 50 units 15 mL 12  . insulin  GLARGINE (LANTUS   SOLOSTAR U-100 INSULIN ) pen injector (concentration 100 units/mL) Inject 8 Units subcutaneously at bedtime 15 mL 3  . ketorolac  (ACULAR ) 0.5 % ophthalmic solution Place 1 drop into the left eye 4 (four) times daily    . losartan (COZAAR) 100 MG tablet TAKE ONE TABLET BY MOUTH ONCE DAILY 90 tablet 1  . MAGNESIUM  ORAL Take 2 tablets by mouth once daily    . meclizine HCl (MECLIZINE ORAL) Take by mouth Prescribed for nausea, patient has not taken    . meloxicam  (MOBIC ) 15 MG tablet Take 1 tablet (15 mg total) by mouth once daily 90 tablet 2  . metFORMIN (GLUCOPHAGE-XR) 500 MG XR tablet TAKE TWO TABLETS BY MOUTH TWICE DAILY 360 tablet 3  . nortriptyline (PAMELOR) 10 MG capsule TAKE ONE CAPSULE (10 MG) BY MOUTH IN THE MORNING 90 capsule 1  . nortriptyline (PAMELOR) 50 MG capsule Take 1 capsule (50 mg total) by mouth at bedtime 30 capsule 5  . ondansetron  (ZOFRAN -ODT) 4 MG disintegrating tablet TAKE ONE TABLET BY MOUTH EVERY 8 HOURS AS NEEDED for nausea and vomiting FOR 7 DAYS 14 tablet 0  . pantoprazole  (PROTONIX ) 40 MG DR tablet TAKE ONE TABLET BY MOUTH ONCE DAILY 90 tablet 0  . pravastatin (PRAVACHOL) 10 MG tablet TAKE ONE TABLET (10 MG TOTAL) BY MOUTH AT BEDTIME 90 tablet 1  . prednisoLONE acetate (PRED FORTE) 1 % ophthalmic suspension Place 1 drop into the left eye 4 (four) times daily    . pregabalin (LYRICA) 75 MG capsule TAKE ONE CAPSULE BY MOUTH THREE TIMES DAILY 90 capsule 2  . sucralfate (CARAFATE) 1 gram tablet TAKE ONE TABLET BY MOUTH TWICE DAILY BEFORE MEALS 60 tablet 2  . traMADol  (ULTRAM ) 50 mg tablet Take 1 tab three times a day (Patient taking differently: Take 50 mg by mouth every 8 (eight) hours as needed for Pain Take 1 tab three times a day) 90 tablet 1    Exam: BP 132/80   Pulse 85   Ht 149.9 cm (4' 11)   Wt 71.2 kg (157 lb)   LMP  (LMP Unknown)   SpO2 98%   BMI 31.71 kg/m  GEN: well developed female in NAD. SKIN: A bilateral bare foot exam shows no ulcerations or  calluses.  EXT: No peripheral edema PSYC: alert and oriented, good insight.  Labs: Appointment on 12/04/2023  Component Date Value Ref Range Status  . Hemoglobin A1C 12/04/2023 7.3 (H)  4.2 - 5.6 % Final  . Average Blood Glucose (Calc) 12/04/2023 163  mg/dL Final  . Glucose 88/85/7974 118 (H)  70 - 110 mg/dL Final  . Sodium 88/85/7974 137  136 - 145 mmol/L Final  . Potassium 12/04/2023 5.7 (H)  3.6 - 5.1 mmol/L Final  . Chloride 12/04/2023 103  97 - 109 mmol/L Final  . Carbon Dioxide (CO2) 12/04/2023 30.3  22.0 -  32.0 mmol/L Final  . Calcium  12/04/2023 9.6  8.7 - 10.3 mg/dL Final  . Urea Nitrogen (BUN) 12/04/2023 40 (H)  7 - 25 mg/dL Final  . Creatinine 88/85/7974 2.0 (H)  0.6 - 1.1 mg/dL Final  . Glomerular Filtration Rate (eGFR) 12/04/2023 28 (L)  >60 mL/min/1.73sq m Final   CKD-EPI (2021) does not include patient's race in the calculation of eGFR.  Monitoring changes of plasma creatinine and eGFR over time is useful for monitoring kidney function.   Interpretive Ranges for eGFR (CKD-EPI 2021):  eGFR:       >60 mL/min/1.73 sq. m - Normal eGFR:       30-59 mL/min/1.73 sq. m - Moderately Decreased eGFR:       15-29 mL/min/1.73 sq. m  - Severely Decreased eGFR:       < 15 mL/min/1.73 sq. m  - Kidney Failure    Note: These eGFR calculations do not apply in acute situations when eGFR is changing rapidly or patients on dialysis.  . BUN/Crea Ratio 12/04/2023 20.0  6.0 - 20.0 Final  . Anion Gap w/K 12/04/2023 9.4  6.0 - 16.0 Final    Assessment: 1. DM type 2 with diabetic peripheral neuropathy (CMS/HHS-HCC)   2. 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 Type 2 diabetes mellitus with microalbuminuria, with long-term current use of insulin  (CMS/HHS-HCC)   4. Type 2 diabetes mellitus with stage 3a chronic kidney disease, with long-term current use of insulin   (CMS/HHS-HCC)   5. Osteoporosis, post-menopausal   6. Hyperkalemia    Plan:   - Her Libre CGM is frequently showing low blood glucose readings. Today in clinic it showed her blood glucose was 69 mg/dl. I obtained a fingerstick blood glucose which was 89 mg/dl. The [place] is reading falsely low. She was reassured that sugars are likely not significantly low and she can use her glucometer as a back up to assess blood glucose any time.  - Continue Lantus  insulin  and NovoLog  insulin  as prescribed. - Continue use of FreeStyle Libre 3 CGM.   - Continue statin.  - Hypertension controlled.   - Continue regular follow up with ophthalmology, as planned.  - Will repeat a metB today. If eGFR remains <35, then will stop alendronate and metformin.  - Encouraged her to continue with daily calcium  and vit D supplements.  - Avoid falls.  - Her potassium was unusually high last week. Will recheck today. Asked her to be available to answer the phone when I call her this afternoon and she agreed. Avoid high K foods. May need to hold losartan, however she already took her dose today. - Follow up again in 3 months, with labs prior.    Addendum   12/11/2023  1:42 PM - Charlene [name], MLT  Component Value Flag Ref Range Units Status  Glucose 92  70 - 110 mg/dL Final  Sodium 862  863 - 145 mmol/L Final  Potassium 6.0  High  3.6 - 5.1 mmol/L Final  Chloride 107  97 - 109 mmol/L Final  Carbon Dioxide (CO2) 26.4  22.0 - 32.0 mmol/L Final  Calcium  9.3  8.7 - 10.3 mg/dL Final  Urea Nitrogen (BUN) 33  High  7 - 25 mg/dL Final  Creatinine 1.6  High  0.6 - 1.1 mg/dL Final  Glomerular Filtration Rate (eGFR) 36  Low  >60 mL/min/1.73sq m Final   Called patient with  lab result. See result note for details.  Will treat high K with kayexalate. Will hold losartan.  Will repeat metB in 3 days. Continue metformin and alendronate for now, however will consider stopping these next week if eGFR remains around 36 or lower.    I spent a total of 54 minutes in both face-to-face and non-face-to-face activities, excluding procedures performed, for this visit on the date of this encounter.

## 2023-12-21 ENCOUNTER — Encounter: Payer: Self-pay | Admitting: Nurse Practitioner

## 2023-12-21 ENCOUNTER — Ambulatory Visit: Attending: Nurse Practitioner | Admitting: Nurse Practitioner

## 2023-12-21 VITALS — BP 135/84 | HR 78 | Temp 97.3°F | Resp 18 | Ht 59.0 in | Wt 150.0 lb

## 2023-12-21 DIAGNOSIS — M47816 Spondylosis without myelopathy or radiculopathy, lumbar region: Secondary | ICD-10-CM | POA: Insufficient documentation

## 2023-12-21 DIAGNOSIS — G894 Chronic pain syndrome: Secondary | ICD-10-CM | POA: Insufficient documentation

## 2023-12-21 DIAGNOSIS — M5459 Other low back pain: Secondary | ICD-10-CM | POA: Diagnosis not present

## 2023-12-21 NOTE — Progress Notes (Signed)
 PROVIDER NOTE: Interpretation of information contained herein should be left to medically-trained personnel. Specific patient instructions are provided elsewhere under Patient Instructions section of medical record. This document was created in part using AI and STT-dictation technology, any transcriptional errors that may result from this process are unintentional.  Patient: Charlene [name]  Service: E/M   PCP: Sadie Manna, MD  DOB: 15-Feb-1960  DOS: 12/21/2023  Provider: Emmy MARLA Blanch, NP  MRN: [id number]  Delivery: Face-to-face  Specialty: Interventional Pain Management  Type: Established Patient  Setting: Ambulatory outpatient facility  Specialty designation: 09  Referring Prov.: Sadie Manna, MD  Location: Outpatient office facility       History of present illness (HPI) [name], a 63 y.o. year old female, is here today because of her Lumbar facet joint pain [M54.59]. [name] primary complain today is Back Pain and Knee Pain (right)  Pertinent problems: Ms. Tapanes has Burning sensation of feet; DM type 2, uncontrolled, with neuropathy; GERD without esophagitis; Neuropathic pain; Neuropathy; Lower extremity numbness and tingling (Left); Polyneuropathy associated with underlying disease; Chronic ankle pain (Left); Chronic foot pain (Left); Chronic lower extremity pain (2ry area of Pain) (Bilateral) (L>R); Chronic knee pain (4th area of Pain) (Bilateral) (L>R); Chronic low back pain (3ry area of Pain) (Bilateral) (L>R) w/ sciatica (Left); Chronic pain syndrome; Opiate use; Pharmacologic therapy; Disorder of skeletal system; DM type 2 with diabetic peripheral neuropathy (HCC); DDD (degenerative disc disease), lumbar; Lumbar facet arthropathy; Lumbar facet syndrome; Lumbar facet hypertrophy; Lumbar foraminal stenosis (L5-S1) (Right); Osteoarthritis of knee (Bilateral); Chronic musculoskeletal pain; Osteoarthritis involving multiple joints; Chronic use of opiate for therapeutic  purpose; Osteoporosis; Pain medication agreement signed; Chronic pain disorder; Diabetes mellitus (HCC); Arthropathy of lumbar facet joint; Chronic ankle and foot pain (1ry area of Pain) (Left); 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  Pain Assessment: Severity of Chronic pain is reported as a 1 /10. Location: Back Lower/denies. Onset: More than a month ago. Quality: Aching, Constant. Timing: Constant. Modifying factor(s): tramadol , heat. Vitals:  height is 4' 11 (1.499 m) and weight is 150 lb (68 kg). Her temperature is 97.3 F (36.3 C) (abnormal). Her blood pressure is 135/84 and her pulse is 78. Her respiration is 18 and oxygen saturation is 100%.  BMI: Estimated body mass index is 30.3 kg/m as calculated from the following:   Height as of this encounter: 4' 11 (1.499 m).   Weight as of this encounter: 150 lb (68 kg).  Last encounter: 06/24/2023. Last procedure: Visit date not found.  Reason for encounter: evaluation for possible interventional PM therapy/treatment.   Discussed the use of AI scribe software for clinical note transcription with the patient, who gave verbal consent to proceed.  History of Present Illness   Charlene [name] is a 63 year old female who presents with back pain and right knee pain.   She experiences bilateral back pain primarily across the back without radiation. The pain is exacerbated by bending, stretching, and standing for long periods, such as when baking.  She manages her pain with tramadol  and Tylenol . Tramadol  was prescribed on September 22, 2023, and is valid until March 20, 2024. She received a Lumbar facet block injection in September 2025, which provided significant (100%) on going pain relief and improve her low back pain symptoms.  She is  currently  experiencing significant low back pain, primarily across the back without radiation to further bilateral legs.  The pain is severe enough to impacts her daily activities.  We discussed to repeat lumbar facet block without steroids.   Her blood sugar levels have increased recently, with a recent lab result showing a level of seven, up from six.    Pharmacotherapy Assessment   Monitoring: [place] PMP: PDMP reviewed during this encounter.       Pharmacotherapy: No side-effects or adverse reactions reported. Compliance: No problems identified. Effectiveness: Clinically acceptable.  Rebecka Wolm HERO, RN  12/21/2023  9:47 AM  Sign when Signing Visit Nursing Pain Medication Assessment:  Safety precautions to be maintained throughout the outpatient stay will include: orient to surroundings, keep bed in low position, maintain call bell within reach at all times, provide assistance with transfer out of bed and ambulation.  Medication Inspection Compliance: Pill count conducted under aseptic conditions, in front of the patient. Neither the pills nor the bottle was removed from the patient's sight at any time. Once count was completed pills were immediately returned to the patient in their original bottle.  Medication: Tramadol  (Ultram ) Pill/Patch Count: 25 of 30 pills/patches remain Pill/Patch Appearance: Markings consistent with prescribed medication Bottle Appearance: Standard pharmacy container. Clearly labeled. Filled Date: 83 / 41 / 2025 Last Medication intake:  Today      UDS:  Summary  Date Value Ref Range Status  05/11/2023 FINAL  Final    Comment:    ==================================================================== [employer] Select 13 (MW) ==================================================================== Test                             Result       Flag       Units  Drug Absent but Declared for Prescription Verification   Tramadol                        Not Detected UNEXPECTED  ng/mg creat ==================================================================== Test                      Result    Flag   Units      Ref Range   Creatinine              21               mg/dL      >=79 ==================================================================== Declared Medications:  The flagging and interpretation on this report are based on the  following declared medications.  Unexpected results may arise from  inaccuracies in the declared medications.   **Note: The testing scope of this panel includes these medications:   Tramadol  (Ultram )   **Note: The testing scope of this panel does not include the  following reported medications:   Albuterol (Ventolin HFA)  Alendronate (Fosamax)  Amlodipine  (Norvasc )  Baclofen  (Lioresal )  Carvedilol  (Coreg )  Dicyclomine  (Bentyl )  Duloxetine  (Cymbalta )  Empagliflozin (Jardiance)  Glucagon  Hydralazine (Apresoline)  Insulin  (NovoLog )  Losartan (Cozaar)  Magnesium   Meloxicam  (Mobic )  Metformin (Glucophage)  Nortriptyline (Pamelor)  Ondansetron  (Zofran )  Pantoprazole  (Protonix )  Polyethylene Glycol (MiraLAX )  Pravastatin (Pravachol)  Pregabalin (Lyrica)  Sucralfate (Carafate)  Vitamin D  ==================================================================== For clinical consultation, please call [phone]. ====================================================================     No results found for: CBDTHCR No results found for: D8THCCBX No results found for: D9THCCBX  ROS  Constitutional: Denies any fever or chills Gastrointestinal: No reported hemesis, hematochezia, vomiting, or  acute GI distress Musculoskeletal: Low back pain, right knee pain Neurological: No reported episodes of acute onset apraxia, aphasia, dysarthria, agnosia, amnesia, paralysis, loss of coordination, or loss of consciousness  Medication Review  Cholecalciferol , DULoxetine , FreeStyle Libre Sensor System, Glucagon HCl (Diagnostic),  Insulin  Aspart FlexPen, Magnesium , albuterol, alendronate, amLODipine , baclofen , carvedilol , dicyclomine , empagliflozin, hydrALAZINE, insulin  glargine (2 Unit Dial), insulin  lispro, losartan, meloxicam , metFORMIN, nortriptyline, ondansetron , pantoprazole , polyethylene glycol, pravastatin, pregabalin, sucralfate, and traMADol   History Review  Allergy: Ms. Gernert is allergic to semaglutide. Drug: Ms. Vazguez  reports no history of drug use. Alcohol:  reports that she does not currently use alcohol. Tobacco:  reports that she has never smoked. She has never used smokeless tobacco. Social: Ms. Harold  reports that she has never smoked. She has never used smokeless tobacco. She reports that she does not currently use alcohol. She reports that she does not use drugs. Medical:  has a past medical history of Arthritis, Degenerative disc disease, lumbar, Diabetes mellitus without complication (HCC), GERD (gastroesophageal reflux disease), Hypertension, Neuropathy, Polyneuropathy, and Polyneuropathy. Surgical: Ms. Prohaska  has a past surgical history that includes arthroscopic rotator cuff; Colonoscopy; Abdominal hysterectomy; endosopic sinus; Cataract extraction w/PHACO (Right, 11/25/2017); and Eye surgery. Family: family history includes Alcohol abuse in her father; Breast cancer in her maternal aunt; Cancer in her father; Pancreatic cancer in her maternal aunt.  Laboratory Chemistry Profile   Renal Lab Results  Component Value Date   BUN 38 (H) 01/18/2023   CREATININE 2.10 (H) 01/18/2023   BCR 17 10/12/2017   GFRAA >60 12/23/2018   GFRNONAA 26 (L) 01/18/2023    Hepatic Lab Results  Component Value Date   AST 29 01/18/2023   ALT 21 01/18/2023   ALBUMIN 3.9 01/18/2023   ALKPHOS 94 01/18/2023   LIPASE 27 01/18/2023    Electrolytes Lab Results  Component Value Date   NA 136 01/18/2023   K 4.6 01/18/2023   CL 100 01/18/2023   CALCIUM  9.1 01/18/2023   MG 2.0 10/12/2017    Bone Lab Results   Component Value Date   25OHVITD1 17 (L) 10/12/2017   25OHVITD2 <1.0 10/12/2017   25OHVITD3 17 10/12/2017    Inflammation (CRP: Acute Phase) (ESR: Chronic Phase) Lab Results  Component Value Date   CRP 4 10/12/2017   ESRSEDRATE 12 10/12/2017         Note: Above Lab results reviewed.  Recent Imaging Review  DG PAIN CLINIC C-ARM 1-60 MIN NO REPORT Fluoro was used, but no Radiologist interpretation will be provided.  Please refer to NOTES tab for provider progress note. Note: Reviewed        Physical Exam  Vitals: BP 135/84   Pulse 78   Temp (!) 97.3 F (36.3 C)   Resp 18   Ht 4' 11 (1.499 m)   Wt 150 lb (68 kg)   SpO2 100%   BMI 30.30 kg/m  BMI: Estimated body mass index is 30.3 kg/m as calculated from the following:   Height as of this encounter: 4' 11 (1.499 m).   Weight as of this encounter: 150 lb (68 kg). Ideal: Patient must be at least 60 in tall to calculate ideal body weight General appearance: Well nourished, well developed, and well hydrated. In no apparent acute distress Mental status: Alert, oriented x 3 (person, place, & time)       Respiratory: No evidence of acute respiratory distress Eyes: PERLA  Musculoskeletal: +LBP (+ facet loading) Lumbar Exam  Skin & Axial Inspection: No  masses, redness, or swelling Alignment: Symmetrical Functional ROM: Pain restricted ROM affecting both sides Stability: No instability detected Muscle Tone/Strength: Functionally intact. No obvious neuro-muscular anomalies detected. Sensory (Neurological): Musculoskeletal pain pattern Palpation: No palpable anomalies       Provocative Tests: Hyperextension/rotation test: (+) bilaterally for facet joint pain. Lumbar quadrant test (Kemp's test): (+) bilaterally for facet joint pain. Lateral bending test: (+) due to pain. *(Flexion, ABduction and External Rotation) Assessment   Diagnosis Status  1. Lumbar facet joint pain   2. Lumbar facet arthropathy   3. Lumbar facet  hypertrophy   4. Lumbar facet syndrome   5. Chronic pain syndrome    Having a Flare-up Having a Flare-up Having a Flare-up   Updated Problems: No problems updated.  Plan of Care  Problem-specific:  Assessment and Plan    Chronic low back pain and lumbar spondylosis without myelopathy or radiculopathy Chronic low back pain worsens with movement. Blood sugar increased to 7.  - Scheduled lumbar facet block with sedation in six weeks without steroid - Consider TENS unit for pain management.   Plan: Return for (ECT): (B) L- FCT Blk (w/o steroids) #R7L4 with Dr. Naveira (without steroids)       Ms. JAIDIN UGARTE has a current medication list which includes the following long-term medication(s): albuterol, amlodipine , baclofen , carvedilol , cholecalciferol , dicyclomine , duloxetine , hydralazine, toujeo  max solostar, insulin  lispro, losartan, magnesium , nortriptyline, pantoprazole , pravastatin, pregabalin, sucralfate, and tramadol .  Pharmacotherapy (Medications Ordered): No orders of the defined types were placed in this encounter.  Orders:  Orders Placed This Encounter  Procedures   LUMBAR FACET(MEDIAL BRANCH NERVE BLOCK) MBNB    Diagnosis: Lumbar Facet Syndrome (M47.816); Lumbosacral Facet Syndrome (M47.817); Lumbar Facet Joint Pain (M54.59) Medical Necessity Statement: 1.Severe chronic axial low back pain causing functional impairment documented by ongoing pain scale assessments. 2.Pain present for longer than 3 months (Chronic) documented to have failed noninvasive conservative therapies. 3.Absence of untreated radiculopathy. 4.There is no radiological evidence of untreated fractures, tumor, infection, or deformity.  Physical Examination Findings: Positive Kemp Maneuver: (Y)  Positive Lumbar Hyperextension-Rotation provocative test: (Y)    Standing Status:   Future    Expiration Date:   03/20/2024    Scheduling Instructions:     Procedure: Lumbar facet Block     Type: Medial  Branch Block     Side: Bilateral     Purpose: Therapeutic     Level(s): L3-4, L4-5, L5-S1, and TBD by Fluoroscopic Mapping Facets (L2, L3, L4, L5, S1, and TBD Medial Branch)     Sedation: With Sedation.     Timeframe: 6 weeks from now.    Where will this procedure be performed?:   [facility]        Return in about 6 weeks (around 02/01/2024) for Return for (ECT): (B) L- FCT Blk (w/o steroids) #R7L4 with Dr. Naveira (without steroids).    Recent Visits Date Type Provider Dept  11/16/23 Office Visit Tanya Glisson, MD [facility]  11/04/23 Office Visit Tanya Glisson, MD [facility]  10/20/23 Procedure visit Tanya Glisson, MD [facility]  10/07/23 Office Visit Tanya Glisson, MD [facility]  09/22/23 Procedure visit Tanya Glisson, MD [facility]  Showing recent visits within past 90 days and meeting all other requirements Today's Visits Date Type Provider Dept  12/21/23 Office Visit Vernell Back K, NP [facility]  Showing today's visits and meeting all other requirements Future Appointments Date Type Provider Dept  01/28/24 Appointment  Tanya Glisson, MD [facility]  Showing future appointments within next 90 days and meeting all other requirements  I discussed the assessment and treatment plan with the patient. The patient was provided an opportunity to ask questions and all were answered. The patient agreed with the plan and demonstrated an understanding of the instructions.  Patient advised to call back or seek an in-person evaluation if the symptoms or condition worsens.  I personally spent a total of 20 minutes in the care of the patient today including preparing to see the patient, getting/reviewing separately obtained history, performing a medically appropriate exam/evaluation, counseling and educating, placing orders, referring and communicating with other health care  professionals, documenting clinical information in the EHR, independently interpreting results, communicating results, and coordinating care.   Note by: Van Seymore K Zonnique Norkus, NP (TTS and AI technology used. I apologize for any typographical errors that were not detected and corrected.) Date: 12/21/2023; Time: 1:54 PM

## 2023-12-21 NOTE — Progress Notes (Signed)
 Nursing Pain Medication Assessment:  Safety precautions to be maintained throughout the outpatient stay will include: orient to surroundings, keep bed in low position, maintain call bell within reach at all times, provide assistance with transfer out of bed and ambulation.  Medication Inspection Compliance: Pill count conducted under aseptic conditions, in front of the patient. Neither the pills nor the bottle was removed from the patient's sight at any time. Once count was completed pills were immediately returned to the patient in their original bottle.  Medication: Tramadol  (Ultram ) Pill/Patch Count: 25 of 30 pills/patches remain Pill/Patch Appearance: Markings consistent with prescribed medication Bottle Appearance: Standard pharmacy container. Clearly labeled. Filled Date: 2 / 90 / 2025 Last Medication intake:  Today

## 2023-12-21 NOTE — Patient Instructions (Signed)
 Medial Branch Nerve Block  Medial branch nerve block is a procedure to numb the nerves that supply the joints between your spinal bones (facet joints). The facet joints are located on the back of your spine. You may have the procedure on your neck or your upper, middle, or lower spine. This procedure is often a diagnostic procedure done to determine if the facet joint is the source of pain. During this procedure, your health care provider will inject a long-acting anesthetic to numb the medial nerves near the facet joint that is being treated. If more than one facet joint is causing pain, you may have more than one injection. In some cases, an anti-inflammatory medicine (steroid) will also be injected. You may need this procedure if: Your health care provider wants to diagnose a facet joint as the cause of your pain. You have an injury to a facet joint. You have back pain from wear and tear (osteoarthritis) of your facet joint. If the pain was from the facet joints, pain will be relieved for a few hours to a few days. Tell a health care provider about: Any allergies you have. All medicines you are taking, including vitamins, herbs, eye drops, creams, and over-the-counter medicines. Any problems you or family members have had with anesthetic medicines. Any bleeding problems you have. Any surgeries you have had. Any medical conditions you have. Whether you are pregnant or may be pregnant. Whether you are breastfeeding. What are the risks? Generally, this is a safe procedure. However, problems may occur, including: Infection. Bleeding. Allergic reactions to medicines or dyes. Damage to other structures or organs. Injection of the anesthetic into a blood vessel. This may decrease blood supply to your spinal cord and cause damage. Spread of the anesthetic to nearby nerves. This may cause temporary weakness or numbness. What happens before the procedure? Follow instructions from your health care  provider about when you should stop eating or drinking before the procedure. Plan to have a responsible adult take you home from the hospital or clinic. Ask your health care provider about: Changing or stopping your regular medicines. This is especially important if you are taking diabetes medicines or blood thinners. Taking medicines such as aspirin  and ibuprofen. These medicines can thin your blood. Do not take these medicines unless your health care provider tells you to take them. Taking over-the-counter medicines, vitamins, herbs, and supplements. Ask your health care provider: How your injection site will be marked. What steps will be taken to help prevent infection. These steps may include: Removing hair at the surgery site. Washing skin with a germ-killing soap. What happens during the procedure? An IV may be inserted into one of your veins. You will be given one or both of the following: A medicine to help you relax (sedative). A medicine to numb the area (local anesthetic). This is a short-acting anesthetic that numbs the area before the needle is passed into the facet joint. Your health care provider will pass a needle into the area around the facet joint. Your health care provider may use a type of X-ray (fluoroscopy) to look at images of your spinal cord. If fluoroscopy is used, a small amount of dye will be injected into the facet joint area. The dye will show up on fluoroscopy and help locate the exact area to inject the long-acting anesthetic. The long-acting anesthetic will be injected. A steroid may also be injected at the same time. The needle will be removed. A bandage (dressing) will be placed  over the injection site. The procedure may vary among health care providers and hospitals. What can I expect after the procedure? Your blood pressure, heart rate, breathing rate, and blood oxygen level will be monitored until you leave the hospital or clinic. You should feel less  pain in your back. You may have some soreness around the injection site. You may have a temporary increase in blood sugar due to the steroid. Follow these instructions at home: Injection site care Leave your dressing on for 24 hours. Do not take baths, swim, or use a hot tub until your health care provider approves. Ask your health care provider if you may take showers. You may only be allowed to take sponge baths. Check your injection site every day for signs of infection. Check for: Redness, swelling, or more pain. Fluid or blood. Warmth. Pus or a bad smell. If directed, put ice on the affected area. To do this: Put ice in a plastic bag. Place a towel between your skin and the bag. Leave the ice on for 20 minutes, 2-3 times a day. Remove the ice if your skin turns bright red. This is very important. If you cannot feel pain, heat, or cold, you have a greater risk of damage to the area. Activity If you were given a sedative during the procedure, it can affect you for several hours. Do not drive or operate machinery until your health care provider says that it is safe. Do not drive if the injection causes numbness in a body part needed for driving. Return to your normal activities as told by your health care provider. Ask your health care provider what activities are safe for you. General instructions Take over-the-counter and prescription medicines only as told by your health care provider. Keep a log of your pain after the procedure. Keep track of how much pain you have and when you have it. This will help your health care provider plan your future treatment. You should have relief of pain from the long-acting anesthetic for up to 3 days. After that, you may notice some pain again until the steroid starts to help, if you were given a steroid. Pain relief from the steroid may last for a few weeks. Keep all follow-up visits. This is important. Contact your health care provider if: Your  pain is not relieved or gets worse at home. You have a fever or chills. You have any signs of infection, including: Redness, swelling, or more pain. Fluid or blood. Warmth. Pus or a bad smell. You develop any numbness or weakness. You have diabetes and your blood sugar remains above 180 mg/dL. Summary Medial branch nerve block is a procedure to numb the nerves that supply the joints between your facet joints. You may have the procedure on your neck or your upper, middle, or lower spine. This procedure may be done to diagnose and relieve facet joint pain. A long-acting local anesthetic is injected close to the nerve that supplies the facet joint. A steroid may also be injected. This information is not intended to replace advice given to you by your health care provider. Make sure you discuss any questions you have with your health care provider. Document Revised: 08/31/2020 Document Reviewed: 08/31/2020  Elsevier Patient Education  2024 [employer].

## 2024-01-11 ENCOUNTER — Telehealth: Payer: Self-pay

## 2024-01-11 NOTE — Telephone Encounter (Signed)
 I have tried repeatedly to call patient to reschedule her procedure 01/28/24 due to Dr. Tanya not being here. The call can never be completed. I sent her a clinical cytogeneticist message.

## 2024-01-28 ENCOUNTER — Ambulatory Visit: Admitting: Pain Medicine

## 2024-02-09 ENCOUNTER — Encounter: Payer: Self-pay | Admitting: Pain Medicine

## 2024-02-09 ENCOUNTER — Ambulatory Visit
Admission: RE | Admit: 2024-02-09 | Discharge: 2024-02-09 | Disposition: A | Discharge status: Home / Self Care | Source: Ambulatory Visit | Attending: Pain Medicine | Admitting: Pain Medicine

## 2024-02-09 ENCOUNTER — Ambulatory Visit (HOSPITAL_BASED_OUTPATIENT_CLINIC_OR_DEPARTMENT_OTHER): Admitting: Pain Medicine

## 2024-02-09 VITALS — BP 193/118 | HR 94 | Temp 97.6°F | Resp 14 | Ht 59.0 in | Wt 160.0 lb

## 2024-02-09 DIAGNOSIS — M47816 Spondylosis without myelopathy or radiculopathy, lumbar region: Secondary | ICD-10-CM | POA: Insufficient documentation

## 2024-02-09 DIAGNOSIS — M4316 Spondylolisthesis, lumbar region: Secondary | ICD-10-CM

## 2024-02-09 DIAGNOSIS — M545 Low back pain, unspecified: Secondary | ICD-10-CM | POA: Insufficient documentation

## 2024-02-09 DIAGNOSIS — S32030S Wedge compression fracture of third lumbar vertebra, sequela: Secondary | ICD-10-CM | POA: Insufficient documentation

## 2024-02-09 DIAGNOSIS — R937 Abnormal findings on diagnostic imaging of other parts of musculoskeletal system: Secondary | ICD-10-CM | POA: Diagnosis present

## 2024-02-09 DIAGNOSIS — M47817 Spondylosis without myelopathy or radiculopathy, lumbosacral region: Secondary | ICD-10-CM | POA: Insufficient documentation

## 2024-02-09 DIAGNOSIS — G8929 Other chronic pain: Secondary | ICD-10-CM | POA: Insufficient documentation

## 2024-02-09 DIAGNOSIS — M4310 Spondylolisthesis, site unspecified: Secondary | ICD-10-CM | POA: Insufficient documentation

## 2024-02-09 DIAGNOSIS — E1142 Type 2 diabetes mellitus with diabetic polyneuropathy: Secondary | ICD-10-CM | POA: Insufficient documentation

## 2024-02-09 DIAGNOSIS — M5459 Other low back pain: Secondary | ICD-10-CM | POA: Insufficient documentation

## 2024-02-09 MED ORDER — ROPIVACAINE HCL 2 MG/ML IJ SOLN
INTRAMUSCULAR | Status: AC
  Filled 2024-02-09: qty 20

## 2024-02-09 MED ORDER — FENTANYL CITRATE (PF) 100 MCG/2ML IJ SOLN: 25.0000 ug | INTRAMUSCULAR | Status: DC | PRN

## 2024-02-09 MED ORDER — LIDOCAINE HCL 2 % IJ SOLN
20.0000 mL | Freq: Once | INTRAMUSCULAR | Status: AC
  Administered 2024-02-09: 400 mg

## 2024-02-09 MED ORDER — FENTANYL CITRATE (PF) 100 MCG/2ML IJ SOLN
INTRAMUSCULAR | Status: AC
  Filled 2024-02-09: qty 2

## 2024-02-09 MED ORDER — MIDAZOLAM HCL 5 MG/5ML IJ SOLN
INTRAMUSCULAR | Status: AC
  Filled 2024-02-09: qty 5

## 2024-02-09 MED ORDER — PENTAFLUOROPROP-TETRAFLUOROETH EX AERO
INHALATION_SPRAY | Freq: Once | CUTANEOUS | Status: AC
  Administered 2024-02-09: 30 via TOPICAL

## 2024-02-09 MED ORDER — ROPIVACAINE HCL 2 MG/ML IJ SOLN
18.0000 mL | Freq: Once | INTRAMUSCULAR | Status: AC
  Administered 2024-02-09: 18 mL via PERINEURAL

## 2024-02-09 MED ORDER — LIDOCAINE HCL (PF) 2 % IJ SOLN
INTRAMUSCULAR | Status: AC
  Filled 2024-02-09: qty 20

## 2024-02-09 MED ORDER — MIDAZOLAM HCL 5 MG/5ML IJ SOLN: 0.5000 mg | Freq: Once | INTRAMUSCULAR | Status: DC

## 2024-02-09 NOTE — Progress Notes (Signed)
 Safety precautions to be maintained throughout the outpatient stay will include: orient to surroundings, keep bed in low position, maintain call bell within reach at all times, provide assistance with transfer out of bed and ambulation.

## 2024-02-09 NOTE — Patient Instructions (Signed)
 " ______________________________________________________________________    Post-Procedure Discharge Instructions  INSTRUCTIONS Apply ice:  Purpose: This will minimize any swelling and discomfort after procedure.  When: Day of procedure, as soon as you get home. How: Fill a plastic sandwich bag with crushed ice. Cover it with a small towel and apply to injection site. How long: (15 min on, 15 min off) Apply for 15 minutes then remove x 15 minutes.  Repeat sequence on day of procedure, until you go to bed. Apply heat:  Purpose: To treat any soreness and discomfort from the procedure. When: Starting the next day after the procedure. How: Apply heat to procedure site starting the day following the procedure. How long: May continue to repeat daily, until discomfort goes away. Food intake: Start with clear liquids (like water) and advance to regular food, as tolerated.  Physical activities: Keep activities to a minimum for the first 8 hours after the procedure. After that, then as tolerated. Driving: If you have received any sedation, be responsible and do not drive. You are not allowed to drive for 24 hours after having sedation. Blood thinner: (Applies only to those taking blood thinners) You may restart your blood thinner 6 hours after your procedure. Insulin : (Applies only to Diabetic patients taking insulin ) As soon as you can eat, you may resume your normal dosing schedule. Infection prevention: Keep procedure site clean and dry. Shower daily and clean area with soap and water.  PAIN DIARY Post-procedure Pain Diary: Extremely important that this be done correctly and accurately. Recorded information will be used to determine the next step in treatment. For the purpose of accuracy, follow these rules: Evaluate only the area treated. Do not report or include pain from an untreated area. For the purpose of this evaluation, ignore all other areas of pain, except for the treated area. After your  procedure, avoid taking a long nap and attempting to complete the pain diary after you wake up. Instead, set your alarm clock to go off every hour, on the hour, for the initial 8 hours after the procedure. Document the duration of the numbing medicine, and the relief you are getting from it. Do not go to sleep and attempt to complete it later. It will not be accurate. If you received sedation, it is likely that you were given a medication that may cause amnesia. Because of this, completing the diary at a later time may cause the information to be inaccurate. This information is needed to plan your care. Follow-up appointment: Keep your post-procedure follow-up evaluation appointment after the procedure (usually 2 weeks for most procedures, 6 weeks for radiofrequencies). DO NOT FORGET to bring you pain diary with you.   EXPECT... (What should I expect to see with my procedure?) From numbing medicine (AKA: Local Anesthetics): Numbness or decrease in pain. You may also experience some weakness, which if present, could last for the duration of the local anesthetic. Onset: Full effect within 15 minutes of injected. Duration: It will depend on the type of local anesthetic used. On the average, 1 to 8 hours.  From steroids (no steroids used) From procedure: Some discomfort is to be expected once the numbing medicine wears off. This should be minimal if ice and heat are applied as instructed.  CALL IF... (When should I call?) You experience numbness and weakness that gets worse with time, as opposed to wearing off. New onset bowel or bladder incontinence. (Applies only to procedures done in the spine)  Emergency Numbers: Durning business hours (Monday -  Thursday, 8:00 AM - 4:00 PM) (Friday, 9:00 AM - 12:00 Noon): [phone] After hours: [phone] NOTE: If you are having a problem and are unable connect with, or to talk to a provider, then go to your nearest urgent care or emergency department. If  the problem is serious and urgent, please call 911. ______________________________________________________________________     "

## 2024-02-09 NOTE — Progress Notes (Signed)
 PROVIDER NOTE: Interpretation of information contained herein should be left to medically-trained personnel. Specific patient instructions are provided elsewhere under Patient Instructions section of medical record. This document was created in part using STT-dictation technology, any transcriptional errors that may result from this process are unintentional.  Patient: Charlene [name] Type: Established DOB: 18-Jul-1960 MRN: [id number] PCP: Sadie Manna, MD  Service: Procedure DOS: 02/09/2024 Setting: Ambulatory Location: Ambulatory outpatient facility Delivery: Face-to-face Provider: Eric DELENA Como, MD Specialty: Interventional Pain Management Specialty designation: 09 Location: Outpatient facility Ref. Prov.: Sadie Manna, MD       Interventional Therapy   Type: Lumbar Facet, Medial Branch Block(s) (w/o STEROIDS) R6L3  Laterality: Bilateral  Level: L3, L4, L5, and S1 Medial Branch/Dorsal Rami Level(s). Injecting these levels blocks the L4-5 and L5-S1 lumbar facet joints. Imaging: Fluoroscopic guidance Spinal ([id number]) Anesthesia: Local anesthesia (1-2% Lidocaine ) Anxiolysis: None Declined                    Analgesia: No Sedation                       DOS: 02/09/2024 Performed by: Eric DELENA Como, MD  Primary Purpose: Diagnostic/Therapeutic Indications: Low back pain severe enough to impact quality of life or function. 1. Chronic low back pain (Bilateral) w/o sciatica   2. Spondylosis without myelopathy or radiculopathy, lumbosacral region   3. Lumbar facet joint pain   4. Lumbar facet arthropathy   5. Lumbar facet syndrome   6. Lumbar facet hypertrophy   7. Low back pain of over 3 months duration   8. Grade 1 Retrolisthesis of L2/L3   9. 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)   10. Closed compression fracture of L3 lumbar vertebra, sequela   11. Arthropathy of lumbar facet joint   12. Abnormal x-ray of lumbar spine (03/16/2021)   13. DM type 2  with diabetic peripheral neuropathy (HCC)    NAS-11 Pain score:   Pre-procedure: 2 /10   Post-procedure: 0-No pain/10     Position / Prep / Materials:  Position: Prone  Prep solution: ChloraPrep (2% chlorhexidine gluconate and 70% isopropyl alcohol) Area Prepped: Posterolateral Lumbosacral Spine (Wide prep: From the lower border of the scapula down to the end of the tailbone and from flank to flank.)  Materials:  Tray: Block Needle(s):  Type: Spinal  Gauge (G): 22  Length: 3.5-in Qty: 4     H&P (Pre-op Assessment):  Ms. Crispen is a 64 y.o. (year old), female patient, seen today for interventional treatment. She  has a past surgical history that includes arthroscopic rotator cuff; Colonoscopy; Abdominal hysterectomy; endosopic sinus; Cataract extraction w/PHACO (Right, 11/25/2017); and Eye surgery. Ms. Louthan has a current medication list which includes the following prescription(s): albuterol, alendronate, amlodipine , baclofen , carvedilol , cholecalciferol , freestyle libre sensor system, dicyclomine , duloxetine , empagliflozin, glucagon hcl (diagnostic), hydralazine, insulin  aspart flexpen, toujeo  max solostar, insulin  lispro, losartan, magnesium , meloxicam , metformin, nortriptyline, ondansetron , pantoprazole , polyethylene glycol, pravastatin, pregabalin, sucralfate, and tramadol . Her primarily concern today is the Back Pain (lower)  Initial Vital Signs:  Pulse/HCG Rate: 94ECG Heart Rate: 94 Temp: 97.6 F (36.4 C) Resp: 16 BP: (!) 183/92 (patient did not take BP meds this am) SpO2: 100 %  BMI: Estimated body mass index is 32.32 kg/m as calculated from the following:   Height as of this encounter: 4' 11 (1.499 m).   Weight as of this encounter: 160 lb (72.6 kg).  Risk Assessment: Allergies: Reviewed. She is allergic to  semaglutide.  Allergy Precautions: None required Coagulopathies: Reviewed. None identified.  Blood-thinner therapy: None at this time Active Infection(s):  Reviewed. None identified. Ms. Adney is afebrile  Site Confirmation: Ms. Maiorana was asked to confirm the procedure and laterality before marking the site Procedure checklist: Completed Consent: Before the procedure and under the influence of no sedative(s), amnesic(s), or anxiolytics, the patient was informed of the treatment options, risks and possible complications. To fulfill our ethical and legal obligations, as recommended by the American Medical Association's Code of Ethics, I have informed the patient of my clinical impression; the nature and purpose of the treatment or procedure; the risks, benefits, and possible complications of the intervention; the alternatives, including doing nothing; the risk(s) and benefit(s) of the alternative treatment(s) or procedure(s); and the risk(s) and benefit(s) of doing nothing. The patient was provided information about the general risks and possible complications associated with the procedure. These may include, but are not limited to: failure to achieve desired goals, infection, bleeding, organ or nerve damage, allergic reactions, paralysis, and death. In addition, the patient was informed of those risks and complications associated to Spine-related procedures, such as failure to decrease pain; infection (i.e.: Meningitis, epidural or intraspinal abscess); bleeding (i.e.: epidural hematoma, subarachnoid hemorrhage, or any other type of intraspinal or peri-dural bleeding); organ or nerve damage (i.e.: Any type of peripheral nerve, nerve root, or spinal cord injury) with subsequent damage to sensory, motor, and/or autonomic systems, resulting in permanent pain, numbness, and/or weakness of one or several areas of the body; allergic reactions; (i.e.: anaphylactic reaction); and/or death. Furthermore, the patient was informed of those risks and complications associated with the medications. These include, but are not limited to: allergic reactions (i.e.: anaphylactic  or anaphylactoid reaction(s)); adrenal axis suppression; blood sugar elevation that in diabetics may result in ketoacidosis or comma; water retention that in patients with history of congestive heart failure may result in shortness of breath, pulmonary edema, and decompensation with resultant heart failure; weight gain; swelling or edema; medication-induced neural toxicity; particulate matter embolism and blood vessel occlusion with resultant organ, and/or nervous system infarction; and/or aseptic necrosis of one or more joints. Finally, the patient was informed that Medicine is not an exact science; therefore, there is also the possibility of unforeseen or unpredictable risks and/or possible complications that may result in a catastrophic outcome. The patient indicated having understood very clearly. We have given the patient no guarantees and we have made no promises. Enough time was given to the patient to ask questions, all of which were answered to the patient's satisfaction. Ms. Mccarter has indicated that she wanted to continue with the procedure. Attestation: I, the ordering provider, attest that I have discussed with the patient the benefits, risks, side-effects, alternatives, likelihood of achieving goals, and potential problems during recovery for the procedure that I have provided informed consent. Date  Time: 02/09/2024 11:35 AM  Pre-Procedure Preparation:  Monitoring: As per clinic protocol. Respiration, ETCO2, SpO2, BP, heart rate and rhythm monitor placed and checked for adequate function Safety Precautions: Patient was assessed for positional comfort and pressure points before starting the procedure. Time-out: I initiated and conducted the Time-out before starting the procedure, as per protocol. The patient was asked to participate by confirming the accuracy of the Time Out information. Verification of the correct person, site, and procedure were performed and confirmed by me, the nursing  staff, and the patient. Time-out conducted as per Joint Commission's Universal Protocol (UP.01.01.01). Time: 1211 Start Time: 1212 hrs.  Description of Procedure:          Laterality: (see above) Targeted Levels: (see above)  Safety Precautions: Aspiration looking for blood return was conducted prior to all injections. At no point did we inject any substances, as a needle was being advanced. Before injecting, the patient was told to immediately notify me if she was experiencing any new onset of ringing in the ears, or metallic taste in the mouth. No attempts were made at seeking any paresthesias. Safe injection practices and needle disposal techniques used. Medications properly checked for expiration dates. SDV (single dose vial) medications used. After the completion of the procedure, all disposable equipment used was discarded in the proper designated medical waste containers. Local Anesthesia: Protocol guidelines were followed. The patient was positioned over the fluoroscopy table. The area was prepped in the usual manner. The time-out was completed. The target area was identified using fluoroscopy. A 12-in long, straight, sterile hemostat was used with fluoroscopic guidance to locate the targets for each level blocked. Once located, the skin was marked with an approved surgical skin marker. Once all sites were marked, the skin (epidermis, dermis, and hypodermis), as well as deeper tissues (fat, connective tissue and muscle) were infiltrated with a small amount of a short-acting local anesthetic, loaded on a 10cc syringe with a 25G, 1.5-in  Needle. An appropriate amount of time was allowed for local anesthetics to take effect before proceeding to the next step. Local Anesthetic: Lidocaine  2.0% The unused portion of the local anesthetic was discarded in the proper designated containers. Technical description of process:  Medial Branch  Dorsal Rami Nerve Block (MBB):  Neuroanatomy note: Each  lumbar facet joint receives dual innervation from medial branches arising from the posterior primary rami at the same level and one level above. The target for each lumbar medial branch is the junction of the ipsilateral superior articular and transverse process of the lower vertebral body. (i.e.: The L4-L5 facet joint is innervated by the L4 medial branch [located at L5] and the L3 medial branch [located at L4]. Blocking the L4 Medial Branch is therefore achieved by injecting at the junction of the ipsilateral superior articular and transverse process of the lower vertebral body [L5].).  Exception: The exception to the above rule is the L5-S1 facet joint which has triple innervation requiring the L4 medial branch, as well as the L5 and the S1 Dorsal Rami(s) to be blocked to fully denervate the joint.  Under fluoroscopic guidance, a needle was inserted until contact was made with os over the target area. After negative aspiration, 0.5 mL of the nerve block solution was injected without difficulty or complication. Paresthesia were avoided during injection. The needle(s) were removed intact and without complication.  Once the entire procedure was completed, the treated area was cleaned, making sure to leave some of the prepping solution back to take advantage of its long term bactericidal properties.         Illustration of the posterior view of the lumbar spine and the posterior neural structures. Laminae of L2 through S1 are labeled. DPRL5, dorsal primary ramus of L5; DPRS1, dorsal primary ramus of S1; DPR3, dorsal primary ramus of L3; FJ, facet (zygapophyseal) joint L3-L4; I, inferior articular process of L4; LB1, lateral branch of dorsal primary ramus of L1; IAB, inferior articular branches from L3 medial branch (supplies L4-L5 facet joint); IBP, intermediate branch plexus; MB3, medial branch of dorsal primary ramus of L3; NR3, third lumbar nerve root; S, superior articular process of  L5; SAB, superior  articular branches from L4 (supplies L4-5 facet joint also); TP3, transverse process of L3.   Facet Joint Innervation (* possible contribution)  L1-2 T12, L1 (L2*)  Medial Branch  L2-3 L1, L2 (L3*)                     L3-4 L2, L3 (L4*)                     L4-5 L3, L4 (L5*)                     L5-S1 L4, L5, S1                        Vitals:   02/09/24 1210 02/09/24 1215 02/09/24 1220 02/09/24 1223  BP: (!) 206/117 (!) 191/114 (!) 187/114 (!) 193/118  Pulse:      Resp: 12 12 10 14   Temp:      SpO2: 100% 100% 100% 100%  Weight:      Height:         End Time: 1222 hrs.  Imaging Guidance (Spinal):         Type of Imaging Technique: Fluoroscopy Guidance (Spinal) Indication(s): Fluoroscopy guidance for needle placement to enhance accuracy in procedures requiring precise needle localization for targeted delivery of medication in or near specific anatomical locations not easily accessible without such real-time imaging assistance. Exposure Time: Please see nurses notes. Contrast: None used. Fluoroscopic Guidance: I was personally present during the use of fluoroscopy. Tunnel Vision Technique used to obtain the best possible view of the target area. Parallax error corrected before commencing the procedure. Direction-depth-direction technique used to introduce the needle under continuous pulsed fluoroscopy. Once target was reached, antero-posterior, oblique, and lateral fluoroscopic projection used confirm needle placement in all planes. Images permanently stored in EMR. Interpretation: No contrast injected. I personally interpreted the imaging intraoperatively. Adequate needle placement confirmed in multiple planes. Permanent images saved into the patient's record.  Post-operative Assessment:  Post-procedure Vital Signs:  Pulse/HCG Rate: 9496 Temp: 97.6 F (36.4 C) Resp: 14 BP: (!) 193/118 SpO2: 100 %  EBL: None  Complications: No immediate post-treatment complications  observed by team, or reported by patient.  Note: The patient tolerated the entire procedure well. A repeat set of vitals were taken after the procedure and the patient was kept under observation following institutional policy, for this type of procedure. Post-procedural neurological assessment was performed, showing return to baseline, prior to discharge. The patient was provided with post-procedure discharge instructions, including a section on how to identify potential problems. Should any problems arise concerning this procedure, the patient was given instructions to immediately contact us , at any time, without hesitation. In any case, we plan to contact the patient by telephone for a follow-up status report regarding this interventional procedure.  Comments:  No additional relevant information.  Plan of Care (POC)  Orders:  Orders Placed This Encounter  Procedures   LUMBAR FACET(MEDIAL BRANCH NERVE BLOCK) MBNB    Scheduling Instructions:     Procedure: Lumbar facet block (AKA.: Lumbosacral medial branch nerve block)     Side: Bilateral     Level: L3-4, L4-5, and L5-S1 Facets (L2, L3, L4, L5, and S1 Medial Branch Nerves)     Sedation: Patient's choice.     Date: 02/09/2024    Where will this procedure be performed?:   [facility]   [facility]  C-ARM 1-60 MIN NO REPORT    Intraoperative interpretation by procedural physician at [facility].    Standing Status:   Standing    Number of Occurrences:   1    Reason for exam::   Assistance in needle guidance and placement for procedures requiring needle placement in or near specific anatomical locations not easily accessible without such assistance.   Informed Consent Details: Physician/Practitioner Attestation; Transcribe to consent form and obtain patient signature    Nursing Order: Transcribe to consent form and obtain patient signature. Note: Always confirm laterality of pain with Ms. Chesley, before procedure.     Physician/Practitioner attestation of informed consent for procedure/surgical case:   I, the physician/practitioner, attest that I have discussed with the patient the benefits, risks, side effects, alternatives, likelihood of achieving goals and potential problems during recovery for the procedure that I have provided informed consent.    Procedure:   Lumbar Facet Block  under fluoroscopic guidance    Physician/Practitioner performing the procedure:   Wildon Cuevas A. Tanya MD    Indication/Reason:   Low Back Pain, with our without leg pain, due to Facet Joint Arthralgia (Joint Pain) Spondylosis (Arthritis of the Spine), without myelopathy or radiculopathy (Nerve Damage).   Provide equipment / supplies at bedside    Procedure tray: Block Tray (Disposable  single use) Skin infiltration needle: Regular 1.5-in, 25-G, (x1) Block Needle type: Spinal Amount/quantity: 4 Size: Regular (3.5-inch) Gauge: 22G    Standing Status:   Standing    Number of Occurrences:   1    Specify:   Block Tray     Chronic Opioid Analgesic:  Analgesic: Tramadol  50 mg, 1 tab PO QD. ABNORMAL UDS (09/03/2020) (+) for COCAINE. MME: 5 mg/day    Medications ordered for procedure: Meds ordered this encounter  Medications   lidocaine  (XYLOCAINE ) 2 % (with pres) injection 400 mg   pentafluoroprop-tetrafluoroeth (GEBAUERS) aerosol   DISCONTD: midazolam  (VERSED ) 5 MG/5ML injection 0.5-2 mg    Make sure Flumazenil is available in the pyxis when using this medication. If oversedation occurs, administer 0.2 mg IV over 15 sec. If after 45 sec no response, administer 0.2 mg again over 1 min; may repeat at 1 min intervals; not to exceed 4 doses (1 mg)   DISCONTD: fentaNYL  (SUBLIMAZE ) injection 25-50 mcg    Make sure Narcan  is available in the pyxis when using this medication. In the event of respiratory depression (RR< 8/min): Titrate NARCAN  (naloxone ) in increments of 0.1 to 0.2 mg IV at 2-3 minute intervals, until desired degree  of reversal.   ropivacaine  (PF) 2 mg/mL (0.2%) (NAROPIN ) injection 18 mL   Medications administered: We administered lidocaine , pentafluoroprop-tetrafluoroeth, and ropivacaine  (PF) 2 mg/mL (0.2%).  See the medical record for exact dosing, route, and time of administration.    Interventional Therapies  Risk Factors  Considerations:   NOTE: UNCONTROLLED IDDM (NO STEROIDS)  ABNORMAL UDS (09/03/2020) (+) for COCAINE       Planned  Pending:  (NO STEROIDS)  Therapeutic bilateral lumbar facet MBB R6L3    Under consideration:   Possible bilateral lumbar facet RFA #1  Diagnostic bilateral genicular NB #1    Completed:   Therapeutic right IA Monovisc knee injection x1 (10/20/2023) (0/0/0/0) (RXN to Monovisc) (dural Lane recommended by orthopedics) Diagnostic right lumbar facet MBB x6 (09/22/2023) (100/100/100/100) (no steroid) (Diagnostic - local anesthetics only.)  Diagnostic left lumbar facet MBB x3 (09/22/2023) (100/100/100/100) (no steroid) (Diagnostic - local anesthetics only.)  Therapeutic left L3-4 LESI x1 (  02/11/2022) (100/100/75/100)  Therapeutic left L4-5 LESI x4 (09/21/2018) (100/100/50/70)  Therapeutic left L5 TFESI x4 (09/21/2018) (100/100/50/70)  Therapeutic left L5-S1 LESI x2 (02/25/2018) (100/100/100/100)  Therapeutic bilateral Hyalgan knee inj. x5 (02/25/2018) (100/100/100 x2 weeks/<50)  Therapeutic left IA steroid knee inj. x1 (11/23/2017) (100/100/0/0)  Therapeutic left IA Gelsyn-3 knee inj. x4 (06/30/2023) (100/100/50/50)  Therapeutic left IA Monovisc knee inj. x1 (03/18/2022) (100/100/50/50)  (07/14/2023) I have recommended to the patient to consider the use of a TENS unit.  (Information provided)    Therapeutic  Palliative (PRN) options:   (NO PRN PROCEDURES W/O MD F2F EVALUATION)         Follow-up plan:   Return in about 2 weeks (around 02/23/2024) for (Face2F), (PPE).     Recent Visits Date Type Provider Dept  12/21/23 Office Visit Patel, Seema K, NP [facility]  11/16/23 Office Visit Tanya Glisson, MD [facility]  Showing recent visits within past 90 days and meeting all other requirements Today's Visits Date Type Provider Dept  02/09/24 Procedure visit Tanya Glisson, MD [facility]  Showing today's visits and meeting all other requirements Future Appointments Date Type Provider Dept  02/29/24 Appointment Tanya Glisson, MD [facility]  Showing future appointments within next 90 days and meeting all other requirements   Disposition: Discharge home  Discharge (Date  Time): 02/09/2024; 1231 hrs.   Primary Care Physician: Sadie Manna, MD Location: [facility] Note by: Glisson DELENA Tanya, MD (TTS technology used. I apologize for any typographical errors that were not detected and corrected.) Date: 02/09/2024; Time: 12:37 PM  Disclaimer:  Medicine is not an visual merchandiser. The only guarantee in medicine is that nothing is guaranteed. It is important to note that the decision to proceed with this intervention was based on the information collected from the patient. The Data and conclusions were drawn from the patient's questionnaire, the interview, and the physical examination. Because the information was provided in large part by the patient, it cannot be guaranteed that it has not been purposely or unconsciously manipulated. Every effort has been made to obtain as much relevant data as possible for this evaluation. It is important to note that the conclusions that lead to this procedure are derived in large part from the available data. Always take into account that the treatment will also be dependent on availability of resources and existing treatment guidelines, considered by other Pain Management Practitioners as being common knowledge and practice, at the time of the intervention. For Medico-Legal purposes, it is also important to point out that variation in  procedural techniques and pharmacological choices are the acceptable norm. The indications, contraindications, technique, and results of the above procedure should only be interpreted and judged by a Board-Certified Interventional Pain Specialist with extensive familiarity and expertise in the same exact procedure and technique.

## 2024-02-10 ENCOUNTER — Telehealth: Payer: Self-pay

## 2024-02-10 NOTE — Telephone Encounter (Signed)
 No issues post-procedure.

## 2024-02-29 ENCOUNTER — Ambulatory Visit: Admitting: Pain Medicine
